# Patient Record
Sex: Male | Born: 1940 | Race: White | Hispanic: No | Marital: Married | Smoking: Former smoker
Health system: Southern US, Community
[De-identification: ages and names within clinical notes are randomized; demographics above are authoritative.]

## PROBLEM LIST (undated history)

## (undated) DIAGNOSIS — I10 Essential (primary) hypertension: Secondary | ICD-10-CM

## (undated) DIAGNOSIS — F419 Anxiety disorder, unspecified: Secondary | ICD-10-CM

## (undated) DIAGNOSIS — K219 Gastro-esophageal reflux disease without esophagitis: Secondary | ICD-10-CM

## (undated) DIAGNOSIS — M1990 Unspecified osteoarthritis, unspecified site: Secondary | ICD-10-CM

## (undated) DIAGNOSIS — T884XXA Failed or difficult intubation, initial encounter: Secondary | ICD-10-CM

## (undated) DIAGNOSIS — K635 Polyp of colon: Secondary | ICD-10-CM

## (undated) DIAGNOSIS — E119 Type 2 diabetes mellitus without complications: Secondary | ICD-10-CM

## (undated) DIAGNOSIS — G4730 Sleep apnea, unspecified: Secondary | ICD-10-CM

## (undated) DIAGNOSIS — E785 Hyperlipidemia, unspecified: Secondary | ICD-10-CM

## (undated) DIAGNOSIS — F329 Major depressive disorder, single episode, unspecified: Secondary | ICD-10-CM

## (undated) DIAGNOSIS — E7800 Pure hypercholesterolemia, unspecified: Secondary | ICD-10-CM

## (undated) DIAGNOSIS — J449 Chronic obstructive pulmonary disease, unspecified: Secondary | ICD-10-CM

## (undated) DIAGNOSIS — J45909 Unspecified asthma, uncomplicated: Secondary | ICD-10-CM

## (undated) DIAGNOSIS — F32A Depression, unspecified: Secondary | ICD-10-CM

## (undated) HISTORY — PX: TONSILLECTOMY: SUR1361

## (undated) HISTORY — PX: BRONCHOSCOPY: SUR163

## (undated) HISTORY — PX: ROTATOR CUFF REPAIR: SHX139

## (undated) HISTORY — DX: Major depressive disorder, single episode, unspecified: F32.9

## (undated) HISTORY — DX: Anxiety disorder, unspecified: F41.9

## (undated) HISTORY — PX: COLOSTOMY: SHX63

## (undated) HISTORY — DX: Unspecified osteoarthritis, unspecified site: M19.90

## (undated) HISTORY — DX: Depression, unspecified: F32.A

## (undated) HISTORY — PX: CARDIAC CATHETERIZATION: SHX172

---
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
## 2009-11-07 ENCOUNTER — Ambulatory Visit: Payer: Self-pay | Admitting: Family Medicine

## 2011-12-14 DIAGNOSIS — J42 Unspecified chronic bronchitis: Secondary | ICD-10-CM | POA: Insufficient documentation

## 2013-06-14 DIAGNOSIS — J441 Chronic obstructive pulmonary disease with (acute) exacerbation: Secondary | ICD-10-CM | POA: Insufficient documentation

## 2013-12-26 HISTORY — PX: THUMB ARTHROSCOPY: SHX2509

## 2014-09-17 ENCOUNTER — Ambulatory Visit: Payer: Self-pay | Admitting: Ophthalmology

## 2014-10-08 ENCOUNTER — Ambulatory Visit: Payer: Self-pay | Admitting: Ophthalmology

## 2015-02-13 DIAGNOSIS — G4730 Sleep apnea, unspecified: Secondary | ICD-10-CM

## 2015-02-13 DIAGNOSIS — G4733 Obstructive sleep apnea (adult) (pediatric): Secondary | ICD-10-CM | POA: Insufficient documentation

## 2015-02-17 ENCOUNTER — Ambulatory Visit: Payer: Self-pay | Admitting: Internal Medicine

## 2015-06-24 ENCOUNTER — Encounter: Payer: Self-pay | Admitting: *Deleted

## 2015-06-24 ENCOUNTER — Emergency Department: Payer: Medicare Other

## 2015-06-24 ENCOUNTER — Inpatient Hospital Stay: Payer: Medicare Other

## 2015-06-24 ENCOUNTER — Inpatient Hospital Stay
Admission: EM | Admit: 2015-06-24 | Discharge: 2015-06-28 | DRG: 190 | Disposition: A | Discharge status: Home / Self Care | Payer: Medicare Other | Attending: Internal Medicine | Admitting: Internal Medicine

## 2015-06-24 DIAGNOSIS — R0600 Dyspnea, unspecified: Secondary | ICD-10-CM

## 2015-06-24 DIAGNOSIS — R079 Chest pain, unspecified: Secondary | ICD-10-CM | POA: Diagnosis present

## 2015-06-24 DIAGNOSIS — E780 Pure hypercholesterolemia: Secondary | ICD-10-CM | POA: Diagnosis present

## 2015-06-24 DIAGNOSIS — R000 Tachycardia, unspecified: Secondary | ICD-10-CM

## 2015-06-24 DIAGNOSIS — Z882 Allergy status to sulfonamides status: Secondary | ICD-10-CM | POA: Diagnosis not present

## 2015-06-24 DIAGNOSIS — E785 Hyperlipidemia, unspecified: Secondary | ICD-10-CM | POA: Diagnosis present

## 2015-06-24 DIAGNOSIS — Z87891 Personal history of nicotine dependence: Secondary | ICD-10-CM | POA: Diagnosis not present

## 2015-06-24 DIAGNOSIS — Z7982 Long term (current) use of aspirin: Secondary | ICD-10-CM

## 2015-06-24 DIAGNOSIS — J441 Chronic obstructive pulmonary disease with (acute) exacerbation: Principal | ICD-10-CM | POA: Diagnosis present

## 2015-06-24 DIAGNOSIS — J45909 Unspecified asthma, uncomplicated: Secondary | ICD-10-CM | POA: Diagnosis present

## 2015-06-24 DIAGNOSIS — K219 Gastro-esophageal reflux disease without esophagitis: Secondary | ICD-10-CM | POA: Diagnosis present

## 2015-06-24 DIAGNOSIS — J9600 Acute respiratory failure, unspecified whether with hypoxia or hypercapnia: Secondary | ICD-10-CM | POA: Diagnosis present

## 2015-06-24 DIAGNOSIS — R0602 Shortness of breath: Secondary | ICD-10-CM

## 2015-06-24 DIAGNOSIS — I10 Essential (primary) hypertension: Secondary | ICD-10-CM | POA: Diagnosis present

## 2015-06-24 DIAGNOSIS — E119 Type 2 diabetes mellitus without complications: Secondary | ICD-10-CM | POA: Diagnosis present

## 2015-06-24 HISTORY — DX: Unspecified asthma, uncomplicated: J45.909

## 2015-06-24 HISTORY — DX: Pure hypercholesterolemia, unspecified: E78.00

## 2015-06-24 HISTORY — DX: Type 2 diabetes mellitus without complications: E11.9

## 2015-06-24 HISTORY — DX: Chronic obstructive pulmonary disease, unspecified: J44.9

## 2015-06-24 HISTORY — DX: Hyperlipidemia, unspecified: E78.5

## 2015-06-24 HISTORY — DX: Gastro-esophageal reflux disease without esophagitis: K21.9

## 2015-06-24 HISTORY — DX: Essential (primary) hypertension: I10

## 2015-06-24 LAB — BASIC METABOLIC PANEL
Anion gap: 11 (ref 5–15)
BUN: 16 mg/dL (ref 6–20)
CO2: 24 mmol/L (ref 22–32)
Calcium: 8.4 mg/dL — ABNORMAL LOW (ref 8.9–10.3)
Chloride: 106 mmol/L (ref 101–111)
Creatinine, Ser: 1.29 mg/dL — ABNORMAL HIGH (ref 0.61–1.24)
GFR, EST NON AFRICAN AMERICAN: 54 mL/min — AB (ref >60)
Glucose, Bld: 101 mg/dL — ABNORMAL HIGH (ref 65–99)
POTASSIUM: 3.5 mmol/L (ref 3.5–5.1)
SODIUM: 141 mmol/L (ref 135–145)

## 2015-06-24 LAB — CBC
HEMATOCRIT: 41.1 % (ref 40.0–52.0)
HEMOGLOBIN: 13.8 g/dL (ref 13.0–18.0)
MCH: 30.5 pg (ref 26.0–34.0)
MCHC: 33.5 g/dL (ref 32.0–36.0)
MCV: 91.2 fL (ref 80.0–100.0)
Platelets: 218 10*3/uL (ref 150–440)
RBC: 4.51 MIL/uL (ref 4.40–5.90)
RDW: 13.7 % (ref 11.5–14.5)
WBC: 6.3 10*3/uL (ref 3.8–10.6)

## 2015-06-24 LAB — TROPONIN I
Troponin I: <0.03 ng/mL (ref <0.031)
Troponin I: <0.03 ng/mL (ref <0.031)

## 2015-06-24 LAB — GLUCOSE, CAPILLARY: GLUCOSE-CAPILLARY: 146 mg/dL — AB (ref 65–99)

## 2015-06-24 LAB — FIBRIN DERIVATIVES D-DIMER (ARMC ONLY): FIBRIN DERIVATIVES D-DIMER (ARMC): 255 (ref 0–499)

## 2015-06-24 MED ORDER — LEVOFLOXACIN IN D5W 750 MG/150ML IV SOLN
750.0000 mg | INTRAVENOUS | Status: DC
  Administered 2015-06-25 (×2): 750 mg via INTRAVENOUS
  Filled 2015-06-24 (×3): qty 150

## 2015-06-24 MED ORDER — SODIUM CHLORIDE 0.9 % IV BOLUS (SEPSIS)
1000.0000 mL | INTRAVENOUS | Status: AC
  Administered 2015-06-24: 1000 mL via INTRAVENOUS

## 2015-06-24 MED ORDER — FAMOTIDINE 20 MG PO TABS
20.0000 mg | ORAL_TABLET | Freq: Two times a day (BID) | ORAL | Status: DC
  Administered 2015-06-24 – 2015-06-28 (×8): 20 mg via ORAL
  Filled 2015-06-24 (×8): qty 1

## 2015-06-24 MED ORDER — METHYLPREDNISOLONE SODIUM SUCC 125 MG IJ SOLR
125.0000 mg | Freq: Once | INTRAMUSCULAR | Status: AC
  Administered 2015-06-24: 125 mg via INTRAVENOUS

## 2015-06-24 MED ORDER — INSULIN ASPART 100 UNIT/ML ~~LOC~~ SOLN
0.0000 [IU] | Freq: Every day | SUBCUTANEOUS | Status: DC
  Administered 2015-06-25: 4 [IU] via SUBCUTANEOUS
  Filled 2015-06-24: qty 4

## 2015-06-24 MED ORDER — INSULIN ASPART 100 UNIT/ML ~~LOC~~ SOLN
0.0000 [IU] | Freq: Three times a day (TID) | SUBCUTANEOUS | Status: DC
  Administered 2015-06-25: 8 [IU] via SUBCUTANEOUS
  Administered 2015-06-25: 3 [IU] via SUBCUTANEOUS
  Administered 2015-06-25: 5 [IU] via SUBCUTANEOUS
  Administered 2015-06-26: 11 [IU] via SUBCUTANEOUS
  Administered 2015-06-26: 3 [IU] via SUBCUTANEOUS
  Filled 2015-06-24: qty 11
  Filled 2015-06-24: qty 5
  Filled 2015-06-24: qty 3
  Filled 2015-06-24: qty 8
  Filled 2015-06-24: qty 3

## 2015-06-24 MED ORDER — IPRATROPIUM-ALBUTEROL 0.5-2.5 (3) MG/3ML IN SOLN
RESPIRATORY_TRACT | Status: AC
  Administered 2015-06-24: 3 mL via RESPIRATORY_TRACT
  Filled 2015-06-24: qty 3

## 2015-06-24 MED ORDER — GLIMEPIRIDE 2 MG PO TABS
2.0000 mg | ORAL_TABLET | Freq: Every day | ORAL | Status: DC
  Administered 2015-06-24 – 2015-06-28 (×5): 2 mg via ORAL
  Filled 2015-06-24 (×5): qty 1

## 2015-06-24 MED ORDER — ADULT MULTIVITAMIN W/MINERALS CH
1.0000 | ORAL_TABLET | Freq: Every day | ORAL | Status: DC
  Administered 2015-06-24 – 2015-06-28 (×5): 1 via ORAL
  Filled 2015-06-24 (×5): qty 1

## 2015-06-24 MED ORDER — SIMVASTATIN 20 MG PO TABS
20.0000 mg | ORAL_TABLET | Freq: Every day | ORAL | Status: DC
  Administered 2015-06-24 – 2015-06-28 (×5): 20 mg via ORAL
  Filled 2015-06-24 (×5): qty 1

## 2015-06-24 MED ORDER — POLYETHYLENE GLYCOL 3350 17 G PO PACK
17.0000 g | PACK | Freq: Every day | ORAL | Status: DC | PRN
  Administered 2015-06-26: 17 g via ORAL
  Filled 2015-06-24: qty 1

## 2015-06-24 MED ORDER — ALBUTEROL SULFATE (2.5 MG/3ML) 0.083% IN NEBU
2.5000 mg | INHALATION_SOLUTION | Freq: Four times a day (QID) | RESPIRATORY_TRACT | Status: DC | PRN
  Administered 2015-06-25: 2.5 mg via RESPIRATORY_TRACT
  Filled 2015-06-24: qty 3

## 2015-06-24 MED ORDER — HYDROCODONE-ACETAMINOPHEN 5-325 MG PO TABS: 1.0000 | ORAL_TABLET | ORAL | Status: DC | PRN

## 2015-06-24 MED ORDER — METHYLPREDNISOLONE SODIUM SUCC 125 MG IJ SOLR
INTRAMUSCULAR | Status: AC
  Filled 2015-06-24: qty 2

## 2015-06-24 MED ORDER — ASPIRIN 81 MG PO CHEW
CHEWABLE_TABLET | ORAL | Status: AC
  Filled 2015-06-24: qty 4

## 2015-06-24 MED ORDER — ACETAMINOPHEN 325 MG PO TABS
650.0000 mg | ORAL_TABLET | Freq: Four times a day (QID) | ORAL | Status: DC | PRN
Start: 2015-06-24 — End: 2015-06-28

## 2015-06-24 MED ORDER — ONDANSETRON HCL 4 MG/2ML IJ SOLN
4.0000 mg | Freq: Once | INTRAMUSCULAR | Status: AC
  Administered 2015-06-24: 4 mg via INTRAVENOUS

## 2015-06-24 MED ORDER — ASPIRIN 81 MG PO CHEW
324.0000 mg | CHEWABLE_TABLET | Freq: Once | ORAL | Status: AC
  Administered 2015-06-24: 324 mg via ORAL

## 2015-06-24 MED ORDER — GLUCOSAMINE-CHONDROITIN 500-400 MG PO TABS: 1.0000 | ORAL_TABLET | Freq: Every day | ORAL | Status: DC

## 2015-06-24 MED ORDER — LORATADINE 10 MG PO TABS
10.0000 mg | ORAL_TABLET | Freq: Every day | ORAL | Status: DC
  Administered 2015-06-24 – 2015-06-28 (×5): 10 mg via ORAL
  Filled 2015-06-24 (×5): qty 1

## 2015-06-24 MED ORDER — IPRATROPIUM-ALBUTEROL 0.5-2.5 (3) MG/3ML IN SOLN
3.0000 mL | Freq: Once | RESPIRATORY_TRACT | Status: AC
  Administered 2015-06-24: 3 mL via RESPIRATORY_TRACT

## 2015-06-24 MED ORDER — ONDANSETRON HCL 4 MG/2ML IJ SOLN: 4.0000 mg | Freq: Four times a day (QID) | INTRAMUSCULAR | Status: DC | PRN

## 2015-06-24 MED ORDER — METOPROLOL SUCCINATE ER 25 MG PO TB24
25.0000 mg | ORAL_TABLET | Freq: Every day | ORAL | Status: DC
  Administered 2015-06-24 – 2015-06-28 (×5): 25 mg via ORAL
  Filled 2015-06-24 (×5): qty 1

## 2015-06-24 MED ORDER — ACETAMINOPHEN 650 MG RE SUPP: 650.0000 mg | Freq: Four times a day (QID) | RECTAL | Status: DC | PRN

## 2015-06-24 MED ORDER — PANTOPRAZOLE SODIUM 40 MG PO TBEC
40.0000 mg | DELAYED_RELEASE_TABLET | Freq: Every day | ORAL | Status: DC
  Administered 2015-06-24 – 2015-06-28 (×5): 40 mg via ORAL
  Filled 2015-06-24 (×5): qty 1

## 2015-06-24 MED ORDER — ASPIRIN 81 MG PO CHEW
81.0000 mg | CHEWABLE_TABLET | Freq: Every day | ORAL | Status: DC
  Administered 2015-06-25 – 2015-06-28 (×4): 81 mg via ORAL
  Filled 2015-06-24 (×4): qty 1

## 2015-06-24 MED ORDER — HYDROCODONE-HOMATROPINE 5-1.5 MG/5ML PO SYRP
5.0000 mL | ORAL_SOLUTION | Freq: Four times a day (QID) | ORAL | Status: DC | PRN
  Administered 2015-06-26: 5 mL via ORAL
  Filled 2015-06-24: qty 5

## 2015-06-24 MED ORDER — TRIAMTERENE-HCTZ 37.5-25 MG PO CAPS
1.0000 | ORAL_CAPSULE | Freq: Every day | ORAL | Status: DC
  Filled 2015-06-24 (×2): qty 1

## 2015-06-24 MED ORDER — ONDANSETRON HCL 4 MG PO TABS: 4.0000 mg | ORAL_TABLET | Freq: Four times a day (QID) | ORAL | Status: DC | PRN

## 2015-06-24 MED ORDER — FLUTICASONE PROPIONATE 50 MCG/ACT NA SUSP
2.0000 | Freq: Two times a day (BID) | NASAL | Status: DC | PRN
  Filled 2015-06-24: qty 16

## 2015-06-24 MED ORDER — ONDANSETRON HCL 4 MG/2ML IJ SOLN
INTRAMUSCULAR | Status: AC
  Administered 2015-06-24: 4 mg via INTRAVENOUS
  Filled 2015-06-24: qty 2

## 2015-06-24 MED ORDER — TRIAMTERENE-HCTZ 37.5-25 MG PO TABS
1.0000 | ORAL_TABLET | Freq: Every day | ORAL | Status: DC
  Administered 2015-06-24 – 2015-06-28 (×5): 1 via ORAL
  Filled 2015-06-24 (×5): qty 1

## 2015-06-24 MED ORDER — LEVALBUTEROL HCL 1.25 MG/0.5ML IN NEBU: 1.2500 mg | INHALATION_SOLUTION | Freq: Four times a day (QID) | RESPIRATORY_TRACT | Status: DC

## 2015-06-24 MED ORDER — SODIUM CHLORIDE 0.9 % IJ SOLN: 3.0000 mL | Freq: Two times a day (BID) | INTRAMUSCULAR | Status: DC

## 2015-06-24 MED ORDER — ENOXAPARIN SODIUM 40 MG/0.4ML ~~LOC~~ SOLN
40.0000 mg | SUBCUTANEOUS | Status: DC
  Administered 2015-06-24 – 2015-06-27 (×4): 40 mg via SUBCUTANEOUS
  Filled 2015-06-24 (×4): qty 0.4

## 2015-06-24 MED ORDER — METHYLPREDNISOLONE SODIUM SUCC 40 MG IJ SOLR
40.0000 mg | Freq: Four times a day (QID) | INTRAMUSCULAR | Status: DC
  Administered 2015-06-24 – 2015-06-26 (×8): 40 mg via INTRAVENOUS
  Filled 2015-06-24 (×8): qty 1

## 2015-06-24 MED ORDER — METHYLPREDNISOLONE SODIUM SUCC 125 MG IJ SOLR
INTRAMUSCULAR | Status: AC
  Administered 2015-06-24: 125 mg via INTRAVENOUS
  Filled 2015-06-24: qty 2

## 2015-06-24 MED ORDER — SODIUM CHLORIDE 0.9 % IJ SOLN
3.0000 mL | INTRAMUSCULAR | Status: DC | PRN
  Administered 2015-06-25: 3 mL via INTRAVENOUS
  Filled 2015-06-24: qty 10

## 2015-06-24 MED ORDER — ALBUTEROL SULFATE (2.5 MG/3ML) 0.083% IN NEBU: 2.5000 mg | INHALATION_SOLUTION | Freq: Four times a day (QID) | RESPIRATORY_TRACT | Status: DC | PRN

## 2015-06-24 NOTE — Care Management (Signed)
Medicare observation letter reviewed and signed by patient in anticipation of observation admission.  Patient to be admitted.  Awaiting admitting MD assessment to determine inpatient versus observation. Signed copy sent to medical records

## 2015-06-24 NOTE — ED Notes (Signed)
Dr. Verdell Carmine, admitting MD is aware pts HR.  No additional orders at this time

## 2015-06-24 NOTE — ED Notes (Signed)
Report given to [facility], Rn accepting pt on 2A

## 2015-06-24 NOTE — ED Notes (Signed)
Pt was seen last Monday at [facility] for shortness of breath, pt given prednisone and antibiotic, pt reports increased shortness of breath today with wheezing

## 2015-06-24 NOTE — ED Provider Notes (Signed)
[facility] Emergency Department Provider Note   ____________________________________________  Time seen: 1250  I have reviewed the triage vital signs and the nursing notes.   HISTORY  Chief Complaint Shortness of Breath   History limited by: Not Limited   HPI Brett Blake is a 74 y.o. male who presents to the emergency department todayFor shortness of breath. The patient states that this is been going on for the past number of days. Was seen at [facility] note a clinic for this and was given steroids and antibiotics. Patient states that he has continued to have the symptoms. He has been using his inhaler with no significant relief. Patient not complaining of any significant chest pain. Patient does state he has a history of blood clots. Denies any fevers.    Past Medical History  Diagnosis Date  . COPD (chronic obstructive pulmonary disease)   . Hypertension   . Asthma   . High cholesterol   . GERD (gastroesophageal reflux disease)     There are no active problems to display for this patient.   History reviewed. No pertinent past surgical history.  No current outpatient prescriptions on file.  Allergies Ace inhibitors and Sulfa antibiotics  No family history on file.  Social History History  Substance Use Topics  . Smoking status: Former Research scientist (life sciences)  . Smokeless tobacco: Not on file  . Alcohol Use: 0.6 - 1.2 oz/week    1-2 Shots of liquor per week    Review of Systems  Constitutional: Negative for fever. Cardiovascular: Negative for chest pain. Respiratory: positive for shortness of breath. Gastrointestinal: Negative for abdominal pain, vomiting and diarrhea. Genitourinary: Negative for dysuria. Musculoskeletal: Negative for back pain. Skin: Negative for rash. Neurological: Negative for headaches, focal weakness or numbness.   10-point ROS otherwise negative.  ____________________________________________   PHYSICAL  EXAM:  VITAL SIGNS: ED Triage Vitals  Enc Vitals Group     BP 06/24/15 1117 131/91 mmHg     Pulse Rate 06/24/15 1117 112     Resp --      Temp 06/24/15 1117 97.6 F (36.4 C)     Temp Source 06/24/15 1117 Oral     SpO2 06/24/15 1117 97 %     Weight 06/24/15 1117 200 lb (90.719 kg)     Height 06/24/15 1117 5\' 6"  (1.676 m)     Head Cir --      Peak Flow --      Pain Score 06/24/15 1135 5   Constitutional: Alert and oriented. Mild respiratory distress Eyes: Conjunctivae are normal. PERRL. Normal extraocular movements. ENT   Head: Normocephalic and atraumatic.   Nose: No congestion/rhinnorhea.   Mouth/Throat: Mucous membranes are moist.   Neck: No stridor. Hematological/Lymphatic/Immunilogical: No cervical lymphadenopathy. Cardiovascular: tachycardic, regular rhythm.  No murmurs, rubs, or gallops. Respiratory: mild respiratory distress with bilateral diffuse wheezing Gastrointestinal: Soft and nontender. No distention.  Genitourinary: Deferred Musculoskeletal: Normal range of motion in all extremities. No joint effusions.  No lower extremity tenderness nor edema. Neurologic:  Normal speech and language. No gross focal neurologic deficits are appreciated. Speech is normal.  Skin:  Skin is warm, dry and intact. No rash noted. Psychiatric: Mood and affect are normal. Speech and behavior are normal. Patient exhibits appropriate insight and judgment.  ____________________________________________   EKG  I, Nance Pear, attending physician, personally viewed and interpreted this EKG  EKG Time: 1125 Rate: 119 Rhythm: Sinus tachycardia Axis: left axis deviation Intervals: qtc 441 QRS: narrow ST changes: no  st elevation    ____________________________________________    RADIOLOGY  CXR  IMPRESSION: No active cardiopulmonary disease.  ____________________________________________   PROCEDURES  Procedure(s) performed: None  Critical Care performed:  No  ____________________________________________   INITIAL IMPRESSION / ASSESSMENT AND PLAN / ED COURSE  Pertinent labs & imaging results that were available during my care of the patient were reviewed by me and considered in my medical decision making (see chart for details).  Patient presents with continued shortness of breath after being on outpatient steroids and antibiotics. On exam patient in mild respiratory distress. Will attempt, and reassess. Additionally will get a d-dimer given history of blood clots and tachycardia.  ____________________________________________   FINAL CLINICAL IMPRESSION(S) / ED DIAGNOSES  Final diagnoses:  Dyspnea  Tachycardia  Left sided chest pain  COPD exacerbation     Nance Pear, MD 06/25/15 [phone]

## 2015-06-24 NOTE — ED Provider Notes (Addendum)
-----------------------------------------   4:00 PM on 06/24/2015 -----------------------------------------  Assuming care from Dr. Archie Balboa.  In short, Brett Blake is a 74 y.o. male with a chief complaint of Shortness of Breath .  Refer to the original H&P for additional details.  The current plan of care is to provide additional DuoNeb treatment, Solu-Medrol 125 mg IV, and check a d-dimer.  If the d-dimer is elevated, the patient will need a CT angiography of the chest to rule out pulmonary embolism.  ----------------------------------------- 6:34 PM on 06/24/2015 -----------------------------------------  I reassessed the patient and he is ill-appearing.  He is tachycardic in the 140s although it has been about an hour since he last finished the albuterol.  He states that he feels no better and is having persistent left-sided chest pain.  He had a negative d-dimer which puts him at very low risk of PE, I am concerned about the persistent chest pain as well as the tachycardia and tachypnea and subjective dyspnea.  I will provide IV fluids and discuss the case with the hospitalist.  ----------------------------------------- 6:49 PM on 06/24/2015 -----------------------------------------  I discussed case with the hospitalist who will arrange the hospital for chest pain observation and further management of his dyspnea.  We have treated the patient for COPD with Solu-Medrol 125 mg and given nebs 3.  I suspect that is COPD exacerbation is causing his persistent dyspnea, but the hospitalist will also further evaluate his chest pain.   Hinda Kehr, MD 06/24/15 1849  Hinda Kehr, MD 06/25/15 2051

## 2015-06-24 NOTE — H&P (Signed)
[place] [facility] [place] NAME: Brett Blake    MR#:  [id number]  DATE OF BIRTH:  1941-09-04  DATE OF ADMISSION:  06/24/2015  PRIMARY CARE PHYSICIAN: [name]., MD   REQUESTING/REFERRING PHYSICIAN: Dr. Hinda Kehr  HyperlCHIEF COMPLAINT:   Chief Complaint  Patient presents with  . Shortness of Breath   shortness of breath, chest pain.  HISTORY OF PRESENT ILLNESS:  Brett Blake  is a 74 y.o. male with a known history of COPD, hypertension, hyperlipidemia, diabetes, GERD, who presented to the hospital due to worsening shortness of breath and chest tightness over the past week. Patient was seen in urgent care a few days back and started on oral prednisone taper and oral Cefdinir but the patient's symptoms have not improved. Patient woke up at 4 AM was having significant left-sided chest pain and shortness of breath which he attributed to his COPD and patient took one of his nebulizer treatments but it did not improve his symptoms. Patient then took another nebulizer treatment this morning without much improvement in his symptoms. He therefore came to the ER for further evaluation in the emergency room patient received 2 DuoNeb's and a bolus of IV Solu-Medrol but still continues to be tachycardic and has significant wheezing and bronchospasm. He also continues of a pleuritic type chest pain on her left side. Hospitalist services were contacted for further treatment and evaluation. Patient does admit to a cough which is nonproductive but no recent sick contacts and no other associated symptoms presently.  PAST MEDICAL HISTORY:   Past Medical History  Diagnosis Date  . COPD (chronic obstructive pulmonary disease)   . Hypertension   . Asthma   . High cholesterol   . GERD (gastroesophageal reflux disease)   . Diabetes   . Hyperlipidemia     PAST SURGICAL HISTORY:  History reviewed. No pertinent past surgical history.  SOCIAL HISTORY:    History  Substance Use Topics  . Smoking status: Former Smoker -- 1.00 packs/day for 15 years  . Smokeless tobacco: Not on file  . Alcohol Use: 0.6 - 1.2 oz/week    1-2 Shots of liquor per week    FAMILY HISTORY:   Family History  Problem Relation Age of Onset  . Heart attack Mother   . Prostate cancer Father   . Leukemia Brother     DRUG ALLERGIES:   Allergies  Allergen Reactions  . Ace Inhibitors Cough  . Sulfa Antibiotics Rash    REVIEW OF SYSTEMS:   Review of Systems  Constitutional: Negative for fever and weight loss.  HENT: Negative for congestion, nosebleeds and tinnitus.   Eyes: Negative for blurred vision, double vision and redness.  Respiratory: Positive for cough, sputum production, shortness of breath and wheezing. Negative for hemoptysis.   Cardiovascular: Negative for chest pain, orthopnea, leg swelling and PND.  Gastrointestinal: Negative for nausea, vomiting, abdominal pain, diarrhea and melena.  Genitourinary: Negative for dysuria, urgency and hematuria.  Musculoskeletal: Negative for joint pain and falls.  Skin: Negative for rash.  Neurological: Negative for dizziness, tingling, sensory change, focal weakness, seizures, weakness and headaches.  Endo/Heme/Allergies: Negative for polydipsia. Does not bruise/bleed easily.  Psychiatric/Behavioral: Negative for depression and memory loss. The patient is not nervous/anxious.     MEDICATIONS AT HOME:   Prior to Admission medications   Medication Sig Start Date End Date Taking? Authorizing Provider  albuterol (PROVENTIL HFA;VENTOLIN HFA) 108 (90 BASE) MCG/ACT inhaler Inhale 2 puffs into the  lungs every 6 (six) hours as needed for wheezing or shortness of breath.   Yes Historical Provider, MD  albuterol (PROVENTIL) (2.5 MG/3ML) 0.083% nebulizer solution Take 2.5 mg by nebulization every 6 (six) hours as needed for wheezing or shortness of breath.   Yes Historical Provider, MD  aspirin 81 MG chewable tablet  Chew 81 mg by mouth daily.   Yes Historical Provider, MD  cefdinir (OMNICEF) 300 MG capsule Take 300 mg by mouth 2 (two) times daily. 06/22/15 07/02/15 Yes Historical Provider, MD  esomeprazole (NEXIUM) 40 MG capsule Take 40 mg by mouth daily at 12 noon.   Yes Historical Provider, MD  fexofenadine (ALLEGRA) 180 MG tablet Take 180 mg by mouth daily as needed for allergies.   Yes Historical Provider, MD  fluticasone (FLONASE) 50 MCG/ACT nasal spray Place 2 sprays into both nostrils 2 (two) times daily as needed for rhinitis.   Yes Historical Provider, MD  glimepiride (AMARYL) 1 MG tablet Take 2 mg by mouth daily.   Yes Historical Provider, MD  glucosamine-chondroitin 500-400 MG tablet Take 1 tablet by mouth daily.   Yes Historical Provider, MD  HYDROcodone-homatropine (HYCODAN) 5-1.5 MG/5ML syrup Take 5 mLs by mouth every 6 (six) hours as needed for cough.   Yes Historical Provider, MD  metoprolol succinate (TOPROL-XL) 25 MG 24 hr tablet Take 25 mg by mouth daily.   Yes Historical Provider, MD  Multiple Vitamins-Minerals (MULTIVITAMIN PO) Take 1 tablet by mouth daily.   Yes Historical Provider, MD  polyethylene glycol (MIRALAX / GLYCOLAX) packet Take 17 g by mouth daily as needed for mild constipation.   Yes Historical Provider, MD  predniSONE (DELTASONE) 5 MG tablet Take 5-30 mg by mouth See admin instructions. Pt takes as a taper:    6 tablets on day 1 5 tablets on day 2  4 tablets on day 3  3 tablets on day 4  2 tablets on day 5 1 tablet on day 6  Then Stop..   Yes Historical Provider, MD  ranitidine (ZANTAC) 150 MG tablet Take 150 mg by mouth at bedtime as needed for heartburn.   Yes Historical Provider, MD  simvastatin (ZOCOR) 20 MG tablet Take 20 mg by mouth daily.   Yes Historical Provider, MD  triamterene-hydrochlorothiazide (DYAZIDE) 37.5-25 MG per capsule Take 1 capsule by mouth daily.   Yes Historical Provider, MD      VITAL SIGNS:  Blood pressure 139/72, pulse 145, temperature 98.2  F (36.8 C), temperature source Oral, resp. rate 24, height 5\' 6"  (1.676 m), weight 90.719 kg (200 lb), SpO2 98 %.  PHYSICAL EXAMINATION:  Physical Exam  GENERAL:  74 y.o.-year-old patient lying in the bed in mild respiratory distress.  EYES: Pupils equal, round, reactive to light and accommodation. No scleral icterus. Extraocular muscles intact.  HEENT: Head atraumatic, normocephalic. Oropharynx and nasopharynx clear. No oropharyngeal erythema, moist oral mucosa  NECK:  Supple, no jugular venous distention. No thyroid enlargement, no tenderness.  LUNGS: Diffuse wheezing/rhonchi bilaterally. No dullness to percussion. No use of accessory muscles of respiration.  CARDIOVASCULAR: S1, S2 RRR, tachycardic. No murmurs, rubs, gallops, clicks.  ABDOMEN: Soft, nontender, nondistended. Bowel sounds present. No organomegaly or mass.  EXTREMITIES: No pedal edema, cyanosis, or clubbing. + 2 pedal & radial pulses b/l.   NEUROLOGIC: Cranial nerves II through XII are intact. No focal Motor or sensory deficits appreciated b/l PSYCHIATRIC: The patient is alert and oriented x 3. Good affect.  SKIN: No obvious rash, lesion, or ulcer.  LABORATORY PANEL:   CBC  Recent Labs Lab 06/24/15 1142  WBC 6.3  HGB 13.8  HCT 41.1  PLT 218   ------------------------------------------------------------------------------------------------------------------  Chemistries   Recent Labs Lab 06/24/15 1142  NA 141  K 3.5  CL 106  CO2 24  GLUCOSE 101*  BUN 16  CREATININE 1.29*  CALCIUM 8.4*   ------------------------------------------------------------------------------------------------------------------  Cardiac Enzymes  Recent Labs Lab 06/24/15 1142  TROPONINI <0.03   ------------------------------------------------------------------------------------------------------------------  RADIOLOGY:  Dg Chest 2 View (if Patient Has Fever And/or Copd)  06/24/2015   CLINICAL DATA:  Cough, congestion,  fever.  Shortness of breath.  EXAM: CHEST  2 VIEW  COMPARISON:  CT 02/17/2015  FINDINGS: The heart size and mediastinal contours are within normal limits. Both lungs are clear. The visualized skeletal structures are unremarkable.  IMPRESSION: No active cardiopulmonary disease.   Electronically Signed   By: Rolm Baptise M.D.   On: 06/24/2015 12:11     IMPRESSION AND PLAN:   74 year old male with past medical history of hypertension, hyperlipidemia, COPD, GERD, diabetes, who presented to the hospital due to shortness of breath cough and pleuritic left-sided chest pain and noted to be in COPD exacerbation.  #1 COPD exacerbation-this is likely the cause of patient's shortness of breath and chest pain. -Patient has failed outpatient therapy with oral prednisone and antibiotics. -We'll admit the patient start patient on IV steroids, IV antibiotics with Levaquin. We'll continue around-the-clock Xopenex. -We'll start some Dulera, Spiriva. -Assess the patient for home oxygen prior to discharge.  #2 chest pain-patient's pain is very atypical and likely noncardiac in nature. Probably related to COPD. -Patient did have a d-dimer which was negative and therefore this is unlikely a pulmonary embolus. -We'll observe on telemetry, follow serial cardiac markers, we'll get a CT chest noncontrast.  #3 diabetes type 2 - we will continue glipizide, add sliding scale insulin coverage as patient is going to be on IV steroids. -Follow blood sugars.  #4 GERD-continue Protonix, Pepcid.  #5 hypertension-continue Toprol, triamterene-HCTZ.  #6 hyperlipidemia-continue simvastatin.    All the records are reviewed and case discussed with ED provider. Management plans discussed with the patient, family and they are in agreement.  CODE STATUS: Full  TOTAL TIME TAKING CARE OF THIS PATIENT: 45 minutes.    Henreitta Leber M.D on 06/24/2015 at 7:29 PM  Between 7am to 6pm - Pager - [phone]  After 6pm go to  [email or web address] - password [email or web address] [facility]  [place] [facility]  Office  [phone]  CC: Primary care physician; [name]., MD

## 2015-06-24 NOTE — ED Notes (Signed)
Patient resting in stretcher. Respirations even and unlabored. No obvious distress. Cardiac monitor in place. No needs/concerns verbalized at this time. Call bell within reach. Encouraged to call with needs. Will continue to monitor. 

## 2015-06-25 LAB — CBC
HCT: 42.8 % (ref 40.0–52.0)
Hemoglobin: 14.2 g/dL (ref 13.0–18.0)
MCH: 30.4 pg (ref 26.0–34.0)
MCHC: 33.2 g/dL (ref 32.0–36.0)
MCV: 91.4 fL (ref 80.0–100.0)
PLATELETS: 257 10*3/uL (ref 150–440)
RBC: 4.68 MIL/uL (ref 4.40–5.90)
RDW: 13.8 % (ref 11.5–14.5)
WBC: 7.1 10*3/uL (ref 3.8–10.6)

## 2015-06-25 LAB — BASIC METABOLIC PANEL
ANION GAP: 13 (ref 5–15)
BUN: 22 mg/dL — AB (ref 6–20)
CO2: 23 mmol/L (ref 22–32)
CREATININE: 1.3 mg/dL — AB (ref 0.61–1.24)
Calcium: 8.9 mg/dL (ref 8.9–10.3)
Chloride: 101 mmol/L (ref 101–111)
GFR calc Af Amer: >60 mL/min (ref >60)
GFR calc non Af Amer: 53 mL/min — ABNORMAL LOW (ref >60)
GLUCOSE: 203 mg/dL — AB (ref 65–99)
POTASSIUM: 4.3 mmol/L (ref 3.5–5.1)
Sodium: 137 mmol/L (ref 135–145)

## 2015-06-25 LAB — TROPONIN I: Troponin I: <0.03 ng/mL (ref <0.031)

## 2015-06-25 LAB — GLUCOSE, CAPILLARY
GLUCOSE-CAPILLARY: 168 mg/dL — AB (ref 65–99)
GLUCOSE-CAPILLARY: 224 mg/dL — AB (ref 65–99)
GLUCOSE-CAPILLARY: 343 mg/dL — AB (ref 65–99)
Glucose-Capillary: 287 mg/dL — ABNORMAL HIGH (ref 65–99)

## 2015-06-25 MED ORDER — LEVALBUTEROL HCL 1.25 MG/0.5ML IN NEBU
1.2500 mg | INHALATION_SOLUTION | Freq: Four times a day (QID) | RESPIRATORY_TRACT | Status: DC
  Administered 2015-06-25 – 2015-06-28 (×14): 1.25 mg via RESPIRATORY_TRACT
  Filled 2015-06-25 (×14): qty 0.5

## 2015-06-25 MED ORDER — AMLODIPINE BESYLATE 5 MG PO TABS
5.0000 mg | ORAL_TABLET | Freq: Every day | ORAL | Status: DC
Start: 2015-06-25 — End: 2015-06-28
  Administered 2015-06-25 – 2015-06-28 (×4): 5 mg via ORAL
  Filled 2015-06-25 (×4): qty 1

## 2015-06-25 MED ORDER — CEFTRIAXONE SODIUM IN DEXTROSE 20 MG/ML IV SOLN
1.0000 g | INTRAVENOUS | Status: DC
  Administered 2015-06-25 – 2015-06-26 (×2): 1 g via INTRAVENOUS
  Filled 2015-06-25 (×3): qty 50

## 2015-06-25 MED ORDER — ZOLPIDEM TARTRATE 5 MG PO TABS
5.0000 mg | ORAL_TABLET | Freq: Every evening | ORAL | Status: DC | PRN
  Administered 2015-06-25 – 2015-06-26 (×2): 5 mg via ORAL
  Filled 2015-06-25 (×2): qty 1

## 2015-06-25 NOTE — Progress Notes (Signed)
Inpatient Diabetes Program Recommendations  AACE/ADA: New Consensus Statement on Inpatient Glycemic Control (2013)  Target Ranges:  Prepandial:   less than 140 mg/dL      Peak postprandial:   less than 180 mg/dL (1-2 hours)      Critically ill patients:  140 - 180 mg/dL   Reason for assessment: elevated CBG  Diabetes history: Type 2 Outpatient Diabetes medications: Amaryl 2mg /day Current orders for Inpatient glycemic control: Amaryl 2mg /day, Novolog moderate correction scale 0-15 units tid and 0-5 units qhs  Please consider checking A1C to determine blood sugar control over the past 2-3 months (10/2014 A1C 5.8%)   Consider increasing Novolog correction scale to Novolog resistant correction 0-20 units tid since patient is on steroids and post prandial blood sugars are elevated with current dosing.     Gentry Fitz, RN, BA, MHA, CDE Diabetes Coordinator Inpatient Diabetes Program  [phone] (Team Pager) [phone] ([place]) 06/25/2015 11:57 AM

## 2015-06-25 NOTE — Progress Notes (Signed)
Patient requesting sleep aide. States he has not slept in a couple of days. Has taken Ambien 5mg  in the past. Dr. Caryl Comes on call and gave verbal order over phone. Will notify night RN to give Ambien tonight.

## 2015-06-25 NOTE — Progress Notes (Signed)
Brett Blake is a 74 y.o. male   SUBJECTIVE:  Patient admitted with acute respiratory failure due to COPD flare. Chest CT shows no pulmonary embolus nor infiltrate. Patient with significant extra wheezing and dyspnea on exertion this morning. No productive sputum.  ______________________________________________________________________  ROS: Review of systems is unremarkable for any active cardiac,respiratory, GI, GU, hematologic, neurologic or psychiatric systems, 10 systems reviewed.  Marland Kitchen amLODipine  5 mg Oral Daily  . aspirin  81 mg Oral Daily  . cefTRIAXone (ROCEPHIN)  IV  1 g Intravenous Q24H  . enoxaparin (LOVENOX) injection  40 mg Subcutaneous Q24H  . famotidine  20 mg Oral BID  . glimepiride  2 mg Oral Daily  . insulin aspart  0-15 Units Subcutaneous TID WC  . insulin aspart  0-5 Units Subcutaneous QHS  . levalbuterol  1.25 mg Nebulization QID  . levofloxacin (LEVAQUIN) IV  750 mg Intravenous Q24H  . loratadine  10 mg Oral Daily  . methylPREDNISolone (SOLU-MEDROL) injection  40 mg Intravenous Q6H  . metoprolol succinate  25 mg Oral Daily  . multivitamin with minerals  1 tablet Oral Daily  . pantoprazole  40 mg Oral Daily  . simvastatin  20 mg Oral Daily  . triamterene-hydrochlorothiazide  1 tablet Oral Daily   acetaminophen **OR** acetaminophen, albuterol, fluticasone, HYDROcodone-acetaminophen, HYDROcodone-homatropine, ondansetron **OR** ondansetron (ZOFRAN) IV, polyethylene glycol, sodium chloride   Past Medical History  Diagnosis Date  . COPD (chronic obstructive pulmonary disease)   . Hypertension   . Asthma   . High cholesterol   . GERD (gastroesophageal reflux disease)   . Diabetes   . Hyperlipidemia     History reviewed. No pertinent past surgical history.  PHYSICAL EXAM:  BP 151/96 mmHg  Pulse 94  Temp(Src) 97.7 F (36.5 C) (Oral)  Resp 18  Ht 5\' 6"  (1.676 m)  Wt 94.484 kg (208 lb 4.8 oz)  BMI 33.64 kg/m2  SpO2 98%  Wt Readings from Last 3  Encounters:  06/24/15 94.484 kg (208 lb 4.8 oz)           BP Readings from Last 3 Encounters:  06/25/15 151/96    Constitutional: NAD Neck: supple, no thyromegaly Respiratory: Diffuse expiratory wheezing, mostly anterior Cardiovascular: RRR, no murmur, no gallop Abdomen: soft, good BS, nontender Extremities: no edema Neuro: alert and oriented, no focal motor or sensory deficits  ASSESSMENT/PLAN:  Labs and imaging studies were reviewed  Acute respiratory failure-due to COPD flare, no pneumonia. Add IV Rocephin to IV Levaquin, continue steroid-induced and DuoNeb's, wean O2 as able Diabetes mellitus-continue oral hypoglycemic agent with sliding scale insulin in light of the IV Solu-Medrol Hypertension-blood pressures up, may need some diuresis with sterile, add amlodipine for now Likely home Saturday

## 2015-06-25 NOTE — Care Management (Signed)
Patient admitted from home with exac of COPD.  Patient  lives with his wife and is independent in all adls.  Denies issues accessing medical care, meds, housing, transportation, utilities or food.  Patient says he has not smoked in over 50 years and feels his chronic lung problems are from  an allergy to ACE inhibitors.  He does not have pulmonologist. Patient sought medical attention at onset of sx and failed outpatient treatment. Has a home nebulizer- no home 02.  Discussed the need to assess need for home 02 during progression.

## 2015-06-25 NOTE — Progress Notes (Signed)
Alert and oriented. No complaints other than mild chest discomfort on inspiration 3/10. IV solumedrol, antibiotics given. Blood sugars elevated, diabetes coordinator recommends stricter sliding scale due to steroids. Sinus rhythm to sinus tach in the 1-teens on tele. On room air oxygen saturation maintained at 97%, removed 2L of oxygen. Patient instructed he can wear it PRN if he feels he needs it. Will continue to monitor.

## 2015-06-26 LAB — GLUCOSE, CAPILLARY
GLUCOSE-CAPILLARY: 167 mg/dL — AB (ref 65–99)
GLUCOSE-CAPILLARY: 323 mg/dL — AB (ref 65–99)
GLUCOSE-CAPILLARY: 172 mg/dL — AB (ref 65–99)
Glucose-Capillary: 164 mg/dL — ABNORMAL HIGH (ref 65–99)

## 2015-06-26 MED ORDER — METHYLPREDNISOLONE SODIUM SUCC 40 MG IJ SOLR
40.0000 mg | Freq: Two times a day (BID) | INTRAMUSCULAR | Status: DC
  Administered 2015-06-27 – 2015-06-28 (×3): 40 mg via INTRAVENOUS
  Filled 2015-06-26 (×3): qty 1

## 2015-06-26 MED ORDER — LEVOFLOXACIN 750 MG PO TABS: 750.0000 mg | ORAL_TABLET | Freq: Every day | ORAL | Status: DC

## 2015-06-26 MED ORDER — ZOLPIDEM TARTRATE 5 MG PO TABS: 5.0000 mg | ORAL_TABLET | Freq: Every evening | ORAL | Status: DC | PRN

## 2015-06-26 MED ORDER — INSULIN ASPART 100 UNIT/ML ~~LOC~~ SOLN
0.0000 [IU] | Freq: Three times a day (TID) | SUBCUTANEOUS | Status: DC
  Administered 2015-06-26: 4 [IU] via SUBCUTANEOUS
  Administered 2015-06-27: 11 [IU] via SUBCUTANEOUS
  Administered 2015-06-27 (×2): 3 [IU] via SUBCUTANEOUS
  Administered 2015-06-28: 11 [IU] via SUBCUTANEOUS
  Administered 2015-06-28: 4 [IU] via SUBCUTANEOUS
  Filled 2015-06-26 (×2): qty 11
  Filled 2015-06-26 (×2): qty 4
  Filled 2015-06-26: qty 3

## 2015-06-26 MED ORDER — INSULIN ASPART 100 UNIT/ML ~~LOC~~ SOLN
0.0000 [IU] | Freq: Every day | SUBCUTANEOUS | Status: DC
  Administered 2015-06-27: 2 [IU] via SUBCUTANEOUS
  Filled 2015-06-26: qty 2

## 2015-06-26 MED ORDER — ZOLPIDEM TARTRATE 5 MG PO TABS
10.0000 mg | ORAL_TABLET | Freq: Every evening | ORAL | Status: DC | PRN
  Administered 2015-06-26 – 2015-06-27 (×2): 10 mg via ORAL
  Filled 2015-06-26 (×2): qty 2

## 2015-06-26 MED ORDER — ZOLPIDEM TARTRATE 5 MG PO TABS: 10.0000 mg | ORAL_TABLET | Freq: Every evening | ORAL | Status: DC | PRN

## 2015-06-26 MED ORDER — INSULIN ASPART 100 UNIT/ML ~~LOC~~ SOLN: 0.0000 [IU] | Freq: Three times a day (TID) | SUBCUTANEOUS | Status: DC

## 2015-06-26 NOTE — Progress Notes (Signed)
Pt. Requesting Ambien 10 mg to aid with sleep. States " I take up to two pills if one is not helping"  Order received from Dr. Ola Spurr for current order to be changed to 10 mg PO at bedtime.

## 2015-06-26 NOTE — Progress Notes (Signed)
Brett Blake is a 74 y.o. male   SUBJECTIVE: Patient admitted with acute respiratory failure due to COPD flare. Chest CT shows no pulmonary embolus nor infiltrate.  Improving- still very hoarse, no fevers. Up a moving ok but still sob ______________________________________________________________________  ROS: Review of systems is unremarkable for any active cardiac,respiratory, GI, GU, hematologic, neurologic or psychiatric systems, 10 systems reviewed.  Marland Kitchen amLODipine  5 mg Oral Daily  . aspirin  81 mg Oral Daily  . cefTRIAXone (ROCEPHIN)  IV  1 g Intravenous Q24H  . enoxaparin (LOVENOX) injection  40 mg Subcutaneous Q24H  . famotidine  20 mg Oral BID  . glimepiride  2 mg Oral Daily  . insulin aspart  0-15 Units Subcutaneous TID WC  . insulin aspart  0-5 Units Subcutaneous QHS  . levalbuterol  1.25 mg Nebulization QID  . levofloxacin  750 mg Oral Daily  . loratadine  10 mg Oral Daily  . methylPREDNISolone (SOLU-MEDROL) injection  40 mg Intravenous Q6H  . metoprolol succinate  25 mg Oral Daily  . multivitamin with minerals  1 tablet Oral Daily  . pantoprazole  40 mg Oral Daily  . simvastatin  20 mg Oral Daily  . triamterene-hydrochlorothiazide  1 tablet Oral Daily   acetaminophen **OR** acetaminophen, albuterol, fluticasone, HYDROcodone-acetaminophen, HYDROcodone-homatropine, ondansetron **OR** ondansetron (ZOFRAN) IV, polyethylene glycol, sodium chloride, zolpidem   Past Medical History  Diagnosis Date  . COPD (chronic obstructive pulmonary disease)   . Hypertension   . Asthma   . High cholesterol   . GERD (gastroesophageal reflux disease)   . Diabetes   . Hyperlipidemia     History reviewed. No pertinent past surgical history.  PHYSICAL EXAM:  BP 157/94 mmHg  Pulse 100  Temp(Src) 97.7 F (36.5 C) (Oral)  Resp 20  Ht 5\' 6"  (1.676 m)  Wt 94.484 kg (208 lb 4.8 oz)  BMI 33.64 kg/m2  SpO2 99%  Wt Readings from Last 3 Encounters:  06/24/15 94.484 kg (208 lb 4.8 oz)            BP Readings from Last 3 Encounters:  06/26/15 157/94    Constitutional: NAD Neck: supple, no thyromegaly Respiratory: Diffuse expiratory wheezing, mostly anterior Cardiovascular: RRR, no murmur, no gallop Abdomen: soft, good BS, nontender Extremities: no edema Neuro: alert and oriented, no focal motor or sensory deficits  CBC    Component Value Date/Time   WBC 7.1 06/25/2015 0339   RBC 4.68 06/25/2015 0339   HGB 14.2 06/25/2015 0339   HCT 42.8 06/25/2015 0339   PLT 257 06/25/2015 0339   MCV 91.4 06/25/2015 0339   MCH 30.4 06/25/2015 0339   MCHC 33.2 06/25/2015 0339   RDW 13.8 06/25/2015 0339   Dg Chest 2 View (if Patient Has Fever And/or Copd)  06/24/2015   CLINICAL DATA:  Cough, congestion, fever.  Shortness of breath.  EXAM: CHEST  2 VIEW  COMPARISON:  CT 02/17/2015  FINDINGS: The heart size and mediastinal contours are within normal limits. Both lungs are clear. The visualized skeletal structures are unremarkable.  IMPRESSION: No active cardiopulmonary disease.   Electronically Signed   By: Rolm Baptise M.D.   On: 06/24/2015 12:11   Ct Chest Wo Contrast  06/24/2015   CLINICAL DATA:  Shortness of breath, COPD, hypertension  EXAM: CT CHEST WITHOUT CONTRAST  TECHNIQUE: Multidetector CT imaging of the chest was performed following the standard protocol without IV contrast.  COMPARISON:  CT chest 02/17/2015  FINDINGS: Images of the thoracic inlet are unremarkable.  Central airways are patent. There is no mediastinal hematoma. Small hiatal hernia is noted. There is a left hilar partially calcified lymph node measures 1.2 cm stable in size from prior exam.  No adrenal gland mass is noted in visualized upper abdomen.  Sagittal images of the spine shows degenerative changes mid and lower thoracic spine. Sagittal view of the sternum is unremarkable.  Heart size within normal limits. No pericardial effusion is noted. Images of the lung parenchyma shows no acute infiltrate or pulmonary  edema. There is a calcified granuloma in left lower lobe laterally measures 9 mm stable in size in appearance from prior exam.  IMPRESSION: 1. No acute infiltrate or pulmonary edema. 2. Stable partially calcified left hilar lymph node. Stable calcified granuloma in left lower lobe. 3. No mediastinal hematoma or adenopathy. 4. Degenerative changes mid and lower thoracic spine 5. Small hiatal hernia.   Electronically Signed   By: Lahoma Crocker M.D.   On: 06/24/2015 21:10     ASSESSMENT/PLAN: Acute respiratory failure-due to COPD flare, no pneumonia.  Stop abx today  Wean  Steroid Cont  DuoNeb's,  wean O2 as able  Diabetes mellitus-continue oral hypoglycemic agent with sliding scale insulin in light of the IV Solu-Medrol - check A1c  Hypertension-blood pressures up, may need some diuresis , continue amlodipine maxzide and metoprolol  Likely home Tomorrow if stable

## 2015-06-26 NOTE — Progress Notes (Signed)
PHARMACIST - PHYSICIAN COMMUNICATION  CONCERNING: Antibiotic IV to Oral Route Change Policy  RECOMMENDATION: This patient is receiving levofloxacin by the intravenous route.  Based on criteria approved by the Pharmacy and Therapeutics Committee, the antibiotic(s) is/are being converted to the equivalent oral dose form(s).   DESCRIPTION: These criteria include:  Patient being treated for a respiratory tract infection, urinary tract infection, cellulitis or clostridium difficile associated diarrhea if on metronidazole  The patient is not neutropenic and does not exhibit a GI malabsorption state  The patient is eating (either orally or via tube) and/or has been taking other orally administered medications for a least 24 hours  The patient is improving clinically and has a Tmax < 100.5  If you have questions about this conversion, please contact the Pharmacy Department  []   [phone] )  [facility] [x]   [phone] )  [facility] []   [phone] )  [facility] []   [phone] )  [facility] []   [phone] )  [place], PharmD Clinical Pharmacist 06/26/2015

## 2015-06-26 NOTE — Progress Notes (Signed)
Inpatient Diabetes Program Recommendations  AACE/ADA: New Consensus Statement on Inpatient Glycemic Control (2013)  Target Ranges:  Prepandial:   less than 140 mg/dL      Peak postprandial:   less than 180 mg/dL (1-2 hours)      Critically ill patients:  140 - 180 mg/dL   Results for Brett Blake, Brett Blake (MRN [id number]) as of 06/26/2015 10:03  Ref. Range 06/24/2015 20:37 06/25/2015 07:26 06/25/2015 11:04 06/25/2015 16:27 06/25/2015 20:20 06/26/2015 08:20  Glucose-Capillary Latest Ref Range: 65-99 mg/dL 146 (H) 168 (H) 287 (H) 224 (H) 343 (H) 167 (H)    Diabetes history: Type 2 Outpatient Diabetes medications: Amaryl 2mg /day Current orders for Inpatient glycemic control: Amaryl 2mg /day, Novolog moderate correction scale 0-15 units tid and 0-5 units qhs  Please consider checking A1C to determine blood sugar control over the past 2-3 months (10/2014 A1C 5.8%)   Consider increasing Novolog correction scale to Novolog resistant correction 0-20 units tid since patient is on steroids and post prandial blood sugars are elevated with current dosing.   Gentry Fitz, RN, BA, MHA, CDE Diabetes Coordinator Inpatient Diabetes Program  [phone] (Team Pager) [phone] ([place]) 06/26/2015 10:04 AM

## 2015-06-26 NOTE — Progress Notes (Signed)
Pt unable to go to sleep. Pt requested Ambien twice tonight. Ambien 5 mg PRN has been administered 2 times. Prune juice an Miralax have been administered to aid with bowel movements as well.

## 2015-06-26 NOTE — Care Management (Signed)
Important Message  Patient Details  Name: Brett Blake MRN: [id number] Date of Birth: [birth year]   Medicare Important Message Given:  Yes-second notification given    Juliann Pulse A Allmond 06/26/2015, 11:01 AM

## 2015-06-27 LAB — BASIC METABOLIC PANEL
ANION GAP: 8 (ref 5–15)
BUN: 30 mg/dL — AB (ref 6–20)
CHLORIDE: 100 mmol/L — AB (ref 101–111)
CO2: 29 mmol/L (ref 22–32)
Calcium: 8.7 mg/dL — ABNORMAL LOW (ref 8.9–10.3)
Creatinine, Ser: 1.29 mg/dL — ABNORMAL HIGH (ref 0.61–1.24)
GFR, EST NON AFRICAN AMERICAN: 54 mL/min — AB (ref >60)
Glucose, Bld: 135 mg/dL — ABNORMAL HIGH (ref 65–99)
Potassium: 4.6 mmol/L (ref 3.5–5.1)
Sodium: 137 mmol/L (ref 135–145)

## 2015-06-27 LAB — HEMOGLOBIN A1C
HEMOGLOBIN A1C: 6.1 % — AB (ref 4.0–6.0)
Hgb A1c MFr Bld: 5.9 % (ref 4.0–6.0)

## 2015-06-27 LAB — GLUCOSE, CAPILLARY
Glucose-Capillary: 132 mg/dL — ABNORMAL HIGH (ref 65–99)
Glucose-Capillary: 298 mg/dL — ABNORMAL HIGH (ref 65–99)
Glucose-Capillary: 132 mg/dL — ABNORMAL HIGH (ref 65–99)
Glucose-Capillary: 212 mg/dL — ABNORMAL HIGH (ref 65–99)

## 2015-06-27 MED ORDER — AZITHROMYCIN 250 MG PO TABS
500.0000 mg | ORAL_TABLET | Freq: Every day | ORAL | Status: AC
  Administered 2015-06-27: 500 mg via ORAL
  Filled 2015-06-27: qty 2

## 2015-06-27 MED ORDER — PREDNISONE 5 MG PO TABS: 5.0000 mg | ORAL_TABLET | ORAL | Status: DC

## 2015-06-27 MED ORDER — LEVALBUTEROL HCL 1.25 MG/0.5ML IN NEBU: 1.2500 mg | INHALATION_SOLUTION | Freq: Four times a day (QID) | RESPIRATORY_TRACT | Status: DC

## 2015-06-27 MED ORDER — AMLODIPINE BESYLATE 5 MG PO TABS: 5.0000 mg | ORAL_TABLET | Freq: Every day | ORAL | Status: DC

## 2015-06-27 MED ORDER — CEFTRIAXONE SODIUM IN DEXTROSE 20 MG/ML IV SOLN
1.0000 g | INTRAVENOUS | Status: DC
  Administered 2015-06-27: 1 g via INTRAVENOUS
  Filled 2015-06-27 (×3): qty 50

## 2015-06-27 MED ORDER — AZITHROMYCIN 250 MG PO TABS
250.0000 mg | ORAL_TABLET | Freq: Every day | ORAL | Status: DC
  Administered 2015-06-28: 250 mg via ORAL
  Filled 2015-06-27: qty 1

## 2015-06-27 NOTE — Progress Notes (Signed)
Brett Blake is a 74 y.o. male   SUBJECTIVE: Patient admitted with acute respiratory failure due to COPD flare. Chest CT shows no pulmonary embolus nor infiltrate.  Improving- still very hoarse, no fevers. Up a moving ok but still sob ______________________________________________________________________  ROS: Review of systems is unremarkable for any active cardiac,respiratory, GI, GU, hematologic, neurologic or psychiatric systems, 10 systems reviewed.  Marland Kitchen amLODipine  5 mg Oral Daily  . aspirin  81 mg Oral Daily  . enoxaparin (LOVENOX) injection  40 mg Subcutaneous Q24H  . famotidine  20 mg Oral BID  . glimepiride  2 mg Oral Daily  . insulin aspart  0-20 Units Subcutaneous TID WC  . insulin aspart  0-5 Units Subcutaneous QHS  . levalbuterol  1.25 mg Nebulization QID  . loratadine  10 mg Oral Daily  . methylPREDNISolone (SOLU-MEDROL) injection  40 mg Intravenous Q12H  . metoprolol succinate  25 mg Oral Daily  . multivitamin with minerals  1 tablet Oral Daily  . pantoprazole  40 mg Oral Daily  . simvastatin  20 mg Oral Daily  . triamterene-hydrochlorothiazide  1 tablet Oral Daily   acetaminophen **OR** acetaminophen, albuterol, fluticasone, HYDROcodone-acetaminophen, HYDROcodone-homatropine, ondansetron **OR** ondansetron (ZOFRAN) IV, polyethylene glycol, sodium chloride, zolpidem   Past Medical History  Diagnosis Date  . COPD (chronic obstructive pulmonary disease)   . Hypertension   . Asthma   . High cholesterol   . GERD (gastroesophageal reflux disease)   . Diabetes   . Hyperlipidemia     History reviewed. No pertinent past surgical history.  PHYSICAL EXAM:  BP 131/77 mmHg  Pulse 83  Temp(Src) 98.1 F (36.7 C) (Oral)  Resp 20  Ht 5\' 6"  (1.676 m)  Wt 94.167 kg (207 lb 9.6 oz)  BMI 33.52 kg/m2  SpO2 97%  Wt Readings from Last 3 Encounters:  06/27/15 94.167 kg (207 lb 9.6 oz)           BP Readings from Last 3 Encounters:  06/27/15 131/77    Constitutional:  NAD Neck: supple, no thyromegaly Respiratory: Diffuse expiratory wheezing, mostly anterior Cardiovascular: RRR, no murmur, no gallop Abdomen: soft, good BS, nontender Extremities: no edema Neuro: alert and oriented, no focal motor or sensory deficits  CBC    Component Value Date/Time   WBC 7.1 06/25/2015 0339   RBC 4.68 06/25/2015 0339   HGB 14.2 06/25/2015 0339   HCT 42.8 06/25/2015 0339   PLT 257 06/25/2015 0339   MCV 91.4 06/25/2015 0339   MCH 30.4 06/25/2015 0339   MCHC 33.2 06/25/2015 0339   RDW 13.8 06/25/2015 0339   Dg Chest 2 View (if Patient Has Fever And/or Copd)  06/24/2015   CLINICAL DATA:  Cough, congestion, fever.  Shortness of breath.  EXAM: CHEST  2 VIEW  COMPARISON:  CT 02/17/2015  FINDINGS: The heart size and mediastinal contours are within normal limits. Both lungs are clear. The visualized skeletal structures are unremarkable.  IMPRESSION: No active cardiopulmonary disease.   Electronically Signed   By: Rolm Baptise M.D.   On: 06/24/2015 12:11   Ct Chest Wo Contrast  06/24/2015   CLINICAL DATA:  Shortness of breath, COPD, hypertension  EXAM: CT CHEST WITHOUT CONTRAST  TECHNIQUE: Multidetector CT imaging of the chest was performed following the standard protocol without IV contrast.  COMPARISON:  CT chest 02/17/2015  FINDINGS: Images of the thoracic inlet are unremarkable. Central airways are patent. There is no mediastinal hematoma. Small hiatal hernia is noted. There is a left hilar  partially calcified lymph node measures 1.2 cm stable in size from prior exam.  No adrenal gland mass is noted in visualized upper abdomen.  Sagittal images of the spine shows degenerative changes mid and lower thoracic spine. Sagittal view of the sternum is unremarkable.  Heart size within normal limits. No pericardial effusion is noted. Images of the lung parenchyma shows no acute infiltrate or pulmonary edema. There is a calcified granuloma in left lower lobe laterally measures 9 mm stable  in size in appearance from prior exam.  IMPRESSION: 1. No acute infiltrate or pulmonary edema. 2. Stable partially calcified left hilar lymph node. Stable calcified granuloma in left lower lobe. 3. No mediastinal hematoma or adenopathy. 4. Degenerative changes mid and lower thoracic spine 5. Small hiatal hernia.   Electronically Signed   By: Lahoma Crocker M.D.   On: 06/24/2015 21:10     ASSESSMENT/PLAN: Acute respiratory failure-due to COPD flare, no pneumonia.  Off abx Wean  Steroid Cont nebs- he requests change to xopenex as less tachycardia and jittery with this  Diabetes mellitus-resume  oral hypoglycemic agent  Fu otpt  Hypertension-blood pressures improving, , continue amlodipine maxzide and metoprolol  DC today

## 2015-06-27 NOTE — Progress Notes (Signed)
Resident refuses to wear tele monitor at this time.

## 2015-06-27 NOTE — Discharge Summary (Addendum)
Physician Discharge Summary  Patient ID: Brett Blake MRN: [id number] DOB/AGE: 1941/10/04 74 y.o.  Admit date: 06/24/2015 Discharge date: 06/28/2015  Admission Diagnoses:  Discharge Diagnoses:  Active Problems:   COPD exacerbation   Discharged Condition: fair  Hospital Course:  Acute respiratory failure-due to COPD flare,Off abx Wean  Steroids as otpt Levo x 5 more days as otpt Cont nebs- he requests change to xopenex as less tachycardia and jittery with this  Diabetes mellitus-resume  oral hypoglycemic agent. A1c 6.1 Fu otpt  Hypertension-blood pressures improving, , continue amlodipine maxzide and metoprolol  Consults: None  Treatments: Steroids, nebs, abx, O2  Discharge Exam: Blood pressure 134/83, pulse 81, temperature 97.5 F (36.4 C), temperature source Oral, resp. rate 25, height 5\' 6"  (1.676 m), weight 94.167 kg (207 lb 9.6 oz), SpO2 97 %. See PN   Dg Chest 2 View (if Patient Has Fever And/or Copd)  06/24/2015   CLINICAL DATA:  Cough, congestion, fever.  Shortness of breath.  EXAM: CHEST  2 VIEW  COMPARISON:  CT 02/17/2015  FINDINGS: The heart size and mediastinal contours are within normal limits. Both lungs are clear. The visualized skeletal structures are unremarkable.  IMPRESSION: No active cardiopulmonary disease.   Electronically Signed   By: Rolm Baptise M.D.   On: 06/24/2015 12:11   Ct Chest Wo Contrast  06/24/2015   CLINICAL DATA:  Shortness of breath, COPD, hypertension  EXAM: CT CHEST WITHOUT CONTRAST  TECHNIQUE: Multidetector CT imaging of the chest was performed following the standard protocol without IV contrast.  COMPARISON:  CT chest 02/17/2015  FINDINGS: Images of the thoracic inlet are unremarkable. Central airways are patent. There is no mediastinal hematoma. Small hiatal hernia is noted. There is a left hilar partially calcified lymph node measures 1.2 cm stable in size from prior exam.  No adrenal gland mass is noted in visualized upper abdomen.   Sagittal images of the spine shows degenerative changes mid and lower thoracic spine. Sagittal view of the sternum is unremarkable.  Heart size within normal limits. No pericardial effusion is noted. Images of the lung parenchyma shows no acute infiltrate or pulmonary edema. There is a calcified granuloma in left lower lobe laterally measures 9 mm stable in size in appearance from prior exam.  IMPRESSION: 1. No acute infiltrate or pulmonary edema. 2. Stable partially calcified left hilar lymph node. Stable calcified granuloma in left lower lobe. 3. No mediastinal hematoma or adenopathy. 4. Degenerative changes mid and lower thoracic spine 5. Small hiatal hernia.   Electronically Signed   By: Lahoma Crocker M.D.   On: 06/24/2015 21:10   Disposition: Final discharge disposition not confirmed      Discharge Instructions    Call MD for:  difficulty breathing, headache or visual disturbances    Complete by:  As directed      Call MD for:  temperature >100.4    Complete by:  As directed      Diet - low sodium heart healthy    Complete by:  As directed      Increase activity slowly    Complete by:  As directed             Medication List    STOP taking these medications        cefdinir 300 MG capsule  Commonly known as:  OMNICEF      TAKE these medications        albuterol (2.5 MG/3ML) 0.083% nebulizer solution  Commonly known as:  PROVENTIL  Take 2.5 mg by nebulization every 6 (six) hours as needed for wheezing or shortness of breath.     albuterol 108 (90 BASE) MCG/ACT inhaler  Commonly known as:  PROVENTIL HFA;VENTOLIN HFA  Inhale 2 puffs into the lungs every 6 (six) hours as needed for wheezing or shortness of breath.     amLODipine 5 MG tablet  Commonly known as:  NORVASC  Take 1 tablet (5 mg total) by mouth daily.     aspirin 81 MG chewable tablet  Chew 81 mg by mouth daily.     esomeprazole 40 MG capsule  Commonly known as:  NEXIUM  Take 40 mg by mouth daily at 12 noon.      fexofenadine 180 MG tablet  Commonly known as:  ALLEGRA  Take 180 mg by mouth daily as needed for allergies.     fluticasone 50 MCG/ACT nasal spray  Commonly known as:  FLONASE  Place 2 sprays into both nostrils 2 (two) times daily as needed for rhinitis.     glimepiride 1 MG tablet  Commonly known as:  AMARYL  Take 2 mg by mouth daily.     glucosamine-chondroitin 500-400 MG tablet  Take 1 tablet by mouth daily.     HYDROcodone-homatropine 5-1.5 MG/5ML syrup  Commonly known as:  HYCODAN  Take 5 mLs by mouth every 6 (six) hours as needed for cough.     levalbuterol 1.25 MG/0.5ML nebulizer solution  Commonly known as:  XOPENEX  Take 1.25 mg by nebulization 4 (four) times daily.     levofloxacin 750 MG tablet  Commonly known as:  LEVAQUIN  Take 1 tablet (750 mg total) by mouth daily.     metoprolol succinate 25 MG 24 hr tablet  Commonly known as:  TOPROL-XL  Take 25 mg by mouth daily.     MULTIVITAMIN PO  Take 1 tablet by mouth daily.     polyethylene glycol packet  Commonly known as:  MIRALAX / GLYCOLAX  Take 17 g by mouth daily as needed for mild constipation.     predniSONE 5 MG tablet  Commonly known as:  DELTASONE  Take 1-6 tablets (5-30 mg total) by mouth See admin instructions. Pt takes as a taper:   6 tablets on day 1-2 5 tablets on day 3-4 4  tablets on day 5-6  3  tablets on day 7-8 2  tablet on day 9-10 1 tablet on day 10-11 Then Stop..     ranitidine 150 MG tablet  Commonly known as:  ZANTAC  Take 150 mg by mouth at bedtime as needed for heartburn.     simvastatin 20 MG tablet  Commonly known as:  ZOCOR  Take 20 mg by mouth daily.     triamterene-hydrochlorothiazide 37.5-25 MG per capsule  Commonly known as:  DYAZIDE  Take 1 capsule by mouth daily.        This discharge took 35 minutes  Signed: [place], [place] 06/28/2015, 11:42 AM

## 2015-06-28 LAB — GLUCOSE, CAPILLARY
Glucose-Capillary: 153 mg/dL — ABNORMAL HIGH (ref 65–99)
Glucose-Capillary: 270 mg/dL — ABNORMAL HIGH (ref 65–99)

## 2015-06-28 MED ORDER — SODIUM CHLORIDE 0.9 % IJ SOLN
INTRAMUSCULAR | Status: AC
  Filled 2015-06-28: qty 10

## 2015-06-28 MED ORDER — CEFTRIAXONE SODIUM 1 G IJ SOLR
1.0000 g | Freq: Once | INTRAMUSCULAR | Status: AC
  Administered 2015-06-28: 1 g via INTRAMUSCULAR
  Filled 2015-06-28: qty 10

## 2015-06-28 MED ORDER — LEVOFLOXACIN 750 MG PO TABS: 750.0000 mg | ORAL_TABLET | Freq: Every day | ORAL | Status: DC

## 2015-06-28 NOTE — Progress Notes (Signed)
Brett Blake is a 74 y.o. male   SUBJECTIVE: Patient admitted with acute respiratory failure due to COPD flare. Chest CT shows no pulmonary embolus nor infiltrate.   Was for dc yest btu had severe onset cough, dyspnea. restarted ceftriaxone and azithro. Better today  ROS: Review of systems is unremarkable for any active cardiac,respiratory, GI, GU, hematologic, neurologic or psychiatric systems, 10 systems reviewed.  Marland Kitchen amLODipine  5 mg Oral Daily  . aspirin  81 mg Oral Daily  . azithromycin  250 mg Oral Daily  . cefTRIAXone (ROCEPHIN)  IV  1 g Intravenous Q24H  . enoxaparin (LOVENOX) injection  40 mg Subcutaneous Q24H  . famotidine  20 mg Oral BID  . glimepiride  2 mg Oral Daily  . insulin aspart  0-20 Units Subcutaneous TID WC  . insulin aspart  0-5 Units Subcutaneous QHS  . levalbuterol  1.25 mg Nebulization QID  . loratadine  10 mg Oral Daily  . methylPREDNISolone (SOLU-MEDROL) injection  40 mg Intravenous Q12H  . metoprolol succinate  25 mg Oral Daily  . multivitamin with minerals  1 tablet Oral Daily  . pantoprazole  40 mg Oral Daily  . simvastatin  20 mg Oral Daily  . triamterene-hydrochlorothiazide  1 tablet Oral Daily   acetaminophen **OR** acetaminophen, albuterol, fluticasone, HYDROcodone-acetaminophen, HYDROcodone-homatropine, ondansetron **OR** ondansetron (ZOFRAN) IV, polyethylene glycol, sodium chloride, zolpidem   Past Medical History  Diagnosis Date  . COPD (chronic obstructive pulmonary disease)   . Hypertension   . Asthma   . High cholesterol   . GERD (gastroesophageal reflux disease)   . Diabetes   . Hyperlipidemia     History reviewed. No pertinent past surgical history.  PHYSICAL EXAM:  BP 134/83 mmHg  Pulse 81  Temp(Src) 97.5 F (36.4 C) (Oral)  Resp 25  Ht 5\' 6"  (1.676 m)  Wt 94.167 kg (207 lb 9.6 oz)  BMI 33.52 kg/m2  SpO2 97%  Wt Readings from Last 3 Encounters:  06/27/15 94.167 kg (207 lb 9.6 oz)           BP Readings from Last 3  Encounters:  06/28/15 134/83    Constitutional: NAD Neck: supple, no thyromegaly Respiratory: mild expiratory wheezing, mostly anterior Cardiovascular: RRR, no murmur, no gallop Abdomen: soft, good BS, nontender Extremities: no edema Neuro: alert and oriented, no focal motor or sensory deficits  CBC    Component Value Date/Time   WBC 7.1 06/25/2015 0339   RBC 4.68 06/25/2015 0339   HGB 14.2 06/25/2015 0339   HCT 42.8 06/25/2015 0339   PLT 257 06/25/2015 0339   MCV 91.4 06/25/2015 0339   MCH 30.4 06/25/2015 0339   MCHC 33.2 06/25/2015 0339   RDW 13.8 06/25/2015 0339   Dg Chest 2 View (if Patient Has Fever And/or Copd)  06/24/2015   CLINICAL DATA:  Cough, congestion, fever.  Shortness of breath.  EXAM: CHEST  2 VIEW  COMPARISON:  CT 02/17/2015  FINDINGS: The heart size and mediastinal contours are within normal limits. Both lungs are clear. The visualized skeletal structures are unremarkable.  IMPRESSION: No active cardiopulmonary disease.   Electronically Signed   By: Rolm Baptise M.D.   On: 06/24/2015 12:11   Ct Chest Wo Contrast  06/24/2015   CLINICAL DATA:  Shortness of breath, COPD, hypertension  EXAM: CT CHEST WITHOUT CONTRAST  TECHNIQUE: Multidetector CT imaging of the chest was performed following the standard protocol without IV contrast.  COMPARISON:  CT chest 02/17/2015  FINDINGS: Images of the thoracic  inlet are unremarkable. Central airways are patent. There is no mediastinal hematoma. Small hiatal hernia is noted. There is a left hilar partially calcified lymph node measures 1.2 cm stable in size from prior exam.  No adrenal gland mass is noted in visualized upper abdomen.  Sagittal images of the spine shows degenerative changes mid and lower thoracic spine. Sagittal view of the sternum is unremarkable.  Heart size within normal limits. No pericardial effusion is noted. Images of the lung parenchyma shows no acute infiltrate or pulmonary edema. There is a calcified granuloma  in left lower lobe laterally measures 9 mm stable in size in appearance from prior exam.  IMPRESSION: 1. No acute infiltrate or pulmonary edema. 2. Stable partially calcified left hilar lymph node. Stable calcified granuloma in left lower lobe. 3. No mediastinal hematoma or adenopathy. 4. Degenerative changes mid and lower thoracic spine 5. Small hiatal hernia.   Electronically Signed   By: Lahoma Crocker M.D.   On: 06/24/2015 21:10     ASSESSMENT/PLAN: Acute respiratory failure-due to COPD flare, no pneumonia.  Wean  Steroid Cont nebs- he requests change to xopenex as less tachycardia and jittery with this  Diabetes mellitus-resume  oral hypoglycemic agent  A1c 6.1  Fu otpt  Hypertension-blood pressures improving, , continue amlodipine maxzide and metoprolol  DC today

## 2015-06-28 NOTE — Care Management Note (Signed)
Case Management Note  Patient Details  Name: Panagiotis Oelkers MRN: [id number] Date of Birth: [birth year]  Subjective/Objective:        Discharging home today. Has a nebulizer at home and his oxygenation SATs yesterday and this morning on room air are in the upper 90's.  No indication for home oxygen at this time.            Action/Plan:   Expected Discharge Date:                  Expected Discharge Plan:     In-House Referral:     Discharge planning Services     Post Acute Care Choice:    Choice offered to:     DME Arranged:    DME Agency:     HH Arranged:    [place] Agency:     Status of Service:     Medicare Important Message Given:  Yes-second notification given Date Medicare IM Given:    Medicare IM give by:    Date Additional Medicare IM Given:    Additional Medicare Important Message give by:     If discussed at [place] of Stay Meetings, dates discussed:    Additional Comments:  Salimata Christenson A, RN 06/28/2015, 8:49 AM

## 2015-08-19 ENCOUNTER — Other Ambulatory Visit: Payer: Self-pay | Admitting: Internal Medicine

## 2015-08-19 DIAGNOSIS — R1084 Generalized abdominal pain: Secondary | ICD-10-CM

## 2015-08-20 ENCOUNTER — Ambulatory Visit
Admission: RE | Admit: 2015-08-20 | Discharge: 2015-08-20 | Disposition: A | Discharge status: Home / Self Care | Payer: Medicare Other | Source: Ambulatory Visit | Attending: Internal Medicine | Admitting: Internal Medicine

## 2015-08-20 DIAGNOSIS — R1084 Generalized abdominal pain: Secondary | ICD-10-CM | POA: Diagnosis present

## 2015-08-20 DIAGNOSIS — N400 Enlarged prostate without lower urinary tract symptoms: Secondary | ICD-10-CM | POA: Diagnosis not present

## 2015-08-20 DIAGNOSIS — K760 Fatty (change of) liver, not elsewhere classified: Secondary | ICD-10-CM | POA: Insufficient documentation

## 2015-08-20 DIAGNOSIS — M5136 Other intervertebral disc degeneration, lumbar region: Secondary | ICD-10-CM | POA: Diagnosis not present

## 2015-08-20 MED ORDER — IOHEXOL 300 MG/ML  SOLN
100.0000 mL | Freq: Once | INTRAMUSCULAR | Status: AC | PRN
  Administered 2015-08-20: 100 mL via INTRAVENOUS

## 2015-09-03 DIAGNOSIS — G4700 Insomnia, unspecified: Secondary | ICD-10-CM | POA: Insufficient documentation

## 2015-09-03 DIAGNOSIS — F5104 Psychophysiologic insomnia: Secondary | ICD-10-CM | POA: Insufficient documentation

## 2015-10-19 DIAGNOSIS — E119 Type 2 diabetes mellitus without complications: Secondary | ICD-10-CM | POA: Insufficient documentation

## 2015-10-19 DIAGNOSIS — E1169 Type 2 diabetes mellitus with other specified complication: Secondary | ICD-10-CM | POA: Insufficient documentation

## 2015-10-19 DIAGNOSIS — E1151 Type 2 diabetes mellitus with diabetic peripheral angiopathy without gangrene: Secondary | ICD-10-CM | POA: Insufficient documentation

## 2016-04-19 DIAGNOSIS — E782 Mixed hyperlipidemia: Secondary | ICD-10-CM | POA: Insufficient documentation

## 2016-05-21 ENCOUNTER — Emergency Department
Admission: EM | Admit: 2016-05-21 | Discharge: 2016-05-21 | Disposition: A | Discharge status: Home / Self Care | Payer: Medicare Other | Attending: Emergency Medicine | Admitting: Emergency Medicine

## 2016-05-21 ENCOUNTER — Emergency Department: Payer: Medicare Other

## 2016-05-21 ENCOUNTER — Encounter: Payer: Self-pay | Admitting: Emergency Medicine

## 2016-05-21 DIAGNOSIS — Z7982 Long term (current) use of aspirin: Secondary | ICD-10-CM | POA: Insufficient documentation

## 2016-05-21 DIAGNOSIS — Z79899 Other long term (current) drug therapy: Secondary | ICD-10-CM | POA: Insufficient documentation

## 2016-05-21 DIAGNOSIS — R0789 Other chest pain: Secondary | ICD-10-CM | POA: Diagnosis present

## 2016-05-21 DIAGNOSIS — J449 Chronic obstructive pulmonary disease, unspecified: Secondary | ICD-10-CM | POA: Diagnosis not present

## 2016-05-21 DIAGNOSIS — Z87891 Personal history of nicotine dependence: Secondary | ICD-10-CM | POA: Insufficient documentation

## 2016-05-21 DIAGNOSIS — J40 Bronchitis, not specified as acute or chronic: Secondary | ICD-10-CM | POA: Insufficient documentation

## 2016-05-21 DIAGNOSIS — E119 Type 2 diabetes mellitus without complications: Secondary | ICD-10-CM | POA: Insufficient documentation

## 2016-05-21 DIAGNOSIS — E785 Hyperlipidemia, unspecified: Secondary | ICD-10-CM | POA: Diagnosis not present

## 2016-05-21 DIAGNOSIS — R079 Chest pain, unspecified: Secondary | ICD-10-CM

## 2016-05-21 DIAGNOSIS — I10 Essential (primary) hypertension: Secondary | ICD-10-CM | POA: Diagnosis not present

## 2016-05-21 LAB — CBC
HCT: 42.4 % (ref 40.0–52.0)
Hemoglobin: 14.6 g/dL (ref 13.0–18.0)
MCH: 30.9 pg (ref 26.0–34.0)
MCHC: 34.3 g/dL (ref 32.0–36.0)
MCV: 90.1 fL (ref 80.0–100.0)
Platelets: 256 10*3/uL (ref 150–440)
RBC: 4.7 MIL/uL (ref 4.40–5.90)
RDW: 13.8 % (ref 11.5–14.5)
WBC: 6.7 10*3/uL (ref 3.8–10.6)

## 2016-05-21 LAB — COMPREHENSIVE METABOLIC PANEL
ALT: 22 U/L (ref 17–63)
AST: 27 U/L (ref 15–41)
Albumin: 4.3 g/dL (ref 3.5–5.0)
Alkaline Phosphatase: 60 U/L (ref 38–126)
Anion gap: 9 (ref 5–15)
BUN: 17 mg/dL (ref 6–20)
CO2: 26 mmol/L (ref 22–32)
Calcium: 9.2 mg/dL (ref 8.9–10.3)
Chloride: 102 mmol/L (ref 101–111)
Creatinine, Ser: 1.1 mg/dL (ref 0.61–1.24)
Glucose, Bld: 127 mg/dL — ABNORMAL HIGH (ref 65–99)
POTASSIUM: 3.7 mmol/L (ref 3.5–5.1)
SODIUM: 137 mmol/L (ref 135–145)
Total Bilirubin: 0.5 mg/dL (ref 0.3–1.2)
Total Protein: 7.4 g/dL (ref 6.5–8.1)

## 2016-05-21 LAB — TROPONIN I

## 2016-05-21 MED ORDER — LEVOFLOXACIN 750 MG PO TABS: 750.0000 mg | ORAL_TABLET | Freq: Every day | ORAL | Status: AC

## 2016-05-21 MED ORDER — AZITHROMYCIN 250 MG PO TABS: ORAL_TABLET | ORAL | Status: DC

## 2016-05-21 MED ORDER — PREDNISONE 20 MG PO TABS: 40.0000 mg | ORAL_TABLET | Freq: Every day | ORAL | Status: DC

## 2016-05-21 NOTE — ED Provider Notes (Signed)
[facility] Emergency Department Provider Note  Time seen: 2:40 PM  I have reviewed the triage vital signs and the nursing notes.   HISTORY  Chief Complaint Chest Pain    HPI Brett Blake is a 75 y.o. male with a past medical history of COPD, hypertension, hyperlipidemia, gastric reflux, diabetes, presents to the emergency department with left-sided chest discomfort and cough. According to the patient for the past 2-3 days he has been having a fairly constant cough which she states is dry, denies sputum production. He states since Thursday evening he has been experiencing left-sided chest pain/tightness, worse when coughing. Denies any pain with deep inspiration. Denies any leg pain or swelling. Patient denies any cardiac history, states he's had a cardiac catheterization in the past but has never required stents or any procedures for his heart. Denies any fever, nausea, vomiting, diaphoresis.Currently describes a very mild left-sided chest tightness.     Past Medical History  Diagnosis Date  . COPD (chronic obstructive pulmonary disease) ([place])   . Hypertension   . Asthma   . High cholesterol   . GERD (gastroesophageal reflux disease)   . Diabetes ([place])   . Hyperlipidemia     Patient Active Problem List   Diagnosis Date Noted  . COPD exacerbation ([place]) 06/24/2015    History reviewed. No pertinent past surgical history.  Current Outpatient Rx  Name  Route  Sig  Dispense  Refill  . albuterol (PROVENTIL HFA;VENTOLIN HFA) 108 (90 BASE) MCG/ACT inhaler   Inhalation   Inhale 2 puffs into the lungs every 6 (six) hours as needed for wheezing or shortness of breath.         Marland Kitchen albuterol (PROVENTIL) (2.5 MG/3ML) 0.083% nebulizer solution   Nebulization   Take 2.5 mg by nebulization every 6 (six) hours as needed for wheezing or shortness of breath.         Marland Kitchen amLODipine (NORVASC) 5 MG tablet   Oral   Take 1 tablet (5 mg total) by mouth daily.   30  tablet   11   . aspirin 81 MG chewable tablet   Oral   Chew 81 mg by mouth daily.         Marland Kitchen esomeprazole (NEXIUM) 40 MG capsule   Oral   Take 40 mg by mouth daily at 12 noon.         . fexofenadine (ALLEGRA) 180 MG tablet   Oral   Take 180 mg by mouth daily as needed for allergies.         . fluticasone (FLONASE) 50 MCG/ACT nasal spray   Each Nare   Place 2 sprays into both nostrils 2 (two) times daily as needed for rhinitis.         Marland Kitchen glimepiride (AMARYL) 1 MG tablet   Oral   Take 2 mg by mouth daily.         Marland Kitchen glucosamine-chondroitin 500-400 MG tablet   Oral   Take 1 tablet by mouth daily.         Marland Kitchen HYDROcodone-homatropine (HYCODAN) 5-1.5 MG/5ML syrup   Oral   Take 5 mLs by mouth every 6 (six) hours as needed for cough.         . levalbuterol (XOPENEX) 1.25 MG/0.5ML nebulizer solution   Nebulization   Take 1.25 mg by nebulization 4 (four) times daily.   1 each   12   . levofloxacin (LEVAQUIN) 750 MG tablet   Oral   Take 1 tablet (  750 mg total) by mouth daily.   5 tablet   0   . metoprolol succinate (TOPROL-XL) 25 MG 24 hr tablet   Oral   Take 25 mg by mouth daily.         . Multiple Vitamins-Minerals (MULTIVITAMIN PO)   Oral   Take 1 tablet by mouth daily.         . polyethylene glycol (MIRALAX / GLYCOLAX) packet   Oral   Take 17 g by mouth daily as needed for mild constipation.         . predniSONE (DELTASONE) 5 MG tablet   Oral   Take 1-6 tablets (5-30 mg total) by mouth See admin instructions. Pt takes as a taper:   6 tablets on day 1-2 5 tablets on day 3-4 4  tablets on day 5-6  3  tablets on day 7-8 2  tablet on day 9-10 1 tablet on day 10-11 Then Stop..   42 tablet   0   . ranitidine (ZANTAC) 150 MG tablet   Oral   Take 150 mg by mouth at bedtime as needed for heartburn.         . simvastatin (ZOCOR) 20 MG tablet   Oral   Take 20 mg by mouth daily.         Marland Kitchen triamterene-hydrochlorothiazide (DYAZIDE) 37.5-25 MG  per capsule   Oral   Take 1 capsule by mouth daily.           Allergies Ace inhibitors and Sulfa antibiotics  Family History  Problem Relation Age of Onset  . Heart attack Mother   . Prostate cancer Father   . Leukemia Brother     Social History Social History  Substance Use Topics  . Smoking status: Former Smoker -- 1.00 packs/day for 15 years    Types: Cigarettes  . Smokeless tobacco: None  . Alcohol Use: 0.6 - 1.2 oz/week    1-2 Shots of liquor per week    Review of Systems Constitutional: Negative for fever. Cardiovascular: Negative for chest pain. Respiratory: Negative for shortness of breath. Dry cough. Gastrointestinal: Negative for abdominal pain, vomiting and diarrhea. Musculoskeletal: Negative for back pain. Negative for leg pain or swelling. Neurological: Negative for headache 10-point ROS otherwise negative.  ____________________________________________   PHYSICAL EXAM:  VITAL SIGNS: ED Triage Vitals  Enc Vitals Group     BP 05/21/16 1333 152/86 mmHg     Pulse Rate 05/21/16 1333 112     Resp 05/21/16 1333 20     Temp 05/21/16 1333 98.2 F (36.8 C)     Temp Source 05/21/16 1333 Oral     SpO2 05/21/16 1333 98 %     Weight 05/21/16 1333 195 lb (88.451 kg)     Height 05/21/16 1333 5\' 6"  (1.676 m)     Head Cir --      Peak Flow --      Pain Score 05/21/16 1335 5     Pain Loc --      Pain Edu? --      Excl. in [place]? --     Constitutional: Alert and oriented. Well appearing and in no distress. Eyes: Normal exam ENT   Head: Normocephalic and atraumatic.   Mouth/Throat: Mucous membranes are moist. Cardiovascular: Normal rate, regular rhythm. No murmur Respiratory: Normal respiratory effort without tachypnea nor retractions. Breath sounds are clear and equal bilaterally. No wheezes/rales/rhonchi. Chest is nontender to palpation. Gastrointestinal: Soft and nontender. No distention.  Musculoskeletal: Nontender with  normal range of motion in  all extremities. No lower extremity tenderness or edema. Neurologic:  Normal speech and language. No gross focal neurologic deficits Skin:  Skin is warm, dry and intact.  Psychiatric: Mood and affect are normal.   ____________________________________________    EKG  EKG reviewed and interpreted by myself shows sinus tachycardia 111 bpm, narrow QRS, left axis deviation, no concerning ST changes noted. Intervals are within normal limits.  ____________________________________________    RADIOLOGY  Chest x-ray negative.  ____________________________________________    INITIAL IMPRESSION / ASSESSMENT AND PLAN / ED COURSE  Pertinent labs & imaging results that were available during my care of the patient were reviewed by me and considered in my medical decision making (see chart for details).  The patient presents the emergency department with cough and left-sided chest discomfort/tightness. Patient does have a frequent cough during examination. Patient states a history of bronchitis in the past which this feels very similar. Patient's labs including his troponin are within normal limits. EKG shows mild tachycardia, but no concerning ST changes. We'll obtain a 2 view chest x-ray to help further evaluate. Suspect likely bronchitis given his dry hacking cough in the emergency department. Patient has reassuring labs and an overall normal x-ray besides mild tachycardia. Patient's heart rate currently 95 bpm.  Chest x-ray negative. Patient states he continues to feel well. Patient is mildly tachycardic around 90-95 bpm. Patient states that is very normal for him. States his heart rate is usually around 100 bpm. Patient denies any chest pain at this time. Denies any pleuritic pain at any point. We will discharge the patient with a short course of antibiotics to cover for bronchitis as well as a 5 day course of steroids. I discussed very strict return precautions with the patient, he is agreeable to  this plan.  ____________________________________________   FINAL CLINICAL IMPRESSION(S) / ED DIAGNOSES  Chest pain  bronchitis  Harvest Dark, MD 05/21/16 1513

## 2016-05-21 NOTE — ED Notes (Signed)
Chest pain x 2 days, points to L chest.

## 2016-05-21 NOTE — ED Notes (Signed)
Patient c/o chest pain on the left side since Thursday. Patient states that he has had a dry cough for the past few days. Patient denies any pain when taking a deep breath. Patient denies fever.

## 2016-05-21 NOTE — Discharge Instructions (Signed)
You have been seen in the emergency department today for chest pain and cough. Your workup has shown normal results and is most consistent with a possible bronchitis. As we discussed please follow-up with your primary care physician in the next 2-3 days for recheck. Return to the emergency department for any further chest pain, trouble breathing, or any other symptom personally concerning to yourself. Please take your medications as prescribed for their entire course.  Nonspecific Chest Pain It is often hard to find the cause of chest pain. There is always a chance that your pain could be related to something serious, such as a heart attack or a blood clot in your lungs. Chest pain can also be caused by conditions that are not life-threatening. If you have chest pain, it is very important to follow up with your doctor.  HOME CARE  If you were prescribed an antibiotic medicine, finish it all even if you start to feel better.  Avoid any activities that cause chest pain.  Do not use any tobacco products, including cigarettes, chewing tobacco, or electronic cigarettes. If you need help quitting, ask your doctor.  Do not drink alcohol.  Take medicines only as told by your doctor.  Keep all follow-up visits as told by your doctor. This is important. This includes any further testing if your chest pain does not go away.  Your doctor may tell you to keep your head raised (elevated) while you sleep.  Make lifestyle changes as told by your doctor. These may include:  Getting regular exercise. Ask your doctor to suggest some activities that are safe for you.  Eating a heart-healthy diet. Your doctor or a diet specialist (dietitian) can help you to learn healthy eating options.  Maintaining a healthy weight.  Managing diabetes, if necessary.  Reducing stress. GET HELP IF:  Your chest pain does not go away, even after treatment.  You have a rash with blisters on your chest.  You have a  fever. GET HELP RIGHT AWAY IF:  Your chest pain is worse.  You have an increasing cough, or you cough up blood.  You have severe belly (abdominal) pain.  You feel extremely weak.  You pass out (faint).  You have chills.  You have sudden, unexplained chest discomfort.  You have sudden, unexplained discomfort in your arms, back, neck, or jaw.  You have shortness of breath at any time.  You suddenly start to sweat, or your skin gets clammy.  You feel nauseous.  You vomit.  You suddenly feel light-headed or dizzy.  Your heart begins to beat quickly, or it feels like it is skipping beats. These symptoms may be an emergency. Do not wait to see if the symptoms will go away. Get medical help right away. Call your local emergency services (911 in the [place] Do not drive yourself to the hospital.   This information is not intended to replace advice given to you by your health care provider. Make sure you discuss any questions you have with your health care provider.   Document Released: 05/30/2008 Document Revised: 01/02/2015 Document Reviewed: 07/18/2014 Elsevier Interactive Patient Education 2016 Elsevier Inc.  Upper Respiratory Infection, Adult Most upper respiratory infections (URIs) are a viral infection of the air passages leading to the lungs. A URI affects the nose, throat, and upper air passages. The most common type of URI is nasopharyngitis and is typically referred to as "the common cold." URIs run their course and usually go away on their own. Most  of the time, a URI does not require medical attention, but sometimes a bacterial infection in the upper airways can follow a viral infection. This is called a secondary infection. Sinus and middle ear infections are common types of secondary upper respiratory infections. Bacterial pneumonia can also complicate a URI. A URI can worsen asthma and chronic obstructive pulmonary disease (COPD). Sometimes, these complications can  require emergency medical care and may be life threatening.  CAUSES Almost all URIs are caused by viruses. A virus is a type of germ and can spread from one person to another.  RISKS FACTORS You may be at risk for a URI if:   You smoke.   You have chronic heart or lung disease.  You have a weakened defense (immune) system.   You are very young or very old.   You have nasal allergies or asthma.  You work in crowded or poorly ventilated areas.  You work in health care facilities or schools. SIGNS AND SYMPTOMS  Symptoms typically develop 2-3 days after you come in contact with a cold virus. Most viral URIs last 7-10 days. However, viral URIs from the influenza virus (flu virus) can last 14-18 days and are typically more severe. Symptoms may include:   Runny or stuffy (congested) nose.   Sneezing.   Cough.   Sore throat.   Headache.   Fatigue.   Fever.   Loss of appetite.   Pain in your forehead, behind your eyes, and over your cheekbones (sinus pain).  Muscle aches.  DIAGNOSIS  Your health care provider may diagnose a URI by:  Physical exam.  Tests to check that your symptoms are not due to another condition such as:  Strep throat.  Sinusitis.  Pneumonia.  Asthma. TREATMENT  A URI goes away on its own with time. It cannot be cured with medicines, but medicines may be prescribed or recommended to relieve symptoms. Medicines may help:  Reduce your fever.  Reduce your cough.  Relieve nasal congestion. HOME CARE INSTRUCTIONS   Take medicines only as directed by your health care provider.   Gargle warm saltwater or take cough drops to comfort your throat as directed by your health care provider.  Use a warm mist humidifier or inhale steam from a shower to increase air moisture. This may make it easier to breathe.  Drink enough fluid to keep your urine clear or pale yellow.   Eat soups and other clear broths and maintain good nutrition.    Rest as needed.   Return to work when your temperature has returned to normal or as your health care provider advises. You may need to stay home longer to avoid infecting others. You can also use a face mask and careful hand washing to prevent spread of the virus.  Increase the usage of your inhaler if you have asthma.   Do not use any tobacco products, including cigarettes, chewing tobacco, or electronic cigarettes. If you need help quitting, ask your health care provider. PREVENTION  The best way to protect yourself from getting a cold is to practice good hygiene.   Avoid oral or hand contact with people with cold symptoms.   Wash your hands often if contact occurs.  There is no clear evidence that vitamin C, vitamin E, echinacea, or exercise reduces the chance of developing a cold. However, it is always recommended to get plenty of rest, exercise, and practice good nutrition.  SEEK MEDICAL CARE IF:   You are getting worse rather than  better.   Your symptoms are not controlled by medicine.   You have chills.  You have worsening shortness of breath.  You have brown or red mucus.  You have yellow or brown nasal discharge.  You have pain in your face, especially when you bend forward.  You have a fever.  You have swollen neck glands.  You have pain while swallowing.  You have white areas in the back of your throat. SEEK IMMEDIATE MEDICAL CARE IF:   You have severe or persistent:  Headache.  Ear pain.  Sinus pain.  Chest pain.  You have chronic lung disease and any of the following:  Wheezing.  Prolonged cough.  Coughing up blood.  A change in your usual mucus.  You have a stiff neck.  You have changes in your:  Vision.  Hearing.  Thinking.  Mood. MAKE SURE YOU:   Understand these instructions.  Will watch your condition.  Will get help right away if you are not doing well or get worse.   This information is not intended to  replace advice given to you by your health care provider. Make sure you discuss any questions you have with your health care provider.   Document Released: 06/07/2001 Document Revised: 04/28/2015 Document Reviewed: 03/19/2014 Elsevier Interactive Patient Education [employer].

## 2016-06-29 ENCOUNTER — Ambulatory Visit (INDEPENDENT_AMBULATORY_CARE_PROVIDER_SITE_OTHER): Payer: Medicare Other | Admitting: Urology

## 2016-06-29 ENCOUNTER — Encounter: Payer: Self-pay | Admitting: Urology

## 2016-06-29 VITALS — BP 154/96 | HR 116 | Ht 66.0 in | Wt 200.0 lb

## 2016-06-29 DIAGNOSIS — R972 Elevated prostate specific antigen [PSA]: Secondary | ICD-10-CM | POA: Diagnosis not present

## 2016-06-29 DIAGNOSIS — I10 Essential (primary) hypertension: Secondary | ICD-10-CM | POA: Insufficient documentation

## 2016-06-29 DIAGNOSIS — K635 Polyp of colon: Secondary | ICD-10-CM | POA: Insufficient documentation

## 2016-06-29 NOTE — Progress Notes (Addendum)
Consult - elevated PSA Referred by Dr. [name]   Chief Complaint: elevated PSA  History of Present Illness: Patient with a PSA of 3.30 March 2014 which rose to 4.78 April 2017. It was recently rechecked June 2017 and noted to be 5.39. Patient reports a prior biopsy at [facility] 4-5 years ago that showed some "suspicious" areas and it was recommended he undergo a "30" core biopsy. He declined. He has been under surveillance since. His brother and his father had prostate cancer.  The patient has occasional weak stream, urgency, intermittent flow and nocturia. He also has trouble getting and maintaining an erection.  Past Medical History  Diagnosis Date  . COPD (chronic obstructive pulmonary disease) ([place])   . Hypertension   . Asthma   . High cholesterol   . GERD (gastroesophageal reflux disease)   . Diabetes ([place])   . Hyperlipidemia   . Anxiety   . Arthritis   . Depression    Past Surgical History  Procedure Laterality Date  . Thumb arthroscopy  2015  . Rotator cuff repair  2003  . Tonsillectomy  1952    Home Medications:   (Not in a hospital admission) Allergies:  Allergies  Allergen Reactions  . Ace Inhibitors Cough  . Sulfa Antibiotics Rash    Family History  Problem Relation Age of Onset  . Heart attack Mother   . Prostate cancer Father   . Leukemia Brother   . Prostate cancer Brother    Social History:  reports that he has quit smoking. His smoking use included Cigarettes. He has a 15 pack-year smoking history. He does not have any smokeless tobacco history on file. He reports that he drinks about 0.6 - 1.2 oz of alcohol per week. He reports that he does not use illicit drugs.  ROS: A complete review of systems was performed.  All systems are negative except for pertinent findings as noted. Review of Systems  Gastrointestinal: Positive for heartburn, nausea, vomiting and constipation.  Musculoskeletal: Positive for joint pain.     Physical Exam:  Vital signs in  last 24 hours: @VSRANGES @ General:  Alert and oriented, No acute distress HEENT: Normocephalic, atraumatic Cardiovascular: Regular rate and rhythm Lungs: Regular rate and effort Abdomen: Soft, nontender, nondistended, no abdominal masses Extremities: No edema Neurologic: Grossly intact GU: The penis and foreskin were without mass or lesion. The testicles were descended bilaterally and palpably normal. DRE: Prostate approximate 50 g, smooth without hard area or nodule. All landmarks preserved.  Laboratory Data:  No results found for this or any previous visit (from the past 24 hour(s)). No results found for this or any previous visit (from the past 240 hour(s)). Creatinine: No results for input(s): CREATININE in the last 168 hours.   Prior labs -- U/A was clear, Cr 1.1  Impression/Assessment/plan: PSA elevation-we had a long discussion with the patient on the nature of elevated PSA-benign vs. malignant. Also we discussed the nature risk and benefits of PSA screening. We discussed the nature risks and benefits of continued surveillance, prostate MRI, other lab test and prostate biopsy. We discussed given the prior suspicious findings and the rising PSA I recommended a prostate biopsy. He declined. We did discuss continued surveillance risks prostate cancer progressing from curable to incurable which could be life-threatening. He did agree to prostate MRI. He had BPH on exam today and otherwise a normal exam. Will plan for prostate MRI follow-up to review results.   Ariyel Jeangilles 06/29/2016, 9:03 AM

## 2016-06-30 ENCOUNTER — Other Ambulatory Visit: Payer: Self-pay

## 2016-06-30 DIAGNOSIS — R972 Elevated prostate specific antigen [PSA]: Secondary | ICD-10-CM

## 2016-11-02 ENCOUNTER — Other Ambulatory Visit: Payer: Self-pay | Admitting: Cardiology

## 2016-11-04 ENCOUNTER — Ambulatory Visit
Admission: RE | Admit: 2016-11-04 | Discharge: 2016-11-04 | Disposition: A | Discharge status: Home / Self Care | Payer: Medicare Other | Source: Ambulatory Visit | Attending: Cardiology | Admitting: Cardiology

## 2016-11-04 ENCOUNTER — Encounter: Payer: Self-pay | Admitting: *Deleted

## 2016-11-04 ENCOUNTER — Encounter
Admission: RE | Disposition: A | Discharge status: Home / Self Care | Payer: Self-pay | Source: Ambulatory Visit | Attending: Cardiology

## 2016-11-04 DIAGNOSIS — I2510 Atherosclerotic heart disease of native coronary artery without angina pectoris: Secondary | ICD-10-CM | POA: Insufficient documentation

## 2016-11-04 DIAGNOSIS — Z79899 Other long term (current) drug therapy: Secondary | ICD-10-CM | POA: Diagnosis not present

## 2016-11-04 DIAGNOSIS — R079 Chest pain, unspecified: Secondary | ICD-10-CM | POA: Diagnosis present

## 2016-11-04 DIAGNOSIS — Z7951 Long term (current) use of inhaled steroids: Secondary | ICD-10-CM | POA: Diagnosis not present

## 2016-11-04 DIAGNOSIS — K219 Gastro-esophageal reflux disease without esophagitis: Secondary | ICD-10-CM | POA: Diagnosis not present

## 2016-11-04 DIAGNOSIS — R0602 Shortness of breath: Secondary | ICD-10-CM | POA: Diagnosis not present

## 2016-11-04 DIAGNOSIS — R0789 Other chest pain: Secondary | ICD-10-CM | POA: Diagnosis not present

## 2016-11-04 DIAGNOSIS — E782 Mixed hyperlipidemia: Secondary | ICD-10-CM | POA: Insufficient documentation

## 2016-11-04 DIAGNOSIS — Z87891 Personal history of nicotine dependence: Secondary | ICD-10-CM | POA: Insufficient documentation

## 2016-11-04 DIAGNOSIS — Z7984 Long term (current) use of oral hypoglycemic drugs: Secondary | ICD-10-CM | POA: Insufficient documentation

## 2016-11-04 DIAGNOSIS — Z7982 Long term (current) use of aspirin: Secondary | ICD-10-CM | POA: Diagnosis not present

## 2016-11-04 DIAGNOSIS — R9439 Abnormal result of other cardiovascular function study: Secondary | ICD-10-CM | POA: Diagnosis not present

## 2016-11-04 DIAGNOSIS — I10 Essential (primary) hypertension: Secondary | ICD-10-CM | POA: Insufficient documentation

## 2016-11-04 DIAGNOSIS — G4733 Obstructive sleep apnea (adult) (pediatric): Secondary | ICD-10-CM | POA: Insufficient documentation

## 2016-11-04 DIAGNOSIS — E119 Type 2 diabetes mellitus without complications: Secondary | ICD-10-CM | POA: Diagnosis not present

## 2016-11-04 HISTORY — PX: CARDIAC CATHETERIZATION: SHX172

## 2016-11-04 HISTORY — DX: Sleep apnea, unspecified: G47.30

## 2016-11-04 HISTORY — DX: Polyp of colon: K63.5

## 2016-11-04 SURGERY — LEFT HEART CATH AND CORONARY ANGIOGRAPHY
Anesthesia: Moderate Sedation | Laterality: Left

## 2016-11-04 MED ORDER — FENTANYL CITRATE (PF) 100 MCG/2ML IJ SOLN
INTRAMUSCULAR | Status: DC | PRN
  Administered 2016-11-04: 25 ug via INTRAVENOUS

## 2016-11-04 MED ORDER — HEPARIN (PORCINE) IN NACL 2-0.9 UNIT/ML-% IJ SOLN
INTRAMUSCULAR | Status: AC
  Filled 2016-11-04: qty 500

## 2016-11-04 MED ORDER — IOPAMIDOL (ISOVUE-300) INJECTION 61%
INTRAVENOUS | Status: DC | PRN
  Administered 2016-11-04: 90 mL via INTRA_ARTERIAL

## 2016-11-04 MED ORDER — MIDAZOLAM HCL 2 MG/2ML IJ SOLN
INTRAMUSCULAR | Status: DC | PRN
  Administered 2016-11-04: 1 mg via INTRAVENOUS

## 2016-11-04 MED ORDER — NITROGLYCERIN 0.4 MG SL SUBL
SUBLINGUAL_TABLET | SUBLINGUAL | Status: AC
  Filled 2016-11-04: qty 1

## 2016-11-04 MED ORDER — FENTANYL CITRATE (PF) 100 MCG/2ML IJ SOLN
INTRAMUSCULAR | Status: AC
  Filled 2016-11-04: qty 2

## 2016-11-04 MED ORDER — NITROGLYCERIN 0.4 MG SL SUBL
SUBLINGUAL_TABLET | SUBLINGUAL | Status: DC | PRN
  Administered 2016-11-04: .4 mg via SUBLINGUAL

## 2016-11-04 MED ORDER — MIDAZOLAM HCL 2 MG/2ML IJ SOLN
INTRAMUSCULAR | Status: AC
  Filled 2016-11-04: qty 2

## 2016-11-04 SURGICAL SUPPLY — 9 items
CATH 5FR JR4 DIAGNOSTIC (CATHETERS) ×2 IMPLANT
CATH INFINITI 5FR ANG PIGTAIL (CATHETERS) ×2 IMPLANT
CATH INFINITI 5FR JL4 (CATHETERS) ×2 IMPLANT
DEVICE CLOSURE MYNXGRIP 5F (Vascular Products) ×2 IMPLANT
GUIDEWIRE 3MM J TIP .035 145 (WIRE) ×2 IMPLANT
KIT MANI 3VAL PERCEP (MISCELLANEOUS) ×2 IMPLANT
NEEDLE PERC 18GX7CM (NEEDLE) ×2 IMPLANT
PACK CARDIAC CATH (CUSTOM PROCEDURE TRAY) ×2 IMPLANT
SHEATH PINNACLE 5F 10CM (SHEATH) ×2 IMPLANT

## 2016-11-04 NOTE — Discharge Instructions (Signed)

## 2016-11-04 NOTE — H&P (Signed)
Chief Complaint: Chief Complaint  Patient presents with  . Follow-up  new pt per [facility] Chance abn stress echo  . Shortness of Breath  worse through the summer uses a neb for my breathing  . Chest Pain  center of chest bothers me all the time hurts when I breath  . other  I get clammy feeling and sometimes has the sweats -my hair gets wet  . Fatigue  Im more fatiqued  Date of Service: 11/01/2016 Date of Birth: 05/06/1941 PCP: [name], MD  History of Present Illness: Brett Blake is a 75 y.o.male patient who presents in referral for evaluation after developing exertional chest pain and a high risk abnormal stress echo read as showing anteroseptal ischemia. Patient has had several month history of exertional chest discomfort and shortness of breath. He states that it is almost constant chest pain at present. His electrocardiogram showed sinus rhythm with no ischemic changes. He feels diaphoretic when he ambulates. He is more fatigue. He has had cardiac catheterizations 2 in the distant past showing no significant coronary disease. Risk factors include diabetes, hyperlipidemia, hypertension.  Past Medical and Surgical History  Past Medical History Past Medical History:  Diagnosis Date  . Bronchitis, chronic (CMS-HCC) 12/14/2011  . Colon polyps  Tuular Adenoma  . Diabetes (CMS-HCC) 07/01/2013  . GERD (gastroesophageal reflux disease)  . Hyperlipidemia, unspecified  . Hypertension  . OSA (obstructive sleep apnea) 02/13/2015  Intolerant of CPAP   Past Surgical History He has a past surgical history that includes Tonsillectomy; Circumcision; Colonoscopy (08/2011); Arthroscopy Shoulder (Left, 2003); esophagogastrodoudenoscopy w/biopsy (02/27/2013); bronchoscopy flexible (Bilateral, 02/27/2013); excision lesion tendon sheath hand/finger (Left, 04/29/2013); excision lesion tendon sheath hand/finger (Left, 05/26/2014); and colonoscopy (01/23/2015).   Medications and Allergies  Current  Medications  Current Outpatient Prescriptions  Medication Sig Dispense Refill  . albuterol (VENTOLIN HFA) 90 mcg/actuation inhaler Inhale 2 inhalations into the lungs every 4 (four) hours as needed. 3 Inhaler 1  . aspirin 81 MG chewable tablet Take 81 mg by mouth daily.  Marland Kitchen esomeprazole (NEXIUM) 20 MG DR capsule Take 2 capsules (40 mg total) by mouth once daily. (Patient taking differently: Take 20 mg by mouth once daily. ) 30 capsule 3  . fexofenadine (ALLEGRA) 180 MG tablet Take 1 tablet (180 mg total) by mouth once daily as needed. 30 tablet 3  . fluticasone-salmeterol (ADVAIR DISKUS) 100-50 mcg/dose diskus inhaler Inhale 1 inhalation into the lungs every 12 (twelve) hours. 1 Inhaler 12  . glimepiride (AMARYL) 1 MG tablet TAKE 2 TABLETS (2 MG TOTAL) BY MOUTH DAILY WITH BREAKFAST. 180 tablet 3  . GLUC/CHND/OM3/DHA/EPA/FISH/STR (GLUCOSAMINE CHONDROITIN PLUS ORAL) Take by mouth once daily.  Marland Kitchen levalbuterol (XOPENEX) 1.25 mg/3 mL nebulizer solution Take 3 mLs (1.25 mg total) by nebulization 4 (four) times daily. Diagnosis code: J41.0 360 mL 3  . mv,Ca,min-iron-FA-lycopene (CENTRUM ULTRA MEN'S) 8 mg iron- 200 mcg-600 mcg Tab 1 tab by mouth daily  . ranitidine (ZANTAC) 150 MG tablet TAKE 1 TABLET (150 MG TOTAL) BY MOUTH NIGHTLY. 30 tablet 5  . simvastatin (ZOCOR) 20 MG tablet TAKE 1 TABLET BY MOUTH EVERY DAY 90 tablet 2  . albuterol (PROVENTIL) 2.5 mg /3 mL (0.083 %) nebulizer solution Take 3 mLs (2.5 mg total) by nebulization every 6 (six) hours as needed for Wheezing. 75 mL 5  . blood glucose diagnostic (FREESTYLE LITE STRIPS) test strip Use once daily. Use as instructed. ICD10 E11.9 Test once daily. 100 each 11  . fluticasone (FLONASE) 50 mcg/actuation  nasal spray Place 2 sprays into both nostrils once daily. (Patient taking differently: Place 2 sprays into both nostrils once daily as needed.  ) 48 g 11  . ibuprofen 200 mg Cap Take 400 mg by mouth as needed.  . nitroGLYcerin (NITROSTAT) 0.4 MG SL  tablet Place 1 tablet (0.4 mg total) under the tongue every 5 (five) minutes as needed for Chest pain. May take up to 3 doses. 25 tablet 11  . triamterene-hydrochlorothiazide (DYAZIDE) 37.5-25 mg capsule TAKE 1 CAPSULE BY MOUTH EVERY MORNING. 90 capsule 3   No current facility-administered medications for this visit.   Allergies: Ace inhibitors; Arb-angiotensin receptor antagonist; and Sulfa (sulfonamide antibiotics)  Social and Family History  Social History reports that he quit smoking about 41 years ago. His smoking use included Cigarettes. He started smoking about 58 years ago. He has a 20.00 pack-year smoking history. He has never used smokeless tobacco. He reports that he drinks alcohol. He reports that he does not use drugs.  Family History Family History  Problem Relation Age of Onset  . Prostate cancer Father  . Hypertension Father  . Heart attack Mother  age 83  . Stroke Mother  . Lung cancer Sister  one sister deceased from lung cancer  . Leukemia Brother  one deceased from leukemia  . Coronary artery disease Neg Hx  . Diabetes type II Neg Hx  . Anesthesia problems Neg Hx  . Malignant hyperthermia Neg Hx  . Pseudochol deficiency Neg Hx   Review of Systems  Review of Systems  Constitutional: Positive for diaphoresis.  HENT: Negative.  Eyes: Negative.  Respiratory: Positive for shortness of breath.  Cardiovascular: Positive for chest pain.  Gastrointestinal: Negative.  Genitourinary: Negative.  Musculoskeletal: Negative.  Skin: Negative.  Neurological: Positive for weakness.  Endo/Heme/Allergies: Negative.  Psychiatric/Behavioral: Negative.   Physical Examination   Vitals:BP 128/80  Pulse (!) 120  Resp 12  Ht 165.1 cm (5\' 5" )  Wt 88.5 kg (195 lb)  BMI 32.45 kg/m  Ht:165.1 cm (5\' 5" ) Wt:88.5 kg (195 lb) [id number] surface area is 2.01 meters squared. Body mass index is 32.45 kg/m.  Wt Readings from Last 3 Encounters:  11/01/16 88.5 kg (195 lb)   10/18/16 88 kg (194 lb)  09/16/16 91.2 kg (201 lb)   BP Readings from Last 3 Encounters:  11/01/16 128/80  10/18/16 130/80  09/16/16 140/84   General appearance appears in no acute distress  Head Mouth and Eye exam Normocephalic, without obvious abnormality, atraumatic Dentition is good Eyes appear anicteric   Neck exam Thyroid: normal  Nodes: no obvious adenopathy  LUNGS Breath Sounds: Normal Percussion: Normal  CARDIOVASCULAR JVP CV wave: no HJR: no Elevation at 90 degrees: None Carotid Pulse: normal pulsation bilaterally Bruit: None Apex: apical impulse normal  Auscultation Rhythm: normal sinus rhythm S1: normal S2: normal Clicks: no Rub: no Murmurs: no murmurs  Gallop: None ABDOMEN Liver enlargement: no Pulsatile aorta: no Ascites: no Bruits: no  EXTREMITIES Clubbing: no Edema: trace to 1+ bilateral pedal edema Pulses: peripheral pulses symmetrical Femoral Bruits: no Amputation: no SKIN Rash: no Cyanosis: no Embolic phemonenon: no Bruising: no NEURO Alert and Oriented to person, place and time: yes Non focal: yes  PSYCH: Pt appears to have normal affect  LABS REVIEWED Last 3 CBC results: Lab Results  Component Value Date  WBC 5.4 10/11/2016  WBC 4.0 (L) 04/12/2016  WBC 3.4 (L) 08/19/2015   Lab Results  Component Value Date  HGB 14.6 10/11/2016  HGB 13.9 (  L) 04/12/2016  HGB 14.1 10/12/2015   Lab Results  Component Value Date  HCT 43.3 10/11/2016  HCT 40.6 04/12/2016  HCT 39.9 (L) 08/19/2015   Lab Results  Component Value Date  PLT 254 10/11/2016  PLT 267 04/12/2016  PLT 266 08/19/2015   Lab Results  Component Value Date  CREATININE 1.3 10/11/2016  BUN 23 10/11/2016  NA 140 10/11/2016  K 4.1 10/11/2016  CL 100 10/11/2016  CO2 30.3 10/11/2016   Lab Results  Component Value Date  HGBA1C 6.0 (H) 10/11/2016   Lab Results  Component Value Date  HDL 61.3 10/11/2016  HDL 53.5 04/12/2016  HDL 39.3 10/12/2015    Lab Results  Component Value Date  LDLCALC 83 10/11/2016  LDLCALC 63 04/12/2016  LDLCALC 75 10/12/2015   Lab Results  Component Value Date  TRIG 248 (H) 10/11/2016  TRIG 244 (H) 04/12/2016  TRIG 142 10/12/2015   Lab Results  Component Value Date  ALT 21 10/11/2016  AST 21 10/11/2016  ALKPHOS 66 10/11/2016   Lab Results  Component Value Date  TSH 1.350 04/12/2016   Diagnostic Studies Reviewed:  EKG EKG demonstrated normal sinus rhythm, nonspecific ST and T waves changes.  Assessment and Plan   75 y.o. male with  ICD-10-CM ICD-9-CM  1. Acute chest pain, unspecified-patient has exertional chest pain which is becoming class III-IV. Had a high risk stress echo read as showing anteroseptal ischemia. He has multiple risk factors for coronary disease. Based on this will proceed with left heart cath to evaluate coronary anatomy. We will give him sublingual nitroglycerin to be taken as needed chest pain. Will continue with aspirin. Patient will remain on simvastatin. Further recommendations after study is completed. Patient advised to avoid activity causing symptoms. R07.9 [id number] Basic Metabolic Panel (BMP)  CBC w/auto Differential (5 Part)  nitroGLYcerin (NITROSTAT) 0.4 MG SL tablet  2. Essential hypertension-continue with current regimen. DASH diet is recommended I10 401.9  3. Hyperlipidemia, mixed-continue with simvastatin. LDL goal less than 100. Current value is 83. Low-fat diet is also recommended. E78.2 272.2  4. OSA (obstructive sleep apnea)-weight loss and CPAP use is recommended. G47.33 327.23   Return in about 2 weeks (around 11/15/2016).  These notes generated with voice recognition software. I apologize for typographical errors.  Sydnee Levans, MD   H and P reviewed. No change from above

## 2016-11-30 ENCOUNTER — Emergency Department: Payer: Medicare Other

## 2016-11-30 ENCOUNTER — Encounter: Payer: Self-pay | Admitting: Emergency Medicine

## 2016-11-30 ENCOUNTER — Emergency Department
Admission: EM | Admit: 2016-11-30 | Discharge: 2016-11-30 | Disposition: A | Discharge status: Home / Self Care | Payer: Medicare Other | Attending: Emergency Medicine | Admitting: Emergency Medicine

## 2016-11-30 DIAGNOSIS — E119 Type 2 diabetes mellitus without complications: Secondary | ICD-10-CM | POA: Insufficient documentation

## 2016-11-30 DIAGNOSIS — R42 Dizziness and giddiness: Secondary | ICD-10-CM | POA: Insufficient documentation

## 2016-11-30 DIAGNOSIS — Z7984 Long term (current) use of oral hypoglycemic drugs: Secondary | ICD-10-CM | POA: Diagnosis not present

## 2016-11-30 DIAGNOSIS — J45909 Unspecified asthma, uncomplicated: Secondary | ICD-10-CM | POA: Insufficient documentation

## 2016-11-30 DIAGNOSIS — R079 Chest pain, unspecified: Secondary | ICD-10-CM | POA: Insufficient documentation

## 2016-11-30 DIAGNOSIS — R531 Weakness: Secondary | ICD-10-CM | POA: Insufficient documentation

## 2016-11-30 DIAGNOSIS — Z7982 Long term (current) use of aspirin: Secondary | ICD-10-CM | POA: Insufficient documentation

## 2016-11-30 DIAGNOSIS — J449 Chronic obstructive pulmonary disease, unspecified: Secondary | ICD-10-CM | POA: Diagnosis not present

## 2016-11-30 DIAGNOSIS — Z87891 Personal history of nicotine dependence: Secondary | ICD-10-CM | POA: Diagnosis not present

## 2016-11-30 DIAGNOSIS — Z791 Long term (current) use of non-steroidal anti-inflammatories (NSAID): Secondary | ICD-10-CM | POA: Insufficient documentation

## 2016-11-30 DIAGNOSIS — Z79899 Other long term (current) drug therapy: Secondary | ICD-10-CM | POA: Diagnosis not present

## 2016-11-30 DIAGNOSIS — I10 Essential (primary) hypertension: Secondary | ICD-10-CM | POA: Diagnosis not present

## 2016-11-30 LAB — CBC
HCT: 37.9 % — ABNORMAL LOW (ref 40.0–52.0)
Hemoglobin: 13.2 g/dL (ref 13.0–18.0)
MCH: 31.3 pg (ref 26.0–34.0)
MCHC: 34.9 g/dL (ref 32.0–36.0)
MCV: 89.8 fL (ref 80.0–100.0)
PLATELETS: 230 10*3/uL (ref 150–440)
RBC: 4.22 MIL/uL — ABNORMAL LOW (ref 4.40–5.90)
RDW: 14.4 % (ref 11.5–14.5)
WBC: 5.6 10*3/uL (ref 3.8–10.6)

## 2016-11-30 LAB — BASIC METABOLIC PANEL
Anion gap: 10 (ref 5–15)
BUN: 25 mg/dL — AB (ref 6–20)
CALCIUM: 9.4 mg/dL (ref 8.9–10.3)
CHLORIDE: 103 mmol/L (ref 101–111)
CO2: 27 mmol/L (ref 22–32)
CREATININE: 1.23 mg/dL (ref 0.61–1.24)
GFR calc Af Amer: >60 mL/min (ref >60)
GFR calc non Af Amer: 56 mL/min — ABNORMAL LOW (ref >60)
Glucose, Bld: 94 mg/dL (ref 65–99)
Potassium: 3.4 mmol/L — ABNORMAL LOW (ref 3.5–5.1)
SODIUM: 140 mmol/L (ref 135–145)

## 2016-11-30 LAB — TROPONIN I: Troponin I: <0.03 ng/mL (ref <0.03)

## 2016-11-30 NOTE — ED Provider Notes (Signed)
[facility] Emergency Department Provider Note   ____________________________________________   First MD Initiated Contact with Patient 11/30/16 0405     (approximate)  I have reviewed the triage vital signs and the nursing notes.   HISTORY  Chief Complaint Weakness    HPI Brett Blake is a 75 y.o. male who comes into the hospital today with some dizziness and weakness. The patient got up to go to the bathroom and reports that when he was walking back he felt dizzy. He reports that he couldn't walk and felt some numbness in his bilateral little fingers. He reports that he feels better at this time. It only lasted a few minutes around 3 AM. The patient reports that yesterday he had cut down a tree and developed some chest pain and shortness of breath. He laid on the porch for about 10 minutes and then went back and move the trees around. The patient reports that there was a lot of dust as he was mowing some of the leaves and he developed some congestion as well. The patient denies any current chest pain reports it was just yesterday while he was mowing. He reports that he did have to lean against a wall when he was walking back to his room. The patient denies any room spinning. He reports that he was afraid of not coming. He states that he is short of breath all the time and he has nausea often. He denies any headache or blurred vision. The patient did have a cardiac catheterization 3 weeks ago and had 50% lesion. He is here today for evaluation   Past Medical History:  Diagnosis Date  . Anxiety   . Arthritis   . Asthma   . Colon polyps   . COPD (chronic obstructive pulmonary disease) ([place])   . Depression   . Diabetes ([place])   . GERD (gastroesophageal reflux disease)   . High cholesterol   . Hyperlipidemia   . Hypertension   . Sleep apnea     Patient Active Problem List   Diagnosis Date Noted  . Colon polyp 06/29/2016  . BP (high blood pressure)  06/29/2016  . Elevated PSA 06/29/2016  . 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 Controlled type 2 diabetes mellitus without complication ([place]) [id number]  . 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 COPD exacerbation ([place]) 06/24/2015  . Obstructive apnea 02/13/2015  . Chronic obstructive pulmonary disease with acute exacerbation ([place]) 06/14/2013  . Chronic bronchitis ([place]) 12/14/2011    Past Surgical History:  Procedure Laterality Date  . BRONCHOSCOPY    . CARDIAC CATHETERIZATION Left 11/04/2016   Procedure: Left Heart Cath and Coronary Angiography;  Surgeon: Teodoro Spray, MD;  Location: [facility];  Service: Cardiovascular;  Laterality: Left;  . CARDIAC CATHETERIZATION    . COLOSTOMY    . ROTATOR CUFF REPAIR Left   . THUMB ARTHROSCOPY  2015  . TONSILLECTOMY  1952  . TONSILLECTOMY      Prior to Admission medications   Medication Sig Start Date End Date Taking? Authorizing Provider  albuterol (PROVENTIL HFA;VENTOLIN HFA) 108 (90 BASE) MCG/ACT inhaler Inhale 2 puffs into the lungs every 6 (six) hours as needed for wheezing or shortness of breath.   Yes Historical Provider, MD  albuterol (PROVENTIL) (2.5 MG/3ML) 0.083% nebulizer solution Take 2.5 mg by nebulization every 6 (six) hours as needed for wheezing or shortness of breath.   Yes Historical Provider, MD  aspirin 81 MG chewable tablet  Chew 81 mg by mouth daily.   Yes Historical Provider, MD  esomeprazole (NEXIUM) 20 MG capsule Take 20 mg by mouth daily.   Yes Historical Provider, MD  fexofenadine (ALLEGRA) 180 MG tablet Take 180 mg by mouth daily as needed for allergies or rhinitis.   Yes Historical Provider, MD  fluticasone (FLONASE) 50 MCG/ACT nasal spray Place 1 spray into both nostrils daily as needed for allergies or rhinitis.   Yes Historical Provider, MD  Fluticasone-Salmeterol (ADVAIR DISKUS) 100-50 MCG/DOSE AEPB Inhale 1 puff into the lungs 2 (two) times daily.   Yes Historical Provider, MD    glimepiride (AMARYL) 1 MG tablet Take 2 mg by mouth daily.   Yes Historical Provider, MD  ibuprofen (ADVIL,MOTRIN) 200 MG tablet Take 200 mg by mouth every 6 (six) hours as needed.   Yes Historical Provider, MD  levalbuterol (XOPENEX) 1.25 MG/3ML nebulizer solution Take 1.25 mg by nebulization every 4 (four) hours as needed for wheezing.   Yes Historical Provider, MD  meloxicam (MOBIC) 7.5 MG tablet Take 7.5 mg by mouth daily.   Yes Historical Provider, MD  metoprolol succinate (TOPROL-XL) 25 MG 24 hr tablet Take 25 mg by mouth daily.   Yes Historical Provider, MD  Multiple Vitamins-Minerals (MULTIVITAMIN PO) Take 1 tablet by mouth daily.   Yes Historical Provider, MD  nitroGLYCERIN (NITROSTAT) 0.4 MG SL tablet Place 0.4 mg under the tongue every 5 (five) minutes as needed for chest pain.   Yes Historical Provider, MD  ranitidine (ZANTAC) 150 MG tablet Take 150 mg by mouth at bedtime as needed for heartburn.   Yes Historical Provider, MD  simvastatin (ZOCOR) 20 MG tablet Take 20 mg by mouth daily.   Yes Historical Provider, MD  triamterene-hydrochlorothiazide (DYAZIDE) 37.5-25 MG per capsule Take 1 capsule by mouth daily.   Yes Historical Provider, MD  glucosamine-chondroitin 500-400 MG tablet Take 1 tablet by mouth daily.    Historical Provider, MD    Allergies Ace inhibitors and Sulfa antibiotics  Family History  Problem Relation Age of Onset  . Heart attack Mother   . Prostate cancer Father   . Leukemia Brother   . Prostate cancer Brother     Social History Social History  Substance Use Topics  . Smoking status: Former Smoker    Packs/day: 1.00    Years: 15.00    Types: Cigarettes  . Smokeless tobacco: Never Used  . Alcohol use 0.6 - 1.2 oz/week    1 - 2 Shots of liquor per week    Review of Systems Constitutional: No fever/chills Eyes: No visual changes. ENT: No sore throat. Cardiovascular:  chest pain. Respiratory: Denies shortness of breath. Gastrointestinal: No  abdominal pain.  No nausea, no vomiting.  No diarrhea.  No constipation. Genitourinary: Negative for dysuria. Musculoskeletal: Negative for back pain. Skin: Negative for rash. Neurological: Weakness, dizziness.  10-point ROS otherwise negative.  ____________________________________________   PHYSICAL EXAM:  VITAL SIGNS: ED Triage Vitals  Enc Vitals Group     BP 11/30/16 0326 (!) 135/56     Pulse Rate 11/30/16 0326 83     Resp 11/30/16 0326 20     Temp 11/30/16 0326 97.6 F (36.4 C)     Temp Source 11/30/16 0326 Oral     SpO2 11/30/16 0326 95 %     Weight 11/30/16 0323 200 lb (90.7 kg)     Height 11/30/16 0323 5\' 6"  (1.676 m)     Head Circumference --      Peak  Flow --      Pain Score 11/30/16 0628 2     Pain Loc --      Pain Edu? --      Excl. in [place]? --     Constitutional: Alert and oriented. Well appearing and in no acute distress. Eyes: Conjunctivae are normal. PERRL. EOMI. Head: Atraumatic. Nose: No congestion/rhinnorhea. Mouth/Throat: Mucous membranes are moist.  Oropharynx non-erythematous. Cardiovascular: Normal rate, regular rhythm. Grossly normal heart sounds.  Good peripheral circulation. Respiratory: Normal respiratory effort.  No retractions. Lungs CTAB. Gastrointestinal: Soft and nontender. No distention. Positive bowel sounds Musculoskeletal: No lower extremity tenderness nor edema.   Neurologic:  Normal speech and language. Cranial nerves II through XII are grossly intact with no focal motor or neuro deficits Skin:  Skin is warm, dry and intact.  Psychiatric: Mood and affect are normal. Speech and behavior are normal.  ____________________________________________   LABS (all labs ordered are listed, but only abnormal results are displayed)  Labs Reviewed  BASIC METABOLIC PANEL - Abnormal; Notable for the following:       Result Value   Potassium 3.4 (*)    BUN 25 (*)    GFR calc non Af Amer 56 (*)    All other components within normal limits    CBC - Abnormal; Notable for the following:    RBC 4.22 (*)    HCT 37.9 (*)    All other components within normal limits  TROPONIN I  TROPONIN I   ____________________________________________  EKG  ED ECG REPORT I, Loney Hering, the attending physician, personally viewed and interpreted this ECG.   Date: 11/30/2016  EKG Time: 324  Rate: 88  Rhythm: normal sinus rhythm  Axis: left axis deviation  Intervals:none  ST&T Change: flipped t waves in lead III  ____________________________________________  RADIOLOGY  Chest x-ray  ____________________________________________   PROCEDURES  Procedure(s) performed: None  Procedures  Critical Care performed: No  ____________________________________________   INITIAL IMPRESSION / ASSESSMENT AND PLAN / ED COURSE  Pertinent labs & imaging results that were available during my care of the patient were reviewed by me and considered in my medical decision making (see chart for details).  This is a 75 year old male who comes into the hospital today with some chest pain and some weakness. The patient had the symptoms after he woke up this evening. He reports that he wanted to get checked out to make sure that everything was okay. The patient's blood work and imaging is unremarkable. I did repeat the patient's troponin that was also unremarkable. I feel that the patient may have had some orthostasis or some vasovagal symptoms given that he had just gone to the bathroom and was walking when he had these symptoms. As he is feeling improved currently I feel it is appropriate for the patient to be discharged home and have him follow-up with his primary care physician. The patient has no further complaints or concerns at this time. He'll be discharged home.  Clinical Course as of Dec 01 743  Wed Nov 30, 2016  0612 No active cardiopulmonary disease. DG Chest 2 View [AW]    Clinical Course User Index [AW] Loney Hering, MD      ____________________________________________   FINAL CLINICAL IMPRESSION(S) / ED DIAGNOSES  Final diagnoses:  Weakness  Dizziness  Chest pain, unspecified type      NEW MEDICATIONS STARTED DURING THIS VISIT:  New Prescriptions   No medications on file     Note:  This document was prepared using Dragon voice recognition software and may include unintentional dictation errors.    Loney Hering, MD 11/30/16 [id number]

## 2016-11-30 NOTE — ED Triage Notes (Signed)
Pt presents to ED with c/o weakness after getting up to use the bathroom this morning. Pt reports numbness to bilateral fingers, dizziness, and shortness of breath. Pt states had cardiac cath performed 3 weeks ago, pt states had mid chest pain yesterday after cutting down tree. Pt denies chest pain this morning.

## 2017-01-30 ENCOUNTER — Ambulatory Visit
Admission: RE | Admit: 2017-01-30 | Discharge: 2017-01-30 | Disposition: A | Discharge status: Home / Self Care | Payer: Medicare Other | Source: Ambulatory Visit | Attending: Internal Medicine | Admitting: Internal Medicine

## 2017-01-30 ENCOUNTER — Other Ambulatory Visit: Payer: Self-pay | Admitting: Internal Medicine

## 2017-01-30 DIAGNOSIS — G451 Carotid artery syndrome (hemispheric): Secondary | ICD-10-CM | POA: Diagnosis not present

## 2017-02-13 ENCOUNTER — Ambulatory Visit
Admission: RE | Admit: 2017-02-13 | Discharge: 2017-02-13 | Disposition: A | Discharge status: Home / Self Care | Payer: Medicare Other | Source: Ambulatory Visit | Attending: Internal Medicine | Admitting: Internal Medicine

## 2017-02-13 ENCOUNTER — Other Ambulatory Visit: Payer: Self-pay | Admitting: Internal Medicine

## 2017-02-13 DIAGNOSIS — R0602 Shortness of breath: Secondary | ICD-10-CM | POA: Insufficient documentation

## 2017-02-13 DIAGNOSIS — R918 Other nonspecific abnormal finding of lung field: Secondary | ICD-10-CM | POA: Insufficient documentation

## 2017-02-13 DIAGNOSIS — R000 Tachycardia, unspecified: Secondary | ICD-10-CM | POA: Insufficient documentation

## 2017-02-13 DIAGNOSIS — M47814 Spondylosis without myelopathy or radiculopathy, thoracic region: Secondary | ICD-10-CM | POA: Diagnosis not present

## 2017-02-13 LAB — POCT I-STAT CREATININE: Creatinine, Ser: 1.6 mg/dL — ABNORMAL HIGH (ref 0.61–1.24)

## 2017-02-13 MED ORDER — IOPAMIDOL (ISOVUE-370) INJECTION 76%
60.0000 mL | Freq: Once | INTRAVENOUS | Status: AC | PRN
  Administered 2017-02-13: 60 mL via INTRAVENOUS

## 2017-02-14 ENCOUNTER — Emergency Department: Payer: Medicare Other

## 2017-02-14 ENCOUNTER — Emergency Department
Admission: EM | Admit: 2017-02-14 | Discharge: 2017-02-14 | Disposition: A | Discharge status: Home / Self Care | Payer: Medicare Other | Attending: Emergency Medicine | Admitting: Emergency Medicine

## 2017-02-14 DIAGNOSIS — Z87891 Personal history of nicotine dependence: Secondary | ICD-10-CM | POA: Diagnosis not present

## 2017-02-14 DIAGNOSIS — I10 Essential (primary) hypertension: Secondary | ICD-10-CM | POA: Insufficient documentation

## 2017-02-14 DIAGNOSIS — R079 Chest pain, unspecified: Secondary | ICD-10-CM | POA: Diagnosis present

## 2017-02-14 DIAGNOSIS — Z7984 Long term (current) use of oral hypoglycemic drugs: Secondary | ICD-10-CM | POA: Diagnosis not present

## 2017-02-14 DIAGNOSIS — E119 Type 2 diabetes mellitus without complications: Secondary | ICD-10-CM | POA: Insufficient documentation

## 2017-02-14 DIAGNOSIS — Z7982 Long term (current) use of aspirin: Secondary | ICD-10-CM | POA: Diagnosis not present

## 2017-02-14 DIAGNOSIS — R000 Tachycardia, unspecified: Secondary | ICD-10-CM | POA: Insufficient documentation

## 2017-02-14 DIAGNOSIS — J449 Chronic obstructive pulmonary disease, unspecified: Secondary | ICD-10-CM | POA: Diagnosis not present

## 2017-02-14 DIAGNOSIS — J45909 Unspecified asthma, uncomplicated: Secondary | ICD-10-CM | POA: Insufficient documentation

## 2017-02-14 DIAGNOSIS — Z79899 Other long term (current) drug therapy: Secondary | ICD-10-CM | POA: Diagnosis not present

## 2017-02-14 LAB — HEPATIC FUNCTION PANEL
ALK PHOS: 63 U/L (ref 38–126)
ALT: 41 U/L (ref 17–63)
AST: 38 U/L (ref 15–41)
Albumin: 4 g/dL (ref 3.5–5.0)
Bilirubin, Direct: 0.1 mg/dL (ref 0.1–0.5)
Indirect Bilirubin: 0.4 mg/dL (ref 0.3–0.9)
TOTAL PROTEIN: 7.6 g/dL (ref 6.5–8.1)
Total Bilirubin: 0.5 mg/dL (ref 0.3–1.2)

## 2017-02-14 LAB — CBC
HEMATOCRIT: 40.7 % (ref 40.0–52.0)
HEMOGLOBIN: 14.4 g/dL (ref 13.0–18.0)
MCH: 31.2 pg (ref 26.0–34.0)
MCHC: 35.4 g/dL (ref 32.0–36.0)
MCV: 88.4 fL (ref 80.0–100.0)
Platelets: 236 10*3/uL (ref 150–440)
RBC: 4.6 MIL/uL (ref 4.40–5.90)
RDW: 14 % (ref 11.5–14.5)
WBC: 7.8 10*3/uL (ref 3.8–10.6)

## 2017-02-14 LAB — BASIC METABOLIC PANEL
ANION GAP: 12 (ref 5–15)
BUN: 33 mg/dL — AB (ref 6–20)
CO2: 22 mmol/L (ref 22–32)
Calcium: 8.7 mg/dL — ABNORMAL LOW (ref 8.9–10.3)
Chloride: 99 mmol/L — ABNORMAL LOW (ref 101–111)
Creatinine, Ser: 1.7 mg/dL — ABNORMAL HIGH (ref 0.61–1.24)
GFR, EST AFRICAN AMERICAN: 44 mL/min — AB (ref >60)
GFR, EST NON AFRICAN AMERICAN: 38 mL/min — AB (ref >60)
Glucose, Bld: 238 mg/dL — ABNORMAL HIGH (ref 65–99)
POTASSIUM: 4.2 mmol/L (ref 3.5–5.1)
SODIUM: 133 mmol/L — AB (ref 135–145)

## 2017-02-14 LAB — TROPONIN I: Troponin I: <0.03 ng/mL (ref <0.03)

## 2017-02-14 LAB — LIPASE, BLOOD: LIPASE: 59 U/L — AB (ref 11–51)

## 2017-02-14 MED ORDER — GI COCKTAIL ~~LOC~~
30.0000 mL | Freq: Once | ORAL | Status: AC
  Administered 2017-02-14: 30 mL via ORAL
  Filled 2017-02-14: qty 30

## 2017-02-14 MED ORDER — ALPRAZOLAM 0.5 MG PO TABS: 0.5000 mg | ORAL_TABLET | Freq: Three times a day (TID) | ORAL | 0 refills | Status: AC | PRN

## 2017-02-14 MED ORDER — LORAZEPAM 2 MG/ML IJ SOLN
0.5000 mg | Freq: Once | INTRAMUSCULAR | Status: AC
  Administered 2017-02-14: 0.5 mg via INTRAVENOUS
  Filled 2017-02-14: qty 1

## 2017-02-14 NOTE — ED Provider Notes (Signed)
[facility] Emergency Department Provider Note   ____________________________________________   I have reviewed the triage vital signs and the nursing notes.   HISTORY  Chief Complaint Chest Pain   History limited by: Not Limited   HPI Brett Blake is a 76 y.o. male who presents to the emergency department today because of concern for chest pain. It is located centrally and in the left chest. It did radiate to his back. The patient has been having intermittent chest pain for about three weeks. [facility] he was admitted [facility]. Enzymes remained negative. He has since followed up with his PCP who ordered a CTA chest for PE which was negative. Patient states that it all started shortly after he ate chicken, and today's episode happened when he was out to lunch.    Past Medical History:  Diagnosis Date  . Anxiety   . Arthritis   . Asthma   . Colon polyps   . COPD (chronic obstructive pulmonary disease) ([place])   . Depression   . Diabetes ([place])   . GERD (gastroesophageal reflux disease)   . High cholesterol   . Hyperlipidemia   . Hypertension   . Sleep apnea     Patient Active Problem List   Diagnosis Date Noted  . Colon polyp 06/29/2016  . BP (high blood pressure) 06/29/2016  . Elevated PSA 06/29/2016  . 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 Controlled type 2 diabetes mellitus without complication ([place]) [id number]  . 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 COPD exacerbation ([place]) 06/24/2015  . Obstructive apnea 02/13/2015  . Chronic obstructive pulmonary disease with acute exacerbation ([place]) 06/14/2013  . Chronic bronchitis ([place]) 12/14/2011    Past Surgical History:  Procedure Laterality Date  . BRONCHOSCOPY    . CARDIAC CATHETERIZATION Left 11/04/2016   Procedure: Left Heart Cath and Coronary Angiography;  Surgeon: Teodoro Spray, MD;  Location: [facility];   Service: Cardiovascular;  Laterality: Left;  . CARDIAC CATHETERIZATION    . COLOSTOMY    . ROTATOR CUFF REPAIR Left   . THUMB ARTHROSCOPY  2015  . TONSILLECTOMY  1952  . TONSILLECTOMY      Prior to Admission medications   Medication Sig Start Date End Date Taking? Authorizing Provider  albuterol (PROVENTIL HFA;VENTOLIN HFA) 108 (90 BASE) MCG/ACT inhaler Inhale 2 puffs into the lungs every 6 (six) hours as needed for wheezing or shortness of breath.    Historical Provider, MD  albuterol (PROVENTIL) (2.5 MG/3ML) 0.083% nebulizer solution Take 2.5 mg by nebulization every 6 (six) hours as needed for wheezing or shortness of breath.    Historical Provider, MD  aspirin 81 MG chewable tablet Chew 81 mg by mouth daily.    Historical Provider, MD  esomeprazole (NEXIUM) 20 MG capsule Take 20 mg by mouth daily.    Historical Provider, MD  fexofenadine (ALLEGRA) 180 MG tablet Take 180 mg by mouth daily as needed for allergies or rhinitis.    Historical Provider, MD  fluticasone (FLONASE) 50 MCG/ACT nasal spray Place 1 spray into both nostrils daily as needed for allergies or rhinitis.    Historical Provider, MD  Fluticasone-Salmeterol (ADVAIR DISKUS) 100-50 MCG/DOSE AEPB Inhale 1 puff into the lungs 2 (two) times daily.    Historical Provider, MD  glimepiride (AMARYL) 1 MG tablet Take 2 mg by mouth daily.    Historical Provider, MD  glucosamine-chondroitin 500-400 MG tablet Take 1 tablet by mouth daily.  Historical Provider, MD  ibuprofen (ADVIL,MOTRIN) 200 MG tablet Take 200 mg by mouth every 6 (six) hours as needed.    Historical Provider, MD  levalbuterol Penne Lash) 1.25 MG/3ML nebulizer solution Take 1.25 mg by nebulization every 4 (four) hours as needed for wheezing.    Historical Provider, MD  meloxicam (MOBIC) 7.5 MG tablet Take 7.5 mg by mouth daily.    Historical Provider, MD  metoprolol succinate (TOPROL-XL) 25 MG 24 hr tablet Take 25 mg by mouth daily.    Historical Provider, MD  Multiple  Vitamins-Minerals (MULTIVITAMIN PO) Take 1 tablet by mouth daily.    Historical Provider, MD  nitroGLYCERIN (NITROSTAT) 0.4 MG SL tablet Place 0.4 mg under the tongue every 5 (five) minutes as needed for chest pain.    Historical Provider, MD  ranitidine (ZANTAC) 150 MG tablet Take 150 mg by mouth at bedtime as needed for heartburn.    Historical Provider, MD  simvastatin (ZOCOR) 20 MG tablet Take 20 mg by mouth daily.    Historical Provider, MD  triamterene-hydrochlorothiazide (DYAZIDE) 37.5-25 MG per capsule Take 1 capsule by mouth daily.    Historical Provider, MD    Allergies Ace inhibitors and Sulfa antibiotics  Family History  Problem Relation Age of Onset  . Heart attack Mother   . Prostate cancer Father   . Leukemia Brother   . Prostate cancer Brother     Social History Social History  Substance Use Topics  . Smoking status: Former Smoker    Packs/day: 1.00    Years: 15.00    Types: Cigarettes  . Smokeless tobacco: Never Used  . Alcohol use 0.6 - 1.2 oz/week    1 - 2 Shots of liquor per week    Review of Systems  Constitutional: Negative for fever. Cardiovascular: Positive for chest pain. Positive for tachycardia.  Respiratory: Negative for shortness of breath. Gastrointestinal: Negative for abdominal pain, vomiting and diarrhea. Genitourinary: Negative for dysuria. Musculoskeletal: Negative for back pain. Skin: Negative for rash. Neurological: Negative for headaches, focal weakness or numbness.  10-point ROS otherwise negative.  ____________________________________________   PHYSICAL EXAM:  VITAL SIGNS: ED Triage Vitals  Enc Vitals Group     BP 02/14/17 1509 (!) 154/94     Pulse Rate 02/14/17 1509 (!) 117     Resp 02/14/17 1509 (!) 28     Temp 02/14/17 1509 98 F (36.7 C)     Temp Source 02/14/17 1509 Oral     SpO2 02/14/17 1509 98 %     Weight 02/14/17 1505 200 lb (90.7 kg)     Height 02/14/17 1505 5\' 6"  (1.676 m)     Head Circumference --       Peak Flow --      Pain Score 02/14/17 1505 8   Constitutional: Alert and oriented. Well appearing and in no distress. Eyes: Conjunctivae are normal. Normal extraocular movements. ENT   Head: Normocephalic and atraumatic.   Nose: No congestion/rhinnorhea.   Mouth/Throat: Mucous membranes are moist.   Neck: No stridor. Hematological/Lymphatic/Immunilogical: No cervical lymphadenopathy. Cardiovascular: Tachycardic, regular rhythm.  No murmurs, rubs, or gallops.  Respiratory: Normal respiratory effort without tachypnea nor retractions. Breath sounds are clear and equal bilaterally. No wheezes/rales/rhonchi. Gastrointestinal: Soft and non tender. No rebound. No guarding.  Genitourinary: Deferred Musculoskeletal: Normal range of motion in all extremities. No lower extremity edema. Neurologic:  Normal speech and language. No gross focal neurologic deficits are appreciated.  Skin:  Skin is warm, dry and intact. No rash noted.  Psychiatric: Mood and affect are normal. Speech and behavior are normal. Patient exhibits appropriate insight and judgment.  ____________________________________________    LABS (pertinent positives/negatives)  Labs Reviewed  BASIC METABOLIC PANEL - Abnormal; Notable for the following:       Result Value   Sodium 133 (*)    Chloride 99 (*)    Glucose, Bld 238 (*)    BUN 33 (*)    Creatinine, Ser 1.70 (*)    Calcium 8.7 (*)    GFR calc non Af Amer 38 (*)    GFR calc Af Amer 44 (*)    All other components within normal limits  CBC  TROPONIN I     ____________________________________________   EKG  I, Nance Pear, attending physician, personally viewed and interpreted this EKG  EKG Time: 1507 Rate: 119 Rhythm: sinus tachycardia Axis: left axis deviation Intervals: qtc 436 QRS: LAFB ST changes: no st elevation Impression: abnormal ekg   ____________________________________________    RADIOLOGY  CXR IMPRESSION:  There is no  active cardiopulmonary disease.    ____________________________________________   PROCEDURES  Procedures  ____________________________________________   INITIAL IMPRESSION / ASSESSMENT AND PLAN / ED COURSE  Pertinent labs & imaging results that were available during my care of the patient were reviewed by me and considered in my medical decision making (see chart for details).  Patient presented to the emergency department today because of concerns for chest pain. Patient has been having episodes of chest pain and tachycardia for few weeks. Had been seen at outside hospitals and had negative workup. Does follow-up with Dr. Ubaldo Glassing. 2 sets of troponins negative here. EKG without concerning findings. Patient was given some Ativan which did help relieve the pain and tachycardia. This point I do think certainly some stress anxiety is a component of the patient's symptoms. Did offer admission but feel that it is reasonable have patient continue outpatient follow-up with Dr. Ubaldo Glassing. Patient stated he would like to follow up as an outpatient. Will give patient prescription for same medications as needed.  ____________________________________________   FINAL CLINICAL IMPRESSION(S) / ED DIAGNOSES  Final diagnoses:  Nonspecific chest pain     Note: This dictation was prepared with Dragon dictation. Any transcriptional errors that result from this process are unintentional     Nance Pear, MD 02/14/17 1958

## 2017-02-14 NOTE — Discharge Instructions (Signed)
Please seek medical attention for any high fevers, chest pain, shortness of breath, change in behavior, persistent vomiting, bloody stool or any other new or concerning symptoms.  

## 2017-02-14 NOTE — ED Triage Notes (Signed)
Pt c/o substernal chest pain with sudden onset with nausea and diaphoresis, pt is on a CM from Dr. Leamon Arnt office from the visit from this morning.. Pt is tachypneic on arrival.. Pt states he took 2 ASA 325mg  pTA

## 2017-02-14 NOTE — ED Notes (Signed)
Pt given cup of water, ok per Dr. Goodman.  

## 2017-02-14 NOTE — ED Notes (Signed)
Pt reports he was out eating and felt like he needed to have a BM - he went to the restroom and became nauseated and went to the car to rest - while in the car he started having chest pain that feels like a tight band squeezing around his chest and radiating into his back - pt was at Dr Ubaldo Glassing this am and placed on cardiac monitor - denies shortness of breath

## 2017-02-23 DIAGNOSIS — I479 Paroxysmal tachycardia, unspecified: Secondary | ICD-10-CM | POA: Insufficient documentation

## 2017-04-21 ENCOUNTER — Emergency Department
Admission: EM | Admit: 2017-04-21 | Discharge: 2017-04-21 | Disposition: A | Discharge status: Home / Self Care | Payer: Medicare Other | Attending: Emergency Medicine | Admitting: Emergency Medicine

## 2017-04-21 ENCOUNTER — Emergency Department: Payer: Medicare Other

## 2017-04-21 DIAGNOSIS — R0789 Other chest pain: Secondary | ICD-10-CM | POA: Insufficient documentation

## 2017-04-21 DIAGNOSIS — R079 Chest pain, unspecified: Secondary | ICD-10-CM

## 2017-04-21 DIAGNOSIS — J45909 Unspecified asthma, uncomplicated: Secondary | ICD-10-CM | POA: Diagnosis not present

## 2017-04-21 DIAGNOSIS — Z87891 Personal history of nicotine dependence: Secondary | ICD-10-CM | POA: Diagnosis not present

## 2017-04-21 DIAGNOSIS — J449 Chronic obstructive pulmonary disease, unspecified: Secondary | ICD-10-CM | POA: Diagnosis not present

## 2017-04-21 DIAGNOSIS — Z79899 Other long term (current) drug therapy: Secondary | ICD-10-CM | POA: Diagnosis not present

## 2017-04-21 DIAGNOSIS — I10 Essential (primary) hypertension: Secondary | ICD-10-CM | POA: Diagnosis not present

## 2017-04-21 DIAGNOSIS — Z7982 Long term (current) use of aspirin: Secondary | ICD-10-CM | POA: Insufficient documentation

## 2017-04-21 DIAGNOSIS — Z7984 Long term (current) use of oral hypoglycemic drugs: Secondary | ICD-10-CM | POA: Diagnosis not present

## 2017-04-21 DIAGNOSIS — E119 Type 2 diabetes mellitus without complications: Secondary | ICD-10-CM | POA: Diagnosis not present

## 2017-04-21 LAB — CBC
HCT: 39.2 % — ABNORMAL LOW (ref 40.0–52.0)
HEMOGLOBIN: 13.5 g/dL (ref 13.0–18.0)
MCH: 30.2 pg (ref 26.0–34.0)
MCHC: 34.4 g/dL (ref 32.0–36.0)
MCV: 88 fL (ref 80.0–100.0)
PLATELETS: 269 10*3/uL (ref 150–440)
RBC: 4.45 MIL/uL (ref 4.40–5.90)
RDW: 15.2 % — AB (ref 11.5–14.5)
WBC: 4.2 10*3/uL (ref 3.8–10.6)

## 2017-04-21 LAB — BASIC METABOLIC PANEL
Anion gap: 9 (ref 5–15)
BUN: 15 mg/dL (ref 6–20)
CALCIUM: 9.1 mg/dL (ref 8.9–10.3)
CO2: 27 mmol/L (ref 22–32)
CREATININE: 1.28 mg/dL — AB (ref 0.61–1.24)
Chloride: 103 mmol/L (ref 101–111)
GFR calc Af Amer: >60 mL/min (ref >60)
GFR calc non Af Amer: 53 mL/min — ABNORMAL LOW (ref >60)
GLUCOSE: 108 mg/dL — AB (ref 65–99)
Potassium: 3.9 mmol/L (ref 3.5–5.1)
Sodium: 139 mmol/L (ref 135–145)

## 2017-04-21 LAB — TROPONIN I
Troponin I: <0.03 ng/mL
Troponin I: <0.03 ng/mL (ref <0.03)

## 2017-04-21 MED ORDER — AZITHROMYCIN 250 MG PO TABS: 250.0000 mg | ORAL_TABLET | Freq: Every day | ORAL | 0 refills | Status: DC

## 2017-04-21 MED ORDER — AZITHROMYCIN 500 MG PO TABS
500.0000 mg | ORAL_TABLET | Freq: Once | ORAL | Status: AC
  Administered 2017-04-21: 500 mg via ORAL
  Filled 2017-04-21: qty 1

## 2017-04-21 MED ORDER — ASPIRIN 81 MG PO CHEW
324.0000 mg | CHEWABLE_TABLET | Freq: Once | ORAL | Status: AC
  Administered 2017-04-21: 324 mg via ORAL
  Filled 2017-04-21: qty 4

## 2017-04-21 NOTE — Discharge Instructions (Signed)
You have been seen in the emergency department today for chest pain. Your workup has shown normal results. As we discussed please follow-up with your primary care physician in the next 1-2 days for recheck. Return to the emergency department for any further chest pain, trouble breathing, or any other symptom personally concerning to yourself. °

## 2017-04-21 NOTE — ED Provider Notes (Signed)
[facility] Emergency Department Provider Note  Time seen: 5:41 PM  I have reviewed the triage vital signs and the nursing notes.   HISTORY  Chief Complaint Chest Pain    HPI Brett Blake is a 76 y.o. male with a past medical history of anxiety, COPD, gastric reflux, diabetes, hypertension, hyperlipidemia who presents to the emergency department for chest pain and a low blood pressure. According to the patient since ran 11:00 this morning he states he has felt a tightness feeling to the center of his chest. Patient states around 2:00 or so today he was also feeling short of breath so he checked his blood pressure. Patient states the blood pressure was reading in the 70s. He believes he then took a nitroglycerin and the blood pressure started coming back up. They called his primary care doctor who recommended he come to the emergency department for evaluation. Currently patient states a mild chest tightness in the center of his chest. Denies any shortness of breath nausea or diaphoresis.Denies any leg pain or swelling. Patient states he has had a recent dry cough which he relates to the pollen.  Past Medical History:  Diagnosis Date  . Anxiety   . Arthritis   . Asthma   . Colon polyps   . COPD (chronic obstructive pulmonary disease) ([place])   . Depression   . Diabetes ([place])   . GERD (gastroesophageal reflux disease)   . High cholesterol   . Hyperlipidemia   . Hypertension   . Sleep apnea     Patient Active Problem List   Diagnosis Date Noted  . Colon polyp 06/29/2016  . BP (high blood pressure) 06/29/2016  . Elevated PSA 06/29/2016  . 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 Controlled type 2 diabetes mellitus without complication ([place]) 80/[id number]/8338  . 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 COPD exacerbation ([place]) 06/24/2015  . Obstructive apnea 02/13/2015  . Chronic obstructive pulmonary disease with acute exacerbation ([place])  06/14/2013  . Chronic bronchitis ([place]) 12/14/2011    Past Surgical History:  Procedure Laterality Date  . BRONCHOSCOPY    . CARDIAC CATHETERIZATION Left 11/04/2016   Procedure: Left Heart Cath and Coronary Angiography;  Surgeon: Teodoro Spray, MD;  Location: [facility];  Service: Cardiovascular;  Laterality: Left;  . CARDIAC CATHETERIZATION    . COLOSTOMY    . ROTATOR CUFF REPAIR Left   . THUMB ARTHROSCOPY  2015  . TONSILLECTOMY  1952  . TONSILLECTOMY      Prior to Admission medications   Medication Sig Start Date End Date Taking? Authorizing Provider  albuterol (PROVENTIL HFA;VENTOLIN HFA) 108 (90 BASE) MCG/ACT inhaler Inhale 2 puffs into the lungs every 6 (six) hours as needed for wheezing or shortness of breath.    Historical Provider, MD  ALPRAZolam Duanne Moron) 0.5 MG tablet Take 1 tablet (0.5 mg total) by mouth 3 (three) times daily as needed for sleep or anxiety. 02/14/17 02/14/18  Nance Pear, MD  aspirin 325 MG tablet Take 650 mg by mouth daily.    Historical Provider, MD  aspirin 81 MG chewable tablet Chew 81 mg by mouth daily.    Historical Provider, MD  diltiazem (TIAZAC) 180 MG 24 hr capsule Take 180 mg by mouth 2 (two) times daily. 02/13/17 05/14/17  Historical Provider, MD  esomeprazole (NEXIUM) 20 MG capsule Take 20 mg by mouth daily.    Historical Provider, MD  fexofenadine (ALLEGRA) 180 MG tablet Take 180 mg by mouth daily as  needed for allergies or rhinitis.    Historical Provider, MD  fluticasone (FLONASE) 50 MCG/ACT nasal spray Place 1 spray into both nostrils daily as needed for allergies or rhinitis.    Historical Provider, MD  glimepiride (AMARYL) 1 MG tablet Take 1 mg by mouth daily.     Historical Provider, MD  glucosamine-chondroitin 500-400 MG tablet Take 1 tablet by mouth daily.    Historical Provider, MD  ibuprofen (ADVIL,MOTRIN) 200 MG tablet Take 200 mg by mouth every 6 (six) hours as needed.    Historical Provider, MD  metoprolol succinate  (TOPROL-XL) 25 MG 24 hr tablet Take 25 mg by mouth daily.    Historical Provider, MD  montelukast (SINGULAIR) 10 MG tablet Take 10 mg by mouth at bedtime.    Historical Provider, MD  Multiple Vitamins-Minerals (MULTIVITAMIN PO) Take 1 tablet by mouth daily.    Historical Provider, MD  nitroGLYCERIN (NITROSTAT) 0.4 MG SL tablet Place 0.4 mg under the tongue every 5 (five) minutes as needed for chest pain.    Historical Provider, MD  ranitidine (ZANTAC) 150 MG tablet Take 150 mg by mouth at bedtime as needed for heartburn.    Historical Provider, MD  simvastatin (ZOCOR) 20 MG tablet Take 20 mg by mouth daily.    Historical Provider, MD  triamterene-hydrochlorothiazide (DYAZIDE) 37.5-25 MG per capsule Take 1 capsule by mouth daily.    Historical Provider, MD    Allergies  Allergen Reactions  . Ace Inhibitors Cough  . Sulfa Antibiotics Rash    Family History  Problem Relation Age of Onset  . Heart attack Mother   . Prostate cancer Father   . Leukemia Brother   . Prostate cancer Brother     Social History Social History  Substance Use Topics  . Smoking status: Former Smoker    Packs/day: 1.00    Years: 15.00    Types: Cigarettes  . Smokeless tobacco: Never Used  . Alcohol use 0.6 - 1.2 oz/week    1 - 2 Shots of liquor per week    Review of Systems Constitutional: Negative for fever. Eyes: Negative for visual changes. ENT: Negative for congestion Cardiovascular: Positive for chest tightness, improved at this point. Respiratory: Patient states shortness of breath earlier which has now resolved Gastrointestinal: Negative for abdominal pain, vomiting Genitourinary: Negative for dysuria. Musculoskeletal: Negative for back pain. Negative for leg pain Skin: Negative for rash. Neurological: Negative for headache All other ROS negative  ____________________________________________   PHYSICAL EXAM:  VITAL SIGNS: ED Triage Vitals  Enc Vitals Group     BP 04/21/17 1511 122/79      Pulse Rate 04/21/17 1511 85     Resp 04/21/17 1511 18     Temp 04/21/17 1511 97.9 F (36.6 C)     Temp Source 04/21/17 1511 Oral     SpO2 04/21/17 1511 98 %     Weight --      Height --      Head Circumference --      Peak Flow --      Pain Score 04/21/17 1506 5     Pain Loc --      Pain Edu? --      Excl. in [place]? --     Constitutional: Alert and oriented. Well appearing and in no distress. Eyes: Normal exam ENT   Head: Normocephalic and atraumatic.   Nose: No congestion/rhinnorhea.   Mouth/Throat: Mucous membranes are moist. Cardiovascular: Normal rate, regular rhythm. No murmur Respiratory: Normal respiratory  effort without tachypnea nor retractions. Breath sounds are clear and equal bilaterally. No wheezes/rales/rhonchi. Frequent dry cough during exam. Gastrointestinal: Soft and nontender. No distention.   Musculoskeletal: Nontender with normal range of motion in all extremities. No lower extremity tenderness or edema. Neurologic:  Normal speech and language. No gross focal neurologic deficits  Skin:  Skin is warm, dry and intact.  Psychiatric: Mood and affect are normal.  ____________________________________________    EKG  EKG reviewed and interpreted by myself shows normal sinus rhythm at 85 bpm, narrow QRS, left axis deviation, normal intervals. Nonspecific ST changes but no definitive ST elevation or depression.  ____________________________________________    RADIOLOGY  Chest x-ray negative  ____________________________________________   INITIAL IMPRESSION / ASSESSMENT AND PLAN / ED COURSE  Pertinent labs & imaging results that were available during my care of the patient were reviewed by me and considered in my medical decision making (see chart for details).  Patient presents to the emergency department for chest discomfort and shortness of breath initially found to be hypotensive at home. Upon arrival the patient appears well with a normal  blood pressure. Repeat blood pressures have remained normal to hypertensive. Patient states mild chest tightness in the center of his chest but he believes this is due to allergies or an infection given his frequent cough over the past few days. Patient's labs are largely at his baseline. Troponin is negative. Patient states he had a cardiac catheterization performed approximate 5 months ago with no stents placed. Given the patient's comorbidities and symptoms I discussed admission, versus 2 sets of troponins with close outpatient follow-up. Admission for further workup was offered to the patient, however the Patient states he would strongly wish to go home. He is agreeable to stay for a second set of cardiac enzymes. If his second set is normal and the patient remains fairly asymptomatic in the emergency department I believe it would be safe for the patient to be discharged home with close follow-up with his cardiologist at [facility].  Patient's repeat troponin is negative. Patient states he feels well denies any pain currently. Wishes to go home and follow-up with his cardiologist.  ____________________________________________   FINAL CLINICAL IMPRESSION(S) / ED DIAGNOSES  Chest pain    Harvest Dark, MD 04/21/17 1950

## 2017-04-21 NOTE — ED Triage Notes (Signed)
Pt alert and oriented in wheelchair. Pt states today central CP began. States "I couldn't breath at home, I think the pollen got me." Pt states his BP at home was 01'B systolic. Pt states he took 1 nitro SL. Pt states baby ASA in AM.

## 2017-05-08 ENCOUNTER — Encounter: Payer: Medicare Other | Attending: Internal Medicine | Admitting: *Deleted

## 2017-05-08 ENCOUNTER — Encounter: Payer: Self-pay | Admitting: *Deleted

## 2017-05-08 VITALS — Ht 65.2 in | Wt 196.5 lb

## 2017-05-08 DIAGNOSIS — I208 Other forms of angina pectoris: Secondary | ICD-10-CM | POA: Insufficient documentation

## 2017-05-08 NOTE — Progress Notes (Signed)
Daily Session Note  Patient Details  Name: Brett Blake MRN: [id number] Date of Birth: [birth year] Referring Provider:    Encounter Date: 05/08/2017  Check In:     Session Check In - 05/08/17 1233      Check-In   Location [facility]   Staff Present Heath Lark, RN, BSN, CCRP;Jessica [place], MA, ACSM RCEP, Exercise Physiologist;Meridith Sherryll Burger, RN BSN   Supervising physician immediately available to respond to emergencies See telemetry face sheet for immediately available ER MD   Warm-up and Cool-down Performed as group-led instruction   Resistance Training Performed Yes   VAD Patient? No     Pain Assessment   Currently in Pain? No/denies         History  Smoking Status  . Former Smoker  . Packs/day: 1.00  . Years: 15.00  . Types: Cigarettes  Smokeless Tobacco  . Never Used    Comment: quit 1977    Goals Met:  Personal goals reviewed No report of cardiac concerns or symptoms Strength training completed today  Goals Unmet:  Not Applicable  Comments: Medical Review completed today.    Dr. Emily Filbert is Medical Director for [place] and [facility].

## 2017-05-08 NOTE — Patient Instructions (Signed)
Patient Instructions  Patient Details  Name: Brett Blake MRN: [id number] Date of Birth: 15-Nov-1941 Referring Provider:  [name], MD  Below are the personal goals you chose as well as exercise and nutrition goals. Our goal is to help you keep on track towards obtaining and maintaining your goals. We will be discussing your progress on these goals with you throughout the program.  Initial Exercise Prescription:     Initial Exercise Prescription - 05/08/17 1300      Date of Initial Exercise RX and Referring Provider   Date 05/08/17   Referring Provider Emily Filbert MD     Treadmill   MPH 2.5   Grade 0.5   Minutes 15   METs 3.09     Recumbant Bike   Level 2   RPM 25   Watts 28   Minutes 15   METs 2.87     NuStep   Level 2   SPM 80   Minutes 15   METs 2     Prescription Details   Frequency (times per week) 3   Duration Progress to 45 minutes of aerobic exercise without signs/symptoms of physical distress     Intensity   THRR 40-80% of Max Heartrate 109-134   Ratings of Perceived Exertion 11-13   Perceived Dyspnea 0-4     Progression   Progression Continue to progress workloads to maintain intensity without signs/symptoms of physical distress.     Resistance Training   Training Prescription Yes   Weight 3 lbs   Reps 10-15      Exercise Goals: Frequency: Be able to perform aerobic exercise three times per week working toward 3-5 days per week.  Intensity: Work with a perceived exertion of 11 (fairly light) - 15 (hard) as tolerated. Follow your new exercise prescription and watch for changes in prescription as you progress with the program. Changes will be reviewed with you when they are made.  Duration: You should be able to do 30 minutes of continuous aerobic exercise in addition to a 5 minute warm-up and a 5 minute cool-down routine.  Nutrition Goals: Your personal nutrition goals will be established when you do your nutrition analysis with the  dietician.  The following are nutrition guidelines to follow: Cholesterol < 200mg /day Sodium < 1500mg /day Fiber: Men over 50 yrs - 30 grams per day  Personal Goals:     Personal Goals and Risk Factors at Admission - 05/08/17 1237      Core Components/Risk Factors/Patient Goals on Admission    Weight Management Yes;Obesity;Weight Loss   Intervention Weight Management/Obesity: Establish reasonable short term and long term weight goals.;Obesity: Provide education and appropriate resources to help participant work on and attain dietary goals.   Admit Weight 196 lb 8 oz (89.1 kg)   Goal Weight: Short Term 193 lb (87.5 kg)   Goal Weight: Long Term 170 lb (77.1 kg)   Expected Outcomes Short Term: Continue to assess and modify interventions until short term weight is achieved;Long Term: Adherence to nutrition and physical activity/exercise program aimed toward attainment of established weight goal;Weight Maintenance: Understanding of the daily nutrition guidelines, which includes 25-35% calories from fat, 7% or less cal from saturated fats, less than 200mg  cholesterol, less than 1.5gm of sodium, & 5 or more servings of fruits and vegetables daily;Weight Loss: Understanding of general recommendations for a balanced deficit meal plan, which promotes 1-2 lb weight loss per week and includes a negative energy balance of [phone] kcal/d  Weighed 213  lb in December 2017    Diabetes Yes   Intervention Provide education about signs/symptoms and action to take for hypo/hyperglycemia.;Provide education about proper nutrition, including hydration, and aerobic/resistive exercise prescription along with prescribed medications to achieve blood glucose in normal ranges: Fasting glucose 65-99 mg/dL   Expected Outcomes Short Term: Participant verbalizes understanding of the signs/symptoms and immediate care of hyper/hypoglycemia, proper foot care and importance of medication, aerobic/resistive exercise and nutrition  plan for blood glucose control.;Long Term: Attainment of HbA1C < 7%.   Hypertension Yes   Intervention Provide education on lifestyle modifcations including regular physical activity/exercise, weight management, moderate sodium restriction and increased consumption of fresh fruit, vegetables, and low fat dairy, alcohol moderation, and smoking cessation.;Monitor prescription use compliance.   Expected Outcomes Short Term: Continued assessment and intervention until BP is < 140/52mm HG in hypertensive participants. < 130/82mm HG in hypertensive participants with diabetes, heart failure or chronic kidney disease.;Long Term: Maintenance of blood pressure at goal levels.   Lipids Yes   Intervention Provide education and support for participant on nutrition & aerobic/resistive exercise along with prescribed medications to achieve LDL 70mg , HDL >40mg .   Expected Outcomes Short Term: Participant states understanding of desired cholesterol values and is compliant with medications prescribed. Participant is following exercise prescription and nutrition guidelines.;Long Term: Cholesterol controlled with medications as prescribed, with individualized exercise RX and with personalized nutrition plan. Value goals: LDL < 70mg , HDL > 40 mg.      Tobacco Use Initial Evaluation: History  Smoking Status  . Former Smoker  . Packs/day: 1.00  . Years: 15.00  . Types: Cigarettes  Smokeless Tobacco  . Never Used    Comment: quit 1977    Copy of goals given to participant.

## 2017-05-08 NOTE — Progress Notes (Signed)
Cardiac Individual Treatment Plan  Patient Details  Name: Brett Blake MRN: [id number] Date of Birth: March 06, 1941 Referring Provider:     Cardiac Rehab from 05/08/2017 in [facility] Cardiac and Pulmonary Rehab  Referring Provider  Emily Filbert MD      Initial Encounter Date:    Cardiac Rehab from 05/08/2017 in [facility] Cardiac and Pulmonary Rehab  Date  05/08/17  Referring Provider  Emily Filbert MD      Visit Diagnosis: Stable angina [facility])  Patient's Home Medications on Admission:  Current Outpatient Prescriptions:  .  ALPRAZolam (XANAX) 0.5 MG tablet, Take 1 tablet (0.5 mg total) by mouth 3 (three) times daily as needed for sleep or anxiety., Disp: 15 tablet, Rfl: 0 .  amLODipine (NORVASC) 5 MG tablet, Take 5 mg by mouth daily., Disp: , Rfl: 3 .  aspirin 81 MG chewable tablet, Chew 81 mg by mouth daily., Disp: , Rfl:  .  bisoprolol (ZEBETA) 5 MG tablet, Take 5 mg by mouth 2 (two) times daily., Disp: , Rfl:  .  esomeprazole (NEXIUM) 20 MG capsule, Take 20 mg by mouth daily., Disp: , Rfl:  .  fexofenadine (ALLEGRA) 180 MG tablet, Take 180 mg by mouth daily as needed for allergies or rhinitis., Disp: , Rfl:  .  fluticasone (FLONASE) 50 MCG/ACT nasal spray, Place 1 spray into both nostrils daily as needed for allergies or rhinitis., Disp: , Rfl:  .  glimepiride (AMARYL) 1 MG tablet, Take 1 mg by mouth daily. , Disp: , Rfl:  .  glucosamine-chondroitin 500-400 MG tablet, Take 1 tablet by mouth daily., Disp: , Rfl:  .  ibuprofen (ADVIL,MOTRIN) 200 MG tablet, Take 200 mg by mouth every 6 (six) hours as needed., Disp: , Rfl:  .  metoprolol succinate (TOPROL-XL) 25 MG 24 hr tablet, Take 25 mg by mouth daily., Disp: , Rfl:  .  montelukast (SINGULAIR) 10 MG tablet, Take 10 mg by mouth at bedtime., Disp: , Rfl:  .  Multiple Vitamins-Minerals (MULTIVITAMIN PO), Take 1 tablet by mouth daily., Disp: , Rfl:  .  nitroGLYCERIN (NITROSTAT) 0.4 MG SL tablet, Place 0.4 mg under the tongue every 5 (five) minutes  as needed for chest pain., Disp: , Rfl:  .  ranitidine (ZANTAC) 150 MG tablet, Take 150 mg by mouth at bedtime as needed for heartburn., Disp: , Rfl:  .  simvastatin (ZOCOR) 20 MG tablet, Take 20 mg by mouth daily., Disp: , Rfl:  .  triamterene-hydrochlorothiazide (DYAZIDE) 37.5-25 MG per capsule, Take 1 capsule by mouth daily., Disp: , Rfl:  .  azithromycin (ZITHROMAX) 250 MG tablet, Take 1 tablet (250 mg total) by mouth daily. (Patient not taking: Reported on 05/08/2017), Disp: 4 each, Rfl: 0  Past Medical History: Past Medical History:  Diagnosis Date  . Anxiety   . Arthritis   . Asthma   . Colon polyps   . COPD (chronic obstructive pulmonary disease) ([place])   . Depression   . Diabetes ([place])   . GERD (gastroesophageal reflux disease)   . High cholesterol   . Hyperlipidemia   . Hypertension   . Sleep apnea     Tobacco Use: History  Smoking Status  . Former Smoker  . Packs/day: 1.00  . Years: 15.00  . Types: Cigarettes  Smokeless Tobacco  . Never Used    Comment: quit 1977    Labs: Recent Review Flowsheet Data    Labs for ITP Cardiac and Pulmonary Rehab Latest Ref Rng & Units 06/26/2015 06/27/2015  Hemoglobin A1c 4.0 - 6.0 % 5.9 6.1(H)       Exercise Target Goals: Date: 05/08/17  Exercise Program Goal: Individual exercise prescription set with THRR, safety & activity barriers. Participant demonstrates ability to understand and report RPE using BORG scale, to self-measure pulse accurately, and to acknowledge the importance of the exercise prescription.  Exercise Prescription Goal: Starting with aerobic activity 30 plus minutes a day, 3 days per week for initial exercise prescription. Provide home exercise prescription and guidelines that participant acknowledges understanding prior to discharge.  Activity Barriers & Risk Stratification:     Activity Barriers & Cardiac Risk Stratification - 05/08/17 1245      Activity Barriers & Cardiac Risk Stratification    Activity Barriers Shortness of Breath;Chest Pain/Angina;Joint Problems;Deconditioning;Muscular Weakness;Balance Concerns  Possibly combination of COPD and angina, R shoulder pain    Cardiac Risk Stratification High      6 Minute Walk:     6 Minute Walk    Row Name 05/08/17 1334         6 Minute Walk   Phase Initial     Distance 1600 feet     Walk Time 6 minutes     # of Rest Breaks 0     MPH 3.03     METS 2.97     RPE 9     Perceived Dyspnea  1     VO2 Peak 10.4     Symptoms Yes (comment)     Comments SOB     Resting HR 74 bpm     Resting BP 132/66     Max Ex. HR 107 bpm     Max Ex. BP 132/64     2 Minute Post BP 126/60        Oxygen Initial Assessment:   Oxygen Re-Evaluation:   Oxygen Discharge (Final Oxygen Re-Evaluation):   Initial Exercise Prescription:     Initial Exercise Prescription - 05/08/17 1300      Date of Initial Exercise RX and Referring Provider   Date 05/08/17   Referring Provider Emily Filbert MD     Treadmill   MPH 2.5   Grade 0.5   Minutes 15   METs 3.09     Recumbant Bike   Level 2   RPM 25   Watts 28   Minutes 15   METs 2.87     NuStep   Level 2   SPM 80   Minutes 15   METs 2     Prescription Details   Frequency (times per week) 3   Duration Progress to 45 minutes of aerobic exercise without signs/symptoms of physical distress     Intensity   THRR 40-80% of Max Heartrate 109-134   Ratings of Perceived Exertion 11-13   Perceived Dyspnea 0-4     Progression   Progression Continue to progress workloads to maintain intensity without signs/symptoms of physical distress.     Resistance Training   Training Prescription Yes   Weight 3 lbs   Reps 10-15      Perform Capillary Blood Glucose checks as needed.  Exercise Prescription Changes:     Exercise Prescription Changes    Row Name 05/08/17 1200             Response to Exercise   Blood Pressure (Admit) 132/66       Blood Pressure (Exercise) 132/64        Blood Pressure (Exit) 126/60       Heart Rate (Admit)  85 bpm       Heart Rate (Exercise) 107 bpm       Heart Rate (Exit) 92 bpm       Oxygen Saturation (Admit) 97 %       Oxygen Saturation (Exercise) 98 %       Rating of Perceived Exertion (Exercise) 9       Symptoms SOB       Comments walk test results          Exercise Comments:   Exercise Goals and Review:     Exercise Goals    Row Name 05/08/17 1338             Exercise Goals   Increase Physical Activity Yes       Intervention Provide advice, education, support and counseling about physical activity/exercise needs.;Develop an individualized exercise prescription for aerobic and resistive training based on initial evaluation findings, risk stratification, comorbidities and participant's personal goals.       Expected Outcomes Achievement of increased cardiorespiratory fitness and enhanced flexibility, muscular endurance and strength shown through measurements of functional capacity and personal statement of participant.       Increase Strength and Stamina Yes       Intervention Provide advice, education, support and counseling about physical activity/exercise needs.;Develop an individualized exercise prescription for aerobic and resistive training based on initial evaluation findings, risk stratification, comorbidities and participant's personal goals.       Expected Outcomes Achievement of increased cardiorespiratory fitness and enhanced flexibility, muscular endurance and strength shown through measurements of functional capacity and personal statement of participant.          Exercise Goals Re-Evaluation :   Discharge Exercise Prescription (Final Exercise Prescription Changes):     Exercise Prescription Changes - 05/08/17 1200      Response to Exercise   Blood Pressure (Admit) 132/66   Blood Pressure (Exercise) 132/64   Blood Pressure (Exit) 126/60   Heart Rate (Admit) 85 bpm   Heart Rate (Exercise) 107 bpm    Heart Rate (Exit) 92 bpm   Oxygen Saturation (Admit) 97 %   Oxygen Saturation (Exercise) 98 %   Rating of Perceived Exertion (Exercise) 9   Symptoms SOB   Comments walk test results      Nutrition:  Target Goals: Understanding of nutrition guidelines, daily intake of sodium 1500mg , cholesterol 200mg , calories 30% from fat and 7% or less from saturated fats, daily to have 5 or more servings of fruits and vegetables.  Biometrics:     Pre Biometrics - 05/08/17 1338      Pre Biometrics   Height 5' 5.2" (1.656 m)   Weight 196 lb 8 oz (89.1 kg)   Waist Circumference 43.5 inches   Hip Circumference 40.5 inches   Waist to Hip Ratio 1.07 %   BMI (Calculated) 32.6   Single Leg Stand 12.17 seconds       Nutrition Therapy Plan and Nutrition Goals:     Nutrition Therapy & Goals - 05/08/17 1236      Intervention Plan   Intervention Prescribe, educate and counsel regarding individualized specific dietary modifications aiming towards targeted core components such as weight, hypertension, lipid management, diabetes, heart failure and other comorbidities.   Expected Outcomes Short Term Goal: Understand basic principles of dietary content, such as calories, fat, sodium, cholesterol and nutrients.;Short Term Goal: A plan has been developed with personal nutrition goals set during dietitian appointment.;Long Term Goal: Adherence to prescribed nutrition plan.  Nutrition Discharge: Rate Your Plate Scores:     Nutrition Assessments - 05/08/17 1237      MEDFICTS Scores   Pre Score 36      Nutrition Goals Re-Evaluation:   Nutrition Goals Discharge (Final Nutrition Goals Re-Evaluation):   Psychosocial: Target Goals: Acknowledge presence or absence of significant depression and/or stress, maximize coping skills, provide positive support system. Participant is able to verbalize types and ability to use techniques and skills needed for reducing stress and depression.   Initial  Review & Psychosocial Screening:     Initial Psych Review & Screening - 05/08/17 1240      Initial Review   Current issues with Current Anxiety/Panic;Current Sleep Concerns  Anxiety when he is short of breath     Family Dynamics   Good Support System? Yes  Doristine Bosworth, wife     Barriers   Psychosocial barriers to participate in program There are no identifiable barriers or psychosocial needs.     Screening Interventions   Interventions Encouraged to exercise;Provide feedback about the scores to participant;Program counselor consult      Quality of Life Scores:      Quality of Life - 05/08/17 1242      Quality of Life Scores   Health/Function Pre 14.7 %   Socioeconomic Pre 23.25 %   Psych/Spiritual Pre 27.93 %   Family Pre 18 %   GLOBAL Pre 19.58 %      PHQ-9: Recent Review Flowsheet Data    Depression screen [facility] 2/9 05/08/2017   Decreased Interest 1   Down, Depressed, Hopeless 0   PHQ - 2 Score 1   Altered sleeping 2    Tired, decreased energy 2   Change in appetite 3    Feeling bad or failure about yourself  0   Trouble concentrating 1   Moving slowly or fidgety/restless 0   Suicidal thoughts 0   PHQ-9 Score 9   Difficult doing work/chores Somewhat difficult      Interpretation of Total Score  Total Score Depression Severity:  1-4 = Minimal depression, 5-9 = Mild depression, 10-14 = Moderate depression, 15-19 = Moderately severe depression, 20-27 = Severe depression   Psychosocial Evaluation and Intervention:   Psychosocial Re-Evaluation:   Psychosocial Discharge (Final Psychosocial Re-Evaluation):   Vocational Rehabilitation: Provide vocational rehab assistance to qualifying candidates.   Vocational Rehab Evaluation & Intervention:     Vocational Rehab - 05/08/17 1232      Initial Vocational Rehab Evaluation & Intervention   Assessment shows need for Vocational Rehabilitation No      Education: Education Goals: Education classes will be  provided on a weekly basis, covering required topics. Participant will state understanding/return demonstration of topics presented.  Learning Barriers/Preferences:     Learning Barriers/Preferences - 05/08/17 1231      Learning Barriers/Preferences   Learning Barriers Hearing   Learning Preferences Individual Instruction      Education Topics: General Nutrition Guidelines/Fats and Fiber: -Group instruction provided by verbal, written material, models and posters to present the general guidelines for heart healthy nutrition. Gives an explanation and review of dietary fats and fiber.   Controlling Sodium/Reading Food Labels: -Group verbal and written material supporting the discussion of sodium use in heart healthy nutrition. Review and explanation with models, verbal and written materials for utilization of the food label.   Exercise Physiology & Risk Factors: - Group verbal and written instruction with models to review the exercise physiology of the cardiovascular system and associated critical  values. Details cardiovascular disease risk factors and the goals associated with each risk factor.   Aerobic Exercise & Resistance Training: - Gives group verbal and written discussion on the health impact of inactivity. On the components of aerobic and resistive training programs and the benefits of this training and how to safely progress through these programs.   Flexibility, Balance, General Exercise Guidelines: - Provides group verbal and written instruction on the benefits of flexibility and balance training programs. Provides general exercise guidelines with specific guidelines to those with heart or lung disease. Demonstration and skill practice provided.   Stress Management: - Provides group verbal and written instruction about the health risks of elevated stress, cause of high stress, and healthy ways to reduce stress.   Depression: - Provides group verbal and written  instruction on the correlation between heart/lung disease and depressed mood, treatment options, and the stigmas associated with seeking treatment.   Anatomy & Physiology of the Heart: - Group verbal and written instruction and models provide basic cardiac anatomy and physiology, with the coronary electrical and arterial systems. Review of: AMI, Angina, Valve disease, Heart Failure, Cardiac Arrhythmia, Pacemakers, and the ICD.   Cardiac Procedures: - Group verbal and written instruction and models to describe the testing methods done to diagnose heart disease. Reviews the outcomes of the test results. Describes the treatment choices: Medical Management, Angioplasty, or Coronary Bypass Surgery.   Cardiac Medications: - Group verbal and written instruction to review commonly prescribed medications for heart disease. Reviews the medication, class of the drug, and side effects. Includes the steps to properly store meds and maintain the prescription regimen.   Go Sex-Intimacy & Heart Disease, Get SMART - Goal Setting: - Group verbal and written instruction through game format to discuss heart disease and the return to sexual intimacy. Provides group verbal and written material to discuss and apply goal setting through the application of the S.M.A.R.T. Method.   Other Matters of the Heart: - Provides group verbal, written materials and models to describe Heart Failure, Angina, Valve Disease, and Diabetes in the realm of heart disease. Includes description of the disease process and treatment options available to the cardiac patient.   Exercise & Equipment Safety: - Individual verbal instruction and demonstration of equipment use and safety with use of the equipment.   Cardiac Rehab from 05/08/2017 in [facility] Cardiac and Pulmonary Rehab  Date  05/08/17  Educator  SB  Instruction Review Code  2- meets goals/outcomes      Infection Prevention: - Provides verbal and written material to individual  with discussion of infection control including proper hand washing and proper equipment cleaning during exercise session.   Cardiac Rehab from 05/08/2017 in [facility] Cardiac and Pulmonary Rehab  Date  05/08/17  Educator  SB  Instruction Review Code  2- meets goals/outcomes      Falls Prevention: - Provides verbal and written material to individual with discussion of falls prevention and safety.   Cardiac Rehab from 05/08/2017 in [facility] Cardiac and Pulmonary Rehab  Date  05/08/17  Educator  SB  Instruction Review Code  2- meets goals/outcomes      Diabetes: - Individual verbal and written instruction to review signs/symptoms of diabetes, desired ranges of glucose level fasting, after meals and with exercise. Advice that pre and post exercise glucose checks will be done for 3 sessions at entry of program.   Cardiac Rehab from 05/08/2017 in [facility] Cardiac and Pulmonary Rehab  Date  05/08/17  Educator  [facility]  Instruction Review Code  2- meets goals/outcomes       Knowledge Questionnaire Score:     Knowledge Questionnaire Score - 05/08/17 1231      Knowledge Questionnaire Score   Pre Score 24/28  Reviewed correct answers with Shanon Brow, he acknowledged understanding      Core Components/Risk Factors/Patient Goals at Admission:     Personal Goals and Risk Factors at Admission - 05/08/17 1237      Core Components/Risk Factors/Patient Goals on Admission    Weight Management Yes;Obesity;Weight Loss   Intervention Weight Management/Obesity: Establish reasonable short term and long term weight goals.;Obesity: Provide education and appropriate resources to help participant work on and attain dietary goals.   Admit Weight 196 lb 8 oz (89.1 kg)   Goal Weight: Short Term 193 lb (87.5 kg)   Goal Weight: Long Term 170 lb (77.1 kg)   Expected Outcomes Short Term: Continue to assess and modify interventions until short term weight is achieved;Long Term: Adherence to nutrition and physical  activity/exercise program aimed toward attainment of established weight goal;Weight Maintenance: Understanding of the daily nutrition guidelines, which includes 25-35% calories from fat, 7% or less cal from saturated fats, less than 200mg  cholesterol, less than 1.5gm of sodium, & 5 or more servings of fruits and vegetables daily;Weight Loss: Understanding of general recommendations for a balanced deficit meal plan, which promotes 1-2 lb weight loss per week and includes a negative energy balance of [phone] kcal/d  Weighed 213 lb in December 2017    Diabetes Yes   Intervention Provide education about signs/symptoms and action to take for hypo/hyperglycemia.;Provide education about proper nutrition, including hydration, and aerobic/resistive exercise prescription along with prescribed medications to achieve blood glucose in normal ranges: Fasting glucose 65-99 mg/dL   Expected Outcomes Short Term: Participant verbalizes understanding of the signs/symptoms and immediate care of hyper/hypoglycemia, proper foot care and importance of medication, aerobic/resistive exercise and nutrition plan for blood glucose control.;Long Term: Attainment of HbA1C < 7%.   Hypertension Yes   Intervention Provide education on lifestyle modifcations including regular physical activity/exercise, weight management, moderate sodium restriction and increased consumption of fresh fruit, vegetables, and low fat dairy, alcohol moderation, and smoking cessation.;Monitor prescription use compliance.   Expected Outcomes Short Term: Continued assessment and intervention until BP is < 140/60mm HG in hypertensive participants. < 130/60mm HG in hypertensive participants with diabetes, heart failure or chronic kidney disease.;Long Term: Maintenance of blood pressure at goal levels.   Lipids Yes   Intervention Provide education and support for participant on nutrition & aerobic/resistive exercise along with prescribed medications to achieve LDL  70mg , HDL >40mg .   Expected Outcomes Short Term: Participant states understanding of desired cholesterol values and is compliant with medications prescribed. Participant is following exercise prescription and nutrition guidelines.;Long Term: Cholesterol controlled with medications as prescribed, with individualized exercise RX and with personalized nutrition plan. Value goals: LDL < 70mg , HDL > 40 mg.      Core Components/Risk Factors/Patient Goals Review:    Core Components/Risk Factors/Patient Goals at Discharge (Final Review):    ITP Comments:     ITP Comments    Row Name 05/08/17 1235           ITP Comments Medical review completed. Initial ITP created. Documentation of diagnosis can be found in Care Everywhere 04/26/17 Office note.           Comments: Initial ITP

## 2017-05-15 ENCOUNTER — Encounter: Payer: Medicare Other | Admitting: *Deleted

## 2017-05-15 DIAGNOSIS — I208 Other forms of angina pectoris: Secondary | ICD-10-CM

## 2017-05-15 LAB — GLUCOSE, CAPILLARY
Glucose-Capillary: 213 mg/dL — ABNORMAL HIGH (ref 65–99)
Glucose-Capillary: 196 mg/dL — ABNORMAL HIGH (ref 65–99)

## 2017-05-15 NOTE — Progress Notes (Signed)
Daily Session Note  Patient Details  Name: Brett Blake MRN: [id number] Date of Birth: 11/02/1941 Referring Provider:     Cardiac Rehab from 05/08/2017 in [facility] Cardiac and Pulmonary Rehab  Referring Provider  Emily Filbert MD      Encounter Date: 05/15/2017  Check In:     Session Check In - 05/15/17 1633      Check-In   Location [facility]-Cardiac & Pulmonary Rehab   Staff Present Nyoka Cowden, RN, BSN, MA;Susanne Bice, RN, BSN, Laveda Norman, BS, ACSM CEP, Exercise Physiologist;Meredith Sherryll Burger, RN BSN   Supervising physician immediately available to respond to emergencies See telemetry face sheet for immediately available ER MD   Medication changes reported     No   Fall or balance concerns reported    Yes   Comments stumbled and fell over a flower pot in the middle of the night. Reports he is very groggy when he first wakes and did not get hurt.    Warm-up and Cool-down Performed on first and last piece of equipment   Resistance Training Performed Yes   VAD Patient? No     Pain Assessment   Currently in Pain? No/denies         History  Smoking Status  . Former Smoker  . Packs/day: 1.00  . Years: 15.00  . Types: Cigarettes  Smokeless Tobacco  . Never Used    Comment: quit 1977    Goals Met:  Independence with exercise equipment Exercise tolerated well No report of cardiac concerns or symptoms Strength training completed today  Goals Unmet:  Not Applicable  Comments: Pt able to follow exercise prescription today without complaint.  Will continue to monitor for progression.   Dr. Emily Filbert is Medical Director for [facility].

## 2017-05-17 ENCOUNTER — Encounter: Payer: Self-pay | Admitting: *Deleted

## 2017-05-17 ENCOUNTER — Encounter: Payer: Medicare Other | Admitting: *Deleted

## 2017-05-17 DIAGNOSIS — I208 Other forms of angina pectoris: Secondary | ICD-10-CM

## 2017-05-17 NOTE — Progress Notes (Signed)
Cardiac Individual Treatment Plan  Patient Details  Name: Brett Blake MRN: [id number] Date of Birth: 01/14/1941 Referring Provider:     Cardiac Rehab from 05/08/2017 in [facility] Cardiac and Pulmonary Rehab  Referring Provider  Emily Filbert MD      Initial Encounter Date:    Cardiac Rehab from 05/08/2017 in [facility] Cardiac and Pulmonary Rehab  Date  05/08/17  Referring Provider  Emily Filbert MD      Visit Diagnosis: Stable angina [facility])  Patient's Home Medications on Admission:  Current Outpatient Prescriptions:  .  ALPRAZolam (XANAX) 0.5 MG tablet, Take 1 tablet (0.5 mg total) by mouth 3 (three) times daily as needed for sleep or anxiety., Disp: 15 tablet, Rfl: 0 .  amLODipine (NORVASC) 5 MG tablet, Take 5 mg by mouth daily., Disp: , Rfl: 3 .  aspirin 81 MG chewable tablet, Chew 81 mg by mouth daily., Disp: , Rfl:  .  azithromycin (ZITHROMAX) 250 MG tablet, Take 1 tablet (250 mg total) by mouth daily. (Patient not taking: Reported on 05/08/2017), Disp: 4 each, Rfl: 0 .  bisoprolol (ZEBETA) 5 MG tablet, Take 5 mg by mouth 2 (two) times daily., Disp: , Rfl:  .  esomeprazole (NEXIUM) 20 MG capsule, Take 20 mg by mouth daily., Disp: , Rfl:  .  fexofenadine (ALLEGRA) 180 MG tablet, Take 180 mg by mouth daily as needed for allergies or rhinitis., Disp: , Rfl:  .  fluticasone (FLONASE) 50 MCG/ACT nasal spray, Place 1 spray into both nostrils daily as needed for allergies or rhinitis., Disp: , Rfl:  .  glimepiride (AMARYL) 1 MG tablet, Take 1 mg by mouth daily. , Disp: , Rfl:  .  glucosamine-chondroitin 500-400 MG tablet, Take 1 tablet by mouth daily., Disp: , Rfl:  .  ibuprofen (ADVIL,MOTRIN) 200 MG tablet, Take 200 mg by mouth every 6 (six) hours as needed., Disp: , Rfl:  .  metoprolol succinate (TOPROL-XL) 25 MG 24 hr tablet, Take 25 mg by mouth daily., Disp: , Rfl:  .  montelukast (SINGULAIR) 10 MG tablet, Take 10 mg by mouth at bedtime., Disp: , Rfl:  .  Multiple Vitamins-Minerals  (MULTIVITAMIN PO), Take 1 tablet by mouth daily., Disp: , Rfl:  .  nitroGLYCERIN (NITROSTAT) 0.4 MG SL tablet, Place 0.4 mg under the tongue every 5 (five) minutes as needed for chest pain., Disp: , Rfl:  .  ranitidine (ZANTAC) 150 MG tablet, Take 150 mg by mouth at bedtime as needed for heartburn., Disp: , Rfl:  .  simvastatin (ZOCOR) 20 MG tablet, Take 20 mg by mouth daily., Disp: , Rfl:  .  triamterene-hydrochlorothiazide (DYAZIDE) 37.5-25 MG per capsule, Take 1 capsule by mouth daily., Disp: , Rfl:   Past Medical History: Past Medical History:  Diagnosis Date  . Anxiety   . Arthritis   . Asthma   . Colon polyps   . COPD (chronic obstructive pulmonary disease) ([place])   . Depression   . Diabetes ([place])   . GERD (gastroesophageal reflux disease)   . High cholesterol   . Hyperlipidemia   . Hypertension   . Sleep apnea     Tobacco Use: History  Smoking Status  . Former Smoker  . Packs/day: 1.00  . Years: 15.00  . Types: Cigarettes  Smokeless Tobacco  . Never Used    Comment: quit 1977    Labs: Recent Review Flowsheet Data    Labs for ITP Cardiac and Pulmonary Rehab Latest Ref Rng & Units 06/26/2015 06/27/2015  Hemoglobin A1c 4.0 - 6.0 % 5.9 6.1(H)       Exercise Target Goals:    Exercise Program Goal: Individual exercise prescription set with THRR, safety & activity barriers. Participant demonstrates ability to understand and report RPE using BORG scale, to self-measure pulse accurately, and to acknowledge the importance of the exercise prescription.  Exercise Prescription Goal: Starting with aerobic activity 30 plus minutes a day, 3 days per week for initial exercise prescription. Provide home exercise prescription and guidelines that participant acknowledges understanding prior to discharge.  Activity Barriers & Risk Stratification:     Activity Barriers & Cardiac Risk Stratification - 05/08/17 1245      Activity Barriers & Cardiac Risk Stratification   Activity  Barriers Shortness of Breath;Chest Pain/Angina;Joint Problems;Deconditioning;Muscular Weakness;Balance Concerns  Possibly combination of COPD and angina, R shoulder pain    Cardiac Risk Stratification High      6 Minute Walk:     6 Minute Walk    Row Name 05/08/17 1334         6 Minute Walk   Phase Initial     Distance 1600 feet     Walk Time 6 minutes     # of Rest Breaks 0     MPH 3.03     METS 2.97     RPE 9     Perceived Dyspnea  1     VO2 Peak 10.4     Symptoms Yes (comment)     Comments SOB     Resting HR 74 bpm     Resting BP 132/66     Max Ex. HR 107 bpm     Max Ex. BP 132/64     2 Minute Post BP 126/60        Oxygen Initial Assessment:   Oxygen Re-Evaluation:   Oxygen Discharge (Final Oxygen Re-Evaluation):   Initial Exercise Prescription:     Initial Exercise Prescription - 05/08/17 1300      Date of Initial Exercise RX and Referring Provider   Date 05/08/17   Referring Provider Bethann Punches MD     Treadmill   MPH 2.5   Grade 0.5   Minutes 15   METs 3.09     Recumbant Bike   Level 2   RPM 25   Watts 28   Minutes 15   METs 2.87     NuStep   Level 2   SPM 80   Minutes 15   METs 2     Prescription Details   Frequency (times per week) 3   Duration Progress to 45 minutes of aerobic exercise without signs/symptoms of physical distress     Intensity   THRR 40-80% of Max Heartrate 109-134   Ratings of Perceived Exertion 11-13   Perceived Dyspnea 0-4     Progression   Progression Continue to progress workloads to maintain intensity without signs/symptoms of physical distress.     Resistance Training   Training Prescription Yes   Weight 3 lbs   Reps 10-15      Perform Capillary Blood Glucose checks as needed.  Exercise Prescription Changes:     Exercise Prescription Changes    Row Name 05/08/17 1200             Response to Exercise   Blood Pressure (Admit) 132/66       Blood Pressure (Exercise) 132/64       Blood  Pressure (Exit) 126/60       Heart Rate (Admit)  85 bpm       Heart Rate (Exercise) 107 bpm       Heart Rate (Exit) 92 bpm       Oxygen Saturation (Admit) 97 %       Oxygen Saturation (Exercise) 98 %       Rating of Perceived Exertion (Exercise) 9       Symptoms SOB       Comments walk test results          Exercise Comments:   Exercise Goals and Review:     Exercise Goals    Row Name 05/08/17 1338             Exercise Goals   Increase Physical Activity Yes       Intervention Provide advice, education, support and counseling about physical activity/exercise needs.;Develop an individualized exercise prescription for aerobic and resistive training based on initial evaluation findings, risk stratification, comorbidities and participant's personal goals.       Expected Outcomes Achievement of increased cardiorespiratory fitness and enhanced flexibility, muscular endurance and strength shown through measurements of functional capacity and personal statement of participant.       Increase Strength and Stamina Yes       Intervention Provide advice, education, support and counseling about physical activity/exercise needs.;Develop an individualized exercise prescription for aerobic and resistive training based on initial evaluation findings, risk stratification, comorbidities and participant's personal goals.       Expected Outcomes Achievement of increased cardiorespiratory fitness and enhanced flexibility, muscular endurance and strength shown through measurements of functional capacity and personal statement of participant.          Exercise Goals Re-Evaluation :   Discharge Exercise Prescription (Final Exercise Prescription Changes):     Exercise Prescription Changes - 05/08/17 1200      Response to Exercise   Blood Pressure (Admit) 132/66   Blood Pressure (Exercise) 132/64   Blood Pressure (Exit) 126/60   Heart Rate (Admit) 85 bpm   Heart Rate (Exercise) 107 bpm   Heart  Rate (Exit) 92 bpm   Oxygen Saturation (Admit) 97 %   Oxygen Saturation (Exercise) 98 %   Rating of Perceived Exertion (Exercise) 9   Symptoms SOB   Comments walk test results      Nutrition:  Target Goals: Understanding of nutrition guidelines, daily intake of sodium '1500mg'$ , cholesterol '200mg'$ , calories 30% from fat and 7% or less from saturated fats, daily to have 5 or more servings of fruits and vegetables.  Biometrics:     Pre Biometrics - 05/08/17 1338      Pre Biometrics   Height 5' 5.2" (1.656 m)   Weight 196 lb 8 oz (89.1 kg)   Waist Circumference 43.5 inches   Hip Circumference 40.5 inches   Waist to Hip Ratio 1.07 %   BMI (Calculated) 32.6   Single Leg Stand 12.17 seconds       Nutrition Therapy Plan and Nutrition Goals:     Nutrition Therapy & Goals - 05/08/17 1236      Intervention Plan   Intervention Prescribe, educate and counsel regarding individualized specific dietary modifications aiming towards targeted core components such as weight, hypertension, lipid management, diabetes, heart failure and other comorbidities.   Expected Outcomes Short Term Goal: Understand basic principles of dietary content, such as calories, fat, sodium, cholesterol and nutrients.;Short Term Goal: A plan has been developed with personal nutrition goals set during dietitian appointment.;Long Term Goal: Adherence to prescribed nutrition plan.  Nutrition Discharge: Rate Your Plate Scores:     Nutrition Assessments - 05/08/17 1237      MEDFICTS Scores   Pre Score 36      Nutrition Goals Re-Evaluation:   Nutrition Goals Discharge (Final Nutrition Goals Re-Evaluation):   Psychosocial: Target Goals: Acknowledge presence or absence of significant depression and/or stress, maximize coping skills, provide positive support system. Participant is able to verbalize types and ability to use techniques and skills needed for reducing stress and depression.   Initial Review &  Psychosocial Screening:     Initial Psych Review & Screening - 05/08/17 1240      Initial Review   Current issues with Current Anxiety/Panic;Current Sleep Concerns  Anxiety when he is short of breath     Family Dynamics   Good Support System? Yes  Renato Gails, wife     Barriers   Psychosocial barriers to participate in program There are no identifiable barriers or psychosocial needs.     Screening Interventions   Interventions Encouraged to exercise;Provide feedback about the scores to participant;Program counselor consult      Quality of Life Scores:      Quality of Life - 05/08/17 1242      Quality of Life Scores   Health/Function Pre 14.7 %   Socioeconomic Pre 23.25 %   Psych/Spiritual Pre 27.93 %   Family Pre 18 %   GLOBAL Pre 19.58 %      PHQ-9: Recent Review Flowsheet Data    Depression screen [facility] 2/9 05/08/2017   Decreased Interest 1   Down, Depressed, Hopeless 0   PHQ - 2 Score 1   Altered sleeping 2    Tired, decreased energy 2   Change in appetite 3    Feeling bad or failure about yourself  0   Trouble concentrating 1   Moving slowly or fidgety/restless 0   Suicidal thoughts 0   PHQ-9 Score 9   Difficult doing work/chores Somewhat difficult      Interpretation of Total Score  Total Score Depression Severity:  1-4 = Minimal depression, 5-9 = Mild depression, 10-14 = Moderate depression, 15-19 = Moderately severe depression, 20-27 = Severe depression   Psychosocial Evaluation and Intervention:     Psychosocial Evaluation - 05/15/17 1735      Psychosocial Evaluation & Interventions   Interventions Encouraged to exercise with the program and follow exercise prescription;Stress management education;Relaxation education   Comments Counselor met with Mr. Kreuzer Onalee Hua) today for initial psychosocial evaluation.  He is a 76 year old who struggles with angina and was recommended to this program by his Cardiologist.  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  Onalee Hua has multiple health issues with diabetes, asthma, COPD, Sleep Apnea in addition to his heart diagnosis.  He reports sleeping well lately but not well mostly which has been a chronic problem - even with the use occasionally of a CPAP.  His appetite has decreased significantly lately and he attributes that to possibly one of his medications.  He has a history of depression and some current symptoms of anxiety for which he has medication that helps.  He reports his mood is typically positive.  Onalee Hua has multiple stressors in his life with his health, his wife's health with back problems, and he is continuing to remodel his home - which he reports is a "2  year project!".  Shanon Brow has goals to lose more weight (he has lost 14 pounds this year so far) and increase his stamina and strength.  Staff will follow with Shanon Brow throughout the course of this program.     Expected Outcomes Shanon Brow will benefit from consistent exercise to achieve his stated goals.  He will also benefit from the psychoeducational components of this program to help with his anxiety symptoms and learn positive ways to cope with stress.  Staff will follow with Shanon Brow.      Psychosocial Re-Evaluation:   Psychosocial Discharge (Final Psychosocial Re-Evaluation):   Vocational Rehabilitation: Provide vocational rehab assistance to qualifying candidates.   Vocational Rehab Evaluation & Intervention:     Vocational Rehab - 05/08/17 1232      Initial Vocational Rehab Evaluation & Intervention   Assessment shows need for Vocational Rehabilitation No      Education: Education Goals: Education classes will be provided on a weekly basis, covering required topics. Participant will state understanding/return demonstration of topics presented.  Learning Barriers/Preferences:     Learning  Barriers/Preferences - 05/08/17 1231      Learning Barriers/Preferences   Learning Barriers Hearing   Learning Preferences Individual Instruction      Education Topics: General Nutrition Guidelines/Fats and Fiber: -Group instruction provided by verbal, written material, models and posters to present the general guidelines for heart healthy nutrition. Gives an explanation and review of dietary fats and fiber.   Controlling Sodium/Reading Food Labels: -Group verbal and written material supporting the discussion of sodium use in heart healthy nutrition. Review and explanation with models, verbal and written materials for utilization of the food label.   Exercise Physiology & Risk Factors: - Group verbal and written instruction with models to review the exercise physiology of the cardiovascular system and associated critical values. Details cardiovascular disease risk factors and the goals associated with each risk factor.   Aerobic Exercise & Resistance Training: - Gives group verbal and written discussion on the health impact of inactivity. On the components of aerobic and resistive training programs and the benefits of this training and how to safely progress through these programs.   Cardiac Rehab from 05/15/2017 in [facility] Cardiac and Pulmonary Rehab  Date  05/15/17  Educator  [facility]  Instruction Review Code  2- meets goals/outcomes      Flexibility, Balance, General Exercise Guidelines: - Provides group verbal and written instruction on the benefits of flexibility and balance training programs. Provides general exercise guidelines with specific guidelines to those with heart or lung disease. Demonstration and skill practice provided.   Stress Management: - Provides group verbal and written instruction about the health risks of elevated stress, cause of high stress, and healthy ways to reduce stress.   Depression: - Provides group verbal and written instruction on the correlation  between heart/lung disease and depressed mood, treatment options, and the stigmas associated with seeking treatment.   Anatomy & Physiology of the Heart: - Group verbal and written instruction and models provide basic cardiac anatomy and physiology, with the coronary electrical and arterial systems. Review of: AMI, Angina, Valve disease, Heart Failure, Cardiac Arrhythmia, Pacemakers, and the ICD.   Cardiac Procedures: - Group verbal and written instruction and models to describe the testing methods done to diagnose heart disease. Reviews the outcomes of the test results. Describes the treatment choices: Medical Management, Angioplasty, or Coronary Bypass Surgery.   Cardiac Medications: - Group verbal and written instruction to review commonly prescribed medications for heart disease. Reviews the  medication, class of the drug, and side effects. Includes the steps to properly store meds and maintain the prescription regimen.   Go Sex-Intimacy & Heart Disease, Get SMART - Goal Setting: - Group verbal and written instruction through game format to discuss heart disease and the return to sexual intimacy. Provides group verbal and written material to discuss and apply goal setting through the application of the S.M.A.R.T. Method.   Other Matters of the Heart: - Provides group verbal, written materials and models to describe Heart Failure, Angina, Valve Disease, and Diabetes in the realm of heart disease. Includes description of the disease process and treatment options available to the cardiac patient.   Exercise & Equipment Safety: - Individual verbal instruction and demonstration of equipment use and safety with use of the equipment.   Cardiac Rehab from 05/15/2017 in [facility] Cardiac and Pulmonary Rehab  Date  05/08/17  Educator  SB  Instruction Review Code  2- meets goals/outcomes      Infection Prevention: - Provides verbal and written material to individual with discussion of infection  control including proper hand washing and proper equipment cleaning during exercise session.   Cardiac Rehab from 05/15/2017 in [facility] Cardiac and Pulmonary Rehab  Date  05/08/17  Educator  SB  Instruction Review Code  2- meets goals/outcomes      Falls Prevention: - Provides verbal and written material to individual with discussion of falls prevention and safety.   Cardiac Rehab from 05/15/2017 in [facility] Cardiac and Pulmonary Rehab  Date  05/08/17  Educator  SB  Instruction Review Code  2- meets goals/outcomes      Diabetes: - Individual verbal and written instruction to review signs/symptoms of diabetes, desired ranges of glucose level fasting, after meals and with exercise. Advice that pre and post exercise glucose checks will be done for 3 sessions at entry of program.   Cardiac Rehab from 05/15/2017 in [facility] Cardiac and Pulmonary Rehab  Date  05/08/17  Educator  [facility]  Instruction Review Code  2- meets goals/outcomes       Knowledge Questionnaire Score:     Knowledge Questionnaire Score - 05/08/17 1231      Knowledge Questionnaire Score   Pre Score 24/28  Reviewed correct answers with Shanon Brow, he acknowledged understanding      Core Components/Risk Factors/Patient Goals at Admission:     Personal Goals and Risk Factors at Admission - 05/08/17 1237      Core Components/Risk Factors/Patient Goals on Admission    Weight Management Yes;Obesity;Weight Loss   Intervention Weight Management/Obesity: Establish reasonable short term and long term weight goals.;Obesity: Provide education and appropriate resources to help participant work on and attain dietary goals.   Admit Weight 196 lb 8 oz (89.1 kg)   Goal Weight: Short Term 193 lb (87.5 kg)   Goal Weight: Long Term 170 lb (77.1 kg)   Expected Outcomes Short Term: Continue to assess and modify interventions until short term weight is achieved;Long Term: Adherence to nutrition and physical activity/exercise program aimed toward  attainment of established weight goal;Weight Maintenance: Understanding of the daily nutrition guidelines, which includes 25-35% calories from fat, 7% or less cal from saturated fats, less than '200mg'$  cholesterol, less than 1.5gm of sodium, & 5 or more servings of fruits and vegetables daily;Weight Loss: Understanding of general recommendations for a balanced deficit meal plan, which promotes 1-2 lb weight loss per week and includes a negative energy balance of [id number] kcal/d  Weighed 213 lb in December 2017  Diabetes Yes   Intervention Provide education about signs/symptoms and action to take for hypo/hyperglycemia.;Provide education about proper nutrition, including hydration, and aerobic/resistive exercise prescription along with prescribed medications to achieve blood glucose in normal ranges: Fasting glucose 65-99 mg/dL   Expected Outcomes Short Term: Participant verbalizes understanding of the signs/symptoms and immediate care of hyper/hypoglycemia, proper foot care and importance of medication, aerobic/resistive exercise and nutrition plan for blood glucose control.;Long Term: Attainment of HbA1C < 7%.   Hypertension Yes   Intervention Provide education on lifestyle modifcations including regular physical activity/exercise, weight management, moderate sodium restriction and increased consumption of fresh fruit, vegetables, and low fat dairy, alcohol moderation, and smoking cessation.;Monitor prescription use compliance.   Expected Outcomes Short Term: Continued assessment and intervention until BP is < 140/51m HG in hypertensive participants. < 130/858mHG in hypertensive participants with diabetes, heart failure or chronic kidney disease.;Long Term: Maintenance of blood pressure at goal levels.   Lipids Yes   Intervention Provide education and support for participant on nutrition & aerobic/resistive exercise along with prescribed medications to achieve LDL '70mg'$ , HDL >'40mg'$ .   Expected Outcomes  Short Term: Participant states understanding of desired cholesterol values and is compliant with medications prescribed. Participant is following exercise prescription and nutrition guidelines.;Long Term: Cholesterol controlled with medications as prescribed, with individualized exercise RX and with personalized nutrition plan. Value goals: LDL < '70mg'$ , HDL > 40 mg.      Core Components/Risk Factors/Patient Goals Review:    Core Components/Risk Factors/Patient Goals at Discharge (Final Review):    ITP Comments:     ITP Comments    Row Name 05/08/17 1235 05/17/17 0927         ITP Comments Medical review completed. Initial ITP created. Documentation of diagnosis can be found in Care Everywhere 04/26/17 Office note.  30 day review. Continue with ITP unless directed changes per Medical Director review  New to program         Comments:

## 2017-05-17 NOTE — Progress Notes (Signed)
Daily Session Note  Patient Details  Name: Brett Blake MRN: [id number] Date of Birth: Sep 11, 1941 Referring Provider:     Cardiac Rehab from 05/08/2017 in [facility] Cardiac and Pulmonary Rehab  Referring Provider  Emily Filbert MD      Encounter Date: 05/17/2017  Check In:     Session Check In - 05/17/17 1723      Check-In   Location [facility]-Cardiac & Pulmonary Rehab   Staff Present Renita Papa, RN BSN;Carroll Enterkin, RN, Vickki Hearing, BA, ACSM CEP, Exercise Physiologist   Supervising physician immediately available to respond to emergencies See telemetry face sheet for immediately available ER MD   Medication changes reported     No   Fall or balance concerns reported    No   Warm-up and Cool-down Performed on first and last piece of equipment   Resistance Training Performed Yes   VAD Patient? No     Pain Assessment   Currently in Pain? No/denies         History  Smoking Status  . Former Smoker  . Packs/day: 1.00  . Years: 15.00  . Types: Cigarettes  Smokeless Tobacco  . Never Used    Comment: quit 1977    Goals Met:  Independence with exercise equipment Exercise tolerated well No report of cardiac concerns or symptoms Strength training completed today  Goals Unmet:  Not Applicable  Comments: Pt able to follow exercise prescription today without complaint.  Will continue to monitor for progression.    Dr. Emily Filbert is Medical Director for [facility].

## 2017-05-18 DIAGNOSIS — I208 Other forms of angina pectoris: Secondary | ICD-10-CM | POA: Diagnosis not present

## 2017-05-18 LAB — GLUCOSE, CAPILLARY
GLUCOSE-CAPILLARY: 139 mg/dL — AB (ref 65–99)
GLUCOSE-CAPILLARY: 111 mg/dL — AB (ref 65–99)
GLUCOSE-CAPILLARY: 135 mg/dL — AB (ref 65–99)
Glucose-Capillary: 92 mg/dL (ref 65–99)

## 2017-05-24 ENCOUNTER — Telehealth: Payer: Self-pay

## 2017-05-24 NOTE — Telephone Encounter (Signed)
Brett Blake was advised by [facility] staff to call his Dr due to shortness of breath last Thursday.  He called Friday but didn't receive a return call.  He felt worse over the weekend so went to a walk in clinic Monday morning and was prescribed amoxicillin.  He is still not feeling well and has left a message with Dr. [name].  He plans to return to class Monday.

## 2017-05-25 NOTE — Progress Notes (Signed)
Daily Session Note  Patient Details  Name: Brett Blake MRN: [id number] Date of Birth: 12-14-1941 Referring Provider:     Cardiac Rehab from 05/08/2017 in [facility] Cardiac and Pulmonary Rehab  Referring Provider  Emily Filbert MD      Encounter Date: 05/18/2017  Check In:     Session Check In - 05/25/17 1117      Check-In   Location [facility]-Cardiac & Pulmonary Rehab   Staff Present Nada Maclachlan, BA, ACSM CEP, Exercise Physiologist;Kelly Amedeo Plenty, BS, ACSM CEP, Exercise Physiologist;Carroll Enterkin, RN, BSN   Supervising physician immediately available to respond to emergencies See telemetry face sheet for immediately available ER MD   Medication changes reported     No   Fall or balance concerns reported    No   Resistance Training Performed Yes   VAD Patient? No     Pain Assessment   Currently in Pain? No/denies           Exercise Prescription Changes - 05/25/17 1100      Response to Exercise   Blood Pressure (Exercise) 146/68   Blood Pressure (Exit) 130/80   Heart Rate (Admit) 109 bpm   Heart Rate (Exercise) 126 bpm   Heart Rate (Exit) 97 bpm   Rating of Perceived Exertion (Exercise) 15   Duration Continue with 45 min of aerobic exercise without signs/symptoms of physical distress.   Intensity THRR unchanged     Resistance Training   Training Prescription Yes   Weight 3   Reps 10-15     Treadmill   MPH 2.5   Grade 0.5   Minutes 15   METs 3.26     Recumbant Bike   Level 2   Watts 28   Minutes 15   METs 2.97     NuStep   Level 2   SPM 80   Minutes 15   METs 3.3      History  Smoking Status  . Former Smoker  . Packs/day: 1.00  . Years: 15.00  . Types: Cigarettes  Smokeless Tobacco  . Never Used    Comment: quit 1977    Goals Met:  Independence with exercise equipment Exercise tolerated well Strength training completed today  Goals Unmet:  Not Applicable  Comments: Late chart entry - did not get entered on date of attendance.   Dr.  Emily Filbert is Medical Director for [place] and [facility].

## 2017-05-29 ENCOUNTER — Encounter: Payer: Medicare Other | Attending: Internal Medicine | Admitting: *Deleted

## 2017-05-29 DIAGNOSIS — I208 Other forms of angina pectoris: Secondary | ICD-10-CM | POA: Insufficient documentation

## 2017-05-29 DIAGNOSIS — I2089 Other forms of angina pectoris: Secondary | ICD-10-CM

## 2017-05-29 NOTE — Progress Notes (Signed)
Daily Session Note  Patient Details  Name: ALHAJI MCNEAL MRN: [id number] Date of Birth: Dec 24, 1941 Referring Provider:     Cardiac Rehab from 05/08/2017 in [facility] Cardiac and Pulmonary Rehab  Referring Provider  Emily Filbert MD      Encounter Date: 05/29/2017  Check In:     Session Check In - 05/29/17 1633      Check-In   Location [facility]-Cardiac & Pulmonary Rehab   Staff Present Darel Hong, RN Moises Blood, BS, ACSM CEP, Exercise Physiologist;Skya Mccullum, RN, BSN, CCRP   Supervising physician immediately available to respond to emergencies See telemetry face sheet for immediately available ER MD   Medication changes reported     Yes   Comments Symbicort inhaler as needed / antibiotics temporarty   Fall or balance concerns reported    No   Warm-up and Cool-down Performed on first and last piece of equipment   Resistance Training Performed Yes   VAD Patient? No     Pain Assessment   Currently in Pain? No/denies   Multiple Pain Sites No         History  Smoking Status  . Former Smoker  . Packs/day: 1.00  . Years: 15.00  . Types: Cigarettes  Smokeless Tobacco  . Never Used    Comment: quit 1977    Goals Met:  Exercise tolerated well No report of cardiac concerns or symptoms Strength training completed today  Goals Unmet:  Not Applicable  Comments: Doing well with exercise prescription progression.    Dr. Emily Filbert is Medical Director for [place] and [facility].

## 2017-05-31 ENCOUNTER — Encounter: Payer: Medicare Other | Admitting: *Deleted

## 2017-05-31 DIAGNOSIS — I208 Other forms of angina pectoris: Secondary | ICD-10-CM | POA: Diagnosis not present

## 2017-05-31 DIAGNOSIS — I2089 Other forms of angina pectoris: Secondary | ICD-10-CM

## 2017-05-31 NOTE — Progress Notes (Signed)
Daily Session Note  Patient Details  Name: Brett Blake MRN: [id number] Date of Birth: Sep 22, 1941 Referring Provider:     Cardiac Rehab from 05/08/2017 in [facility] Cardiac and Pulmonary Rehab  Referring Provider  Emily Filbert MD      Encounter Date: 05/31/2017  Check In:     Session Check In - 05/31/17 1611      Check-In   Location [facility]iac & Pul[facility] Re[facility]   Staff Present Gerlene Burdock, RN, Vickki Hearing, BA, ACSM CEP, Exercise Physiologist;Krista Frederico Hamman, RN BSN   Supervising physician immediately available to respond to emergencies See telemetry face sheet for immediately available ER MD   Medication changes reported     No   Tobacco Cessation No Change   Warm-up and Cool-down Performed on first and last piece of equipment   Resistance Training Performed Yes   VAD Patient? No     Pain Assessment   Currently in Pain? No/denies         History  Smoking Status  . Former Smoker  . Packs/day: 1.00  . Years: 15.00  . Types: Cigarettes  Smokeless Tobacco  . Never Used    Comment: quit 1977    Goals Met:  Proper associated with RPD/PD & O2 Sat Exercise tolerated well No report of cardiac concerns or symptoms  Goals Unmet:  Not Applicable  Comments:     Dr. Emily Filbert is Medical Director for [place] and [facility].

## 2017-06-01 DIAGNOSIS — I208 Other forms of angina pectoris: Secondary | ICD-10-CM

## 2017-06-01 NOTE — Progress Notes (Signed)
Daily Session Note  Patient Details  Name: Brett Blake MRN: [id number] Date of Birth: 10/16/1941 Referring Provider:     Cardiac Rehab from 05/08/2017 in [facility] Cardiac and Pulmonary Rehab  Referring Provider  Emily Filbert MD      Encounter Date: 06/01/2017  Check In:     Session Check In - 06/01/17 1707      Check-In   Location [facility]-Cardiac & Pulmonary Rehab   Staff Present Heath Lark, RN, BSN, Laveda Norman, BS, ACSM CEP, Exercise Physiologist;Amanda Oletta Darter, [place], ACSM CEP, Exercise Physiologist   Supervising physician immediately available to respond to emergencies See telemetry face sheet for immediately available ER MD   Medication changes reported     No   Fall or balance concerns reported    No   Warm-up and Cool-down Performed on first and last piece of equipment   Resistance Training Performed Yes   VAD Patient? No     Pain Assessment   Currently in Pain? No/denies         History  Smoking Status  . Former Smoker  . Packs/day: 1.00  . Years: 15.00  . Types: Cigarettes  Smokeless Tobacco  . Never Used    Comment: quit 1977    Goals Met:  Independence with exercise equipment Exercise tolerated well No report of cardiac concerns or symptoms Strength training completed today  Goals Unmet:  Not Applicable  Comments: Pt able to follow exercise prescription today without complaint.  Will continue to monitor for progression.    Dr. Emily Filbert is Medical Director for [place] and [facility].

## 2017-06-05 ENCOUNTER — Encounter: Payer: Medicare Other | Admitting: *Deleted

## 2017-06-05 DIAGNOSIS — I208 Other forms of angina pectoris: Secondary | ICD-10-CM

## 2017-06-05 NOTE — Progress Notes (Signed)
Daily Session Note  Patient Details  Name: Brett Blake MRN: [id number] Date of Birth: 1941-12-07 Referring Provider:     Cardiac Rehab from 05/08/2017 in [facility] Cardiac and Pulmonary Rehab  Referring Provider  Emily Filbert MD      Encounter Date: 06/05/2017  Check In:     Session Check In - 06/05/17 1735      Check-In   Location [facility]-Cardiac & Pulmonary Rehab   Staff Present Earlean Shawl, BS, ACSM CEP, Exercise Physiologist;Meredith Sherryll Burger, RN BSN;Susanne Bice, RN, BSN, CCRP   Supervising physician immediately available to respond to emergencies See telemetry face sheet for immediately available ER MD   Medication changes reported     No   Fall or balance concerns reported    No   Warm-up and Cool-down Performed on first and last piece of equipment   Resistance Training Performed Yes   VAD Patient? No     Pain Assessment   Currently in Pain? No/denies   Multiple Pain Sites No         History  Smoking Status  . Former Smoker  . Packs/day: 1.00  . Years: 15.00  . Types: Cigarettes  Smokeless Tobacco  . Never Used    Comment: quit 1977    Goals Met:  Independence with exercise equipment Exercise tolerated well No report of cardiac concerns or symptoms Strength training completed today  Goals Unmet:  Not Applicable  Comments: Pt able to follow exercise prescription today without complaint.  Will continue to monitor for progression.    Dr. Emily Filbert is Medical Director for [place] and [facility].

## 2017-06-07 ENCOUNTER — Encounter: Payer: Medicare Other | Admitting: *Deleted

## 2017-06-07 DIAGNOSIS — I208 Other forms of angina pectoris: Secondary | ICD-10-CM

## 2017-06-07 NOTE — Progress Notes (Signed)
Cardiac Individual Treatment Plan  Patient Details  Name: Brett Blake MRN: [id number] Date of Birth: 01/14/1941 Referring Provider:     Cardiac Rehab from 05/08/2017 in [facility] Cardiac and Pulmonary Rehab  Referring Provider  Emily Filbert MD      Initial Encounter Date:    Cardiac Rehab from 05/08/2017 in [facility] Cardiac and Pulmonary Rehab  Date  05/08/17  Referring Provider  Emily Filbert MD      Visit Diagnosis: Stable angina [facility])  Patient's Home Medications on Admission:  Current Outpatient Prescriptions:  .  ALPRAZolam (XANAX) 0.5 MG tablet, Take 1 tablet (0.5 mg total) by mouth 3 (three) times daily as needed for sleep or anxiety., Disp: 15 tablet, Rfl: 0 .  amLODipine (NORVASC) 5 MG tablet, Take 5 mg by mouth daily., Disp: , Rfl: 3 .  aspirin 81 MG chewable tablet, Chew 81 mg by mouth daily., Disp: , Rfl:  .  azithromycin (ZITHROMAX) 250 MG tablet, Take 1 tablet (250 mg total) by mouth daily. (Patient not taking: Reported on 05/08/2017), Disp: 4 each, Rfl: 0 .  bisoprolol (ZEBETA) 5 MG tablet, Take 5 mg by mouth 2 (two) times daily., Disp: , Rfl:  .  esomeprazole (NEXIUM) 20 MG capsule, Take 20 mg by mouth daily., Disp: , Rfl:  .  fexofenadine (ALLEGRA) 180 MG tablet, Take 180 mg by mouth daily as needed for allergies or rhinitis., Disp: , Rfl:  .  fluticasone (FLONASE) 50 MCG/ACT nasal spray, Place 1 spray into both nostrils daily as needed for allergies or rhinitis., Disp: , Rfl:  .  glimepiride (AMARYL) 1 MG tablet, Take 1 mg by mouth daily. , Disp: , Rfl:  .  glucosamine-chondroitin 500-400 MG tablet, Take 1 tablet by mouth daily., Disp: , Rfl:  .  ibuprofen (ADVIL,MOTRIN) 200 MG tablet, Take 200 mg by mouth every 6 (six) hours as needed., Disp: , Rfl:  .  metoprolol succinate (TOPROL-XL) 25 MG 24 hr tablet, Take 25 mg by mouth daily., Disp: , Rfl:  .  montelukast (SINGULAIR) 10 MG tablet, Take 10 mg by mouth at bedtime., Disp: , Rfl:  .  Multiple Vitamins-Minerals  (MULTIVITAMIN PO), Take 1 tablet by mouth daily., Disp: , Rfl:  .  nitroGLYCERIN (NITROSTAT) 0.4 MG SL tablet, Place 0.4 mg under the tongue every 5 (five) minutes as needed for chest pain., Disp: , Rfl:  .  ranitidine (ZANTAC) 150 MG tablet, Take 150 mg by mouth at bedtime as needed for heartburn., Disp: , Rfl:  .  simvastatin (ZOCOR) 20 MG tablet, Take 20 mg by mouth daily., Disp: , Rfl:  .  triamterene-hydrochlorothiazide (DYAZIDE) 37.5-25 MG per capsule, Take 1 capsule by mouth daily., Disp: , Rfl:   Past Medical History: Past Medical History:  Diagnosis Date  . Anxiety   . Arthritis   . Asthma   . Colon polyps   . COPD (chronic obstructive pulmonary disease) ([place])   . Depression   . Diabetes ([place])   . GERD (gastroesophageal reflux disease)   . High cholesterol   . Hyperlipidemia   . Hypertension   . Sleep apnea     Tobacco Use: History  Smoking Status  . Former Smoker  . Packs/day: 1.00  . Years: 15.00  . Types: Cigarettes  Smokeless Tobacco  . Never Used    Comment: quit 1977    Labs: Recent Review Flowsheet Data    Labs for ITP Cardiac and Pulmonary Rehab Latest Ref Rng & Units 06/26/2015 06/27/2015  Hemoglobin A1c 4.0 - 6.0 % 5.9 6.1(H)       Exercise Target Goals:    Exercise Program Goal: Individual exercise prescription set with THRR, safety & activity barriers. Participant demonstrates ability to understand and report RPE using BORG scale, to self-measure pulse accurately, and to acknowledge the importance of the exercise prescription.  Exercise Prescription Goal: Starting with aerobic activity 30 plus minutes a day, 3 days per week for initial exercise prescription. Provide home exercise prescription and guidelines that participant acknowledges understanding prior to discharge.  Activity Barriers & Risk Stratification:     Activity Barriers & Cardiac Risk Stratification - 05/08/17 1245      Activity Barriers & Cardiac Risk Stratification   Activity  Barriers Shortness of Breath;Chest Pain/Angina;Joint Problems;Deconditioning;Muscular Weakness;Balance Concerns  Possibly combination of COPD and angina, R shoulder pain    Cardiac Risk Stratification High      6 Minute Walk:     6 Minute Walk    Row Name 05/08/17 1334         6 Minute Walk   Phase Initial     Distance 1600 feet     Walk Time 6 minutes     # of Rest Breaks 0     MPH 3.03     METS 2.97     RPE 9     Perceived Dyspnea  1     VO2 Peak 10.4     Symptoms Yes (comment)     Comments SOB     Resting HR 74 bpm     Resting BP 132/66     Max Ex. HR 107 bpm     Max Ex. BP 132/64     2 Minute Post BP 126/60        Oxygen Initial Assessment:   Oxygen Re-Evaluation:   Oxygen Discharge (Final Oxygen Re-Evaluation):   Initial Exercise Prescription:     Initial Exercise Prescription - 05/08/17 1300      Date of Initial Exercise RX and Referring Provider   Date 05/08/17   Referring Provider Emily Filbert MD     Treadmill   MPH 2.5   Grade 0.5   Minutes 15   METs 3.09     Recumbant Bike   Level 2   RPM 25   Watts 28   Minutes 15   METs 2.87     NuStep   Level 2   SPM 80   Minutes 15   METs 2     Prescription Details   Frequency (times per week) 3   Duration Progress to 45 minutes of aerobic exercise without signs/symptoms of physical distress     Intensity   THRR 40-80% of Max Heartrate 109-134   Ratings of Perceived Exertion 11-13   Perceived Dyspnea 0-4     Progression   Progression Continue to progress workloads to maintain intensity without signs/symptoms of physical distress.     Resistance Training   Training Prescription Yes   Weight 3 lbs   Reps 10-15      Perform Capillary Blood Glucose checks as needed.  Exercise Prescription Changes:     Exercise Prescription Changes    Row Name 05/08/17 1200 05/25/17 1100           Response to Exercise   Blood Pressure (Admit) 132/66  -      Blood Pressure (Exercise) 132/64  146/68      Blood Pressure (Exit) 126/60 130/80      Heart Rate (  Admit) 85 bpm 109 bpm      Heart Rate (Exercise) 107 bpm 126 bpm      Heart Rate (Exit) 92 bpm 97 bpm      Oxygen Saturation (Admit) 97 %  -      Oxygen Saturation (Exercise) 98 %  -      Rating of Perceived Exertion (Exercise) 9 15      Symptoms SOB  -      Comments walk test results  -      Duration  - Continue with 45 min of aerobic exercise without signs/symptoms of physical distress.      Intensity  - THRR unchanged        Resistance Training   Training Prescription  - Yes      Weight  - 3      Reps  - 10-15        Treadmill   MPH  - 2.5      Grade  - 0.5      Minutes  - 15      METs  - 3.26        Recumbant Bike   Level  - 2      Watts  - 28      Minutes  - 15      METs  - 2.97        NuStep   Level  - 2      SPM  - 80      Minutes  - 15      METs  - 3.3         Exercise Comments:     Exercise Comments    Row Name 05/25/17 1115           Exercise Comments Brett Blake has tolerated exercise well.  He has been out sick this week and is on antibiotics and pplans to return Monday.          Exercise Goals and Review:     Exercise Goals    Row Name 05/08/17 1338             Exercise Goals   Increase Physical Activity Yes       Intervention Provide advice, education, support and counseling about physical activity/exercise needs.;Develop an individualized exercise prescription for aerobic and resistive training based on initial evaluation findings, risk stratification, comorbidities and participant's personal goals.       Expected Outcomes Achievement of increased cardiorespiratory fitness and enhanced flexibility, muscular endurance and strength shown through measurements of functional capacity and personal statement of participant.       Increase Strength and Stamina Yes       Intervention Provide advice, education, support and counseling about physical activity/exercise needs.;Develop an  individualized exercise prescription for aerobic and resistive training based on initial evaluation findings, risk stratification, comorbidities and participant's personal goals.       Expected Outcomes Achievement of increased cardiorespiratory fitness and enhanced flexibility, muscular endurance and strength shown through measurements of functional capacity and personal statement of participant.          Exercise Goals Re-Evaluation :   Discharge Exercise Prescription (Final Exercise Prescription Changes):     Exercise Prescription Changes - 05/25/17 1100      Response to Exercise   Blood Pressure (Exercise) 146/68   Blood Pressure (Exit) 130/80   Heart Rate (Admit) 109 bpm   Heart Rate (Exercise) 126 bpm   Heart Rate (Exit) 97 bpm  Rating of Perceived Exertion (Exercise) 15   Duration Continue with 45 min of aerobic exercise without signs/symptoms of physical distress.   Intensity THRR unchanged     Resistance Training   Training Prescription Yes   Weight 3   Reps 10-15     Treadmill   MPH 2.5   Grade 0.5   Minutes 15   METs 3.26     Recumbant Bike   Level 2   Watts 28   Minutes 15   METs 2.97     NuStep   Level 2   SPM 80   Minutes 15   METs 3.3      Nutrition:  Target Goals: Understanding of nutrition guidelines, daily intake of sodium <159m, cholesterol <2075m calories 30% from fat and 7% or less from saturated fats, daily to have 5 or more servings of fruits and vegetables.  Biometrics:     Pre Biometrics - 05/08/17 1338      Pre Biometrics   Height 5' 5.2" (1.656 m)   Weight 196 lb 8 oz (89.1 kg)   Waist Circumference 43.5 inches   Hip Circumference 40.5 inches   Waist to Hip Ratio 1.07 %   BMI (Calculated) 32.6   Single Leg Stand 12.17 seconds       Nutrition Therapy Plan and Nutrition Goals:     Nutrition Therapy & Goals - 06/07/17 1705      Nutrition Therapy   RD appointment defered Yes      Nutrition Discharge: Rate Your  Plate Scores:     Nutrition Assessments - 05/08/17 1237      MEDFICTS Scores   Pre Score 36      Nutrition Goals Re-Evaluation:     Nutrition Goals Re-Evaluation    [place] 06/07/17 1705             Goals   Current Weight 202 lb (91.6 kg)       Nutrition Goal "I know what to eat. I have had those nutrition classes before"       Comment LuAricDaShanon Browdeclined my offer again today to see our Cardiac Rehab REgistered dieticain.        Expected Outcome Heart healthy eating          Nutrition Goals Discharge (Final Nutrition Goals Re-Evaluation):     Nutrition Goals Re-Evaluation - 06/07/17 1705      Goals   Current Weight 202 lb (91.6 kg)   Nutrition Goal "I know what to eat. I have had those nutrition classes before"   Comment LuNamishDaShanon Browdeclined my offer again today to see our Cardiac Rehab REgistered dieticain.    Expected Outcome Heart healthy eating      Psychosocial: Target Goals: Acknowledge presence or absence of significant depression and/or stress, maximize coping skills, provide positive support system. Participant is able to verbalize types and ability to use techniques and skills needed for reducing stress and depression.   Initial Review & Psychosocial Screening:     Initial Psych Review & Screening - 05/08/17 1240      Initial Review   Current issues with Current Anxiety/Panic;Current Sleep Concerns  Anxiety when he is short of breath     Family Dynamics   Good Support System? Yes  PaDoristine Bosworthwife     Barriers   Psychosocial barriers to participate in program There are no identifiable barriers or psychosocial needs.     Screening Interventions   Interventions Encouraged to exercise;Provide feedback about the  scores to participant;Program counselor consult      Quality of Life Scores:      Quality of Life - 05/08/17 1242      Quality of Life Scores   Health/Function Pre 14.7 %   Socioeconomic Pre 23.25 %   Psych/Spiritual Pre 27.93  %   Family Pre 18 %   GLOBAL Pre 19.58 %      PHQ-9: Recent Review Flowsheet Data    Depression screen [facility] 2/9 05/08/2017   Decreased Interest 1   Down, Depressed, Hopeless 0   PHQ - 2 Score 1   Altered sleeping 2    Tired, decreased energy 2   Change in appetite 3    Feeling bad or failure about yourself  0   Trouble concentrating 1   Moving slowly or fidgety/restless 0   Suicidal thoughts 0   PHQ-9 Score 9   Difficult doing work/chores Somewhat difficult      Interpretation of Total Score  Total Score Depression Severity:  1-4 = Minimal depression, 5-9 = Mild depression, 10-14 = Moderate depression, 15-19 = Moderately severe depression, 20-27 = Severe depression   Psychosocial Evaluation and Intervention:     Psychosocial Evaluation - 05/15/17 1735      Psychosocial Evaluation & Interventions   Interventions Encouraged to exercise with the program and follow exercise prescription;Stress management education;Relaxation education   Comments Counselor met with Mr. Kinzler Brett Blake) today for initial psychosocial evaluation.  He is a 76 year old who struggles with angina and was recommended to this program by his Cardiologist.  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  Brett Blake has multiple health issues with diabetes, asthma, COPD, Sleep Apnea in addition to his heart diagnosis.  He reports sleeping well lately but not well mostly which has been a chronic problem - even with the use occasionally of a CPAP.  His appetite has decreased significantly lately and he attributes that to possibly one of his medications.  He has a history of depression and some current symptoms of anxiety for which he has medication that helps.  He reports his mood is typically positive.  Brett Blake has multiple stressors in his life with his health, his wife's health with back problems, and  he is continuing to remodel his home - which he reports is a "2 year project!".  Brett Blake has goals to lose more weight (he has lost 14 pounds this year so far) and increase his stamina and strength.  Staff will follow with Brett Blake throughout the course of this program.     Expected Outcomes Brett Blake will benefit from consistent exercise to achieve his stated goals.  He will also benefit from the psychoeducational components of this program to help with his anxiety symptoms and learn positive ways to cope with stress.  Staff will follow with Brett Blake.      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 " I encounraged him when he went to exercise to get his exercise in and he said he could talk to someone while working out on the treadmilll like he did today.  Brett Blake told us how he took his wife out where they went on their first date years ago. 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 " I encounraged him when he went to exercise to get his exercise in and he said he could  talk to someone while working out on the treadmilll like he did today.  Brett Blake told us how he took his wife out where they went on their first date years ago. His wife is a good support system for him    Interventions Encouraged to attend Cardiac Rehabilitation for the exercise      Vocational Rehabilitation: Provide vocational rehab assistance to qualifying candidates.   Vocational Rehab Evaluation & Intervention:     Vocational Rehab - 05/08/17 1232      Initial Vocational Rehab Evaluation & Intervention   Assessment shows need for Vocational Rehabilitation No      Education: Education Goals: Education classes will be provided on a weekly basis, covering required topics. Participant will state understanding/return demonstration of topics presented.  Learning Barriers/Preferences:     Learning Barriers/Preferences - 05/08/17 1231      Learning Barriers/Preferences   Learning Barriers Hearing   Learning Preferences Individual Instruction      Education Topics: General Nutrition Guidelines/Fats and Fiber: -Group instruction provided by verbal, written material, models and posters to present the general guidelines for heart healthy nutrition. Gives an explanation and review of dietary fats and fiber.   Controlling Sodium/Reading Food Labels: -Group verbal and written material supporting the discussion of sodium use in heart healthy nutrition. Review and explanation with models, verbal and written materials for utilization of the food label.   Exercise Physiology & Risk Factors: - Group verbal and written instruction with models to review the exercise physiology of the cardiovascular system and associated critical values. Details cardiovascular disease risk factors and the goals associated with each risk factor.   Aerobic Exercise & Resistance Training: - Gives group verbal and written discussion on the health impact of inactivity. On the components of aerobic and resistive  training programs and the benefits of this training and how to safely progress through these programs.   Cardiac Rehab from 06/05/2017 in [facility] Cardiac and Pulmonary Rehab  Date  05/15/17  Educator  [facility]  Instruction Review Code  2- meets goals/outcomes      Flexibility, Balance, General Exercise Guidelines: - Provides group verbal and written instruction on the benefits of flexibility and balance training programs. Provides general exercise guidelines with specific guidelines to those with heart or lung disease. Demonstration and skill practice provided.   Stress Management: - Provides group verbal and written  instruction about the health risks of elevated stress, cause of high stress, and healthy ways to reduce stress.   Depression: - Provides group verbal and written instruction on the correlation between heart/lung disease and depressed mood, treatment options, and the stigmas associated with seeking treatment.   Anatomy & Physiology of the Heart: - Group verbal and written instruction and models provide basic cardiac anatomy and physiology, with the coronary electrical and arterial systems. Review of: AMI, Angina, Valve disease, Heart Failure, Cardiac Arrhythmia, Pacemakers, and the ICD.   Cardiac Procedures: - Group verbal and written instruction and models to describe the testing methods done to diagnose heart disease. Reviews the outcomes of the test results. Describes the treatment choices: Medical Management, Angioplasty, or Coronary Bypass Surgery.   Cardiac Rehab from 06/05/2017 in [facility] Cardiac and Pulmonary Rehab  Date  05/29/17  Educator  SB  Instruction Review Code  2- meets goals/outcomes      Cardiac Medications: - Group verbal and written instruction to review commonly prescribed medications for heart disease. Reviews the medication, class of the drug, and side effects. Includes the steps to properly store meds and maintain the prescription regimen.   Cardiac Rehab  from 06/05/2017 in [facility] Cardiac and Pulmonary Rehab  Date  06/05/17 Marisue Humble 2]  Educator  SB  Instruction Review Code  2- meets goals/outcomes      Go Sex-Intimacy & Heart Disease, Get SMART - Goal Setting: - Group verbal and written instruction through game format to discuss heart disease and the return to sexual intimacy. Provides group verbal and written material to discuss and apply goal setting through the application of the S.M.A.R.T. Method.   Cardiac Rehab from 06/05/2017 in [facility] Cardiac and Pulmonary Rehab  Date  05/29/17  Educator  SB  Instruction Review Code  2- meets goals/outcomes      Other Matters of the Heart: - Provides group verbal, written materials and models to describe Heart Failure, Angina, Valve Disease, and Diabetes in the realm of heart disease. Includes description of the disease process and treatment options available to the cardiac patient.   Exercise & Equipment Safety: - Individual verbal instruction and demonstration of equipment use and safety with use of the equipment.   Cardiac Rehab from 06/05/2017 in [facility] Cardiac and Pulmonary Rehab  Date  05/08/17  Educator  SB  Instruction Review Code  2- meets goals/outcomes      Infection Prevention: - Provides verbal and written material to individual with discussion of infection control including proper hand washing and proper equipment cleaning during exercise session.   Cardiac Rehab from 06/05/2017 in [facility] Cardiac and Pulmonary Rehab  Date  05/08/17  Educator  SB  Instruction Review Code  2- meets goals/outcomes      Falls Prevention: - Provides verbal and written material to individual with discussion of falls prevention and safety.   Cardiac Rehab from 06/05/2017 in [facility] Cardiac and Pulmonary Rehab  Date  05/08/17  Educator  SB  Instruction Review Code  2- meets goals/outcomes      Diabetes: - Individual verbal and written instruction to review signs/symptoms of diabetes, desired ranges of  glucose level fasting, after meals and with exercise. Advice that pre and post exercise glucose checks will be done for 3 sessions at entry of program.   Cardiac Rehab from 06/05/2017 in [facility] Cardiac and Pulmonary Rehab  Date  05/08/17  Educator  [facility]  Instruction Review Code  2- meets goals/outcomes       Knowledge  Questionnaire Score:     Knowledge Questionnaire Score - 05/08/17 1231      Knowledge Questionnaire Score   Pre Score 24/28  Reviewed correct answers with Brett Blake, he acknowledged understanding      Core Components/Risk Factors/Patient Goals at Admission:     Personal Goals and Risk Factors at Admission - 05/08/17 1237      Core Components/Risk Factors/Patient Goals on Admission    Weight Management Yes;Obesity;Weight Loss   Intervention Weight Management/Obesity: Establish reasonable short term and long term weight goals.;Obesity: Provide education and appropriate resources to help participant work on and attain dietary goals.   Admit Weight 196 lb 8 oz (89.1 kg)   Goal Weight: Short Term 193 lb (87.5 kg)   Goal Weight: Long Term 170 lb (77.1 kg)   Expected Outcomes Short Term: Continue to assess and modify interventions until short term weight is achieved;Long Term: Adherence to nutrition and physical activity/exercise program aimed toward attainment of established weight goal;Weight Maintenance: Understanding of the daily nutrition guidelines, which includes 25-35% calories from fat, 7% or less cal from saturated fats, less than 273m cholesterol, less than 1.5gm of sodium, & 5 or more servings of fruits and vegetables daily;Weight Loss: Understanding of general recommendations for a balanced deficit meal plan, which promotes 1-2 lb weight loss per week and includes a negative energy balance of [phone] kcal/d  Weighed 213 lb in December 2017    Diabetes Yes   Intervention Provide education about signs/symptoms and action to take for hypo/hyperglycemia.;Provide education  about proper nutrition, including hydration, and aerobic/resistive exercise prescription along with prescribed medications to achieve blood glucose in normal ranges: Fasting glucose 65-99 mg/dL   Expected Outcomes Short Term: Participant verbalizes understanding of the signs/symptoms and immediate care of hyper/hypoglycemia, proper foot care and importance of medication, aerobic/resistive exercise and nutrition plan for blood glucose control.;Long Term: Attainment of HbA1C < 7%.   Hypertension Yes   Intervention Provide education on lifestyle modifcations including regular physical activity/exercise, weight management, moderate sodium restriction and increased consumption of fresh fruit, vegetables, and low fat dairy, alcohol moderation, and smoking cessation.;Monitor prescription use compliance.   Expected Outcomes Short Term: Continued assessment and intervention until BP is < 140/937mHG in hypertensive participants. < 130/8049mG in hypertensive participants with diabetes, heart failure or chronic kidney disease.;Long Term: Maintenance of blood pressure at goal levels.   Lipids Yes   Intervention Provide education and support for participant on nutrition & aerobic/resistive exercise along with prescribed medications to achieve LDL <37m15mDL >40mg92mExpected Outcomes Short Term: Participant states understanding of desired cholesterol values and is compliant with medications prescribed. Participant is following exercise prescription and nutrition guidelines.;Long Term: Cholesterol controlled with medications as prescribed, with individualized exercise RX and with personalized nutrition plan. Value goals: LDL < 37mg,79m > 40 mg.      Core Components/Risk Factors/Patient Goals Review:      Goals and Risk Factor Review    Row Name 06/07/17 1707 06/07/17 1711           Core Components/Risk Factors/Patient Goals Review   Personal Goals Review Weight Management/Obesity  -      Review 202lbs  today "but i have been able to tighten my belt. My clothes feel better. I can come in here not feeling well but once I exercise I feel much better. I really believe this is helping me. I have Silver Sneakers so [place]  know I need to cont this exercise program.  Brett Blake reports that he used to have to check his blood sugars but his is on Glipiride and that keeps his blood sugars under control.       Expected Outcomes Heart healthy lifestyle. Try to lose more weight.          Core Components/Risk Factors/Patient Goals at Discharge (Final Review):      Goals and Risk Factor Review - 06/07/17 1711      Core Components/Risk Factors/Patient Goals Review   Review Brett Blake reports that he used to have to check his blood sugars but his is on Glipiride and that keeps his blood sugars under control.    Expected Outcomes Try to lose more weight.       ITP Comments:     ITP Comments    Row Name 05/08/17 1235 05/17/17 0927         ITP Comments Medical review completed. Initial ITP created. Documentation of diagnosis can be found in Care Everywhere 04/26/17 Office note.  30 day review. Continue with ITP unless directed changes per Medical Director review  New to program         Comments:

## 2017-06-07 NOTE — Progress Notes (Signed)
Daily Session Note  Patient Details  Name: NATALIE LECLAIRE MRN: [id number] Date of Birth: 10/04/1941 Referring Provider:     Cardiac Rehab from 05/08/2017 in [facility]  Referring Provider  Emily Filbert MD      Encounter Date: 06/07/2017  Check In:     Session Check In - 06/07/17 1704      Check-In   Location [facility]-Cardiac & Pulmonary Rehab   Staff Present Gerlene Burdock, RN, Vickki Hearing, BA, ACSM CEP, Exercise Physiologist;Meredith Sherryll Burger, RN BSN   Supervising physician immediately available to respond to emergencies See telemetry face sheet for immediately available ER MD   Medication changes reported     No   Warm-up and Cool-down Performed on first and last piece of equipment   Resistance Training Performed Yes     Pain Assessment   Currently in Pain? No/denies         History  Smoking Status  . Former Smoker  . Packs/day: 1.00  . Years: 15.00  . Types: Cigarettes  Smokeless Tobacco  . Never Used    Comment: quit 1977    Goals Met:  Proper associated with RPD/PD & O2 Sat Exercise tolerated well No report of cardiac concerns or symptoms Strength training completed today  Goals Unmet:  Not Applicable  Comments:     Dr. Emily Filbert is Medical Director for [place] and [facility].

## 2017-06-08 DIAGNOSIS — I208 Other forms of angina pectoris: Secondary | ICD-10-CM | POA: Diagnosis not present

## 2017-06-08 NOTE — Progress Notes (Signed)
Daily Session Note  Patient Details  Name: Brett Blake MRN: [id number] Date of Birth: 11-24-1941 Referring Provider:     Cardiac Rehab from 05/08/2017 in [facility] Card[facility] Re[facility]  Referring Provider  Emily Filbert MD      Encounter Date: 06/08/2017  Check In:     Session Check In - 06/08/17 1700      Check-In   Location [facility]iac & Pul[facility] Re[facility]   Staff Present Justin Mend Jaci Carrel, BS, ACSM CEP, Exercise Physiologist;Bartolo Montanye Oletta Darter, BA, ACSM CEP, Exercise Physiologist;Carroll Enterkin, RN, BSN   Supervising physician immediately available to respond to emergencies See telemetry face sheet for immediately available ER MD   Warm-up and Cool-down Performed on first and last piece of equipment   Resistance Training Performed Yes   VAD Patient? No     Pain Assessment   Currently in Pain? No/denies           Exercise Prescription Changes - 06/08/17 1300      Response to Exercise   Blood Pressure (Admit) 128/78   Blood Pressure (Exercise) 142/76   Blood Pressure (Exit) 114/63   Heart Rate (Admit) 109 bpm   Heart Rate (Exercise) 115 bpm   Heart Rate (Exit) 103 bpm   Rating of Perceived Exertion (Exercise) 13   Duration Continue with 45 min of aerobic exercise without signs/symptoms of physical distress.   Intensity THRR unchanged     Resistance Training   Training Prescription Yes   Weight 5   Reps 10-15     Treadmill   MPH 2.5   Grade 0.5   Minutes 15   METs 3.26     Recumbant Bike   Level 3   Watts 19   Minutes 15   METs 2.7      History  Smoking Status  . Former Smoker  . Packs/day: 1.00  . Years: 15.00  . Types: Cigarettes  Smokeless Tobacco  . Never Used    Comment: quit 1977    Goals Met:  Independence with exercise equipment Exercise tolerated well No report of cardiac concerns or symptoms Strength training completed today  Goals Unmet:  Not Applicable  Comments: Pt able to follow exercise prescription  today without complaint.  Will continue to monitor for progression.    Dr. Emily Filbert is Medical Director for [place] and [facility].

## 2017-06-12 ENCOUNTER — Encounter: Payer: Medicare Other | Admitting: *Deleted

## 2017-06-12 DIAGNOSIS — I208 Other forms of angina pectoris: Secondary | ICD-10-CM

## 2017-06-12 NOTE — Progress Notes (Signed)
Daily Session Note  Patient Details  Name: Brett Blake MRN: [id number] Date of Birth: 04/05/1941 Referring Provider:     Cardiac Rehab from 05/08/2017 in [facility] Card[facility] and Pul[facility] Rehab  Referring Provider  Emily Filbert MD      Encounter Date: 06/12/2017  Check In:     Session Check In - 06/12/17 1706      Check-In   Staff Present Heath Lark, RN, BSN, Laveda Norman, BS, ACSM CEP, Exercise Physiologist;Meredith Sherryll Burger, RN BSN   Supervising physician immediately available to respond to emergencies See telemetry face sheet for immediately available ER MD   Medication changes reported     Yes   Comments stopped monulast         History  Smoking Status  . Former Smoker  . Packs/day: 1.00  . Years: 15.00  . Types: Cigarettes  Smokeless Tobacco  . Never Used    Comment: quit 1977    Goals Met:  Exercise tolerated well Personal goals reviewed No report of cardiac concerns or symptoms Strength training completed today  Goals Unmet:  Not Applicable  Comments: HOme exercsie reviewed to day.   Doing well with exercise prescription progression.  Reviewed home exercise with pt today.  Pt plans to look into a community facility for exercise.  Reviewed THR, pulse, RPE, sign and symptoms, NTG use, and when to call 911 or MD.  Also discussed weather considerations and indoor options.  Pt voiced understanding.     Dr. Emily Filbert is Medical Director for [place] and [facility].

## 2017-06-14 ENCOUNTER — Encounter: Payer: Medicare Other | Admitting: *Deleted

## 2017-06-14 ENCOUNTER — Encounter: Payer: Self-pay | Admitting: *Deleted

## 2017-06-14 DIAGNOSIS — I208 Other forms of angina pectoris: Secondary | ICD-10-CM

## 2017-06-14 NOTE — Progress Notes (Signed)
Cardiac Individual Treatment Plan  Patient Details  Name: Brett Blake MRN: [id number] Date of Birth: 01/14/1941 Referring Provider:     Cardiac Rehab from 05/08/2017 in [facility] Cardiac and Pulmonary Rehab  Referring Provider  Emily Filbert MD      Initial Encounter Date:    Cardiac Rehab from 05/08/2017 in [facility] Cardiac and Pulmonary Rehab  Date  05/08/17  Referring Provider  Emily Filbert MD      Visit Diagnosis: Stable angina [facility])  Patient's Home Medications on Admission:  Current Outpatient Prescriptions:  .  ALPRAZolam (XANAX) 0.5 MG tablet, Take 1 tablet (0.5 mg total) by mouth 3 (three) times daily as needed for sleep or anxiety., Disp: 15 tablet, Rfl: 0 .  amLODipine (NORVASC) 5 MG tablet, Take 5 mg by mouth daily., Disp: , Rfl: 3 .  aspirin 81 MG chewable tablet, Chew 81 mg by mouth daily., Disp: , Rfl:  .  azithromycin (ZITHROMAX) 250 MG tablet, Take 1 tablet (250 mg total) by mouth daily. (Patient not taking: Reported on 05/08/2017), Disp: 4 each, Rfl: 0 .  bisoprolol (ZEBETA) 5 MG tablet, Take 5 mg by mouth 2 (two) times daily., Disp: , Rfl:  .  esomeprazole (NEXIUM) 20 MG capsule, Take 20 mg by mouth daily., Disp: , Rfl:  .  fexofenadine (ALLEGRA) 180 MG tablet, Take 180 mg by mouth daily as needed for allergies or rhinitis., Disp: , Rfl:  .  fluticasone (FLONASE) 50 MCG/ACT nasal spray, Place 1 spray into both nostrils daily as needed for allergies or rhinitis., Disp: , Rfl:  .  glimepiride (AMARYL) 1 MG tablet, Take 1 mg by mouth daily. , Disp: , Rfl:  .  glucosamine-chondroitin 500-400 MG tablet, Take 1 tablet by mouth daily., Disp: , Rfl:  .  ibuprofen (ADVIL,MOTRIN) 200 MG tablet, Take 200 mg by mouth every 6 (six) hours as needed., Disp: , Rfl:  .  metoprolol succinate (TOPROL-XL) 25 MG 24 hr tablet, Take 25 mg by mouth daily., Disp: , Rfl:  .  montelukast (SINGULAIR) 10 MG tablet, Take 10 mg by mouth at bedtime., Disp: , Rfl:  .  Multiple Vitamins-Minerals  (MULTIVITAMIN PO), Take 1 tablet by mouth daily., Disp: , Rfl:  .  nitroGLYCERIN (NITROSTAT) 0.4 MG SL tablet, Place 0.4 mg under the tongue every 5 (five) minutes as needed for chest pain., Disp: , Rfl:  .  ranitidine (ZANTAC) 150 MG tablet, Take 150 mg by mouth at bedtime as needed for heartburn., Disp: , Rfl:  .  simvastatin (ZOCOR) 20 MG tablet, Take 20 mg by mouth daily., Disp: , Rfl:  .  triamterene-hydrochlorothiazide (DYAZIDE) 37.5-25 MG per capsule, Take 1 capsule by mouth daily., Disp: , Rfl:   Past Medical History: Past Medical History:  Diagnosis Date  . Anxiety   . Arthritis   . Asthma   . Colon polyps   . COPD (chronic obstructive pulmonary disease) ([place])   . Depression   . Diabetes ([place])   . GERD (gastroesophageal reflux disease)   . High cholesterol   . Hyperlipidemia   . Hypertension   . Sleep apnea     Tobacco Use: History  Smoking Status  . Former Smoker  . Packs/day: 1.00  . Years: 15.00  . Types: Cigarettes  Smokeless Tobacco  . Never Used    Comment: quit 1977    Labs: Recent Review Flowsheet Data    Labs for ITP Cardiac and Pulmonary Rehab Latest Ref Rng & Units 06/26/2015 06/27/2015  Hemoglobin A1c 4.0 - 6.0 % 5.9 6.1(H)       Exercise Target Goals:    Exercise Program Goal: Individual exercise prescription set with THRR, safety & activity barriers. Participant demonstrates ability to understand and report RPE using BORG scale, to self-measure pulse accurately, and to acknowledge the importance of the exercise prescription.  Exercise Prescription Goal: Starting with aerobic activity 30 plus minutes a day, 3 days per week for initial exercise prescription. Provide home exercise prescription and guidelines that participant acknowledges understanding prior to discharge.  Activity Barriers & Risk Stratification:     Activity Barriers & Cardiac Risk Stratification - 05/08/17 1245      Activity Barriers & Cardiac Risk Stratification   Activity  Barriers Shortness of Breath;Chest Pain/Angina;Joint Problems;Deconditioning;Muscular Weakness;Balance Concerns  Possibly combination of COPD and angina, R shoulder pain    Cardiac Risk Stratification High      6 Minute Walk:     6 Minute Walk    Row Name 05/08/17 1334         6 Minute Walk   Phase Initial     Distance 1600 feet     Walk Time 6 minutes     # of Rest Breaks 0     MPH 3.03     METS 2.97     RPE 9     Perceived Dyspnea  1     VO2 Peak 10.4     Symptoms Yes (comment)     Comments SOB     Resting HR 74 bpm     Resting BP 132/66     Max Ex. HR 107 bpm     Max Ex. BP 132/64     2 Minute Post BP 126/60        Oxygen Initial Assessment:   Oxygen Re-Evaluation:   Oxygen Discharge (Final Oxygen Re-Evaluation):   Initial Exercise Prescription:     Initial Exercise Prescription - 05/08/17 1300      Date of Initial Exercise RX and Referring Provider   Date 05/08/17   Referring Provider Emily Filbert MD     Treadmill   MPH 2.5   Grade 0.5   Minutes 15   METs 3.09     Recumbant Bike   Level 2   RPM 25   Watts 28   Minutes 15   METs 2.87     NuStep   Level 2   SPM 80   Minutes 15   METs 2     Prescription Details   Frequency (times per week) 3   Duration Progress to 45 minutes of aerobic exercise without signs/symptoms of physical distress     Intensity   THRR 40-80% of Max Heartrate 109-134   Ratings of Perceived Exertion 11-13   Perceived Dyspnea 0-4     Progression   Progression Continue to progress workloads to maintain intensity without signs/symptoms of physical distress.     Resistance Training   Training Prescription Yes   Weight 3 lbs   Reps 10-15      Perform Capillary Blood Glucose checks as needed.  Exercise Prescription Changes:     Exercise Prescription Changes    Row Name 05/08/17 1200 05/25/17 1100 06/08/17 1300 06/12/17 1700       Response to Exercise   Blood Pressure (Admit) 132/66  - 128/78  -     Blood Pressure (Exercise) 132/64 146/68 142/76  -    Blood Pressure (Exit) 126/60 130/80 114/63  -  Heart Rate (Admit) 85 bpm 109 bpm 109 bpm  -    Heart Rate (Exercise) 107 bpm 126 bpm 115 bpm  -    Heart Rate (Exit) 92 bpm 97 bpm 103 bpm  -    Oxygen Saturation (Admit) 97 %  -  -  -    Oxygen Saturation (Exercise) 98 %  -  -  -    Rating of Perceived Exertion (Exercise) '9 15 13  '$ -    Symptoms SOB  -  -  -    Comments walk test results  -  -  -    Duration  - Continue with 45 min of aerobic exercise without signs/symptoms of physical distress. Continue with 45 min of aerobic exercise without signs/symptoms of physical distress. Continue with 45 min of aerobic exercise without signs/symptoms of physical distress.    Intensity  - THRR unchanged THRR unchanged THRR unchanged      Resistance Training   Training Prescription  - Yes Yes Yes    Weight  - '3 5 5    '$ Reps  - 10-15 10-15 10-15      Treadmill   MPH  - 2.5 2.5 2.5    Grade  - 0.5 0.5 0.5    Minutes  - '15 15 15    '$ METs  - 3.26 3.26 3.26      Recumbant Bike   Level  - '2 3 3    '$ Watts  - '28 19 19    '$ Minutes  - '15 15 15    '$ METs  - 2.97 2.7 2.7      NuStep   Level  - 2  -  -    SPM  - 80  -  -    Minutes  - 15  -  -    METs  - 3.3  -  -      Home Exercise Plan   Plans to continue exercise at  -  -  [employer] (comment)    Frequency  -  -  - Add 2 additional days to program exercise sessions.    Initial Home Exercises Provided  -  -  - 06/12/17       Exercise Comments:     Exercise Comments    Row Name 05/25/17 1115 06/08/17 1313 06/12/17 1731       Exercise Comments Shanon Brow has tolerated exercise well.  He has been out sick this week and is on antibiotics and pplans to return Monday. Rosana Hoes has tolerated exercise well in his first weeks.  Staff will monitor progression. Reviewed home exercise with pt today.  Pt plans to look into a community facility for exercise.  Reviewed THR, pulse, RPE, sign and symptoms,  NTG use, and when to call 911 or MD.  Also discussed weather considerations and indoor options.  Pt voiced understanding.        Exercise Goals and Review:     Exercise Goals    Row Name 05/08/17 1338             Exercise Goals   Increase Physical Activity Yes       Intervention Provide advice, education, support and counseling about physical activity/exercise needs.;Develop an individualized exercise prescription for aerobic and resistive training based on initial evaluation findings, risk stratification, comorbidities and participant's personal goals.       Expected Outcomes Achievement of increased cardiorespiratory fitness and enhanced flexibility, muscular endurance and strength shown through  measurements of functional capacity and personal statement of participant.       Increase Strength and Stamina Yes       Intervention Provide advice, education, support and counseling about physical activity/exercise needs.;Develop an individualized exercise prescription for aerobic and resistive training based on initial evaluation findings, risk stratification, comorbidities and participant's personal goals.       Expected Outcomes Achievement of increased cardiorespiratory fitness and enhanced flexibility, muscular endurance and strength shown through measurements of functional capacity and personal statement of participant.          Exercise Goals Re-Evaluation :   Discharge Exercise Prescription (Final Exercise Prescription Changes):     Exercise Prescription Changes - 06/12/17 1700      Response to Exercise   Duration Continue with 45 min of aerobic exercise without signs/symptoms of physical distress.   Intensity THRR unchanged     Resistance Training   Training Prescription Yes   Weight 5   Reps 10-15     Treadmill   MPH 2.5   Grade 0.5   Minutes 15   METs 3.26     Recumbant Bike   Level 3   Watts 19   Minutes 15   METs 2.7     Home Exercise Plan   Plans to  continue exercise at [employer] (comment)   Frequency Add 2 additional days to program exercise sessions.   Initial Home Exercises Provided 06/12/17      Nutrition:  Target Goals: Understanding of nutrition guidelines, daily intake of sodium '1500mg'$ , cholesterol '200mg'$ , calories 30% from fat and 7% or less from saturated fats, daily to have 5 or more servings of fruits and vegetables.  Biometrics:     Pre Biometrics - 05/08/17 1338      Pre Biometrics   Height 5' 5.2" (1.656 m)   Weight 196 lb 8 oz (89.1 kg)   Waist Circumference 43.5 inches   Hip Circumference 40.5 inches   Waist to Hip Ratio 1.07 %   BMI (Calculated) 32.6   Single Leg Stand 12.17 seconds       Nutrition Therapy Plan and Nutrition Goals:     Nutrition Therapy & Goals - 06/07/17 1705      Nutrition Therapy   RD appointment defered Yes      Nutrition Discharge: Rate Your Plate Scores:     Nutrition Assessments - 05/08/17 1237      MEDFICTS Scores   Pre Score 36      Nutrition Goals Re-Evaluation:     Nutrition Goals Re-Evaluation    [place] Name 06/07/17 1705             Goals   Current Weight 202 lb (91.6 kg)       Nutrition Goal "I know what to eat. I have had those nutrition classes before"       Comment [place] "Shanon Brow" declined my offer again today to see our Cardiac Rehab REgistered dieticain.        Expected Outcome Heart healthy eating          Nutrition Goals Discharge (Final Nutrition Goals Re-Evaluation):     Nutrition Goals Re-Evaluation - 06/07/17 1705      Goals   Current Weight 202 lb (91.6 kg)   Nutrition Goal "I know what to eat. I have had those nutrition classes before"   Comment [place] "Shanon Brow" declined my offer again today to see our Cardiac Rehab REgistered dieticain.    Expected Outcome Heart healthy eating  Psychosocial: Target Goals: Acknowledge presence or absence of significant depression and/or stress, maximize coping skills, provide  positive support system. Participant is able to verbalize types and ability to use techniques and skills needed for reducing stress and depression.   Initial Review & Psychosocial Screening:     Initial Psych Review & Screening - 05/08/17 1240      Initial Review   Current issues with Current Anxiety/Panic;Current Sleep Concerns  Anxiety when he is short of breath     Family Dynamics   Good Support System? Yes  Doristine Bosworth, wife     Barriers   Psychosocial barriers to participate in program There are no identifiable barriers or psychosocial needs.     Screening Interventions   Interventions Encouraged to exercise;Provide feedback about the scores to participant;Program counselor consult      Quality of Life Scores:      Quality of Life - 05/08/17 1242      Quality of Life Scores   Health/Function Pre 14.7 %   Socioeconomic Pre 23.25 %   Psych/Spiritual Pre 27.93 %   Family Pre 18 %   GLOBAL Pre 19.58 %      PHQ-9: Recent Review Flowsheet Data    Depression screen [facility] 2/9 05/08/2017   Decreased Interest 1   Down, Depressed, Hopeless 0   PHQ - 2 Score 1   Altered sleeping 2    Tired, decreased energy 2   Change in appetite 3    Feeling bad or failure about yourself  0   Trouble concentrating 1   Moving slowly or fidgety/restless 0   Suicidal thoughts 0   PHQ-9 Score 9   Difficult doing work/chores Somewhat difficult      Interpretation of Total Score  Total Score Depression Severity:  1-4 = Minimal depression, 5-9 = Mild depression, 10-14 = Moderate depression, 15-19 = Moderately severe depression, 20-27 = Severe depression   Psychosocial Evaluation and Intervention:     Psychosocial Evaluation - 05/15/17 1735      Psychosocial Evaluation & Interventions   Interventions Encouraged to exercise with the program and follow exercise prescription;Stress management education;Relaxation education   Comments Counselor met with Mr. Bastidas Shanon Brow) today for initial  psychosocial evaluation.  He is a 76 year old who struggles with angina and was recommended to this program by his Cardiologist.  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  Shanon Brow has multiple health issues with diabetes, asthma, COPD, Sleep Apnea in addition to his heart diagnosis.  He reports sleeping well lately but not well mostly which has been a chronic problem - even with the use occasionally of a CPAP.  His appetite has decreased significantly lately and he attributes that to possibly one of his medications.  He has a history of depression and some current symptoms of anxiety for which he has medication that helps.  He reports his mood is typically positive.  Shanon Brow has multiple stressors in his life with his health, his wife's health with back problems, and he is continuing to remodel his home - which he reports is a "2 year project!".  Shanon Brow has goals to lose more weight (he has lost 14 pounds this year so far) and increase his stamina and strength.  Staff will follow with Shanon Brow throughout the course of this program.     Expected Outcomes Shanon Brow will benefit  from consistent exercise to achieve his stated goals.  He will also benefit from the psychoeducational components of this program to help with his anxiety symptoms and learn positive ways to cope with stress.  Staff will follow with Shanon Brow.      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 " I encounraged him when he  went to exercise to get his exercise in and he said he could talk to someone while working out on the treadmilll like he did today.  Shanon Brow told us how he took his wife out where they went on their first date years ago. 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 " I encounraged him when he went to exercise to get his exercise in and he said he could talk to someone while working out on the treadmilll like he did today.  Shanon Brow told us how he took his wife out where they went on their first date years ago. His wife is a good support system for him    Interventions Encouraged to attend Cardiac Rehabilitation for the exercise      Vocational Rehabilitation: Provide vocational rehab assistance to qualifying candidates.   Vocational Rehab Evaluation & Intervention:     Vocational Rehab - 05/08/17 1232      Initial Vocational Rehab Evaluation & Intervention   Assessment shows need for Vocational Rehabilitation No      Education: Education Goals: Education classes will be provided on a weekly basis, covering required topics. Participant will state understanding/return demonstration of topics presented.  Learning Barriers/Preferences:     Learning Barriers/Preferences - 05/08/17 1231      Learning Barriers/Preferences   Learning Barriers Hearing   Learning Preferences Individual Instruction      Education Topics: General Nutrition Guidelines/Fats  and Fiber: -Group instruction provided by verbal, written material, models and posters to present the general guidelines for heart healthy nutrition. Gives an explanation and review of dietary fats and fiber.   Controlling Sodium/Reading Food Labels: -Group verbal and written material supporting the discussion of sodium use in heart healthy nutrition. Review and explanation with models, verbal and written materials for utilization of the food label.   Exercise Physiology & Risk Factors: - Group verbal and written instruction with models to review the exercise physiology of the cardiovascular system and associated critical values. Details cardiovascular disease risk factors and the  goals associated with each risk factor.   Aerobic Exercise & Resistance Training: - Gives group verbal and written discussion on the health impact of inactivity. On the components of aerobic and resistive training programs and the benefits of this training and how to safely progress through these programs.   Cardiac Rehab from 06/12/2017 in [facility] Cardiac and Pulmonary Rehab  Date  05/15/17  Educator  [facility]  Instruction Review Code  2- meets goals/outcomes      Flexibility, Balance, General Exercise Guidelines: - Provides group verbal and written instruction on the benefits of flexibility and balance training programs. Provides general exercise guidelines with specific guidelines to those with heart or lung disease. Demonstration and skill practice provided.   Stress Management: - Provides group verbal and written instruction about the health risks of elevated stress, cause of high stress, and healthy ways to reduce stress.   Depression: - Provides group verbal and written instruction on the correlation between heart/lung disease and depressed mood, treatment options, and the stigmas associated with seeking treatment.   Anatomy & Physiology of the Heart: - Group verbal and written instruction and models provide  basic cardiac anatomy and physiology, with the coronary electrical and arterial systems. Review of: AMI, Angina, Valve disease, Heart Failure, Cardiac Arrhythmia, Pacemakers, and the ICD.   Cardiac Procedures: - Group verbal and written instruction and models to describe the testing methods done to diagnose heart disease. Reviews the outcomes of the test results. Describes the treatment choices: Medical Management, Angioplasty, or Coronary Bypass Surgery.   Cardiac Rehab from 06/12/2017 in [facility] Cardiac and Pulmonary Rehab  Date  05/29/17  Educator  SB  Instruction Review Code  2- meets goals/outcomes      Cardiac Medications: - Group verbal and written instruction to review commonly prescribed medications for heart disease. Reviews the medication, class of the drug, and side effects. Includes the steps to properly store meds and maintain the prescription regimen.   Cardiac Rehab from 06/12/2017 in [facility] Cardiac and Pulmonary Rehab  Date  06/05/17 Marisue Humble 2]  Educator  SB  Instruction Review Code  2- meets goals/outcomes      Go Sex-Intimacy & Heart Disease, Get SMART - Goal Setting: - Group verbal and written instruction through game format to discuss heart disease and the return to sexual intimacy. Provides group verbal and written material to discuss and apply goal setting through the application of the S.M.A.R.T. Method.   Cardiac Rehab from 06/12/2017 in [facility] Cardiac and Pulmonary Rehab  Date  05/29/17  Educator  SB  Instruction Review Code  2- meets goals/outcomes      Other Matters of the Heart: - Provides group verbal, written materials and models to describe Heart Failure, Angina, Valve Disease, and Diabetes in the realm of heart disease. Includes description of the disease process and treatment options available to the cardiac patient.   Exercise & Equipment Safety: - Individual verbal instruction and demonstration of equipment use and safety with use of the equipment.    Cardiac Rehab from 06/12/2017 in [facility] Cardiac and Pulmonary Rehab  Date  05/08/17  Educator  SB  Instruction Review Code  2- meets goals/outcomes      Infection Prevention: - Provides verbal and written material to individual with discussion of infection control including proper hand washing and proper equipment cleaning during exercise session.   Cardiac Rehab from 06/12/2017 in [facility] Cardiac and Pulmonary Rehab  Date  05/08/17  Educator  SB  Instruction Review Code  2- meets  goals/outcomes      Falls Prevention: - Provides verbal and written material to individual with discussion of falls prevention and safety.   Cardiac Rehab from 06/12/2017 in [facility] Cardiac and Pulmonary Rehab  Date  05/08/17  Educator  SB  Instruction Review Code  2- meets goals/outcomes      Diabetes: - Individual verbal and written instruction to review signs/symptoms of diabetes, desired ranges of glucose level fasting, after meals and with exercise. Advice that pre and post exercise glucose checks will be done for 3 sessions at entry of program.   Cardiac Rehab from 06/12/2017 in [facility] Cardiac and Pulmonary Rehab  Date  05/08/17  Educator  [facility]  Instruction Review Code  2- meets goals/outcomes       Knowledge Questionnaire Score:     Knowledge Questionnaire Score - 05/08/17 1231      Knowledge Questionnaire Score   Pre Score 24/28  Reviewed correct answers with Shanon Brow, he acknowledged understanding      Core Components/Risk Factors/Patient Goals at Admission:     Personal Goals and Risk Factors at Admission - 05/08/17 1237      Core Components/Risk Factors/Patient Goals on Admission    Weight Management Yes;Obesity;Weight Loss   Intervention Weight Management/Obesity: Establish reasonable short term and long term weight goals.;Obesity: Provide education and appropriate resources to help participant work on and attain dietary goals.   Admit Weight 196 lb 8 oz (89.1 kg)   Goal Weight: Short Term  193 lb (87.5 kg)   Goal Weight: Long Term 170 lb (77.1 kg)   Expected Outcomes Short Term: Continue to assess and modify interventions until short term weight is achieved;Long Term: Adherence to nutrition and physical activity/exercise program aimed toward attainment of established weight goal;Weight Maintenance: Understanding of the daily nutrition guidelines, which includes 25-35% calories from fat, 7% or less cal from saturated fats, less than '200mg'$  cholesterol, less than 1.5gm of sodium, & 5 or more servings of fruits and vegetables daily;Weight Loss: Understanding of general recommendations for a balanced deficit meal plan, which promotes 1-2 lb weight loss per week and includes a negative energy balance of [phone] kcal/d  Weighed 213 lb in December 2017    Diabetes Yes   Intervention Provide education about signs/symptoms and action to take for hypo/hyperglycemia.;Provide education about proper nutrition, including hydration, and aerobic/resistive exercise prescription along with prescribed medications to achieve blood glucose in normal ranges: Fasting glucose 65-99 mg/dL   Expected Outcomes Short Term: Participant verbalizes understanding of the signs/symptoms and immediate care of hyper/hypoglycemia, proper foot care and importance of medication, aerobic/resistive exercise and nutrition plan for blood glucose control.;Long Term: Attainment of HbA1C < 7%.   Hypertension Yes   Intervention Provide education on lifestyle modifcations including regular physical activity/exercise, weight management, moderate sodium restriction and increased consumption of fresh fruit, vegetables, and low fat dairy, alcohol moderation, and smoking cessation.;Monitor prescription use compliance.   Expected Outcomes Short Term: Continued assessment and intervention until BP is < 140/46m HG in hypertensive participants. < 130/836mHG in hypertensive participants with diabetes, heart failure or chronic kidney disease.;Long  Term: Maintenance of blood pressure at goal levels.   Lipids Yes   Intervention Provide education and support for participant on nutrition & aerobic/resistive exercise along with prescribed medications to achieve LDL '70mg'$ , HDL >'40mg'$ .   Expected Outcomes Short Term: Participant states understanding of desired cholesterol values and is compliant with medications prescribed. Participant is following exercise prescription and nutrition guidelines.;Long Term: Cholesterol controlled with medications as  prescribed, with individualized exercise RX and with personalized nutrition plan. Value goals: LDL < '70mg'$ , HDL > 40 mg.      Core Components/Risk Factors/Patient Goals Review:      Goals and Risk Factor Review    Row Name 06/07/17 1707 06/07/17 1711           Core Components/Risk Factors/Patient Goals Review   Personal Goals Review Weight Management/Obesity  -      Review 202lbs today "but i have been able to tighten my belt. My clothes feel better. I can come in here not feeling well but once I exercise I feel much better. I really believe this is helping me. I have Silver Sneakers so I am going to check about a gym in [place]." "I know I need to cont this exercise program.  Shanon Brow reports that he used to have to check his blood sugars but his is on Glipiride and that keeps his blood sugars under control.       Expected Outcomes Heart healthy lifestyle. Try to lose more weight.          Core Components/Risk Factors/Patient Goals at Discharge (Final Review):      Goals and Risk Factor Review - 06/07/17 1711      Core Components/Risk Factors/Patient Goals Review   Review Shanon Brow reports that he used to have to check his blood sugars but his is on Glipiride and that keeps his blood sugars under control.    Expected Outcomes Try to lose more weight.       ITP Comments:     ITP Comments    Row Name 05/08/17 1235 05/17/17 0927 06/14/17 0610       ITP Comments Medical review completed.  Initial ITP created. Documentation of diagnosis can be found in Care Everywhere 04/26/17 Office note.  30 day review. Continue with ITP unless directed changes per Medical Director review  New to program 30 day review. Continue with ITP unless directed changes per Medical Director review        Comments:

## 2017-06-14 NOTE — Progress Notes (Signed)
Daily Session Note  Patient Details  Name: Brett Blake MRN: [id number] Date of Birth: 10/29/1941 Referring Provider:     Cardiac Rehab from 05/08/2017 in [facility] Cardiac and Pulmonary Rehab  Referring Provider  Emily Filbert MD      Encounter Date: 06/14/2017  Check In:     Session Check In - 06/14/17 1703      Check-In   Location [facility]-Cardiac & Pulmonary Rehab   Staff Present Renita Papa, RN BSN;Carroll Enterkin, RN, Vickki Hearing, BA, ACSM CEP, Exercise Physiologist   Supervising physician immediately available to respond to emergencies See telemetry face sheet for immediately available ER MD   Medication changes reported     No   Fall or balance concerns reported    No   Warm-up and Cool-down Performed on first and last piece of equipment   Resistance Training Performed Yes   VAD Patient? No     Pain Assessment   Currently in Pain? No/denies         History  Smoking Status  . Former Smoker  . Packs/day: 1.00  . Years: 15.00  . Types: Cigarettes  Smokeless Tobacco  . Never Used    Comment: quit 1977    Goals Met:  Proper associated with RPD/PD & O2 Sat Independence with exercise equipment Exercise tolerated well No report of cardiac concerns or symptoms Strength training completed today  Goals Unmet:  Not Applicable  Comments: Pt able to follow exercise prescription today without complaint.  Will continue to monitor for progression.    Dr. Emily Filbert is Medical Director for [place] and [facility].

## 2017-06-15 DIAGNOSIS — I208 Other forms of angina pectoris: Secondary | ICD-10-CM

## 2017-06-15 NOTE — Progress Notes (Signed)
Daily Session Note  Patient Details  Name: Brett Blake MRN: [id number] Date of Birth: Mar 28, 1941 Referring Provider:     Cardiac Rehab from 05/08/2017 in [facility] Cardiac and Pulmonary Rehab  Referring Provider  Emily Filbert MD      Encounter Date: 06/15/2017  Check In:     Session Check In - 06/15/17 1713      Check-In   Location [facility]-Cardiac & Pulmonary Rehab   Staff Present Nyoka Cowden, RN, BSN, Kela Millin, BA, ACSM CEP, Exercise Physiologist;Kelly Amedeo Plenty, BS, ACSM CEP, Exercise Physiologist   Supervising physician immediately available to respond to emergencies See telemetry face sheet for immediately available ER MD   Medication changes reported     No   Fall or balance concerns reported    No   Warm-up and Cool-down Performed on first and last piece of equipment   Resistance Training Performed Yes   VAD Patient? No     Pain Assessment   Currently in Pain? No/denies         History  Smoking Status  . Former Smoker  . Packs/day: 1.00  . Years: 15.00  . Types: Cigarettes  Smokeless Tobacco  . Never Used    Comment: quit 1977    Goals Met:  Independence with exercise equipment Exercise tolerated well No report of cardiac concerns or symptoms Strength training completed today  Goals Unmet:  Not Applicable  Comments: Pt able to follow exercise prescription today without complaint.  Will continue to monitor for progression.    Dr. Emily Filbert is Medical Director for [place] and [facility].

## 2017-06-19 ENCOUNTER — Encounter: Payer: Medicare Other | Admitting: *Deleted

## 2017-06-19 DIAGNOSIS — I208 Other forms of angina pectoris: Secondary | ICD-10-CM

## 2017-06-19 DIAGNOSIS — I2089 Other forms of angina pectoris: Secondary | ICD-10-CM

## 2017-06-19 NOTE — Progress Notes (Signed)
Daily Session Note  Patient Details  Name: Brett Blake MRN: [id number] Date of Birth: 10/11/1941 Referring Provider:     Cardiac Rehab from 05/08/2017 in  [facility] Cardiac and Pulmonary Rehab  Referring Provider  Emily Filbert MD      Encounter Date: 06/19/2017  Check In:     Session Check In - 06/19/17 1624      Check-In   Location [facility]-Cardiac & Pulmonary Rehab   Staff Present Renita Papa, RN BSN;Susanne Bice, RN, BSN, Laveda Norman, BS, ACSM CEP, Exercise Physiologist   Supervising physician immediately available to respond to emergencies See telemetry face sheet for immediately available ER MD   Medication changes reported     No   Fall or balance concerns reported    No   Warm-up and Cool-down Performed on first and last piece of equipment   Resistance Training Performed Yes   VAD Patient? No     Pain Assessment   Currently in Pain? No/denies   Multiple Pain Sites No         History  Smoking Status  . Former Smoker  . Packs/day: 1.00  . Years: 15.00  . Types: Cigarettes  Smokeless Tobacco  . Never Used    Comment: quit 1977    Goals Met:  Independence with exercise equipment Exercise tolerated well No report of cardiac concerns or symptoms Strength training completed today  Goals Unmet:  Not Applicable  Comments: Pt able to follow exercise prescription today without complaint.  Will continue to monitor for progression.    Dr. Emily Filbert is Medical Director for [place] and [facility].

## 2017-06-21 ENCOUNTER — Encounter: Payer: Medicare Other | Admitting: *Deleted

## 2017-06-21 DIAGNOSIS — I208 Other forms of angina pectoris: Secondary | ICD-10-CM

## 2017-06-21 NOTE — Progress Notes (Signed)
Daily Session Note  Patient Details  Name: Brett Blake MRN: [id number] Date of Birth: Aug 02, 1941 Referring Provider:     Cardiac Rehab from 05/08/2017 in [facility] Cardiac and Pulmonary Rehab  Referring Provider  Emily Filbert MD      Encounter Date: 06/21/2017  Check In:     Session Check In - 06/21/17 1629      Check-In   Staff Present Nyoka Cowden, RN, BSN, MA;Meredith Sherryll Burger, RN BSN   Medication changes reported     No   Fall or balance concerns reported    No   Tobacco Cessation No Change   Warm-up and Cool-down Performed on first and last piece of equipment   Resistance Training Performed Yes   VAD Patient? No     Pain Assessment   Currently in Pain? No/denies           Exercise Prescription Changes - 06/21/17 1400      Response to Exercise   Blood Pressure (Admit) 152/80   Blood Pressure (Exercise) 160/80   Blood Pressure (Exit) 120/70   Heart Rate (Admit) 93 bpm   Heart Rate (Exercise) 151 bpm   Heart Rate (Exit) 87 bpm   Rating of Perceived Exertion (Exercise) 13   Symptoms none   Duration Continue with 45 min of aerobic exercise without signs/symptoms of physical distress.   Intensity THRR unchanged     Progression   Progression Continue to progress workloads to maintain intensity without signs/symptoms of physical distress.   Average METs 3.43     Resistance Training   Training Prescription Yes   Weight 5lbs   Reps 10-15     Interval Training   Interval Training No     Treadmill   MPH 2.5   Grade 1.5   Minutes 15   METs 3.43     Recumbant Bike   Level 5   Watts 28   Minutes 15   METs 3.05     NuStep   Level 4   Minutes 15   METs 3.8     Home Exercise Plan   Plans to continue exercise at [employer] (comment)   Frequency Add 2 additional days to program exercise sessions.   Initial Home Exercises Provided 06/12/17      History  Smoking Status  . Former Smoker  . Packs/day: 1.00  . Years: 15.00  . Types:  Cigarettes  Smokeless Tobacco  . Never Used    Comment: quit 1977    Goals Met:  Proper associated with RPD/PD & O2 Sat Exercise tolerated well No report of cardiac concerns or symptoms Strength training completed today  Goals Unmet:  Not Applicable  Comments:     Dr. Emily Filbert is Medical Director for [place] and [facility].

## 2017-06-22 DIAGNOSIS — I208 Other forms of angina pectoris: Secondary | ICD-10-CM | POA: Diagnosis not present

## 2017-06-26 ENCOUNTER — Encounter: Payer: Medicare Other | Attending: Internal Medicine | Admitting: *Deleted

## 2017-06-26 DIAGNOSIS — I208 Other forms of angina pectoris: Secondary | ICD-10-CM | POA: Diagnosis present

## 2017-06-26 NOTE — Progress Notes (Signed)
Daily Session Note  Patient Details  Name: Brett Blake MRN: [id number] Date of Birth: 05-Dec-1941 Referring Provider:     Cardiac Rehab from 05/08/2017 in [facility] Cardiac and Pulmonary Rehab  Referring Provider  Emily Filbert MD      Encounter Date: 06/26/2017  Check In:     Session Check In - 06/26/17 1625      Check-In   Location [facility]-Cardiac & Pulmonary Rehab   Staff Present Renita Papa, RN Moises Blood, BS, ACSM CEP, Exercise Physiologist;Kaysi Ourada Oletta Darter, [place], ACSM CEP, Exercise Physiologist   Supervising physician immediately available to respond to emergencies See telemetry face sheet for immediately available ER MD   Medication changes reported     No   Fall or balance concerns reported    No   Warm-up and Cool-down Performed on first and last piece of equipment   Resistance Training Performed Yes   VAD Patient? No     Pain Assessment   Currently in Pain? No/denies   Multiple Pain Sites No         History  Smoking Status  . Former Smoker  . Packs/day: 1.00  . Years: 15.00  . Types: Cigarettes  Smokeless Tobacco  . Never Used    Comment: quit 1977    Goals Met:  Independence with exercise equipment Exercise tolerated well No report of cardiac concerns or symptoms Strength training completed today  Goals Unmet:  Not Applicable  Comments: Pt able to follow exercise prescription today without complaint.  Will continue to monitor for progression.    Dr. Emily Filbert is Medical Director for [place] and [facility].

## 2017-06-29 DIAGNOSIS — I208 Other forms of angina pectoris: Secondary | ICD-10-CM

## 2017-06-29 NOTE — Progress Notes (Signed)
Daily Session Note  Patient Details  Name: Brett Blake MRN: [id number] Date of Birth: 06/12/1941 Referring Provider:     Cardiac Rehab from 05/08/2017 in [facility] Cardiac and Pulmonary Rehab  Referring Provider  Emily Filbert MD      Encounter Date: 06/29/2017  Check In:     Session Check In - 06/29/17 1717      Check-In   Location [facility]-Cardiac & Pulmonary Rehab   Staff Present Gerlene Burdock, RN, Moises Blood, BS, ACSM CEP, Exercise Physiologist;Consetta Cosner Flavia Shipper   Supervising physician immediately available to respond to emergencies See telemetry face sheet for immediately available ER MD   Medication changes reported     No   Fall or balance concerns reported    No   Tobacco Cessation No Change   Warm-up and Cool-down Performed on first and last piece of equipment   Resistance Training Performed Yes   VAD Patient? No     Pain Assessment   Currently in Pain? No/denies   Multiple Pain Sites No         History  Smoking Status  . Former Smoker  . Packs/day: 1.00  . Years: 15.00  . Types: Cigarettes  Smokeless Tobacco  . Never Used    Comment: quit 1977    Goals Met:  Independence with exercise equipment Exercise tolerated well No report of cardiac concerns or symptoms Strength training completed today  Goals Unmet:  Not Applicable  Comments: Pt able to follow exercise prescription today without complaint.  Will continue to monitor for progression.   Dr. Emily Filbert is Medical Director for [place] and [facility].

## 2017-07-03 ENCOUNTER — Encounter: Payer: Medicare Other | Admitting: *Deleted

## 2017-07-03 DIAGNOSIS — I208 Other forms of angina pectoris: Secondary | ICD-10-CM

## 2017-07-03 NOTE — Progress Notes (Signed)
Daily Session Note  Patient Details  Name: Brett Blake MRN: [id number] Date of Birth: Aug 03, 1941 Referring Provider:     Cardiac Rehab from 05/08/2017 in [facility] Cardiac and Pulmonary Rehab  Referring Provider  Emily Filbert MD      Encounter Date: 07/03/2017  Check In:     Session Check In - 07/03/17 1702      Check-In   Location [facility]-Cardiac & Pulmonary Rehab   Staff Present Nyoka Cowden, RN, BSN, MA;Hani Campusano Sherryll Burger, RN Moises Blood, BS, ACSM CEP, Exercise Physiologist;Joseph Flavia Shipper   Supervising physician immediately available to respond to emergencies See telemetry face sheet for immediately available ER MD   Medication changes reported     No   Fall or balance concerns reported    No   Warm-up and Cool-down Performed on first and last piece of equipment   Resistance Training Performed Yes   VAD Patient? No     Pain Assessment   Currently in Pain? No/denies         History  Smoking Status  . Former Smoker  . Packs/day: 1.00  . Years: 15.00  . Types: Cigarettes  Smokeless Tobacco  . Never Used    Comment: quit 1977    Goals Met:  Proper associated with RPD/PD & O2 Sat Independence with exercise equipment Exercise tolerated well No report of cardiac concerns or symptoms Strength training completed today  Goals Unmet:  Not Applicable  Comments: Pt able to follow exercise prescription today without complaint.  Will continue to monitor for progression.    Dr. Emily Filbert is Medical Director for [place] and [facility].

## 2017-07-05 ENCOUNTER — Encounter: Payer: Medicare Other | Admitting: *Deleted

## 2017-07-05 DIAGNOSIS — I208 Other forms of angina pectoris: Secondary | ICD-10-CM

## 2017-07-05 NOTE — Progress Notes (Signed)
Daily Session Note  Patient Details  Name: Brett Blake MRN: [id number] Date of Birth: April 02, 1941 Referring Provider:     Cardiac Rehab from 05/08/2017 in [facility] Cardiac and Pulmonary Rehab  Referring Provider  Emily Filbert MD      Encounter Date: 07/05/2017  Check In:     Session Check In - 07/05/17 1701      Check-In   Location [facility]-Cardiac & Pulmonary Rehab   Staff Present Nyoka Cowden, RN, BSN, MA;Susanne Bice, RN, BSN, CCRP;Wayland Baik Sherryll Burger, RN Vickki Hearing, BA, ACSM CEP, Exercise Physiologist   Supervising physician immediately available to respond to emergencies See telemetry face sheet for immediately available ER MD   Medication changes reported     No   Fall or balance concerns reported    No   Warm-up and Cool-down Performed on first and last piece of equipment   Resistance Training Performed Yes   VAD Patient? No     Pain Assessment   Currently in Pain? No/denies         History  Smoking Status  . Former Smoker  . Packs/day: 1.00  . Years: 15.00  . Types: Cigarettes  Smokeless Tobacco  . Never Used    Comment: quit 1977    Goals Met:  Proper associated with RPD/PD & O2 Sat Independence with exercise equipment Exercise tolerated well No report of cardiac concerns or symptoms Strength training completed today  Goals Unmet:  Not Applicable  Comments: Pt able to follow exercise prescription today without complaint.  Will continue to monitor for progression.    Dr. Emily Filbert is Medical Director for [place] and [facility].

## 2017-07-06 DIAGNOSIS — I208 Other forms of angina pectoris: Secondary | ICD-10-CM | POA: Diagnosis not present

## 2017-07-06 NOTE — Progress Notes (Signed)
Daily Session Note  Patient Details  Name: Brett Blake MRN: [id number] Date of Birth: 1941/01/10 Referring Provider:     Cardiac Rehab from 05/08/2017 in [facility] Cardiac and Pulmonary Rehab  Referring Provider  Emily Filbert MD      Encounter Date: 07/06/2017  Check In:     Session Check In - 07/06/17 1624      Check-In   Location [facility]-Cardiac & Pulmonary Rehab   Staff Present Nyoka Cowden, RN, BSN, [place], BS, ACSM CEP, Exercise Physiologist;Dhalia Zingaro Flavia Shipper   Supervising physician immediately available to respond to emergencies See telemetry face sheet for immediately available ER MD   Medication changes reported     No   Fall or balance concerns reported    No   Tobacco Cessation No Change   Warm-up and Cool-down Performed on first and last piece of equipment   Resistance Training Performed Yes   VAD Patient? No     Pain Assessment   Currently in Pain? No/denies   Multiple Pain Sites No         History  Smoking Status  . Former Smoker  . Packs/day: 1.00  . Years: 15.00  . Types: Cigarettes  Smokeless Tobacco  . Never Used    Comment: quit 1977    Goals Met:  Independence with exercise equipment Exercise tolerated well No report of cardiac concerns or symptoms Strength training completed today  Goals Unmet:  Not Applicable  Comments: Pt able to follow exercise prescription today without complaint.  Will continue to monitor for progression.   Dr. Emily Filbert is Medical Director for [place] and [facility].

## 2017-07-10 ENCOUNTER — Telehealth: Payer: Self-pay

## 2017-07-10 ENCOUNTER — Encounter: Payer: Medicare Other | Admitting: *Deleted

## 2017-07-10 DIAGNOSIS — I208 Other forms of angina pectoris: Secondary | ICD-10-CM | POA: Diagnosis not present

## 2017-07-10 NOTE — Progress Notes (Signed)
Daily Session Note  Patient Details  Name: Brett Blake MRN: [id number] Date of Birth: 1941-11-25 Referring Provider:     Cardiac Rehab from 05/08/2017 in [facility] Cardiac and Pulmonary Rehab  Referring Provider  Emily Filbert MD      Encounter Date: 07/10/2017  Check In:     Session Check In - 07/10/17 1725      Check-In   Location [facility]-Cardiac & Pulmonary Rehab   Staff Present Earlean Shawl, BS, ACSM CEP, Exercise Physiologist;Amanda Oletta Darter, BA, ACSM CEP, Exercise Physiologist;Krista Frederico Hamman, RN BSN   Supervising physician immediately available to respond to emergencies See telemetry face sheet for immediately available ER MD   Medication changes reported     No   Fall or balance concerns reported    No   Warm-up and Cool-down Performed on first and last piece of equipment   Resistance Training Performed Yes   VAD Patient? No     Pain Assessment   Currently in Pain? No/denies   Multiple Pain Sites No         History  Smoking Status  . Former Smoker  . Packs/day: 1.00  . Years: 15.00  . Types: Cigarettes  Smokeless Tobacco  . Never Used    Comment: quit 1977    Goals Met:  Independence with exercise equipment Exercise tolerated well No report of cardiac concerns or symptoms Strength training completed today  Goals Unmet:  Not Applicable  Comments: Pt able to follow exercise prescription today without complaint.  Will continue to monitor for progression.    Dr. Emily Filbert is Medical Director for [place] and [facility].

## 2017-07-10 NOTE — Telephone Encounter (Signed)
Erroneous encounter opened in Error

## 2017-07-12 ENCOUNTER — Encounter: Payer: Medicare Other | Admitting: *Deleted

## 2017-07-12 ENCOUNTER — Encounter: Payer: Self-pay | Admitting: *Deleted

## 2017-07-12 DIAGNOSIS — I208 Other forms of angina pectoris: Secondary | ICD-10-CM

## 2017-07-12 DIAGNOSIS — I2089 Other forms of angina pectoris: Secondary | ICD-10-CM

## 2017-07-12 NOTE — Progress Notes (Signed)
Daily Session Note  Patient Details  Name: Brett Blake MRN: [id number] Date of Birth: 1941-08-06 Referring Provider:     Cardiac Rehab from 05/08/2017 in [facility] Cardiac and Pulmonary Rehab  Referring Provider  Emily Filbert MD      Encounter Date: 07/12/2017  Check In:     Session Check In - 07/12/17 1616      Check-In   Location [facility]-Cardiac & Pulmonary Rehab   Staff Present Nyoka Cowden, RN, BSN, MA;Jacquline Terrill Sherryll Burger, RN Vickki Hearing, BA, ACSM CEP, Exercise Physiologist   Supervising physician immediately available to respond to emergencies See telemetry face sheet for immediately available ER MD   Medication changes reported     No   Fall or balance concerns reported    No   Warm-up and Cool-down Performed on first and last piece of equipment   Resistance Training Performed Yes   VAD Patient? No     Pain Assessment   Currently in Pain? No/denies         History  Smoking Status  . Former Smoker  . Packs/day: 1.00  . Years: 15.00  . Types: Cigarettes  Smokeless Tobacco  . Never Used    Comment: quit 1977    Goals Met:  Proper associated with RPD/PD & O2 Sat Independence with exercise equipment Exercise tolerated well No report of cardiac concerns or symptoms Strength training completed today  Goals Unmet:  Not Applicable  Comments: Pt able to follow exercise prescription today without complaint.  Will continue to monitor for progression.    Dr. Emily Filbert is Medical Director for [place] and [facility].

## 2017-07-12 NOTE — Progress Notes (Signed)
Cardiac Individual Treatment Plan  Patient Details  Name: Brett Blake MRN: [id number] Date of Birth: 01/14/1941 Referring Provider:     Cardiac Rehab from 05/08/2017 in [facility] Cardiac and Pulmonary Rehab  Referring Provider  Emily Filbert MD      Initial Encounter Date:    Cardiac Rehab from 05/08/2017 in [facility] Cardiac and Pulmonary Rehab  Date  05/08/17  Referring Provider  Emily Filbert MD      Visit Diagnosis: Stable angina [facility])  Patient's Home Medications on Admission:  Current Outpatient Prescriptions:  .  ALPRAZolam (XANAX) 0.5 MG tablet, Take 1 tablet (0.5 mg total) by mouth 3 (three) times daily as needed for sleep or anxiety., Disp: 15 tablet, Rfl: 0 .  amLODipine (NORVASC) 5 MG tablet, Take 5 mg by mouth daily., Disp: , Rfl: 3 .  aspirin 81 MG chewable tablet, Chew 81 mg by mouth daily., Disp: , Rfl:  .  azithromycin (ZITHROMAX) 250 MG tablet, Take 1 tablet (250 mg total) by mouth daily. (Patient not taking: Reported on 05/08/2017), Disp: 4 each, Rfl: 0 .  bisoprolol (ZEBETA) 5 MG tablet, Take 5 mg by mouth 2 (two) times daily., Disp: , Rfl:  .  esomeprazole (NEXIUM) 20 MG capsule, Take 20 mg by mouth daily., Disp: , Rfl:  .  fexofenadine (ALLEGRA) 180 MG tablet, Take 180 mg by mouth daily as needed for allergies or rhinitis., Disp: , Rfl:  .  fluticasone (FLONASE) 50 MCG/ACT nasal spray, Place 1 spray into both nostrils daily as needed for allergies or rhinitis., Disp: , Rfl:  .  glimepiride (AMARYL) 1 MG tablet, Take 1 mg by mouth daily. , Disp: , Rfl:  .  glucosamine-chondroitin 500-400 MG tablet, Take 1 tablet by mouth daily., Disp: , Rfl:  .  ibuprofen (ADVIL,MOTRIN) 200 MG tablet, Take 200 mg by mouth every 6 (six) hours as needed., Disp: , Rfl:  .  metoprolol succinate (TOPROL-XL) 25 MG 24 hr tablet, Take 25 mg by mouth daily., Disp: , Rfl:  .  montelukast (SINGULAIR) 10 MG tablet, Take 10 mg by mouth at bedtime., Disp: , Rfl:  .  Multiple Vitamins-Minerals  (MULTIVITAMIN PO), Take 1 tablet by mouth daily., Disp: , Rfl:  .  nitroGLYCERIN (NITROSTAT) 0.4 MG SL tablet, Place 0.4 mg under the tongue every 5 (five) minutes as needed for chest pain., Disp: , Rfl:  .  ranitidine (ZANTAC) 150 MG tablet, Take 150 mg by mouth at bedtime as needed for heartburn., Disp: , Rfl:  .  simvastatin (ZOCOR) 20 MG tablet, Take 20 mg by mouth daily., Disp: , Rfl:  .  triamterene-hydrochlorothiazide (DYAZIDE) 37.5-25 MG per capsule, Take 1 capsule by mouth daily., Disp: , Rfl:   Past Medical History: Past Medical History:  Diagnosis Date  . Anxiety   . Arthritis   . Asthma   . Colon polyps   . COPD (chronic obstructive pulmonary disease) ([place])   . Depression   . Diabetes ([place])   . GERD (gastroesophageal reflux disease)   . High cholesterol   . Hyperlipidemia   . Hypertension   . Sleep apnea     Tobacco Use: History  Smoking Status  . Former Smoker  . Packs/day: 1.00  . Years: 15.00  . Types: Cigarettes  Smokeless Tobacco  . Never Used    Comment: quit 1977    Labs: Recent Review Flowsheet Data    Labs for ITP Cardiac and Pulmonary Rehab Latest Ref Rng & Units 06/26/2015 06/27/2015  Hemoglobin A1c 4.0 - 6.0 % 5.9 6.1(H)       Exercise Target Goals:    Exercise Program Goal: Individual exercise prescription set with THRR, safety & activity barriers. Participant demonstrates ability to understand and report RPE using BORG scale, to self-measure pulse accurately, and to acknowledge the importance of the exercise prescription.  Exercise Prescription Goal: Starting with aerobic activity 30 plus minutes a day, 3 days per week for initial exercise prescription. Provide home exercise prescription and guidelines that participant acknowledges understanding prior to discharge.  Activity Barriers & Risk Stratification:     Activity Barriers & Cardiac Risk Stratification - 05/08/17 1245      Activity Barriers & Cardiac Risk Stratification   Activity  Barriers Shortness of Breath;Chest Pain/Angina;Joint Problems;Deconditioning;Muscular Weakness;Balance Concerns  Possibly combination of COPD and angina, R shoulder pain    Cardiac Risk Stratification High      6 Minute Walk:     6 Minute Walk    Row Name 05/08/17 1334         6 Minute Walk   Phase Initial     Distance 1600 feet     Walk Time 6 minutes     # of Rest Breaks 0     MPH 3.03     METS 2.97     RPE 9     Perceived Dyspnea  1     VO2 Peak 10.4     Symptoms Yes (comment)     Comments SOB     Resting HR 74 bpm     Resting BP 132/66     Max Ex. HR 107 bpm     Max Ex. BP 132/64     2 Minute Post BP 126/60        Oxygen Initial Assessment:   Oxygen Re-Evaluation:   Oxygen Discharge (Final Oxygen Re-Evaluation):   Initial Exercise Prescription:     Initial Exercise Prescription - 05/08/17 1300      Date of Initial Exercise RX and Referring Provider   Date 05/08/17   Referring Provider Emily Filbert MD     Treadmill   MPH 2.5   Grade 0.5   Minutes 15   METs 3.09     Recumbant Bike   Level 2   RPM 25   Watts 28   Minutes 15   METs 2.87     NuStep   Level 2   SPM 80   Minutes 15   METs 2     Prescription Details   Frequency (times per week) 3   Duration Progress to 45 minutes of aerobic exercise without signs/symptoms of physical distress     Intensity   THRR 40-80% of Max Heartrate 109-134   Ratings of Perceived Exertion 11-13   Perceived Dyspnea 0-4     Progression   Progression Continue to progress workloads to maintain intensity without signs/symptoms of physical distress.     Resistance Training   Training Prescription Yes   Weight 3 lbs   Reps 10-15      Perform Capillary Blood Glucose checks as needed.  Exercise Prescription Changes:     Exercise Prescription Changes    Row Name 05/08/17 1200 05/25/17 1100 06/08/17 1300 06/12/17 1700 06/21/17 1400     Response to Exercise   Blood Pressure (Admit) 132/66  -  128/78  - 152/80   Blood Pressure (Exercise) 132/64 146/68 142/76  - 160/80   Blood Pressure (Exit) 126/60 130/80 114/63  - 120/70  Heart Rate (Admit) 85 bpm 109 bpm 109 bpm  - 93 bpm   Heart Rate (Exercise) 107 bpm 126 bpm 115 bpm  - 151 bpm   Heart Rate (Exit) 92 bpm 97 bpm 103 bpm  - 87 bpm   Oxygen Saturation (Admit) 97 %  -  -  -  -   Oxygen Saturation (Exercise) 98 %  -  -  -  -   Rating of Perceived Exertion (Exercise) '9 15 13  '$ - 13   Symptoms SOB  -  -  - none   Comments walk test results  -  -  -  -   Duration  - Continue with 45 min of aerobic exercise without signs/symptoms of physical distress. Continue with 45 min of aerobic exercise without signs/symptoms of physical distress. Continue with 45 min of aerobic exercise without signs/symptoms of physical distress. Continue with 45 min of aerobic exercise without signs/symptoms of physical distress.   Intensity  - THRR unchanged THRR unchanged THRR unchanged THRR unchanged     Progression   Progression  -  -  -  - Continue to progress workloads to maintain intensity without signs/symptoms of physical distress.   Average METs  -  -  -  - 3.43     Resistance Training   Training Prescription  - Yes Yes Yes Yes   Weight  - '3 5 5 '$ 5lbs   Reps  - 10-15 10-15 10-15 10-15     Interval Training   Interval Training  -  -  -  - No     Treadmill   MPH  - 2.5 2.5 2.5 2.5   Grade  - 0.5 0.5 0.5 1.5   Minutes  - '15 15 15 15   '$ METs  - 3.26 3.26 3.26 3.43     Recumbant Bike   Level  - '2 3 3 5   '$ Watts  - '28 19 19 28   '$ Minutes  - '15 15 15 15   '$ METs  - 2.97 2.7 2.7 3.05     NuStep   Level  - 2  -  - 4   SPM  - 80  -  -  -   Minutes  - 15  -  - 15   METs  - 3.3  -  - 3.8     Home Exercise Plan   Plans to continue exercise at  -  -  - [employer] (comment) Forensic scientist (comment)   Frequency  -  -  - Add 2 additional days to program exercise sessions. Add 2 additional days to program exercise sessions.   Initial Home  Exercises Provided  -  -  - 06/12/17 06/12/17   Row Name 07/07/17 1200             Response to Exercise   Blood Pressure (Admit) 110/62       Blood Pressure (Exercise) 150/90       Blood Pressure (Exit) 110/64       Heart Rate (Admit) 67 bpm       Heart Rate (Exercise) 134 bpm       Heart Rate (Exit) 98 bpm       Rating of Perceived Exertion (Exercise) 13       Symptoms none       Duration Continue with 45 min of aerobic exercise without signs/symptoms of physical distress.       Intensity THRR unchanged  Progression   Progression Continue to progress workloads to maintain intensity without signs/symptoms of physical distress.       Average METs 3.2         Resistance Training   Training Prescription Yes       Weight 5 lb       Reps 10-15         Interval Training   Interval Training No         Treadmill   MPH 2.5       Grade 1.5       Minutes 15       METs 3.43         Recumbant Bike   Level 5       Watts 30       Minutes 15       METs 2.97          Exercise Comments:     Exercise Comments    Row Name 05/25/17 1115 06/08/17 1313 06/12/17 1731       Exercise Comments Brett Blake has tolerated exercise well.  He has been out sick this week and is on antibiotics and pplans to return Monday. Rosana Hoes has tolerated exercise well in his first weeks.  Staff will monitor progression. Reviewed home exercise with pt today.  Pt plans to look into a community facility for exercise.  Reviewed THR, pulse, RPE, sign and symptoms, NTG use, and when to call 911 or MD.  Also discussed weather considerations and indoor options.  Pt voiced understanding.        Exercise Goals and Review:     Exercise Goals    Row Name 05/08/17 1338             Exercise Goals   Increase Physical Activity Yes       Intervention Provide advice, education, support and counseling about physical activity/exercise needs.;Develop an individualized exercise prescription for aerobic and resistive  training based on initial evaluation findings, risk stratification, comorbidities and participant's personal goals.       Expected Outcomes Achievement of increased cardiorespiratory fitness and enhanced flexibility, muscular endurance and strength shown through measurements of functional capacity and personal statement of participant.       Increase Strength and Stamina Yes       Intervention Provide advice, education, support and counseling about physical activity/exercise needs.;Develop an individualized exercise prescription for aerobic and resistive training based on initial evaluation findings, risk stratification, comorbidities and participant's personal goals.       Expected Outcomes Achievement of increased cardiorespiratory fitness and enhanced flexibility, muscular endurance and strength shown through measurements of functional capacity and personal statement of participant.          Exercise Goals Re-Evaluation :     Exercise Goals Re-Evaluation    Row Name 06/21/17 1419 07/07/17 1247           Exercise Goal Re-Evaluation   Exercise Goals Review Increase Physical Activity;Increase Strenth and Stamina Increase Physical Activity;Increase Strenth and Stamina      Comments Brett Blake has been doing well in rehab.  He is up to level 4 on NuStep and level 5 on the recumbent bike.  We will continue to monitor his progression. Brett Blake is progressing well and meeting HR and RPE goals      Expected Outcomes Short: Try to increase treadmill.  Long: Continue to exercise at home. Short - Brett Blake will continue to attend regularly.  Long - Brett Blake will exercise on hos  own outside class.         Discharge Exercise Prescription (Final Exercise Prescription Changes):     Exercise Prescription Changes - 07/07/17 1200      Response to Exercise   Blood Pressure (Admit) 110/62   Blood Pressure (Exercise) 150/90   Blood Pressure (Exit) 110/64   Heart Rate (Admit) 67 bpm   Heart Rate (Exercise) 134 bpm    Heart Rate (Exit) 98 bpm   Rating of Perceived Exertion (Exercise) 13   Symptoms none   Duration Continue with 45 min of aerobic exercise without signs/symptoms of physical distress.   Intensity THRR unchanged     Progression   Progression Continue to progress workloads to maintain intensity without signs/symptoms of physical distress.   Average METs 3.2     Resistance Training   Training Prescription Yes   Weight 5 lb   Reps 10-15     Interval Training   Interval Training No     Treadmill   MPH 2.5   Grade 1.5   Minutes 15   METs 3.43     Recumbant Bike   Level 5   Watts 30   Minutes 15   METs 2.97      Nutrition:  Target Goals: Understanding of nutrition guidelines, daily intake of sodium '1500mg'$ , cholesterol '200mg'$ , calories 30% from fat and 7% or less from saturated fats, daily to have 5 or more servings of fruits and vegetables.  Biometrics:     Pre Biometrics - 05/08/17 1338      Pre Biometrics   Height 5' 5.2" (1.656 m)   Weight 196 lb 8 oz (89.1 kg)   Waist Circumference 43.5 inches   Hip Circumference 40.5 inches   Waist to Hip Ratio 1.07 %   BMI (Calculated) 32.6   Single Leg Stand 12.17 seconds       Nutrition Therapy Plan and Nutrition Goals:     Nutrition Therapy & Goals - 06/07/17 1705      Nutrition Therapy   RD appointment defered Yes      Nutrition Discharge: Rate Your Plate Scores:     Nutrition Assessments - 05/08/17 1237      MEDFICTS Scores   Pre Score 36      Nutrition Goals Re-Evaluation:     Nutrition Goals Re-Evaluation    [place] Name 06/07/17 1705             Goals   Current Weight 202 lb (91.6 kg)       Nutrition Goal "I know what to eat. I have had those nutrition classes before"       Comment Gurkaran "Brett Blake" declined my offer again today to see our Cardiac Rehab REgistered dieticain.        Expected Outcome Heart healthy eating          Nutrition Goals Discharge (Final Nutrition Goals Re-Evaluation):      Nutrition Goals Re-Evaluation - 06/07/17 1705      Goals   Current Weight 202 lb (91.6 kg)   Nutrition Goal "I know what to eat. I have had those nutrition classes before"   Comment Russell "Brett Blake" declined my offer again today to see our Cardiac Rehab REgistered dieticain.    Expected Outcome Heart healthy eating      Psychosocial: Target Goals: Acknowledge presence or absence of significant depression and/or stress, maximize coping skills, provide positive support system. Participant is able to verbalize types and ability to use techniques and skills needed for  reducing stress and depression.   Initial Review & Psychosocial Screening:     Initial Psych Review & Screening - 05/08/17 1240      Initial Review   Current issues with Current Anxiety/Panic;Current Sleep Concerns  Anxiety when he is short of breath     Family Dynamics   Good Support System? Yes  Brett Blake, wife     Barriers   Psychosocial barriers to participate in program There are no identifiable barriers or psychosocial needs.     Screening Interventions   Interventions Encouraged to exercise;Provide feedback about the scores to participant;Program counselor consult      Quality of Life Scores:      Quality of Life - 05/08/17 1242      Quality of Life Scores   Health/Function Pre 14.7 %   Socioeconomic Pre 23.25 %   Psych/Spiritual Pre 27.93 %   Family Pre 18 %   GLOBAL Pre 19.58 %      PHQ-9: Recent Review Flowsheet Data    Depression screen [facility] 2/9 05/08/2017   Decreased Interest 1   Down, Depressed, Hopeless 0   PHQ - 2 Score 1   Altered sleeping 2    Tired, decreased energy 2   Change in appetite 3    Feeling bad or failure about yourself  0   Trouble concentrating 1   Moving slowly or fidgety/restless 0   Suicidal thoughts 0   PHQ-9 Score 9   Difficult doing work/chores Somewhat difficult      Interpretation of Total Score  Total Score Depression Severity:  1-4 = Minimal  depression, 5-9 = Mild depression, 10-14 = Moderate depression, 15-19 = Moderately severe depression, 20-27 = Severe depression   Psychosocial Evaluation and Intervention:     Psychosocial Evaluation - 05/15/17 1735      Psychosocial Evaluation & Interventions   Interventions Encouraged to exercise with the program and follow exercise prescription;Stress management education;Relaxation education   Comments Counselor met with Brett Blake) today for initial psychosocial evaluation.  He is a 76 year old who struggles with angina and was recommended to this program by his Cardiologist.  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  Brett Blake has multiple health issues with diabetes, asthma, COPD, Sleep Apnea in addition to his heart diagnosis.  He reports sleeping well lately but not well mostly which has been a chronic problem - even with the use occasionally of a CPAP.  His appetite has decreased significantly lately and he attributes that to possibly one of his medications.  He has a history of depression and some current symptoms of anxiety for which he has medication that helps.  He reports his mood is typically positive.  Brett Blake has multiple stressors in his life with his health, his wife's health with back problems, and he is continuing to remodel his home - which he reports is a "2 year project!".  Brett Blake has goals to lose more weight (he has lost 14 pounds this year so far) and increase his stamina and strength.  Staff will follow with Brett Blake throughout the course of this program.     Expected Outcomes Brett Blake will benefit from consistent exercise to achieve his stated goals.  He will also benefit from the psychoeducational components of this program to help with his anxiety symptoms and learn positive ways to cope with stress.  Staff will follow with Brett Blake.      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 " I encounraged him when he went to exercise to get his exercise in and he said he could talk to someone while working out on the treadmilll like he did today.  Brett Blake told us how he took his wife out where they went on their first date years ago. 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 " I encounraged him when he went to exercise to get his exercise in and he said he could talk to someone while working out on the treadmilll like he did today.  Brett Blake told us how he took his wife out where they went on their first date years ago. His wife is a good support system for him    Interventions Encouraged to attend Cardiac Rehabilitation for the exercise      Vocational Rehabilitation: Provide vocational rehab assistance to qualifying candidates.   Vocational Rehab Evaluation & Intervention:     Vocational Rehab - 05/08/17 1232      Initial Vocational Rehab Evaluation & Intervention   Assessment shows need for Vocational  Rehabilitation No      Education: Education Goals: Education classes will be provided on a weekly basis, covering required topics. Participant will state understanding/return demonstration of topics presented.  Learning Barriers/Preferences:     Learning Barriers/Preferences - 05/08/17 1231      Learning Barriers/Preferences   Learning Barriers Hearing   Learning Preferences Individual Instruction      Education Topics: General Nutrition Guidelines/Fats and Fiber: -Group instruction provided by verbal, written material, models and posters to present the general guidelines for heart healthy nutrition. Gives an explanation and review of dietary fats and fiber.   Cardiac Rehab from 07/10/2017 in [facility] Cardiac and Pulmonary Rehab  Date  06/19/17  Educator  PI  Instruction Review Code  2- meets goals/outcomes      Controlling Sodium/Reading Food Labels: -Group verbal and written material supporting the discussion of sodium use in heart healthy nutrition. Review and explanation with models, verbal and written materials for utilization of the food label.   Cardiac Rehab from 07/10/2017 in [facility] Cardiac and Pulmonary Rehab  Date  06/26/17  Educator  PI  Instruction Review Code  2- meets goals/outcomes      Exercise Physiology & Risk Factors: -  Group verbal and written instruction with models to review the exercise physiology of the cardiovascular system and associated critical values. Details cardiovascular disease risk factors and the goals associated with each risk factor.   Cardiac Rehab from 07/10/2017 in [facility] Cardiac and Pulmonary Rehab  Date  07/03/17  Educator  [facility]  Instruction Review Code  2- meets goals/outcomes      Aerobic Exercise & Resistance Training: - Gives group verbal and written discussion on the health impact of inactivity. On the components of aerobic and resistive training programs and the benefits of this training and how to safely progress through these  programs.   Cardiac Rehab from 07/10/2017 in [facility] Cardiac and Pulmonary Rehab  Date  07/05/17  Educator  AS  Instruction Review Code  2- meets goals/outcomes      Flexibility, Balance, General Exercise Guidelines: - Provides group verbal and written instruction on the benefits of flexibility and balance training programs. Provides general exercise guidelines with specific guidelines to those with heart or lung disease. Demonstration and skill practice provided.   Cardiac Rehab from 07/10/2017 in [facility] Cardiac and Pulmonary Rehab  Date  07/10/17  Educator  AS  Instruction Review Code  2- meets goals/outcomes      Stress Management: - Provides group verbal and written instruction about the health risks of elevated stress, cause of high stress, and healthy ways to reduce stress.   Depression: - Provides group verbal and written instruction on the correlation between heart/lung disease and depressed mood, treatment options, and the stigmas associated with seeking treatment.   Cardiac Rehab from 07/10/2017 in [facility] Cardiac and Pulmonary Rehab  Date  06/21/17  Educator  Lucianne Lei, MSW  Instruction Review Code  2- meets goals/outcomes      Anatomy & Physiology of the Heart: - Group verbal and written instruction and models provide basic cardiac anatomy and physiology, with the coronary electrical and arterial systems. Review of: AMI, Angina, Valve disease, Heart Failure, Cardiac Arrhythmia, Pacemakers, and the ICD.   Cardiac Procedures: - Group verbal and written instruction and models to describe the testing methods done to diagnose heart disease. Reviews the outcomes of the test results. Describes the treatment choices: Medical Management, Angioplasty, or Coronary Bypass Surgery.   Cardiac Rehab from 07/10/2017 in [facility] Cardiac and Pulmonary Rehab  Date  05/29/17  Educator  SB  Instruction Review Code  2- meets goals/outcomes      Cardiac Medications: - Group verbal and written  instruction to review commonly prescribed medications for heart disease. Reviews the medication, class of the drug, and side effects. Includes the steps to properly store meds and maintain the prescription regimen.   Cardiac Rehab from 07/10/2017 in [facility] Cardiac and Pulmonary Rehab  Date  06/05/17 Marisue Humble 2]  Educator  SB  Instruction Review Code  2- meets goals/outcomes      Go Sex-Intimacy & Heart Disease, Get SMART - Goal Setting: - Group verbal and written instruction through game format to discuss heart disease and the return to sexual intimacy. Provides group verbal and written material to discuss and apply goal setting through the application of the S.M.A.R.T. Method.   Cardiac Rehab from 07/10/2017 in [facility] Cardiac and Pulmonary Rehab  Date  05/29/17  Educator  SB  Instruction Review Code  2- meets goals/outcomes      Other Matters of the Heart: - Provides group verbal, written materials and models to describe Heart Failure, Angina, Valve Disease, and Diabetes in the realm of heart disease.  Includes description of the disease process and treatment options available to the cardiac patient.   Exercise & Equipment Safety: - Individual verbal instruction and demonstration of equipment use and safety with use of the equipment.   Cardiac Rehab from 07/10/2017 in [facility] Cardiac and Pulmonary Rehab  Date  05/08/17  Educator  SB  Instruction Review Code  2- meets goals/outcomes      Infection Prevention: - Provides verbal and written material to individual with discussion of infection control including proper hand washing and proper equipment cleaning during exercise session.   Cardiac Rehab from 07/10/2017 in [facility] Cardiac and Pulmonary Rehab  Date  05/08/17  Educator  SB  Instruction Review Code  2- meets goals/outcomes      Falls Prevention: - Provides verbal and written material to individual with discussion of falls prevention and safety.   Cardiac Rehab from 07/10/2017 in [facility]  Cardiac and Pulmonary Rehab  Date  05/08/17  Educator  SB  Instruction Review Code  2- meets goals/outcomes      Diabetes: - Individual verbal and written instruction to review signs/symptoms of diabetes, desired ranges of glucose level fasting, after meals and with exercise. Advice that pre and post exercise glucose checks will be done for 3 sessions at entry of program.   Cardiac Rehab from 07/10/2017 in [facility] Cardiac and Pulmonary Rehab  Date  05/08/17  Educator  [facility]  Instruction Review Code  2- meets goals/outcomes       Knowledge Questionnaire Score:     Knowledge Questionnaire Score - 05/08/17 1231      Knowledge Questionnaire Score   Pre Score 24/28  Reviewed correct answers with Brett Blake, he acknowledged understanding      Core Components/Risk Factors/Patient Goals at Admission:     Personal Goals and Risk Factors at Admission - 05/08/17 1237      Core Components/Risk Factors/Patient Goals on Admission    Weight Management Yes;Obesity;Weight Loss   Intervention Weight Management/Obesity: Establish reasonable short term and long term weight goals.;Obesity: Provide education and appropriate resources to help participant work on and attain dietary goals.   Admit Weight 196 lb 8 oz (89.1 kg)   Goal Weight: Short Term 193 lb (87.5 kg)   Goal Weight: Long Term 170 lb (77.1 kg)   Expected Outcomes Short Term: Continue to assess and modify interventions until short term weight is achieved;Long Term: Adherence to nutrition and physical activity/exercise program aimed toward attainment of established weight goal;Weight Maintenance: Understanding of the daily nutrition guidelines, which includes 25-35% calories from fat, 7% or less cal from saturated fats, less than '200mg'$  cholesterol, less than 1.5gm of sodium, & 5 or more servings of fruits and vegetables daily;Weight Loss: Understanding of general recommendations for a balanced deficit meal plan, which promotes 1-2 lb weight loss per  week and includes a negative energy balance of [phone] kcal/d  Weighed 213 lb in December 2017    Diabetes Yes   Intervention Provide education about signs/symptoms and action to take for hypo/hyperglycemia.;Provide education about proper nutrition, including hydration, and aerobic/resistive exercise prescription along with prescribed medications to achieve blood glucose in normal ranges: Fasting glucose 65-99 mg/dL   Expected Outcomes Short Term: Participant verbalizes understanding of the signs/symptoms and immediate care of hyper/hypoglycemia, proper foot care and importance of medication, aerobic/resistive exercise and nutrition plan for blood glucose control.;Long Term: Attainment of HbA1C < 7%.   Hypertension Yes   Intervention Provide education on lifestyle modifcations including regular physical activity/exercise, weight management, moderate sodium  restriction and increased consumption of fresh fruit, vegetables, and low fat dairy, alcohol moderation, and smoking cessation.;Monitor prescription use compliance.   Expected Outcomes Short Term: Continued assessment and intervention until BP is < 140/82m HG in hypertensive participants. < 130/876mHG in hypertensive participants with diabetes, heart failure or chronic kidney disease.;Long Term: Maintenance of blood pressure at goal levels.   Lipids Yes   Intervention Provide education and support for participant on nutrition & aerobic/resistive exercise along with prescribed medications to achieve LDL '70mg'$ , HDL >'40mg'$ .   Expected Outcomes Short Term: Participant states understanding of desired cholesterol values and is compliant with medications prescribed. Participant is following exercise prescription and nutrition guidelines.;Long Term: Cholesterol controlled with medications as prescribed, with individualized exercise RX and with personalized nutrition plan. Value goals: LDL < '70mg'$ , HDL > 40 mg.      Core Components/Risk Factors/Patient Goals  Review:      Goals and Risk Factor Review    Row Name 06/07/17 1707 06/07/17 1711           Core Components/Risk Factors/Patient Goals Review   Personal Goals Review Weight Management/Obesity  -      Review 202lbs today "but i have been able to tighten my belt. My clothes feel better. I can come in here not feeling well but once I exercise I feel much better. I really believe this is helping me. I have Silver Sneakers so I am going to check about a gym in [place] "I know I need to cont this exercise program.  DaShanon [place] that he used to have to check his blood sugars but his is on Glipiride and that keeps his blood sugars under control.       Expected Outcomes Heart healthy lifestyle. Try to lose more weight.          Core Components/Risk Factors/Patient Goals at Discharge (Final Review):      Goals and Risk Factor Review - 06/07/17 1711      Core Components/Risk Factors/Patient Goals Review   Review Da[place]anon [place] that he used to have to check his blood sugars but his is on Glipiride and that keeps his blood sugars under control.    Expected Outcomes Try to lose more weight.       ITP Comments:     ITP Comments    Row Name 05/08/17 1235 05/17/17 0927 06/14/17 0610 07/12/17 0700     ITP Comments Medical review completed. Initial ITP created. Documentation of diagnosis can be found in Care Everywhere 04/26/17 Office note.  30 day review. Continue with ITP unless directed changes per Medical Director review  New to program 30 day review. Continue with ITP unless directed changes per Medical Director review 30 day review. Continue with ITP unless directed changes per Medical Director review          Comments:

## 2017-07-13 VITALS — Ht 65.2 in | Wt 200.0 lb

## 2017-07-13 DIAGNOSIS — I208 Other forms of angina pectoris: Secondary | ICD-10-CM

## 2017-07-13 NOTE — Progress Notes (Signed)
Daily Session Note  Patient Details  Name: [name] MRN: [id number] Date of Birth: Feb 02, 1941 Referring Provider:     Cardiac Rehab from 05/08/2017 in [facility] Cardiac and Pulmonary Rehab  Referring Provider  Emily Filbert MD      Encounter Date: 07/13/2017  Check In:     Session Check In - 07/13/17 1635      Check-In   Location [facility]-Cardiac & Pulmonary Rehab   Staff Present Earlean Shawl, BS, ACSM CEP, Exercise Physiologist;Joseph Alcus Dad, RN BSN   Supervising physician immediately available to respond to emergencies See telemetry face sheet for immediately available ER MD   Medication changes reported     No   Fall or balance concerns reported    No   Tobacco Cessation No Change   Warm-up and Cool-down Performed on first and last piece of equipment   Resistance Training Performed Yes   VAD Patient? No     Pain Assessment   Currently in Pain? No/denies   Multiple Pain Sites No         History  Smoking Status  . Former Smoker  . Packs/day: 1.00  . Years: 15.00  . Types: Cigarettes  Smokeless Tobacco  . Never Used    Comment: quit 1977    Goals Met:  Independence with exercise equipment Exercise tolerated well No report of cardiac concerns or symptoms Strength training completed today  Goals Unmet:  Not Applicable  Comments:      6 Minute Walk    Row Name 05/08/17 1334 07/13/17 1646       6 Minute Walk   Phase Initial Discharge    Distance 1600 feet 1712 feet    Distance % Change  - 7 %  112 ft increase    Walk Time 6 minutes 6 minutes    # of Rest Breaks 0 0    MPH 3.03 3.24    METS 2.97 3.3    RPE 9 10    Perceived Dyspnea  1  -    VO2 Peak 10.4 11.52    Symptoms Yes (comment) No    Comments SOB  -    Resting HR 74 bpm 92 bpm    Resting BP 132/66 120/70    Max Ex. HR 107 bpm 110 bpm    Max Ex. BP 132/64 148/70    2 Minute Post BP 126/60  -     Pt able to follow exercise prescription today without complaint.   Will continue to monitor for progression.   Dr. Emily Filbert is Medical Director for [place] and [facility].

## 2017-07-17 DIAGNOSIS — I208 Other forms of angina pectoris: Secondary | ICD-10-CM | POA: Diagnosis not present

## 2017-07-17 NOTE — Progress Notes (Signed)
Daily Session Note  Patient Details  Name: Brett Blake MRN: [id number] Date of Birth: [birth year] Referring Provider:     Cardiac Rehab from 05/08/2017 in [facility] Cardiac and Pulmonary Rehab  Referring Provider  Emily Filbert MD      Encounter Date: 07/17/2017  Check In:     Session Check In - 07/17/17 1718      Check-In   Location [facility]-Cardiac & Pulmonary Rehab   Staff Present Nada Maclachlan, BA, ACSM CEP, Exercise Physiologist;Kelly Amedeo Plenty, BS, ACSM CEP, Exercise Physiologist;Meredith Sherryll Burger, RN BSN   Supervising physician immediately available to respond to emergencies See telemetry face sheet for immediately available ER MD   Medication changes reported     No   Fall or balance concerns reported    No   Warm-up and Cool-down Performed on first and last piece of equipment   Resistance Training Performed Yes   VAD Patient? No     Pain Assessment   Currently in Pain? No/denies         History  Smoking Status  . Former Smoker  . Packs/day: 1.00  . Years: 15.00  . Types: Cigarettes  Smokeless Tobacco  . Never Used    Comment: quit 1977    Goals Met:  Independence with exercise equipment Exercise tolerated well No report of cardiac concerns or symptoms Strength training completed today  Goals Unmet:  Not Applicable  Comments:  Brett Blake graduated today from cardiac rehab with 36 sessions completed.  Details of the patient's exercise prescription and what he needs to do in order to continue the prescription and progress were discussed with patient.  Patient was given a copy of prescription and goals.  Patient verbalized understanding.  Brett Blake plans to continue to exercise by going to the [facility].    Dr. Emily Filbert is Medical Director for [facility] and [facility].

## 2017-07-17 NOTE — Patient Instructions (Signed)
Discharge Instructions  Patient Details  Name: Brett Blake MRN: [id number] Date of Birth: 1941-08-09 Referring Provider:  [name], MD   Number of Visits: 36  Reason for Discharge:  Patient reached a stable level of exercise. Patient independent in their exercise.  Smoking History:  History  Smoking Status  . Former Smoker  . Packs/day: 1.00  . Years: 15.00  . Types: Cigarettes  Smokeless Tobacco  . Never Used    Comment: quit 1977    Diagnosis:  Stable angina (Derma)  Initial Exercise Prescription:     Initial Exercise Prescription - 05/08/17 1300      Date of Initial Exercise RX and Referring Provider   Date 05/08/17   Referring Provider Emily Filbert MD     Treadmill   MPH 2.5   Grade 0.5   Minutes 15   METs 3.09     Recumbant Bike   Level 2   RPM 25   Watts 28   Minutes 15   METs 2.87     NuStep   Level 2   SPM 80   Minutes 15   METs 2     Prescription Details   Frequency (times per week) 3   Duration Progress to 45 minutes of aerobic exercise without signs/symptoms of physical distress     Intensity   THRR 40-80% of Max Heartrate 109-134   Ratings of Perceived Exertion 11-13   Perceived Dyspnea 0-4     Progression   Progression Continue to progress workloads to maintain intensity without signs/symptoms of physical distress.     Resistance Training   Training Prescription Yes   Weight 3 lbs   Reps 10-15      Discharge Exercise Prescription (Final Exercise Prescription Changes):     Exercise Prescription Changes - 07/07/17 1200      Response to Exercise   Blood Pressure (Admit) 110/62   Blood Pressure (Exercise) 150/90   Blood Pressure (Exit) 110/64   Heart Rate (Admit) 67 bpm   Heart Rate (Exercise) 134 bpm   Heart Rate (Exit) 98 bpm   Rating of Perceived Exertion (Exercise) 13   Symptoms none   Duration Continue with 45 min of aerobic exercise without signs/symptoms of physical distress.   Intensity THRR unchanged      Progression   Progression Continue to progress workloads to maintain intensity without signs/symptoms of physical distress.   Average METs 3.2     Resistance Training   Training Prescription Yes   Weight 5 lb   Reps 10-15     Interval Training   Interval Training No     Treadmill   MPH 2.5   Grade 1.5   Minutes 15   METs 3.43     Recumbant Bike   Level 5   Watts 30   Minutes 15   METs 2.97      Functional Capacity:     6 Minute Walk    Row Name 05/08/17 1334 07/13/17 1646       6 Minute Walk   Phase Initial Discharge    Distance 1600 feet 1712 feet    Distance % Change  - 7 %  112 ft increase    Walk Time 6 minutes 6 minutes    # of Rest Breaks 0 0    MPH 3.03 3.24    METS 2.97 3.3    RPE 9 10    Perceived Dyspnea  1  -    VO2 Peak  10.4 11.52    Symptoms Yes (comment) No    Comments SOB  -    Resting HR 74 bpm 92 bpm    Resting BP 132/66 120/70    Max Ex. HR 107 bpm 110 bpm    Max Ex. BP 132/64 148/70    2 Minute Post BP 126/60  -       Quality of Life:     Quality of Life - 05/08/17 1242      Quality of Life Scores   Health/Function Pre 14.7 %   Socioeconomic Pre 23.25 %   Psych/Spiritual Pre 27.93 %   Family Pre 18 %   GLOBAL Pre 19.58 %      Personal Goals: Goals established at orientation with interventions provided to work toward goal.     Personal Goals and Risk Factors at Admission - 05/08/17 1237      Core Components/Risk Factors/Patient Goals on Admission    Weight Management Yes;Obesity;Weight Loss   Intervention Weight Management/Obesity: Establish reasonable short term and long term weight goals.;Obesity: Provide education and appropriate resources to help participant work on and attain dietary goals.   Admit Weight 196 lb 8 oz (89.1 kg)   Goal Weight: Short Term 193 lb (87.5 kg)   Goal Weight: Long Term 170 lb (77.1 kg)   Expected Outcomes Short Term: Continue to assess and modify interventions until short term weight is  achieved;Long Term: Adherence to nutrition and physical activity/exercise program aimed toward attainment of established weight goal;Weight Maintenance: Understanding of the daily nutrition guidelines, which includes 25-35% calories from fat, 7% or less cal from saturated fats, less than 200mg  cholesterol, less than 1.5gm of sodium, & 5 or more servings of fruits and vegetables daily;Weight Loss: Understanding of general recommendations for a balanced deficit meal plan, which promotes 1-2 lb weight loss per week and includes a negative energy balance of [phone] kcal/d  Weighed 213 lb in December 2017    Diabetes Yes   Intervention Provide education about signs/symptoms and action to take for hypo/hyperglycemia.;Provide education about proper nutrition, including hydration, and aerobic/resistive exercise prescription along with prescribed medications to achieve blood glucose in normal ranges: Fasting glucose 65-99 mg/dL   Expected Outcomes Short Term: Participant verbalizes understanding of the signs/symptoms and immediate care of hyper/hypoglycemia, proper foot care and importance of medication, aerobic/resistive exercise and nutrition plan for blood glucose control.;Long Term: Attainment of HbA1C < 7%.   Hypertension Yes   Intervention Provide education on lifestyle modifcations including regular physical activity/exercise, weight management, moderate sodium restriction and increased consumption of fresh fruit, vegetables, and low fat dairy, alcohol moderation, and smoking cessation.;Monitor prescription use compliance.   Expected Outcomes Short Term: Continued assessment and intervention until BP is < 140/45mm HG in hypertensive participants. < 130/32mm HG in hypertensive participants with diabetes, heart failure or chronic kidney disease.;Long Term: Maintenance of blood pressure at goal levels.   Lipids Yes   Intervention Provide education and support for participant on nutrition & aerobic/resistive  exercise along with prescribed medications to achieve LDL 70mg , HDL >40mg .   Expected Outcomes Short Term: Participant states understanding of desired cholesterol values and is compliant with medications prescribed. Participant is following exercise prescription and nutrition guidelines.;Long Term: Cholesterol controlled with medications as prescribed, with individualized exercise RX and with personalized nutrition plan. Value goals: LDL < 70mg , HDL > 40 mg.       Personal Goals Discharge:     Goals and Risk Factor Review - 06/07/17 1711  Core Components/Risk Factors/Patient Goals Review   Review Brett Blake reports that he used to have to check his blood sugars but his is on Glipiride and that keeps his blood sugars under control.    Expected Outcomes Try to lose more weight.       Nutrition & Weight - Outcomes:     Pre Biometrics - 05/08/17 1338      Pre Biometrics   Height 5' 5.2" (1.656 m)   Weight 196 lb 8 oz (89.1 kg)   Waist Circumference 43.5 inches   Hip Circumference 40.5 inches   Waist to Hip Ratio 1.07 %   BMI (Calculated) 32.6   Single Leg Stand 12.17 seconds         Post Biometrics - 07/13/17 1650       Post  Biometrics   Height 5' 5.2" (1.656 m)   Weight 200 lb (90.7 kg)   Waist Circumference 43.5 inches   Hip Circumference 43 inches   Waist to Hip Ratio 1.01 %   BMI (Calculated) 33.1   Single Leg Stand 16.57 seconds      Nutrition:     Nutrition Therapy & Goals - 06/07/17 1705      Nutrition Therapy   RD appointment defered Yes      Nutrition Discharge:     Nutrition Assessments - 05/08/17 1237      MEDFICTS Scores   Pre Score 36      Education Questionnaire Score:     Knowledge Questionnaire Score - 05/08/17 1231      Knowledge Questionnaire Score   Pre Score 24/28  Reviewed correct answers with Brett Blake, he acknowledged understanding      Goals reviewed with patient; copy given to patient.

## 2017-07-19 DIAGNOSIS — I208 Other forms of angina pectoris: Secondary | ICD-10-CM

## 2017-07-19 NOTE — Progress Notes (Signed)
Cardiac Individual Treatment Plan  Patient Details  Name: Brett Blake MRN: [id number] Date of Birth: 01/14/1941 Referring Provider:     Cardiac Rehab from 05/08/2017 in [facility] Cardiac and Pulmonary Rehab  Referring Provider  Emily Filbert MD      Initial Encounter Date:    Cardiac Rehab from 05/08/2017 in [facility] Cardiac and Pulmonary Rehab  Date  05/08/17  Referring Provider  Emily Filbert MD      Visit Diagnosis: Stable angina [facility])  Patient's Home Medications on Admission:  Current Outpatient Prescriptions:  .  ALPRAZolam (XANAX) 0.5 MG tablet, Take 1 tablet (0.5 mg total) by mouth 3 (three) times daily as needed for sleep or anxiety., Disp: 15 tablet, Rfl: 0 .  amLODipine (NORVASC) 5 MG tablet, Take 5 mg by mouth daily., Disp: , Rfl: 3 .  aspirin 81 MG chewable tablet, Chew 81 mg by mouth daily., Disp: , Rfl:  .  azithromycin (ZITHROMAX) 250 MG tablet, Take 1 tablet (250 mg total) by mouth daily. (Patient not taking: Reported on 05/08/2017), Disp: 4 each, Rfl: 0 .  bisoprolol (ZEBETA) 5 MG tablet, Take 5 mg by mouth 2 (two) times daily., Disp: , Rfl:  .  esomeprazole (NEXIUM) 20 MG capsule, Take 20 mg by mouth daily., Disp: , Rfl:  .  fexofenadine (ALLEGRA) 180 MG tablet, Take 180 mg by mouth daily as needed for allergies or rhinitis., Disp: , Rfl:  .  fluticasone (FLONASE) 50 MCG/ACT nasal spray, Place 1 spray into both nostrils daily as needed for allergies or rhinitis., Disp: , Rfl:  .  glimepiride (AMARYL) 1 MG tablet, Take 1 mg by mouth daily. , Disp: , Rfl:  .  glucosamine-chondroitin 500-400 MG tablet, Take 1 tablet by mouth daily., Disp: , Rfl:  .  ibuprofen (ADVIL,MOTRIN) 200 MG tablet, Take 200 mg by mouth every 6 (six) hours as needed., Disp: , Rfl:  .  metoprolol succinate (TOPROL-XL) 25 MG 24 hr tablet, Take 25 mg by mouth daily., Disp: , Rfl:  .  montelukast (SINGULAIR) 10 MG tablet, Take 10 mg by mouth at bedtime., Disp: , Rfl:  .  Multiple Vitamins-Minerals  (MULTIVITAMIN PO), Take 1 tablet by mouth daily., Disp: , Rfl:  .  nitroGLYCERIN (NITROSTAT) 0.4 MG SL tablet, Place 0.4 mg under the tongue every 5 (five) minutes as needed for chest pain., Disp: , Rfl:  .  ranitidine (ZANTAC) 150 MG tablet, Take 150 mg by mouth at bedtime as needed for heartburn., Disp: , Rfl:  .  simvastatin (ZOCOR) 20 MG tablet, Take 20 mg by mouth daily., Disp: , Rfl:  .  triamterene-hydrochlorothiazide (DYAZIDE) 37.5-25 MG per capsule, Take 1 capsule by mouth daily., Disp: , Rfl:   Past Medical History: Past Medical History:  Diagnosis Date  . Anxiety   . Arthritis   . Asthma   . Colon polyps   . COPD (chronic obstructive pulmonary disease) ([place])   . Depression   . Diabetes ([place])   . GERD (gastroesophageal reflux disease)   . High cholesterol   . Hyperlipidemia   . Hypertension   . Sleep apnea     Tobacco Use: History  Smoking Status  . Former Smoker  . Packs/day: 1.00  . Years: 15.00  . Types: Cigarettes  Smokeless Tobacco  . Never Used    Comment: quit 1977    Labs: Recent Review Flowsheet Data    Labs for ITP Cardiac and Pulmonary Rehab Latest Ref Rng & Units 06/26/2015 06/27/2015  Hemoglobin A1c 4.0 - 6.0 % 5.9 6.1(H)       Exercise Target Goals:    Exercise Program Goal: Individual exercise prescription set with THRR, safety & activity barriers. Participant demonstrates ability to understand and report RPE using BORG scale, to self-measure pulse accurately, and to acknowledge the importance of the exercise prescription.  Exercise Prescription Goal: Starting with aerobic activity 30 plus minutes a day, 3 days per week for initial exercise prescription. Provide home exercise prescription and guidelines that participant acknowledges understanding prior to discharge.  Activity Barriers & Risk Stratification:     Activity Barriers & Cardiac Risk Stratification - 05/08/17 1245      Activity Barriers & Cardiac Risk Stratification   Activity  Barriers Shortness of Breath;Chest Pain/Angina;Joint Problems;Deconditioning;Muscular Weakness;Balance Concerns  Possibly combination of COPD and angina, R shoulder pain    Cardiac Risk Stratification High      6 Minute Walk:     6 Minute Walk    Row Name 05/08/17 1334 07/13/17 1646       6 Minute Walk   Phase Initial Discharge    Distance 1600 feet 1712 feet    Distance % Change  - 7 %  112 ft increase    Walk Time 6 minutes 6 minutes    # of Rest Breaks 0 0    MPH 3.03 3.24    METS 2.97 3.3    RPE 9 10    Perceived Dyspnea  1  -    VO2 Peak 10.4 11.52    Symptoms Yes (comment) No    Comments SOB  -    Resting HR 74 bpm 92 bpm    Resting BP 132/66 120/70    Max Ex. HR 107 bpm 110 bpm    Max Ex. BP 132/64 148/70    2 Minute Post BP 126/60  -       Oxygen Initial Assessment:   Oxygen Re-Evaluation:   Oxygen Discharge (Final Oxygen Re-Evaluation):   Initial Exercise Prescription:     Initial Exercise Prescription - 05/08/17 1300      Date of Initial Exercise RX and Referring Provider   Date 05/08/17   Referring Provider Emily Filbert MD     Treadmill   MPH 2.5   Grade 0.5   Minutes 15   METs 3.09     Recumbant Bike   Level 2   RPM 25   Watts 28   Minutes 15   METs 2.87     NuStep   Level 2   SPM 80   Minutes 15   METs 2     Prescription Details   Frequency (times per week) 3   Duration Progress to 45 minutes of aerobic exercise without signs/symptoms of physical distress     Intensity   THRR 40-80% of Max Heartrate 109-134   Ratings of Perceived Exertion 11-13   Perceived Dyspnea 0-4     Progression   Progression Continue to progress workloads to maintain intensity without signs/symptoms of physical distress.     Resistance Training   Training Prescription Yes   Weight 3 lbs   Reps 10-15      Perform Capillary Blood Glucose checks as needed.  Exercise Prescription Changes:     Exercise Prescription Changes    Row Name  05/08/17 1200 05/25/17 1100 06/08/17 1300 06/12/17 1700 06/21/17 1400     Response to Exercise   Blood Pressure (Admit) 132/66  - 128/78  - 152/80  Blood Pressure (Exercise) 132/64 146/68 142/76  - 160/80   Blood Pressure (Exit) 126/60 130/80 114/63  - 120/70   Heart Rate (Admit) 85 bpm 109 bpm 109 bpm  - 93 bpm   Heart Rate (Exercise) 107 bpm 126 bpm 115 bpm  - 151 bpm   Heart Rate (Exit) 92 bpm 97 bpm 103 bpm  - 87 bpm   Oxygen Saturation (Admit) 97 %  -  -  -  -   Oxygen Saturation (Exercise) 98 %  -  -  -  -   Rating of Perceived Exertion (Exercise) '9 15 13  '$ - 13   Symptoms SOB  -  -  - none   Comments walk test results  -  -  -  -   Duration  - Continue with 45 min of aerobic exercise without signs/symptoms of physical distress. Continue with 45 min of aerobic exercise without signs/symptoms of physical distress. Continue with 45 min of aerobic exercise without signs/symptoms of physical distress. Continue with 45 min of aerobic exercise without signs/symptoms of physical distress.   Intensity  - THRR unchanged THRR unchanged THRR unchanged THRR unchanged     Progression   Progression  -  -  -  - Continue to progress workloads to maintain intensity without signs/symptoms of physical distress.   Average METs  -  -  -  - 3.43     Resistance Training   Training Prescription  - Yes Yes Yes Yes   Weight  - '3 5 5 '$ 5lbs   Reps  - 10-15 10-15 10-15 10-15     Interval Training   Interval Training  -  -  -  - No     Treadmill   MPH  - 2.5 2.5 2.5 2.5   Grade  - 0.5 0.5 0.5 1.5   Minutes  - '15 15 15 15   '$ METs  - 3.26 3.26 3.26 3.43     Recumbant Bike   Level  - '2 3 3 5   '$ Watts  - '28 19 19 28   '$ Minutes  - '15 15 15 15   '$ METs  - 2.97 2.7 2.7 3.05     NuStep   Level  - 2  -  - 4   SPM  - 80  -  -  -   Minutes  - 15  -  - 15   METs  - 3.3  -  - 3.8     Home Exercise Plan   Plans to continue exercise at  -  -  - [employer] (comment) Forensic scientist (comment)    Frequency  -  -  - Add 2 additional days to program exercise sessions. Add 2 additional days to program exercise sessions.   Initial Home Exercises Provided  -  -  - 06/12/17 06/12/17   Row Name 07/07/17 1200             Response to Exercise   Blood Pressure (Admit) 110/62       Blood Pressure (Exercise) 150/90       Blood Pressure (Exit) 110/64       Heart Rate (Admit) 67 bpm       Heart Rate (Exercise) 134 bpm       Heart Rate (Exit) 98 bpm       Rating of Perceived Exertion (Exercise) 13       Symptoms none       Duration Continue  with 45 min of aerobic exercise without signs/symptoms of physical distress.       Intensity THRR unchanged         Progression   Progression Continue to progress workloads to maintain intensity without signs/symptoms of physical distress.       Average METs 3.2         Resistance Training   Training Prescription Yes       Weight 5 lb       Reps 10-15         Interval Training   Interval Training No         Treadmill   MPH 2.5       Grade 1.5       Minutes 15       METs 3.43         Recumbant Bike   Level 5       Watts 30       Minutes 15       METs 2.97          Exercise Comments:     Exercise Comments    Row Name 05/25/17 1115 06/08/17 1313 06/12/17 1731 07/13/17 1651     Exercise Comments Shanon Brow has tolerated exercise well.  He has been out sick this week and is on antibiotics and pplans to return Monday. Rosana Hoes has tolerated exercise well in his first weeks.  Staff will monitor progression. Reviewed home exercise with pt today.  Pt plans to look into a community facility for exercise.  Reviewed THR, pulse, RPE, sign and symptoms, NTG use, and when to call 911 or MD.  Also discussed weather considerations and indoor options.  Pt voiced understanding. 6 minute walk test done and reviewed with patient       Exercise Goals and Review:     Exercise Goals    Row Name 05/08/17 1338             Exercise Goals   Increase  Physical Activity Yes       Intervention Provide advice, education, support and counseling about physical activity/exercise needs.;Develop an individualized exercise prescription for aerobic and resistive training based on initial evaluation findings, risk stratification, comorbidities and participant's personal goals.       Expected Outcomes Achievement of increased cardiorespiratory fitness and enhanced flexibility, muscular endurance and strength shown through measurements of functional capacity and personal statement of participant.       Increase Strength and Stamina Yes       Intervention Provide advice, education, support and counseling about physical activity/exercise needs.;Develop an individualized exercise prescription for aerobic and resistive training based on initial evaluation findings, risk stratification, comorbidities and participant's personal goals.       Expected Outcomes Achievement of increased cardiorespiratory fitness and enhanced flexibility, muscular endurance and strength shown through measurements of functional capacity and personal statement of participant.          Exercise Goals Re-Evaluation :     Exercise Goals Re-Evaluation    Row Name 06/21/17 1419 07/07/17 1247           Exercise Goal Re-Evaluation   Exercise Goals Review Increase Physical Activity;Increase Strenth and Stamina Increase Physical Activity;Increase Strenth and Stamina      Comments Shanon Brow has been doing well in rehab.  He is up to level 4 on NuStep and level 5 on the recumbent bike.  We will continue to monitor his progression. Shanon Brow is progressing well and meeting HR and RPE  goals      Expected Outcomes Short: Try to increase treadmill.  Long: Continue to exercise at home. Short - Shanon Brow will continue to attend regularly.  Long - Shanon Brow will exercise on hos own outside class.         Discharge Exercise Prescription (Final Exercise Prescription Changes):     Exercise Prescription Changes -  07/07/17 1200      Response to Exercise   Blood Pressure (Admit) 110/62   Blood Pressure (Exercise) 150/90   Blood Pressure (Exit) 110/64   Heart Rate (Admit) 67 bpm   Heart Rate (Exercise) 134 bpm   Heart Rate (Exit) 98 bpm   Rating of Perceived Exertion (Exercise) 13   Symptoms none   Duration Continue with 45 min of aerobic exercise without signs/symptoms of physical distress.   Intensity THRR unchanged     Progression   Progression Continue to progress workloads to maintain intensity without signs/symptoms of physical distress.   Average METs 3.2     Resistance Training   Training Prescription Yes   Weight 5 lb   Reps 10-15     Interval Training   Interval Training No     Treadmill   MPH 2.5   Grade 1.5   Minutes 15   METs 3.43     Recumbant Bike   Level 5   Watts 30   Minutes 15   METs 2.97      Nutrition:  Target Goals: Understanding of nutrition guidelines, daily intake of sodium '1500mg'$ , cholesterol '200mg'$ , calories 30% from fat and 7% or less from saturated fats, daily to have 5 or more servings of fruits and vegetables.  Biometrics:     Pre Biometrics - 05/08/17 1338      Pre Biometrics   Height 5' 5.2" (1.656 m)   Weight 196 lb 8 oz (89.1 kg)   Waist Circumference 43.5 inches   Hip Circumference 40.5 inches   Waist to Hip Ratio 1.07 %   BMI (Calculated) 32.6   Single Leg Stand 12.17 seconds         Post Biometrics - 07/13/17 1650       Post  Biometrics   Height 5' 5.2" (1.656 m)   Weight 200 lb (90.7 kg)   Waist Circumference 43.5 inches   Hip Circumference 43 inches   Waist to Hip Ratio 1.01 %   BMI (Calculated) 33.1   Single Leg Stand 16.57 seconds      Nutrition Therapy Plan and Nutrition Goals:     Nutrition Therapy & Goals - 06/07/17 1705      Nutrition Therapy   RD appointment defered Yes      Nutrition Discharge: Rate Your Plate Scores:     Nutrition Assessments - 05/08/17 1237      MEDFICTS Scores   Pre Score  36      Nutrition Goals Re-Evaluation:     Nutrition Goals Re-Evaluation    [place] Name 06/07/17 1705             Goals   Current Weight 202 lb (91.6 kg)       Nutrition Goal "I know what to eat. I have had those nutrition classes before"       Comment [place] "Shanon Brow" declined my offer again today to see our Cardiac Rehab REgistered dieticain.        Expected Outcome Heart healthy eating          Nutrition Goals Discharge (Final Nutrition Goals Re-Evaluation):  Nutrition Goals Re-Evaluation - 06/07/17 1705      Goals   Current Weight 202 lb (91.6 kg)   Nutrition Goal "I know what to eat. I have had those nutrition classes before"   Comment [place] "Shanon Brow" declined my offer again today to see our Cardiac Rehab REgistered dieticain.    Expected Outcome Heart healthy eating      Psychosocial: Target Goals: Acknowledge presence or absence of significant depression and/or stress, maximize coping skills, provide positive support system. Participant is able to verbalize types and ability to use techniques and skills needed for reducing stress and depression.   Initial Review & Psychosocial Screening:     Initial Psych Review & Screening - 05/08/17 1240      Initial Review   Current issues with Current Anxiety/Panic;Current Sleep Concerns  Anxiety when he is short of breath     Family Dynamics   Good Support System? Yes  Doristine Bosworth, wife     Barriers   Psychosocial barriers to participate in program There are no identifiable barriers or psychosocial needs.     Screening Interventions   Interventions Encouraged to exercise;Provide feedback about the scores to participant;Program counselor consult      Quality of Life Scores:      Quality of Life - 05/08/17 1242      Quality of Life Scores   Health/Function Pre 14.7 %   Socioeconomic Pre 23.25 %   Psych/Spiritual Pre 27.93 %   Family Pre 18 %   GLOBAL Pre 19.58 %      PHQ-9: Recent Review Flowsheet Data     Depression screen [facility] 2/9 05/08/2017   Decreased Interest 1   Down, Depressed, Hopeless 0   PHQ - 2 Score 1   Altered sleeping 2    Tired, decreased energy 2   Change in appetite 3    Feeling bad or failure about yourself  0   Trouble concentrating 1   Moving slowly or fidgety/restless 0   Suicidal thoughts 0   PHQ-9 Score 9   Difficult doing work/chores Somewhat difficult      Interpretation of Total Score  Total Score Depression Severity:  1-4 = Minimal depression, 5-9 = Mild depression, 10-14 = Moderate depression, 15-19 = Moderately severe depression, 20-27 = Severe depression   Psychosocial Evaluation and Intervention:     Psychosocial Evaluation - 05/15/17 1735      Psychosocial Evaluation & Interventions   Interventions Encouraged to exercise with the program and follow exercise prescription;Stress management education;Relaxation education   Comments Counselor met with Mr. Rollo Shanon Brow) today for initial psychosocial evaluation.  He is a 76 year old who struggles with angina and was recommended to this program by his Cardiologist.  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  Shanon Brow has multiple health issues with diabetes, asthma, COPD, Sleep Apnea in addition to his heart diagnosis.  He reports sleeping well lately but not well mostly which has been a chronic problem - even with the use occasionally of a CPAP.  His appetite has decreased significantly lately and he attributes that to possibly one of his medications.  He has a history of depression and some current symptoms of anxiety for which he has medication that helps.  He reports his mood is typically positive.  Shanon Brow has multiple stressors in his life with his health, his wife's  health with back problems, and he is continuing to remodel his home - which he reports is a "2 year project!".  Shanon Brow has  goals to lose more weight (he has lost 14 pounds this year so far) and increase his stamina and strength.  Staff will follow with Shanon Brow throughout the course of this program.     Expected Outcomes Shanon Brow will benefit from consistent exercise to achieve his stated goals.  He will also benefit from the psychoeducational components of this program to help with his anxiety symptoms and learn positive ways to cope with stress.  Staff will follow with Shanon Brow.      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 " I encounraged him when he went to exercise to get his exercise in and he said he could talk to someone while working out on the treadmilll like he did today.  Shanon Brow told us how he took his wife out where they went on their first date years ago. 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 " I encounraged him when he went to exercise to get his exercise in and he said he could talk to someone while working out on the treadmilll like he did today.  Shanon Brow told us how he  took his wife out where they went on their first date years ago. His wife is a good support system for him    Interventions Encouraged to attend Cardiac Rehabilitation for the exercise      Vocational Rehabilitation: Provide vocational rehab assistance to qualifying candidates.   Vocational Rehab Evaluation & Intervention:     Vocational Rehab - 05/08/17 1232      Initial Vocational Rehab Evaluation & Intervention   Assessment shows need for Vocational Rehabilitation No      Education: Education Goals: Education classes will be provided on a weekly basis, covering required topics. Participant will state understanding/return demonstration of topics presented.  Learning Barriers/Preferences:     Learning Barriers/Preferences - 05/08/17 1231      Learning Barriers/Preferences   Learning Barriers Hearing   Learning Preferences Individual Instruction      Education Topics: General Nutrition Guidelines/Fats and Fiber: -Group instruction provided by verbal, written material, models and posters to present the general guidelines for heart healthy nutrition. Gives an explanation and review of dietary fats and fiber.   Cardiac Rehab from 07/17/2017  in [facility] Cardiac and Pulmonary Rehab  Date  06/19/17  Educator  PI  Instruction Review Code  2- meets goals/outcomes      Controlling Sodium/Reading Food Labels: -Group verbal and written material supporting the discussion of sodium use in heart healthy nutrition. Review and explanation with models, verbal and written materials for utilization of the food label.   Cardiac Rehab from 07/17/2017 in [facility] Cardiac and Pulmonary Rehab  Date  06/26/17  Educator  PI  Instruction Review Code  2- meets goals/outcomes      Exercise Physiology & Risk Factors: - Group verbal and written instruction with models to review the exercise physiology of the cardiovascular system and associated critical values. Details cardiovascular disease risk factors  and the goals associated with each risk factor.   Cardiac Rehab from 07/17/2017 in [facility] Cardiac and Pulmonary Rehab  Date  07/03/17  Educator  [facility]  Instruction Review Code  2- meets goals/outcomes      Aerobic Exercise & Resistance Training: - Gives group verbal and written discussion on the health impact of inactivity. On the components of aerobic and resistive training programs and the benefits of this training and how to safely progress through these programs.   Cardiac Rehab from 07/17/2017 in [facility] Cardiac and Pulmonary Rehab  Date  07/05/17  Educator  AS  Instruction Review Code  2- meets goals/outcomes      Flexibility, Balance, General Exercise Guidelines: - Provides group verbal and written instruction on the benefits of flexibility and balance training programs. Provides general exercise guidelines with specific guidelines to those with heart or lung disease. Demonstration and skill practice provided.   Cardiac Rehab from 07/17/2017 in [facility] Cardiac and Pulmonary Rehab  Date  07/10/17  Educator  AS  Instruction Review Code  2- meets goals/outcomes      Stress Management: - Provides group verbal and written instruction about the health risks of elevated stress, cause of high stress, and healthy ways to reduce stress.   Depression: - Provides group verbal and written instruction on the correlation between heart/lung disease and depressed mood, treatment options, and the stigmas associated with seeking treatment.   Cardiac Rehab from 07/17/2017 in [facility] Cardiac and Pulmonary Rehab  Date  06/21/17  Educator  Lucianne Lei, MSW  Instruction Review Code  2- meets goals/outcomes      Anatomy & Physiology of the Heart: - Group verbal and written instruction and models provide basic cardiac anatomy and physiology, with the coronary electrical and arterial systems. Review of: AMI, Angina, Valve disease, Heart Failure, Cardiac Arrhythmia, Pacemakers, and the ICD.   Cardiac Rehab from  07/17/2017 in [facility] Cardiac and Pulmonary Rehab  Date  07/17/17  Educator  [facility]  Instruction Review Code  2- meets goals/outcomes      Cardiac Procedures: - Group verbal and written instruction and models to describe the testing methods done to diagnose heart disease. Reviews the outcomes of the test results. Describes the treatment choices: Medical Management, Angioplasty, or Coronary Bypass Surgery.   Cardiac Rehab from 07/17/2017 in [facility] Cardiac and Pulmonary Rehab  Date  05/29/17  Educator  SB  Instruction Review Code  2- meets goals/outcomes      Cardiac Medications: - Group verbal and written instruction to review commonly prescribed medications for heart disease. Reviews the medication, class of the drug, and side effects. Includes the steps to properly store meds and maintain the prescription regimen.   Cardiac Rehab from 07/17/2017 in [facility] Cardiac and Pulmonary Rehab  Date  06/05/17 Marisue Humble 2]  Educator  SB  Instruction Review Code  2- meets goals/outcomes      Go Sex-Intimacy & Heart Disease, Get SMART - Goal Setting: - Group verbal and written instruction through game format to discuss heart disease and the return to sexual intimacy. Provides group verbal and written material to discuss and apply goal setting through the application of the S.M.A.R.T. Method.   Cardiac Rehab from 07/17/2017 in [facility] Cardiac and Pulmonary Rehab  Date  05/29/17  Educator  SB  Instruction Review Code  2- meets goals/outcomes      Other Matters of the Heart: - Provides group verbal, written materials and models to describe Heart Failure, Angina, Valve Disease, and Diabetes in the realm of heart disease. Includes description of the disease process and treatment options available to the cardiac patient.   Cardiac Rehab from 07/17/2017 in [facility] Cardiac and Pulmonary Rehab  Date  07/17/17  Educator  [facility]  Instruction Review Code  2- meets goals/outcomes      Exercise & Equipment Safety: - Individual  verbal instruction and demonstration of equipment use and safety with use of the equipment.   Cardiac Rehab from 07/17/2017 in [facility] Cardiac and Pulmonary Rehab  Date  05/08/17  Educator  SB  Instruction Review Code  2- meets goals/outcomes      Infection Prevention: - Provides verbal and written material to individual with discussion of infection control including proper hand washing and proper equipment cleaning during exercise session.   Cardiac Rehab from 07/17/2017 in [facility] Cardiac and Pulmonary Rehab  Date  05/08/17  Educator  SB  Instruction Review Code  2- meets goals/outcomes      Falls Prevention: - Provides verbal and written material to individual with discussion of falls prevention and safety.   Cardiac Rehab from 07/17/2017 in [facility] Cardiac and Pulmonary Rehab  Date  05/08/17  Educator  SB  Instruction Review Code  2- meets goals/outcomes      Diabetes: - Individual verbal and written instruction to review signs/symptoms of diabetes, desired ranges of glucose level fasting, after meals and with exercise. Advice that pre and post exercise glucose checks will be done for 3 sessions at entry of program.   Cardiac Rehab from 07/17/2017 in [facility] Cardiac and Pulmonary Rehab  Date  05/08/17  Educator  [facility]  Instruction Review Code  2- meets goals/outcomes       Knowledge Questionnaire Score:     Knowledge Questionnaire Score - 05/08/17 1231      Knowledge Questionnaire Score   Pre Score 24/28  Reviewed correct answers with Shanon Brow, he acknowledged understanding      Core Components/Risk Factors/Patient Goals at Admission:     Personal Goals and Risk Factors at Admission - 05/08/17 1237      Core Components/Risk Factors/Patient Goals on Admission    Weight Management Yes;Obesity;Weight Loss   Intervention Weight Management/Obesity: Establish reasonable short term and long term weight goals.;Obesity: Provide education and appropriate resources to help participant work  on and attain dietary goals.   Admit Weight 196 lb 8 oz (89.1 kg)   Goal Weight: Short Term 193 lb (87.5 kg)   Goal Weight: Long Term 170 lb (77.1 kg)   Expected Outcomes Short Term: Continue to assess and modify interventions until short term weight is achieved;Long Term: Adherence to nutrition and physical activity/exercise program aimed toward attainment of established weight goal;Weight Maintenance: Understanding of the daily nutrition guidelines, which includes 25-35% calories from fat, 7%  or less cal from saturated fats, less than '200mg'$  cholesterol, less than 1.5gm of sodium, & 5 or more servings of fruits and vegetables daily;Weight Loss: Understanding of general recommendations for a balanced deficit meal plan, which promotes 1-2 lb weight loss per week and includes a negative energy balance of [phone] kcal/d  Weighed 213 lb in December 2017    Diabetes Yes   Intervention Provide education about signs/symptoms and action to take for hypo/hyperglycemia.;Provide education about proper nutrition, including hydration, and aerobic/resistive exercise prescription along with prescribed medications to achieve blood glucose in normal ranges: Fasting glucose 65-99 mg/dL   Expected Outcomes Short Term: Participant verbalizes understanding of the signs/symptoms and immediate care of hyper/hypoglycemia, proper foot care and importance of medication, aerobic/resistive exercise and nutrition plan for blood glucose control.;Long Term: Attainment of HbA1C < 7%.   Hypertension Yes   Intervention Provide education on lifestyle modifcations including regular physical activity/exercise, weight management, moderate sodium restriction and increased consumption of fresh fruit, vegetables, and low fat dairy, alcohol moderation, and smoking cessation.;Monitor prescription use compliance.   Expected Outcomes Short Term: Continued assessment and intervention until BP is < 140/94m HG in hypertensive participants. < 130/853m HG in hypertensive participants with diabetes, heart failure or chronic kidney disease.;Long Term: Maintenance of blood pressure at goal levels.   Lipids Yes   Intervention Provide education and support for participant on nutrition & aerobic/resistive exercise along with prescribed medications to achieve LDL '70mg'$ , HDL >'40mg'$ .   Expected Outcomes Short Term: Participant states understanding of desired cholesterol values and is compliant with medications prescribed. Participant is following exercise prescription and nutrition guidelines.;Long Term: Cholesterol controlled with medications as prescribed, with individualized exercise RX and with personalized nutrition plan. Value goals: LDL < '70mg'$ , HDL > 40 mg.      Core Components/Risk Factors/Patient Goals Review:      Goals and Risk Factor Review    Row Name 06/07/17 1707 06/07/17 1711           Core Components/Risk Factors/Patient Goals Review   Personal Goals Review Weight Management/Obesity  -      Review 202lbs today "but i have been able to tighten my belt. My clothes feel better. I can come in here not feeling well but once I exercise I feel much better. I really believe this is helping me. I have Silver Sneakers so I am going [place] a gym in [place] "I know I need to cont this exercise program.  DaShanon Broweports that he used to have to check his blood sugars but his is on Glipiride and that keeps his blood sugars under control.       Expected Outcomes Heart healthy lifestyle. Try to lose more weight.          Core Components/Risk Factors/Patient Goals at Discharge (Final Review):      Goals and Risk Factor Review - 06/07/17 1711      Core Components/Risk Factors/Patient Goals Review   Review DaShanon Broweports that he used to have to check his blood sugars but his is on Glipiride and that keeps his blood sugars under control.    Expected Outcomes Try to lose more weight.       ITP Comments:     ITP Comments    Row Name  05/08/17 1235 05/17/17 0927 06/14/17 0610 07/12/17 0700     ITP Comments Medical review completed. Initial ITP created. Documentation of diagnosis can be found in Care Everywhere 04/26/17 Office note.  30 day  review. Continue with ITP unless directed changes per Medical Director review  New to program 30 day review. Continue with ITP unless directed changes per Medical Director review 30 day review. Continue with ITP unless directed changes per Medical Director review          Comments: Discharge ITP

## 2017-07-19 NOTE — Progress Notes (Signed)
Discharge Summary  Patient Details  Name: Brett Blake MRN: [id number] Date of Birth: 25-Mar-1941 Referring Provider:     Cardiac Rehab from 05/08/2017 in [facility] Cardiac and Pulmonary Rehab  Referring Provider  Emily Filbert MD       Number of Visits: 36  Reason for Discharge:  Patient reached a stable level of exercise. Patient independent in their exercise.  Smoking History:  History  Smoking Status  . Former Smoker  . Packs/day: 1.00  . Years: 15.00  . Types: Cigarettes  Smokeless Tobacco  . Never Used    Comment: quit 1977    Diagnosis:  Stable angina ([place])  ADL [facility]SD:   Initial Exercise Prescription:     Initial Exercise Prescription - 05/08/17 1300      Date of Initial Exercise RX and Referring Provider   Date 05/08/17   Referring Provider Emily Filbert MD     Treadmill   MPH 2.5   Grade 0.5   Minutes 15   METs 3.09     Recumbant Bike   Level 2   RPM 25   Watts 28   Minutes 15   METs 2.87     NuStep   Level 2   SPM 80   Minutes 15   METs 2     Prescription Details   Frequency (times per week) 3   Duration Progress to 45 minutes of aerobic exercise without signs/symptoms of physical distress     Intensity   THRR 40-80% of Max Heartrate 109-134   Ratings of Perceived Exertion 11-13   Perceived Dyspnea 0-4     Progression   Progression Continue to progress workloads to maintain intensity without signs/symptoms of physical distress.     Resistance Training   Training Prescription Yes   Weight 3 lbs   Reps 10-15      Discharge Exercise Prescription (Final Exercise Prescription Changes):     Exercise Prescription Changes - 07/07/17 1200      Response to Exercise   Blood Pressure (Admit) 110/62   Blood Pressure (Exercise) 150/90   Blood Pressure (Exit) 110/64   Heart Rate (Admit) 67 bpm   Heart Rate (Exercise) 134 bpm   Heart Rate (Exit) 98 bpm   Rating of Perceived Exertion (Exercise) 13   Symptoms none   Duration Continue  with 45 min of aerobic exercise without signs/symptoms of physical distress.   Intensity THRR unchanged     Progression   Progression Continue to progress workloads to maintain intensity without signs/symptoms of physical distress.   Average METs 3.2     Resistance Training   Training Prescription Yes   Weight 5 lb   Reps 10-15     Interval Training   Interval Training No     Treadmill   MPH 2.5   Grade 1.5   Minutes 15   METs 3.43     Recumbant Bike   Level 5   Watts 30   Minutes 15   METs 2.97      Functional Capacity:     6 Minute Walk    Row Name 05/08/17 1334 07/13/17 1646       6 Minute Walk   Phase Initial Discharge    Distance 1600 feet 1712 feet    Distance % Change  - 7 %  112 ft increase    Walk Time 6 minutes 6 minutes    # of Rest Breaks 0 0    MPH 3.03 3.24  METS 2.97 3.3    RPE 9 10    Perceived Dyspnea  1  -    VO2 Peak 10.4 11.52    Symptoms Yes (comment) No    Comments SOB  -    Resting HR 74 bpm 92 bpm    Resting BP 132/66 120/70    Max Ex. HR 107 bpm 110 bpm    Max Ex. BP 132/64 148/70    2 Minute Post BP 126/60  -       Psychological, QOL, Others - Outcomes: PHQ 2/9: Depression screen PHQ 2/9 05/08/2017  Decreased Interest 1  Down, Depressed, Hopeless 0  PHQ - 2 Score 1  Altered sleeping 2  Tired, decreased energy 2  Change in appetite 3  Feeling bad or failure about yourself  0  Trouble concentrating 1  Moving slowly or fidgety/restless 0  Suicidal thoughts 0  PHQ-9 Score 9  Difficult doing work/chores Somewhat difficult    Quality of Life:     Quality of Life - 05/08/17 1242      Quality of Life Scores   Health/Function Pre 14.7 %   Socioeconomic Pre 23.25 %   Psych/Spiritual Pre 27.93 %   Family Pre 18 %   GLOBAL Pre 19.58 %      Personal Goals: Goals established at orientation with interventions provided to work toward goal.     Personal Goals and Risk Factors at Admission - 05/08/17 1237       Core Components/Risk Factors/Patient Goals on Admission    Weight Management Yes;Obesity;Weight Loss   Intervention Weight Management/Obesity: Establish reasonable short term and long term weight goals.;Obesity: Provide education and appropriate resources to help participant work on and attain dietary goals.   Admit Weight 196 lb 8 oz (89.1 kg)   Goal Weight: Short Term 193 lb (87.5 kg)   Goal Weight: Long Term 170 lb (77.1 kg)   Expected Outcomes Short Term: Continue to assess and modify interventions until short term weight is achieved;Long Term: Adherence to nutrition and physical activity/exercise program aimed toward attainment of established weight goal;Weight Maintenance: Understanding of the daily nutrition guidelines, which includes 25-35% calories from fat, 7% or less cal from saturated fats, less than 200mg  cholesterol, less than 1.5gm of sodium, & 5 or more servings of fruits and vegetables daily;Weight Loss: Understanding of general recommendations for a balanced deficit meal plan, which promotes 1-2 lb weight loss per week and includes a negative energy balance of [phone] kcal/d  Weighed 213 lb in December 2017    Diabetes Yes   Intervention Provide education about signs/symptoms and action to take for hypo/hyperglycemia.;Provide education about proper nutrition, including hydration, and aerobic/resistive exercise prescription along with prescribed medications to achieve blood glucose in normal ranges: Fasting glucose 65-99 mg/dL   Expected Outcomes Short Term: Participant verbalizes understanding of the signs/symptoms and immediate care of hyper/hypoglycemia, proper foot care and importance of medication, aerobic/resistive exercise and nutrition plan for blood glucose control.;Long Term: Attainment of HbA1C < 7%.   Hypertension Yes   Intervention Provide education on lifestyle modifcations including regular physical activity/exercise, weight management, moderate sodium restriction and  increased consumption of fresh fruit, vegetables, and low fat dairy, alcohol moderation, and smoking cessation.;Monitor prescription use compliance.   Expected Outcomes Short Term: Continued assessment and intervention until BP is < 140/61mm HG in hypertensive participants. < 130/17mm HG in hypertensive participants with diabetes, heart failure or chronic kidney disease.;Long Term: Maintenance of blood pressure at goal levels.  Lipids Yes   Intervention Provide education and support for participant on nutrition & aerobic/resistive exercise along with prescribed medications to achieve LDL 70mg , HDL >40mg .   Expected Outcomes Short Term: Participant states understanding of desired cholesterol values and is compliant with medications prescribed. Participant is following exercise prescription and nutrition guidelines.;Long Term: Cholesterol controlled with medications as prescribed, with individualized exercise RX and with personalized nutrition plan. Value goals: LDL < 70mg , HDL > 40 mg.       Personal Goals Discharge:     Goals and Risk Factor Review    Row Name 06/07/17 1707 06/07/17 1711           Core Components/Risk Factors/Patient Goals Review   Personal Goals Review Weight Management/Obesity  -      Review 202lbs today "but i have been able to tighten my belt. My clothes feel better. I can come in here not feeling well but once I exercise I feel much better. I really believe this is helping me. I have Silver Sneakers so I am going to check about a gym in [place]." "I know I need to cont this exercise program.  Shanon Brow reports that he used to have to check his blood sugars but his is on Glipiride and that keeps his blood sugars under control.       Expected Outcomes Heart healthy lifestyle. Try to lose more weight.          Nutrition & Weight - Outcomes:     Pre Biometrics - 05/08/17 1338      Pre Biometrics   Height 5' 5.2" (1.656 m)   Weight 196 lb 8 oz (89.1 kg)   Waist  Circumference 43.5 inches   Hip Circumference 40.5 inches   Waist to Hip Ratio 1.07 %   BMI (Calculated) 32.6   Single Leg Stand 12.17 seconds         Post Biometrics - 07/13/17 1650       Post  Biometrics   Height 5' 5.2" (1.656 m)   Weight 200 lb (90.7 kg)   Waist Circumference 43.5 inches   Hip Circumference 43 inches   Waist to Hip Ratio 1.01 %   BMI (Calculated) 33.1   Single Leg Stand 16.57 seconds      Nutrition:     Nutrition Therapy & Goals - 06/07/17 1705      Nutrition Therapy   RD appointment defered Yes      Nutrition Discharge:     Nutrition Assessments - 05/08/17 1237      MEDFICTS Scores   Pre Score 36      Education Questionnaire Score:     Knowledge Questionnaire Score - 05/08/17 1231      Knowledge Questionnaire Score   Pre Score 24/28  Reviewed correct answers with Shanon Brow, he acknowledged understanding      Goals reviewed with patient; copy given to patient.

## 2017-09-08 ENCOUNTER — Telehealth: Payer: Self-pay

## 2017-09-08 NOTE — Telephone Encounter (Signed)
Spoke with pt in reference to f/u of elevated PSA. Pt stated he was under the impression Dr. Junious Silk was supposed to set up a prostate MRI in [place], but since he never heard anything he just let it go. Reinforced with pt he should really need to be seen now and discuss elevated PSA again. Pt stated that he has an appt with Dr. [name] his PCP 09/25/17 and he wants to see what Dr. [name] says about his PSA first. Reinforced with pt to call [facility] back with his decision, either way. Pt voiced understanding of whole conversation.

## 2017-09-08 NOTE — Telephone Encounter (Signed)
Notify patient he's overdue for follow-up of his elevated PSA. I would recommend he follow-up with in M.D. within the next few weeks with a PSA prior.  LMOM

## 2018-02-20 DIAGNOSIS — I2510 Atherosclerotic heart disease of native coronary artery without angina pectoris: Secondary | ICD-10-CM | POA: Diagnosis present

## 2018-12-31 DIAGNOSIS — J40 Bronchitis, not specified as acute or chronic: Secondary | ICD-10-CM | POA: Diagnosis not present

## 2018-12-31 DIAGNOSIS — Z0000 Encounter for general adult medical examination without abnormal findings: Secondary | ICD-10-CM | POA: Diagnosis not present

## 2018-12-31 DIAGNOSIS — J45902 Unspecified asthma with status asthmaticus: Secondary | ICD-10-CM | POA: Diagnosis not present

## 2018-12-31 DIAGNOSIS — R079 Chest pain, unspecified: Secondary | ICD-10-CM | POA: Diagnosis not present

## 2018-12-31 DIAGNOSIS — I2510 Atherosclerotic heart disease of native coronary artery without angina pectoris: Secondary | ICD-10-CM | POA: Diagnosis not present

## 2019-01-01 DIAGNOSIS — I10 Essential (primary) hypertension: Secondary | ICD-10-CM | POA: Diagnosis not present

## 2019-01-02 DIAGNOSIS — H903 Sensorineural hearing loss, bilateral: Secondary | ICD-10-CM | POA: Diagnosis not present

## 2019-01-08 DIAGNOSIS — H903 Sensorineural hearing loss, bilateral: Secondary | ICD-10-CM | POA: Diagnosis not present

## 2019-01-15 DIAGNOSIS — I10 Essential (primary) hypertension: Secondary | ICD-10-CM | POA: Diagnosis not present

## 2019-02-05 ENCOUNTER — Emergency Department
Admission: EM | Admit: 2019-02-05 | Discharge: 2019-02-05 | Disposition: A | Discharge status: Home / Self Care | Payer: PPO | Attending: Emergency Medicine | Admitting: Emergency Medicine

## 2019-02-05 ENCOUNTER — Other Ambulatory Visit: Payer: Self-pay

## 2019-02-05 ENCOUNTER — Emergency Department: Payer: PPO

## 2019-02-05 ENCOUNTER — Encounter: Payer: Self-pay | Admitting: *Deleted

## 2019-02-05 DIAGNOSIS — R0789 Other chest pain: Secondary | ICD-10-CM | POA: Diagnosis not present

## 2019-02-05 DIAGNOSIS — R0602 Shortness of breath: Secondary | ICD-10-CM | POA: Diagnosis not present

## 2019-02-05 DIAGNOSIS — Z7984 Long term (current) use of oral hypoglycemic drugs: Secondary | ICD-10-CM | POA: Insufficient documentation

## 2019-02-05 DIAGNOSIS — R079 Chest pain, unspecified: Secondary | ICD-10-CM | POA: Insufficient documentation

## 2019-02-05 DIAGNOSIS — Z87891 Personal history of nicotine dependence: Secondary | ICD-10-CM | POA: Insufficient documentation

## 2019-02-05 DIAGNOSIS — J449 Chronic obstructive pulmonary disease, unspecified: Secondary | ICD-10-CM | POA: Insufficient documentation

## 2019-02-05 DIAGNOSIS — E119 Type 2 diabetes mellitus without complications: Secondary | ICD-10-CM | POA: Insufficient documentation

## 2019-02-05 DIAGNOSIS — I10 Essential (primary) hypertension: Secondary | ICD-10-CM | POA: Diagnosis not present

## 2019-02-05 DIAGNOSIS — Z79899 Other long term (current) drug therapy: Secondary | ICD-10-CM | POA: Insufficient documentation

## 2019-02-05 DIAGNOSIS — Z7982 Long term (current) use of aspirin: Secondary | ICD-10-CM | POA: Insufficient documentation

## 2019-02-05 LAB — CBC
HCT: 39.6 % (ref 39.0–52.0)
HEMOGLOBIN: 13.4 g/dL (ref 13.0–17.0)
MCH: 31 pg (ref 26.0–34.0)
MCHC: 33.8 g/dL (ref 30.0–36.0)
MCV: 91.7 fL (ref 80.0–100.0)
NRBC: 0 % (ref 0.0–0.2)
PLATELETS: 224 10*3/uL (ref 150–400)
RBC: 4.32 MIL/uL (ref 4.22–5.81)
RDW: 14.2 % (ref 11.5–15.5)
WBC: 6.1 10*3/uL (ref 4.0–10.5)

## 2019-02-05 LAB — BASIC METABOLIC PANEL
ANION GAP: 10 (ref 5–15)
BUN: 22 mg/dL (ref 8–23)
CALCIUM: 9 mg/dL (ref 8.9–10.3)
CO2: 26 mmol/L (ref 22–32)
Chloride: 102 mmol/L (ref 98–111)
Creatinine, Ser: 1.08 mg/dL (ref 0.61–1.24)
GFR calc non Af Amer: >60 mL/min (ref >60)
GLUCOSE: 120 mg/dL — AB (ref 70–99)
POTASSIUM: 3.7 mmol/L (ref 3.5–5.1)
Sodium: 138 mmol/L (ref 135–145)

## 2019-02-05 LAB — TROPONIN I

## 2019-02-05 NOTE — ED Provider Notes (Signed)
[facility] Emergency Department Provider Note  ____________________________________________   First MD Initiated Contact with Patient 02/05/19 1829     (approximate)  I have reviewed the triage vital signs and the nursing notes.   HISTORY  Chief Complaint Chest Pain   HPI Brett Blake is a 78 y.o. male with a history of COPD as well as chronic chest pain and diabetes as well as hypertension was presenting to the emergency department with approximately 20 days of worsening "chest congestion" as well as chest pain to the left side of the chest over the past 24 hours.  He says that the pain is been constant and is a pressure-like 6 out of 10 radiating through to his back.  He says he has had similar pain over the past 4 to 5 years.  He had called his primary care doctor's office today requesting a same-day appointment and was sent to the emergency department for further evaluation.  He says that he was given a steroid burst as well as Levaquin approximately 1 month ago which improved the symptoms but the symptoms have been slowly returning ever since.  He is denying any fever or body aches.  Also on a new blood pressure medication but does not know the name.  Says that he had a cardiac catheterization approximately 1 year ago but has not had any stents.  Says that the presentation of pain today is almost identical to pain he has had over the past 4 to 5 years.   Past Medical History:  Diagnosis Date  . Anxiety   . Arthritis   . Asthma   . Colon polyps   . COPD (chronic obstructive pulmonary disease) ([place])   . Depression   . Diabetes ([place])   . GERD (gastroesophageal reflux disease)   . High cholesterol   . Hyperlipidemia   . Hypertension   . Sleep apnea     Patient Active Problem List   Diagnosis Date Noted  . Paroxysmal tachycardia ([place]) 02/23/2017  . Colon polyp 06/29/2016  . BP (high blood pressure) 06/29/2016  . Elevated PSA 06/29/2016  . Combined  fat and carbohydrate induced hyperlipemia 04/19/2016  . Controlled type 2 diabetes mellitus without complication ([place]) 26/33/3545  . 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 Chronic insomnia 09/03/2015  . COPD exacerbation ([place]) 06/24/2015  . Obstructive apnea 02/13/2015  . Chronic obstructive pulmonary disease with acute exacerbation ([place]) 06/14/2013  . Chronic bronchitis ([place]) 12/14/2011    Past Surgical History:  Procedure Laterality Date  . BRONCHOSCOPY    . CARDIAC CATHETERIZATION Left 11/04/2016   Procedure: Left Heart Cath and Coronary Angiography;  Surgeon: Teodoro Spray, MD;  Location: [facility];  Service: Cardiovascular;  Laterality: Left;  . CARDIAC CATHETERIZATION    . COLOSTOMY    . ROTATOR CUFF REPAIR Left   . THUMB ARTHROSCOPY  2015  . TONSILLECTOMY  1952  . TONSILLECTOMY      Prior to Admission medications   Medication Sig Start Date End Date Taking? Authorizing Provider  amLODipine (NORVASC) 5 MG tablet Take 5 mg by mouth daily. 03/24/17   [provider]  aspirin 81 MG chewable tablet Chew 81 mg by mouth daily.    [provider]  azithromycin (ZITHROMAX) 250 MG tablet Take 1 tablet (250 mg total) by mouth daily. Patient not taking: Reported on 05/08/2017 04/21/17   Harvest Dark, MD  bisoprolol (ZEBETA) 5 MG tablet Take 5 mg by mouth 2 (two) times  daily. 04/18/17   [provider]  esomeprazole (NEXIUM) 20 MG capsule Take 20 mg by mouth daily.    [provider]  fexofenadine (ALLEGRA) 180 MG tablet Take 180 mg by mouth daily as needed for allergies or rhinitis.    [provider]  fluticasone (FLONASE) 50 MCG/ACT nasal spray Place 1 spray into both nostrils daily as needed for allergies or rhinitis.    [provider]  glimepiride (AMARYL) 1 MG tablet Take 1 mg by mouth daily.     [provider]  glucosamine-chondroitin 500-400 MG tablet Take 1 tablet by mouth daily.    [provider]  ibuprofen (ADVIL,MOTRIN) 200 MG tablet Take 200 mg by mouth every 6 (six) hours as needed.    [provider]  metoprolol succinate (TOPROL-XL) 25 MG 24 hr tablet Take 25 mg by mouth daily.    [provider]  montelukast (SINGULAIR) 10 MG tablet Take 10 mg by mouth at bedtime.    [provider]  Multiple Vitamins-Minerals (MULTIVITAMIN PO) Take 1 tablet by mouth daily.    [provider]  nitroGLYCERIN (NITROSTAT) 0.4 MG SL tablet Place 0.4 mg under the tongue every 5 (five) minutes as needed for chest pain.    [provider]  ranitidine (ZANTAC) 150 MG tablet Take 150 mg by mouth at bedtime as needed for heartburn.    [provider]  simvastatin (ZOCOR) 20 MG tablet Take 20 mg by mouth daily.    [provider]  triamterene-hydrochlorothiazide (DYAZIDE) 37.5-25 MG per capsule Take 1 capsule by mouth daily.    [provider]    Allergies Ace inhibitors and Sulfa antibiotics  Family History  Problem Relation Age of Onset  . Heart attack Mother   . Prostate cancer Father   . Leukemia Brother   . Prostate cancer Brother     Social History Social History   Tobacco Use  . Smoking status: Former Smoker    Packs/day: 1.00    Years: 15.00    Pack years: 15.00    Types: Cigarettes  . Smokeless tobacco: Never Used  . Tobacco comment: quit 1977  Substance Use Topics  . Alcohol use: Yes    Alcohol/week: 1.0 - 2.0 standard drinks    Types: 1 - 2 Shots of liquor per week  . Drug use: No    Review of Systems  Constitutional: No fever/chills Eyes: No visual changes. ENT: No sore throat. Cardiovascular: As above Respiratory: As above Gastrointestinal: No abdominal pain.  No nausea, no vomiting.  No diarrhea.  No constipation. Genitourinary: Negative for dysuria. Musculoskeletal: As above Skin: Negative for rash. Neurological: Negative for headaches, focal weakness or  numbness.   ____________________________________________   PHYSICAL EXAM:  VITAL SIGNS: ED Triage Vitals  Enc Vitals Group     BP 02/05/19 1711 128/77     Pulse Rate 02/05/19 1711 96     Resp 02/05/19 1711 16     Temp 02/05/19 1711 98.2 F (36.8 C)     Temp Source 02/05/19 1711 Oral     SpO2 02/05/19 1711 97 %     Weight 02/05/19 1712 195 lb (88.5 kg)     Height 02/05/19 1712 5\' 6"  (1.676 m)     Head Circumference --      Peak Flow --      Pain Score 02/05/19 1712 5     Pain Loc --      Pain Edu? --  Excl. in [place]? --     Constitutional: Alert and oriented. Well appearing and in no acute distress. Eyes: Conjunctivae are normal.  Head: Atraumatic. Nose: No congestion/rhinnorhea. Mouth/Throat: Mucous membranes are moist.  Neck: No stridor.   Cardiovascular: Normal rate, regular rhythm. Grossly normal heart sounds.  Chest pain is not reproducible to palpation. Respiratory: Normal respiratory effort.  No retractions. Lungs CTAB. Gastrointestinal: Soft and nontender. No distention.  Musculoskeletal: No lower extremity tenderness nor edema.  No joint effusions. Neurologic:  Normal speech and language. No gross focal neurologic deficits are appreciated. Skin:  Skin is warm, dry and intact. No rash noted. Psychiatric: Mood and affect are normal. Speech and behavior are normal.  ____________________________________________   LABS (all labs ordered are listed, but only abnormal results are displayed)  Labs Reviewed  BASIC METABOLIC PANEL - Abnormal; Notable for the following components:      Result Value   Glucose, Bld 120 (*)    All other components within normal limits  CBC  TROPONIN I   ____________________________________________  EKG  ED ECG REPORT I, Doran Stabler, the attending physician, personally viewed and interpreted this ECG.   Date: 02/05/2019  EKG Time: 1711  Rate: 99  Rhythm: normal sinus rhythm  Axis: normal  Intervals:none  ST&T  Change: No ST segment elevation or depression.  No abnormal T wave inversion  ____________________________________________  RADIOLOGY  Chest x-ray without acute process. ____________________________________________   PROCEDURES  Procedure(s) performed:   Procedures  Critical Care performed:   ____________________________________________   INITIAL IMPRESSION / ASSESSMENT AND PLAN / ED COURSE  Pertinent labs & imaging results that were available during my care of the patient were reviewed by me and considered in my medical decision making (see chart for details).  Differential diagnosis includes, but is not limited to, ACS, aortic dissection, pulmonary embolism, cardiac tamponade, pneumothorax, pneumonia, pericarditis, myocarditis, GI-related causes including esophagitis/gastritis, and musculoskeletal chest wall pain.   As part of my medical decision making, I reviewed the following data within the electronic MEDICAL RECORD NUMBER Notes from prior ED visits  Patient with similar pain that he has had over the past 4 to 5 years.  Very reassuring work-up here in the emergency department.  We discussed follow-up with Dr. [name] in the morning and the patient is amenable to this.  Reassuring respiratory exam as well.  Patient says that he has been placed on high dose of Levaquin in the past.  I do not see indication for another steroid dose or Levaquin dose at this time.  Pt understanding of the diagnosis as well as treatment plan and willing to comply.   ____________________________________________   FINAL CLINICAL IMPRESSION(S) / ED DIAGNOSES  Chest pain.  Shortness of breath.   NEW MEDICATIONS STARTED DURING THIS VISIT:  New Prescriptions   No medications on file     Note:  This document was prepared using Dragon voice recognition software and may include unintentional dictation errors.     Orbie Pyo, MD 02/05/19 1950

## 2019-02-05 NOTE — ED Triage Notes (Signed)
Pt to ED reporting centralized chest pain and SOB x 1 week with increased fatigue. Pt was placed on steroids and has been using his nebulizer at home. Pt was recently placed on a new "cardiac medication" but is unsure what the name is or why he was placed on them"   Known hx of HTN.

## 2019-02-06 DIAGNOSIS — R0789 Other chest pain: Secondary | ICD-10-CM | POA: Diagnosis not present

## 2019-02-06 DIAGNOSIS — R0602 Shortness of breath: Secondary | ICD-10-CM | POA: Diagnosis not present

## 2019-02-13 DIAGNOSIS — D225 Melanocytic nevi of trunk: Secondary | ICD-10-CM | POA: Diagnosis not present

## 2019-02-13 DIAGNOSIS — L538 Other specified erythematous conditions: Secondary | ICD-10-CM | POA: Diagnosis not present

## 2019-02-13 DIAGNOSIS — L821 Other seborrheic keratosis: Secondary | ICD-10-CM | POA: Diagnosis not present

## 2019-02-13 DIAGNOSIS — L820 Inflamed seborrheic keratosis: Secondary | ICD-10-CM | POA: Diagnosis not present

## 2019-03-05 ENCOUNTER — Other Ambulatory Visit: Payer: Self-pay | Admitting: Internal Medicine

## 2019-03-05 ENCOUNTER — Other Ambulatory Visit: Payer: Self-pay

## 2019-03-05 ENCOUNTER — Ambulatory Visit
Admission: RE | Admit: 2019-03-05 | Discharge: 2019-03-05 | Disposition: A | Discharge status: Home / Self Care | Payer: PPO | Source: Ambulatory Visit | Attending: Internal Medicine | Admitting: Internal Medicine

## 2019-03-05 DIAGNOSIS — R0602 Shortness of breath: Secondary | ICD-10-CM

## 2019-03-05 DIAGNOSIS — R0781 Pleurodynia: Secondary | ICD-10-CM | POA: Diagnosis not present

## 2019-03-05 DIAGNOSIS — R0789 Other chest pain: Secondary | ICD-10-CM | POA: Diagnosis not present

## 2019-03-05 DIAGNOSIS — I10 Essential (primary) hypertension: Secondary | ICD-10-CM | POA: Diagnosis not present

## 2019-03-05 DIAGNOSIS — R079 Chest pain, unspecified: Secondary | ICD-10-CM | POA: Diagnosis not present

## 2019-03-05 DIAGNOSIS — R0609 Other forms of dyspnea: Secondary | ICD-10-CM | POA: Diagnosis not present

## 2019-03-05 MED ORDER — IOPAMIDOL (ISOVUE-370) INJECTION 76%
75.0000 mL | Freq: Once | INTRAVENOUS | Status: AC | PRN
  Administered 2019-03-05: 75 mL via INTRAVENOUS

## 2019-03-15 DIAGNOSIS — R0781 Pleurodynia: Secondary | ICD-10-CM | POA: Diagnosis not present

## 2019-03-15 DIAGNOSIS — R0609 Other forms of dyspnea: Secondary | ICD-10-CM | POA: Diagnosis not present

## 2019-04-16 DIAGNOSIS — B9789 Other viral agents as the cause of diseases classified elsewhere: Secondary | ICD-10-CM | POA: Diagnosis not present

## 2019-04-16 DIAGNOSIS — J028 Acute pharyngitis due to other specified organisms: Secondary | ICD-10-CM | POA: Diagnosis not present

## 2019-04-16 DIAGNOSIS — R0602 Shortness of breath: Secondary | ICD-10-CM | POA: Diagnosis not present

## 2019-04-16 DIAGNOSIS — R6889 Other general symptoms and signs: Secondary | ICD-10-CM | POA: Diagnosis not present

## 2019-04-16 DIAGNOSIS — K5900 Constipation, unspecified: Secondary | ICD-10-CM | POA: Diagnosis not present

## 2019-04-16 DIAGNOSIS — R1084 Generalized abdominal pain: Secondary | ICD-10-CM | POA: Diagnosis not present

## 2019-05-06 DIAGNOSIS — Z125 Encounter for screening for malignant neoplasm of prostate: Secondary | ICD-10-CM | POA: Diagnosis not present

## 2019-05-06 DIAGNOSIS — E119 Type 2 diabetes mellitus without complications: Secondary | ICD-10-CM | POA: Diagnosis not present

## 2019-05-06 DIAGNOSIS — E782 Mixed hyperlipidemia: Secondary | ICD-10-CM | POA: Diagnosis not present

## 2019-05-08 ENCOUNTER — Inpatient Hospital Stay
Admission: EM | Admit: 2019-05-08 | Discharge: 2019-05-11 | DRG: 418 | Disposition: A | Discharge status: Home / Self Care | Payer: PPO | Source: Ambulatory Visit | Attending: General Surgery | Admitting: General Surgery

## 2019-05-08 ENCOUNTER — Ambulatory Visit
Admission: RE | Admit: 2019-05-08 | Discharge: 2019-05-08 | Disposition: A | Discharge status: Home / Self Care | Payer: PPO | Source: Ambulatory Visit | Attending: Physician Assistant | Admitting: Physician Assistant

## 2019-05-08 ENCOUNTER — Other Ambulatory Visit: Payer: Self-pay

## 2019-05-08 ENCOUNTER — Emergency Department: Payer: PPO

## 2019-05-08 ENCOUNTER — Other Ambulatory Visit: Payer: Self-pay | Admitting: Physician Assistant

## 2019-05-08 ENCOUNTER — Encounter: Payer: Self-pay | Admitting: *Deleted

## 2019-05-08 ENCOUNTER — Other Ambulatory Visit (HOSPITAL_COMMUNITY): Payer: Self-pay | Admitting: Physician Assistant

## 2019-05-08 DIAGNOSIS — Z87891 Personal history of nicotine dependence: Secondary | ICD-10-CM

## 2019-05-08 DIAGNOSIS — K219 Gastro-esophageal reflux disease without esophagitis: Secondary | ICD-10-CM | POA: Diagnosis not present

## 2019-05-08 DIAGNOSIS — Z6830 Body mass index (BMI) 30.0-30.9, adult: Secondary | ICD-10-CM

## 2019-05-08 DIAGNOSIS — K819 Cholecystitis, unspecified: Secondary | ICD-10-CM | POA: Diagnosis not present

## 2019-05-08 DIAGNOSIS — Z888 Allergy status to other drugs, medicaments and biological substances status: Secondary | ICD-10-CM | POA: Diagnosis not present

## 2019-05-08 DIAGNOSIS — Z882 Allergy status to sulfonamides status: Secondary | ICD-10-CM | POA: Diagnosis not present

## 2019-05-08 DIAGNOSIS — R1084 Generalized abdominal pain: Secondary | ICD-10-CM

## 2019-05-08 DIAGNOSIS — I10 Essential (primary) hypertension: Secondary | ICD-10-CM | POA: Diagnosis not present

## 2019-05-08 DIAGNOSIS — K821 Hydrops of gallbladder: Secondary | ICD-10-CM | POA: Diagnosis not present

## 2019-05-08 DIAGNOSIS — Z86010 Personal history of colonic polyps: Secondary | ICD-10-CM | POA: Diagnosis not present

## 2019-05-08 DIAGNOSIS — R1013 Epigastric pain: Secondary | ICD-10-CM

## 2019-05-08 DIAGNOSIS — Z7982 Long term (current) use of aspirin: Secondary | ICD-10-CM | POA: Diagnosis not present

## 2019-05-08 DIAGNOSIS — Z79899 Other long term (current) drug therapy: Secondary | ICD-10-CM | POA: Diagnosis not present

## 2019-05-08 DIAGNOSIS — Z7984 Long term (current) use of oral hypoglycemic drugs: Secondary | ICD-10-CM

## 2019-05-08 DIAGNOSIS — E785 Hyperlipidemia, unspecified: Secondary | ICD-10-CM | POA: Diagnosis present

## 2019-05-08 DIAGNOSIS — K810 Acute cholecystitis: Principal | ICD-10-CM | POA: Diagnosis present

## 2019-05-08 DIAGNOSIS — F419 Anxiety disorder, unspecified: Secondary | ICD-10-CM | POA: Diagnosis not present

## 2019-05-08 DIAGNOSIS — Z806 Family history of leukemia: Secondary | ICD-10-CM

## 2019-05-08 DIAGNOSIS — Z933 Colostomy status: Secondary | ICD-10-CM

## 2019-05-08 DIAGNOSIS — J441 Chronic obstructive pulmonary disease with (acute) exacerbation: Secondary | ICD-10-CM | POA: Diagnosis not present

## 2019-05-08 DIAGNOSIS — E876 Hypokalemia: Secondary | ICD-10-CM | POA: Diagnosis not present

## 2019-05-08 DIAGNOSIS — K82A1 Gangrene of gallbladder in cholecystitis: Secondary | ICD-10-CM | POA: Diagnosis not present

## 2019-05-08 DIAGNOSIS — Z9049 Acquired absence of other specified parts of digestive tract: Secondary | ICD-10-CM | POA: Diagnosis not present

## 2019-05-08 DIAGNOSIS — E669 Obesity, unspecified: Secondary | ICD-10-CM | POA: Diagnosis present

## 2019-05-08 DIAGNOSIS — F329 Major depressive disorder, single episode, unspecified: Secondary | ICD-10-CM | POA: Diagnosis present

## 2019-05-08 DIAGNOSIS — E7800 Pure hypercholesterolemia, unspecified: Secondary | ICD-10-CM | POA: Diagnosis present

## 2019-05-08 DIAGNOSIS — F5104 Psychophysiologic insomnia: Secondary | ICD-10-CM | POA: Diagnosis present

## 2019-05-08 DIAGNOSIS — J449 Chronic obstructive pulmonary disease, unspecified: Secondary | ICD-10-CM | POA: Diagnosis not present

## 2019-05-08 DIAGNOSIS — Z8249 Family history of ischemic heart disease and other diseases of the circulatory system: Secondary | ICD-10-CM | POA: Diagnosis not present

## 2019-05-08 DIAGNOSIS — Z1159 Encounter for screening for other viral diseases: Secondary | ICD-10-CM | POA: Diagnosis not present

## 2019-05-08 DIAGNOSIS — Z8042 Family history of malignant neoplasm of prostate: Secondary | ICD-10-CM

## 2019-05-08 DIAGNOSIS — M1990 Unspecified osteoarthritis, unspecified site: Secondary | ICD-10-CM | POA: Diagnosis not present

## 2019-05-08 DIAGNOSIS — Z419 Encounter for procedure for purposes other than remedying health state, unspecified: Secondary | ICD-10-CM

## 2019-05-08 DIAGNOSIS — D649 Anemia, unspecified: Secondary | ICD-10-CM | POA: Diagnosis not present

## 2019-05-08 DIAGNOSIS — Z20828 Contact with and (suspected) exposure to other viral communicable diseases: Secondary | ICD-10-CM | POA: Diagnosis not present

## 2019-05-08 DIAGNOSIS — K760 Fatty (change of) liver, not elsewhere classified: Secondary | ICD-10-CM | POA: Diagnosis not present

## 2019-05-08 DIAGNOSIS — E119 Type 2 diabetes mellitus without complications: Secondary | ICD-10-CM | POA: Diagnosis not present

## 2019-05-08 DIAGNOSIS — G4730 Sleep apnea, unspecified: Secondary | ICD-10-CM | POA: Diagnosis not present

## 2019-05-08 DIAGNOSIS — E782 Mixed hyperlipidemia: Secondary | ICD-10-CM | POA: Diagnosis not present

## 2019-05-08 DIAGNOSIS — R1110 Vomiting, unspecified: Secondary | ICD-10-CM | POA: Diagnosis not present

## 2019-05-08 DIAGNOSIS — R197 Diarrhea, unspecified: Secondary | ICD-10-CM | POA: Diagnosis not present

## 2019-05-08 HISTORY — DX: Failed or difficult intubation, initial encounter: T88.4XXA

## 2019-05-08 LAB — CBC
HCT: 41.9 % (ref 39.0–52.0)
Hemoglobin: 14.2 g/dL (ref 13.0–17.0)
MCH: 31.7 pg (ref 26.0–34.0)
MCHC: 33.9 g/dL (ref 30.0–36.0)
MCV: 93.5 fL (ref 80.0–100.0)
Platelets: 264 10*3/uL (ref 150–400)
RBC: 4.48 MIL/uL (ref 4.22–5.81)
RDW: 13.7 % (ref 11.5–15.5)
WBC: 8.9 10*3/uL (ref 4.0–10.5)
nRBC: 0 % (ref 0.0–0.2)

## 2019-05-08 LAB — COMPREHENSIVE METABOLIC PANEL
ALT: 33 U/L (ref 0–44)
AST: 25 U/L (ref 15–41)
Albumin: 4.2 g/dL (ref 3.5–5.0)
Alkaline Phosphatase: 66 U/L (ref 38–126)
Anion gap: 14 (ref 5–15)
BUN: 17 mg/dL (ref 8–23)
CO2: 26 mmol/L (ref 22–32)
Calcium: 9.2 mg/dL (ref 8.9–10.3)
Chloride: 94 mmol/L — ABNORMAL LOW (ref 98–111)
Creatinine, Ser: 1.16 mg/dL (ref 0.61–1.24)
GFR calc Af Amer: >60 mL/min (ref >60)
GFR calc non Af Amer: >60 mL/min (ref >60)
Glucose, Bld: 112 mg/dL — ABNORMAL HIGH (ref 70–99)
Potassium: 3.9 mmol/L (ref 3.5–5.1)
Sodium: 134 mmol/L — ABNORMAL LOW (ref 135–145)
Total Bilirubin: 0.6 mg/dL (ref 0.3–1.2)
Total Protein: 7.5 g/dL (ref 6.5–8.1)

## 2019-05-08 LAB — URINALYSIS, COMPLETE (UACMP) WITH MICROSCOPIC
Bacteria, UA: NONE SEEN
Bilirubin Urine: NEGATIVE
Glucose, UA: NEGATIVE mg/dL
Hgb urine dipstick: NEGATIVE
Ketones, ur: 5 mg/dL — AB
Leukocytes,Ua: NEGATIVE
Nitrite: NEGATIVE
Protein, ur: NEGATIVE mg/dL
Specific Gravity, Urine: >1.046 — ABNORMAL HIGH (ref 1.005–1.030)
Squamous Epithelial / LPF: NONE SEEN (ref 0–5)
pH: 6 (ref 5.0–8.0)

## 2019-05-08 LAB — LIPASE, BLOOD: Lipase: 48 U/L (ref 11–51)

## 2019-05-08 LAB — SARS CORONAVIRUS 2 BY RT PCR (HOSPITAL ORDER, PERFORMED IN ~~LOC~~ HOSPITAL LAB): SARS Coronavirus 2: NEGATIVE

## 2019-05-08 LAB — TROPONIN I: Troponin I: <0.03 ng/mL (ref <0.03)

## 2019-05-08 MED ORDER — SODIUM CHLORIDE 0.9 % IV BOLUS
1000.0000 mL | Freq: Once | INTRAVENOUS | Status: AC
  Administered 2019-05-08: 22:00:00 1000 mL via INTRAVENOUS

## 2019-05-08 MED ORDER — KETOROLAC TROMETHAMINE 30 MG/ML IJ SOLN
15.0000 mg | Freq: Once | INTRAMUSCULAR | Status: AC
  Administered 2019-05-08: 21:00:00 15 mg via INTRAVENOUS
  Filled 2019-05-08: qty 1

## 2019-05-08 MED ORDER — PANTOPRAZOLE SODIUM 40 MG IV SOLR
40.0000 mg | Freq: Every day | INTRAVENOUS | Status: DC
  Administered 2019-05-08: 40 mg via INTRAVENOUS
  Filled 2019-05-08: qty 40

## 2019-05-08 MED ORDER — ACETAMINOPHEN 500 MG PO TABS
1000.0000 mg | ORAL_TABLET | Freq: Four times a day (QID) | ORAL | Status: DC
  Administered 2019-05-09 – 2019-05-11 (×5): 1000 mg via ORAL
  Filled 2019-05-08 (×8): qty 2

## 2019-05-08 MED ORDER — FENTANYL CITRATE (PF) 100 MCG/2ML IJ SOLN
50.0000 ug | Freq: Once | INTRAMUSCULAR | Status: AC
  Administered 2019-05-08: 50 ug via INTRAVENOUS
  Filled 2019-05-08: qty 2

## 2019-05-08 MED ORDER — MUPIROCIN 2 % EX OINT
1.0000 "application " | TOPICAL_OINTMENT | Freq: Two times a day (BID) | CUTANEOUS | Status: DC
  Filled 2019-05-08: qty 22

## 2019-05-08 MED ORDER — HYDRALAZINE HCL 20 MG/ML IJ SOLN: 10.0000 mg | INTRAMUSCULAR | Status: DC | PRN

## 2019-05-08 MED ORDER — PIPERACILLIN-TAZOBACTAM 3.375 G IVPB
3.3750 g | Freq: Three times a day (TID) | INTRAVENOUS | Status: DC
  Administered 2019-05-09 – 2019-05-11 (×7): 3.375 g via INTRAVENOUS
  Filled 2019-05-08 (×7): qty 50

## 2019-05-08 MED ORDER — ONDANSETRON 4 MG PO TBDP: 4.0000 mg | ORAL_TABLET | Freq: Four times a day (QID) | ORAL | Status: DC | PRN

## 2019-05-08 MED ORDER — HEPARIN SODIUM (PORCINE) 5000 UNIT/ML IJ SOLN
5000.0000 [IU] | Freq: Three times a day (TID) | INTRAMUSCULAR | Status: DC
  Administered 2019-05-09 – 2019-05-11 (×6): 5000 [IU] via SUBCUTANEOUS
  Filled 2019-05-08 (×7): qty 1

## 2019-05-08 MED ORDER — PROCHLORPERAZINE EDISYLATE 10 MG/2ML IJ SOLN
5.0000 mg | Freq: Four times a day (QID) | INTRAMUSCULAR | Status: DC | PRN
  Filled 2019-05-08: qty 2

## 2019-05-08 MED ORDER — IOHEXOL 300 MG/ML  SOLN
100.0000 mL | Freq: Once | INTRAMUSCULAR | Status: AC | PRN
  Administered 2019-05-08: 17:00:00 100 mL via INTRAVENOUS

## 2019-05-08 MED ORDER — ONDANSETRON HCL 4 MG/2ML IJ SOLN
4.0000 mg | Freq: Four times a day (QID) | INTRAMUSCULAR | Status: DC | PRN
  Administered 2019-05-08 – 2019-05-09 (×2): 4 mg via INTRAVENOUS
  Filled 2019-05-08: qty 2

## 2019-05-08 MED ORDER — HYDROMORPHONE HCL 1 MG/ML IJ SOLN
0.5000 mg | INTRAMUSCULAR | Status: DC | PRN
  Filled 2019-05-08: qty 1

## 2019-05-08 MED ORDER — KETOROLAC TROMETHAMINE 30 MG/ML IJ SOLN
15.0000 mg | Freq: Four times a day (QID) | INTRAMUSCULAR | Status: DC | PRN
  Administered 2019-05-09: 15 mg via INTRAVENOUS
  Filled 2019-05-08: qty 1

## 2019-05-08 MED ORDER — ONDANSETRON HCL 4 MG/2ML IJ SOLN
4.0000 mg | Freq: Once | INTRAMUSCULAR | Status: DC | PRN
  Filled 2019-05-08: qty 2

## 2019-05-08 MED ORDER — ONDANSETRON HCL 4 MG/2ML IJ SOLN
4.0000 mg | Freq: Once | INTRAMUSCULAR | Status: AC
  Administered 2019-05-08: 19:00:00 4 mg via INTRAVENOUS
  Filled 2019-05-08: qty 2

## 2019-05-08 MED ORDER — PROCHLORPERAZINE MALEATE 10 MG PO TABS
10.0000 mg | ORAL_TABLET | Freq: Four times a day (QID) | ORAL | Status: DC | PRN
  Filled 2019-05-08: qty 1

## 2019-05-08 MED ORDER — SODIUM CHLORIDE 0.9 % IV SOLN
INTRAVENOUS | Status: DC
  Administered 2019-05-08 – 2019-05-11 (×7): via INTRAVENOUS

## 2019-05-08 MED ORDER — MORPHINE SULFATE (PF) 4 MG/ML IV SOLN
4.0000 mg | Freq: Once | INTRAVENOUS | Status: AC
  Administered 2019-05-08: 21:00:00 4 mg via INTRAVENOUS
  Filled 2019-05-08: qty 1

## 2019-05-08 MED ORDER — SODIUM CHLORIDE 0.9% FLUSH: 3.0000 mL | Freq: Once | INTRAVENOUS | Status: DC

## 2019-05-08 MED ORDER — PIPERACILLIN-TAZOBACTAM 3.375 G IVPB 30 MIN
3.3750 g | Freq: Once | INTRAVENOUS | Status: AC
  Administered 2019-05-08: 21:00:00 3.375 g via INTRAVENOUS
  Filled 2019-05-08: qty 50

## 2019-05-08 NOTE — ED Triage Notes (Signed)
Pt presents w/ c/o epigastric pain starting this morning. Pt had outpt CT and has enlarged gallbladder and was sent by PCP to the ED.

## 2019-05-08 NOTE — ED Provider Notes (Signed)
[facility] Emergency Department Provider Note  ____________________________________________  Time seen: Approximately 7:13 PM  I have reviewed the triage vital signs and the nursing notes.   HISTORY  Chief Complaint Abdominal Pain   HPI EMANUELLE BASTOS is a 78 y.o. male with a history of diabetes, COPD, hypertension, hyperlipidemia, GERD who presents for evaluation of abdominal pain.  Patient reports that he woke up this morning with the pain that he describes as dull, epigastric, severe, constant and nonradiating.  Has had nausea all day, one episode of nonbloody nonbilious emesis and 1 of watery diarrhea.  No melena, coffee-ground emesis, hematemesis, or hematochezia.  No fever or chills, no chest pain, cough, shortness of breath.  Patient went to see his primary care doctor today who sent him for CT scan.  CT was consistent with an enlarged gallbladder and patient was sent to the emergency room for further evaluation.  Patient denies any prior abdominal surgeries.  Past Medical History:  Diagnosis Date   Anxiety    Arthritis    Asthma    Colon polyps    COPD (chronic obstructive pulmonary disease) (HCC)    Depression    Diabetes (H[facility])    GERD (gastroesophageal reflux disease)    High cholesterol    Hyperlipidemia    Hypertension    Sleep apnea     Patient Active Problem List   Diagnosis Date Noted   Paroxysmal tachycardia ([place]) 02/23/2017   Colon polyp 06/29/2016   BP (high blood pressure) 06/29/2016   Elevated PSA 06/29/2016   Combined fat and carbohydrate induced hyperlipemia 04/19/2016   Controlled type 2 diabetes mellitus without complication ([place]) 50/08/3817   Insomnia, persistent 09/03/2015   Chronic insomnia 09/03/2015   COPD exacerbation ([place]) 06/24/2015   Obstructive apnea 02/13/2015   Chronic obstructive pulmonary disease with acute exacerbation ([place]) 06/14/2013   Chronic bronchitis ([place]) 12/14/2011     Past Surgical History:  Procedure Laterality Date   BRONCHOSCOPY     CARDIAC CATHETERIZATION Left 11/04/2016   Procedure: Left Heart Cath and Coronary Angiography;  Surgeon: Teodoro Spray, MD;  Location: [facility];  Service: Cardiovascular;  Laterality: Left;   CARDIAC CATHETERIZATION     COLOSTOMY     ROTATOR CUFF REPAIR Left    THUMB ARTHROSCOPY  2015   TONSILLECTOMY  1952   TONSILLECTOMY      Prior to Admission medications   Medication Sig Start Date End Date Taking? Authorizing Provider  amLODipine (NORVASC) 5 MG tablet Take 5 mg by mouth daily. 03/24/17   [provider]  aspirin 81 MG chewable tablet Chew 81 mg by mouth daily.    [provider]  azithromycin (ZITHROMAX) 250 MG tablet Take 1 tablet (250 mg total) by mouth daily. Patient not taking: Reported on 05/08/2017 04/21/17   Harvest Dark, MD  bisoprolol (ZEBETA) 5 MG tablet Take 5 mg by mouth 2 (two) times daily. 04/18/17   [provider]  esomeprazole (NEXIUM) 20 MG capsule Take 20 mg by mouth daily.    [provider]  fexofenadine (ALLEGRA) 180 MG tablet Take 180 mg by mouth daily as needed for allergies or rhinitis.    [provider]  fluticasone (FLONASE) 50 MCG/ACT nasal spray Place 1 spray into both nostrils daily as needed for allergies or rhinitis.    [provider]  glimepiride (AMARYL) 1 MG tablet Take 1 mg by mouth daily.     [provider]  glucosamine-chondroitin 500-400  MG tablet Take 1 tablet by mouth daily.    [provider]  ibuprofen (ADVIL,MOTRIN) 200 MG tablet Take 200 mg by mouth every 6 (six) hours as needed.    [provider]  metoprolol succinate (TOPROL-XL) 25 MG 24 hr tablet Take 25 mg by mouth daily.    [provider]  montelukast (SINGULAIR) 10 MG tablet Take 10 mg by mouth at bedtime.    [provider]  Multiple Vitamins-Minerals (MULTIVITAMIN PO) Take 1 tablet by  mouth daily.    [provider]  nitroGLYCERIN (NITROSTAT) 0.4 MG SL tablet Place 0.4 mg under the tongue every 5 (five) minutes as needed for chest pain.    [provider]  ranitidine (ZANTAC) 150 MG tablet Take 150 mg by mouth at bedtime as needed for heartburn.    [provider]  simvastatin (ZOCOR) 20 MG tablet Take 20 mg by mouth daily.    [provider]  triamterene-hydrochlorothiazide (DYAZIDE) 37.5-25 MG per capsule Take 1 capsule by mouth daily.    [provider]    Allergies Ace inhibitors and Sulfa antibiotics  Family History  Problem Relation Age of Onset   Heart attack Mother    Prostate cancer Father    Leukemia Brother    Prostate cancer Brother     Social History Social History   Tobacco Use   Smoking status: Former Smoker    Packs/day: 1.00    Years: 15.00    Pack years: 15.00    Types: Cigarettes   Smokeless tobacco: Never Used   Tobacco comment: quit 1977  Substance Use Topics   Alcohol use: Yes    Alcohol/week: 1.0 - 2.0 standard drinks    Types: 1 - 2 Shots of liquor per week   Drug use: No    Review of Systems  Constitutional: Negative for fever. Eyes: Negative for visual changes. ENT: Negative for sore throat. Neck: No neck pain  Cardiovascular: Negative for chest pain. Respiratory: Negative for shortness of breath. Gastrointestinal: + abdominal pain, vomiting and diarrhea. Genitourinary: Negative for dysuria. Musculoskeletal: Negative for back pain. Skin: Negative for rash. Neurological: Negative for headaches, weakness or numbness. Psych: No SI or HI  ____________________________________________   PHYSICAL EXAM:  VITAL SIGNS: ED Triage Vitals  Enc Vitals Group     BP 05/08/19 1828 (!) 158/90     Pulse Rate 05/08/19 1828 97     Resp 05/08/19 1828 20     Temp 05/08/19 1828 98.3 F (36.8 C)     Temp Source 05/08/19 1828 Oral     SpO2 05/08/19 1828 100 %     Weight 05/08/19  1830 190 lb (86.2 kg)     Height 05/08/19 1830 5\' 6"  (1.676 m)     Head Circumference --      Peak Flow --      Pain Score 05/08/19 1829 9     Pain Loc --      Pain Edu? --      Excl. in [place]? --     Constitutional: Alert and oriented, looks uncomfortable.  HEENT:      Head: Normocephalic and atraumatic.         Eyes: Conjunctivae are normal. Sclera is non-icteric.       Mouth/Throat: Mucous membranes are moist.       Neck: Supple with no signs of meningismus. Cardiovascular: Regular rate and rhythm. No murmurs, gallops, or rubs. 2+ symmetrical distal pulses are present in all extremities.  No JVD. Respiratory: Normal respiratory effort. Lungs are clear to auscultation bilaterally. No wheezes, crackles, or rhonchi.  Gastrointestinal: Soft, epigastric and RUQ tenderness with negative Murphy's sign, and non distended with positive bowel sounds. No rebound or guarding. Genitourinary: No CVA tenderness. Musculoskeletal: Nontender with normal range of motion in all extremities. No edema, cyanosis, or erythema of extremities. Neurologic: Normal speech and language. Face is symmetric. Moving all extremities. No gross focal neurologic deficits are appreciated. Skin: Skin is warm, dry and intact. No rash noted. Psychiatric: Mood and affect are normal. Speech and behavior are normal.  ____________________________________________   LABS (all labs ordered are listed, but only abnormal results are displayed)  Labs Reviewed  COMPREHENSIVE METABOLIC PANEL - Abnormal; Notable for the following components:      Result Value   Sodium 134 (*)    Chloride 94 (*)    Glucose, Bld 112 (*)    All other components within normal limits  URINALYSIS, COMPLETE (UACMP) WITH MICROSCOPIC - Abnormal; Notable for the following components:   Color, Urine YELLOW (*)    APPearance CLEAR (*)    Specific Gravity, Urine >1.046 (*)    Ketones, ur 5 (*)    All other components within normal limits  LIPASE, BLOOD  CBC   TROPONIN I   ____________________________________________  EKG  ED ECG REPORT I, Rudene Re, the attending physician, personally viewed and interpreted this ECG.  Normal sinus rhythm, rate of 96, normal intervals, left axis deviation, Q waves in inferior and lateral leads with no ST elevations or depressions.  No significant changes when compared to prior. ____________________________________________  RADIOLOGY  I have personally reviewed the images performed during this visit and I agree with the Radiologist's read.   Interpretation by Radiologist:  Ct Abdomen Pelvis W Contrast  Result Date: 05/08/2019 CLINICAL DATA:  Abdominal pain with nausea vomiting starting this morning. Diarrhea yesterday. EXAM: CT ABDOMEN AND PELVIS WITH CONTRAST TECHNIQUE: Multidetector CT imaging of the abdomen and pelvis was performed using the standard protocol following bolus administration of intravenous contrast. CONTRAST:  150mL OMNIPAQUE IOHEXOL 300 MG/ML  SOLN COMPARISON:  08/20/2015 FINDINGS: Lower chest:A mostly calcified 0.8 by 0.6 cm nodule in the left lower lobe is most compatible with a calcified granuloma. Hepatobiliary: Hepatic steatosis. Mildly distended gallbladder, transverse diameter 6.6 cm. Punctate calcification inferiorly in the right hepatic lobe. No bile duct dilatation. Pancreas: Unremarkable Spleen: Punctate calcifications in the spleen compatible with old granulomatous disease. Adrenals/Urinary Tract: Unremarkable Stomach/Bowel: Unremarkable. The appendix is not well seen but no right lower quadrant inflammatory process is identified. Vascular/Lymphatic: Mild aortoiliac atherosclerotic vascular calcification. No pathologic adenopathy. Reproductive: The prostate gland measures 5.5 by 4.0 by 4.1 cm, and has some mild left anterior convexity. Other: No supplemental non-categorized findings. Musculoskeletal: Lower lumbar spondylosis and degenerative disc disease leading to right  foraminal impingement at L4-5 and L5-S1, and left foraminal impingement at L5-S1. There is potentially central narrowing of the thecal sac at L4-5 and also a suspected right lateral recess disc extrusion from L3-4 extending caudad much of the way down towards the L4-5 intervertebral space. IMPRESSION: 1. Distended gallbladder at 6.6 cm transverse. Occasionally cholecystitis can present with gallbladder distention. Consider dedicated hepatobiliary limited ultrasound for further characterization. 2. No bowel abnormality is identified. 3. Lumbar impingement at L4-5 and L5-S1. Another notable feature is what appears to be a large disc extrusion extending caudad in the right lateral recess from L3-4. 4. Diffuse hepatic steatosis. 5.  Aortic Atherosclerosis (ICD10-I70.0). 6. Mildly prominent  prostate gland with some left anterior convexity-possibly benign but prostate cancer not excluded. Correlate with PSA level and prostate history. 7. Old granulomatous disease. Electronically Signed   By: Van Clines M.D.   On: 05/08/2019 17:40   US Abdomen Limited Ruq  Result Date: 05/08/2019 CLINICAL DATA:  Epigastric pain EXAM: ULTRASOUND ABDOMEN LIMITED RIGHT UPPER QUADRANT COMPARISON:  None. FINDINGS: Gallbladder: Gallbladder is hydropic and filled with sludge. A negative sonographic Percell Miller sign was reported by the sonographer. No wall thickening or pericholecystic fluid. Common bile duct: Diameter: 5 mm Liver: Hyperechoic liver parenchyma. No focal lesion. Portal vein is patent on color Doppler imaging with normal direction of blood flow towards the liver. IMPRESSION: 1. Hydropic gallbladder with gallbladder sludge. No other evidence of acute cholecystitis. 2. Hepatic steatosis. Electronically Signed   By: Ulyses Jarred M.D.   On: 05/08/2019 20:02     ____________________________________________   PROCEDURES  Procedure(s) performed: None Procedures Critical Care performed:   None ____________________________________________   INITIAL IMPRESSION / ASSESSMENT AND PLAN / ED COURSE   78 y.o. male with a history of diabetes, COPD, hypertension, hyperlipidemia, GERD who presents for evaluation of abdominal pain. CT as outpatient showing distended gallbladder. Patient looks uncomfortable, normal vital signs, abdomen is obese with tenderness to palpation in the epigastric and right upper quadrant, negative Murphy sign.  Differential diagnosis including pancreatitis versus cholelithiasis versus cholecystitis versus GERD versus peptic ulcer disease versus gastritis.  Will treat symptoms with fentanyl and Zofran.  Labs and right upper quadrant ultrasound pending.  EKG with no ischemic changes.    _________________________ 9:17 PM on 05/08/2019 -----------------------------------------  Labs showing no leukocytosis, normal lipase and LFTs, normal T bili.  Ultrasound showing hydropic gallbladder with sludge but no evidence of acute cholecystitis.  After fentanyl and Toradol patient is still in substantial pain.  Discussed with Dr. Dahlia Byes from surgery who recommended admission for lap chole in the morning.  He also recommended giving antibiotics.  Will re-dose pain medication with morphine.  Discussed plan with patient who is in agreement.   As part of my medical decision making, I reviewed the following data within the [place] notes reviewed and incorporated, Labs reviewed , EKG interpreted , Old EKG reviewed, Old chart reviewed, Radiograph reviewed , A consult was requested and obtained from this/these consultant(s) Surgery, Notes from prior ED visits and [place] Controlled Substance Database    Pertinent labs & imaging results that were available during my care of the patient were reviewed by me and considered in my medical decision making (see chart for details).    ____________________________________________   FINAL CLINICAL IMPRESSION(S) / ED  DIAGNOSES  Final diagnoses:  Epigastric abdominal pain  Gallbladder hydrops      NEW MEDICATIONS STARTED DURING THIS VISIT:  ED Discharge Orders    None       Note:  This document was prepared using Dragon voice recognition software and may include unintentional dictation errors.    Rudene Re, MD 05/08/19 2118

## 2019-05-08 NOTE — ED Notes (Signed)
ED TO INPATIENT HANDOFF REPORT  ED Nurse Name and Phone #: Levie Owensby [phone]  S Name/Age/Gender Brett Blake 78 y.o. male Room/Bed: ED26A/ED26A  Code Status   Code Status: Full Code  Home/SNF/Other Home Patient oriented to: self, place, time and situation Is this baseline? Yes   Triage Complete: Triage complete  Chief Complaint Abd pain  Triage Note Pt presents w/ c/o epigastric pain starting this morning. Pt had outpt CT and has enlarged gallbladder and was sent by PCP to the ED.   Allergies Allergies  Allergen Reactions  . Ace Inhibitors Cough  . Sulfa Antibiotics Rash    Level of Care/Admitting Diagnosis ED Disposition    ED Disposition Condition Comment   Admit  Hospital Area: [facility] [[id number]]  Level of Care: Med-Surg [16]  Covid Evaluation: N/A  Diagnosis: Cholecystitis [[id number]]  Admitting Physician: Jules Husbands [[id number]]  Attending Physician: Jules Husbands [[id number]]  Estimated length of stay: 3 - 4 days  Certification:: I certify this patient will need inpatient services for at least 2 midnights  PT Class (Do Not Modify): Inpatient [101]  PT Acc Code (Do Not Modify): Private [1]       B Medical/Surgery History Past Medical History:  Diagnosis Date  . Anxiety   . Arthritis   . Asthma   . Colon polyps   . COPD (chronic obstructive pulmonary disease) ([place])   . Depression   . Diabetes ([place])   . GERD (gastroesophageal reflux disease)   . High cholesterol   . Hyperlipidemia   . Hypertension   . Sleep apnea    Past Surgical History:  Procedure Laterality Date  . BRONCHOSCOPY    . CARDIAC CATHETERIZATION Left 11/04/2016   Procedure: Left Heart Cath and Coronary Angiography;  Surgeon: Teodoro Spray, MD;  Location: [facility];  Service: Cardiovascular;  Laterality: Left;  . CARDIAC CATHETERIZATION    . COLOSTOMY    . ROTATOR CUFF REPAIR Left   . THUMB ARTHROSCOPY  2015  . TONSILLECTOMY  1952  . TONSILLECTOMY        A IV Location/Drains/Wounds Patient Lines/Drains/Airways Status   Active Line/Drains/Airways    Name:   Placement date:   Placement time:   Site:   Days:   Peripheral IV 05/08/19 Left Wrist   05/08/19    1844    Wrist   less than 1          Intake/Output Last 24 hours No intake or output data in the 24 hours ending 05/08/19 2142  Labs/Imaging Results for orders placed or performed during the hospital encounter of 05/08/19 (from the past 48 hour(s))  Lipase, blood     Status: None   Collection Time: 05/08/19  6:42 PM  Result Value Ref Range   Lipase 48 11 - 51 U/L    Comment: Performed at [facility], [address] [place], [place] [address]  Comprehensive metabolic panel     Status: Abnormal   Collection Time: 05/08/19  6:42 PM  Result Value Ref Range   Sodium 134 (L) 135 - 145 mmol/L   Potassium 3.9 3.5 - 5.1 mmol/L   Chloride 94 (L) 98 - 111 mmol/L   CO2 26 22 - 32 mmol/L   Glucose, Bld 112 (H) 70 - 99 mg/dL   BUN 17 8 - 23 mg/dL   Creatinine, Ser 1.16 0.61 - 1.24 mg/dL   Calcium 9.2 8.9 - 10.3 mg/dL   Total Protein 7.5  6.5 - 8.1 g/dL   Albumin 4.2 3.5 - 5.0 g/dL   AST 25 15 - 41 U/L   ALT 33 0 - 44 U/L   Alkaline Phosphatase 66 38 - 126 U/L   Total Bilirubin 0.6 0.3 - 1.2 mg/dL   GFR calc non Af Amer >60 >60 mL/min   GFR calc Af Amer >60 >60 mL/min   Anion gap 14 5 - 15    Comment: Performed at [facility], [address] [place], [place] [address]  CBC     Status: None   Collection Time: 05/08/19  6:42 PM  Result Value Ref Range   WBC 8.9 4.0 - 10.5 K/uL   RBC 4.48 4.22 - 5.81 MIL/uL   Hemoglobin 14.2 13.0 - 17.0 g/dL   HCT 41.9 39.0 - 52.0 %   MCV 93.5 80.0 - 100.0 fL   MCH 31.7 26.0 - 34.0 pg   MCHC 33.9 30.0 - 36.0 g/dL   RDW 13.7 11.5 - 15.5 %   Platelets 264 150 - 400 K/uL   nRBC 0.0 0.0 - 0.2 %    Comment: Performed at [facility], [address] [place], [place] [address]  Urinalysis, Complete w Microscopic      Status: Abnormal   Collection Time: 05/08/19  6:42 PM  Result Value Ref Range   Color, Urine YELLOW (A) YELLOW   APPearance CLEAR (A) CLEAR   Specific Gravity, Urine >1.046 (H) 1.005 - 1.030   pH 6.0 5.0 - 8.0   Glucose, UA NEGATIVE NEGATIVE mg/dL   Hgb urine dipstick NEGATIVE NEGATIVE   Bilirubin Urine NEGATIVE NEGATIVE   Ketones, ur 5 (A) NEGATIVE mg/dL   Protein, ur NEGATIVE NEGATIVE mg/dL   Nitrite NEGATIVE NEGATIVE   Leukocytes,Ua NEGATIVE NEGATIVE   RBC / HPF 0-5 0 - 5 RBC/hpf   WBC, UA 0-5 0 - 5 WBC/hpf   Bacteria, UA NONE SEEN NONE SEEN   Squamous Epithelial / LPF NONE SEEN 0 - 5    Comment: Performed at [facility], [address] [place], [place] [address]   Ct Abdomen Pelvis W Contrast  Result Date: 05/08/2019 CLINICAL DATA:  Abdominal pain with nausea vomiting starting this morning. Diarrhea yesterday. EXAM: CT ABDOMEN AND PELVIS WITH CONTRAST TECHNIQUE: Multidetector CT imaging of the abdomen and pelvis was performed using the standard protocol following bolus administration of intravenous contrast. CONTRAST:  132mL OMNIPAQUE IOHEXOL 300 MG/ML  SOLN COMPARISON:  08/20/2015 FINDINGS: Lower chest:A mostly calcified 0.8 by 0.6 cm nodule in the left lower lobe is most compatible with a calcified granuloma. Hepatobiliary: Hepatic steatosis. Mildly distended gallbladder, transverse diameter 6.6 cm. Punctate calcification inferiorly in the right hepatic lobe. No bile duct dilatation. Pancreas: Unremarkable Spleen: Punctate calcifications in the spleen compatible with old granulomatous disease. Adrenals/Urinary Tract: Unremarkable Stomach/Bowel: Unremarkable. The appendix is not well seen but no right lower quadrant inflammatory process is identified. Vascular/Lymphatic: Mild aortoiliac atherosclerotic vascular calcification. No pathologic adenopathy. Reproductive: The prostate gland measures 5.5 by 4.0 by 4.1 cm, and has some mild left anterior convexity. Other: No  supplemental non-categorized findings. Musculoskeletal: Lower lumbar spondylosis and degenerative disc disease leading to right foraminal impingement at L4-5 and L5-S1, and left foraminal impingement at L5-S1. There is potentially central narrowing of the thecal sac at L4-5 and also a suspected right lateral recess disc extrusion from L3-4 extending caudad much of the way down towards the L4-5 intervertebral space. IMPRESSION: 1. Distended gallbladder at 6.6 cm transverse. Occasionally  cholecystitis can present with gallbladder distention. Consider dedicated hepatobiliary limited ultrasound for further characterization. 2. No bowel abnormality is identified. 3. Lumbar impingement at L4-5 and L5-S1. Another notable feature is what appears to be a large disc extrusion extending caudad in the right lateral recess from L3-4. 4. Diffuse hepatic steatosis. 5.  Aortic Atherosclerosis (ICD10-I70.0). 6. Mildly prominent prostate gland with some left anterior convexity-possibly benign but prostate cancer not excluded. Correlate with PSA level and prostate history. 7. Old granulomatous disease. Electronically Signed   By: Van Clines M.D.   On: 05/08/2019 17:40   US Abdomen Limited Ruq  Result Date: 05/08/2019 CLINICAL DATA:  Epigastric pain EXAM: ULTRASOUND ABDOMEN LIMITED RIGHT UPPER QUADRANT COMPARISON:  None. FINDINGS: Gallbladder: Gallbladder is hydropic and filled with sludge. A negative sonographic Percell Miller sign was reported by the sonographer. No wall thickening or pericholecystic fluid. Common bile duct: Diameter: 5 mm Liver: Hyperechoic liver parenchyma. No focal lesion. Portal vein is patent on color Doppler imaging with normal direction of blood flow towards the liver. IMPRESSION: 1. Hydropic gallbladder with gallbladder sludge. No other evidence of acute cholecystitis. 2. Hepatic steatosis. Electronically Signed   By: Ulyses Jarred M.D.   On: 05/08/2019 20:02    Pending Labs Unresulted Labs (From  admission, onward)    Start     Ordered   05/09/19 0500  Comprehensive metabolic panel  Tomorrow morning,   STAT     05/08/19 2127   05/08/19 2132  SARS Coronavirus 2 (CEPHEID - Performed in [facility] hospital lab), Hosp Order  (Asymptomatic Patients Labs)  Once,   STAT    Question:  Rule Out  Answer:  Yes   05/08/19 2131   05/08/19 1844  Troponin I - ONCE - STAT  ONCE - STAT,   STAT     05/08/19 1844          Vitals/Pain Today's Vitals   05/08/19 1829 05/08/19 1830 05/08/19 2010 05/08/19 2047  BP:    (!) 151/95  Pulse:    94  Resp:    (!) 24  Temp:      TempSrc:      SpO2:    97%  Weight:  86.2 kg    Height:  5\' 6"  (1.676 m)    PainSc: 9   5      Isolation Precautions No active isolations  Medications Medications  sodium chloride flush (NS) 0.9 % injection 3 mL (has no administration in time range)  ondansetron (ZOFRAN) injection 4 mg (has no administration in time range)  piperacillin-tazobactam (ZOSYN) IVPB 3.375 g (3.375 g Intravenous New Bag/Given 05/08/19 2129)  heparin injection 5,000 Units (has no administration in time range)  0.9 %  sodium chloride infusion (has no administration in time range)  piperacillin-tazobactam (ZOSYN) IVPB 3.375 g (has no administration in time range)  acetaminophen (TYLENOL) tablet 1,000 mg (has no administration in time range)  ketorolac (TORADOL) 30 MG/ML injection 15 mg (has no administration in time range)  HYDROmorphone (DILAUDID) injection 0.5-1 mg (has no administration in time range)  ondansetron (ZOFRAN-ODT) disintegrating tablet 4 mg (has no administration in time range)    Or  ondansetron (ZOFRAN) injection 4 mg (has no administration in time range)  prochlorperazine (COMPAZINE) tablet 10 mg (has no administration in time range)    Or  prochlorperazine (COMPAZINE) injection 5-10 mg (has no administration in time range)  pantoprazole (PROTONIX) injection 40 mg (has no administration in time range)  hydrALAZINE  (APRESOLINE) injection 10 mg (  has no administration in time range)  fentaNYL (SUBLIMAZE) injection 50 mcg (50 mcg Intravenous Given 05/08/19 1921)  ondansetron (ZOFRAN) injection 4 mg (4 mg Intravenous Given 05/08/19 1920)  ketorolac (TORADOL) 30 MG/ML injection 15 mg (15 mg Intravenous Given 05/08/19 2051)  morphine 4 MG/ML injection 4 mg (4 mg Intravenous Given 05/08/19 2119)  sodium chloride 0.9 % bolus 1,000 mL (1,000 mLs Intravenous New Bag/Given 05/08/19 2130)    Mobility walks Low fall risk   Focused Assessments Cardiac Assessment Handoff:    Lab Results  Component Value Date   TROPONINI <0.03 02/05/2019   No results found for: DDIMER Does the Patient currently have chest pain? No  , NSR/ST   R Recommendations: See Admitting Provider Note  Report given to:   Additional Notes:

## 2019-05-08 NOTE — ED Notes (Signed)
PT spoke to wife and updated her

## 2019-05-08 NOTE — Progress Notes (Signed)
Pt d/w Dr. Alfred Levins, images pers. Reviewed c/w acute cholecystitis. He has intractable pain requiring admission. Plan for admission a/bs and lap chole tomorrow depending on OR schedule.

## 2019-05-09 ENCOUNTER — Inpatient Hospital Stay: Payer: PPO

## 2019-05-09 ENCOUNTER — Inpatient Hospital Stay: Payer: PPO | Admitting: Anesthesiology

## 2019-05-09 ENCOUNTER — Encounter
Admission: EM | Disposition: A | Discharge status: Home / Self Care | Payer: Self-pay | Source: Ambulatory Visit | Attending: Surgery

## 2019-05-09 DIAGNOSIS — K819 Cholecystitis, unspecified: Secondary | ICD-10-CM

## 2019-05-09 HISTORY — PX: CHOLECYSTECTOMY: SHX55

## 2019-05-09 LAB — SURGICAL PCR SCREEN
MRSA, PCR: NEGATIVE
Staphylococcus aureus: NEGATIVE

## 2019-05-09 LAB — COMPREHENSIVE METABOLIC PANEL
ALT: 232 U/L — ABNORMAL HIGH (ref 0–44)
AST: 395 U/L — ABNORMAL HIGH (ref 15–41)
Albumin: 3.4 g/dL — ABNORMAL LOW (ref 3.5–5.0)
Alkaline Phosphatase: 109 U/L (ref 38–126)
Anion gap: 11 (ref 5–15)
BUN: 17 mg/dL (ref 8–23)
CO2: 26 mmol/L (ref 22–32)
Calcium: 8.4 mg/dL — ABNORMAL LOW (ref 8.9–10.3)
Chloride: 100 mmol/L (ref 98–111)
Creatinine, Ser: 1.18 mg/dL (ref 0.61–1.24)
GFR calc Af Amer: >60 mL/min (ref >60)
GFR calc non Af Amer: 60 mL/min — ABNORMAL LOW (ref >60)
Glucose, Bld: 118 mg/dL — ABNORMAL HIGH (ref 70–99)
Potassium: 3.6 mmol/L (ref 3.5–5.1)
Sodium: 137 mmol/L (ref 135–145)
Total Bilirubin: 1 mg/dL (ref 0.3–1.2)
Total Protein: 6.2 g/dL — ABNORMAL LOW (ref 6.5–8.1)

## 2019-05-09 LAB — HEMOGLOBIN A1C
Hgb A1c MFr Bld: 5.9 % — ABNORMAL HIGH (ref 4.8–5.6)
Mean Plasma Glucose: 122.63 mg/dL

## 2019-05-09 LAB — GLUCOSE, CAPILLARY
Glucose-Capillary: 109 mg/dL — ABNORMAL HIGH (ref 70–99)
Glucose-Capillary: 151 mg/dL — ABNORMAL HIGH (ref 70–99)
Glucose-Capillary: 161 mg/dL — ABNORMAL HIGH (ref 70–99)
Glucose-Capillary: 328 mg/dL — ABNORMAL HIGH (ref 70–99)

## 2019-05-09 SURGERY — LAPAROSCOPIC CHOLECYSTECTOMY WITH INTRAOPERATIVE CHOLANGIOGRAM
Anesthesia: General

## 2019-05-09 MED ORDER — SUCCINYLCHOLINE CHLORIDE 20 MG/ML IJ SOLN
INTRAMUSCULAR | Status: AC
  Filled 2019-05-09: qty 1

## 2019-05-09 MED ORDER — OXYCODONE-ACETAMINOPHEN 5-325 MG PO TABS: 1.0000 | ORAL_TABLET | ORAL | Status: DC | PRN

## 2019-05-09 MED ORDER — PROPOFOL 10 MG/ML IV BOLUS
INTRAVENOUS | Status: DC | PRN
  Administered 2019-05-09: 120 mg via INTRAVENOUS
  Administered 2019-05-09: 30 mg via INTRAVENOUS

## 2019-05-09 MED ORDER — ROCURONIUM BROMIDE 50 MG/5ML IV SOLN
INTRAVENOUS | Status: AC
  Filled 2019-05-09: qty 1

## 2019-05-09 MED ORDER — ACETAMINOPHEN 10 MG/ML IV SOLN
INTRAVENOUS | Status: AC
  Filled 2019-05-09: qty 100

## 2019-05-09 MED ORDER — PROPOFOL 10 MG/ML IV BOLUS
INTRAVENOUS | Status: AC
  Filled 2019-05-09: qty 20

## 2019-05-09 MED ORDER — ACETAMINOPHEN 10 MG/ML IV SOLN
INTRAVENOUS | Status: DC | PRN
  Administered 2019-05-09: 1000 mg via INTRAVENOUS

## 2019-05-09 MED ORDER — IOPAMIDOL (ISOVUE-300) INJECTION 61%
INTRAVENOUS | Status: DC | PRN
  Administered 2019-05-09: 13:00:00 10 mL

## 2019-05-09 MED ORDER — DILTIAZEM HCL ER COATED BEADS 180 MG PO CP24
180.0000 mg | ORAL_CAPSULE | Freq: Two times a day (BID) | ORAL | Status: DC
  Administered 2019-05-09 – 2019-05-11 (×4): 180 mg via ORAL
  Filled 2019-05-09 (×5): qty 1

## 2019-05-09 MED ORDER — LIDOCAINE HCL (PF) 2 % IJ SOLN
INTRAMUSCULAR | Status: AC
  Filled 2019-05-09: qty 10

## 2019-05-09 MED ORDER — CARVEDILOL 12.5 MG PO TABS
12.5000 mg | ORAL_TABLET | Freq: Two times a day (BID) | ORAL | Status: DC
  Administered 2019-05-09 – 2019-05-11 (×4): 12.5 mg via ORAL
  Filled 2019-05-09 (×4): qty 1

## 2019-05-09 MED ORDER — GLYCOPYRROLATE 0.2 MG/ML IJ SOLN
INTRAMUSCULAR | Status: AC
  Filled 2019-05-09: qty 1

## 2019-05-09 MED ORDER — PROPOFOL 500 MG/50ML IV EMUL
INTRAVENOUS | Status: DC | PRN
  Administered 2019-05-09: 150 ug/kg/min via INTRAVENOUS

## 2019-05-09 MED ORDER — IPRATROPIUM-ALBUTEROL 0.5-2.5 (3) MG/3ML IN SOLN
RESPIRATORY_TRACT | Status: AC
  Administered 2019-05-09: 11:00:00 3 mL via RESPIRATORY_TRACT
  Filled 2019-05-09: qty 3

## 2019-05-09 MED ORDER — HYDROMORPHONE HCL 1 MG/ML IJ SOLN
0.2500 mg | INTRAMUSCULAR | Status: DC | PRN
  Administered 2019-05-09 (×2): 0.25 mg via INTRAVENOUS

## 2019-05-09 MED ORDER — PANTOPRAZOLE SODIUM 40 MG PO TBEC
40.0000 mg | DELAYED_RELEASE_TABLET | Freq: Every day | ORAL | Status: DC
  Administered 2019-05-09 – 2019-05-11 (×3): 40 mg via ORAL
  Filled 2019-05-09 (×3): qty 1

## 2019-05-09 MED ORDER — HYDROMORPHONE HCL 1 MG/ML IJ SOLN
INTRAMUSCULAR | Status: AC
  Administered 2019-05-09: 14:00:00 0.25 mg via INTRAVENOUS
  Filled 2019-05-09: qty 1

## 2019-05-09 MED ORDER — FENTANYL CITRATE (PF) 100 MCG/2ML IJ SOLN
INTRAMUSCULAR | Status: DC | PRN
  Administered 2019-05-09: 100 ug via INTRAVENOUS

## 2019-05-09 MED ORDER — PHENYLEPHRINE HCL (PRESSORS) 10 MG/ML IV SOLN
INTRAVENOUS | Status: DC | PRN
  Administered 2019-05-09 (×3): 100 ug via INTRAVENOUS

## 2019-05-09 MED ORDER — FENTANYL CITRATE (PF) 100 MCG/2ML IJ SOLN
INTRAMUSCULAR | Status: AC
  Filled 2019-05-09: qty 2

## 2019-05-09 MED ORDER — ALPRAZOLAM 0.25 MG PO TABS: 0.2500 mg | ORAL_TABLET | Freq: Three times a day (TID) | ORAL | Status: DC | PRN

## 2019-05-09 MED ORDER — INSULIN ASPART 100 UNIT/ML ~~LOC~~ SOLN
0.0000 [IU] | Freq: Every day | SUBCUTANEOUS | Status: DC
  Administered 2019-05-09: 4 [IU] via SUBCUTANEOUS
  Filled 2019-05-09: qty 1

## 2019-05-09 MED ORDER — ROCURONIUM BROMIDE 100 MG/10ML IV SOLN
INTRAVENOUS | Status: DC | PRN
  Administered 2019-05-09: 10 mg via INTRAVENOUS
  Administered 2019-05-09: 30 mg via INTRAVENOUS

## 2019-05-09 MED ORDER — PROPOFOL 500 MG/50ML IV EMUL
INTRAVENOUS | Status: AC
  Filled 2019-05-09: qty 50

## 2019-05-09 MED ORDER — MONTELUKAST SODIUM 10 MG PO TABS
10.0000 mg | ORAL_TABLET | Freq: Every day | ORAL | Status: DC
  Administered 2019-05-09 – 2019-05-10 (×2): 10 mg via ORAL
  Filled 2019-05-09 (×2): qty 1

## 2019-05-09 MED ORDER — OXYCODONE HCL 5 MG PO TABS
5.0000 mg | ORAL_TABLET | ORAL | Status: DC | PRN
  Administered 2019-05-10: 5 mg via ORAL
  Filled 2019-05-09: qty 1

## 2019-05-09 MED ORDER — ONDANSETRON HCL 4 MG/2ML IJ SOLN
INTRAMUSCULAR | Status: AC
  Filled 2019-05-09: qty 2

## 2019-05-09 MED ORDER — SUCCINYLCHOLINE CHLORIDE 20 MG/ML IJ SOLN
INTRAMUSCULAR | Status: DC | PRN
  Administered 2019-05-09: 140 mg via INTRAVENOUS

## 2019-05-09 MED ORDER — INSULIN ASPART 100 UNIT/ML ~~LOC~~ SOLN
4.0000 [IU] | Freq: Three times a day (TID) | SUBCUTANEOUS | Status: DC
  Administered 2019-05-10 – 2019-05-11 (×4): 4 [IU] via SUBCUTANEOUS
  Filled 2019-05-09 (×4): qty 1

## 2019-05-09 MED ORDER — PROMETHAZINE HCL 25 MG/ML IJ SOLN: 6.2500 mg | INTRAMUSCULAR | Status: DC | PRN

## 2019-05-09 MED ORDER — IPRATROPIUM-ALBUTEROL 0.5-2.5 (3) MG/3ML IN SOLN: 3.0000 mL | Freq: Four times a day (QID) | RESPIRATORY_TRACT | Status: DC | PRN

## 2019-05-09 MED ORDER — DEXAMETHASONE SODIUM PHOSPHATE 10 MG/ML IJ SOLN
INTRAMUSCULAR | Status: DC | PRN
  Administered 2019-05-09: 10 mg via INTRAVENOUS

## 2019-05-09 MED ORDER — METOPROLOL SUCCINATE ER 50 MG PO TB24
50.0000 mg | ORAL_TABLET | Freq: Two times a day (BID) | ORAL | Status: DC
  Administered 2019-05-09 – 2019-05-10 (×2): 50 mg via ORAL
  Filled 2019-05-09 (×2): qty 1

## 2019-05-09 MED ORDER — NITROGLYCERIN 0.4 MG SL SUBL: 0.4000 mg | SUBLINGUAL_TABLET | SUBLINGUAL | Status: DC | PRN

## 2019-05-09 MED ORDER — LIDOCAINE HCL (CARDIAC) PF 100 MG/5ML IV SOSY
PREFILLED_SYRINGE | INTRAVENOUS | Status: DC | PRN
  Administered 2019-05-09: 100 mg via INTRAVENOUS

## 2019-05-09 MED ORDER — INSULIN ASPART 100 UNIT/ML ~~LOC~~ SOLN
0.0000 [IU] | Freq: Three times a day (TID) | SUBCUTANEOUS | Status: DC
  Administered 2019-05-09: 4 [IU] via SUBCUTANEOUS
  Administered 2019-05-10 (×2): 7 [IU] via SUBCUTANEOUS
  Administered 2019-05-10: 3 [IU] via SUBCUTANEOUS
  Administered 2019-05-11: 09:00:00 4 [IU] via SUBCUTANEOUS
  Filled 2019-05-09 (×5): qty 1

## 2019-05-09 MED ORDER — IPRATROPIUM-ALBUTEROL 0.5-2.5 (3) MG/3ML IN SOLN
3.0000 mL | Freq: Four times a day (QID) | RESPIRATORY_TRACT | Status: DC
  Administered 2019-05-09 (×2): 3 mL via RESPIRATORY_TRACT
  Filled 2019-05-09: qty 3

## 2019-05-09 MED ORDER — FLUTICASONE PROPIONATE 50 MCG/ACT NA SUSP
1.0000 | Freq: Every day | NASAL | Status: DC | PRN
  Administered 2019-05-10: 23:00:00 1 via NASAL
  Filled 2019-05-09 (×2): qty 16

## 2019-05-09 MED ORDER — SUGAMMADEX SODIUM 200 MG/2ML IV SOLN
INTRAVENOUS | Status: DC | PRN
  Administered 2019-05-09: 200 mg via INTRAVENOUS

## 2019-05-09 MED ORDER — EVICEL 5 ML EX KIT
PACK | CUTANEOUS | Status: DC | PRN
  Administered 2019-05-09: 5 mL

## 2019-05-09 MED ORDER — ZOLPIDEM TARTRATE 5 MG PO TABS
5.0000 mg | ORAL_TABLET | Freq: Every day | ORAL | Status: DC
  Administered 2019-05-10 (×2): 5 mg via ORAL
  Filled 2019-05-09 (×2): qty 1

## 2019-05-09 MED ORDER — BUPIVACAINE-EPINEPHRINE 0.25% -1:200000 IJ SOLN
INTRAMUSCULAR | Status: DC | PRN
  Administered 2019-05-09: 30 mL

## 2019-05-09 MED ORDER — DEXAMETHASONE SODIUM PHOSPHATE 10 MG/ML IJ SOLN
INTRAMUSCULAR | Status: AC
  Filled 2019-05-09: qty 1

## 2019-05-09 SURGICAL SUPPLY — 55 items
APPLICATOR COTTON TIP 6 STRL (MISCELLANEOUS) ×1 IMPLANT
APPLICATOR COTTON TIP 6IN STRL (MISCELLANEOUS) ×3
APPLIER CLIP 5 13 M/L LIGAMAX5 (MISCELLANEOUS) ×3
BLADE CLIPPER SURG (BLADE) ×3 IMPLANT
BLADE SURG 15 STRL LF DISP TIS (BLADE) ×1 IMPLANT
BLADE SURG 15 STRL SS (BLADE) ×2
BULB RESERV EVAC DRAIN JP 100C (MISCELLANEOUS) ×3 IMPLANT
CANISTER SUCT 1200ML W/VALVE (MISCELLANEOUS) ×3 IMPLANT
CANISTER SUCT 3000ML PPV (MISCELLANEOUS) ×3 IMPLANT
CHLORAPREP W/TINT 26 (MISCELLANEOUS) ×3 IMPLANT
CHOLANGIOGRAM CATH TAUT (CATHETERS) ×3 IMPLANT
CLEANER CAUTERY TIP 5X5 PAD (MISCELLANEOUS) IMPLANT
CLIP APPLIE 5 13 M/L LIGAMAX5 (MISCELLANEOUS) ×1 IMPLANT
COVER WAND RF STERILE (DRAPES) ×3 IMPLANT
DECANTER SPIKE VIAL GLASS SM (MISCELLANEOUS) ×6 IMPLANT
DERMABOND ADVANCED (GAUZE/BANDAGES/DRESSINGS) ×2
DERMABOND ADVANCED .7 DNX12 (GAUZE/BANDAGES/DRESSINGS) ×1 IMPLANT
DRAIN CHANNEL JP 15F RND 16 (MISCELLANEOUS) ×3 IMPLANT
DRAPE C-ARM XRAY 36X54 (DRAPES) ×6 IMPLANT
ELECT CAUTERY BLADE 6.4 (BLADE) ×3 IMPLANT
ELECT REM PT RETURN 9FT ADLT (ELECTROSURGICAL) ×3
ELECTRODE REM PT RTRN 9FT ADLT (ELECTROSURGICAL) ×1 IMPLANT
GLOVE BIO SURGEON STRL SZ7 (GLOVE) ×3 IMPLANT
GOWN STRL REUS W/ TWL LRG LVL3 (GOWN DISPOSABLE) ×3 IMPLANT
GOWN STRL REUS W/TWL LRG LVL3 (GOWN DISPOSABLE) ×6
IRRIGATION STRYKERFLOW (MISCELLANEOUS) ×1 IMPLANT
IRRIGATOR STRYKERFLOW (MISCELLANEOUS) ×3
IV CATH ANGIO 12GX3 LT BLUE (NEEDLE) ×3 IMPLANT
IV NS 1000ML (IV SOLUTION) ×4
IV NS 1000ML BAXH (IV SOLUTION) ×2 IMPLANT
L-HOOK LAP DISP 36CM (ELECTROSURGICAL) ×3
LHOOK LAP DISP 36CM (ELECTROSURGICAL) ×1 IMPLANT
NEEDLE HYPO 22GX1.5 SAFETY (NEEDLE) ×3 IMPLANT
NS IRRIG 1000ML POUR BTL (IV SOLUTION) ×3 IMPLANT
PACK LAP CHOLECYSTECTOMY (MISCELLANEOUS) ×3 IMPLANT
PAD CLEANER CAUTERY TIP 5X5 (MISCELLANEOUS)
PENCIL ELECTRO HAND CTR (MISCELLANEOUS) ×3 IMPLANT
POUCH SPECIMEN RETRIEVAL 10MM (ENDOMECHANICALS) ×3 IMPLANT
SCISSORS METZENBAUM CVD 33 (INSTRUMENTS) ×3 IMPLANT
SET TUBE SMOKE EVAC HIGH FLOW (TUBING) ×3 IMPLANT
SLEEVE ENDOPATH XCEL 5M (ENDOMECHANICALS) ×9 IMPLANT
SOL ANTI-FOG 6CC FOG-OUT (MISCELLANEOUS) ×1 IMPLANT
SOL FOG-OUT ANTI-FOG 6CC (MISCELLANEOUS) ×2
SPONGE DRAIN TRACH 4X4 STRL 2S (GAUZE/BANDAGES/DRESSINGS) ×3 IMPLANT
SPONGE LAP 18X18 RF (DISPOSABLE) ×3 IMPLANT
STOPCOCK 4 WAY LG BORE MALE ST (IV SETS) ×3 IMPLANT
SUT ETHIBOND 0 MO6 C/R (SUTURE) IMPLANT
SUT ETHILON 3-0 FS-10 30 BLK (SUTURE) ×3
SUT MNCRL AB 4-0 PS2 18 (SUTURE) ×3 IMPLANT
SUT VICRYL 0 AB UR-6 (SUTURE) ×12 IMPLANT
SUTURE EHLN 3-0 FS-10 30 BLK (SUTURE) ×1 IMPLANT
SYR 20CC LL (SYRINGE) ×6 IMPLANT
TIP RIGID 35CM EVICEL (HEMOSTASIS) ×3 IMPLANT
TROCAR XCEL BLUNT TIP 100MML (ENDOMECHANICALS) ×3 IMPLANT
TROCAR XCEL NON-BLD 5MMX100MML (ENDOMECHANICALS) ×3 IMPLANT

## 2019-05-09 NOTE — Transfer of Care (Signed)
Immediate Anesthesia Transfer of Care Note  Patient: Brett Blake  Procedure(s) Performed: LAPAROSCOPIC CHOLECYSTECTOMY WITH INTRAOPERATIVE CHOLANGIOGRAM (N/A )  Patient Location: PACU  Anesthesia Type:General  Level of Consciousness: awake, alert  and oriented  Airway & Oxygen Therapy: Patient Spontanous Breathing and Patient connected to face mask oxygen  Post-op Assessment: Report given to RN and Post -op Vital signs reviewed and stable  Post vital signs: Reviewed and stable  Last Vitals:  Vitals Value Taken Time  BP 137/76 05/09/2019  1:25 PM  Temp 36.8 C 05/09/2019  1:25 PM  Pulse 90 05/09/2019  1:25 PM  Resp 15 05/09/2019  1:25 PM  SpO2 100 % 05/09/2019  1:25 PM  Vitals shown include unvalidated device data.  Last Pain:  Vitals:   05/09/19 1325  TempSrc: Temporal  PainSc:          Complications: No apparent anesthesia complications

## 2019-05-09 NOTE — Anesthesia Preprocedure Evaluation (Addendum)
Anesthesia Evaluation  Patient identified by MRN, date of birth, ID band Patient awake    Reviewed: Allergy & Precautions, H&P , NPO status , Patient's Chart, lab work & pertinent test results  History of Anesthesia Complications (+) PONV and history of anesthetic complications  Airway Mallampati: III       Dental  (+) Missing, Chipped   Pulmonary asthma , sleep apnea , COPD, former smoker,           Cardiovascular hypertension, + dysrhythmias Supra Ventricular Tachycardia   Seen in ED in February 2020 for CP/SOB, EKG consistent with previous and troponin negative, pt reports no issues with CP/SOB since then, able to walk 1.5 miles   Neuro/Psych PSYCHIATRIC DISORDERS Anxiety Depression negative neurological ROS     GI/Hepatic Neg liver ROS, GERD  Controlled,Pt reports baseline nausea, believes he has been nauseated after past anesthetics   Endo/Other  diabetes  Renal/GU      Musculoskeletal   Abdominal   Peds  Hematology negative hematology ROS (+)   Anesthesia Other Findings Obesity Large, rotund abdomen  Past Medical History: No date: Anxiety No date: Arthritis No date: Asthma No date: Colon polyps No date: COPD (chronic obstructive pulmonary disease) ([facility]) No date: Depression No date: Diabetes ([facility]) No date: GERD (gastroesophageal reflux disease) No date: High cholesterol No date: Hyperlipidemia No date: Hypertension No date: Sleep apnea  Past Surgical History: No date: BRONCHOSCOPY 11/04/2016: CARDIAC CATHETERIZATION; Left     Comment:  Procedure: Left Heart Cath and Coronary Angiography;                Surgeon: Teodoro Spray, MD;  Location: [facility];  Service: Cardiovascular;  Laterality: Left; No date: CARDIAC CATHETERIZATION No date: COLOSTOMY No date: ROTATOR CUFF REPAIR; Left 2015: THUMB ARTHROSCOPY 1952: TONSILLECTOMY No date: TONSILLECTOMY  BMI    Body Mass  Index:  30.67 kg/m      Reproductive/Obstetrics negative OB ROS                            Anesthesia Physical Anesthesia Plan  ASA: III  Anesthesia Plan: General ETT   Post-op Pain Management:    Induction:   PONV Risk Score and Plan: Dexamethasone, Midazolam and Treatment may vary due to age or medical condition  Airway Management Planned:   Additional Equipment:   Intra-op Plan:   Post-operative Plan:   Informed Consent: I have reviewed the patients History and Physical, chart, labs and discussed the procedure including the risks, benefits and alternatives for the proposed anesthesia with the patient or authorized representative who has indicated his/her understanding and acceptance.     Dental Advisory Given  Plan Discussed with: Anesthesiologist and CRNA  Anesthesia Plan Comments: (Received Zofran at 10am today)       Anesthesia Quick Evaluation

## 2019-05-09 NOTE — Anesthesia Procedure Notes (Signed)
Procedure Name: Intubation Date/Time: 05/09/2019 11:10 AM Performed by: Johnna Acosta, CRNA Pre-anesthesia Checklist: Patient identified, Emergency Drugs available, Suction available, Patient being monitored and Timeout performed Patient Re-evaluated:Patient Re-evaluated prior to induction Oxygen Delivery Method: Circle system utilized Preoxygenation: Pre-oxygenation with 100% oxygen Induction Type: IV induction, Rapid sequence and Cricoid Pressure applied Ventilation: Mask ventilation without difficulty and Oral airway inserted - appropriate to patient size Laryngoscope Size: McGraph, 4, Miller and 2 Grade View: Grade III Tube type: Oral Tube size: 7.5 mm Number of attempts: 2 Airway Equipment and Method: Stylet,  Video-laryngoscopy and Bougie stylet Placement Confirmation: ETT inserted through vocal cords under direct vision,  positive ETCO2 and breath sounds checked- equal and bilateral Secured at: 21 cm Tube secured with: Tape

## 2019-05-09 NOTE — H&P (Signed)
[facility] SURGICAL HISTORY & PHYSICAL (cpt [phone])  HISTORY OF PRESENT ILLNESS (HPI):  78 y.o. male presented to [facility] ED yesterday for abdominal pain. Patient reports the acute onset of dull epigastric abdominal pain the morning of presentation which became severe and constant. He endorses nausea and non-bloody and non-bilious emesis with the abdominal pain. No fever, chills, cough, congestion, SOB, CP, or bladder changes. He denied any history of similar pain in the past. No association with food.. No previous abdominal surgeries. He went to his PCP for this and CT was concerning for "enlarged gallbladder" and he was referred to the ED. Work up in the ED was concerning for acute cholecystitis.   General surgery is consulted by emergency medicine physician Dr Gonzella Lex, MD for evaluation and management of cholecystitis.    PAST MEDICAL HISTORY (PMH):  Past Medical History:  Diagnosis Date  . Anxiety   . Arthritis   . Asthma   . Colon polyps   . COPD (chronic obstructive pulmonary disease) ([place])   . Depression   . Diabetes ([place])   . GERD (gastroesophageal reflux disease)   . High cholesterol   . Hyperlipidemia   . Hypertension   . Sleep apnea     Reviewed. Otherwise negative.   PAST SURGICAL HISTORY ([place]):  Past Surgical History:  Procedure Laterality Date  . BRONCHOSCOPY    . CARDIAC CATHETERIZATION Left 11/04/2016   Procedure: Left Heart Cath and Coronary Angiography;  Surgeon: Teodoro Spray, MD;  Location: [facility];  Service: Cardiovascular;  Laterality: Left;  . CARDIAC CATHETERIZATION    . COLOSTOMY    . ROTATOR CUFF REPAIR Left   . THUMB ARTHROSCOPY  2015  . TONSILLECTOMY  1952  . TONSILLECTOMY      Reviewed. Otherwise negative.   MEDICATIONS:  Prior to Admission medications   Medication Sig Start Date End Date Taking? Authorizing Provider  ALPRAZolam Duanne Moron) 0.5 MG tablet Take 0.5 tablets by mouth daily as needed. 03/05/19  Yes  [provider]  ascorbic acid (VITAMIN C) 1000 MG tablet Take 1,000 mg by mouth daily.   Yes [provider]  aspirin 81 MG chewable tablet Chew 81 mg by mouth daily.   Yes [provider]  carvedilol (COREG) 12.5 MG tablet Take 12.5 mg by mouth 2 (two) times a day.  04/03/19  Yes [provider]  diltiazem (CARDIZEM CD) 180 MG 24 hr capsule Take 1 capsule by mouth 2 (two) times a day. 05/02/19  Yes [provider]  esomeprazole (NEXIUM) 40 MG capsule Take 40 mg by mouth daily at 12 noon.   Yes [provider]  fluticasone (FLONASE) 50 MCG/ACT nasal spray Place 1 spray into both nostrils daily as needed for allergies or rhinitis.   Yes [provider]  glimepiride (AMARYL) 1 MG tablet Take 1 mg by mouth daily.    Yes [provider]  glucosamine-chondroitin 500-400 MG tablet Take 1 tablet by mouth daily.   Yes [provider]  montelukast (SINGULAIR) 10 MG tablet Take 10 mg by mouth at bedtime.   Yes [provider]  Multiple Vitamins-Minerals (MULTIVITAMIN PO) Take 1 tablet by mouth daily.   Yes [provider]  simvastatin (ZOCOR) 20 MG tablet Take 20 mg by mouth daily.   Yes [provider]  triamterene-hydrochlorothiazide (DYAZIDE) 37.5-25 MG per capsule Take 1 capsule by mouth daily.   Yes [provider]  zolpidem (AMBIEN) 5 MG tablet Take 1 tablet  by mouth at bedtime. 04/20/19  Yes [provider]  albuterol (ACCUNEB) 1.25 MG/3ML nebulizer solution TAKE 3 MLS (1.25 MG TOTAL) BY NEBULIZATION EVERY 6 (SIX) HOURS AS NEEDED FOR WHEEZING 04/08/19   [provider]  amLODipine (NORVASC) 5 MG tablet Take 5 mg by mouth daily. 03/24/17   [provider]  ibuprofen (ADVIL,MOTRIN) 200 MG tablet Take 200 mg by mouth every 6 (six) hours as needed.    [provider]  metoprolol succinate (TOPROL-XL) 50 MG 24 hr tablet Take 1 tablet by mouth 2 (two) times a day.  04/03/19   [provider]  nitroGLYCERIN (NITROSTAT) 0.4 MG SL tablet Place 0.4 mg under the tongue every 5 (five) minutes as needed for chest pain.    [provider]     ALLERGIES:  Allergies  Allergen Reactions  . Ace Inhibitors Cough  . Sulfa Antibiotics Rash     SOCIAL HISTORY:  Social History   Socioeconomic History  . Marital status: Married    Spouse name: Not on file  . Number of children: Not on file  . Years of education: Not on file  . Highest education level: Not on file  Occupational History  . Not on file  Social Needs  . Financial resource strain: Not on file  . Food insecurity:    Worry: Not on file    Inability: Not on file  . Transportation needs:    Medical: Not on file    Non-medical: Not on file  Tobacco Use  . Smoking status: Former Smoker    Packs/day: 1.00    Years: 15.00    Pack years: 15.00    Types: Cigarettes  . Smokeless tobacco: Never Used  . Tobacco comment: quit 1977  Substance and Sexual Activity  . Alcohol use: Yes    Alcohol/week: 1.0 - 2.0 standard drinks    Types: 1 - 2 Shots of liquor per week  . Drug use: No  . Sexual activity: Never  Lifestyle  . Physical activity:    Days per week: Not on file    Minutes per session: Not on file  . Stress: Not on file  Relationships  . Social connections:    Talks on phone: Not on file    Gets together: Not on file    Attends religious service: Not on file    Active member of club or organization: Not on file    Attends meetings of clubs or organizations: Not on file    Relationship status: Not on file  . Intimate partner violence:    Fear of current or ex partner: Not on file    Emotionally abused: Not on file    Physically abused: Not on file    Forced sexual activity: Not on file  Other Topics Concern  . Not on file  Social History Narrative  . Not on file     FAMILY HISTORY:  Family History  Problem Relation Age of Onset  . Heart attack Mother   .  Prostate cancer Father   . Leukemia Brother   . Prostate cancer Brother     Otherwise negative.   REVIEW OF SYSTEMS:  Review of Systems  Constitutional: Negative for chills and fever.  Respiratory: Negative for cough and shortness of breath.   Cardiovascular: Negative for chest pain and palpitations.  Gastrointestinal: Positive for abdominal pain, nausea and vomiting. Negative for blood in stool, constipation and diarrhea.  Genitourinary: Negative for dysuria and urgency.  Neurological:  Negative for dizziness and headaches.  All other systems reviewed and are negative.   VITAL SIGNS:  Temp:  [97.8 F (36.6 C)-98.3 F (36.8 C)] 97.8 F (36.6 C) (05/14 0519) Pulse Rate:  [92-97] 92 (05/14 0519) Resp:  [20-24] 20 (05/14 0519) BP: (134-158)/(77-95) 134/77 (05/14 0519) SpO2:  [94 %-100 %] 94 % (05/14 0519) Weight:  [86.2 kg] 86.2 kg (05/13 1830)     Height: 5\' 6"  (167.6 cm) Weight: 86.2 kg BMI (Calculated): 30.68   PHYSICAL EXAM:  Physical Exam Vitals signs and nursing note reviewed.  Constitutional:      General: He is not in acute distress.    Appearance: He is well-developed. He is obese. He is not ill-appearing.  HENT:     Head: Normocephalic and atraumatic.  Eyes:     General: No scleral icterus.    Extraocular Movements: Extraocular movements intact.  Cardiovascular:     Rate and Rhythm: Normal rate and regular rhythm.     Heart sounds: Normal heart sounds. No murmur. No friction rub. No gallop.   Pulmonary:     Effort: Pulmonary effort is normal. No respiratory distress.     Breath sounds: Normal breath sounds. No wheezing or rhonchi.  Abdominal:     General: There is distension.     Palpations: Abdomen is soft.     Tenderness: There is abdominal tenderness in the right upper quadrant. There is no guarding or rebound.  Genitourinary:    Comments: Deferred Skin:    General: Skin is warm and dry.     Coloration: Skin is not jaundiced or pale.  Neurological:      General: No focal deficit present.     Mental Status: He is alert and oriented to person, place, and time.  Psychiatric:        Mood and Affect: Mood normal.        Behavior: Behavior normal.     INTAKE/OUTPUT:  This shift: No intake/output data recorded.  Last 2 shifts: @IOLAST2SHIFTS @  Labs:  CBC Latest Ref Rng & Units 05/08/2019 02/05/2019 04/21/2017  WBC 4.0 - 10.5 K/uL 8.9 6.1 4.2  Hemoglobin 13.0 - 17.0 g/dL 14.2 13.4 13.5  Hematocrit 39.0 - 52.0 % 41.9 39.6 39.2(L)  Platelets 150 - 400 K/uL 264 224 269   CMP Latest Ref Rng & Units 05/09/2019 05/08/2019 02/05/2019  Glucose 70 - 99 mg/dL 118(H) 112(H) 120(H)  BUN 8 - 23 mg/dL 17 17 22   Creatinine 0.61 - 1.24 mg/dL 1.18 1.16 1.08  Sodium 135 - 145 mmol/L 137 134(L) 138  Potassium 3.5 - 5.1 mmol/L 3.6 3.9 3.7  Chloride 98 - 111 mmol/L 100 94(L) 102  CO2 22 - 32 mmol/L 26 26 26   Calcium 8.9 - 10.3 mg/dL 8.4(L) 9.2 9.0  Total Protein 6.5 - 8.1 g/dL 6.2(L) 7.5 -  Total Bilirubin 0.3 - 1.2 mg/dL 1.0 0.6 -  Alkaline Phos 38 - 126 U/L 109 66 -  AST 15 - 41 U/L 395(H) 25 -  ALT 0 - 44 U/L 232(H) 33 -     Imaging studies:   RUQ [place] (05/08/2019) personally reviewed and radiologist report reviewed:  IMPRESSION: 1. Hydropic gallbladder with gallbladder sludge. No other evidence of acute cholecystitis. 2. Hepatic steatosis.  CT Abdomen/Pelvis (05/08/2019) personally reviewed and radiologist report reviewed:  IMPRESSION: 1. Distended gallbladder at 6.6 cm transverse. Occasionally cholecystitis can present with gallbladder distention. Consider dedicated hepatobiliary limited ultrasound for further characterization. 2. No bowel abnormality is identified. 3. Lumbar  impingement at L4-5 and L5-S1. Another notable feature is what appears to be a large disc extrusion extending caudad in the right lateral recess from L3-4. 4. Diffuse hepatic steatosis. 5.  Aortic Atherosclerosis (ICD10-I70.0). 6. Mildly prominent prostate gland with  some left anterior convexity-possibly benign but prostate cancer not excluded. Correlate with PSA level and prostate history. 7. Old granulomatous disease.   Assessment/Plan: (ICD-10's: K81.9) 78 y.o. male with RUQ abdominal pain likely attributable to acute cholecystitis.    - NPO + IVF + IV ABx (Zosyn)  - pain control prn; antiemetics prn  - monitor abdominal examination   - Monitor LFTs   - Plan for laparoscopic cholecystectomy with dr Dahlia Byes today pending OR/Anesthesia availability  - All risks, benefits, and alternatives to above procedure(s) were discussed with the patient, all of his questions were answered to his expressed satisfaction, patient expresses he wishes to proceed, and informed consent was obtained.   - Medical management of comorbidities  All of the above findings and recommendations were discussed with the patient and his wife via telephone, and all of his questions were answered to his expressed satisfaction.  -- Edison Simon, PA-C [facility] 05/09/2019, 8:37 AM [phone] M-F: 7am - 4pm

## 2019-05-09 NOTE — Op Note (Signed)
Laparoscopic Cholecystectomy  Pre-operative Diagnosis: Acute cholecystitis, obesity  Post-operative Diagnosis: Gangrenous cholecystitis  Procedure: laparoscopic cholecystectomy w IOC  Surgeon: Caroleen Hamman, MD FACS  Anesthesia: Gen. with endotracheal tube  Findings: Gangrenous Cholecystitis w severe inflammatory response and intrahepatic component  Estimated Blood Loss: 100cc         Drains: 15 FR RUQ         Specimens: Gallbladder           Complications: none   Procedure Details  The patient was seen again in the Holding Room. The benefits, complications, treatment options, and expected outcomes were discussed with the patient. The risks of bleeding, infection, recurrence of symptoms, failure to resolve symptoms, bile duct damage, bile duct leak, retained common bile duct stone, bowel injury, any of which could require further surgery and/or ERCP, stent, or papillotomy were reviewed with the patient. The likelihood of improving the patient's symptoms with return to their baseline status is good.  The patient and/or family concurred with the proposed plan, giving informed consent.  The patient was taken to Operating Room, identified as Brett Blake and the procedure verified as Laparoscopic Cholecystectomy.  A Time Out was held and the above information confirmed.  Prior to the induction of general anesthesia, antibiotic prophylaxis was administered. VTE prophylaxis was in place. General endotracheal anesthesia was then administered and tolerated well. After the induction, the abdomen was prepped with Chloraprep and draped in the sterile fashion. The patient was positioned in the supine position.  Cut down technique was used to enter the abdominal cavity and a Hasson trochar was placed after two vicryl stitches were anchored to the fascia. Pneumoperitoneum was then created with CO2 and tolerated well without any adverse changes in the patient's vital signs.  Three 5-mm ports were  placed in the right upper quadrant all under direct vision. All skin incisions  were infiltrated with a local anesthetic agent before making the incision and placing the trocars.   The patient was positioned  in reverse Trendelenburg, tilted slightly to the patient's left.  The gallbladder was identified, the fundus grasped and retracted cephalad. Adhesions were lysed bluntly. The infundibulum was grasped and retracted laterally, exposing the peritoneum overlying the triangle of Calot. This was then divided and exposed in a blunt fashion.  Significant inflammatory response around the infundibulum and cystic duct junction. Meticulous blunt dissection was performed. I actually placed a 5th port on the LUQ to be able to obtain good exposure and push down on the omental fat.   An extended critical view of the cystic duct and cystic artery was obtained.  The cystic duct was clearly identified and bluntly dissected.   Ductotomy was performed and catheter inserted, contrast injected obtaining multiple views. No evidence of Bile duct injuries, contrast reached the duodenum and no filling defects. Cathter removed. Artery and duct were double clipped and divided.  The gallbladder was taken from the gallbladder fossa in a retrograde fashion with the electrocautery. Gb was intrahepatic and upon removal the liver surface was very raw, Adequate hemostasis w cautery was obtained. The gallbladder was removed and placed in an Endocatch bag. The liver bed was irrigated and inspected. Hemostasis was achieved with the electrocautery. I place Evaseal given the raw surface of the liver. Copious irrigation was utilized and was repeatedly aspirated until clear.  The gallbladder and Endocatch sac were then removed through a port site.  15 Fr jp placed on the RUQ.  Inspection of the right upper quadrant was  performed. No bleeding, bile duct injury or leak, or bowel injury was noted. Pneumoperitoneum was released.  The  periumbilical port site was closed with interrumpted 0 Vicryl sutures. 4-0 subcuticular Monocryl was used to close the skin. Dermabond was  applied.  The patient was then extubated and brought to the recovery room in stable condition. Sponge, lap, and needle counts were correct at closure and at the conclusion of the case.               Caroleen Hamman, MD, FACS

## 2019-05-09 NOTE — Progress Notes (Signed)
Per Respiratory Protocol Assessment, patient scored a level 1, frequency changed from Q6 to Q6 PRN. Patient SAT on Room Air 95% with clear slightly diminished breath sounds. Patient states he does not take breathing treatments at home unless he becomes overly exerted. Patient advised to call Respiratory if breathing treatment needed. Patient resting comfortably in bed, no distress noted. Will continue to monitor.  Incentive Spirometry education completed as well.

## 2019-05-09 NOTE — Progress Notes (Addendum)
Per Dr. Dahlia Byes okay for RN to order percocet 5-325mg  1-2 tablets for moderate to severe pain Q4 PRN.  RN called MD back after seen that pt is getting 1000 mg of tylenol Q6 schedule. Per MD okay to DC the percocet order and instead order oxycodone 5-10 mg Q4 as needed.

## 2019-05-09 NOTE — Anesthesia Post-op Follow-up Note (Signed)
Anesthesia QCDR form completed.        

## 2019-05-10 ENCOUNTER — Encounter: Payer: Self-pay | Admitting: Surgery

## 2019-05-10 LAB — CBC
HCT: 32.9 % — ABNORMAL LOW (ref 39.0–52.0)
Hemoglobin: 11 g/dL — ABNORMAL LOW (ref 13.0–17.0)
MCH: 32.4 pg (ref 26.0–34.0)
MCHC: 33.4 g/dL (ref 30.0–36.0)
MCV: 96.8 fL (ref 80.0–100.0)
Platelets: 179 10*3/uL (ref 150–400)
RBC: 3.4 MIL/uL — ABNORMAL LOW (ref 4.22–5.81)
RDW: 13.6 % (ref 11.5–15.5)
WBC: 8.6 10*3/uL (ref 4.0–10.5)
nRBC: 0 % (ref 0.0–0.2)

## 2019-05-10 LAB — GLUCOSE, CAPILLARY
Glucose-Capillary: 234 mg/dL — ABNORMAL HIGH (ref 70–99)
Glucose-Capillary: 249 mg/dL — ABNORMAL HIGH (ref 70–99)
Glucose-Capillary: 128 mg/dL — ABNORMAL HIGH (ref 70–99)
Glucose-Capillary: 175 mg/dL — ABNORMAL HIGH (ref 70–99)

## 2019-05-10 LAB — COMPREHENSIVE METABOLIC PANEL
ALT: 164 U/L — ABNORMAL HIGH (ref 0–44)
AST: 123 U/L — ABNORMAL HIGH (ref 15–41)
Albumin: 2.8 g/dL — ABNORMAL LOW (ref 3.5–5.0)
Alkaline Phosphatase: 87 U/L (ref 38–126)
Anion gap: 9 (ref 5–15)
BUN: 20 mg/dL (ref 8–23)
CO2: 22 mmol/L (ref 22–32)
Calcium: 7.8 mg/dL — ABNORMAL LOW (ref 8.9–10.3)
Chloride: 101 mmol/L (ref 98–111)
Creatinine, Ser: 1.15 mg/dL (ref 0.61–1.24)
GFR calc Af Amer: >60 mL/min (ref >60)
GFR calc non Af Amer: >60 mL/min (ref >60)
Glucose, Bld: 269 mg/dL — ABNORMAL HIGH (ref 70–99)
Potassium: 3.3 mmol/L — ABNORMAL LOW (ref 3.5–5.1)
Sodium: 132 mmol/L — ABNORMAL LOW (ref 135–145)
Total Bilirubin: 0.6 mg/dL (ref 0.3–1.2)
Total Protein: 5.7 g/dL — ABNORMAL LOW (ref 6.5–8.1)

## 2019-05-10 LAB — SURGICAL PATHOLOGY

## 2019-05-10 MED ORDER — POTASSIUM CHLORIDE CRYS ER 20 MEQ PO TBCR
20.0000 meq | EXTENDED_RELEASE_TABLET | Freq: Two times a day (BID) | ORAL | Status: DC
  Administered 2019-05-10 – 2019-05-11 (×3): 20 meq via ORAL
  Filled 2019-05-10 (×3): qty 1

## 2019-05-10 NOTE — Progress Notes (Addendum)
[place] Hospital Day(s): 2.   Post op day(s): 1 Day Post-Op.   Interval History: Patient seen and examined, no acute events or new complaints overnight. Patient reports that he is having some abdominal soreness near his drain site but otherwise is feeling much better and "ready to go." No reports of fever, chills, nausea, or emesis. He does endorse having a large BM this morning. Tolerating clear liquid diet. JP with 140 ccs out post-op.    Review of Systems:  Constitutional: denies fever, chills  Gastrointestinal: + abdominal pain (Soreness), denies N/V, or diarrhea/and bowel function as per interval history Integumentary: + Surgical incisions   Vital signs in last 24 hours: [min-max] current  Temp:  [97.6 F (36.4 C)-98.7 F (37.1 C)] 98.2 F (36.8 C) (05/15 0444) Pulse Rate:  [85-105] 88 (05/15 0444) Resp:  [12-22] 20 (05/15 0444) BP: (120-162)/(76-95) 120/77 (05/15 0444) SpO2:  [91 %-100 %] 96 % (05/15 0444) Weight:  [86.2 kg] 86.2 kg (05/14 1041)     Height: 5\' 5"  (165.1 cm) Weight: 86.2 kg BMI (Calculated): 31.62   Intake/Output this shift:  No intake/output data recorded.    Physical Exam:  Constitutional: alert, cooperative and no distress  Respiratory: breathing non-labored at rest  Cardiovascular: regular rate and sinus rhythm  Gastrointestinal: soft, non-tender, and non-distended. JP in RUQ with serosanguinous fluid in bulb Integumentary: laparoscopic incisions are CDI, no erythema or drainage. Surrounding ecchymosis   Labs:  CBC Latest Ref Rng & Units 05/10/2019 05/08/2019 02/05/2019  WBC 4.0 - 10.5 K/uL 8.6 8.9 6.1  Hemoglobin 13.0 - 17.0 g/dL 11.0(L) 14.2 13.4  Hematocrit 39.0 - 52.0 % 32.9(L) 41.9 39.6  Platelets 150 - 400 K/uL 179 264 224   CMP Latest Ref Rng & Units 05/10/2019 05/09/2019 05/08/2019  Glucose 70 - 99 mg/dL 269(H) 118(H) 112(H)  BUN 8 - 23 mg/dL 20 17 17   Creatinine 0.61 - 1.24 mg/dL 1.15 1.18 1.16   Sodium 135 - 145 mmol/L 132(L) 137 134(L)  Potassium 3.5 - 5.1 mmol/L 3.3(L) 3.6 3.9  Chloride 98 - 111 mmol/L 101 100 94(L)  CO2 22 - 32 mmol/L 22 26 26   Calcium 8.9 - 10.3 mg/dL 7.8(L) 8.4(L) 9.2  Total Protein 6.5 - 8.1 g/dL 5.7(L) 6.2(L) 7.5  Total Bilirubin 0.3 - 1.2 mg/dL 0.6 1.0 0.6  Alkaline Phos 38 - 126 U/L 87 109 66  AST 15 - 41 U/L 123(H) 395(H) 25  ALT 0 - 44 U/L 164(H) 232(H) 33     Imaging studies:No new pertinent imaging studies   Assessment/Plan: (ICD-10's: K81.0) 78 y.o. male with mild anemia, most likely dilutional, and hypokalemia otherwise doing well 1 Day Post-Op s/p laparoscopic cholecystectomy for for acute cholecystitis with gangrenous gallbladder seen intraoperatively.   - Advance diet as tolerates + wean IVF   - Continue IV ABx (Zosyn)  - Pain control prn; antiemetics prn  - monitor abdominal examination; on-going bowel function  - monitor JP output   - Replete potassium; monitor; morning BMP  - medical management of comorbidities   - mobilization encouraged  - DVT prophylaxis   - Discharge planning: Needs 48 hours of IV ABx post-op, hopefully home tomorrow   All of the above findings and recommendations were discussed with the patient, and the medical team, and all of patient's questions were answered to his expressed satisfaction.  -- Edison Simon, PA-C [facility] 05/10/2019, 7:56 AM [phone] M-F: 7am - 4pm

## 2019-05-10 NOTE — Care Management Important Message (Signed)
Important Message  Patient Details  Name: Brett Blake MRN: [id number] Date of Birth: 06-11-1941   Medicare Important Message Given:  Yes    [name] 05/10/2019, 12:01 PM

## 2019-05-10 NOTE — Discharge Instructions (Signed)
In addition to included general post-operative instructions for laparoscopic cholecystectomy,  Diet: Resume home diet.   Activity: No heavy lifting >20 pounds (children, pets, laundry, garbage) or strenuous activity until follow-up in 2 weeks, but light activity and walking are encouraged. Do not drive or drink alcohol if taking narcotic pain medications.  Wound care: You may shower/get incision wet with soapy water and pat dry (do not rub incisions), but no baths or submerging incision underwater until follow-up.   Medications: Resume all home medications. For mild to moderate pain: acetaminophen (Tylenol) or ibuprofen/naproxen (if no kidney disease). Combining Tylenol with alcohol can substantially increase your risk of causing liver disease. Narcotic pain medications, if prescribed, can be used for severe pain, though may cause nausea, constipation, and drowsiness. Do not combine Tylenol and Percocet (or similar) within a 6 hour period as Percocet (and similar) contain(s) Tylenol. If you do not need the narcotic pain medication, you do not need to fill the prescription.  Call office [phone] / [phone]) at any time if any questions, worsening pain, fevers/chills, bleeding, drainage from incision site, or other concerns.

## 2019-05-10 NOTE — Anesthesia Postprocedure Evaluation (Signed)
Anesthesia Post Note  Patient: Brett Blake  Procedure(s) Performed: LAPAROSCOPIC CHOLECYSTECTOMY WITH INTRAOPERATIVE CHOLANGIOGRAM (N/A )  Patient location during evaluation: PACU Anesthesia Type: General Level of consciousness: awake and alert Pain management: pain level controlled Vital Signs Assessment: post-procedure vital signs reviewed and stable Respiratory status: spontaneous breathing, nonlabored ventilation and respiratory function stable Cardiovascular status: blood pressure returned to baseline and stable Postop Assessment: no apparent nausea or vomiting Anesthetic complications: no     Last Vitals:  Vitals:   05/10/19 0444 05/10/19 0835  BP: 120/77 130/75  Pulse: 88 77  Resp: 20   Temp: 36.8 C   SpO2: 96%     Last Pain:  Vitals:   05/10/19 0608  TempSrc:   PainSc: 0-No pain                 Durenda Hurt

## 2019-05-11 LAB — BASIC METABOLIC PANEL
Anion gap: 9 (ref 5–15)
BUN: 16 mg/dL (ref 8–23)
CO2: 23 mmol/L (ref 22–32)
Calcium: 8.4 mg/dL — ABNORMAL LOW (ref 8.9–10.3)
Chloride: 106 mmol/L (ref 98–111)
Creatinine, Ser: 1.01 mg/dL (ref 0.61–1.24)
GFR calc Af Amer: >60 mL/min (ref >60)
GFR calc non Af Amer: >60 mL/min (ref >60)
Glucose, Bld: 168 mg/dL — ABNORMAL HIGH (ref 70–99)
Potassium: 3.7 mmol/L (ref 3.5–5.1)
Sodium: 138 mmol/L (ref 135–145)

## 2019-05-11 LAB — GLUCOSE, CAPILLARY: Glucose-Capillary: 152 mg/dL — ABNORMAL HIGH (ref 70–99)

## 2019-05-11 MED ORDER — AMOXICILLIN-POT CLAVULANATE 875-125 MG PO TABS: 1.0000 | ORAL_TABLET | Freq: Two times a day (BID) | ORAL | 0 refills | Status: AC

## 2019-05-11 MED ORDER — HYDROCODONE-ACETAMINOPHEN 5-325 MG PO TABS: 1.0000 | ORAL_TABLET | ORAL | 0 refills | Status: AC | PRN

## 2019-05-11 MED ORDER — SODIUM CHLORIDE 0.9 % IV SOLN
INTRAVENOUS | Status: DC | PRN
  Administered 2019-05-11: 06:00:00 250 mL via INTRAVENOUS

## 2019-05-11 NOTE — Plan of Care (Signed)

## 2019-05-11 NOTE — Discharge Summary (Addendum)
Patient ID: RAFFI MILSTEIN MRN: [id number] DOB/AGE: Jan 04, 1941 78 y.o.  Admit date: 05/08/2019 Discharge date: 05/11/2019   Discharge Diagnoses:  Active Problems:   Cholecystitis Acute cholecystitis  Procedures: Laparoscopic cholecystectomy  Hospital Course: Patient with acute cholecystitis.  He underwent laparoscopic cholecystectomy and he was found with gangrenous gallbladder.  Drain was left in place due to the amount of infection.  Patient received 48 hours of IV antibiotics.  Pain under control.  Tolerating diet.  Ambulating.  Right upper quadrant drain in place, serosanguineous.  Due to the amount of drainage in the last 24-hour 110 mL, he will continue with drain and will reassessed at the clinic for removal.  Consults: None  Disposition: Discharge disposition: 01-Home or Self Care       Discharge Instructions    Diet - low sodium heart healthy   Complete by:  As directed      Allergies as of 05/11/2019      Reactions   Ace Inhibitors Cough   Sulfa Antibiotics Rash      Medication List    TAKE these medications   albuterol 1.25 MG/3ML nebulizer solution Commonly known as:  ACCUNEB TAKE 3 MLS (1.25 MG TOTAL) BY NEBULIZATION EVERY 6 (SIX) HOURS AS NEEDED FOR WHEEZING   ALPRAZolam 0.5 MG tablet Commonly known as:  XANAX Take 0.5 tablets by mouth daily as needed.   amLODipine 5 MG tablet Commonly known as:  NORVASC Take 5 mg by mouth daily.   amoxicillin-clavulanate 875-125 MG tablet Commonly known as:  Augmentin Take 1 tablet by mouth 2 (two) times daily for 7 days.   ascorbic acid 1000 MG tablet Commonly known as:  VITAMIN C Take 1,000 mg by mouth daily.   aspirin 81 MG chewable tablet Chew 81 mg by mouth daily.   carvedilol 12.5 MG tablet Commonly known as:  COREG Take 12.5 mg by mouth 2 (two) times a day.   diltiazem 180 MG 24 hr capsule Commonly known as:  CARDIZEM CD Take 1 capsule by mouth 2 (two) times a day.   esomeprazole 40 MG  capsule Commonly known as:  NEXIUM Take 40 mg by mouth daily at 12 noon.   fluticasone 50 MCG/ACT nasal spray Commonly known as:  FLONASE Place 1 spray into both nostrils daily as needed for allergies or rhinitis.   glimepiride 1 MG tablet Commonly known as:  AMARYL Take 1 mg by mouth daily.   glucosamine-chondroitin 500-400 MG tablet Take 1 tablet by mouth daily.   HYDROcodone-acetaminophen 5-325 MG tablet Commonly known as:  Norco Take 1 tablet by mouth every 4 (four) hours as needed for up to 3 days for moderate pain.   ibuprofen 200 MG tablet Commonly known as:  ADVIL Take 200 mg by mouth every 6 (six) hours as needed.   metoprolol succinate 50 MG 24 hr tablet Commonly known as:  TOPROL-XL Take 1 tablet by mouth 2 (two) times a day.   montelukast 10 MG tablet Commonly known as:  SINGULAIR Take 10 mg by mouth at bedtime.   MULTIVITAMIN PO Take 1 tablet by mouth daily.   nitroGLYCERIN 0.4 MG SL tablet Commonly known as:  NITROSTAT Place 0.4 mg under the tongue every 5 (five) minutes as needed for chest pain.   simvastatin 20 MG tablet Commonly known as:  ZOCOR Take 20 mg by mouth daily.   triamterene-hydrochlorothiazide 37.5-25 MG capsule Commonly known as:  DYAZIDE Take 1 capsule by mouth daily.   zolpidem 5 MG tablet Commonly  known as:  AMBIEN Take 1 tablet by mouth at bedtime.      Follow-up Information    Olean Ree, MD. Go on 05/17/2019.   Specialty:  General Surgery Why:  05/22 at 1200 Will have patient see Dr Hampton Abbot on 05/22 to re-evaluate drain, s/p lap chole, needs face to face Contact information: [address] [place] [address] [phone]          Discharge encounter was more than 30 minutes most of the time orienting the patient about postop care and coordinated plan of care.

## 2019-05-13 DIAGNOSIS — E1151 Type 2 diabetes mellitus with diabetic peripheral angiopathy without gangrene: Secondary | ICD-10-CM | POA: Diagnosis not present

## 2019-05-13 DIAGNOSIS — Z0000 Encounter for general adult medical examination without abnormal findings: Secondary | ICD-10-CM | POA: Diagnosis not present

## 2019-05-13 DIAGNOSIS — R972 Elevated prostate specific antigen [PSA]: Secondary | ICD-10-CM | POA: Diagnosis not present

## 2019-05-13 DIAGNOSIS — E782 Mixed hyperlipidemia: Secondary | ICD-10-CM | POA: Diagnosis not present

## 2019-05-17 ENCOUNTER — Encounter: Payer: Self-pay | Admitting: Surgery

## 2019-05-17 ENCOUNTER — Other Ambulatory Visit: Payer: Self-pay

## 2019-05-17 ENCOUNTER — Ambulatory Visit (INDEPENDENT_AMBULATORY_CARE_PROVIDER_SITE_OTHER): Payer: PPO | Admitting: Surgery

## 2019-05-17 VITALS — BP 130/70 | HR 72 | Temp 97.9°F | Ht 65.0 in | Wt 190.0 lb

## 2019-05-17 DIAGNOSIS — K819 Cholecystitis, unspecified: Secondary | ICD-10-CM

## 2019-05-17 DIAGNOSIS — Z09 Encounter for follow-up examination after completed treatment for conditions other than malignant neoplasm: Secondary | ICD-10-CM

## 2019-05-17 NOTE — Patient Instructions (Signed)
Patient to follow up with Dr. Celine Ahr for drain removal. The patient is aware to call back for any questions or concerns.

## 2019-05-18 ENCOUNTER — Encounter: Payer: Self-pay | Admitting: Surgery

## 2019-05-18 NOTE — Progress Notes (Addendum)
05/17/2019  HPI: Brett Blake is a 78 y.o. male s/p laparoscopic cholecystectomy on 05/09/19 for gangrenous cholecystitis.  JP drain was left in place and patient was discharged on 5/16 with drain in place.  He presents today for follow up.  Patient reports that he's doing well, with some weakness still but improving.  Denies any significant pain.  Eating well and having bowel function.  Drain having daily output of about 1-2 ounces.  Vital signs: BP 130/70   Pulse 72   Temp 97.9 F (36.6 C) (Skin)   Ht 5\' 5"  (1.651 m)   Wt 190 lb (86.2 kg)   SpO2 97%   BMI 31.62 kg/m    Physical Exam: Constitutional: No acute distress Abdomen:  Soft, non-distended, appropriately sore to palpation.  Incisions are clean, dry, intact, with some surrounding ecchymosis.  There is also lower abdominal ecchymosis from prophylactic DVT injections in hospital.  JP drain with serosanguinous output.  Assessment/Plan: This is a 78 y.o. male s/p laparoscopic cholecystectomy.  --Discussed with the patient that although the quality of the JP output is great and without any concern for infection, the volume is still too high to remove the drain today.  Discussed that drain output should be 1 ounce or less per day. --Patient will follow up next week for drain removal with Dr. Celine Ahr. --Reminded patient that he still has 3 more weeks of no heavy lifting or pushing restrictions.   Melvyn Neth, [facility]

## 2019-05-22 ENCOUNTER — Other Ambulatory Visit: Payer: Self-pay

## 2019-05-22 ENCOUNTER — Encounter: Payer: Self-pay | Admitting: General Surgery

## 2019-05-22 ENCOUNTER — Ambulatory Visit (INDEPENDENT_AMBULATORY_CARE_PROVIDER_SITE_OTHER): Payer: PPO | Admitting: General Surgery

## 2019-05-22 VITALS — BP 127/88 | HR 108 | Temp 97.7°F | Ht 66.0 in | Wt 186.0 lb

## 2019-05-22 DIAGNOSIS — Z9049 Acquired absence of other specified parts of digestive tract: Secondary | ICD-10-CM

## 2019-05-22 NOTE — Patient Instructions (Signed)
You may shower. The steri strip will fall off on its own. You may place a dressing over the area if needed. You do not need to put anything on the wound.

## 2019-05-22 NOTE — Progress Notes (Signed)
Brett Blake is here today to have his drain removed.  He is a 78 year old man who underwent a laparoscopic cholecystectomy for a gangrenous gallbladder.  This was performed by Dr. Dahlia Byes.  A drain was left.  He saw Dr. Hampton Abbot last week but the drain output was too high.  Today, he brings a sheet that shows approximately 1/4 ounce of drainage over the past 3 to 4 days.  The drain was removed bedside without complication.  Steri-Strips were applied over the open laparoscopic port site through which the drain was placed.  He can follow-up with Dr. Dahlia Byes, likely via a telephone or virtual visit in the next week or so.

## 2019-05-27 ENCOUNTER — Telehealth: Payer: PPO | Admitting: Surgery

## 2019-05-27 ENCOUNTER — Other Ambulatory Visit: Payer: Self-pay

## 2019-05-29 ENCOUNTER — Telehealth: Payer: PPO | Admitting: Surgery

## 2019-05-31 ENCOUNTER — Encounter: Payer: Self-pay | Admitting: Surgery

## 2019-06-03 ENCOUNTER — Telehealth (INDEPENDENT_AMBULATORY_CARE_PROVIDER_SITE_OTHER): Payer: PPO | Admitting: Surgery

## 2019-06-03 ENCOUNTER — Other Ambulatory Visit: Payer: Self-pay

## 2019-06-03 DIAGNOSIS — E1151 Type 2 diabetes mellitus with diabetic peripheral angiopathy without gangrene: Secondary | ICD-10-CM | POA: Diagnosis not present

## 2019-06-03 DIAGNOSIS — Z09 Encounter for follow-up examination after completed treatment for conditions other than malignant neoplasm: Secondary | ICD-10-CM

## 2019-06-03 DIAGNOSIS — I2510 Atherosclerotic heart disease of native coronary artery without angina pectoris: Secondary | ICD-10-CM | POA: Diagnosis not present

## 2019-06-03 DIAGNOSIS — R5383 Other fatigue: Secondary | ICD-10-CM | POA: Diagnosis not present

## 2019-06-03 NOTE — Progress Notes (Signed)
Telephone call. PT very appreciative. Feeling the best he has felt in years. Taking PO, no fevers or chills. No abd pain,. Incisions have healed. HE has no concerns  D/W the pt about heavy lifting RTC prn

## 2019-08-29 DIAGNOSIS — Z20828 Contact with and (suspected) exposure to other viral communicable diseases: Secondary | ICD-10-CM | POA: Diagnosis not present

## 2019-08-29 DIAGNOSIS — J40 Bronchitis, not specified as acute or chronic: Secondary | ICD-10-CM | POA: Diagnosis not present

## 2019-09-16 DIAGNOSIS — E782 Mixed hyperlipidemia: Secondary | ICD-10-CM | POA: Diagnosis not present

## 2019-09-16 DIAGNOSIS — E1151 Type 2 diabetes mellitus with diabetic peripheral angiopathy without gangrene: Secondary | ICD-10-CM | POA: Diagnosis not present

## 2019-10-08 DIAGNOSIS — D72819 Decreased white blood cell count, unspecified: Secondary | ICD-10-CM | POA: Diagnosis not present

## 2019-11-04 DIAGNOSIS — F5104 Psychophysiologic insomnia: Secondary | ICD-10-CM | POA: Diagnosis not present

## 2019-11-04 DIAGNOSIS — E1151 Type 2 diabetes mellitus with diabetic peripheral angiopathy without gangrene: Secondary | ICD-10-CM | POA: Diagnosis not present

## 2019-11-04 DIAGNOSIS — R21 Rash and other nonspecific skin eruption: Secondary | ICD-10-CM | POA: Diagnosis not present

## 2019-11-19 DIAGNOSIS — L230 Allergic contact dermatitis due to metals: Secondary | ICD-10-CM | POA: Diagnosis not present

## 2019-11-19 DIAGNOSIS — I479 Paroxysmal tachycardia, unspecified: Secondary | ICD-10-CM | POA: Diagnosis not present

## 2020-01-20 DIAGNOSIS — Z125 Encounter for screening for malignant neoplasm of prostate: Secondary | ICD-10-CM | POA: Diagnosis not present

## 2020-01-20 DIAGNOSIS — J410 Simple chronic bronchitis: Secondary | ICD-10-CM | POA: Diagnosis not present

## 2020-01-20 DIAGNOSIS — E1151 Type 2 diabetes mellitus with diabetic peripheral angiopathy without gangrene: Secondary | ICD-10-CM | POA: Diagnosis not present

## 2020-01-20 DIAGNOSIS — I479 Paroxysmal tachycardia, unspecified: Secondary | ICD-10-CM | POA: Diagnosis not present

## 2020-01-20 DIAGNOSIS — Z0000 Encounter for general adult medical examination without abnormal findings: Secondary | ICD-10-CM | POA: Diagnosis not present

## 2020-01-20 DIAGNOSIS — E782 Mixed hyperlipidemia: Secondary | ICD-10-CM | POA: Diagnosis not present

## 2020-05-28 ENCOUNTER — Encounter: Payer: Self-pay | Admitting: *Deleted

## 2020-05-28 ENCOUNTER — Other Ambulatory Visit: Payer: Self-pay

## 2020-05-28 ENCOUNTER — Encounter: Payer: Medicare Other | Attending: Internal Medicine | Admitting: *Deleted

## 2020-05-28 DIAGNOSIS — K219 Gastro-esophageal reflux disease without esophagitis: Secondary | ICD-10-CM | POA: Insufficient documentation

## 2020-05-28 DIAGNOSIS — Z7984 Long term (current) use of oral hypoglycemic drugs: Secondary | ICD-10-CM | POA: Insufficient documentation

## 2020-05-28 DIAGNOSIS — J449 Chronic obstructive pulmonary disease, unspecified: Secondary | ICD-10-CM | POA: Insufficient documentation

## 2020-05-28 DIAGNOSIS — E119 Type 2 diabetes mellitus without complications: Secondary | ICD-10-CM | POA: Insufficient documentation

## 2020-05-28 DIAGNOSIS — I10 Essential (primary) hypertension: Secondary | ICD-10-CM | POA: Insufficient documentation

## 2020-05-28 DIAGNOSIS — E785 Hyperlipidemia, unspecified: Secondary | ICD-10-CM | POA: Insufficient documentation

## 2020-05-28 DIAGNOSIS — Z87891 Personal history of nicotine dependence: Secondary | ICD-10-CM | POA: Insufficient documentation

## 2020-05-28 DIAGNOSIS — G4730 Sleep apnea, unspecified: Secondary | ICD-10-CM | POA: Insufficient documentation

## 2020-05-28 DIAGNOSIS — Z7982 Long term (current) use of aspirin: Secondary | ICD-10-CM | POA: Insufficient documentation

## 2020-05-28 DIAGNOSIS — F329 Major depressive disorder, single episode, unspecified: Secondary | ICD-10-CM | POA: Insufficient documentation

## 2020-05-28 DIAGNOSIS — I208 Other forms of angina pectoris: Secondary | ICD-10-CM | POA: Insufficient documentation

## 2020-05-28 DIAGNOSIS — M1990 Unspecified osteoarthritis, unspecified site: Secondary | ICD-10-CM | POA: Insufficient documentation

## 2020-05-28 DIAGNOSIS — Z79899 Other long term (current) drug therapy: Secondary | ICD-10-CM | POA: Insufficient documentation

## 2020-05-28 DIAGNOSIS — F419 Anxiety disorder, unspecified: Secondary | ICD-10-CM | POA: Insufficient documentation

## 2020-05-28 NOTE — Progress Notes (Signed)
Virtual Orientation completed today. Brett Blake has appt on 6/8 at 930 AM for EP eval and gym orientation. Documentation of diagnosis can be found in CHL5/20/2021 office visit.

## 2020-06-01 ENCOUNTER — Encounter: Payer: Self-pay | Admitting: Emergency Medicine

## 2020-06-01 ENCOUNTER — Emergency Department: Payer: Medicare Other

## 2020-06-01 ENCOUNTER — Emergency Department
Admission: EM | Admit: 2020-06-01 | Discharge: 2020-06-02 | Disposition: A | Discharge status: Home / Self Care | Payer: Medicare Other | Attending: Emergency Medicine | Admitting: Emergency Medicine

## 2020-06-01 ENCOUNTER — Other Ambulatory Visit: Payer: Self-pay

## 2020-06-01 DIAGNOSIS — I208 Other forms of angina pectoris: Secondary | ICD-10-CM | POA: Diagnosis present

## 2020-06-01 DIAGNOSIS — F419 Anxiety disorder, unspecified: Secondary | ICD-10-CM | POA: Diagnosis not present

## 2020-06-01 DIAGNOSIS — F1099 Alcohol use, unspecified with unspecified alcohol-induced disorder: Secondary | ICD-10-CM | POA: Diagnosis not present

## 2020-06-01 DIAGNOSIS — F329 Major depressive disorder, single episode, unspecified: Secondary | ICD-10-CM | POA: Diagnosis not present

## 2020-06-01 DIAGNOSIS — Z7984 Long term (current) use of oral hypoglycemic drugs: Secondary | ICD-10-CM | POA: Diagnosis not present

## 2020-06-01 DIAGNOSIS — R2243 Localized swelling, mass and lump, lower limb, bilateral: Secondary | ICD-10-CM | POA: Diagnosis not present

## 2020-06-01 DIAGNOSIS — E785 Hyperlipidemia, unspecified: Secondary | ICD-10-CM | POA: Diagnosis not present

## 2020-06-01 DIAGNOSIS — E119 Type 2 diabetes mellitus without complications: Secondary | ICD-10-CM | POA: Diagnosis not present

## 2020-06-01 DIAGNOSIS — K219 Gastro-esophageal reflux disease without esophagitis: Secondary | ICD-10-CM | POA: Insufficient documentation

## 2020-06-01 DIAGNOSIS — J45909 Unspecified asthma, uncomplicated: Secondary | ICD-10-CM | POA: Insufficient documentation

## 2020-06-01 DIAGNOSIS — R0789 Other chest pain: Secondary | ICD-10-CM | POA: Insufficient documentation

## 2020-06-01 DIAGNOSIS — Z87891 Personal history of nicotine dependence: Secondary | ICD-10-CM | POA: Diagnosis not present

## 2020-06-01 DIAGNOSIS — Y906 Blood alcohol level of 120-199 mg/100 ml: Secondary | ICD-10-CM | POA: Insufficient documentation

## 2020-06-01 DIAGNOSIS — M1990 Unspecified osteoarthritis, unspecified site: Secondary | ICD-10-CM | POA: Diagnosis not present

## 2020-06-01 DIAGNOSIS — I2510 Atherosclerotic heart disease of native coronary artery without angina pectoris: Secondary | ICD-10-CM | POA: Insufficient documentation

## 2020-06-01 DIAGNOSIS — I10 Essential (primary) hypertension: Secondary | ICD-10-CM | POA: Insufficient documentation

## 2020-06-01 DIAGNOSIS — Z79899 Other long term (current) drug therapy: Secondary | ICD-10-CM | POA: Diagnosis not present

## 2020-06-01 DIAGNOSIS — Z7982 Long term (current) use of aspirin: Secondary | ICD-10-CM | POA: Diagnosis not present

## 2020-06-01 DIAGNOSIS — J449 Chronic obstructive pulmonary disease, unspecified: Secondary | ICD-10-CM | POA: Diagnosis not present

## 2020-06-01 DIAGNOSIS — R079 Chest pain, unspecified: Secondary | ICD-10-CM

## 2020-06-01 DIAGNOSIS — R109 Unspecified abdominal pain: Secondary | ICD-10-CM | POA: Insufficient documentation

## 2020-06-01 DIAGNOSIS — G4730 Sleep apnea, unspecified: Secondary | ICD-10-CM | POA: Diagnosis not present

## 2020-06-01 MED ORDER — MORPHINE SULFATE (PF) 4 MG/ML IV SOLN
4.0000 mg | Freq: Once | INTRAVENOUS | Status: AC
  Administered 2020-06-02: 2 mg via INTRAVENOUS
  Filled 2020-06-01: qty 1

## 2020-06-01 MED ORDER — ONDANSETRON HCL 4 MG/2ML IJ SOLN
4.0000 mg | Freq: Once | INTRAMUSCULAR | Status: AC
  Administered 2020-06-02: 4 mg via INTRAVENOUS
  Filled 2020-06-01: qty 2

## 2020-06-01 MED ORDER — FAMOTIDINE IN NACL 20-0.9 MG/50ML-% IV SOLN
20.0000 mg | Freq: Once | INTRAVENOUS | Status: AC
  Administered 2020-06-02: 20 mg via INTRAVENOUS
  Filled 2020-06-01: qty 50

## 2020-06-01 NOTE — ED Notes (Signed)
Xray at the bedside.

## 2020-06-01 NOTE — ED Provider Notes (Signed)
[facility] Emergency Department Provider Note  ____________________________________________  Time seen: Approximately 11:45 PM  I have reviewed the triage vital signs and the nursing notes.   HISTORY  Chief Complaint Chest Pain   HPI Brett Blake is a 79 y.o. male with a history of COPD, asthma, diabetes, GERD, hypertension, hyperlipidemia, CAD who presents for evaluation of chest pain.  Patient reports that he exerted himself a lot today.  Was working in his yard planting for most of the day.  Had 4 shots of bourbon this evening.  He reports that the pain started before he had the bourbon and after he had meatloaf.  He describes the pain as dull located in the left side of his chest radiating to his back, 8 out of 10, constant, no worse with exertion.  No nausea, vomiting, abdominal pain, fever or chills, cough, shortness of breath.  The pain is nonpleuritic in nature.  Patient denies ever having similar pain.  He took a full aspirin, 3 sublingual nitro without any relief.  When his wife came home this evening he asked her to call 911.  Patient denies ever having a heart attack.  He had a left heart cath  in 2017 showing 50% proximal LAD stenosis, with no medical intervention.  Patient denies paresthesias of his extremities.  No personal family history of blood clots, no recent travel immobilization, no leg pain or swelling, no hemoptysis or exogenous hormones.  Past Medical History:  Diagnosis Date  . Anxiety   . Arthritis   . Asthma   . Colon polyps   . COPD (chronic obstructive pulmonary disease) ([place])   . Depression   . Diabetes ([place])   . Difficult intubation   . GERD (gastroesophageal reflux disease)   . High cholesterol   . Hyperlipidemia   . Hypertension   . Sleep apnea     Patient Active Problem List   Diagnosis Date Noted  . Cholecystitis 05/08/2019  . Paroxysmal tachycardia ([place]) 02/23/2017  . Colon polyp 06/29/2016  . BP (high blood  pressure) 06/29/2016  . Elevated PSA 06/29/2016  . 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 Controlled type 2 diabetes mellitus without complication ([place]) 00/93/8182  . 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 Chronic insomnia 09/03/2015  . COPD exacerbation ([place]) 06/24/2015  . Obstructive apnea 02/13/2015  . Chronic obstructive pulmonary disease with acute exacerbation ([place]) 06/14/2013  . Chronic bronchitis ([place]) 12/14/2011    Past Surgical History:  Procedure Laterality Date  . BRONCHOSCOPY    . CARDIAC CATHETERIZATION Left 11/04/2016   Procedure: Left Heart Cath and Coronary Angiography;  Surgeon: Teodoro Spray, MD;  Location: [place] [facility];  Service: Cardiovascular;  Laterality: Left;  . CARDIAC CATHETERIZATION    . CHOLECYSTECTOMY N/A 05/09/2019   Procedure: LAPAROSCOPIC CHOLECYSTECTOMY WITH INTRAOPERATIVE CHOLANGIOGRAM;  Surgeon: Jules Husbands, MD;  Location: [facility] ORS;  Service: General;  Laterality: N/A;  . COLOSTOMY    . ROTATOR CUFF REPAIR Left   . THUMB ARTHROSCOPY  2015  . TONSILLECTOMY  1952  . TONSILLECTOMY      Prior to Admission medications   Medication Sig Start Date End Date Taking? Authorizing Provider  albuterol (ACCUNEB) 1.25 MG/3ML nebulizer solution TAKE 3 MLS (1.25 MG TOTAL) BY NEBULIZATION EVERY 6 (SIX) HOURS AS NEEDED FOR WHEEZING 04/08/19   [provider]  ALPRAZolam Duanne Moron) 0.5 MG tablet Take 0.5 tablets by mouth daily as needed. 03/05/19   [provider]  amLODipine (  NORVASC) 5 MG tablet Take 5 mg by mouth daily. 03/24/17   [provider]  ascorbic acid (VITAMIN C) 1000 MG tablet Take 1,000 mg by mouth daily.    [provider]  aspirin 81 MG chewable tablet Chew 81 mg by mouth daily.    [provider]  carvedilol (COREG) 12.5 MG tablet Take 12.5 mg by mouth 2 (two) times a day.  04/03/19   [provider]  diltiazem (CARDIZEM CD) 180 MG 24 hr capsule Take 1 capsule by mouth 2  (two) times a day. 05/02/19   [provider]  donepezil (ARICEPT) 5 MG tablet Take by mouth. 05/14/20 05/14/21  [provider]  esomeprazole (NEXIUM) 40 MG capsule Take 40 mg by mouth daily at 12 noon.    [provider]  fluticasone (FLONASE) 50 MCG/ACT nasal spray Place 1 spray into both nostrils daily as needed for allergies or rhinitis.    [provider]  glimepiride (AMARYL) 1 MG tablet Take 1 mg by mouth daily.     [provider]  glucosamine-chondroitin 500-400 MG tablet Take 1 tablet by mouth daily.    [provider]  ibuprofen (ADVIL,MOTRIN) 200 MG tablet Take 200 mg by mouth every 6 (six) hours as needed.    [provider]  metoprolol succinate (TOPROL-XL) 50 MG 24 hr tablet Take 1 tablet by mouth 2 (two) times a day. 04/03/19   [provider]  montelukast (SINGULAIR) 10 MG tablet Take 10 mg by mouth at bedtime.    [provider]  Multiple Vitamins-Minerals (MULTIVITAMIN PO) Take 1 tablet by mouth daily.    [provider]  nitroGLYCERIN (NITROSTAT) 0.4 MG SL tablet Place 0.4 mg under the tongue every 5 (five) minutes as needed for chest pain.    [provider]  PARoxetine (PAXIL) 20 MG tablet Take 20 mg by mouth daily. 05/13/19   [provider]  simvastatin (ZOCOR) 20 MG tablet Take 20 mg by mouth daily.    [provider]  triamterene-hydrochlorothiazide (DYAZIDE) 37.5-25 MG per capsule Take 1 capsule by mouth daily.    [provider]  zolpidem (AMBIEN) 5 MG tablet Take 1 tablet by mouth at bedtime. 04/20/19   [provider]    Allergies Ace inhibitors and Sulfa antibiotics  Family History  Problem Relation Age of Onset  . Heart attack Mother   . Prostate cancer Father   . Leukemia Brother   . Prostate cancer Brother     Social History Social History   Tobacco Use  . Smoking status: Former Smoker    Packs/day: 1.00    Years: 15.00     Pack years: 15.00    Types: Cigarettes  . Smokeless tobacco: Never Used  . Tobacco comment: quit 1977  Substance Use Topics  . Alcohol use: Yes    Alcohol/week: 1.0 - 2.0 standard drinks    Types: 1 - 2 Shots of liquor per week  . Drug use: No    Review of Systems  Constitutional: Negative for fever. Eyes: Negative for visual changes. ENT: Negative for sore throat. Neck: No neck pain  Cardiovascular: + chest pain. Respiratory: Negative for shortness of breath. Gastrointestinal: Negative for abdominal pain, vomiting or diarrhea. Genitourinary: Negative for dysuria. Musculoskeletal: Negative for back pain. Skin: Negative for rash. Neurological: Negative for headaches, weakness or numbness. Psych: No SI or HI  ____________________________________________   PHYSICAL EXAM:  VITAL SIGNS: ED Triage Vitals  Enc Vitals Group  BP 06/01/20 2342 128/77     Pulse Rate 06/01/20 2342 66     Resp 06/01/20 2342 18     Temp 06/01/20 2342 97.6 F (36.4 C)     Temp Source 06/01/20 2342 Oral     SpO2 06/01/20 2342 97 %     Weight 06/01/20 2343 180 lb (81.6 kg)     Height 06/01/20 2343 5\' 5"  (1.651 m)     Head Circumference --      Peak Flow --      Pain Score 06/01/20 2343 8     Pain Loc --      Pain Edu? --      Excl. in [place]? --     Constitutional: Alert and oriented. Well appearing and in no apparent distress. HEENT:      Head: Normocephalic and atraumatic.         Eyes: Conjunctivae are normal. Sclera is non-icteric.       Mouth/Throat: Mucous membranes are moist.       Neck: Supple with no signs of meningismus. Cardiovascular: Regular rate and rhythm. No murmurs, gallops, or rubs. 2+ symmetrical distal pulses are present in all extremities. No JVD. Respiratory: Normal respiratory effort. Lungs are clear to auscultation bilaterally. No wheezes, crackles, or rhonchi.  Gastrointestinal: Soft, non tender, and non distended with positive bowel sounds. No rebound or  guarding. Musculoskeletal: Trace edema bilateral lower extremities. Neurologic: Normal speech and language. Face is symmetric. Moving all extremities. No gross focal neurologic deficits are appreciated. Skin: Skin is warm, dry and intact. No rash noted. Psychiatric: Mood and affect are normal. Speech and behavior are normal.  ____________________________________________   LABS (all labs ordered are listed, but only abnormal results are displayed)  Labs Reviewed  COMPREHENSIVE METABOLIC PANEL - Abnormal; Notable for the following components:      Result Value   Glucose, Bld 156 (*)    Creatinine, Ser 1.34 (*)    Calcium 8.8 (*)    GFR calc non Af Amer 51 (*)    GFR calc Af Amer 59 (*)    All other components within normal limits  LIPASE, BLOOD - Abnormal; Notable for the following components:   Lipase 63 (*)    All other components within normal limits  CBC WITH DIFFERENTIAL/PLATELET - Abnormal; Notable for the following components:   Hemoglobin 17.1 (*)    All other components within normal limits  ETHANOL - Abnormal; Notable for the following components:   Alcohol, Ethyl (B) 162 (*)    All other components within normal limits  TROPONIN I (HIGH SENSITIVITY)  TROPONIN I (HIGH SENSITIVITY)   ____________________________________________  EKG  ED ECG REPORT I, Rudene Re, the attending physician, personally viewed and interpreted this ECG.  Normal sinus rhythm, rate of 69, normal intervals, normal axis, no ST elevations or depressions, anterior Q waves.  Unchanged from prior. ____________________________________________  RADIOLOGY  I have personally reviewed the images performed during this visit and I agree with the Radiologist's read.   Interpretation by Radiologist:  DG Chest Portable 1 View  Result Date: 06/02/2020 CLINICAL DATA:  Chest pain EXAM: PORTABLE CHEST 1 VIEW COMPARISON:  02/05/2019 FINDINGS: The heart size and mediastinal contours are within normal  limits. Both lungs are clear. The visualized skeletal structures are unremarkable. IMPRESSION: No active disease. Electronically Signed   By: Constance Holster M.D.   On: 06/02/2020 00:21      ____________________________________________   PROCEDURES  Procedure(s) performed:yes .1-3 Lead EKG Interpretation  Performed by: Rudene Re, MD Authorized by: Rudene Re, MD     Interpretation: normal     ECG rate assessment: normal     Rhythm: sinus rhythm     Ectopy: none     Conduction: normal     Critical Care performed: yes  CRITICAL CARE Performed by: Rudene Re  ?  Total critical care time: 35 min  Critical care time was exclusive of separately billable procedures and treating other patients.  Critical care was necessary to treat or prevent imminent or life-threatening deterioration.  Critical care was time spent personally by me on the following activities: development of treatment plan with patient and/or surrogate as well as nursing, discussions with consultants, evaluation of patient's response to treatment, examination of patient, obtaining history from patient or surrogate, ordering and performing treatments and interventions, ordering and review of laboratory studies, ordering and review of radiographic studies, pulse oximetry and re-evaluation of patient's condition.  ____________________________________________   INITIAL IMPRESSION / ASSESSMENT AND PLAN / ED COURSE   79 y.o. male with a history of COPD, asthma, diabetes, GERD, hypertension, hyperlipidemia, CAD who presents for evaluation of constant dull L sided chest pain radiating to his back x 3 hours unchanged with nitro and ASA.   Ddx ACS vs MSK vs GERD vs gastritis versus pancreatitis vs PE vs dissection  Patient is well-appearing in no distress with normal vital signs, pulses are equal and strong in all 4 extremities, bilateral trace pitting edema, palpation of the chest wall does not  reproduce the pain, lungs are clear to auscultation, heart regular rate and rhythm.  EKG showing no dysrhythmias or acute ischemia.  Patient placed on telemetry for close monitoring.  Old medical records reviewed.  History gathered from patient and EMS.  Patient has already received a full aspirin this evening for his chest pain.  Will give IV morphine, IV Pepcid, IV zofran for symptom relief.  Will check labs including CBC, CMP, lipase, troponin x2, chest x-ray.  Will monitor patient closely on telemetry.  At this point unlikely PE with no tachypnea, tachycardia, hypoxia, non pleuritic chest pain, no risk factors for PE. Aortic dissection considered but thought less likely, with no typical symptoms of chest pain radiating through to intrascapular back, no severe hypertension, symmetric bilateral radial pulses, no associated neurologic deficits, no associated abdominal or lower extremity symptoms, no marfanoid features or evidence of underlying connective tissue disorder, and chest x-ray without evidence of mediastinal widening.  _________________________ 1:03 AM on 06/02/2020 -----------------------------------------  Patient continues to complain of pain. Troponin x 1 negative. Mildly elevated lipase with normal LFTs, patient sp cholecystectomy. Will get CT c/a/p.   _________________________ 3:51 AM on 06/02/2020 -----------------------------------------  CT with no acute findings, visualized by me and confirmed by radiology.  Pain resolved.  Second troponin negative.  Patient is wife also reports the pain started after patient had meatloaf.  Possibly reflux however with a history of a 50% stenosis of the LAD on a left heart cath 4 years ago recommended close follow up with Cardiology. Patient has a cardiologist at [facility] however he is requesting referral to a new one since he does not like the bedside manner of the his current cardiologist.  He has requested to see Dr. Saralyn Pilar.  I have given Dr.  Sharlet Salina phone number and recommended that he call us first thing in the morning for a follow-up.  Recommended return to the emergency room if the chest pain recurs.  Work-up, results, follow-up, and return  recommendations have been discussed with patient and his wife.       _____________________________________________ Please note:  Patient was evaluated in Emergency Department today for the symptoms described in the history of present illness. Patient was evaluated in the context of the global COVID-19 pandemic, which necessitated consideration that the patient might be at risk for infection with the SARS-CoV-2 virus that causes COVID-19. Institutional protocols and algorithms that pertain to the evaluation of patients at risk for COVID-19 are in a state of rapid change based on information released by regulatory bodies including the CDC and federal and state organizations. These policies and algorithms were followed during the patient's care in the ED.  Some ED evaluations and interventions may be delayed as a result of limited staffing during the pandemic.   [place] Controlled Substance Database was reviewed by me. ____________________________________________   FINAL CLINICAL IMPRESSION(S) / ED DIAGNOSES   Final diagnoses:  Chest pain, unspecified type  Gastroesophageal reflux disease, unspecified whether esophagitis present      NEW MEDICATIONS STARTED DURING THIS VISIT:  ED Discharge Orders    None       Note:  This document was prepared using Dragon voice recognition software and may include unintentional dictation errors.    Alfred Levins, [place], MD 06/02/20 [phone]

## 2020-06-01 NOTE — ED Triage Notes (Signed)
Pt presents to ED from home via EMS with c/o dull left sided chest pain that radiates into his back for the past 3 hours. Pt reports he had been working in his yard/garden all day prior to the onset of his symptoms. Pt has cardiac hx and is scheduled to start cardiac rehab tomorrow. Pt took 3 nitro and 4 baby aspirin at home with no relief. +ETOH this evening. EMS VS  BP111/52  HR 70 O2 97% FSBS 153 Pt talkative and denies sob, n/v. Answering all questions without difficulty. Pt is hard of hearing.

## 2020-06-02 ENCOUNTER — Encounter: Payer: Self-pay | Admitting: Radiology

## 2020-06-02 ENCOUNTER — Emergency Department: Payer: Medicare Other

## 2020-06-02 LAB — CBC WITH DIFFERENTIAL/PLATELET
Abs Immature Granulocytes: 0.01 10*3/uL (ref 0.00–0.07)
Basophils Absolute: 0.1 10*3/uL (ref 0.0–0.1)
Basophils Relative: 1 %
Eosinophils Absolute: 0.2 10*3/uL (ref 0.0–0.5)
Eosinophils Relative: 4 %
HCT: 49.8 % (ref 39.0–52.0)
Hemoglobin: 17.1 g/dL — ABNORMAL HIGH (ref 13.0–17.0)
Immature Granulocytes: 0 %
Lymphocytes Relative: 22 %
Lymphs Abs: 1.3 10*3/uL (ref 0.7–4.0)
MCH: 32.9 pg (ref 26.0–34.0)
MCHC: 34.3 g/dL (ref 30.0–36.0)
MCV: 96 fL (ref 80.0–100.0)
Monocytes Absolute: 1 10*3/uL (ref 0.1–1.0)
Monocytes Relative: 17 %
Neutro Abs: 3.3 10*3/uL (ref 1.7–7.7)
Neutrophils Relative %: 56 %
Platelets: 199 10*3/uL (ref 150–400)
RBC: 5.19 MIL/uL (ref 4.22–5.81)
RDW: 13.2 % (ref 11.5–15.5)
WBC: 5.9 10*3/uL (ref 4.0–10.5)
nRBC: 0 % (ref 0.0–0.2)

## 2020-06-02 LAB — ETHANOL: Alcohol, Ethyl (B): 162 mg/dL — ABNORMAL HIGH (ref <10)

## 2020-06-02 LAB — COMPREHENSIVE METABOLIC PANEL
ALT: 29 U/L (ref 0–44)
AST: 37 U/L (ref 15–41)
Albumin: 3.5 g/dL (ref 3.5–5.0)
Alkaline Phosphatase: 83 U/L (ref 38–126)
Anion gap: 14 (ref 5–15)
BUN: 22 mg/dL (ref 8–23)
CO2: 22 mmol/L (ref 22–32)
Calcium: 8.8 mg/dL — ABNORMAL LOW (ref 8.9–10.3)
Chloride: 99 mmol/L (ref 98–111)
Creatinine, Ser: 1.34 mg/dL — ABNORMAL HIGH (ref 0.61–1.24)
GFR calc Af Amer: 59 mL/min — ABNORMAL LOW (ref >60)
GFR calc non Af Amer: 51 mL/min — ABNORMAL LOW (ref >60)
Glucose, Bld: 156 mg/dL — ABNORMAL HIGH (ref 70–99)
Potassium: 3.7 mmol/L (ref 3.5–5.1)
Sodium: 135 mmol/L (ref 135–145)
Total Bilirubin: 0.6 mg/dL (ref 0.3–1.2)
Total Protein: 6.7 g/dL (ref 6.5–8.1)

## 2020-06-02 LAB — TROPONIN I (HIGH SENSITIVITY)
Troponin I (High Sensitivity): 5 ng/L (ref <18)
Troponin I (High Sensitivity): 5 ng/L (ref <18)

## 2020-06-02 LAB — LIPASE, BLOOD: Lipase: 63 U/L — ABNORMAL HIGH (ref 11–51)

## 2020-06-02 MED ORDER — IOHEXOL 350 MG/ML SOLN
100.0000 mL | Freq: Once | INTRAVENOUS | Status: AC | PRN
  Administered 2020-06-02: 100 mL via INTRAVENOUS

## 2020-06-02 MED ORDER — ALPRAZOLAM 0.5 MG PO TABS
1.0000 mg | ORAL_TABLET | Freq: Once | ORAL | Status: DC
  Filled 2020-06-02: qty 2

## 2020-06-02 NOTE — Discharge Instructions (Signed)

## 2020-06-02 NOTE — ED Notes (Signed)
Pt to CT

## 2020-06-08 ENCOUNTER — Other Ambulatory Visit: Payer: Self-pay

## 2020-06-08 VITALS — Ht 65.25 in | Wt 176.2 lb

## 2020-06-08 DIAGNOSIS — I208 Other forms of angina pectoris: Secondary | ICD-10-CM | POA: Diagnosis not present

## 2020-06-08 NOTE — Progress Notes (Signed)
Cardiac Individual Treatment Plan  Patient Details  Name: Brett Blake MRN: [id number] Date of Birth: [birth year] Referring Provider:     Cardiac Rehab from 06/08/2020 in [facility] Cardiac and Pulmonary Rehab  Referring Provider [name]      Initial Encounter Date:    Cardiac Rehab from 06/08/2020 in [facility] Cardiac and Pulmonary Rehab  Date 06/08/20      Visit Diagnosis: Chronic stable angina ([place])  Patient's Home Medications on Admission:  Current Outpatient Medications:  .  albuterol (ACCUNEB) 1.25 MG/3ML nebulizer solution, TAKE 3 MLS (1.25 MG TOTAL) BY NEBULIZATION EVERY 6 (SIX) HOURS AS NEEDED FOR WHEEZING, Disp: , Rfl:  .  ALPRAZolam (XANAX) 0.5 MG tablet, Take 0.5 tablets by mouth daily as needed., Disp: , Rfl:  .  amLODipine (NORVASC) 5 MG tablet, Take 5 mg by mouth daily., Disp: , Rfl: 3 .  ascorbic acid (VITAMIN C) 1000 MG tablet, Take 1,000 mg by mouth daily., Disp: , Rfl:  .  aspirin 81 MG chewable tablet, Chew 81 mg by mouth daily., Disp: , Rfl:  .  carvedilol (COREG) 12.5 MG tablet, Take 12.5 mg by mouth 2 (two) times a day. , Disp: , Rfl:  .  diltiazem (CARDIZEM CD) 180 MG 24 hr capsule, Take 1 capsule by mouth 2 (two) times a day., Disp: , Rfl:  .  donepezil (ARICEPT) 5 MG tablet, Take by mouth., Disp: , Rfl:  .  esomeprazole (NEXIUM) 40 MG capsule, Take 40 mg by mouth daily at 12 noon., Disp: , Rfl:  .  fluticasone (FLONASE) 50 MCG/ACT nasal spray, Place 1 spray into both nostrils daily as needed for allergies or rhinitis., Disp: , Rfl:  .  glimepiride (AMARYL) 1 MG tablet, Take 1 mg by mouth daily. , Disp: , Rfl:  .  glucosamine-chondroitin 500-400 MG tablet, Take 1 tablet by mouth daily., Disp: , Rfl:  .  ibuprofen (ADVIL,MOTRIN) 200 MG tablet, Take 200 mg by mouth every 6 (six) hours as needed., Disp: , Rfl:  .  metoprolol succinate (TOPROL-XL) 50 MG 24 hr tablet, Take 1 tablet by mouth 2 (two) times a day., Disp: , Rfl:  .  montelukast (SINGULAIR) 10 MG tablet, Take 10  mg by mouth at bedtime., Disp: , Rfl:  .  Multiple Vitamins-Minerals (MULTIVITAMIN PO), Take 1 tablet by mouth daily., Disp: , Rfl:  .  nitroGLYCERIN (NITROSTAT) 0.4 MG SL tablet, Place 0.4 mg under the tongue every 5 (five) minutes as needed for chest pain., Disp: , Rfl:  .  PARoxetine (PAXIL) 20 MG tablet, Take 20 mg by mouth daily., Disp: , Rfl:  .  simvastatin (ZOCOR) 20 MG tablet, Take 20 mg by mouth daily., Disp: , Rfl:  .  triamterene-hydrochlorothiazide (DYAZIDE) 37.5-25 MG per capsule, Take 1 capsule by mouth daily., Disp: , Rfl:  .  zolpidem (AMBIEN) 5 MG tablet, Take 1 tablet by mouth at bedtime., Disp: , Rfl:   Past Medical History: Past Medical History:  Diagnosis Date  . Anxiety   . Arthritis   . Asthma   . Colon polyps   . COPD (chronic obstructive pulmonary disease) ([place])   . Depression   . Diabetes ([place])   . Difficult intubation   . GERD (gastroesophageal reflux disease)   . High cholesterol   . Hyperlipidemia   . Hypertension   . Sleep apnea     Tobacco Use: Social History   Tobacco Use  Smoking Status Former Smoker  . Packs/day: 1.00  . Years: 15.00  .  Pack years: 15.00  . Types: Cigarettes  Smokeless Tobacco Never Used  Tobacco Comment   quit 1977    Labs: Recent Review Flowsheet Data    Labs for ITP Cardiac and Pulmonary Rehab Latest Ref Rng & Units 06/26/2015 06/27/2015 05/08/2019   Hemoglobin A1c 4.8 - 5.6 % 5.9 6.1(H) 5.9(H)       Exercise Target Goals: Exercise Program Goal: Individual exercise prescription set using results from initial 6 min walk test and THRR while considering  patient's activity barriers and safety.   Exercise Prescription Goal: Initial exercise prescription builds to 30-45 minutes a day of aerobic activity, 2-3 days per week.  Home exercise guidelines will be given to patient during program as part of exercise prescription that the participant will acknowledge.   Education: Aerobic Exercise & Resistance Training: -  Gives group verbal and written instruction on the various components of exercise. Focuses on aerobic and resistive training programs and the benefits of this training and how to safely progress through these programs..   Cardiac Rehab from 07/17/2017 in [facility] Cardiac and Pulmonary Rehab  Date 07/05/17  Educator AS  Instruction Review Code (retired) 2- meets goals/outcomes      Education: Exercise & Equipment Safety: - Individual verbal instruction and demonstration of equipment use and safety with use of the equipment.   Cardiac Rehab from 06/08/2020 in [facility] Cardiac and Pulmonary Rehab  Date 06/08/20  Educator AS  Instruction Review Code 1- Verbalizes Understanding      Education: Exercise Physiology & General Exercise Guidelines: - Group verbal and written instruction with models to review the exercise physiology of the cardiovascular system and associated critical values. Provides general exercise guidelines with specific guidelines to those with heart or lung disease.    Cardiac Rehab from 06/08/2020 in [facility] Cardiac and Pulmonary Rehab  Date 06/08/20  Educator AS  Instruction Review Code 1- Verbalizes Understanding      Education: Flexibility, Balance, Mind/Body Relaxation: Provides group verbal/written instruction on the benefits of flexibility and balance training, including mind/body exercise modes such as yoga, pilates and tai chi.  Demonstration and skill practice provided.   Cardiac Rehab from 07/17/2017 in [facility] Cardiac and Pulmonary Rehab  Date 07/10/17  Educator AS  Instruction Review Code (retired) 2- meets goals/outcomes      Activity Barriers & Risk Stratification:  Activity Barriers & Cardiac Risk Stratification - 05/28/20 1346      Activity Barriers & Cardiac Risk Stratification   Activity Barriers Chest Pain/Angina;Deconditioning    Cardiac Risk Stratification High           6 Minute Walk:  6 Minute Walk    Row Name 06/08/20 1624         6 Minute Walk     Phase Initial     Distance 1300 feet     Walk Time 6 minutes     # of Rest Breaks 0     MPH 2.46     METS 2.26     RPE 17     Perceived Dyspnea  2     VO2 Peak 7.9     Symptoms Yes (comment)     Comments L side - has asthma - ha sspoken to Dr [name] - has treatment about 1x per year     Resting HR 87 bpm     Resting BP 112/64     Resting Oxygen Saturation  96 %     Exercise Oxygen Saturation  during 6 min walk 96 %  Max Ex. HR 101 bpm     Max Ex. BP 112/66     2 Minute Post BP 112/68            Oxygen Initial Assessment:   Oxygen Re-Evaluation:   Oxygen Discharge (Final Oxygen Re-Evaluation):   Initial Exercise Prescription:  Initial Exercise Prescription - 06/08/20 1600      Date of Initial Exercise RX and Referring Provider   Date 06/08/20    Referring Provider [name]      Treadmill   MPH 2    Grade 0    Minutes 15    METs 2.5      Recumbant Bike   Level 1    RPM 60    Minutes 15    METs 2.5      NuStep   Level 1    SPM 80    Minutes 15    METs 2.5      Recumbant Elliptical   Level 1    RPM 50    Minutes 15    METs 2.5      REL-XR   Level 1    Speed 50    Minutes 15    METs 2.5      Prescription Details   Frequency (times per week) 3    Duration Progress to 30 minutes of continuous aerobic without signs/symptoms of physical distress      Intensity   THRR 40-80% of Max Heartrate 109-132    Ratings of Perceived Exertion 11-13    Perceived Dyspnea 0-4      Resistance Training   Training Prescription Yes    Weight 3 lb    Reps 10-15           Perform Capillary Blood Glucose checks as needed.  Exercise Prescription Changes:  Exercise Prescription Changes    Row Name 06/08/20 1600             Response to Exercise   Blood Pressure (Admit) 112/64       Blood Pressure (Exercise) 112/66       Blood Pressure (Exit) 112/68       Heart Rate (Admit) 87 bpm       Heart Rate (Exercise) 101 bpm       Heart Rate (Exit) 89  bpm       Oxygen Saturation (Admit) 96 %       Oxygen Saturation (Exercise) 96 %       Rating of Perceived Exertion (Exercise) 17       Perceived Dyspnea (Exercise) 2       Symptoms L side due to asthman              Exercise Comments:   Exercise Goals and Review:  Exercise Goals    Row Name 06/08/20 1630             Exercise Goals   Increase Physical Activity Yes       Intervention Provide advice, education, support and counseling about physical activity/exercise needs.;Develop an individualized exercise prescription for aerobic and resistive training based on initial evaluation findings, risk stratification, comorbidities and participant's personal goals.       Expected Outcomes Short Term: Attend rehab on a regular basis to increase amount of physical activity.;Long Term: Add in home exercise to make exercise part of routine and to increase amount of physical activity.;Long Term: Exercising regularly at least 3-5 days a week.       Increase Strength and  Stamina Yes       Intervention Provide advice, education, support and counseling about physical activity/exercise needs.;Develop an individualized exercise prescription for aerobic and resistive training based on initial evaluation findings, risk stratification, comorbidities and participant's personal goals.       Expected Outcomes Short Term: Increase workloads from initial exercise prescription for resistance, speed, and METs.;Short Term: Perform resistance training exercises routinely during rehab and add in resistance training at home;Long Term: Improve cardiorespiratory fitness, muscular endurance and strength as measured by increased METs and functional capacity (6MWT)       Able to understand and use rate of perceived exertion (RPE) scale Yes       Intervention Provide education and explanation on how to use RPE scale       Expected Outcomes Short Term: Able to use RPE daily in rehab to express subjective intensity level;Long  Term:  Able to use RPE to guide intensity level when exercising independently       Able to understand and use Dyspnea scale Yes       Intervention Provide education and explanation on how to use Dyspnea scale       Expected Outcomes Short Term: Able to use Dyspnea scale daily in rehab to express subjective sense of shortness of breath during exertion;Long Term: Able to use Dyspnea scale to guide intensity level when exercising independently       Knowledge and understanding of Target Heart Rate Range (THRR) Yes       Intervention Provide education and explanation of THRR including how the numbers were predicted and where they are located for reference       Expected Outcomes Short Term: Able to state/look up THRR;Short Term: Able to use daily as guideline for intensity in rehab;Long Term: Able to use THRR to govern intensity when exercising independently       Able to check pulse independently Yes       Intervention Provide education and demonstration on how to check pulse in carotid and radial arteries.;Review the importance of being able to check your own pulse for safety during independent exercise       Expected Outcomes Short Term: Able to explain why pulse checking is important during independent exercise;Long Term: Able to check pulse independently and accurately       Understanding of Exercise Prescription Yes       Intervention Provide education, explanation, and written materials on patient's individual exercise prescription       Expected Outcomes Short Term: Able to explain program exercise prescription;Long Term: Able to explain home exercise prescription to exercise independently              Exercise Goals Re-Evaluation :   Discharge Exercise Prescription (Final Exercise Prescription Changes):  Exercise Prescription Changes - 06/08/20 1600      Response to Exercise   Blood Pressure (Admit) 112/64    Blood Pressure (Exercise) 112/66    Blood Pressure (Exit) 112/68    Heart  Rate (Admit) 87 bpm    Heart Rate (Exercise) 101 bpm    Heart Rate (Exit) 89 bpm    Oxygen Saturation (Admit) 96 %    Oxygen Saturation (Exercise) 96 %    Rating of Perceived Exertion (Exercise) 17    Perceived Dyspnea (Exercise) 2    Symptoms L side due to asthman           Nutrition:  Target Goals: Understanding of nutrition guidelines, daily intake of sodium <1529m, cholesterol <2029m calories  30% from fat and 7% or less from saturated fats, daily to have 5 or more servings of fruits and vegetables.  Education: Controlling Sodium/Reading Food Labels -Group verbal and written material supporting the discussion of sodium use in heart healthy nutrition. Review and explanation with models, verbal and written materials for utilization of the food label.   Cardiac Rehab from 07/17/2017 in [facility] Cardiac and Pulmonary Rehab  Date 06/26/17  Educator PI  Instruction Review Code (retired) 2- meets goals/outcomes      Education: General Nutrition Guidelines/Fats and Fiber: -Group instruction provided by verbal, written material, models and posters to present the general guidelines for heart healthy nutrition. Gives an explanation and review of dietary fats and fiber.   Cardiac Rehab from 07/17/2017 in [facility] Cardiac and Pulmonary Rehab  Date 06/19/17  Educator PI  Instruction Review Code (retired) 2- meets goals/outcomes      Biometrics:  Pre Biometrics - 06/08/20 1631      Pre Biometrics   Height 5' 5.25" (1.657 m)    Weight 176 lb 3.2 oz (79.9 kg)    BMI (Calculated) 29.11    Single Leg Stand 3.87 seconds            Nutrition Therapy Plan and Nutrition Goals:   Nutrition Assessments:   MEDIFICTS Score Key:          ?70 Need to make dietary changes          40-70 Heart Healthy Diet         ? 40 Therapeutic Level Cholesterol Diet  Nutrition Goals Re-Evaluation:   Nutrition Goals Discharge (Final Nutrition Goals Re-Evaluation):   Psychosocial: Target Goals:  Acknowledge presence or absence of significant depression and/or stress, maximize coping skills, provide positive support system. Participant is able to verbalize types and ability to use techniques and skills needed for reducing stress and depression.   Education: Depression - Provides group verbal and written instruction on the correlation between heart/lung disease and depressed mood, treatment options, and the stigmas associated with seeking treatment.   Cardiac Rehab from 07/17/2017 in [facility] Cardiac and Pulmonary Rehab  Date 06/21/17  Educator Lucianne Lei, MSW  Instruction Review Code (retired) 2- meets goals/outcomes      Education: Sleep Hygiene -Provides group verbal and written instruction about how sleep can affect your health.  Define sleep hygiene, discuss sleep cycles and impact of sleep habits. Review good sleep hygiene tips.     Education: Stress and Anxiety: - Provides group verbal and written instruction about the health risks of elevated stress and causes of high stress.  Discuss the correlation between heart/lung disease and anxiety and treatment options. Review healthy ways to manage with stress and anxiety.    Initial Review & Psychosocial Screening:  Initial Psych Review & Screening - 05/28/20 1348      Initial Review   Current issues with Current Stress Concerns    Comments anxiety about what is happening to our country. Concerned his memory is not good. Has med to help      Family Dynamics   Good Support System? Yes   wife, daughter,granddaughter lives at home with Faustino, church family, neighbors     Barriers   Psychosocial barriers to participate in program There are no identifiable barriers or psychosocial needs.;The patient should benefit from training in stress management and relaxation.      Screening Interventions   Interventions Encouraged to exercise;To provide support and resources with identified psychosocial needs;Provide feedback about the  scores  to participant    Expected Outcomes Short Term goal: Utilizing psychosocial counselor, staff and physician to assist with identification of specific Stressors or current issues interfering with healing process. Setting desired goal for each stressor or current issue identified.;Long Term Goal: Stressors or current issues are controlled or eliminated.;Short Term goal: Identification and review with participant of any Quality of Life or Depression concerns found by scoring the questionnaire.;Long Term goal: The participant improves quality of Life and PHQ9 Scores as seen by post scores and/or verbalization of changes           Quality of Life Scores:   Scores of 19 and below usually indicate a poorer quality of life in these areas.  A difference of  2-3 points is a clinically meaningful difference.  A difference of 2-3 points in the total score of the Quality of Life Index has been associated with significant improvement in overall quality of life, self-image, physical symptoms, and general health in studies assessing change in quality of life.  PHQ-9: Recent Review Flowsheet Data    Depression screen [facility] 2/9 05/08/2017   Decreased Interest 1   Down, Depressed, Hopeless 0   PHQ - 2 Score 1   Altered sleeping 2    Tired, decreased energy 2   Change in appetite 3    Feeling bad or failure about yourself  0   Trouble concentrating 1   Moving slowly or fidgety/restless 0   Suicidal thoughts 0   PHQ-9 Score 9   Difficult doing work/chores Somewhat difficult      Interpretation of Total Score  Total Score Depression Severity:  1-4 = Minimal depression, 5-9 = Mild depression, 10-14 = Moderate depression, 15-19 = Moderately severe depression, 20-27 = Severe depression   Psychosocial Evaluation and Intervention:  Psychosocial Evaluation - 05/28/20 1359      Psychosocial Evaluation & Interventions   Interventions Encouraged to exercise with the program and follow exercise  prescription;Stress management education    Comments Onalee Hua ahs not barriers to attending the program. He is glad to have a referral so he can return to his exercise regimen to help with his weight loss and to keep his angina symptoms under control.  DAvid does have some anxiety at times. He expressed concern about our country and what is happening in the country. He also had concern about his memory failing at times and did talk to Dr Hyacinth Meeker and has been prescribed Aricept. DAvid lives in his home with his wife, their saughter and a granddaughter. His granddaughter is a second year Theatre stage manager, so she is home on weekends and school breaks.  Onalee Hua will do well in the program. He remembers how much it helped him when he was here in 2018 and is looking forward to the same results this time.    Expected Outcomes STG; Onalee Hua will be consistent in attendance and progress well with his exercise. LTG: Onalee Hua will be able to maintain his lifestyle chages after discharge for the program    Continue Psychosocial Services  Follow up required by staff           Psychosocial Re-Evaluation:   Psychosocial Discharge (Final Psychosocial Re-Evaluation):   Vocational Rehabilitation: Provide vocational rehab assistance to qualifying candidates.   Vocational Rehab Evaluation & Intervention:  Vocational Rehab - 05/28/20 1352      Initial Vocational Rehab Evaluation & Intervention   Assessment shows need for Vocational Rehabilitation No           Education:  Education Goals: Education classes will be provided on a variety of topics geared toward better understanding of heart health and risk factor modification. Participant will state understanding/return demonstration of topics presented as noted by education test scores.  Learning Barriers/Preferences:  Learning Barriers/Preferences - 05/28/20 1352      Learning Barriers/Preferences   Learning Barriers Hearing    Learning Preferences Individual  Instruction           General Cardiac Education Topics:  AED/CPR: - Group verbal and written instruction with the use of models to demonstrate the basic use of the AED with the basic ABC's of resuscitation.   Anatomy & Physiology of the Heart: - Group verbal and written instruction and models provide basic cardiac anatomy and physiology, with the coronary electrical and arterial systems. Review of Valvular disease and Heart Failure   Cardiac Rehab from 07/17/2017 in [facility] Cardiac and Pulmonary Rehab  Date 07/17/17  Educator [facility]  Instruction Review Code (retired) 2- meets goals/outcomes      Cardiac Procedures: - Group verbal and written instruction to review commonly prescribed medications for heart disease. Reviews the medication, class of the drug, and side effects. Includes the steps to properly store meds and maintain the prescription regimen. (beta blockers and nitrates)   Cardiac Rehab from 07/17/2017 in [facility] Cardiac and Pulmonary Rehab  Date 05/29/17  Educator SB  Instruction Review Code (retired) 2- meets goals/outcomes      Cardiac Medications I: - Group verbal and written instruction to review commonly prescribed medications for heart disease. Reviews the medication, class of the drug, and side effects. Includes the steps to properly store meds and maintain the prescription regimen.   Cardiac Rehab from 07/17/2017 in [facility] Cardiac and Pulmonary Rehab  Date 06/05/17  Marisue Humble 2]  Educator SB  Instruction Review Code (retired) 2- meets goals/outcomes      Cardiac Medications II: -Group verbal and written instruction to review commonly prescribed medications for heart disease. Reviews the medication, class of the drug, and side effects. (all other drug classes)    Go Sex-Intimacy & Heart Disease, Get SMART - Goal Setting: - Group verbal and written instruction through game format to discuss heart disease and the return to sexual intimacy. Provides group verbal and written  material to discuss and apply goal setting through the application of the S.M.A.R.T. Method.   Cardiac Rehab from 07/17/2017 in [facility] Cardiac and Pulmonary Rehab  Date 05/29/17  Educator SB  Instruction Review Code (retired) 2- meets goals/outcomes      Other Matters of the Heart: - Provides group verbal, written materials and models to describe Stable Angina and Peripheral Artery. Includes description of the disease process and treatment options available to the cardiac patient.   Cardiac Rehab from 07/17/2017 in [facility] Cardiac and Pulmonary Rehab  Date 07/17/17  Educator [facility]  Instruction Review Code (retired) 2- meets goals/outcomes      Infection Prevention: - Provides verbal and written material to individual with discussion of infection control including proper hand washing and proper equipment cleaning during exercise session.   Cardiac Rehab from 06/08/2020 in [facility] Cardiac and Pulmonary Rehab  Date 06/08/20  Educator AS  Instruction Review Code 1- Verbalizes Understanding      Falls Prevention: - Provides verbal and written material to individual with discussion of falls prevention and safety.   Cardiac Rehab from 07/17/2017 in [facility] Cardiac and Pulmonary Rehab  Date 05/08/17  Educator SB  Instruction Review Code (retired) 2- meets goals/outcomes  Other: -Provides group and verbal instruction on various topics (see comments)   Knowledge Questionnaire Score:   Core Components/Risk Factors/Patient Goals at Admission:  Personal Goals and Risk Factors at Admission - 05/28/20 1353      Core Components/Risk Factors/Patient Goals on Admission   Intervention Weight Management: Develop a combined nutrition and exercise program designed to reach desired caloric intake, while maintaining appropriate intake of nutrient and fiber, sodium and fats, and appropriate energy expenditure required for the weight goal.;Weight Management/Obesity: Establish reasonable short term and long  term weight goals.;Obesity: Provide education and appropriate resources to help participant work on and attain dietary goals.    Admit Weight 186 lb (84.4 kg)    Goal Weight: Short Term 184 lb (83.5 kg)    Goal Weight: Long Term 180 lb (81.6 kg)    Expected Outcomes Short Term: Continue to assess and modify interventions until short term weight is achieved;Long Term: Adherence to nutrition and physical activity/exercise program aimed toward attainment of established weight goal;Weight Loss: Understanding of general recommendations for a balanced deficit meal plan, which promotes 1-2 lb weight loss per week and includes a negative energy balance of [phone] kcal/d    Diabetes Yes    Intervention Provide education about signs/symptoms and action to take for hypo/hyperglycemia.;Provide education about proper nutrition, including hydration, and aerobic/resistive exercise prescription along with prescribed medications to achieve blood glucose in normal ranges: Fasting glucose 65-99 mg/dL    Expected Outcomes Short Term: Participant verbalizes understanding of the signs/symptoms and immediate care of hyper/hypoglycemia, proper foot care and importance of medication, aerobic/resistive exercise and nutrition plan for blood glucose control.;Long Term: Attainment of HbA1C < 7%.    Hypertension Yes    Intervention Provide education on lifestyle modifcations including regular physical activity/exercise, weight management, moderate sodium restriction and increased consumption of fresh fruit, vegetables, and low fat dairy, alcohol moderation, and smoking cessation.;Monitor prescription use compliance.    Expected Outcomes Short Term: Continued assessment and intervention until BP is < 140/86m HG in hypertensive participants. < 130/817mHG in hypertensive participants with diabetes, heart failure or chronic kidney disease.;Long Term: Maintenance of blood pressure at goal levels.    Lipids Yes    Intervention Provide  education and support for participant on nutrition & aerobic/resistive exercise along with prescribed medications to achieve LDL '70mg'$ , HDL >'40mg'$ .    Expected Outcomes Short Term: Participant states understanding of desired cholesterol values and is compliant with medications prescribed. Participant is following exercise prescription and nutrition guidelines.;Long Term: Cholesterol controlled with medications as prescribed, with individualized exercise RX and with personalized nutrition plan. Value goals: LDL < '70mg'$ , HDL > 40 mg.           Education:Diabetes - Individual verbal and written instruction to review signs/symptoms of diabetes, desired ranges of glucose level fasting, after meals and with exercise. Acknowledge that pre and post exercise glucose checks will be done for 3 sessions at entry of program.   [facility]  Date 05/08/17  [facility] ([facility]ired) 2- meets goals/outcomes      Education: Know Your Numbers and Risk Factors: -Group verbal and written instruction about important numbers in your health.  Discussion of what are risk factors and how they play a role in the disease process.  Review of Cholesterol, Blood Pressure, Diabetes, and BMI and the role they play in your overall health.   Core Components/Risk Factors/Patient Goals Review:    Core  Components/Risk Factors/Patient Goals at Discharge (Final Review):    ITP Comments:  ITP Comments    Row Name 05/28/20 1425 06/08/20 1631         ITP Comments Virtual Orientation completed today. DAvis has appt on 6/8 at 930 AM for EP eval and gym orientation. Documentation of diagnosis can be found in [facility]5/20/2021 office visit. Completed 6MWT and gym orientation.  Initial ITP created and sent for review to Dr. Emily Filbert, Medical Director.             Comments: initial ITP

## 2020-06-08 NOTE — Patient Instructions (Addendum)
Patient Instructions  Patient Details  Name: Brett Blake MRN: [id number] Date of Birth: 03/18/1941 Referring Provider:  [name], MD  Below are your personal goals for exercise, nutrition, and risk factors. Our goal is to help you stay on track towards obtaining and maintaining these goals. We will be discussing your progress on these goals with you throughout the program.  Initial Exercise Prescription:  Initial Exercise Prescription - 06/08/20 1600      Date of Initial Exercise RX and Referring Provider   Date 06/08/20    Referring Provider [name]      Treadmill   MPH 2    Grade 0    Minutes 15    METs 2.5      Recumbant Bike   Level 1    RPM 60    Minutes 15    METs 2.5      NuStep   Level 1    SPM 80    Minutes 15    METs 2.5      Recumbant Elliptical   Level 1    RPM 50    Minutes 15    METs 2.5      REL-XR   Level 1    Speed 50    Minutes 15    METs 2.5      Prescription Details   Frequency (times per week) 3    Duration Progress to 30 minutes of continuous aerobic without signs/symptoms of physical distress      Intensity   THRR 40-80% of Max Heartrate 109-132    Ratings of Perceived Exertion 11-13    Perceived Dyspnea 0-4      Resistance Training   Training Prescription Yes    Weight 3 lb    Reps 10-15           Exercise Goals: Frequency: Be able to perform aerobic exercise two to three times per week in program working toward 2-5 days per week of home exercise.  Intensity: Work with a perceived exertion of 11 (fairly light) - 15 (hard) while following your exercise prescription.  We will make changes to your prescription with you as you progress through the program.   Duration: Be able to do 30 to 45 minutes of continuous aerobic exercise in addition to a 5 minute warm-up and a 5 minute cool-down routine.   Nutrition Goals: Your personal nutrition goals will be established when you do your nutrition analysis with the  dietician.  The following are general nutrition guidelines to follow: Cholesterol < 200mg /day Sodium < 1500mg /day Fiber: Men over 50 yrs - 30 grams per day  Personal Goals:  Personal Goals and Risk Factors at Admission - 05/28/20 1353      Core Components/Risk Factors/Patient Goals on Admission   Intervention Weight Management: Develop a combined nutrition and exercise program designed to reach desired caloric intake, while maintaining appropriate intake of nutrient and fiber, sodium and fats, and appropriate energy expenditure required for the weight goal.;Weight Management/Obesity: Establish reasonable short term and long term weight goals.;Obesity: Provide education and appropriate resources to help participant work on and attain dietary goals.    Admit Weight 186 lb (84.4 kg)    Goal Weight: Short Term 184 lb (83.5 kg)    Goal Weight: Long Term 180 lb (81.6 kg)    Expected Outcomes Short Term: Continue to assess and modify interventions until short term weight is achieved;Long Term: Adherence to nutrition and physical activity/exercise program aimed toward attainment of  established weight goal;Weight Loss: Understanding of general recommendations for a balanced deficit meal plan, which promotes 1-2 lb weight loss per week and includes a negative energy balance of [phone] kcal/d    Diabetes Yes    Intervention Provide education about signs/symptoms and action to take for hypo/hyperglycemia.;Provide education about proper nutrition, including hydration, and aerobic/resistive exercise prescription along with prescribed medications to achieve blood glucose in normal ranges: Fasting glucose 65-99 mg/dL    Expected Outcomes Short Term: Participant verbalizes understanding of the signs/symptoms and immediate care of hyper/hypoglycemia, proper foot care and importance of medication, aerobic/resistive exercise and nutrition plan for blood glucose control.;Long Term: Attainment of HbA1C < 7%.     Hypertension Yes    Intervention Provide education on lifestyle modifcations including regular physical activity/exercise, weight management, moderate sodium restriction and increased consumption of fresh fruit, vegetables, and low fat dairy, alcohol moderation, and smoking cessation.;Monitor prescription use compliance.    Expected Outcomes Short Term: Continued assessment and intervention until BP is < 140/97mm HG in hypertensive participants. < 130/23mm HG in hypertensive participants with diabetes, heart failure or chronic kidney disease.;Long Term: Maintenance of blood pressure at goal levels.    Lipids Yes    Intervention Provide education and support for participant on nutrition & aerobic/resistive exercise along with prescribed medications to achieve LDL 70mg , HDL >40mg .    Expected Outcomes Short Term: Participant states understanding of desired cholesterol values and is compliant with medications prescribed. Participant is following exercise prescription and nutrition guidelines.;Long Term: Cholesterol controlled with medications as prescribed, with individualized exercise RX and with personalized nutrition plan. Value goals: LDL < 70mg , HDL > 40 mg.           Tobacco Use Initial Evaluation: Social History   Tobacco Use  Smoking Status Former Smoker   Packs/day: 1.00   Years: 15.00   Pack years: 15.00   Types: Cigarettes  Smokeless Tobacco Never Used  Tobacco Comment   quit 1977    Exercise Goals and Review:  Exercise Goals    Row Name 06/08/20 1630             Exercise Goals   Increase Physical Activity Yes       Intervention Provide advice, education, support and counseling about physical activity/exercise needs.;Develop an individualized exercise prescription for aerobic and resistive training based on initial evaluation findings, risk stratification, comorbidities and participant's personal goals.       Expected Outcomes Short Term: Attend rehab on a regular  basis to increase amount of physical activity.;Long Term: Add in home exercise to make exercise part of routine and to increase amount of physical activity.;Long Term: Exercising regularly at least 3-5 days a week.       Increase Strength and Stamina Yes       Intervention Provide advice, education, support and counseling about physical activity/exercise needs.;Develop an individualized exercise prescription for aerobic and resistive training based on initial evaluation findings, risk stratification, comorbidities and participant's personal goals.       Expected Outcomes Short Term: Increase workloads from initial exercise prescription for resistance, speed, and METs.;Short Term: Perform resistance training exercises routinely during rehab and add in resistance training at home;Long Term: Improve cardiorespiratory fitness, muscular endurance and strength as measured by increased METs and functional capacity (6MWT)       Able to understand and use rate of perceived exertion (RPE) scale Yes       Intervention Provide education and explanation on how to use RPE scale  Expected Outcomes Short Term: Able to use RPE daily in rehab to express subjective intensity level;Long Term:  Able to use RPE to guide intensity level when exercising independently       Able to understand and use Dyspnea scale Yes       Intervention Provide education and explanation on how to use Dyspnea scale       Expected Outcomes Short Term: Able to use Dyspnea scale daily in rehab to express subjective sense of shortness of breath during exertion;Long Term: Able to use Dyspnea scale to guide intensity level when exercising independently       Knowledge and understanding of Target Heart Rate Range (THRR) Yes       Intervention Provide education and explanation of THRR including how the numbers were predicted and where they are located for reference       Expected Outcomes Short Term: Able to state/look up THRR;Short Term: Able to  use daily as guideline for intensity in rehab;Long Term: Able to use THRR to govern intensity when exercising independently       Able to check pulse independently Yes       Intervention Provide education and demonstration on how to check pulse in carotid and radial arteries.;Review the importance of being able to check your own pulse for safety during independent exercise       Expected Outcomes Short Term: Able to explain why pulse checking is important during independent exercise;Long Term: Able to check pulse independently and accurately       Understanding of Exercise Prescription Yes       Intervention Provide education, explanation, and written materials on patient's individual exercise prescription       Expected Outcomes Short Term: Able to explain program exercise prescription;Long Term: Able to explain home exercise prescription to exercise independently              Copy of goals given to participant. Patient Instructions  Patient Details  Name: AERON DONAGHEY MRN: [id number] Date of Birth: Jun 28, 1941 Referring Provider:  [name], MD  Below are your personal goals for exercise, nutrition, and risk factors. Our goal is to help you stay on track towards obtaining and maintaining these goals. We will be discussing your progress on these goals with you throughout the program.  Initial Exercise Prescription:  Initial Exercise Prescription - 06/08/20 1600      Date of Initial Exercise RX and Referring Provider   Date 06/08/20    Referring Provider [name]      Treadmill   MPH 2    Grade 0    Minutes 15    METs 2.5      Recumbant Bike   Level 1    RPM 60    Minutes 15    METs 2.5      NuStep   Level 1    SPM 80    Minutes 15    METs 2.5      Recumbant Elliptical   Level 1    RPM 50    Minutes 15    METs 2.5      REL-XR   Level 1    Speed 50    Minutes 15    METs 2.5      Prescription Details   Frequency (times per week) 3    Duration Progress to  30 minutes of continuous aerobic without signs/symptoms of physical distress      Intensity   THRR 40-80% of Max  Heartrate 109-132    Ratings of Perceived Exertion 11-13    Perceived Dyspnea 0-4      Resistance Training   Training Prescription Yes    Weight 3 lb    Reps 10-15           Exercise Goals: Frequency: Be able to perform aerobic exercise two to three times per week in program working toward 2-5 days per week of home exercise.  Intensity: Work with a perceived exertion of 11 (fairly light) - 15 (hard) while following your exercise prescription.  We will make changes to your prescription with you as you progress through the program.   Duration: Be able to do 30 to 45 minutes of continuous aerobic exercise in addition to a 5 minute warm-up and a 5 minute cool-down routine.   Nutrition Goals: Your personal nutrition goals will be established when you do your nutrition analysis with the dietician.  The following are general nutrition guidelines to follow: Cholesterol < 200mg /day Sodium < 1500mg /day Fiber: Men over 50 yrs - 30 grams per day  Personal Goals:  Personal Goals and Risk Factors at Admission - 06/08/20 1649      Core Components/Risk Factors/Patient Goals on Admission    Weight Management Yes;Obesity;Weight Loss    Intervention Weight Management: Develop a combined nutrition and exercise program designed to reach desired caloric intake, while maintaining appropriate intake of nutrient and fiber, sodium and fats, and appropriate energy expenditure required for the weight goal.;Weight Management/Obesity: Establish reasonable short term and long term weight goals.;Obesity: Provide education and appropriate resources to help participant work on and attain dietary goals.    Admit Weight 176 lb 3.2 oz (79.9 kg)    Goal Weight: Short Term 170 lb (77.1 kg)    Goal Weight: Long Term 165 lb (74.8 kg)    Expected Outcomes Short Term: Continue to assess and modify  interventions until short term weight is achieved;Long Term: Adherence to nutrition and physical activity/exercise program aimed toward attainment of established weight goal;Weight Loss: Understanding of general recommendations for a balanced deficit meal plan, which promotes 1-2 lb weight loss per week and includes a negative energy balance of [phone] kcal/d    Diabetes Yes    Intervention Provide education about signs/symptoms and action to take for hypo/hyperglycemia.;Provide education about proper nutrition, including hydration, and aerobic/resistive exercise prescription along with prescribed medications to achieve blood glucose in normal ranges: Fasting glucose 65-99 mg/dL    Hypertension Yes    Intervention Provide education on lifestyle modifcations including regular physical activity/exercise, weight management, moderate sodium restriction and increased consumption of fresh fruit, vegetables, and low fat dairy, alcohol moderation, and smoking cessation.;Monitor prescription use compliance.    Expected Outcomes Short Term: Continued assessment and intervention until BP is < 140/54mm HG in hypertensive participants. < 130/77mm HG in hypertensive participants with diabetes, heart failure or chronic kidney disease.;Long Term: Maintenance of blood pressure at goal levels.    Lipids Yes    Intervention Provide education and support for participant on nutrition & aerobic/resistive exercise along with prescribed medications to achieve LDL 70mg , HDL >40mg .    Expected Outcomes Short Term: Participant states understanding of desired cholesterol values and is compliant with medications prescribed. Participant is following exercise prescription and nutrition guidelines.;Long Term: Cholesterol controlled with medications as prescribed, with individualized exercise RX and with personalized nutrition plan. Value goals: LDL < 70mg , HDL > 40 mg.           Tobacco Use Initial Evaluation: Social  History    Tobacco Use  Smoking Status Former Smoker   Packs/day: 1.00   Years: 15.00   Pack years: 15.00   Types: Cigarettes  Smokeless Tobacco Never Used  Tobacco Comment   quit 1977    Exercise Goals and Review:  Exercise Goals    Row Name 06/08/20 1630             Exercise Goals   Increase Physical Activity Yes       Intervention Provide advice, education, support and counseling about physical activity/exercise needs.;Develop an individualized exercise prescription for aerobic and resistive training based on initial evaluation findings, risk stratification, comorbidities and participant's personal goals.       Expected Outcomes Short Term: Attend rehab on a regular basis to increase amount of physical activity.;Long Term: Add in home exercise to make exercise part of routine and to increase amount of physical activity.;Long Term: Exercising regularly at least 3-5 days a week.       Increase Strength and Stamina Yes       Intervention Provide advice, education, support and counseling about physical activity/exercise needs.;Develop an individualized exercise prescription for aerobic and resistive training based on initial evaluation findings, risk stratification, comorbidities and participant's personal goals.       Expected Outcomes Short Term: Increase workloads from initial exercise prescription for resistance, speed, and METs.;Short Term: Perform resistance training exercises routinely during rehab and add in resistance training at home;Long Term: Improve cardiorespiratory fitness, muscular endurance and strength as measured by increased METs and functional capacity (6MWT)       Able to understand and use rate of perceived exertion (RPE) scale Yes       Intervention Provide education and explanation on how to use RPE scale       Expected Outcomes Short Term: Able to use RPE daily in rehab to express subjective intensity level;Long Term:  Able to use RPE to guide intensity level when  exercising independently       Able to understand and use Dyspnea scale Yes       Intervention Provide education and explanation on how to use Dyspnea scale       Expected Outcomes Short Term: Able to use Dyspnea scale daily in rehab to express subjective sense of shortness of breath during exertion;Long Term: Able to use Dyspnea scale to guide intensity level when exercising independently       Knowledge and understanding of Target Heart Rate Range (THRR) Yes       Intervention Provide education and explanation of THRR including how the numbers were predicted and where they are located for reference       Expected Outcomes Short Term: Able to state/look up THRR;Short Term: Able to use daily as guideline for intensity in rehab;Long Term: Able to use THRR to govern intensity when exercising independently       Able to check pulse independently Yes       Intervention Provide education and demonstration on how to check pulse in carotid and radial arteries.;Review the importance of being able to check your own pulse for safety during independent exercise       Expected Outcomes Short Term: Able to explain why pulse checking is important during independent exercise;Long Term: Able to check pulse independently and accurately       Understanding of Exercise Prescription Yes       Intervention Provide education, explanation, and written materials on patient's individual exercise prescription  Expected Outcomes Short Term: Able to explain program exercise prescription;Long Term: Able to explain home exercise prescription to exercise independently              Copy of goals given to participant.

## 2020-06-10 ENCOUNTER — Encounter: Payer: Self-pay | Admitting: *Deleted

## 2020-06-10 DIAGNOSIS — I208 Other forms of angina pectoris: Secondary | ICD-10-CM

## 2020-06-10 NOTE — Progress Notes (Signed)
Cardiac Individual Treatment Plan  Patient Details  Name: Brett Blake MRN: [id number] Date of Birth: [birth year] Referring Provider:     Cardiac Rehab from 06/08/2020 in [facility] Cardiac and Pulmonary Rehab  Referring Provider [name]      Initial Encounter Date:    Cardiac Rehab from 06/08/2020 in [facility] Cardiac and Pulmonary Rehab  Date 06/08/20      Visit Diagnosis: Chronic stable angina ([place])  Patient's Home Medications on Admission:  Current Outpatient Medications:  .  albuterol (ACCUNEB) 1.25 MG/3ML nebulizer solution, TAKE 3 MLS (1.25 MG TOTAL) BY NEBULIZATION EVERY 6 (SIX) HOURS AS NEEDED FOR WHEEZING, Disp: , Rfl:  .  ALPRAZolam (XANAX) 0.5 MG tablet, Take 0.5 tablets by mouth daily as needed., Disp: , Rfl:  .  amLODipine (NORVASC) 5 MG tablet, Take 5 mg by mouth daily., Disp: , Rfl: 3 .  ascorbic acid (VITAMIN C) 1000 MG tablet, Take 1,000 mg by mouth daily., Disp: , Rfl:  .  aspirin 81 MG chewable tablet, Chew 81 mg by mouth daily., Disp: , Rfl:  .  carvedilol (COREG) 12.5 MG tablet, Take 12.5 mg by mouth 2 (two) times a day. , Disp: , Rfl:  .  diltiazem (CARDIZEM CD) 180 MG 24 hr capsule, Take 1 capsule by mouth 2 (two) times a day., Disp: , Rfl:  .  donepezil (ARICEPT) 5 MG tablet, Take by mouth., Disp: , Rfl:  .  esomeprazole (NEXIUM) 40 MG capsule, Take 40 mg by mouth daily at 12 noon., Disp: , Rfl:  .  fluticasone (FLONASE) 50 MCG/ACT nasal spray, Place 1 spray into both nostrils daily as needed for allergies or rhinitis., Disp: , Rfl:  .  glimepiride (AMARYL) 1 MG tablet, Take 1 mg by mouth daily. , Disp: , Rfl:  .  glucosamine-chondroitin 500-400 MG tablet, Take 1 tablet by mouth daily., Disp: , Rfl:  .  ibuprofen (ADVIL,MOTRIN) 200 MG tablet, Take 200 mg by mouth every 6 (six) hours as needed., Disp: , Rfl:  .  metoprolol succinate (TOPROL-XL) 50 MG 24 hr tablet, Take 1 tablet by mouth 2 (two) times a day., Disp: , Rfl:  .  montelukast (SINGULAIR) 10 MG tablet, Take 10  mg by mouth at bedtime., Disp: , Rfl:  .  Multiple Vitamins-Minerals (MULTIVITAMIN PO), Take 1 tablet by mouth daily., Disp: , Rfl:  .  nitroGLYCERIN (NITROSTAT) 0.4 MG SL tablet, Place 0.4 mg under the tongue every 5 (five) minutes as needed for chest pain., Disp: , Rfl:  .  PARoxetine (PAXIL) 20 MG tablet, Take 20 mg by mouth daily., Disp: , Rfl:  .  simvastatin (ZOCOR) 20 MG tablet, Take 20 mg by mouth daily., Disp: , Rfl:  .  triamterene-hydrochlorothiazide (DYAZIDE) 37.5-25 MG per capsule, Take 1 capsule by mouth daily., Disp: , Rfl:  .  zolpidem (AMBIEN) 5 MG tablet, Take 1 tablet by mouth at bedtime., Disp: , Rfl:   Past Medical History: Past Medical History:  Diagnosis Date  . Anxiety   . Arthritis   . Asthma   . Colon polyps   . COPD (chronic obstructive pulmonary disease) ([place])   . Depression   . Diabetes ([place])   . Difficult intubation   . GERD (gastroesophageal reflux disease)   . High cholesterol   . Hyperlipidemia   . Hypertension   . Sleep apnea     Tobacco Use: Social History   Tobacco Use  Smoking Status Former Smoker  . Packs/day: 1.00  . Years: 15.00  .  Pack years: 15.00  . Types: Cigarettes  Smokeless Tobacco Never Used  Tobacco Comment   quit 1977    Labs: Recent Review Flowsheet Data    Labs for ITP Cardiac and Pulmonary Rehab Latest Ref Rng & Units 06/26/2015 06/27/2015 05/08/2019   Hemoglobin A1c 4.8 - 5.6 % 5.9 6.1(H) 5.9(H)       Exercise Target Goals: Exercise Program Goal: Individual exercise prescription set using results from initial 6 min walk test and THRR while considering  patient's activity barriers and safety.   Exercise Prescription Goal: Initial exercise prescription builds to 30-45 minutes a day of aerobic activity, 2-3 days per week.  Home exercise guidelines will be given to patient during program as part of exercise prescription that the participant will acknowledge.   Education: Aerobic Exercise & Resistance Training: -  Gives group verbal and written instruction on the various components of exercise. Focuses on aerobic and resistive training programs and the benefits of this training and how to safely progress through these programs..   Cardiac Rehab from 07/17/2017 in [facility] Cardiac and Pulmonary Rehab  Date 07/05/17  Educator AS  Instruction Review Code (retired) 2- meets goals/outcomes      Education: Exercise & Equipment Safety: - Individual verbal instruction and demonstration of equipment use and safety with use of the equipment.   Cardiac Rehab from 06/08/2020 in [facility] Cardiac and Pulmonary Rehab  Date 06/08/20  Educator AS  Instruction Review Code 1- Verbalizes Understanding      Education: Exercise Physiology & General Exercise Guidelines: - Group verbal and written instruction with models to review the exercise physiology of the cardiovascular system and associated critical values. Provides general exercise guidelines with specific guidelines to those with heart or lung disease.    Cardiac Rehab from 06/08/2020 in [facility] Cardiac and Pulmonary Rehab  Date 06/08/20  Educator AS  Instruction Review Code 1- Verbalizes Understanding      Education: Flexibility, Balance, Mind/Body Relaxation: Provides group verbal/written instruction on the benefits of flexibility and balance training, including mind/body exercise modes such as yoga, pilates and tai chi.  Demonstration and skill practice provided.   Cardiac Rehab from 07/17/2017 in [facility] Cardiac and Pulmonary Rehab  Date 07/10/17  Educator AS  Instruction Review Code (retired) 2- meets goals/outcomes      Activity Barriers & Risk Stratification:  Activity Barriers & Cardiac Risk Stratification - 05/28/20 1346      Activity Barriers & Cardiac Risk Stratification   Activity Barriers Chest Pain/Angina;Deconditioning    Cardiac Risk Stratification High           6 Minute Walk:  6 Minute Walk    Row Name 06/08/20 1624         6 Minute Walk     Phase Initial     Distance 1300 feet     Walk Time 6 minutes     # of Rest Breaks 0     MPH 2.46     METS 2.26     RPE 17     Perceived Dyspnea  2     VO2 Peak 7.9     Symptoms Yes (comment)     Comments L side - has asthma - ha sspoken to Dr [name] - has treatment about 1x per year     Resting HR 87 bpm     Resting BP 112/64     Resting Oxygen Saturation  96 %     Exercise Oxygen Saturation  during 6 min walk 96 %  Max Ex. HR 101 bpm     Max Ex. BP 112/66     2 Minute Post BP 112/68            Oxygen Initial Assessment:   Oxygen Re-Evaluation:   Oxygen Discharge (Final Oxygen Re-Evaluation):   Initial Exercise Prescription:  Initial Exercise Prescription - 06/08/20 1600      Date of Initial Exercise RX and Referring Provider   Date 06/08/20    Referring Provider [name]      Treadmill   MPH 2    Grade 0    Minutes 15    METs 2.5      Recumbant Bike   Level 1    RPM 60    Minutes 15    METs 2.5      NuStep   Level 1    SPM 80    Minutes 15    METs 2.5      Recumbant Elliptical   Level 1    RPM 50    Minutes 15    METs 2.5      REL-XR   Level 1    Speed 50    Minutes 15    METs 2.5      Prescription Details   Frequency (times per week) 3    Duration Progress to 30 minutes of continuous aerobic without signs/symptoms of physical distress      Intensity   THRR 40-80% of Max Heartrate 109-132    Ratings of Perceived Exertion 11-13    Perceived Dyspnea 0-4      Resistance Training   Training Prescription Yes    Weight 3 lb    Reps 10-15           Perform Capillary Blood Glucose checks as needed.  Exercise Prescription Changes:  Exercise Prescription Changes    Row Name 06/08/20 1600             Response to Exercise   Blood Pressure (Admit) 112/64       Blood Pressure (Exercise) 112/66       Blood Pressure (Exit) 112/68       Heart Rate (Admit) 87 bpm       Heart Rate (Exercise) 101 bpm       Heart Rate (Exit) 89  bpm       Oxygen Saturation (Admit) 96 %       Oxygen Saturation (Exercise) 96 %       Rating of Perceived Exertion (Exercise) 17       Perceived Dyspnea (Exercise) 2       Symptoms L side due to asthman              Exercise Comments:   Exercise Goals and Review:  Exercise Goals    Row Name 06/08/20 1630             Exercise Goals   Increase Physical Activity Yes       Intervention Provide advice, education, support and counseling about physical activity/exercise needs.;Develop an individualized exercise prescription for aerobic and resistive training based on initial evaluation findings, risk stratification, comorbidities and participant's personal goals.       Expected Outcomes Short Term: Attend rehab on a regular basis to increase amount of physical activity.;Long Term: Add in home exercise to make exercise part of routine and to increase amount of physical activity.;Long Term: Exercising regularly at least 3-5 days a week.       Increase Strength and  Stamina Yes       Intervention Provide advice, education, support and counseling about physical activity/exercise needs.;Develop an individualized exercise prescription for aerobic and resistive training based on initial evaluation findings, risk stratification, comorbidities and participant's personal goals.       Expected Outcomes Short Term: Increase workloads from initial exercise prescription for resistance, speed, and METs.;Short Term: Perform resistance training exercises routinely during rehab and add in resistance training at home;Long Term: Improve cardiorespiratory fitness, muscular endurance and strength as measured by increased METs and functional capacity (6MWT)       Able to understand and use rate of perceived exertion (RPE) scale Yes       Intervention Provide education and explanation on how to use RPE scale       Expected Outcomes Short Term: Able to use RPE daily in rehab to express subjective intensity level;Long  Term:  Able to use RPE to guide intensity level when exercising independently       Able to understand and use Dyspnea scale Yes       Intervention Provide education and explanation on how to use Dyspnea scale       Expected Outcomes Short Term: Able to use Dyspnea scale daily in rehab to express subjective sense of shortness of breath during exertion;Long Term: Able to use Dyspnea scale to guide intensity level when exercising independently       Knowledge and understanding of Target Heart Rate Range (THRR) Yes       Intervention Provide education and explanation of THRR including how the numbers were predicted and where they are located for reference       Expected Outcomes Short Term: Able to state/look up THRR;Short Term: Able to use daily as guideline for intensity in rehab;Long Term: Able to use THRR to govern intensity when exercising independently       Able to check pulse independently Yes       Intervention Provide education and demonstration on how to check pulse in carotid and radial arteries.;Review the importance of being able to check your own pulse for safety during independent exercise       Expected Outcomes Short Term: Able to explain why pulse checking is important during independent exercise;Long Term: Able to check pulse independently and accurately       Understanding of Exercise Prescription Yes       Intervention Provide education, explanation, and written materials on patient's individual exercise prescription       Expected Outcomes Short Term: Able to explain program exercise prescription;Long Term: Able to explain home exercise prescription to exercise independently              Exercise Goals Re-Evaluation :   Discharge Exercise Prescription (Final Exercise Prescription Changes):  Exercise Prescription Changes - 06/08/20 1600      Response to Exercise   Blood Pressure (Admit) 112/64    Blood Pressure (Exercise) 112/66    Blood Pressure (Exit) 112/68    Heart  Rate (Admit) 87 bpm    Heart Rate (Exercise) 101 bpm    Heart Rate (Exit) 89 bpm    Oxygen Saturation (Admit) 96 %    Oxygen Saturation (Exercise) 96 %    Rating of Perceived Exertion (Exercise) 17    Perceived Dyspnea (Exercise) 2    Symptoms L side due to asthman           Nutrition:  Target Goals: Understanding of nutrition guidelines, daily intake of sodium <1529m, cholesterol <2029m calories  30% from fat and 7% or less from saturated fats, daily to have 5 or more servings of fruits and vegetables.  Education: Controlling Sodium/Reading Food Labels -Group verbal and written material supporting the discussion of sodium use in heart healthy nutrition. Review and explanation with models, verbal and written materials for utilization of the food label.   Cardiac Rehab from 07/17/2017 in [facility] Cardiac and Pulmonary Rehab  Date 06/26/17  Educator PI  Instruction Review Code (retired) 2- meets goals/outcomes      Education: General Nutrition Guidelines/Fats and Fiber: -Group instruction provided by verbal, written material, models and posters to present the general guidelines for heart healthy nutrition. Gives an explanation and review of dietary fats and fiber.   Cardiac Rehab from 07/17/2017 in [facility] Cardiac and Pulmonary Rehab  Date 06/19/17  Educator PI  Instruction Review Code (retired) 2- meets goals/outcomes      Biometrics:  Pre Biometrics - 06/08/20 1631      Pre Biometrics   Height 5' 5.25" (1.657 m)    Weight 176 lb 3.2 oz (79.9 kg)    BMI (Calculated) 29.11    Single Leg Stand 3.87 seconds            Nutrition Therapy Plan and Nutrition Goals:   Nutrition Assessments:  Nutrition Assessments - 06/08/20 1636      MEDFICTS Scores   Pre Score 60           MEDIFICTS Score Key:          ?70 Need to make dietary changes          40-70 Heart Healthy Diet         ? 40 Therapeutic Level Cholesterol Diet  Nutrition Goals Re-Evaluation:   Nutrition Goals  Discharge (Final Nutrition Goals Re-Evaluation):   Psychosocial: Target Goals: Acknowledge presence or absence of significant depression and/or stress, maximize coping skills, provide positive support system. Participant is able to verbalize types and ability to use techniques and skills needed for reducing stress and depression.   Education: Depression - Provides group verbal and written instruction on the correlation between heart/lung disease and depressed mood, treatment options, and the stigmas associated with seeking treatment.   Cardiac Rehab from 07/17/2017 in [facility] Cardiac and Pulmonary Rehab  Date 06/21/17  Educator Lucianne Lei, MSW  Instruction Review Code (retired) 2- meets goals/outcomes      Education: Sleep Hygiene -Provides group verbal and written instruction about how sleep can affect your health.  Define sleep hygiene, discuss sleep cycles and impact of sleep habits. Review good sleep hygiene tips.     Education: Stress and Anxiety: - Provides group verbal and written instruction about the health risks of elevated stress and causes of high stress.  Discuss the correlation between heart/lung disease and anxiety and treatment options. Review healthy ways to manage with stress and anxiety.    Initial Review & Psychosocial Screening:  Initial Psych Review & Screening - 05/28/20 1348      Initial Review   Current issues with Current Stress Concerns    Comments anxiety about what is happening to our country. Concerned his memory is not good. Has med to help      Family Dynamics   Good Support System? Yes   wife, daughter,granddaughter lives at home with [place], church family, neighbors     Barriers   Psychosocial barriers to participate in program There are no identifiable barriers or psychosocial needs.;The patient should benefit from training in stress management and relaxation.  Screening Interventions   Interventions Encouraged to exercise;To provide  support and resources with identified psychosocial needs;Provide feedback about the scores to participant    Expected Outcomes Short Term goal: Utilizing psychosocial counselor, staff and physician to assist with identification of specific Stressors or current issues interfering with healing process. Setting desired goal for each stressor or current issue identified.;Long Term Goal: Stressors or current issues are controlled or eliminated.;Short Term goal: Identification and review with participant of any Quality of Life or Depression concerns found by scoring the questionnaire.;Long Term goal: The participant improves quality of Life and PHQ9 Scores as seen by post scores and/or verbalization of changes           Quality of Life Scores:   Scores of 19 and below usually indicate a poorer quality of life in these areas.  A difference of  2-3 points is a clinically meaningful difference.  A difference of 2-3 points in the total score of the Quality of Life Index has been associated with significant improvement in overall quality of life, self-image, physical symptoms, and general health in studies assessing change in quality of life.  PHQ-9: Recent Review Flowsheet Data    Depression screen [facility] 2/9 06/08/2020 05/08/2017   Decreased Interest 1 1   Down, Depressed, Hopeless 1 0   PHQ - 2 Score 2 1   Altered sleeping 0 2    Tired, decreased energy 1 2   Change in appetite 1 3    Feeling bad or failure about yourself  1 0   Trouble concentrating 1 1   Moving slowly or fidgety/restless 0 0   Suicidal thoughts 0 0   PHQ-9 Score 6 9   Difficult doing work/chores Somewhat difficult Somewhat difficult      Interpretation of Total Score  Total Score Depression Severity:  1-4 = Minimal depression, 5-9 = Mild depression, 10-14 = Moderate depression, 15-19 = Moderately severe depression, 20-27 = Severe depression   Psychosocial Evaluation and Intervention:  Psychosocial Evaluation - 05/28/20 1359       Psychosocial Evaluation & Interventions   Interventions Encouraged to exercise with the program and follow exercise prescription;Stress management education    Comments Shanon Brow ahs not barriers to attending the program. He is glad to have a referral so he can return to his exercise regimen to help with his weight loss and to keep his angina symptoms under control.  DAvid does have some anxiety at times. He expressed concern about our country and what is happening in the country. 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 DAvid lives in his home with his wife, their saughter and a granddaughter. His granddaughter is a second year Presenter, broadcasting, so she is home on weekends and school breaks.  Shanon Brow will do well in the program. He remembers how much it helped him when he was here in 2018 and is looking forward to the same results this time.    Expected Outcomes STG; Shanon Brow will be consistent in attendance and progress well with his exercise. LTG: Shanon Brow will be able to maintain his lifestyle chages after discharge for the program    Continue Psychosocial Services  Follow up required by staff           Psychosocial Re-Evaluation:   Psychosocial Discharge (Final Psychosocial Re-Evaluation):   Vocational Rehabilitation: Provide vocational rehab assistance to qualifying candidates.   Vocational Rehab Evaluation & Intervention:  Vocational  Rehab - 05/28/20 1352      Initial Vocational Rehab Evaluation & Intervention   Assessment shows need for Vocational Rehabilitation No           Education: Education Goals: Education classes will be provided on a variety of topics geared toward better understanding of heart health and risk factor modification. Participant will state understanding/return demonstration of topics presented as noted by education test scores.  Learning Barriers/Preferences:  Learning Barriers/Preferences -  05/28/20 1352      Learning Barriers/Preferences   Learning Barriers Hearing    Learning Preferences Individual Instruction           General Cardiac Education Topics:  AED/CPR: - Group verbal and written instruction with the use of models to demonstrate the basic use of the AED with the basic ABC's of resuscitation.   Anatomy & Physiology of the Heart: - Group verbal and written instruction and models provide basic cardiac anatomy and physiology, with the coronary electrical and arterial systems. Review of Valvular disease and Heart Failure   Cardiac Rehab from 07/17/2017 in [facility] Cardiac and Pulmonary Rehab  Date 07/17/17  Educator [facility]  Instruction Review Code (retired) 2- meets goals/outcomes      Cardiac Procedures: - Group verbal and written instruction to review commonly prescribed medications for heart disease. Reviews the medication, class of the drug, and side effects. Includes the steps to properly store meds and maintain the prescription regimen. (beta blockers and nitrates)   Cardiac Rehab from 07/17/2017 in [facility] Cardiac and Pulmonary Rehab  Date 05/29/17  Educator SB  Instruction Review Code (retired) 2- meets goals/outcomes      Cardiac Medications I: - Group verbal and written instruction to review commonly prescribed medications for heart disease. Reviews the medication, class of the drug, and side effects. Includes the steps to properly store meds and maintain the prescription regimen.   Cardiac Rehab from 07/17/2017 in [facility] Cardiac and Pulmonary Rehab  Date 06/05/17  Marisue Humble 2]  Educator SB  Instruction Review Code (retired) 2- meets goals/outcomes      Cardiac Medications II: -Group verbal and written instruction to review commonly prescribed medications for heart disease. Reviews the medication, class of the drug, and side effects. (all other drug classes)    Go Sex-Intimacy & Heart Disease, Get SMART - Goal Setting: - Group verbal and written instruction  through game format to discuss heart disease and the return to sexual intimacy. Provides group verbal and written material to discuss and apply goal setting through the application of the S.M.A.R.T. Method.   Cardiac Rehab from 07/17/2017 in [facility] Cardiac and Pulmonary Rehab  Date 05/29/17  Educator SB  Instruction Review Code (retired) 2- meets goals/outcomes      Other Matters of the Heart: - Provides group verbal, written materials and models to describe Stable Angina and Peripheral Artery. Includes description of the disease process and treatment options available to the cardiac patient.   Cardiac Rehab from 07/17/2017 in [facility] Cardiac and Pulmonary Rehab  Date 07/17/17  Educator [facility]  Instruction Review Code (retired) 2- meets goals/outcomes      Infection Prevention: - Provides verbal and written material to individual with discussion of infection control including proper hand washing and proper equipment cleaning during exercise session.   Cardiac Rehab from 06/08/2020 in [facility] Cardiac and Pulmonary Rehab  Date 06/08/20  Educator AS  Instruction Review Code 1- Verbalizes Understanding      Falls Prevention: - Provides verbal and written material to individual with  discussion of falls prevention and safety.   Cardiac Rehab from 07/17/2017 in [facility] Cardiac and Pulmonary Rehab  Date 05/08/17  Educator SB  Instruction Review Code (retired) 2- meets goals/outcomes      Other: -Provides group and verbal instruction on various topics (see comments)   Knowledge Questionnaire Score:  Knowledge Questionnaire Score - 06/08/20 1636      Knowledge Questionnaire Score   Pre Score 22/26           Core Components/Risk Factors/Patient Goals at Admission:  Personal Goals and Risk Factors at Admission - 06/08/20 1649      Core Components/Risk Factors/Patient Goals on Admission    Weight Management Yes;Obesity;Weight Loss    Intervention Weight Management: Develop a combined nutrition  and exercise program designed to reach desired caloric intake, while maintaining appropriate intake of nutrient and fiber, sodium and fats, and appropriate energy expenditure required for the weight goal.;Weight Management/Obesity: Establish reasonable short term and long term weight goals.;Obesity: Provide education and appropriate resources to help participant work on and attain dietary goals.    Admit Weight 176 lb 3.2 oz (79.9 kg)    Goal Weight: Short Term 170 lb (77.1 kg)    Goal Weight: Long Term 165 lb (74.8 kg)    Expected Outcomes Short Term: Continue to assess and modify interventions until short term weight is achieved;Long Term: Adherence to nutrition and physical activity/exercise program aimed toward attainment of established weight goal;Weight Loss: Understanding of general recommendations for a balanced deficit meal plan, which promotes 1-2 lb weight loss per week and includes a negative energy balance of [id number] kcal/d    Diabetes Yes    Intervention Provide education about signs/symptoms and action to take for hypo/hyperglycemia.;Provide education about proper nutrition, including hydration, and aerobic/resistive exercise prescription along with prescribed medications to achieve blood glucose in normal ranges: Fasting glucose 65-99 mg/dL    Hypertension Yes    Intervention Provide education on lifestyle modifcations including regular physical activity/exercise, weight management, moderate sodium restriction and increased consumption of fresh fruit, vegetables, and low fat dairy, alcohol moderation, and smoking cessation.;Monitor prescription use compliance.    Expected Outcomes Short Term: Continued assessment and intervention until BP is < 140/12m HG in hypertensive participants. < 130/853mHG in hypertensive participants with diabetes, heart failure or chronic kidney disease.;Long Term: Maintenance of blood pressure at goal levels.    Lipids Yes    Intervention Provide education and  support for participant on nutrition & aerobic/resistive exercise along with prescribed medications to achieve LDL <7076mHDL >12m47m  Expected Outcomes Short Term: Participant states understanding of desired cholesterol values and is compliant with medications prescribed. Participant is following exercise prescription and nutrition guidelines.;Long Term: Cholesterol controlled with medications as prescribed, with individualized exercise RX and with personalized nutrition plan. Value goals: LDL < 70mg43mL > 40 mg.           Education:Diabetes - Individual verbal and written instruction to review signs/symptoms of diabetes, desired ranges of glucose level fasting, after meals and with exercise. Acknowledge that pre and post exercise glucose checks will be done for 3 sessions at entry of program.   [facility]  Date 05/08/17  Educator JH  [facility] Review Code (retired) 2- meets goals/outcomes      Education: Know Your Numbers and Risk Factors: -Group verbal and written instruction about important numbers in your health.  Discussion of what are risk factors and how they play  a role in the disease process.  Review of Cholesterol, Blood Pressure, Diabetes, and BMI and the role they play in your overall health.   Core Components/Risk Factors/Patient Goals Review:    Core Components/Risk Factors/Patient Goals at Discharge (Final Review):    ITP Comments:  ITP Comments    Row Name 05/28/20 1425 06/08/20 1631 06/10/20 0556       ITP Comments Virtual Orientation completed today. DAvis has appt on 6/8 at 930 AM for EP eval and gym orientation. Documentation of diagnosis can be found in [facility]5/20/2021 office visit. Completed 6MWT and gym orientation.  Initial ITP created and sent for review to Dr. Emily Filbert, Medical Director. 30 Day review completed. Medical Director ITP review done, changes made as directed, and signed approval by Medical  Director.            Comments: 30 Day review completed. Medical Director ITP review done, changes made as directed, and signed approval by Medical Director.

## 2020-06-15 ENCOUNTER — Encounter: Payer: Medicare Other | Admitting: *Deleted

## 2020-06-15 ENCOUNTER — Other Ambulatory Visit: Payer: Self-pay

## 2020-06-15 DIAGNOSIS — I208 Other forms of angina pectoris: Secondary | ICD-10-CM

## 2020-06-15 LAB — GLUCOSE, CAPILLARY
Glucose-Capillary: 190 mg/dL — ABNORMAL HIGH (ref 70–99)
Glucose-Capillary: 208 mg/dL — ABNORMAL HIGH (ref 70–99)

## 2020-06-15 NOTE — Progress Notes (Signed)
Daily Session Note  Patient Details  Name: Brett Blake MRN: [id number] Date of Birth: 1941/07/01 Referring Provider:     Cardiac Rehab from 06/08/2020 in [facility] Cardiac and Pulmonary Rehab  Referring Provider [name]      Encounter Date: 06/15/2020  Check In:  Session Check In - 06/15/20 1532      Check-In   Supervising physician immediately available to respond to emergencies See telemetry face sheet for immediately available ER MD    Location [facility]-Cardiac & Pulmonary Rehab    Staff Present Justin Mend RCP,RRT,BSRT;Sandrea Boer Sherryll Burger, RN Moises Blood, BS, ACSM CEP, Exercise Physiologist;Amanda Oletta Darter, [place], ACSM CEP, Exercise Physiologist    Virtual Visit No    Medication changes reported     No    Fall or balance concerns reported    No    Warm-up and Cool-down Performed on first and last piece of equipment    Resistance Training Performed Yes    VAD Patient? No    PAD/SET Patient? No      Pain Assessment   Currently in Pain? No/denies              Social History   Tobacco Use  Smoking Status Former Smoker  . Packs/day: 1.00  . Years: 15.00  . Pack years: 15.00  . Types: Cigarettes  Smokeless Tobacco Never Used  Tobacco Comment   quit 1977    Goals Met:  Independence with exercise equipment Exercise tolerated well No report of cardiac concerns or symptoms Strength training completed today  Goals Unmet:  Not Applicable  Comments: First full day of exercise!  Patient was oriented to gym and equipment including functions, settings, policies, and procedures.  Patient's individual exercise prescription and treatment plan were reviewed.  All starting workloads were established based on the results of the 6 minute walk test done at initial orientation visit.  The plan for exercise progression was also introduced and progression will be customized based on patient's performance and goals.     Dr. Emily Filbert is Medical Director for [place] and [facility].

## 2020-06-17 ENCOUNTER — Other Ambulatory Visit: Payer: Self-pay

## 2020-06-17 ENCOUNTER — Encounter: Payer: Medicare Other | Admitting: *Deleted

## 2020-06-17 DIAGNOSIS — I208 Other forms of angina pectoris: Secondary | ICD-10-CM | POA: Diagnosis not present

## 2020-06-17 LAB — GLUCOSE, CAPILLARY
Glucose-Capillary: 174 mg/dL — ABNORMAL HIGH (ref 70–99)
Glucose-Capillary: 173 mg/dL — ABNORMAL HIGH (ref 70–99)

## 2020-06-17 NOTE — Progress Notes (Signed)
Daily Session Note  Patient Details  Name: Brett Blake MRN: [id number] Date of Birth: 1941-02-26 Referring Provider:     Cardiac Rehab from 06/08/2020 in [facility] Cardiac and Pulmonary Rehab  Referring Provider [name]      Encounter Date: 06/17/2020  Check In:  Session Check In - 06/17/20 1544      Check-In   Supervising physician immediately available to respond to emergencies See telemetry face sheet for immediately available ER MD    Location [facility] Re[facility]    Staff Present Renita Papa, RN BSN;Melissa Caiola RDN, Rowe Pavy, BA, ACSM CEP, Exercise Physiologist    Virtual Visit No    Medication changes reported     Yes    Comments started IMDUR    Fall or balance concerns reported    No    Warm-up and Cool-down Performed on first and last piece of equipment    Resistance Training Performed Yes    VAD Patient? No    PAD/SET Patient? No      Pain Assessment   Currently in Pain? No/denies              Social History   Tobacco Use  Smoking Status Former Smoker  . Packs/day: 1.00  . Years: 15.00  . Pack years: 15.00  . Types: Cigarettes  Smokeless Tobacco Never Used  Tobacco Comment   quit 1977    Goals Met:  Independence with exercise equipment Exercise tolerated well No report of cardiac concerns or symptoms Strength training completed today  Goals Unmet:  Not Applicable  Comments: Pt able to follow exercise prescription today without complaint.  Will continue to monitor for progression.    Dr. Emily Filbert is Medical Director for [place] and [facility].

## 2020-06-18 ENCOUNTER — Other Ambulatory Visit: Payer: Self-pay

## 2020-06-18 ENCOUNTER — Encounter: Payer: Medicare Other | Admitting: *Deleted

## 2020-06-18 DIAGNOSIS — I208 Other forms of angina pectoris: Secondary | ICD-10-CM | POA: Diagnosis not present

## 2020-06-18 LAB — GLUCOSE, CAPILLARY
Glucose-Capillary: 189 mg/dL — ABNORMAL HIGH (ref 70–99)
Glucose-Capillary: 151 mg/dL — ABNORMAL HIGH (ref 70–99)

## 2020-06-18 NOTE — Progress Notes (Signed)
Daily Session Note  Patient Details  Name: Brett Blake MRN: [id number] Date of Birth: [birth year] Referring Provider:     Cardiac Rehab from 06/08/2020 in [facility] Cardiac and Pulmonary Rehab  Referring Provider [name]      Encounter Date: 06/18/2020  Check In:  Session Check In - 06/18/20 1533      Check-In   Supervising physician immediately available to respond to emergencies See telemetry face sheet for immediately available ER MD    Location [facility] Re[facility]    Staff Present Renita Papa, RN BSN;Joseph [address] [place], [place], [place], CCRP, CCET    Virtual Visit No    Medication changes reported     No    Fall or balance concerns reported    No    Warm-up and Cool-down Performed on first and last piece of equipment    Resistance Training Performed Yes    VAD Patient? No    PAD/SET Patient? No      Pain Assessment   Currently in Pain? No/denies              Social History   Tobacco Use  Smoking Status Former Smoker  . Packs/day: 1.00  . Years: 15.00  . Pack years: 15.00  . Types: Cigarettes  Smokeless Tobacco Never Used  Tobacco Comment   quit 1977    Goals Met:  Independence with exercise equipment Exercise tolerated well No report of cardiac concerns or symptoms Strength training completed today  Goals Unmet:  Not Applicable  Comments: Pt able to follow exercise prescription today without complaint.  Will continue to monitor for progression.    Dr. Emily Filbert is Medical Director for [place] and [facility].

## 2020-06-22 ENCOUNTER — Encounter: Payer: Medicare Other | Admitting: *Deleted

## 2020-06-22 ENCOUNTER — Other Ambulatory Visit: Payer: Self-pay

## 2020-06-22 DIAGNOSIS — I208 Other forms of angina pectoris: Secondary | ICD-10-CM

## 2020-06-22 NOTE — Progress Notes (Signed)
Daily Session Note  Patient Details  Name: NICCO REAUME MRN: [id number] Date of Birth: [birth year] Referring Provider:     Cardiac Rehab from 06/08/2020 in [facility] Cardiac and Pulmonary Rehab  Referring Provider [name]      Encounter Date: 06/22/2020  Check In:  Session Check In - 06/22/20 1542      Check-In   Supervising physician immediately available to respond to emergencies See telemetry face sheet for immediately available ER MD    Location [facility] Re[facility]    Staff Present Renita Papa, RN BSN;Joseph [address] [place], [place], [place], CCRP, [place], [place], ACSM CEP, Exercise Physiologist    Virtual Visit No    Medication changes reported     No    Fall or balance concerns reported    No    Warm-up and Cool-down Performed on first and last piece of equipment    Resistance Training Performed Yes    VAD Patient? No    PAD/SET Patient? No      Pain Assessment   Currently in Pain? No/denies              Social History   Tobacco Use  Smoking Status Former Smoker  . Packs/day: 1.00  . Years: 15.00  . Pack years: 15.00  . Types: Cigarettes  Smokeless Tobacco Never Used  Tobacco Comment   quit 1977    Goals Met:  Independence with exercise equipment Exercise tolerated well No report of cardiac concerns or symptoms Strength training completed today  Goals Unmet:  Not Applicable  Comments: Pt able to follow exercise prescription today without complaint.  Will continue to monitor for progression.    Dr. Emily Filbert is Medical Director for [place] and [facility].

## 2020-06-24 ENCOUNTER — Other Ambulatory Visit: Payer: Self-pay

## 2020-06-24 ENCOUNTER — Encounter: Payer: Medicare Other | Admitting: *Deleted

## 2020-06-24 DIAGNOSIS — I208 Other forms of angina pectoris: Secondary | ICD-10-CM

## 2020-06-24 NOTE — Progress Notes (Signed)
Daily Session Note  Patient Details  Name: Brett Blake MRN: [id number] Date of Birth: 09-09-1941 Referring Provider:     Cardiac Rehab from 06/08/2020 in [facility] Cardiac and Pulmonary Rehab  Referring Provider [name]      Encounter Date: 06/24/2020  Check In:  Session Check In - 06/24/20 1547      Check-In   Supervising physician immediately available to respond to emergencies See telemetry face sheet for immediately available ER MD    Location [facility]    Staff Present Renita Papa, RN BSN;Laureen Owens Shark, BS, RRT, CPFT;Amanda Oletta Darter, BA, ACSM CEP, Exercise Physiologist    Virtual Visit No    Medication changes reported     No    Fall or balance concerns reported    No    Warm-up and Cool-down Performed on first and last piece of equipment    Resistance Training Performed Yes    VAD Patient? No    PAD/SET Patient? No      Pain Assessment   Currently in Pain? No/denies              Social History   Tobacco Use  Smoking Status Former Smoker  . Packs/day: 1.00  . Years: 15.00  . Pack years: 15.00  . Types: Cigarettes  Smokeless Tobacco Never Used  Tobacco Comment   quit 1977    Goals Met:  Independence with exercise equipment Exercise tolerated well No report of cardiac concerns or symptoms Strength training completed today  Goals Unmet:  Not Applicable  Comments: Pt able to follow exercise prescription today without complaint.  Will continue to monitor for progression.    Dr. Emily Filbert is Medical Director for [place] and [facility].

## 2020-06-25 ENCOUNTER — Encounter: Payer: Medicare Other | Attending: Internal Medicine | Admitting: *Deleted

## 2020-06-25 ENCOUNTER — Other Ambulatory Visit: Payer: Self-pay

## 2020-06-25 DIAGNOSIS — Z79899 Other long term (current) drug therapy: Secondary | ICD-10-CM | POA: Insufficient documentation

## 2020-06-25 DIAGNOSIS — K219 Gastro-esophageal reflux disease without esophagitis: Secondary | ICD-10-CM | POA: Insufficient documentation

## 2020-06-25 DIAGNOSIS — F419 Anxiety disorder, unspecified: Secondary | ICD-10-CM | POA: Insufficient documentation

## 2020-06-25 DIAGNOSIS — J449 Chronic obstructive pulmonary disease, unspecified: Secondary | ICD-10-CM | POA: Insufficient documentation

## 2020-06-25 DIAGNOSIS — I10 Essential (primary) hypertension: Secondary | ICD-10-CM | POA: Insufficient documentation

## 2020-06-25 DIAGNOSIS — E785 Hyperlipidemia, unspecified: Secondary | ICD-10-CM | POA: Diagnosis not present

## 2020-06-25 DIAGNOSIS — Z7984 Long term (current) use of oral hypoglycemic drugs: Secondary | ICD-10-CM | POA: Diagnosis not present

## 2020-06-25 DIAGNOSIS — Z7982 Long term (current) use of aspirin: Secondary | ICD-10-CM | POA: Insufficient documentation

## 2020-06-25 DIAGNOSIS — F329 Major depressive disorder, single episode, unspecified: Secondary | ICD-10-CM | POA: Diagnosis not present

## 2020-06-25 DIAGNOSIS — M1990 Unspecified osteoarthritis, unspecified site: Secondary | ICD-10-CM | POA: Diagnosis not present

## 2020-06-25 DIAGNOSIS — Z87891 Personal history of nicotine dependence: Secondary | ICD-10-CM | POA: Insufficient documentation

## 2020-06-25 DIAGNOSIS — E119 Type 2 diabetes mellitus without complications: Secondary | ICD-10-CM | POA: Diagnosis not present

## 2020-06-25 DIAGNOSIS — I208 Other forms of angina pectoris: Secondary | ICD-10-CM | POA: Diagnosis not present

## 2020-06-25 DIAGNOSIS — G4730 Sleep apnea, unspecified: Secondary | ICD-10-CM | POA: Insufficient documentation

## 2020-06-25 LAB — GLUCOSE, CAPILLARY: Glucose-Capillary: 165 mg/dL — ABNORMAL HIGH (ref 70–99)

## 2020-06-25 NOTE — Progress Notes (Signed)
Daily Session Note  Patient Details  Name: Brett Blake MRN: [id number] Date of Birth: 1941-05-09 Referring Provider:     Cardiac Rehab from 06/08/2020 in [facility] Cardiac and Pulmonary Rehab  Referring Provider [name]      Encounter Date: 06/25/2020  Check In:  Session Check In - 06/25/20 1539      Check-In   Supervising physician immediately available to respond to emergencies See telemetry face sheet for immediately available ER MD    Location [facility]    Staff Present Renita Papa, RN BSN;Joseph [address] [place], [place], [place], CCRP, CCET    Virtual Visit No    Medication changes reported     No    Fall or balance concerns reported    No    Warm-up and Cool-down Performed on first and last piece of equipment    Resistance Training Performed Yes    VAD Patient? No    PAD/SET Patient? No      Pain Assessment   Currently in Pain? No/denies              Social History   Tobacco Use  Smoking Status Former Smoker  . Packs/day: 1.00  . Years: 15.00  . Pack years: 15.00  . Types: Cigarettes  Smokeless Tobacco Never Used  Tobacco Comment   quit 1977    Goals Met:  Independence with exercise equipment Exercise tolerated well No report of cardiac concerns or symptoms Strength training completed today  Goals Unmet:  Not Applicable  Comments: Pt able to follow exercise prescription today without complaint.  Will continue to monitor for progression.    Dr. Emily Filbert is Medical Director for [place] and [facility].

## 2020-07-01 ENCOUNTER — Other Ambulatory Visit: Payer: Self-pay

## 2020-07-01 ENCOUNTER — Encounter: Payer: Medicare Other | Admitting: *Deleted

## 2020-07-01 DIAGNOSIS — I208 Other forms of angina pectoris: Secondary | ICD-10-CM | POA: Diagnosis not present

## 2020-07-01 NOTE — Progress Notes (Signed)
Daily Session Note  Patient Details  Name: Brett Blake MRN: [id number] Date of Birth: 1941/08/08 Referring Provider:     Cardiac Rehab from 06/08/2020 in [facility] Cardiac and Pulmonary Rehab  Referring Provider [name]      Encounter Date: 07/01/2020  Check In:  Session Check In - 07/01/20 1600      Check-In   Supervising physician immediately available to respond to emergencies See telemetry face sheet for immediately available ER MD    Location [facility] Re[facility]    Staff Present Renita Papa, RN BSN;Joseph Lou [place], [place] Exercise Physiologist    Virtual Visit No    Medication changes reported     No    Fall or balance concerns reported    No    Warm-up and Cool-down Performed on first and last piece of equipment    Resistance Training Performed Yes    VAD Patient? No    PAD/SET Patient? No      Pain Assessment   Currently in Pain? No/denies              Social History   Tobacco Use  Smoking Status Former Smoker  . Packs/day: 1.00  . Years: 15.00  . Pack years: 15.00  . Types: Cigarettes  Smokeless Tobacco Never Used  Tobacco Comment   quit 1977    Goals Met:  Independence with exercise equipment Exercise tolerated well No report of cardiac concerns or symptoms Strength training completed today  Goals Unmet:  Not Applicable  Comments: Pt able to follow exercise prescription today without complaint.  Will continue to monitor for progression.    Dr. Emily Filbert is Medical Director for [place] and [facility].

## 2020-07-02 ENCOUNTER — Other Ambulatory Visit: Payer: Self-pay

## 2020-07-02 ENCOUNTER — Encounter: Payer: Medicare Other | Admitting: *Deleted

## 2020-07-02 DIAGNOSIS — I208 Other forms of angina pectoris: Secondary | ICD-10-CM

## 2020-07-02 NOTE — Progress Notes (Signed)
Daily Session Note  Patient Details  Name: Brett Blake MRN: [id number] Date of Birth: 05-19-1941 Referring Provider:     Cardiac Rehab from 06/08/2020 in [facility] Cardiac and Pulmonary Rehab  Referring Provider [name]      Encounter Date: 07/02/2020  Check In:  Session Check In - 07/02/20 1600      Check-In   Supervising physician immediately available to respond to emergencies See telemetry face sheet for immediately available ER MD    Location [facility] Re[facility]    Staff Present Renita Papa, RN BSN;Joseph [address] [place], [place], ACSM CEP, Exercise Physiologist;Jessica Kimberly, [place], RCEP, CCRP, CCET    Virtual Visit No    Medication changes reported     No    Fall or balance concerns reported    No    Warm-up and Cool-down Performed on first and last piece of equipment    Resistance Training Performed Yes    VAD Patient? No    PAD/SET Patient? No      Pain Assessment   Currently in Pain? No/denies              Social History   Tobacco Use  Smoking Status Former Smoker  . Packs/day: 1.00  . Years: 15.00  . Pack years: 15.00  . Types: Cigarettes  Smokeless Tobacco Never Used  Tobacco Comment   quit 1977    Goals Met:  Independence with exercise equipment Exercise tolerated well No report of cardiac concerns or symptoms Strength training completed today  Goals Unmet:  Not Applicable  Comments: Pt able to follow exercise prescription today without complaint.  Will continue to monitor for progression.    Dr. Emily Filbert is Medical Director for [place] and [facility].

## 2020-07-06 ENCOUNTER — Encounter: Payer: Medicare Other | Admitting: *Deleted

## 2020-07-06 ENCOUNTER — Other Ambulatory Visit: Payer: Self-pay

## 2020-07-06 DIAGNOSIS — I208 Other forms of angina pectoris: Secondary | ICD-10-CM

## 2020-07-06 NOTE — Progress Notes (Signed)
Daily Session Note  Patient Details  Name: Brett Blake MRN: [id number] Date of Birth: [birth year] Referring Provider:     Cardiac Rehab from 06/08/2020 in [facility] Cardiac and Pulmonary Rehab  Referring Provider [name]      Encounter Date: 07/06/2020  Check In:  Session Check In - 07/06/20 1609      Check-In   Supervising physician immediately available to respond to emergencies See telemetry face sheet for immediately available ER MD    Location [facility]    Staff Present Renita Papa, RN Margurite Auerbach, MS Exercise Physiologist;Kelly Amedeo Plenty, BS, ACSM CEP, Exercise Physiologist    Virtual Visit No    Medication changes reported     No    Fall or balance concerns reported    No    Warm-up and Cool-down Performed on first and last piece of equipment    Resistance Training Performed Yes    VAD Patient? No    PAD/SET Patient? No      Pain Assessment   Currently in Pain? No/denies              Social History   Tobacco Use  Smoking Status Former Smoker  . Packs/day: 1.00  . Years: 15.00  . Pack years: 15.00  . Types: Cigarettes  Smokeless Tobacco Never Used  Tobacco Comment   quit 1977    Goals Met:  Independence with exercise equipment Exercise tolerated well No report of cardiac concerns or symptoms Strength training completed today  Goals Unmet:  Not Applicable  Comments: Pt able to follow exercise prescription today without complaint.  Will continue to monitor for progression.    Dr. Emily Filbert is Medical Director for [place] and [facility].

## 2020-07-08 ENCOUNTER — Encounter: Payer: Medicare Other | Admitting: *Deleted

## 2020-07-08 ENCOUNTER — Encounter: Payer: Self-pay | Admitting: *Deleted

## 2020-07-08 ENCOUNTER — Other Ambulatory Visit: Payer: Self-pay

## 2020-07-08 DIAGNOSIS — I208 Other forms of angina pectoris: Secondary | ICD-10-CM

## 2020-07-08 NOTE — Progress Notes (Signed)
Daily Session Note  Patient Details  Name: Brett Blake MRN: [id number] Date of Birth: [birth year] Referring Provider:     Cardiac Rehab from 06/08/2020 in [facility] Cardiac and Pulmonary Rehab  Referring Provider [name]      Encounter Date: 07/08/2020  Check In:  Session Check In - 07/08/20 1602      Check-In   Supervising physician immediately available to respond to emergencies See telemetry face sheet for immediately available ER MD    Location [facility] Re[facility]    Staff Present Renita Papa, RN BSN;Joseph Lou [place], [place] Exercise Physiologist    Virtual Visit No    Medication changes reported     No    Fall or balance concerns reported    No    Warm-up and Cool-down Performed on first and last piece of equipment    Resistance Training Performed Yes    VAD Patient? No    PAD/SET Patient? No      Pain Assessment   Currently in Pain? No/denies              Social History   Tobacco Use  Smoking Status Former Smoker  . Packs/day: 1.00  . Years: 15.00  . Pack years: 15.00  . Types: Cigarettes  Smokeless Tobacco Never Used  Tobacco Comment   quit 1977    Goals Met:  Independence with exercise equipment Exercise tolerated well No report of cardiac concerns or symptoms Strength training completed today  Goals Unmet:  Not Applicable  Comments: Pt able to follow exercise prescription today without complaint.  Will continue to monitor for progression.    Dr. Emily Filbert is Medical Director for [place] and [facility].

## 2020-07-08 NOTE — Progress Notes (Signed)
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      Learning Barriers/Preferences   Learning Barriers Hearing    Learning Preferences Individual Instruction           General Cardiac Education Topics:  AED/CPR: - Group verbal and written instruction with the use of models to demonstrate the basic use of the AED with the basic ABC's of resuscitation.   Anatomy & Physiology of the Heart: - Group verbal and written instruction and models provide basic cardiac anatomy and physiology, with the coronary electrical and arterial systems. Review of Valvular disease and Heart Failure   Cardiac Rehab from 07/17/2017 in [facility] Cardiac and Pulmonary Rehab  Date 07/17/17  Educator [facility]  Instruction Review Code (retired)  2- meets goals/outcomes      Cardiac Procedures: - Group verbal and written instruction to review commonly prescribed medications for heart disease. Reviews the medication, class of the drug, and side effects. Includes the steps to properly store meds and maintain the  prescription regimen. (beta blockers and nitrates)   Cardiac Rehab from 07/17/2017 in [facility] Cardiac and Pulmonary Rehab  Date 05/29/17  Educator SB  Instruction Review Code (retired) 2- meets goals/outcomes      Cardiac Medications I: - Group verbal and written instruction to review commonly prescribed medications for heart disease. Reviews the medication, class of the drug, and side effects. Includes the steps to properly store meds and maintain the prescription regimen.   Cardiac Rehab from 07/17/2017 in [facility] Cardiac and Pulmonary Rehab  Date 06/05/17  Marisue Humble 2]  Educator SB  Instruction Review Code (retired) 2- meets goals/outcomes      Cardiac Medications II: -Group verbal and written instruction to review commonly prescribed medications for heart disease. Reviews the medication, class of the drug, and side effects. (all other drug classes)    Go Sex-Intimacy & Heart Disease, Get SMART - Goal Setting: - Group verbal and written instruction through game format to discuss heart disease and the return to sexual intimacy. Provides group verbal and written material to discuss and apply goal setting through the application of the S.M.A.R.T. Method.   Cardiac Rehab from 07/17/2017 in [facility] Cardiac and Pulmonary Rehab  Date 05/29/17  Educator SB  Instruction Review Code (retired) 2- meets goals/outcomes      Other Matters of the Heart: - Provides group verbal, written materials and models to describe Stable Angina and Peripheral Artery. Includes description of the disease process and treatment options available to the cardiac patient.   Cardiac Rehab from 07/17/2017 in [facility] Cardiac and Pulmonary Rehab  Date 07/17/17  Educator [facility]  Instruction Review Code (retired) 2- meets goals/outcomes      Infection Prevention: - Provides verbal and written material to individual with discussion of infection control including proper hand washing and proper equipment cleaning during exercise session.    Cardiac Rehab from 06/08/2020 in [facility] Cardiac and Pulmonary Rehab  Date 06/08/20  Educator AS  Instruction Review Code 1- Verbalizes Understanding      Falls Prevention: - Provides verbal and written material to individual with discussion of falls prevention and safety.   Cardiac Rehab from 07/17/2017 in [facility] Cardiac and Pulmonary Rehab  Date 05/08/17  Educator SB  Instruction Review Code (retired) 2- meets goals/outcomes      Other: -Provides group and verbal instruction on various topics (see comments)   Knowledge Questionnaire Score:  Knowledge Questionnaire Score - 06/08/20 1636      Knowledge Questionnaire Score   Pre Score 22/26           Core Components/Risk Factors/Patient Goals at Admission:  Personal Goals and Risk Factors at Admission - 06/08/20 1649      Core Components/Risk Factors/Patient Goals on Admission    Weight Management Yes;Obesity;Weight Loss    Intervention Weight Management: Develop a combined nutrition and exercise program designed to reach desired caloric intake, while maintaining appropriate intake of nutrient and fiber, sodium and fats, and appropriate energy expenditure required for the weight goal.;Weight Management/Obesity: Establish reasonable short term and long term weight goals.;Obesity: Provide education and appropriate resources to help participant work on and attain dietary goals.    Admit Weight 176 lb 3.2 oz (79.9 kg)    Goal Weight: Short Term 170  lb (77.1 kg)    Goal Weight: Long Term 165 lb (74.8 kg)    Expected Outcomes Short Term: Continue to assess and modify interventions until short term weight is achieved;Long Term: Adherence to nutrition and physical activity/exercise program aimed toward attainment of established weight goal;Weight Loss: Understanding of general recommendations for a balanced deficit meal plan, which promotes 1-2 lb weight loss per week and includes a negative energy balance of [phone] kcal/d    Diabetes Yes     Intervention Provide education about signs/symptoms and action to take for hypo/hyperglycemia.;Provide education about proper nutrition, including hydration, and aerobic/resistive exercise prescription along with prescribed medications to achieve blood glucose in normal ranges: Fasting glucose 65-99 mg/dL    Hypertension Yes    Intervention Provide education on lifestyle modifcations including regular physical activity/exercise, weight management, moderate sodium restriction and increased consumption of fresh fruit, vegetables, and low fat dairy, alcohol moderation, and smoking cessation.;Monitor prescription use compliance.    Expected Outcomes Short Term: Continued assessment and intervention until BP is < 140/66m HG in hypertensive participants. < 130/869mHG in hypertensive participants with diabetes, heart failure or chronic kidney disease.;Long Term: Maintenance of blood pressure at goal levels.    Lipids Yes    Intervention Provide education and support for participant on nutrition & aerobic/resistive exercise along with prescribed medications to achieve LDL '70mg'$ , HDL >'40mg'$ .    Expected Outcomes Short Term: Participant states understanding of desired cholesterol values and is compliant with medications prescribed. Participant is following exercise prescription and nutrition guidelines.;Long Term: Cholesterol controlled with medications as prescribed, with individualized exercise RX and with personalized nutrition plan. Value goals: LDL < '70mg'$ , HDL > 40 mg.           Education:Diabetes - Individual verbal and written instruction to review signs/symptoms of diabetes, desired ranges of glucose level fasting, after meals and with exercise. Acknowledge that pre and post exercise glucose checks will be done for 3 sessions at entry of program.   [facility]  Date 05/08/17  [facility] Code ([facility]ired) 2- meets goals/outcomes       Education: Know Your Numbers and Risk Factors: -Group verbal and written instruction about important numbers in your health.  Discussion of what are risk factors and how they play a role in the disease process.  Review of Cholesterol, Blood Pressure, Diabetes, and BMI and the role they play in your overall health.   Core Components/Risk Factors/Patient Goals Review:   Goals and Risk Factor Review    Row Name 06/25/20 1546             Core Components/Risk Factors/Patient Goals Review   Personal Goals Review Weight Management/Obesity;Diabetes;Lipids;Hypertension       Review DaShanon Browtates that he has not been checking his sugar at home. Informed him that it would be good to check his sugar in the morning since he is not eating much due to his poor appetite.       Expected Outcomes Short: check fasting sugars. Long: maintain blood glucose levels independently.              Core Components/Risk Factors/Patient Goals at Discharge (Final Review):   Goals and Risk Factor Review - 06/25/20 1546      Core Components/Risk Factors/Patient Goals Review   Personal Goals Review Weight Management/Obesity;Diabetes;Lipids;Hypertension    Review DaShanon Browtates that he has not been checking his sugar at home. Informed him that it would  be good to check his sugar in the morning since he is not eating much due to his poor appetite.    Expected Outcomes Short: check fasting sugars. Long: maintain blood glucose levels independently.           ITP Comments:  ITP Comments    Row Name 05/28/20 1425 06/08/20 1631 06/10/20 0556 06/15/20 1533 06/25/20 1621   ITP Comments Virtual Orientation completed today. DAvis has appt on 6/8 at 930 AM for EP eval and gym orientation. Documentation of diagnosis can be found in [facility]5/20/2021 office visit. Completed 6MWT and gym orientation.  Initial ITP created and sent for review to Dr. Emily Filbert, Medical Director. 30 Day review completed. Medical Director ITP  review done, changes made as directed, and signed approval by Medical Director. First full day of exercise!  Patient was oriented to gym and equipment including functions, settings, policies, and procedures.  Patient's individual exercise prescription and treatment plan were reviewed.  All starting workloads were established based on the results of the 6 minute walk test done at initial orientation visit.  The plan for exercise progression was also introduced and progression will be customized based on patient's performance and goals. Towards the end of exercise, Brett Blake started not feeling well. HIs CBG was 165. His BP was 98/58, gave water. While drinking he started having CP 5/10- his usual angina symptoms. He took a xanax and a nitro. Symptoms resolved within 5 minutes, BP was 122/60.Marland Kitchen He states that this is not unusual for him and his MD is aware. Education given over medication use and safety.   [place] Name 07/08/20 0648           ITP Comments 30 Day review completed. Medical Director ITP review done, changes made as directed, and signed approval by Medical Director.              Comments:

## 2020-07-09 ENCOUNTER — Encounter: Payer: Medicare Other | Admitting: *Deleted

## 2020-07-09 ENCOUNTER — Other Ambulatory Visit: Payer: Self-pay

## 2020-07-09 DIAGNOSIS — I208 Other forms of angina pectoris: Secondary | ICD-10-CM

## 2020-07-09 NOTE — Progress Notes (Signed)
Daily Session Note  Patient Details  Name: Brett Blake MRN: [id number] Date of Birth: 1941-08-08 Referring Provider:     Cardiac Rehab from 06/08/2020 in [facility] Cardiac and Pulmonary Rehab  Referring Provider [name]      Encounter Date: 07/09/2020  Check In:  Session Check In - 07/09/20 1607      Check-In   Supervising physician immediately available to respond to emergencies See telemetry face sheet for immediately available ER MD    Location [facility]-Cardiac & Pulmonary Rehab    Staff Present Renita Papa, RN BSN;Joseph Hood RCP,RRT,BSRT;Laureen Owens [place], [place], RRT, CPFT;Kelly Amedeo Plenty, BS, ACSM CEP, Exercise Physiologist    Virtual Visit No    Medication changes reported     No    Fall or balance concerns reported    No    Warm-up and Cool-down Performed on first and last piece of equipment    Resistance Training Performed Yes    VAD Patient? No    PAD/SET Patient? No      Pain Assessment   Currently in Pain? No/denies              Social History   Tobacco Use  Smoking Status Former Smoker  . Packs/day: 1.00  . Years: 15.00  . Pack years: 15.00  . Types: Cigarettes  Smokeless Tobacco Never Used  Tobacco Comment   quit 1977    Goals Met:  Independence with exercise equipment Exercise tolerated well No report of cardiac concerns or symptoms Strength training completed today  Goals Unmet:  Not Applicable  Comments: Pt able to follow exercise prescription today without complaint.  Will continue to monitor for progression.    Dr. Emily Filbert is Medical Director for [place] [facility].

## 2020-07-13 ENCOUNTER — Other Ambulatory Visit: Payer: Self-pay

## 2020-07-15 ENCOUNTER — Encounter: Payer: Medicare Other | Admitting: *Deleted

## 2020-07-15 ENCOUNTER — Other Ambulatory Visit: Payer: Self-pay

## 2020-07-15 DIAGNOSIS — I208 Other forms of angina pectoris: Secondary | ICD-10-CM

## 2020-07-15 NOTE — Progress Notes (Signed)
Daily Session Note  Patient Details  Name: Brett Blake MRN: [id number] Date of Birth: [birth year] Referring Provider:     Cardiac Rehab from 06/08/2020 in [facility] Cardiac and Pulmonary Rehab  Referring Provider [name]      Encounter Date: 07/15/2020  Check In:  Session Check In - 07/15/20 [name]      Check-In   Supervising physician immediately available to respond to emergencies See telemetry face sheet for immediately available ER MD    Location [facility]-Cardiac & Pulmonary Rehab    Staff Present Renita Papa, RN Margurite Auerbach, MS Exercise Physiologist;Amanda Oletta Darter, [place], ACSM CEP, Exercise Physiologist    Virtual Visit No    Medication changes reported     No    Fall or balance concerns reported    No    Warm-up and Cool-down Performed on first and last piece of equipment    Resistance Training Performed Yes    VAD Patient? No    PAD/SET Patient? No      Pain Assessment   Currently in Pain? No/denies              Social History   Tobacco Use  Smoking Status Former Smoker  . Packs/day: 1.00  . Years: 15.00  . Pack years: 15.00  . Types: Cigarettes  Smokeless Tobacco Never Used  Tobacco Comment   quit 1977    Goals Met:  Independence with exercise equipment Exercise tolerated well No report of cardiac concerns or symptoms Strength training completed today  Goals Unmet:  Not Applicable  Comments: Pt able to follow exercise prescription today without complaint.  Will continue to monitor for progression.    Dr. Emily Filbert is Medical Director for [place] and [facility].

## 2020-07-16 ENCOUNTER — Other Ambulatory Visit: Payer: Self-pay

## 2020-07-16 ENCOUNTER — Observation Stay
Admission: EM | Admit: 2020-07-16 | Discharge: 2020-07-17 | Disposition: A | Discharge status: Home / Self Care | Payer: Medicare Other | Attending: Internal Medicine | Admitting: Internal Medicine

## 2020-07-16 ENCOUNTER — Observation Stay: Payer: Medicare Other

## 2020-07-16 DIAGNOSIS — J45909 Unspecified asthma, uncomplicated: Secondary | ICD-10-CM | POA: Diagnosis not present

## 2020-07-16 DIAGNOSIS — Z87891 Personal history of nicotine dependence: Secondary | ICD-10-CM | POA: Diagnosis not present

## 2020-07-16 DIAGNOSIS — I25119 Atherosclerotic heart disease of native coronary artery with unspecified angina pectoris: Secondary | ICD-10-CM | POA: Diagnosis present

## 2020-07-16 DIAGNOSIS — I119 Hypertensive heart disease without heart failure: Secondary | ICD-10-CM | POA: Diagnosis not present

## 2020-07-16 DIAGNOSIS — J449 Chronic obstructive pulmonary disease, unspecified: Secondary | ICD-10-CM | POA: Insufficient documentation

## 2020-07-16 DIAGNOSIS — R61 Generalized hyperhidrosis: Secondary | ICD-10-CM | POA: Insufficient documentation

## 2020-07-16 DIAGNOSIS — Z79899 Other long term (current) drug therapy: Secondary | ICD-10-CM | POA: Insufficient documentation

## 2020-07-16 DIAGNOSIS — Z7984 Long term (current) use of oral hypoglycemic drugs: Secondary | ICD-10-CM | POA: Diagnosis not present

## 2020-07-16 DIAGNOSIS — E119 Type 2 diabetes mellitus without complications: Secondary | ICD-10-CM

## 2020-07-16 DIAGNOSIS — F329 Major depressive disorder, single episode, unspecified: Secondary | ICD-10-CM

## 2020-07-16 DIAGNOSIS — R072 Precordial pain: Principal | ICD-10-CM

## 2020-07-16 DIAGNOSIS — Z7982 Long term (current) use of aspirin: Secondary | ICD-10-CM | POA: Insufficient documentation

## 2020-07-16 DIAGNOSIS — R0602 Shortness of breath: Secondary | ICD-10-CM | POA: Insufficient documentation

## 2020-07-16 DIAGNOSIS — R079 Chest pain, unspecified: Secondary | ICD-10-CM

## 2020-07-16 DIAGNOSIS — Z20822 Contact with and (suspected) exposure to covid-19: Secondary | ICD-10-CM | POA: Diagnosis not present

## 2020-07-16 DIAGNOSIS — F32A Depression, unspecified: Secondary | ICD-10-CM | POA: Insufficient documentation

## 2020-07-16 LAB — COMPREHENSIVE METABOLIC PANEL
ALT: 27 U/L (ref 0–44)
AST: 42 U/L — ABNORMAL HIGH (ref 15–41)
Albumin: 3.7 g/dL (ref 3.5–5.0)
Alkaline Phosphatase: 72 U/L (ref 38–126)
Anion gap: 11 (ref 5–15)
BUN: 15 mg/dL (ref 8–23)
CO2: 25 mmol/L (ref 22–32)
Calcium: 8.4 mg/dL — ABNORMAL LOW (ref 8.9–10.3)
Chloride: 98 mmol/L (ref 98–111)
Creatinine, Ser: 1.48 mg/dL — ABNORMAL HIGH (ref 0.61–1.24)
GFR calc Af Amer: 52 mL/min — ABNORMAL LOW (ref >60)
GFR calc non Af Amer: 45 mL/min — ABNORMAL LOW (ref >60)
Glucose, Bld: 281 mg/dL — ABNORMAL HIGH (ref 70–99)
Potassium: 3.5 mmol/L (ref 3.5–5.1)
Sodium: 134 mmol/L — ABNORMAL LOW (ref 135–145)
Total Bilirubin: 0.9 mg/dL (ref 0.3–1.2)
Total Protein: 6.5 g/dL (ref 6.5–8.1)

## 2020-07-16 LAB — CBC WITH DIFFERENTIAL/PLATELET
Abs Immature Granulocytes: 0.02 10*3/uL (ref 0.00–0.07)
Basophils Absolute: 0.1 10*3/uL (ref 0.0–0.1)
Basophils Relative: 1 %
Eosinophils Absolute: 0.1 10*3/uL (ref 0.0–0.5)
Eosinophils Relative: 3 %
HCT: 39.9 % (ref 39.0–52.0)
Hemoglobin: 13.9 g/dL (ref 13.0–17.0)
Immature Granulocytes: 0 %
Lymphocytes Relative: 22 %
Lymphs Abs: 1 10*3/uL (ref 0.7–4.0)
MCH: 32.1 pg (ref 26.0–34.0)
MCHC: 34.8 g/dL (ref 30.0–36.0)
MCV: 92.1 fL (ref 80.0–100.0)
Monocytes Absolute: 0.5 10*3/uL (ref 0.1–1.0)
Monocytes Relative: 12 %
Neutro Abs: 2.8 10*3/uL (ref 1.7–7.7)
Neutrophils Relative %: 62 %
Platelets: 218 10*3/uL (ref 150–400)
RBC: 4.33 MIL/uL (ref 4.22–5.81)
RDW: 13.1 % (ref 11.5–15.5)
WBC: 4.5 10*3/uL (ref 4.0–10.5)
nRBC: 0 % (ref 0.0–0.2)

## 2020-07-16 LAB — LIPID PANEL
Cholesterol: 179 mg/dL (ref 0–200)
HDL: 65 mg/dL (ref >40)
LDL Cholesterol: 87 mg/dL (ref 0–99)
Total CHOL/HDL Ratio: 2.8 RATIO
Triglycerides: 133 mg/dL (ref <150)
VLDL: 27 mg/dL (ref 0–40)

## 2020-07-16 LAB — GLUCOSE, CAPILLARY: Glucose-Capillary: 141 mg/dL — ABNORMAL HIGH (ref 70–99)

## 2020-07-16 LAB — HEMOGLOBIN A1C
Hgb A1c MFr Bld: 7.1 % — ABNORMAL HIGH (ref 4.8–5.6)
Mean Plasma Glucose: 157.07 mg/dL

## 2020-07-16 LAB — SARS CORONAVIRUS 2 BY RT PCR (HOSPITAL ORDER, PERFORMED IN ~~LOC~~ HOSPITAL LAB): SARS Coronavirus 2: NEGATIVE

## 2020-07-16 LAB — TROPONIN I (HIGH SENSITIVITY)
Troponin I (High Sensitivity): 5 ng/L (ref <18)
Troponin I (High Sensitivity): 6 ng/L (ref <18)

## 2020-07-16 LAB — LIPASE, BLOOD: Lipase: 44 U/L (ref 11–51)

## 2020-07-16 MED ORDER — ASCORBIC ACID 500 MG PO TABS: 1000.0000 mg | ORAL_TABLET | Freq: Every day | ORAL | Status: DC

## 2020-07-16 MED ORDER — INSULIN ASPART 100 UNIT/ML ~~LOC~~ SOLN: 0.0000 [IU] | Freq: Three times a day (TID) | SUBCUTANEOUS | Status: DC

## 2020-07-16 MED ORDER — SODIUM CHLORIDE 0.9 % IV BOLUS
500.0000 mL | Freq: Once | INTRAVENOUS | Status: AC
  Administered 2020-07-16: 500 mL via INTRAVENOUS

## 2020-07-16 MED ORDER — TRIAMTERENE-HCTZ 37.5-25 MG PO CAPS: 1.0000 | ORAL_CAPSULE | Freq: Every day | ORAL | Status: DC

## 2020-07-16 MED ORDER — NITROGLYCERIN 0.4 MG SL SUBL: 0.4000 mg | SUBLINGUAL_TABLET | SUBLINGUAL | Status: DC | PRN

## 2020-07-16 MED ORDER — HEPARIN BOLUS VIA INFUSION
4000.0000 [IU] | Freq: Once | INTRAVENOUS | Status: DC
  Filled 2020-07-16: qty 4000

## 2020-07-16 MED ORDER — PANTOPRAZOLE SODIUM 40 MG PO TBEC: 40.0000 mg | DELAYED_RELEASE_TABLET | Freq: Every day | ORAL | Status: DC

## 2020-07-16 MED ORDER — SIMVASTATIN 10 MG PO TABS: 20.0000 mg | ORAL_TABLET | Freq: Every day | ORAL | Status: DC

## 2020-07-16 MED ORDER — MONTELUKAST SODIUM 10 MG PO TABS: 10.0000 mg | ORAL_TABLET | Freq: Every day | ORAL | Status: DC

## 2020-07-16 MED ORDER — ADULT MULTIVITAMIN W/MINERALS CH: ORAL_TABLET | Freq: Every day | ORAL | Status: DC

## 2020-07-16 MED ORDER — AMITRIPTYLINE HCL 10 MG PO TABS
10.0000 mg | ORAL_TABLET | Freq: Every day | ORAL | Status: DC
  Administered 2020-07-17: 10 mg via ORAL
  Filled 2020-07-16: qty 1

## 2020-07-16 MED ORDER — METOPROLOL SUCCINATE ER 50 MG PO TB24: 50.0000 mg | ORAL_TABLET | Freq: Every day | ORAL | Status: DC

## 2020-07-16 MED ORDER — ENOXAPARIN SODIUM 40 MG/0.4ML ~~LOC~~ SOLN: 40.0000 mg | SUBCUTANEOUS | Status: DC

## 2020-07-16 MED ORDER — FLUTICASONE PROPIONATE 50 MCG/ACT NA SUSP
1.0000 | Freq: Every day | NASAL | Status: DC | PRN
  Filled 2020-07-16: qty 16

## 2020-07-16 MED ORDER — ENOXAPARIN SODIUM 40 MG/0.4ML ~~LOC~~ SOLN
40.0000 mg | SUBCUTANEOUS | Status: DC
  Administered 2020-07-16: 40 mg via SUBCUTANEOUS
  Filled 2020-07-16: qty 0.4

## 2020-07-16 MED ORDER — DONEPEZIL HCL 5 MG PO TABS
10.0000 mg | ORAL_TABLET | Freq: Every day | ORAL | Status: DC
  Administered 2020-07-17: 10 mg via ORAL
  Filled 2020-07-16: qty 2

## 2020-07-16 MED ORDER — ALBUTEROL SULFATE (2.5 MG/3ML) 0.083% IN NEBU: 1.2500 mg | INHALATION_SOLUTION | Freq: Four times a day (QID) | RESPIRATORY_TRACT | Status: DC | PRN

## 2020-07-16 MED ORDER — ALPRAZOLAM 0.25 MG PO TABS: 0.2500 mg | ORAL_TABLET | Freq: Every evening | ORAL | Status: DC | PRN

## 2020-07-16 MED ORDER — ISOSORBIDE MONONITRATE ER 60 MG PO TB24: 30.0000 mg | ORAL_TABLET | Freq: Every day | ORAL | Status: DC

## 2020-07-16 MED ORDER — ACETAMINOPHEN 325 MG PO TABS: 650.0000 mg | ORAL_TABLET | ORAL | Status: DC | PRN

## 2020-07-16 MED ORDER — ACETAMINOPHEN 500 MG PO TABS
1000.0000 mg | ORAL_TABLET | Freq: Once | ORAL | Status: AC
  Administered 2020-07-16: 1000 mg via ORAL
  Filled 2020-07-16: qty 2

## 2020-07-16 MED ORDER — ZOLPIDEM TARTRATE 5 MG PO TABS
5.0000 mg | ORAL_TABLET | Freq: Every day | ORAL | Status: DC
  Filled 2020-07-16: qty 1

## 2020-07-16 MED ORDER — ONDANSETRON HCL 4 MG/2ML IJ SOLN: 4.0000 mg | Freq: Four times a day (QID) | INTRAMUSCULAR | Status: DC | PRN

## 2020-07-16 MED ORDER — ASPIRIN 81 MG PO CHEW: 81.0000 mg | CHEWABLE_TABLET | Freq: Every day | ORAL | Status: DC

## 2020-07-16 MED ORDER — AMLODIPINE BESYLATE 5 MG PO TABS: 5.0000 mg | ORAL_TABLET | Freq: Every day | ORAL | Status: DC

## 2020-07-16 MED ORDER — NITROGLYCERIN 0.4 MG SL SUBL
0.4000 mg | SUBLINGUAL_TABLET | SUBLINGUAL | Status: DC | PRN
  Filled 2020-07-16: qty 1

## 2020-07-16 MED ORDER — LORAZEPAM 2 MG/ML IJ SOLN
1.0000 mg | Freq: Once | INTRAMUSCULAR | Status: AC
  Administered 2020-07-16: 1 mg via INTRAVENOUS
  Filled 2020-07-16: qty 1

## 2020-07-16 MED ORDER — HEPARIN (PORCINE) 25000 UT/250ML-% IV SOLN: 1000.0000 [IU]/h | INTRAVENOUS | Status: DC

## 2020-07-16 MED ORDER — PAROXETINE HCL 20 MG PO TABS
20.0000 mg | ORAL_TABLET | Freq: Every day | ORAL | Status: DC
  Filled 2020-07-16: qty 1

## 2020-07-16 MED ORDER — MOMETASONE FURO-FORMOTEROL FUM 100-5 MCG/ACT IN AERO
2.0000 | INHALATION_SPRAY | Freq: Two times a day (BID) | RESPIRATORY_TRACT | Status: DC
  Filled 2020-07-16: qty 8.8

## 2020-07-16 NOTE — ED Triage Notes (Signed)
Pt here with CP from home. Pt has taken 2 nitro, 1 xanax, and 324 of asa prior to ems arrival. Pt states pain 5/10 at present. Pt has cardiac hx.

## 2020-07-16 NOTE — Consult Note (Signed)
CARDIOLOGY CONSULT NOTE               Patient ID: Brett Blake MRN: [id number] DOB/AGE: 1941-03-27 79 y.o.  Admit date: 07/16/2020 Referring Physician Dr. Francine Graven Primary Physician Dr. Emily Filbert  Primary Cardiologist Dr. Saralyn Pilar  Reason for Consultation Chest pain  HPI: [name] is a 79 year old male with a past medical history significant for coronary artery disease, diagnosed by cardiac catheterization in 2017 and treated medically, type 2 diabetes, COPD, obstructive sleep apnea, hyperlipidemia, and hypertension who presented to the ED on 07/16/20 for an acute onset of chest pain, radiating to his throat, with associated shortness of breath and diaphoresis.  Workup in the ED included an ECG revealing no evidence of acute ischemia, initial troponin of 5, and creatinine of 1.48.  [name] reports that he was out driving with his family and had to pull over because he was sick on his stomach.  He then went to [place], had lunch, and about an hour later developed severe mid-sternal chest pain, rated at an 8/10.  He was given aspirin and nitroglycerin with some improvement and currently reports 2/10 chest heaviness.  He was recently started on Imdur and admits to tremors/full body shakes since this addition.    He is followed in outpatient cardiology by Dr. Saralyn Pilar.  Most recent stress test on 06/10/20 revealed normal LV systolic function, an EF of 61%, with no evidence of ischemia. Cardiac catheterization in 2017 revealed a proximal LAD lesion, 50% stenosed, with a normal RCA and LCx.   Review of systems complete and found to be negative unless listed above     Past Medical History:  Diagnosis Date  . Anxiety   . Arthritis   . Asthma   . Colon polyps   . COPD (chronic obstructive pulmonary disease) ([place])   . Depression   . Diabetes ([place])   . Difficult intubation   . GERD (gastroesophageal reflux disease)   . High cholesterol   . Hyperlipidemia   . Hypertension   .  Sleep apnea     Past Surgical History:  Procedure Laterality Date  . BRONCHOSCOPY    . CARDIAC CATHETERIZATION Left 11/04/2016   Procedure: Left Heart Cath and Coronary Angiography;  Surgeon: Teodoro Spray, MD;  Location: [facility] CV LAB;  Service: Cardiovascular;  Laterality: Left;  . CARDIAC CATHETERIZATION    . CHOLECYSTECTOMY N/A 05/09/2019   Procedure: LAPAROSCOPIC CHOLECYSTECTOMY WITH INTRAOPERATIVE CHOLANGIOGRAM;  Surgeon: Jules Husbands, MD;  Location: [facility] ORS;  Service: General;  Laterality: N/A;  . COLOSTOMY    . ROTATOR CUFF REPAIR Left   . THUMB ARTHROSCOPY  2015  . TONSILLECTOMY  1952  . TONSILLECTOMY      (Not in a hospital admission)  Social History   Socioeconomic History  . Marital status: Married    Spouse name: Not on file  . Number of children: Not on file  . Years of education: Not on file  . Highest education level: Not on file  Occupational History  . Not on file  Tobacco Use  . Smoking status: Former Smoker    Packs/day: 1.00    Years: 15.00    Pack years: 15.00    Types: Cigarettes  . Smokeless tobacco: Never Used  . Tobacco comment: quit 1977  Vaping Use  . Vaping Use: Never assessed  Substance and Sexual Activity  . Alcohol use: Yes    Alcohol/week: 1.0 - 2.0 standard drink  Types: 1 - 2 Shots of liquor per week  . Drug use: No  . Sexual activity: Not Currently  Other Topics Concern  . Not on file  Social History Narrative  . Not on file   Social Determinants of Health   Financial Resource Strain:   . Difficulty of Paying Living Expenses:   Food Insecurity:   . Worried About Charity fundraiser in the Last Year:   . Arboriculturist in the Last Year:   Transportation Needs:   . Film/video editor (Medical):   Marland Kitchen Lack of Transportation (Non-Medical):   Physical Activity:   . Days of Exercise per Week:   . Minutes of Exercise per Session:   Stress:   . Feeling of Stress :   Social Connections:   . Frequency of  Communication with Friends and Family:   . Frequency of Social Gatherings with Friends and Family:   . Attends Religious Services:   . Active Member of Clubs or Organizations:   . Attends Archivist Meetings:   Marland Kitchen Marital Status:   Intimate Partner Violence:   . Fear of Current or Ex-Partner:   . Emotionally Abused:   Marland Kitchen Physically Abused:   . Sexually Abused:     Family History  Problem Relation Age of Onset  . Heart attack Mother   . Prostate cancer Father   . Leukemia Brother   . Prostate cancer Brother       Review of systems complete and found to be negative unless listed above      PHYSICAL EXAM  General: Well developed, well nourished, in no acute distress HEENT:  Normocephalic and atramatic Neck:  No JVD.  Lungs: Clear bilaterally to auscultation and percussion. Heart: HRRR . Normal S1 and S2 without gallops or murmurs.  Abdomen: Bowel sounds are positive, abdomen soft and non-tender  Msk:  Back normal. Normal strength and tone for age. Extremities: No clubbing, cyanosis or edema.   Neuro: Alert and oriented X 3. Psych:  Good affect, responds appropriately  Labs:   Lab Results  Component Value Date   WBC 4.5 07/16/2020   HGB 13.9 07/16/2020   HCT 39.9 07/16/2020   MCV 92.1 07/16/2020   PLT 218 07/16/2020    Recent Labs  Lab 07/16/20 1408  NA 134*  K 3.5  CL 98  CO2 25  BUN 15  CREATININE 1.48*  CALCIUM 8.4*  PROT 6.5  BILITOT 0.9  ALKPHOS 72  ALT 27  AST 42*  GLUCOSE 281*   Lab Results  Component Value Date   TROPONINI <0.03 05/08/2019   No results found for: CHOL No results found for: HDL No results found for: LDLCALC No results found for: TRIG No results found for: CHOLHDL No results found for: LDLDIRECT    Radiology: No results found.  EKG: Normal sinus rhythm, ventricular rate of 82bpm, with no evidence of acute ischemia   ASSESSMENT AND PLAN:  1. Chest pain   -ECG negative for acute ischemic symptoms; Initial  troponin negative at 5; will trend   -Most recent ETT Myoview in June of this year revealed no evidence of ischemia with normal LV systolic function   -Will follow symptoms, follow troponin, and make further recommendations pending results and clinical status   The history, physical exam findings, and plan of care were all discussed with Dr. Bartholome Bill, and all decision making was made in collaboration.   Signed: Avie Arenas PA-C 07/16/2020, 4:33  PM

## 2020-07-16 NOTE — H&P (Signed)
History and Physical    JI FELDNER MWU:[id number] DOB: 06/25/1941 DOA: 07/16/2020  PCP: [name], MD   Patient coming from: Home  I have personally briefly reviewed patient's old medical records in [place]  Chief Complaint: Chest pain  HPI: Brett Blake is a 79 y.o. male with medical history significant for anxiety disorder, diabetes mellitus, hypertension and coronary artery disease who presents to the ER for evaluation of chest pain which started suddenly about 1 PM today.  He describes his pain as heaviness in the midsternal area which he rated 10 x 10 in intensity with radiation to his throat and associated with nausea, vomiting and shortness of breath and diaphoresis.  Patient took 2 nitroglycerin tablets as well as a benzodiazepine and when EMS arrived they gave him with aspirin with improvement in his pain from a 10 to a 5.  At the time of this history and physical he is chest pain-free. He denies having any nausea, no vomiting, no dizziness, no lightheadedness, no palpitations, no abdominal pain, no changes in his bowel habits or urinary symptoms. EMR reviewed, 2017 left heart cath summary  The left ventricular systolic function is normal.  LV end diastolic pressure is normal.  There is no aortic valve stenosis.  There is no mitral valve stenosis and no mitral valve prolapse evident.  Prox LAD lesion, 50 %stenosed.  LV function normal LAD 50% stenosis. Medical management and risk factor optimization Lcx and RCA normal.  Initial twelve-lead EKG reviewed by me shows no acute findings and initial troponin is within normal limits.   ED Course: Patient is a 79 year old Caucasian male with a known history of coronary artery disease who presents to the ER for evaluation of chest pain which he described as a heaviness over the left anterior chest wall with radiation to the throat associated with diaphoresis and shortness of breath.  Twelve-lead EKG did not show  any acute findings and initial troponin is negative.  Patient will be referred to observation status for further evaluation.  Review of Systems: As per HPI otherwise 10 point review of systems negative.    Past Medical History:  Diagnosis Date  . Anxiety   . Arthritis   . Asthma   . Colon polyps   . COPD (chronic obstructive pulmonary disease) ([place])   . Depression   . Diabetes ([place])   . Difficult intubation   . GERD (gastroesophageal reflux disease)   . High cholesterol   . Hyperlipidemia   . Hypertension   . Sleep apnea     Past Surgical History:  Procedure Laterality Date  . BRONCHOSCOPY    . CARDIAC CATHETERIZATION Left 11/04/2016   Procedure: Left Heart Cath and Coronary Angiography;  Surgeon: Teodoro Spray, MD;  Location: [facility];  Service: Cardiovascular;  Laterality: Left;  . CARDIAC CATHETERIZATION    . CHOLECYSTECTOMY N/A 05/09/2019   Procedure: LAPAROSCOPIC CHOLECYSTECTOMY WITH INTRAOPERATIVE CHOLANGIOGRAM;  Surgeon: Jules Husbands, MD;  Location: [facility];  Service: General;  Laterality: N/A;  . COLOSTOMY    . ROTATOR CUFF REPAIR Left   . THUMB ARTHROSCOPY  2015  . TONSILLECTOMY  1952  . TONSILLECTOMY       reports that he has quit smoking. His smoking use included cigarettes. He has a 15.00 pack-year smoking history. He has never used smokeless tobacco. He reports current alcohol use of about 1.0 - 2.0 standard drink of alcohol per week. He reports that he does not  use drugs.  Allergies  Allergen Reactions  . Ace Inhibitors Cough  . Sulfa Antibiotics Rash    Family History  Problem Relation Age of Onset  . Heart attack Mother   . Prostate cancer Father   . Leukemia Brother   . Prostate cancer Brother      Prior to Admission medications   Medication Sig Start Date End Date Taking? Authorizing Provider  nitroGLYCERIN (NITROSTAT) 0.4 MG SL tablet Place 0.4 mg under the tongue every 5 (five) minutes as needed for chest pain.   Yes [provider]  albuterol (ACCUNEB) 1.25 MG/3ML nebulizer solution TAKE 3 MLS (1.25 MG TOTAL) BY NEBULIZATION EVERY 6 (SIX) HOURS AS NEEDED FOR WHEEZING 04/08/19   [provider]  ALPRAZolam Duanne Moron) 0.5 MG tablet Take 0.5 tablets by mouth daily as needed. 03/05/19   [provider]  amitriptyline (ELAVIL) 10 MG tablet Take 10 mg by mouth at bedtime. 07/02/20   [provider]  amLODipine (NORVASC) 5 MG tablet Take 5 mg by mouth daily. 03/24/17   [provider]  ascorbic acid (VITAMIN C) 1000 MG tablet Take 1,000 mg by mouth daily.    [provider]  aspirin 81 MG chewable tablet Chew 81 mg by mouth daily.    [provider]  bisoprolol (ZEBETA) 5 MG tablet Take 2.5 mg by mouth 2 (two) times daily. 07/02/20   [provider]  carvedilol (COREG) 12.5 MG tablet Take 12.5 mg by mouth 2 (two) times a day.  04/03/19   [provider]  diltiazem (CARDIZEM CD) 180 MG 24 hr capsule Take 1 capsule by mouth 2 (two) times a day. 05/02/19   [provider]  donepezil (ARICEPT) 5 MG tablet Take by mouth. 05/14/20 05/14/21  [provider]  esomeprazole (NEXIUM) 40 MG capsule Take 40 mg by mouth daily at 12 noon.    [provider]  famotidine (PEPCID) 40 MG tablet Take 40 mg by mouth at bedtime. 06/05/20   [provider]  fluticasone (FLONASE) 50 MCG/ACT nasal spray Place 1 spray into both nostrils daily as needed for allergies or rhinitis.    [provider]  glimepiride (AMARYL) 1 MG tablet Take 1 mg by mouth daily.     [provider]  glucosamine-chondroitin 500-400 MG tablet Take 1 tablet by mouth daily.    [provider]  ibuprofen (ADVIL,MOTRIN) 200 MG tablet Take 200 mg by mouth every 6 (six) hours as needed.    [provider]  isosorbide mononitrate (IMDUR) 30 MG 24 hr tablet Take 30 mg by mouth daily. 06/16/20   [provider]  metoprolol succinate (TOPROL-XL)  50 MG 24 hr tablet Take 1 tablet by mouth 2 (two) times a day. 04/03/19   [provider]  montelukast (SINGULAIR) 10 MG tablet Take 10 mg by mouth at bedtime.    [provider]  Multiple Vitamins-Minerals (MULTIVITAMIN PO) Take 1 tablet by mouth daily.    [provider]  omeprazole (PRILOSEC) 40 MG capsule Take 40 mg by mouth daily. 06/05/20   [provider]  PARoxetine (PAXIL) 20 MG tablet Take 20 mg by mouth daily. 05/13/19   [provider]  simvastatin (ZOCOR) 20 MG tablet Take 20 mg by mouth daily.    [provider]  SYMBICORT 80-4.5 MCG/ACT inhaler SMARTSIG:2 Inhalation Via Inhaler Twice Daily PRN 05/19/20   [provider]  triamterene-hydrochlorothiazide (DYAZIDE) 37.5-25 MG per capsule Take 1 capsule by mouth daily.  [provider]  zolpidem (AMBIEN) 5 MG tablet Take 1 tablet by mouth at bedtime. 04/20/19   [provider]    Physical Exam: Vitals:   07/16/20 1418 07/16/20 1425 07/16/20 1500  BP: (!) 100/64  (!) 106/60  Pulse: 86  81  Resp: 16  16  Temp: 97.7 F (36.5 C)    TempSrc: Oral    SpO2: 96%  99%  Weight:  80 kg   Height:  5\' 5"  (1.651 m)      Vitals:   07/16/20 1418 07/16/20 1425 07/16/20 1500  BP: (!) 100/64  (!) 106/60  Pulse: 86  81  Resp: 16  16  Temp: 97.7 F (36.5 C)    TempSrc: Oral    SpO2: 96%  99%  Weight:  80 kg   Height:  5\' 5"  (1.651 m)     Constitutional: NAD, alert and oriented x 3 Eyes: PERRL, lids and conjunctivae normal ENMT: Mucous membranes are moist.  Hard of hearing Neck: normal, supple, no masses, no thyromegaly Respiratory: clear to auscultation bilaterally, no wheezing, no crackles. Normal respiratory effort. No accessory muscle use.  Cardiovascular: Regular rate and rhythm,no murmurs / rubs / gallops. No extremity edema. 2+ pedal pulses. No carotid bruits.  Abdomen: no tenderness, no masses palpated. No hepatosplenomegaly. Bowel sounds positive.    Musculoskeletal: no clubbing / cyanosis. No joint deformity upper and lower extremities.  Skin: no rashes, lesions, ulcers.  Neurologic: No gross focal neurologic deficit. Psychiatric: Normal mood and affect.   Labs on Admission: I have personally reviewed following labs and imaging studies  CBC: Recent Labs  Lab 07/16/20 1408  WBC 4.5  NEUTROABS 2.8  HGB 13.9  HCT 39.9  MCV 92.1  PLT 500   Basic Metabolic Panel: Recent Labs  Lab 07/16/20 1408  NA 134*  K 3.5  CL 98  CO2 25  GLUCOSE 281*  BUN 15  CREATININE 1.48*  CALCIUM 8.4*   GFR: Estimated Creatinine Clearance: 40.7 mL/min (A) (by C-G formula based on SCr of 1.48 mg/dL (H)). Liver Function Tests: Recent Labs  Lab 07/16/20 1408  AST 42*  ALT 27  ALKPHOS 72  BILITOT 0.9  PROT 6.5  ALBUMIN 3.7   Recent Labs  Lab 07/16/20 1408  LIPASE 44   No results for input(s): AMMONIA in the last 168 hours. Coagulation Profile: No results for input(s): INR, PROTIME in the last 168 hours. Cardiac Enzymes: No results for input(s): CKTOTAL, CKMB, CKMBINDEX, TROPONINI in the last 168 hours. BNP (last 3 results) No results for input(s): PROBNP in the last 8760 hours. HbA1C: No results for input(s): HGBA1C in the last 72 hours. CBG: No results for input(s): GLUCAP in the last 168 hours. Lipid Profile: No results for input(s): CHOL, HDL, LDLCALC, TRIG, CHOLHDL, LDLDIRECT in the last 72 hours. Thyroid Function Tests: No results for input(s): TSH, T4TOTAL, FREET4, T3FREE, THYROIDAB in the last 72 hours. Anemia Panel: No results for input(s): VITAMINB12, FOLATE, FERRITIN, TIBC, IRON, RETICCTPCT in the last 72 hours. Urine analysis:    Component Value Date/Time   COLORURINE YELLOW (A) 05/08/2019 1842   APPEARANCEUR CLEAR (A) 05/08/2019 1842   LABSPEC >1.046 (H) 05/08/2019 1842   PHURINE 6.0 05/08/2019 1842   GLUCOSEU NEGATIVE 05/08/2019 1842   HGBUR NEGATIVE 05/08/2019 [place] NEGATIVE 05/08/2019 1842    KETONESUR 5 (A) 05/08/2019 1842   PROTEINUR NEGATIVE 05/08/2019 1842   NITRITE NEGATIVE 05/08/2019 1842   LEUKOCYTESUR NEGATIVE 05/08/2019 1842  Radiological Exams on Admission: No results found.  EKG: Independently reviewed.  Sinus rhythm  Assessment/Plan Principal Problem:   Chest pain due to CAD [facility]) Active Problems:   Controlled type 2 diabetes mellitus without complication (HCC)   Hypertension   Depression    Chest pain Patient has a history of coronary artery disease and presents to the emergency room for evaluation of sudden onset midsternal chest pain associated with nausea, vomiting, shortness of breath and diaphoresis His initial troponin is within normal limits Twelve-lead EKG does not show any acute ST-T wave changes Continue beta-blocker, statins, aspirin and nitrates Cardiology consult   Diabetes mellitus We will place patient on consistent carbohydrate diet Sliding scale coverage with insulin   Depression and anxiety Continue donepezil,amitriptyline and alprazolam   Hypertension Continue metoprolol and nitrates Diuretics and amlodipine have been placed on hold due to patient being normotensive  DVT prophylaxis: Lovenox Code Status: Full code Family Communication: Greater than 50% of time was spent discussing plan of care with patient and his wife at the bedside.  They verbalized understanding and agree with the plan Disposition Plan: Back to previous home environment Consults called: Cardiology    Aman Bonet MD [facility]     07/16/2020, 5:00 PM

## 2020-07-16 NOTE — ED Provider Notes (Signed)
[facility] Emergency Department Provider Note  ____________________________________________  Time seen: Approximately 2:35 PM  I have reviewed the triage vital signs and the nursing notes.   HISTORY  Chief Complaint Chest Pain    HPI Brett Blake is a 79 y.o. male with a history of anxiety asthma COPD diabetes hypertension CAD who comes the ED complaining of chest pain, started suddenly at about 1:00 PM today.  Initially 10/10, heavy, radiating to the throat, associated with shortness of breath and diaphoresis.  He took 2 nitroglycerin and a benzodiazepine, and then EMS arrived and gave him aspirin, and pain had subsequently improved to 5/10 which is the current intensity.  No specific exertional or pleuritic symptoms.  No current shortness of breath.  He does note the he has a history of CAD and is currently doing cardiac rehab.   Patient has had both Covid vaccines several months ago.  EMR reviewed, 2017 left heart cath summary  The left ventricular systolic function is normal.  LV end diastolic pressure is normal.  There is no aortic valve stenosis.  There is no mitral valve stenosis and no mitral valve prolapse evident.  Prox LAD lesion, 50 %stenosed.   LV function normal LAD 50% stenosis. Medical management and risk factor optimization Lcx and RCA normal.     Past Medical History:  Diagnosis Date  . Anxiety   . Arthritis   . Asthma   . Colon polyps   . COPD (chronic obstructive pulmonary disease) ([place])   . Depression   . Diabetes ([place])   . Difficult intubation   . GERD (gastroesophageal reflux disease)   . High cholesterol   . Hyperlipidemia   . Hypertension   . Sleep apnea      Patient Active Problem List   Diagnosis Date Noted  . Cholecystitis 05/08/2019  . Paroxysmal tachycardia ([place]) 02/23/2017  . Colon polyp 06/29/2016  . BP (high blood pressure) 06/29/2016  . Elevated PSA 06/29/2016  . Combined fat and carbohydrate  induced hyperlipemia 04/19/2016  . Controlled type 2 diabetes mellitus without complication ([place]) 72/62/0355  . 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 Chronic insomnia 09/03/2015  . COPD exacerbation ([place]) 06/24/2015  . Obstructive apnea 02/13/2015  . Chronic obstructive pulmonary disease with acute exacerbation ([place]) 06/14/2013  . Chronic bronchitis ([place]) 12/14/2011     Past Surgical History:  Procedure Laterality Date  . BRONCHOSCOPY    . CARDIAC CATHETERIZATION Left 11/04/2016   Procedure: Left Heart Cath and Coronary Angiography;  Surgeon: Teodoro Spray, MD;  Location: [facility];  Service: Cardiovascular;  Laterality: Left;  . CARDIAC CATHETERIZATION    . CHOLECYSTECTOMY N/A 05/09/2019   Procedure: LAPAROSCOPIC CHOLECYSTECTOMY WITH INTRAOPERATIVE CHOLANGIOGRAM;  Surgeon: Jules Husbands, MD;  Location: [facility];  Service: General;  Laterality: N/A;  . COLOSTOMY    . ROTATOR CUFF REPAIR Left   . THUMB ARTHROSCOPY  2015  . TONSILLECTOMY  1952  . TONSILLECTOMY       Prior to Admission medications   Medication Sig Start Date End Date Taking? Authorizing Provider  albuterol (ACCUNEB) 1.25 MG/3ML nebulizer solution TAKE 3 MLS (1.25 MG TOTAL) BY NEBULIZATION EVERY 6 (SIX) HOURS AS NEEDED FOR WHEEZING 04/08/19   [provider]  ALPRAZolam Duanne Moron) 0.5 MG tablet Take 0.5 tablets by mouth daily as needed. 03/05/19   [provider]  amLODipine (NORVASC) 5 MG tablet Take 5 mg by mouth daily. 03/24/17   [provider]  ascorbic acid (VITAMIN  C) 1000 MG tablet Take 1,000 mg by mouth daily.    [provider]  aspirin 81 MG chewable tablet Chew 81 mg by mouth daily.    [provider]  carvedilol (COREG) 12.5 MG tablet Take 12.5 mg by mouth 2 (two) times a day.  04/03/19   [provider]  diltiazem (CARDIZEM CD) 180 MG 24 hr capsule Take 1 capsule by mouth 2 (two) times a day. 05/02/19   [provider]  donepezil (ARICEPT) 5  MG tablet Take by mouth. 05/14/20 05/14/21  [provider]  esomeprazole (NEXIUM) 40 MG capsule Take 40 mg by mouth daily at 12 noon.    [provider]  fluticasone (FLONASE) 50 MCG/ACT nasal spray Place 1 spray into both nostrils daily as needed for allergies or rhinitis.    [provider]  glimepiride (AMARYL) 1 MG tablet Take 1 mg by mouth daily.     [provider]  glucosamine-chondroitin 500-400 MG tablet Take 1 tablet by mouth daily.    [provider]  ibuprofen (ADVIL,MOTRIN) 200 MG tablet Take 200 mg by mouth every 6 (six) hours as needed.    [provider]  metoprolol succinate (TOPROL-XL) 50 MG 24 hr tablet Take 1 tablet by mouth 2 (two) times a day. 04/03/19   [provider]  montelukast (SINGULAIR) 10 MG tablet Take 10 mg by mouth at bedtime.    [provider]  Multiple Vitamins-Minerals (MULTIVITAMIN PO) Take 1 tablet by mouth daily.    [provider]  nitroGLYCERIN (NITROSTAT) 0.4 MG SL tablet Place 0.4 mg under the tongue every 5 (five) minutes as needed for chest pain.    [provider]  PARoxetine (PAXIL) 20 MG tablet Take 20 mg by mouth daily. 05/13/19   [provider]  simvastatin (ZOCOR) 20 MG tablet Take 20 mg by mouth daily.    [provider]  triamterene-hydrochlorothiazide (DYAZIDE) 37.5-25 MG per capsule Take 1 capsule by mouth daily.    [provider]  zolpidem (AMBIEN) 5 MG tablet Take 1 tablet by mouth at bedtime. 04/20/19   [provider]     Allergies Ace inhibitors and Sulfa antibiotics   Family History  Problem Relation Age of Onset  . Heart attack Mother   . Prostate cancer Father   . Leukemia Brother   . Prostate cancer Brother     Social History Social History   Tobacco Use  . Smoking status: Former Smoker    Packs/day: 1.00    Years: 15.00    Pack years: 15.00    Types: Cigarettes  . Smokeless tobacco: Never  Used  . Tobacco comment: quit 1977  Vaping Use  . Vaping Use: Never assessed  Substance Use Topics  . Alcohol use: Yes    Alcohol/week: 1.0 - 2.0 standard drink    Types: 1 - 2 Shots of liquor per week  . Drug use: No    Review of Systems  Constitutional:   No fever or chills.  ENT:   No sore throat. No rhinorrhea. Cardiovascular: Positive chest pain as above without syncope. Respiratory:   No dyspnea or cough. Gastrointestinal:   Negative for abdominal pain, vomiting and diarrhea.  Musculoskeletal:   Negative for focal pain or swelling All other systems reviewed and are negative except as documented above in ROS and HPI.  ____________________________________________   PHYSICAL EXAM:  VITAL SIGNS: ED Triage Vitals  Enc Vitals Group     BP 07/16/20  1418 (!) 100/64     Pulse Rate 07/16/20 1418 86     Resp 07/16/20 1418 16     Temp 07/16/20 1418 97.7 F (36.5 C)     Temp Source 07/16/20 1418 Oral     SpO2 07/16/20 1418 96 %     Weight 07/16/20 1425 176 lb 5.9 oz (80 kg)     Height 07/16/20 1425 5\' 5"  (1.651 m)     Head Circumference --      Peak Flow --      Pain Score 07/16/20 1419 5     Pain Loc --      Pain Edu? --      Excl. in [place]? --     Vital signs reviewed, nursing assessments reviewed.   Constitutional:   Alert and oriented. Non-toxic appearance. Eyes:   Conjunctivae are normal. EOMI. PERRL. ENT      Head:   Normocephalic and atraumatic.      Nose:   Normal.      Mouth/Throat: Moist mucous membranes, no pharyngeal erythema.      Neck:   No meningismus. Full ROM. Hematological/Lymphatic/Immunilogical:   No cervical lymphadenopathy. Cardiovascular:   RRR. Symmetric bilateral radial and DP pulses.  No murmurs. Cap refill less than 2 seconds. Respiratory:   Normal respiratory effort without tachypnea/retractions. Breath sounds are clear and equal bilaterally. No wheezes/rales/rhonchi. Gastrointestinal:   Soft and nontender. Non distended. There is no CVA  tenderness.  No rebound, rigidity, or guarding.  Musculoskeletal:   Normal range of motion in all extremities. No joint effusions.  No lower extremity tenderness.  No edema. Neurologic:   Normal speech and language.  Motor grossly intact. No acute focal neurologic deficits are appreciated.  Skin:    Skin is warm, dry and intact. No rash noted.  No petechiae, purpura, or bullae.  ____________________________________________    LABS (pertinent positives/negatives) (all labs ordered are listed, but only abnormal results are displayed) Labs Reviewed  COMPREHENSIVE METABOLIC PANEL - Abnormal; Notable for the following components:      Result Value   Sodium 134 (*)    Glucose, Bld 281 (*)    Creatinine, Ser 1.48 (*)    Calcium 8.4 (*)    AST 42 (*)    GFR calc non Af Amer 45 (*)    GFR calc Af Amer 52 (*)    All other components within normal limits  LIPASE, BLOOD  CBC WITH DIFFERENTIAL/PLATELET  TROPONIN I (HIGH SENSITIVITY)   ____________________________________________   EKG  Interpreted by me Normal sinus rhythm rate of 82, normal axis and intervals.  Poor R wave progression.  Normal ST segments and T waves.  No acute ischemic changes.  ____________________________________________    RADIOLOGY  No results found.  ____________________________________________   PROCEDURES Procedures  ____________________________________________  DIFFERENTIAL DIAGNOSIS   Stable angina pectoris, non-STEMI, GERD, anxiety, heat related vasodilation  CLINICAL IMPRESSION / ASSESSMENT AND PLAN / ED COURSE  Medications ordered in the ED: Medications  nitroGLYCERIN (NITROSTAT) SL tablet 0.4 mg (has no administration in time range)  sodium chloride 0.9 % bolus 500 mL (500 mLs Intravenous New Bag/Given 07/16/20 1415)  acetaminophen (TYLENOL) tablet 1,000 mg (1,000 mg Oral Given 07/16/20 1412)  LORazepam (ATIVAN) injection 1 mg (1 mg Intravenous Given 07/16/20 1504)    Pertinent labs &  imaging results that were available during my care of the patient were reviewed by me and considered in my medical decision making (see chart for details).  Toula Moos  D Blatz was evaluated in Emergency Department on 07/16/2020 for the symptoms described in the history of present illness. He was evaluated in the context of the global COVID-19 pandemic, which necessitated consideration that the patient might be at risk for infection with the SARS-CoV-2 virus that causes COVID-19. Institutional protocols and algorithms that pertain to the evaluation of patients at risk for COVID-19 are in a state of rapid change based on information released by regulatory bodies including the CDC and federal and state organizations. These policies and algorithms were followed during the patient's care in the ED.   Patient presents with precordial pain, still 5/10 on arrival despite nitroglycerin and Xanax at home and aspirin by EMS.  EKG does not show any acute changes.  Will check troponin and reassess, give additional nitroglycerin here in the ED.  If still having pain, he will need hospitalization for further cardiac work-up and monitoring.  He reports his doctor took him off of metformin, but EMS reports a blood sugar of 300.If labs confirm this today, he will need reinitiation of glycemic control measures.  ----------------------------------------- 2:45 PM on 07/16/2020 -----------------------------------------  Still awaiting labs.  Patient now very anxious, shaking arms bilaterally.  I will give him additional IV Ativan.  Still having 5/10 chest pain, will need to plan on hospitalization for further cardiac work-up.      ____________________________________________   FINAL CLINICAL IMPRESSION(S) / ED DIAGNOSES    Final diagnoses:  Precordial pain  Chest pain with moderate risk for cardiac etiology     ED Discharge Orders    None      Portions of this note were generated with dragon dictation  software. Dictation errors may occur despite best attempts at proofreading.   Carrie Mew, MD 07/16/20 [phone]

## 2020-07-17 ENCOUNTER — Encounter: Payer: Self-pay | Admitting: Internal Medicine

## 2020-07-17 DIAGNOSIS — R072 Precordial pain: Principal | ICD-10-CM

## 2020-07-17 LAB — HEPARIN LEVEL (UNFRACTIONATED): Heparin Unfractionated: 0.43 IU/mL (ref 0.30–0.70)

## 2020-07-17 LAB — GLUCOSE, CAPILLARY: Glucose-Capillary: 136 mg/dL — ABNORMAL HIGH (ref 70–99)

## 2020-07-17 NOTE — ED Notes (Signed)
Discharge instructions given to patient. Verbalized understanding. No acute distress at this time. Will transport patient to waiting room for wife to pick up.

## 2020-07-17 NOTE — Discharge Summary (Signed)
Physician Discharge Summary  [name] WPY:[id number] DOB: [birth year] DOA: 07/16/2020  PCP: [name], MD  Admit date: 07/16/2020 Discharge date: 07/17/2020  Discharge disposition: Home   Recommendations for Outpatient Follow-Up:   Follow up with cardiologist, Dr. Saralyn Pilar as an outpatient  Discharge Diagnosis:   Principal Problem:   Chest pain    Discharge Condition: Stable.  Diet recommendation:  Diet Order            Diet - low sodium heart healthy                   Code Status: Full code     Hospital Course:   [name] is a 79 y.o. male with medical history significant for anxiety disorder, diabetes mellitus, hypertension and coronary artery disease who presented to the ER for evaluation of midsternal chest pain.  He recently had a negative myocardial perfusion nuclear stress test on June 2021.  EKG did not show any acute abnormality and his troponins were negative in the ED.  He was evaluated by the cardiologist, Dr. Ubaldo Glassing, who recommended outpatient follow-up with cardiology.  His chest pain has resolved.  He is feeling better and deemed stable for discharge.     Discharge Exam:   Vitals:   07/17/20 0300 07/17/20 0500  BP: 125/81 (!) 129/82  Pulse: 78 82  Resp: 16 16  Temp:    SpO2: 96% 95%   Vitals:   07/16/20 2100 07/16/20 2300 07/17/20 0300 07/17/20 0500  BP:  (!) 104/64 125/81 (!) 129/82  Pulse:  76 78 82  Resp:  16 16 16   Temp: 98 F (36.7 C)     TempSrc:      SpO2:   96% 95%  Weight:      Height:         GEN: NAD SKIN: Warm and dry EYES: EOMI ENT: MMM CV: RRR PULM: CTA B ABD: soft, ND, NT, +BS CNS: AAO x 3, non focal EXT: No edema or tenderness   The results of significant diagnostics from this hospitalization (including imaging, microbiology, ancillary and laboratory) are listed below for reference.     Procedures and Diagnostic Studies:   DG Chest 2 View  Result Date: 07/16/2020 CLINICAL DATA:   Chest pressure, near syncope EXAM: CHEST - 2 VIEW COMPARISON:  Radiograph 06/01/2020, CT 06/02/2020 FINDINGS: Low volumes with atelectatic change in mild bronchitic features similar to the comparison study. Small calcified granuloma in the left lung base unchanged from prior. No consolidation, features of edema, pneumothorax, or effusion. The aorta is calcified. The remaining cardiomediastinal contours are unremarkable. No acute osseous or soft tissue abnormality. Degenerative changes are present in the imaged spine and shoulders. Telemetry leads overlie the chest. IMPRESSION: Basilar atelectasis without other acute cardiopulmonary abnormality. Mild stable bronchitic changes. Electronically Signed   By: Lovena Le M.D.   On: 07/16/2020 18:17     Labs:   Basic Metabolic Panel: Recent Labs  Lab 07/16/20 1408  NA 134*  K 3.5  CL 98  CO2 25  GLUCOSE 281*  BUN 15  CREATININE 1.48*  CALCIUM 8.4*   GFR Estimated Creatinine Clearance: 40.7 mL/min (A) (by C-G formula based on SCr of 1.48 mg/dL (H)). Liver Function Tests: Recent Labs  Lab 07/16/20 1408  AST 42*  ALT 27  ALKPHOS 72  BILITOT 0.9  PROT 6.5  ALBUMIN 3.7   Recent Labs  Lab 07/16/20 1408  LIPASE 44   No results  for input(s): AMMONIA in the last 168 hours. Coagulation profile No results for input(s): INR, PROTIME in the last 168 hours.  CBC: Recent Labs  Lab 07/16/20 1408  WBC 4.5  NEUTROABS 2.8  HGB 13.9  HCT 39.9  MCV 92.1  PLT 218   Cardiac Enzymes: No results for input(s): CKTOTAL, CKMB, CKMBINDEX, TROPONINI in the last 168 hours. BNP: Invalid input(s): POCBNP CBG: Recent Labs  Lab 07/16/20 2117 07/17/20 0819  GLUCAP 141* 136*   D-Dimer No results for input(s): DDIMER in the last 72 hours. Hgb A1c Recent Labs    07/16/20 1408  HGBA1C 7.1*   Lipid Profile Recent Labs    07/16/20 2028  CHOL 179  HDL 65  LDLCALC 87  TRIG 133  CHOLHDL 2.8   Thyroid function studies No results for  input(s): TSH, T4TOTAL, T3FREE, THYROIDAB in the last 72 hours.  Invalid input(s): FREET3 Anemia work up No results for input(s): VITAMINB12, FOLATE, FERRITIN, TIBC, IRON, RETICCTPCT in the last 72 hours. Microbiology Recent Results (from the past 240 hour(s))  SARS Coronavirus 2 by RT PCR (hospital order, performed in [facility] hospital lab) Nasopharyngeal Nasopharyngeal Swab     Status: None   Collection Time: 07/16/20  3:13 PM   Specimen: Nasopharyngeal Swab  Result Value Ref Range Status   SARS Coronavirus 2 NEGATIVE NEGATIVE Final    Comment: (NOTE) SARS-CoV-2 target nucleic acids are NOT DETECTED.  The SARS-CoV-2 RNA is generally detectable in upper and lower respiratory specimens during the acute phase of infection. The lowest concentration of SARS-CoV-2 viral copies this assay can detect is 250 copies / mL. A negative result does not preclude SARS-CoV-2 infection and should not be used as the sole basis for treatment or other patient management decisions.  A negative result may occur with improper specimen collection / handling, submission of specimen other than nasopharyngeal swab, presence of viral mutation(s) within the areas targeted by this assay, and inadequate number of viral copies (<250 copies / mL). A negative result must be combined with clinical observations, patient history, and epidemiological information.  Fact Sheet for Patients:   StrictlyIdeas.no  Fact Sheet for Healthcare Providers: BankingDealers.co.za  This test is not yet approved or  cleared by the [place] FDA and has been authorized for detection and/or diagnosis of SARS-CoV-2 by FDA under an Emergency Use Authorization (EUA).  This EUA will remain in effect (meaning this test can be used) for the duration of the COVID-19 declaration under Section 564(b)(1) of the Act, 21 U.S.C. section 360bbb-3(b)(1), unless the authorization is terminated  or revoked sooner.  Performed at [facility], [address] [place], [place] [address]      Discharge Instructions:   Discharge Instructions    Diet - low sodium heart healthy   Complete by: As directed    Increase activity slowly   Complete by: As directed      Allergies as of 07/17/2020      Reactions   Ace Inhibitors Cough   Metoprolol Diarrhea   Sulfa Antibiotics Rash      Medication List    STOP taking these medications   ascorbic acid 1000 MG tablet Commonly known as: VITAMIN C   esomeprazole 40 MG capsule Commonly known as: NEXIUM   glimepiride 1 MG tablet Commonly known as: AMARYL     TAKE these medications   albuterol 1.25 MG/3ML nebulizer solution Commonly known as: ACCUNEB TAKE 3 MLS (1.25 MG TOTAL) BY NEBULIZATION EVERY 6 (SIX) HOURS  AS NEEDED FOR WHEEZING   ALPRAZolam 0.5 MG tablet Commonly known as: XANAX Take 0.5 tablets by mouth daily as needed.   amitriptyline 10 MG tablet Commonly known as: ELAVIL Take 10 mg by mouth at bedtime.   aspirin 81 MG chewable tablet Chew 81 mg by mouth daily.   bisoprolol 5 MG tablet Commonly known as: ZEBETA Take 2.5 mg by mouth 2 (two) times daily.   diltiazem 180 MG 24 hr capsule Commonly known as: CARDIZEM CD Take 1 capsule by mouth 2 (two) times a day.   donepezil 5 MG tablet Commonly known as: ARICEPT Take 5 mg by mouth at bedtime.   famotidine 40 MG tablet Commonly known as: PEPCID Take 40 mg by mouth at bedtime.   fluticasone 50 MCG/ACT nasal spray Commonly known as: FLONASE Place 1 spray into both nostrils daily as needed for allergies or rhinitis.   glucosamine-chondroitin 500-400 MG tablet Take 1 tablet by mouth daily.   ibuprofen 200 MG tablet Commonly known as: ADVIL Take 200 mg by mouth every 6 (six) hours as needed.   isosorbide mononitrate 30 MG 24 hr tablet Commonly known as: IMDUR Take 30 mg by mouth daily.   montelukast 10 MG tablet Commonly known as:  SINGULAIR Take 10 mg by mouth at bedtime.   MULTIVITAMIN PO Take 1 tablet by mouth daily.   nitroGLYCERIN 0.4 MG SL tablet Commonly known as: NITROSTAT Place 0.4 mg under the tongue every 5 (five) minutes as needed for chest pain.   omeprazole 40 MG capsule Commonly known as: PRILOSEC Take 40 mg by mouth daily.   simvastatin 20 MG tablet Commonly known as: ZOCOR Take 20 mg by mouth daily.   Symbicort 80-4.5 MCG/ACT inhaler Generic drug: budesonide-formoterol Inhale 2 puffs into the lungs 2 (two) times daily as needed.   triamterene-hydrochlorothiazide 37.5-25 MG capsule Commonly known as: DYAZIDE Take 1 capsule by mouth daily.       Follow-up Information    Paraschos, Alexander, MD. Schedule an appointment as soon as possible for a visit in 10 day(s).   Specialty: Cardiology Contact information: [facility] [place] [phone]                Time coordinating discharge: 27 minutes  Signed:  Jennye Boroughs  [facility] 07/17/2020, 8:49 PM

## 2020-07-17 NOTE — Discharge Instructions (Signed)

## 2020-07-17 NOTE — Progress Notes (Signed)
[facility] Cardiology    SUBJECTIVE: Mr. Spadafore is a 79 year old male with a past medical history significant for coronary artery disease, diagnosed by cardiac catheterization in 2017 and treated medically, type 2 diabetes, COPD, obstructive sleep apnea, hyperlipidemia, and hypertension who presented to the ED on 07/16/20 for an acute onset of chest pain, radiating to his throat, with associated shortness of breath and diaphoresis.  Workup in the ED included an ECG revealing no evidence of acute ischemia, high sensitivity troponin of 5 and 6 respectively, chest xray negative for acute cardiopulmonary disease, and creatinine of 1.48.  He is followed in outpatient cardiology by Dr. Saralyn Pilar.  Most recent stress test on 06/10/20 revealed normal LV systolic function, an EF of 61%, with no evidence of ischemia. Echocardiogram on 03/15/19 revealed normal RV and LV systolic function with an EF estimated greater than 55% with mild AR, mild MR, and mild TR. Cardiac catheterization in 2017 revealed a proximal LAD lesion, 50% stenosed, with a normal RCA and LCx.   07/17/20: Mr. Bunch currently denies chest pain, palpitations, shortness of breath, lower extremity swelling, orthopnea, PND, or syncopal/presyncopal episodes.    Vitals:   07/16/20 2100 07/16/20 2300 07/17/20 0300 07/17/20 0500  BP:  (!) 104/64 125/81 (!) 129/82  Pulse:  76 78 82  Resp:  16 16 16   Temp: 98 F (36.7 C)     TempSrc:      SpO2:   96% 95%  Weight:      Height:         Intake/Output Summary (Last 24 hours) at 07/17/2020 0809 Last data filed at 07/17/2020 0540 Gross per 24 hour  Intake --  Output 700 ml  Net -700 ml      PHYSICAL EXAM  General: Well developed, well nourished, in no acute distress HEENT:  Normocephalic and atramatic Neck:  No JVD.  Lungs: Clear bilaterally to auscultation and percussion. Heart: HRRR . Normal S1 and S2 without gallops or murmurs.  Abdomen: Bowel sounds are positive, abdomen soft and non-tender   Msk:  Back normal.  Normal strength and tone for age. Extremities: No clubbing, cyanosis or edema.   Neuro: Alert and oriented X 3. Psych:  Good affect, responds appropriately   LABS: Basic Metabolic Panel: Recent Labs    07/16/20 1408  NA 134*  K 3.5  CL 98  CO2 25  GLUCOSE 281*  BUN 15  CREATININE 1.48*  CALCIUM 8.4*   Liver Function Tests: Recent Labs    07/16/20 1408  AST 42*  ALT 27  ALKPHOS 72  BILITOT 0.9  PROT 6.5  ALBUMIN 3.7   Recent Labs    07/16/20 1408  LIPASE 44   CBC: Recent Labs    07/16/20 1408  WBC 4.5  NEUTROABS 2.8  HGB 13.9  HCT 39.9  MCV 92.1  PLT 218   Cardiac Enzymes: No results for input(s): CKTOTAL, CKMB, CKMBINDEX, TROPONINI in the last 72 hours. BNP: Invalid input(s): POCBNP D-Dimer: No results for input(s): DDIMER in the last 72 hours. Hemoglobin A1C: Recent Labs    07/16/20 1408  HGBA1C 7.1*   Fasting Lipid Panel: Recent Labs    07/16/20 2028  CHOL 179  HDL 65  LDLCALC 87  TRIG 133  CHOLHDL 2.8   Thyroid Function Tests: No results for input(s): TSH, T4TOTAL, T3FREE, THYROIDAB in the last 72 hours.  Invalid input(s): FREET3 Anemia Panel: No results for input(s): VITAMINB12, FOLATE, FERRITIN, TIBC, IRON, RETICCTPCT in the last 72 hours.  DG Chest 2 View  Result Date: 07/16/2020 CLINICAL DATA:  Chest pressure, near syncope EXAM: CHEST - 2 VIEW COMPARISON:  Radiograph 06/01/2020, CT 06/02/2020 FINDINGS: Low volumes with atelectatic change in mild bronchitic features similar to the comparison study. Small calcified granuloma in the left lung base unchanged from prior. No consolidation, features of edema, pneumothorax, or effusion. The aorta is calcified. The remaining cardiomediastinal contours are unremarkable. No acute osseous or soft tissue abnormality. Degenerative changes are present in the imaged spine and shoulders. Telemetry leads overlie the chest. IMPRESSION: Basilar atelectasis without other acute  cardiopulmonary abnormality. Mild stable bronchitic changes. Electronically Signed   By: Lovena Le M.D.   On: 07/16/2020 18:17     TELEMETRY: Normal sinus rhythm   ASSESSMENT AND PLAN:  Principal Problem:   Chest pain    1.  Chest pain   -Ruled out for ACS with troponin 5, 6 respectively   -Most recent ETT Myoview in June of this year revealed no evidence of ischemia with normal LV systolic function   -No further workup indicated from a cardiac standpoint; would recommend discharge with f/u with Dr. Saralyn Pilar or Clabe Seal within 7-10 days   2.  History of coronary artery disease   -Would recommend continuing home medications of aspirin 81mg  daily, bisoprolol 2.5mg  BID, Imdur 30mg  daily, and simvastatin 20mg  daily    The history, physical exam findings, and plan of care were all discussed with Dr. Bartholome Bill, and all decision making was made in collaboration.   Avie Arenas  PA-C 07/17/2020 8:09 AM

## 2020-07-20 ENCOUNTER — Encounter: Payer: Medicare Other | Admitting: *Deleted

## 2020-07-20 ENCOUNTER — Other Ambulatory Visit: Payer: Self-pay

## 2020-07-20 DIAGNOSIS — I208 Other forms of angina pectoris: Secondary | ICD-10-CM | POA: Diagnosis not present

## 2020-07-20 NOTE — Progress Notes (Signed)
Daily Session Note  Patient Details  Name: Brett Blake MRN: [id number] Date of Birth: [birth year] Referring Provider:     Cardiac Rehab from 06/08/2020 in [facility] Cardiac and Pulmonary Rehab  Referring Provider [name]      Encounter Date: 07/20/2020  Check In:  Session Check In - 07/20/20 1557      Check-In   Supervising physician immediately available to respond to emergencies See telemetry face sheet for immediately available ER MD    Location [facility]-Cardiac & Pulmonary Rehab    Staff Present Renita Papa, RN Moises Blood, BS, ACSM CEP, Exercise Physiologist;Amanda Oletta Darter, [place], ACSM CEP, Exercise Physiologist    Virtual Visit No    Medication changes reported     No    Fall or balance concerns reported    No    Warm-up and Cool-down Performed on first and last piece of equipment    Resistance Training Performed Yes    VAD Patient? No    PAD/SET Patient? No      Pain Assessment   Currently in Pain? No/denies              Social History   Tobacco Use  Smoking Status Former Smoker  . Packs/day: 1.00  . Years: 15.00  . Pack years: 15.00  . Types: Cigarettes  Smokeless Tobacco Never Used  Tobacco Comment   quit 1977    Goals Met:  Independence with exercise equipment Exercise tolerated well No report of cardiac concerns or symptoms Strength training completed today  Goals Unmet:  Not Applicable  Comments: Pt able to follow exercise prescription today without complaint.  Will continue to monitor for progression.    Dr. Emily Filbert is Medical Director for [place] and [facility].

## 2020-07-22 ENCOUNTER — Encounter: Payer: Medicare Other | Admitting: *Deleted

## 2020-07-22 ENCOUNTER — Other Ambulatory Visit: Payer: Self-pay

## 2020-07-22 DIAGNOSIS — I208 Other forms of angina pectoris: Secondary | ICD-10-CM

## 2020-07-22 NOTE — Progress Notes (Signed)
Daily Session Note  Patient Details  Name: OSEIAS HORSEY MRN: [id number] Date of Birth: 23-Nov-1941 Referring Provider:     Cardiac Rehab from 06/08/2020 in [facility] Cardiac and Pulmonary Rehab  Referring Provider [name]      Encounter Date: 07/22/2020  Check In:  Session Check In - 07/22/20 1603      Check-In   Supervising physician immediately available to respond to emergencies See telemetry face sheet for immediately available ER MD    Location [facility]-Cardiac & Pulmonary Rehab    Staff Present Renita Papa, RN BSN;Joseph Hood RCP,RRT,BSRT;Laureen Owens Shark, [place], RRT, CPFT;Amanda Oletta Darter, [place], ACSM CEP, Exercise Physiologist    Virtual Visit No    Medication changes reported     No    Fall or balance concerns reported    No    Warm-up and Cool-down Performed on first and last piece of equipment    Resistance Training Performed Yes    VAD Patient? No    PAD/SET Patient? No      Pain Assessment   Currently in Pain? No/denies              Social History   Tobacco Use  Smoking Status Former Smoker  . Packs/day: 1.00  . Years: 15.00  . Pack years: 15.00  . Types: Cigarettes  Smokeless Tobacco Never Used  Tobacco Comment   quit 1977    Goals Met:  Independence with exercise equipment Exercise tolerated well No report of cardiac concerns or symptoms Strength training completed today  Goals Unmet:  Not Applicable  Comments: Pt able to follow exercise prescription today without complaint.  Will continue to monitor for progression.    Dr. Emily Filbert is Medical Director for [place] and [facility].

## 2020-07-23 ENCOUNTER — Encounter: Payer: Medicare Other | Admitting: *Deleted

## 2020-07-23 DIAGNOSIS — I208 Other forms of angina pectoris: Secondary | ICD-10-CM | POA: Diagnosis not present

## 2020-07-23 NOTE — Progress Notes (Signed)
Daily Session Note  Patient Details  Name: Brett Blake MRN: [id number] Date of Birth: 09-Feb-1941 Referring Provider:     Cardiac Rehab from 06/08/2020 in [facility] Cardiac and Pulmonary Rehab  Referring Provider [name]      Encounter Date: 07/23/2020  Check In:  Session Check In - 07/23/20 1601      Check-In   Supervising physician immediately available to respond to emergencies See telemetry face sheet for immediately available ER MD    Location [facility]-Cardiac & Pulmonary Rehab    Staff Present Renita Papa, RN BSN;Joseph [address] [place], [place], ACSM CEP, Exercise Physiologist;Amanda Oletta Darter, [place], ACSM CEP, Exercise Physiologist    Virtual Visit No    Medication changes reported     No    Fall or balance concerns reported    No    Warm-up and Cool-down Performed on first and last piece of equipment    Resistance Training Performed Yes    VAD Patient? No    PAD/SET Patient? No      Pain Assessment   Currently in Pain? No/denies              Social History   Tobacco Use  Smoking Status Former Smoker  . Packs/day: 1.00  . Years: 15.00  . Pack years: 15.00  . Types: Cigarettes  Smokeless Tobacco Never Used  Tobacco Comment   quit 1977    Goals Met:  Independence with exercise equipment Exercise tolerated well No report of cardiac concerns or symptoms Strength training completed today  Goals Unmet:  Not Applicable  Comments: Pt able to follow exercise prescription today without complaint.  Will continue to monitor for progression.    Dr. Emily Filbert is Medical Director for [place] and [facility].

## 2020-07-27 ENCOUNTER — Other Ambulatory Visit: Payer: Self-pay

## 2020-07-27 ENCOUNTER — Encounter: Payer: Medicare Other | Attending: Internal Medicine | Admitting: *Deleted

## 2020-07-27 DIAGNOSIS — F329 Major depressive disorder, single episode, unspecified: Secondary | ICD-10-CM | POA: Diagnosis not present

## 2020-07-27 DIAGNOSIS — Z79899 Other long term (current) drug therapy: Secondary | ICD-10-CM | POA: Insufficient documentation

## 2020-07-27 DIAGNOSIS — I10 Essential (primary) hypertension: Secondary | ICD-10-CM | POA: Insufficient documentation

## 2020-07-27 DIAGNOSIS — G4730 Sleep apnea, unspecified: Secondary | ICD-10-CM | POA: Diagnosis not present

## 2020-07-27 DIAGNOSIS — M1990 Unspecified osteoarthritis, unspecified site: Secondary | ICD-10-CM | POA: Diagnosis not present

## 2020-07-27 DIAGNOSIS — F419 Anxiety disorder, unspecified: Secondary | ICD-10-CM | POA: Insufficient documentation

## 2020-07-27 DIAGNOSIS — I208 Other forms of angina pectoris: Secondary | ICD-10-CM | POA: Insufficient documentation

## 2020-07-27 DIAGNOSIS — Z7982 Long term (current) use of aspirin: Secondary | ICD-10-CM | POA: Diagnosis not present

## 2020-07-27 DIAGNOSIS — K219 Gastro-esophageal reflux disease without esophagitis: Secondary | ICD-10-CM | POA: Insufficient documentation

## 2020-07-27 DIAGNOSIS — E119 Type 2 diabetes mellitus without complications: Secondary | ICD-10-CM | POA: Diagnosis not present

## 2020-07-27 DIAGNOSIS — Z7984 Long term (current) use of oral hypoglycemic drugs: Secondary | ICD-10-CM | POA: Diagnosis not present

## 2020-07-27 DIAGNOSIS — E785 Hyperlipidemia, unspecified: Secondary | ICD-10-CM | POA: Diagnosis not present

## 2020-07-27 DIAGNOSIS — J449 Chronic obstructive pulmonary disease, unspecified: Secondary | ICD-10-CM | POA: Insufficient documentation

## 2020-07-27 DIAGNOSIS — Z87891 Personal history of nicotine dependence: Secondary | ICD-10-CM | POA: Insufficient documentation

## 2020-07-27 NOTE — Progress Notes (Signed)
Daily Session Note  Patient Details  Name: LORENCE NAGENGAST MRN: [id number] Date of Birth: Nov 23, 1941 Referring Provider:     Cardiac Rehab from 06/08/2020 in [facility] Cardiac and Pulmonary Rehab  Referring Provider [name]      Encounter Date: 07/27/2020  Check In:  Session Check In - 07/27/20 1553      Check-In   Supervising physician immediately available to respond to emergencies See telemetry face sheet for immediately available ER MD    Location [facility]-Cardiac & Pulmonary Rehab    Staff Present Renita Papa, RN Margurite Auerbach, MS Exercise Physiologist;Kelly Amedeo Plenty, BS, ACSM CEP, Exercise Physiologist    Virtual Visit No    Medication changes reported     No    Fall or balance concerns reported    No    Warm-up and Cool-down Performed on first and last piece of equipment    Resistance Training Performed Yes    VAD Patient? No    PAD/SET Patient? No      Pain Assessment   Currently in Pain? No/denies              Social History   Tobacco Use  Smoking Status Former Smoker  . Packs/day: 1.00  . Years: 15.00  . Pack years: 15.00  . Types: Cigarettes  Smokeless Tobacco Never Used  Tobacco Comment   quit 1977    Goals Met:  Independence with exercise equipment Exercise tolerated well No report of cardiac concerns or symptoms Strength training completed today  Goals Unmet:  Not Applicable  Comments: Pt able to follow exercise prescription today without complaint.  Will continue to monitor for progression.    Dr. Emily Filbert is Medical Director for [place] and [facility].

## 2020-07-29 ENCOUNTER — Encounter: Payer: Medicare Other | Admitting: *Deleted

## 2020-07-29 ENCOUNTER — Other Ambulatory Visit: Payer: Self-pay

## 2020-07-29 DIAGNOSIS — I208 Other forms of angina pectoris: Secondary | ICD-10-CM

## 2020-07-29 NOTE — Progress Notes (Signed)
Daily Session Note  Patient Details  Name: Brett Blake MRN: [id number] Date of Birth: November 26, 1941 Referring Provider:     Cardiac Rehab from 06/08/2020 in [facility] Cardiac and Pulmonary Rehab  Referring Provider [name]      Encounter Date: 07/29/2020  Check In:  Session Check In - 07/29/20 1601      Check-In   Supervising physician immediately available to respond to emergencies See telemetry face sheet for immediately available ER MD    Location [facility]-Cardiac & Pulmonary Rehab    Staff Present Renita Papa, RN BSN;Joseph Lou [place], [place] Exercise Physiologist;Amanda Oletta Darter, [place], ACSM CEP, Exercise Physiologist    Virtual Visit No    Medication changes reported     No    Fall or balance concerns reported    No    Warm-up and Cool-down Performed on first and last piece of equipment    Resistance Training Performed Yes    VAD Patient? No    PAD/SET Patient? No      Pain Assessment   Currently in Pain? No/denies              Social History   Tobacco Use  Smoking Status Former Smoker  . Packs/day: 1.00  . Years: 15.00  . Pack years: 15.00  . Types: Cigarettes  Smokeless Tobacco Never Used  Tobacco Comment   quit 1977    Goals Met:  Independence with exercise equipment Exercise tolerated well No report of cardiac concerns or symptoms Strength training completed today  Goals Unmet:  Not Applicable  Comments: Pt able to follow exercise prescription today without complaint.  Will continue to monitor for progression.    Dr. Emily Filbert is Medical Director for [place] and [facility].

## 2020-07-30 ENCOUNTER — Other Ambulatory Visit: Payer: Self-pay

## 2020-07-30 ENCOUNTER — Encounter: Payer: Medicare Other | Admitting: *Deleted

## 2020-07-30 DIAGNOSIS — I208 Other forms of angina pectoris: Secondary | ICD-10-CM | POA: Diagnosis not present

## 2020-07-30 NOTE — Progress Notes (Signed)
Daily Session Note  Patient Details  Name: Brett Blake MRN: [id number] Date of Birth: [birth year] Referring Provider:     Cardiac Rehab from 06/08/2020 in [facility] Cardiac and Pulmonary Rehab  Referring Provider [name]      Encounter Date: 07/30/2020  Check In:  Session Check In - 07/30/20 1607      Check-In   Supervising physician immediately available to respond to emergencies See telemetry face sheet for immediately available ER MD    Location [facility]-Cardiac & Pulmonary Rehab    Staff Present Justin Mend RCP,RRT,BSRT;Verneal Wiers Sherryll Burger, RN BSN;Laureen Owens Shark, BS, RRT, CPFT    Virtual Visit No    Medication changes reported     No    Fall or balance concerns reported    No    Warm-up and Cool-down Performed on first and last piece of equipment    Resistance Training Performed Yes    VAD Patient? No    PAD/SET Patient? No      Pain Assessment   Currently in Pain? No/denies              Social History   Tobacco Use  Smoking Status Former Smoker  . Packs/day: 1.00  . Years: 15.00  . Pack years: 15.00  . Types: Cigarettes  Smokeless Tobacco Never Used  Tobacco Comment   quit 1977    Goals Met:  Independence with exercise equipment Exercise tolerated well No report of cardiac concerns or symptoms Strength training completed today  Goals Unmet:  Not Applicable  Comments: Pt able to follow exercise prescription today without complaint.  Will continue to monitor for progression.    Dr. Emily Filbert is Medical Director for [place] and [facility].

## 2020-08-05 ENCOUNTER — Encounter: Payer: Self-pay | Admitting: *Deleted

## 2020-08-05 DIAGNOSIS — I208 Other forms of angina pectoris: Secondary | ICD-10-CM

## 2020-08-05 NOTE — Progress Notes (Signed)
Cardiac Individual Treatment Plan  Patient Details  Name: Brett Blake MRN: [id number] Date of Birth: Nov 21, 1941 Referring Provider:     Cardiac Rehab from 06/08/2020 in [facility] Cardiac and Pulmonary Rehab  Referring Provider [name]      Initial Encounter Date:    Cardiac Rehab from 06/08/2020 in [facility] Cardiac and Pulmonary Rehab  Date 06/08/20      Visit Diagnosis: Chronic stable angina ([place])  Patient's Home Medications on Admission:  Current Outpatient Medications:  .  albuterol (ACCUNEB) 1.25 MG/3ML nebulizer solution, TAKE 3 MLS (1.25 MG TOTAL) BY NEBULIZATION EVERY 6 (SIX) HOURS AS NEEDED FOR WHEEZING, Disp: , Rfl:  .  ALPRAZolam (XANAX) 0.5 MG tablet, Take 0.5 tablets by mouth daily as needed., Disp: , Rfl:  .  amitriptyline (ELAVIL) 10 MG tablet, Take 10 mg by mouth at bedtime., Disp: , Rfl:  .  aspirin 81 MG chewable tablet, Chew 81 mg by mouth daily., Disp: , Rfl:  .  bisoprolol (ZEBETA) 5 MG tablet, Take 2.5 mg by mouth 2 (two) times daily., Disp: , Rfl:  .  diltiazem (CARDIZEM CD) 180 MG 24 hr capsule, Take 1 capsule by mouth 2 (two) times a day., Disp: , Rfl:  .  donepezil (ARICEPT) 5 MG tablet, Take 5 mg by mouth at bedtime. , Disp: , Rfl:  .  famotidine (PEPCID) 40 MG tablet, Take 40 mg by mouth at bedtime., Disp: , Rfl:  .  fluticasone (FLONASE) 50 MCG/ACT nasal spray, Place 1 spray into both nostrils daily as needed for allergies or rhinitis., Disp: , Rfl:  .  glucosamine-chondroitin 500-400 MG tablet, Take 1 tablet by mouth daily., Disp: , Rfl:  .  ibuprofen (ADVIL,MOTRIN) 200 MG tablet, Take 200 mg by mouth every 6 (six) hours as needed., Disp: , Rfl:  .  isosorbide mononitrate (IMDUR) 30 MG 24 hr tablet, Take 30 mg by mouth daily., Disp: , Rfl:  .  montelukast (SINGULAIR) 10 MG tablet, Take 10 mg by mouth at bedtime., Disp: , Rfl:  .  Multiple Vitamins-Minerals (MULTIVITAMIN PO), Take 1 tablet by mouth daily., Disp: , Rfl:  .  nitroGLYCERIN (NITROSTAT) 0.4 MG SL  tablet, Place 0.4 mg under the tongue every 5 (five) minutes as needed for chest pain., Disp: , Rfl:  .  omeprazole (PRILOSEC) 40 MG capsule, Take 40 mg by mouth daily., Disp: , Rfl:  .  simvastatin (ZOCOR) 20 MG tablet, Take 20 mg by mouth daily., Disp: , Rfl:  .  SYMBICORT 80-4.5 MCG/ACT inhaler, Inhale 2 puffs into the lungs 2 (two) times daily as needed. , Disp: , Rfl:  .  triamterene-hydrochlorothiazide (DYAZIDE) 37.5-25 MG per capsule, Take 1 capsule by mouth daily., Disp: , Rfl:   Past Medical History: Past Medical History:  Diagnosis Date  . Anxiety   . Arthritis   . Asthma   . Colon polyps   . COPD (chronic obstructive pulmonary disease) ([place])   . Depression   . Diabetes ([place])   . Difficult intubation   . GERD (gastroesophageal reflux disease)   . High cholesterol   . Hyperlipidemia   . Hypertension   . Sleep apnea     Tobacco Use: Social History   Tobacco Use  Smoking Status Former Smoker  . Packs/day: 1.00  . Years: 15.00  . Pack years: 15.00  . Types: Cigarettes  Smokeless Tobacco Never Used  Tobacco Comment   quit 1977    Labs: Recent Review Flowsheet Data    Labs for  ITP Cardiac and Pulmonary Rehab Latest Ref Rng & Units 06/26/2015 06/27/2015 05/08/2019 07/16/2020   Cholestrol 0 - 200 mg/dL - - - 179   LDLCALC 0 - 99 mg/dL - - - 87   HDL >40 mg/dL - - - 65   Trlycerides <150 mg/dL - - - 133   Hemoglobin A1c 4.8 - 5.6 % 5.9 6.1(H) 5.9(H) 7.1(H)       Exercise Target Goals: Exercise Program Goal: Individual exercise prescription set using results from initial 6 min walk test and THRR while considering  patient's activity barriers and safety.   Exercise Prescription Goal: Initial exercise prescription builds to 30-45 minutes a day of aerobic activity, 2-3 days per week.  Home exercise guidelines will be given to patient during program as part of exercise prescription that the participant will acknowledge.   Education: Aerobic Exercise & Resistance  Training: - Gives group verbal and written instruction on the various components of exercise. Focuses on aerobic and resistive training programs and the benefits of this training and how to safely progress through these programs..   Cardiac Rehab from 07/17/2017 in [facility] Cardiac and Pulmonary Rehab  Date 07/05/17  Educator AS  Instruction Review Code (retired) 2- meets goals/outcomes      Education: Exercise & Equipment Safety: - Individual verbal instruction and demonstration of equipment use and safety with use of the equipment.   Cardiac Rehab from 07/29/2020 in [facility] Cardiac and Pulmonary Rehab  Date 06/08/20  Educator AS  Instruction Review Code 1- Verbalizes Understanding      Education: Exercise Physiology & General Exercise Guidelines: - Group verbal and written instruction with models to review the exercise physiology of the cardiovascular system and associated critical values. Provides general exercise guidelines with specific guidelines to those with heart or lung disease.    Cardiac Rehab from 07/29/2020 in [facility] Cardiac and Pulmonary Rehab  Date 07/08/20  Educator AS  Instruction Review Code 1- Verbalizes Understanding      Education: Flexibility, Balance, Mind/Body Relaxation: Provides group verbal/written instruction on the benefits of flexibility and balance training, including mind/body exercise modes such as yoga, pilates and tai chi.  Demonstration and skill practice provided.   Cardiac Rehab from 07/17/2017 in [facility] Cardiac and Pulmonary Rehab  Date 07/10/17  Educator AS  Instruction Review Code (retired) 2- meets goals/outcomes      Activity Barriers & Risk Stratification:  Activity Barriers & Cardiac Risk Stratification - 05/28/20 1346      Activity Barriers & Cardiac Risk Stratification   Activity Barriers Chest Pain/Angina;Deconditioning    Cardiac Risk Stratification High           6 Minute Walk:  6 Minute Walk    Row Name 06/08/20 1624         6  Minute Walk   Phase Initial     Distance 1300 feet     Walk Time 6 minutes     # of Rest Breaks 0     MPH 2.46     METS 2.26     RPE 17     Perceived Dyspnea  2     VO2 Peak 7.9     Symptoms Yes (comment)     Comments L side - has asthma - ha sspoken to Dr [name] - has treatment about 1x per year     Resting HR 87 bpm     Resting BP 112/64     Resting Oxygen Saturation  96 %  Exercise Oxygen Saturation  during 6 min walk 96 %     Max Ex. HR 101 bpm     Max Ex. BP 112/66     2 Minute Post BP 112/68            Oxygen Initial Assessment:   Oxygen Re-Evaluation:   Oxygen Discharge (Final Oxygen Re-Evaluation):   Initial Exercise Prescription:  Initial Exercise Prescription - 06/08/20 1600      Date of Initial Exercise RX and Referring Provider   Date 06/08/20    Referring Provider [name]      Treadmill   MPH 2    Grade 0    Minutes 15    METs 2.5      Recumbant Bike   Level 1    RPM 60    Minutes 15    METs 2.5      NuStep   Level 1    SPM 80    Minutes 15    METs 2.5      Recumbant Elliptical   Level 1    RPM 50    Minutes 15    METs 2.5      REL-XR   Level 1    Speed 50    Minutes 15    METs 2.5      Prescription Details   Frequency (times per week) 3    Duration Progress to 30 minutes of continuous aerobic without signs/symptoms of physical distress      Intensity   THRR 40-80% of Max Heartrate 109-132    Ratings of Perceived Exertion 11-13    Perceived Dyspnea 0-4      Resistance Training   Training Prescription Yes    Weight 3 lb    Reps 10-15           Perform Capillary Blood Glucose checks as needed.  Exercise Prescription Changes:  Exercise Prescription Changes    Row Name 06/08/20 1600 06/15/20 1600 07/01/20 1600 07/13/20 1500 07/29/20 1400     Response to Exercise   Blood Pressure (Admit) 112/64 110/64 132/70 120/72 124/70   Blood Pressure (Exercise) 112/66 148/68 140/72 138/70 156/80   Blood Pressure (Exit)  112/68 140/80 112/68 124/70 100/60   Heart Rate (Admit) 87 bpm 84 bpm 115 bpm 102 bpm 98 bpm   Heart Rate (Exercise) 101 bpm 106 bpm 135 bpm 112 bpm 113 bpm   Heart Rate (Exit) 89 bpm 101 bpm 61 bpm 81 bpm 96 bpm   Oxygen Saturation (Admit) 96 % -- -- -- --   Oxygen Saturation (Exercise) 96 % -- -- -- --   Rating of Perceived Exertion (Exercise) '17 15 15 13 14   ' Perceived Dyspnea (Exercise) 2 -- -- -- --   Symptoms L side due to asthman none none none none   Comments -- 1st full day of exercise -- -- --   Duration -- Progress to 30 minutes of  aerobic without signs/symptoms of physical distress Continue with 30 min of aerobic exercise without signs/symptoms of physical distress. Continue with 30 min of aerobic exercise without signs/symptoms of physical distress. Continue with 30 min of aerobic exercise without signs/symptoms of physical distress.   Intensity -- THRR unchanged THRR unchanged THRR unchanged THRR unchanged     Progression   Progression -- Continue to progress workloads to maintain intensity without signs/symptoms of physical distress. Continue to progress workloads to maintain intensity without signs/symptoms of physical distress. Continue to progress workloads to maintain intensity without  signs/symptoms of physical distress. Continue to progress workloads to maintain intensity without signs/symptoms of physical distress.   Average METs -- 2.92 3.4 4.29 4.4     Resistance Training   Training Prescription -- Yes Yes Yes Yes   Weight -- 3 lb 3 lb 3 lb 3 lb   Reps -- 10-15 10-15 10-15 10-15     Interval Training   Interval Training -- No No No No     Treadmill   MPH -- 2 2.5 2.6 2.5   Grade -- 0 1.5 0 1.5   Minutes -- '15 15 15 15   ' METs -- 2.53 3.43 2.99 3.43     NuStep   Level -- -- -- 5 --   Minutes -- -- -- 15 --   METs -- -- -- 5.8 --     REL-XR   Level -- -- -- 6 6   Minutes -- -- -- 15 15   METs -- -- -- 3.4 --     T5 Nustep   Level -- 2 2 -- --   SPM --  -- 80 -- --   Minutes -- 15 15 -- --   METs -- 3.3 3.4 -- --     Biostep-RELP   Level -- -- -- 6 --   Minutes -- -- -- 15 --   METs -- -- -- 5 --          Exercise Comments:   Exercise Goals and Review:  Exercise Goals    Row Name 06/08/20 1630             Exercise Goals   Increase Physical Activity Yes       Intervention Provide advice, education, support and counseling about physical activity/exercise needs.;Develop an individualized exercise prescription for aerobic and resistive training based on initial evaluation findings, risk stratification, comorbidities and participant's personal goals.       Expected Outcomes Short Term: Attend rehab on a regular basis to increase amount of physical activity.;Long Term: Add in home exercise to make exercise part of routine and to increase amount of physical activity.;Long Term: Exercising regularly at least 3-5 days a week.       Increase Strength and Stamina Yes       Intervention Provide advice, education, support and counseling about physical activity/exercise needs.;Develop an individualized exercise prescription for aerobic and resistive training based on initial evaluation findings, risk stratification, comorbidities and participant's personal goals.       Expected Outcomes Short Term: Increase workloads from initial exercise prescription for resistance, speed, and METs.;Short Term: Perform resistance training exercises routinely during rehab and add in resistance training at home;Long Term: Improve cardiorespiratory fitness, muscular endurance and strength as measured by increased METs and functional capacity (6MWT)       Able to understand and use rate of perceived exertion (RPE) scale Yes       Intervention Provide education and explanation on how to use RPE scale       Expected Outcomes Short Term: Able to use RPE daily in rehab to express subjective intensity level;Long Term:  Able to use RPE to guide intensity level when  exercising independently       Able to understand and use Dyspnea scale Yes       Intervention Provide education and explanation on how to use Dyspnea scale       Expected Outcomes Short Term: Able to use Dyspnea scale daily in rehab to express subjective sense of shortness  of breath during exertion;Long Term: Able to use Dyspnea scale to guide intensity level when exercising independently       Knowledge and understanding of Target Heart Rate Range (THRR) Yes       Intervention Provide education and explanation of THRR including how the numbers were predicted and where they are located for reference       Expected Outcomes Short Term: Able to state/look up THRR;Short Term: Able to use daily as guideline for intensity in rehab;Long Term: Able to use THRR to govern intensity when exercising independently       Able to check pulse independently Yes       Intervention Provide education and demonstration on how to check pulse in carotid and radial arteries.;Review the importance of being able to check your own pulse for safety during independent exercise       Expected Outcomes Short Term: Able to explain why pulse checking is important during independent exercise;Long Term: Able to check pulse independently and accurately       Understanding of Exercise Prescription Yes       Intervention Provide education, explanation, and written materials on patient's individual exercise prescription       Expected Outcomes Short Term: Able to explain program exercise prescription;Long Term: Able to explain home exercise prescription to exercise independently              Exercise Goals Re-Evaluation :  Exercise Goals Re-Evaluation    Row Name 06/15/20 1534 06/25/20 1536 07/01/20 1632 07/13/20 1508 07/20/20 1610     Exercise Goal Re-Evaluation   Exercise Goals Review Increase Physical Activity;Able to understand and use rate of perceived exertion (RPE) scale;Knowledge and understanding of Target Heart Rate  Range (THRR);Understanding of Exercise Prescription;Increase Strength and Stamina;Able to check pulse independently Increase Physical Activity;Increase Strength and Stamina Increase Physical Activity;Increase Strength and Stamina;Understanding of Exercise Prescription Increase Physical Activity;Increase Strength and Stamina;Understanding of Exercise Prescription Increase Physical Activity;Increase Strength and Stamina;Understanding of Exercise Prescription   Comments Reviewed RPE and dyspnea scales, THR and program prescription with pt today.  Pt voiced understanding and was given a copy of goals to take home. Patient has not been exercising at home lately. The gyms that he was going to were closed and he has not been back because he is not comfortable with going. Brett Blake is progressing well and has increased speed and grade on TM.  Staff will monitor progress. Brett Blake continues to do well in rehab.  He is now up to level 6 on the XR and BioStep.  We will continue to monitor his progress. Brett Blake feels exercise is good for him.  He can tell he has more stamina.  He has gone up to 5 lb for hand weights.   Expected Outcomes Short: Use RPE daily to regulate intensity. Long: Follow program prescription in THR. Short: review home exercise. Long: join a gym. Short: attend consistently Long:  improve stamina Short: Try to increase hand weights Long: Conitnue to improve stamina. Short: continue to exercise consistently Long:  continue to increase workloads   Row Name 07/29/20 1402             Exercise Goal Re-Evaluation   Exercise Goals Review Increase Physical Activity;Increase Strength and Stamina;Understanding of Exercise Prescription       Comments Brett Blake attends consistently and works at [employer] 12-15.  Staff will monitor progress.       Expected Outcomes Short: continue to attend consistently Long:  improve stamina  Discharge Exercise Prescription (Final Exercise Prescription Changes):  Exercise  Prescription Changes - 07/29/20 1400      Response to Exercise   Blood Pressure (Admit) 124/70    Blood Pressure (Exercise) 156/80    Blood Pressure (Exit) 100/60    Heart Rate (Admit) 98 bpm    Heart Rate (Exercise) 113 bpm    Heart Rate (Exit) 96 bpm    Rating of Perceived Exertion (Exercise) 14    Symptoms none    Duration Continue with 30 min of aerobic exercise without signs/symptoms of physical distress.    Intensity THRR unchanged      Progression   Progression Continue to progress workloads to maintain intensity without signs/symptoms of physical distress.    Average METs 4.4      Resistance Training   Training Prescription Yes    Weight 3 lb    Reps 10-15      Interval Training   Interval Training No      Treadmill   MPH 2.5    Grade 1.5    Minutes 15    METs 3.43      REL-XR   Level 6    Minutes 15           Nutrition:  Target Goals: Understanding of nutrition guidelines, daily intake of sodium <1548m, cholesterol <2074m calories 30% from fat and 7% or less from saturated fats, daily to have 5 or more servings of fruits and vegetables.  Education: Controlling Sodium/Reading Food Labels -Group verbal and written material supporting the discussion of sodium use in heart healthy nutrition. Review and explanation with models, verbal and written materials for utilization of the food label.   [facility]  Date 06/26/17  Educator PI  Instruction Review Code (retired) 2- meets goals/outcomes      Education: General Nutrition Guidelines/Fats and Fiber: -Group instruction provided by verbal, written material, models and posters to present the general guidelines for heart healthy nutrition. Gives an explanation and review of dietary fats and fiber.   [facility]  Date 06/19/17  Educator PI  Instruction Review Code (retired) 2- meets goals/outcomes       Biometrics:  Pre Biometrics - 06/08/20 1631      Pre Biometrics   Height 5' 5.25" (1.657 m)    Weight 176 lb 3.2 oz (79.9 kg)    BMI (Calculated) 29.11    Single Leg Stand 3.87 seconds            Nutrition Therapy Plan and Nutrition Goals:  Nutrition Therapy & Goals - 07/22/20 1614      Nutrition Therapy   Diet Heart healthy, Low Na, T2DM    Protein (specify units) 65g    Fiber 30 grams    Whole Grain Foods 3 servings    Saturated Fats 12 max. grams    Fruits and Vegetables 5 servings/day    Sodium 1.5 grams      Personal Nutrition Goals   Nutrition Goal ST: increase fiber, ex: adding oatmeal LT: wants to walk well again, get active again    Comments Well controlled T2DM. Pt reports Dr. MiSabra Heckook him off medication becuase he was [place]. B: boiled eggs cup of hot chocolate L: sandwich - may not eat S: pack of nabs with regular soda D: example last night. steak, pinto beans, squash and onions, cucumbers and tomatoes. Pt reports loving fruits and vegetables and  even has a garden. 2% milk, unsweet tea with splenda is normally what he will drink.  Discussed heart healthy eating.      Intervention Plan   Intervention Prescribe, educate and counsel regarding individualized specific dietary modifications aiming towards targeted core components such as weight, hypertension, lipid management, diabetes, heart failure and other comorbidities.    Expected Outcomes Short Term Goal: Understand basic principles of dietary content, such as calories, fat, sodium, cholesterol and nutrients.;Short Term Goal: A plan has been developed with personal nutrition goals set during dietitian appointment.;Long Term Goal: Adherence to prescribed nutrition plan.           Nutrition Assessments:  Nutrition Assessments - 06/08/20 1636      MEDFICTS Scores   Pre Score 60           MEDIFICTS Score Key:          ?70 Need to make dietary changes          40-70 Heart Healthy Diet         ?  40 Therapeutic Level Cholesterol Diet  Nutrition Goals Re-Evaluation:  Nutrition Goals Re-Evaluation    [place] Name 06/25/20 1542             Goals   Current Weight 177 lb (80.3 kg)       Nutrition Goal Lose more weight.       Comment He states that he sometimes eats to much. He does not have an appetite like he used to. He wants to meet with the dietician to help gain more of an appetite.       Expected Outcome Short: meet with dietician. Long: maintain a heart healthy diet and increase appetite.              Nutrition Goals Discharge (Final Nutrition Goals Re-Evaluation):  Nutrition Goals Re-Evaluation - 06/25/20 1542      Goals   Current Weight 177 lb (80.3 kg)    Nutrition Goal Lose more weight.    Comment He states that he sometimes eats to much. He does not have an appetite like he used to. He wants to meet with the dietician to help gain more of an appetite.    Expected Outcome Short: meet with dietician. Long: maintain a heart healthy diet and increase appetite.           Psychosocial: Target Goals: Acknowledge presence or absence of significant depression and/or stress, maximize coping skills, provide positive support system. Participant is able to verbalize types and ability to use techniques and skills needed for reducing stress and depression.   Education: Depression - Provides group verbal and written instruction on the correlation between heart/lung disease and depressed mood, treatment options, and the stigmas associated with seeking treatment.   Cardiac Rehab from 07/29/2020 in [facility] Cardiac and Pulmonary Rehab  Date 07/29/20  Educator AS  Instruction Review Code 1- Verbalizes Understanding      Education: Sleep Hygiene -Provides group verbal and written instruction about how sleep can affect your health.  Define sleep hygiene, discuss sleep cycles and impact of sleep habits. Review good sleep hygiene tips.     Education: Stress and Anxiety: - Provides  group verbal and written instruction about the health risks of elevated stress and causes of high stress.  Discuss the correlation between heart/lung disease and anxiety and treatment options. Review healthy ways to manage with stress and anxiety.   Cardiac Rehab from 07/29/2020 in [facility] Cardiac and Pulmonary Rehab  Date  07/29/20  Educator AS  Instruction Review Code 1- Verbalizes Understanding       Initial Review & Psychosocial Screening:  Initial Psych Review & Screening - 05/28/20 1348      Initial Review   Current issues with Current Stress Concerns    Comments anxiety about what is happening to our country. Concerned his memory is not good. Has med to help      Family Dynamics   Good Support System? Yes   wife, daughter,granddaughter lives at home with Brett Blake, church family, neighbors     Barriers   Psychosocial barriers to participate in program There are no identifiable barriers or psychosocial needs.;The patient should benefit from training in stress management and relaxation.      Screening Interventions   Interventions Encouraged to exercise;To provide support and resources with identified psychosocial needs;Provide feedback about the scores to participant    Expected Outcomes Short Term goal: Utilizing psychosocial counselor, staff and physician to assist with identification of specific Stressors or current issues interfering with healing process. Setting desired goal for each stressor or current issue identified.;Long Term Goal: Stressors or current issues are controlled or eliminated.;Short Term goal: Identification and review with participant of any Quality of Life or Depression concerns found by scoring the questionnaire.;Long Term goal: The participant improves quality of Life and PHQ9 Scores as seen by post scores and/or verbalization of changes           Quality of Life Scores:   Scores of 19 and below usually indicate a poorer quality of life in these areas.  A  difference of  2-3 points is a clinically meaningful difference.  A difference of 2-3 points in the total score of the Quality of Life Index has been associated with significant improvement in overall quality of life, self-image, physical symptoms, and general health in studies assessing change in quality of life.  PHQ-9: Recent Review Flowsheet Data    Depression screen [facility] 2/9 06/08/2020 05/08/2017   Decreased Interest 1 1   Down, Depressed, Hopeless 1 0   PHQ - 2 Score 2 1   Altered sleeping 0 2    Tired, decreased energy 1 2   Change in appetite 1 3    Feeling bad or failure about yourself  1 0   Trouble concentrating 1 1   Moving slowly or fidgety/restless 0 0   Suicidal thoughts 0 0   PHQ-9 Score 6 9   Difficult doing work/chores Somewhat difficult Somewhat difficult      Interpretation of Total Score  Total Score Depression Severity:  1-4 = Minimal depression, 5-9 = Mild depression, 10-14 = Moderate depression, 15-19 = Moderately severe depression, 20-27 = Severe depression   Psychosocial Evaluation and Intervention:  Psychosocial Evaluation - 05/28/20 1359      Psychosocial Evaluation & Interventions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

## 2020-08-12 ENCOUNTER — Other Ambulatory Visit: Payer: Self-pay | Admitting: Neurology

## 2020-08-12 ENCOUNTER — Telehealth: Payer: Self-pay | Admitting: *Deleted

## 2020-08-12 ENCOUNTER — Ambulatory Visit: Payer: Self-pay

## 2020-08-12 ENCOUNTER — Encounter: Payer: Self-pay | Admitting: *Deleted

## 2020-08-12 DIAGNOSIS — R569 Unspecified convulsions: Secondary | ICD-10-CM

## 2020-08-12 DIAGNOSIS — I208 Other forms of angina pectoris: Secondary | ICD-10-CM

## 2020-08-12 NOTE — Telephone Encounter (Signed)
Patient called and says he was around his daughter on Saturday in a car for 4 hours. She started running a fever on Monday and is going to be COVID tested tomorrow. He says he has symptoms of chest/sinus congestion and sore throat. He says he's fully vaccinated and is wondering if he should go get tested. He says he's a little SOB, but not too bad. He says he doesn't have a fever. I advised due to his high risk, he should get tested. I asked has he spoken to his PCP, he says he is waiting on a call back. I advised since he's having mild SOB he will need to be evaluated in the UC. He says he will go to the [place] walk in clinic.     Summary: covid questions   Patient calling community line to discuss the need for covid testing. Patient states he has sore throat with congestion. He wanted to note that he is fully vaccinated.       Reason for Disposition . MILD difficulty breathing (e.g., minimal/no SOB at rest, SOB with walking, pulse <100)  Answer Assessment - Initial Assessment Questions 1. COVID-19 CLOSE CONTACT: "Who is the person with the confirmed or suspected COVID-19 infection that you were exposed to?"     My daughter 2. PLACE of CONTACT: "Where were you when you were exposed to COVID-19?" (e.g., home, school, medical waiting room; which city?)     In the car for 4 hours with her and she wasn't sick 3. TYPE of CONTACT: "How much contact was there?" (e.g., sitting next to, live in same house, work in same office, same building)     Driving in the car with my daughter 4. DURATION of CONTACT: "How long were you in contact with the COVID-19 patient?" (e.g., a few seconds, passed by person, a few minutes, 15 minutes or longer, live with the patient)     4 hours riding in the car 5. MASK: "Were you wearing a mask?" "Was the other person wearing a mask?" Note: wearing a mask reduces the risk of an otherwise close contact.     No 6. DATE of CONTACT: "When did you have contact with a COVID-19  patient?" (e.g., how many days ago)     Saturday 7. COMMUNITY SPREAD: "Are there lots of cases of COVID-19 (community spread) where you live?" (See public health department website, if unsure)       Not sure 8. SYMPTOMS: "Do you have any symptoms?" (e.g., fever, cough, breathing difficulty, loss of taste or smell)     Sore throat, nasal congestion 9. PREGNANCY OR POSTPARTUM: "Is there any chance you are pregnant?" "When was your last menstrual period?" "Did you deliver in the last 2 weeks?"     N/A 10. HIGH RISK: "Do you have any heart or lung problems?" "Do you have a weak immune system?" (e.g., heart failure, COPD, asthma, HIV positive, chemotherapy, renal failure, diabetes mellitus, sickle cell anemia, obesity)      Bronchial asthma, heart problems (angina) 11. TRAVEL: "Have you traveled out of the country recently?" If Yes, ask: "When and where?" Also ask about out-of-state travel, since the CDC has identified some high-risk cities for community spread in the [place]. Note: Travel becomes less relevant if there is widespread community transmission where the patient lives.       No  Protocols used: CORONAVIRUS (COVID-19) DIAGNOSED OR SUSPECTED-A-AH, CORONAVIRUS (COVID-19) EXPOSURE-A-AH

## 2020-08-12 NOTE — Telephone Encounter (Signed)
Called to check on pt. He was on vacation and is now out sick.  Last night he found out that his daughter is getting tested for COVID-19 tomorrow.  He called the doctor and was advised to get tested.  Pt given number to schedule testing. He will keep [place] posted.

## 2020-08-17 ENCOUNTER — Encounter: Payer: Medicare Other | Admitting: *Deleted

## 2020-08-17 DIAGNOSIS — I208 Other forms of angina pectoris: Secondary | ICD-10-CM | POA: Diagnosis not present

## 2020-08-17 NOTE — Progress Notes (Signed)
Daily Session Note  Patient Details  Name: Brett Blake MRN: [id number] Date of Birth: 17-Jul-1941 Referring Provider:     Cardiac Rehab from 06/08/2020 in [facility] Cardiac and Pulmonary Rehab  Referring Provider [name]      Encounter Date: 08/17/2020  Check In:  Session Check In - 08/17/20 1646      Check-In   Supervising physician immediately available to respond to emergencies See telemetry face sheet for immediately available ER MD    Location [facility]-Cardiac & Pulmonary Rehab    Staff Present Heath Lark, RN, BSN, CCRP;Jessica Meservey, MA, RCEP, CCRP, [place], [place], ACSM CEP, Exercise Physiologist;Kelly Amedeo Plenty, BS, ACSM CEP, Exercise Physiologist    Virtual Visit No    Medication changes reported     No    Fall or balance concerns reported    No    Warm-up and Cool-down Performed on first and last piece of equipment    Resistance Training Performed Yes    VAD Patient? No    PAD/SET Patient? No      Pain Assessment   Currently in Pain? No/denies              Social History   Tobacco Use  Smoking Status Former Smoker  . Packs/day: 1.00  . Years: 15.00  . Pack years: 15.00  . Types: Cigarettes  Smokeless Tobacco Never Used  Tobacco Comment   quit 1977    Goals Met:  Independence with exercise equipment Exercise tolerated well No report of cardiac concerns or symptoms  Goals Unmet:  Not Applicable  Comments: Pt able to follow exercise prescription today without complaint.  Will continue to monitor for progression.    Dr. Emily Filbert is Medical Director for [place] and [facility].

## 2020-08-19 ENCOUNTER — Encounter: Payer: Medicare Other | Admitting: *Deleted

## 2020-08-19 ENCOUNTER — Other Ambulatory Visit: Payer: Self-pay

## 2020-08-19 DIAGNOSIS — I208 Other forms of angina pectoris: Secondary | ICD-10-CM

## 2020-08-19 NOTE — Progress Notes (Signed)
Daily Session Note  Patient Details  Name: Brett Blake MRN: [id number] Date of Birth: 04/01/1941 Referring Provider:     Cardiac Rehab from 06/08/2020 in [facility] Cardiac and Pulmonary Rehab  Referring Provider Miller      Encounter Date: 08/19/2020  Check In:  Session Check In - 08/19/20 1612      Check-In   Supervising physician immediately available to respond to emergencies See telemetry face sheet for immediately available ER MD    Location [facility]-Cardiac & Pulmonary Rehab    Staff Present  , RN BSN;Kara Langdon, MS Exercise Physiologist;Melissa Caiola RDN, LDN;Amanda Sommer, BA, ACSM CEP, Exercise Physiologist    Virtual Visit No    Medication changes reported     No    Fall or balance concerns reported    No    Tobacco Cessation No Change    Warm-up and Cool-down Performed on first and last piece of equipment    Resistance Training Performed Yes    VAD Patient? No    PAD/SET Patient? No      Pain Assessment   Currently in Pain? No/denies              Social History   Tobacco Use  Smoking Status Former Smoker  . Packs/day: 1.00  . Years: 15.00  . Pack years: 15.00  . Types: Cigarettes  Smokeless Tobacco Never Used  Tobacco Comment   quit 1977    Goals Met:  Independence with exercise equipment Exercise tolerated well No report of cardiac concerns or symptoms Strength training completed today  Goals Unmet:  Not Applicable  Comments: Pt able to follow exercise prescription today without complaint.  Will continue to monitor for progression.    Dr. Mark Miller is Medical Director for [facility] and [facility]. 

## 2020-08-20 ENCOUNTER — Other Ambulatory Visit: Payer: Self-pay

## 2020-08-20 ENCOUNTER — Encounter: Payer: Medicare Other | Admitting: *Deleted

## 2020-08-20 DIAGNOSIS — I208 Other forms of angina pectoris: Secondary | ICD-10-CM

## 2020-08-20 NOTE — Progress Notes (Signed)
Daily Session Note ° °Patient Details  °Name: Brett Blake °MRN: [id number] °Date of Birth: 06/27/1941 °Referring Provider:   °  Cardiac Rehab from 06/08/2020 in [facility] Cardiac and Pulmonary Rehab  °Referring Provider Brett Blake  °  ° ° °Encounter Date: 08/20/2020 ° °Check In: ° Session Check In - 08/20/20 1608   °  ° Check-In  ° Supervising physician immediately available to respond to emergencies See telemetry face sheet for immediately available ER MD   ° Location [facility]-Cardiac & Pulmonary Rehab   ° Staff Present  , RN BSN;Kara Langdon, MS Exercise Physiologist;Kelly Hayes, BS, ACSM CEP, Exercise Physiologist;Amanda Sommer, BA, ACSM CEP, Exercise Physiologist   ° Virtual Visit No   ° Medication changes reported     No   ° Fall or balance concerns reported    No   ° Warm-up and Cool-down Performed on first and last piece of equipment   ° Resistance Training Performed Yes   ° VAD Patient? No   ° PAD/SET Patient? No   °  ° Pain Assessment  ° Currently in Pain? No/denies   °  °  °  ° ° ° ° ° °Social History  ° °Tobacco Use  °Smoking Status Former Smoker  °• Packs/day: 1.00  °• Years: 15.00  °• Pack years: 15.00  °• Types: Cigarettes  °Smokeless Tobacco Never Used  °Tobacco Comment  ° quit 1977  ° ° °Goals Met:  °Independence with exercise equipment °Exercise tolerated well °No report of cardiac concerns or symptoms °Strength training completed today ° °Goals Unmet:  °Not Applicable ° °Comments: Pt able to follow exercise prescription today without complaint.  Will continue to monitor for progression. ° ° ° °Dr. Mark Brett Blake is Medical Director for [facility] and [facility]. °

## 2020-08-24 ENCOUNTER — Encounter: Payer: Medicare Other | Admitting: *Deleted

## 2020-08-24 ENCOUNTER — Other Ambulatory Visit: Payer: Self-pay

## 2020-08-24 DIAGNOSIS — I208 Other forms of angina pectoris: Secondary | ICD-10-CM | POA: Diagnosis not present

## 2020-08-24 NOTE — Progress Notes (Signed)
Daily Session Note  Patient Details  Name: Brett Blake MRN: [id number] Date of Birth: 08/28/1941 Referring Provider:     Cardiac Rehab from 06/08/2020 in [facility] Cardiac and Pulmonary Rehab  Referring Provider [name]      Encounter Date: 08/24/2020  Check In:  Session Check In - 08/24/20 1603      Check-In   Supervising physician immediately available to respond to emergencies See telemetry face sheet for immediately available ER MD    Location [facility]-Cardiac & Pulmonary Rehab    Staff Present Renita Papa, RN Margurite Auerbach, MS Exercise Physiologist;Kelly Amedeo Plenty, BS, ACSM CEP, Exercise Physiologist    Virtual Visit No    Medication changes reported     No    Fall or balance concerns reported    No    Warm-up and Cool-down Performed on first and last piece of equipment    Resistance Training Performed Yes    VAD Patient? No    PAD/SET Patient? No      Pain Assessment   Currently in Pain? No/denies              Social History   Tobacco Use  Smoking Status Former Smoker  . Packs/day: 1.00  . Years: 15.00  . Pack years: 15.00  . Types: Cigarettes  Smokeless Tobacco Never Used  Tobacco Comment   quit 1977    Goals Met:  Independence with exercise equipment Exercise tolerated well No report of cardiac concerns or symptoms Strength training completed today  Goals Unmet:  Not Applicable  Comments: Pt able to follow exercise prescription today without complaint.  Will continue to monitor for progression.    Dr. Emily Filbert is Medical Director for [place] and [facility].

## 2020-08-26 ENCOUNTER — Other Ambulatory Visit: Payer: Self-pay

## 2020-08-26 ENCOUNTER — Encounter: Payer: Medicare Other | Attending: Internal Medicine | Admitting: *Deleted

## 2020-08-26 DIAGNOSIS — I10 Essential (primary) hypertension: Secondary | ICD-10-CM | POA: Insufficient documentation

## 2020-08-26 DIAGNOSIS — Z87891 Personal history of nicotine dependence: Secondary | ICD-10-CM | POA: Insufficient documentation

## 2020-08-26 DIAGNOSIS — Z7982 Long term (current) use of aspirin: Secondary | ICD-10-CM | POA: Insufficient documentation

## 2020-08-26 DIAGNOSIS — F329 Major depressive disorder, single episode, unspecified: Secondary | ICD-10-CM | POA: Insufficient documentation

## 2020-08-26 DIAGNOSIS — E785 Hyperlipidemia, unspecified: Secondary | ICD-10-CM | POA: Diagnosis not present

## 2020-08-26 DIAGNOSIS — M1990 Unspecified osteoarthritis, unspecified site: Secondary | ICD-10-CM | POA: Diagnosis not present

## 2020-08-26 DIAGNOSIS — F419 Anxiety disorder, unspecified: Secondary | ICD-10-CM | POA: Insufficient documentation

## 2020-08-26 DIAGNOSIS — G4730 Sleep apnea, unspecified: Secondary | ICD-10-CM | POA: Insufficient documentation

## 2020-08-26 DIAGNOSIS — K219 Gastro-esophageal reflux disease without esophagitis: Secondary | ICD-10-CM | POA: Diagnosis not present

## 2020-08-26 DIAGNOSIS — Z7984 Long term (current) use of oral hypoglycemic drugs: Secondary | ICD-10-CM | POA: Diagnosis not present

## 2020-08-26 DIAGNOSIS — J449 Chronic obstructive pulmonary disease, unspecified: Secondary | ICD-10-CM | POA: Diagnosis not present

## 2020-08-26 DIAGNOSIS — I208 Other forms of angina pectoris: Secondary | ICD-10-CM | POA: Insufficient documentation

## 2020-08-26 DIAGNOSIS — E119 Type 2 diabetes mellitus without complications: Secondary | ICD-10-CM | POA: Diagnosis not present

## 2020-08-26 DIAGNOSIS — Z79899 Other long term (current) drug therapy: Secondary | ICD-10-CM | POA: Insufficient documentation

## 2020-08-26 NOTE — Progress Notes (Signed)
Daily Session Note  Patient Details  Name: Brett Blake MRN: [id number] Date of Birth: 06/19/1941 Referring Provider:     Cardiac Rehab from 06/08/2020 in [facility] Cardiac and Pulmonary Rehab  Referring Provider [name]      Encounter Date: 08/26/2020  Check In:  Session Check In - 08/26/20 1558      Check-In   Supervising physician immediately available to respond to emergencies See telemetry face sheet for immediately available ER MD    Location [facility]-Cardiac & Pulmonary Rehab    Staff Present Renita Papa, RN BSN;Joseph Lou Miner, [place] Exercise Physiologist;Melissa [place] RDN, Rowe Pavy, BA, ACSM CEP, Exercise Physiologist    Virtual Visit No    Medication changes reported     No    Fall or balance concerns reported    No    Warm-up and Cool-down Performed on first and last piece of equipment    Resistance Training Performed Yes    VAD Patient? No    PAD/SET Patient? No      Pain Assessment   Currently in Pain? No/denies              Social History   Tobacco Use  Smoking Status Former Smoker   Packs/day: 1.00   Years: 15.00   Pack years: 15.00   Types: Cigarettes  Smokeless Tobacco Never Used  Tobacco Comment   quit 1977    Goals Met:  Independence with exercise equipment Exercise tolerated well No report of cardiac concerns or symptoms Strength training completed today  Goals Unmet:  Not Applicable  Comments: Pt able to follow exercise prescription today without complaint.  Will continue to monitor for progression.    Dr. Emily Filbert is Medical Director for [place] and [facility].

## 2020-08-27 ENCOUNTER — Encounter: Payer: Medicare Other | Admitting: *Deleted

## 2020-08-27 ENCOUNTER — Other Ambulatory Visit: Payer: Self-pay

## 2020-08-27 DIAGNOSIS — I208 Other forms of angina pectoris: Secondary | ICD-10-CM | POA: Diagnosis not present

## 2020-08-27 NOTE — Progress Notes (Signed)
Daily Session Note  Patient Details  Name: Brett Blake MRN: [id number] Date of Birth: [birth year] Referring Provider:     Cardiac Rehab from 06/08/2020 in [facility] Cardiac and Pulmonary Rehab  Referring Provider [name]      Encounter Date: 08/27/2020  Check In:  Session Check In - 08/27/20 1559      Check-In   Supervising physician immediately available to respond to emergencies See telemetry face sheet for immediately available ER MD    Location [facility]-Cardiac & Pulmonary Rehab    Staff Present Renita Papa, RN BSN;Jessica Luan Pulling, MA, RCEP, CCRP, CCET    Virtual Visit No    Medication changes reported     No    Fall or balance concerns reported    No    Warm-up and Cool-down Performed on first and last piece of equipment    Resistance Training Performed Yes    VAD Patient? No    PAD/SET Patient? No      Pain Assessment   Currently in Pain? No/denies              Social History   Tobacco Use  Smoking Status Former Smoker  . Packs/day: 1.00  . Years: 15.00  . Pack years: 15.00  . Types: Cigarettes  Smokeless Tobacco Never Used  Tobacco Comment   quit 1977    Goals Met:  Independence with exercise equipment Exercise tolerated well No report of cardiac concerns or symptoms Strength training completed today  Goals Unmet:  Not Applicable  Comments: Pt able to follow exercise prescription today without complaint.  Will continue to monitor for progression.    Dr. Emily Filbert is Medical Director for [place] and [facility].

## 2020-09-02 ENCOUNTER — Encounter: Payer: Self-pay | Admitting: *Deleted

## 2020-09-02 DIAGNOSIS — I208 Other forms of angina pectoris: Secondary | ICD-10-CM

## 2020-09-02 NOTE — Progress Notes (Signed)
Cardiac Individual Treatment Plan  Patient Details  Name: Brett Blake MRN: [id number] Date of Birth: [birth year] Referring Provider:     Cardiac Rehab from 06/08/2020 in [facility] Cardiac and Pulmonary Rehab  Referring Provider [name]      Initial Encounter Date:    Cardiac Rehab from 06/08/2020 in [facility] Cardiac and Pulmonary Rehab  Date 06/08/20      Visit Diagnosis: Chronic stable angina ([place])  Patient's Home Medications on Admission:  Current Outpatient Medications:    albuterol (ACCUNEB) 1.25 MG/3ML nebulizer solution, TAKE 3 MLS (1.25 MG TOTAL) BY NEBULIZATION EVERY 6 (SIX) HOURS AS NEEDED FOR WHEEZING, Disp: , Rfl:    ALPRAZolam (XANAX) 0.5 MG tablet, Take 0.5 tablets by mouth daily as needed., Disp: , Rfl:    amitriptyline (ELAVIL) 10 MG tablet, Take 10 mg by mouth at bedtime., Disp: , Rfl:    aspirin 81 MG chewable tablet, Chew 81 mg by mouth daily., Disp: , Rfl:    bisoprolol (ZEBETA) 5 MG tablet, Take 2.5 mg by mouth 2 (two) times daily., Disp: , Rfl:    diltiazem (CARDIZEM CD) 180 MG 24 hr capsule, Take 1 capsule by mouth 2 (two) times a day., Disp: , Rfl:    donepezil (ARICEPT) 5 MG tablet, Take 5 mg by mouth at bedtime. , Disp: , Rfl:    famotidine (PEPCID) 40 MG tablet, Take 40 mg by mouth at bedtime., Disp: , Rfl:    fluticasone (FLONASE) 50 MCG/ACT nasal spray, Place 1 spray into both nostrils daily as needed for allergies or rhinitis., Disp: , Rfl:    glucosamine-chondroitin 500-400 MG tablet, Take 1 tablet by mouth daily., Disp: , Rfl:    ibuprofen (ADVIL,MOTRIN) 200 MG tablet, Take 200 mg by mouth every 6 (six) hours as needed., Disp: , Rfl:    isosorbide mononitrate (IMDUR) 30 MG 24 hr tablet, Take 30 mg by mouth daily., Disp: , Rfl:    montelukast (SINGULAIR) 10 MG tablet, Take 10 mg by mouth at bedtime., Disp: , Rfl:    Multiple Vitamins-Minerals (MULTIVITAMIN PO), Take 1 tablet by mouth daily., Disp: , Rfl:    nitroGLYCERIN (NITROSTAT) 0.4 MG SL  tablet, Place 0.4 mg under the tongue every 5 (five) minutes as needed for chest pain., Disp: , Rfl:    omeprazole (PRILOSEC) 40 MG capsule, Take 40 mg by mouth daily., Disp: , Rfl:    simvastatin (ZOCOR) 20 MG tablet, Take 20 mg by mouth daily., Disp: , Rfl:    SYMBICORT 80-4.5 MCG/ACT inhaler, Inhale 2 puffs into the lungs 2 (two) times daily as needed. , Disp: , Rfl:    triamterene-hydrochlorothiazide (DYAZIDE) 37.5-25 MG per capsule, Take 1 capsule by mouth daily., Disp: , Rfl:   Past Medical History: Past Medical History:  Diagnosis Date   Anxiety    Arthritis    Asthma    Colon polyps    COPD (chronic obstructive pulmonary disease) ([place])    Depression    Diabetes ([place])    Difficult intubation    GERD (gastroesophageal reflux disease)    High cholesterol    Hyperlipidemia    Hypertension    Sleep apnea     Tobacco Use: Social History   Tobacco Use  Smoking Status Former Smoker   Packs/day: 1.00   Years: 15.00   Pack years: 15.00   Types: Cigarettes  Smokeless Tobacco Never Used  Tobacco Comment   quit 1977    Labs: Recent Review [facility] Cardiac and Pulmonary Rehab Latest Ref Rng & Units 06/26/2015 06/27/2015 05/08/2019 07/16/2020   Cholestrol 0 - 200 mg/dL - - - 179   LDLCALC 0 - 99 mg/dL - - - 87   HDL >40 mg/dL - - - 65   Trlycerides <150 mg/dL - - - 133   Hemoglobin A1c 4.8 - 5.6 % 5.9 6.1(H) 5.9(H) 7.1(H)       Exercise Target Goals: Exercise Program Goal: Individual exercise prescription set using results from initial 6 min walk test and THRR while considering  patients activity barriers and safety.   Exercise Prescription Goal: Initial exercise prescription builds to 30-45 minutes a day of aerobic activity, 2-3 days per week.  Home exercise guidelines will be given to patient during program as part of exercise prescription that the participant will acknowledge.   Education: Aerobic Exercise & Resistance  Training: - Gives group verbal and written instruction on the various components of exercise. Focuses on aerobic and resistive training programs and the benefits of this training and how to safely progress through these programs..   Cardiac Rehab from 07/17/2017 in [facility] Cardiac and Pulmonary Rehab  Date 07/05/17  Educator AS  Instruction Review Code (retired) 2- meets goals/outcomes      Education: Exercise & Equipment Safety: - Individual verbal instruction and demonstration of equipment use and safety with use of the equipment.   Cardiac Rehab from 08/26/2020 in [facility] Cardiac and Pulmonary Rehab  Date 06/08/20  Educator AS  Instruction Review Code 1- Verbalizes Understanding      Education: Exercise Physiology & General Exercise Guidelines: - Group verbal and written instruction with models to review the exercise physiology of the cardiovascular system and associated critical values. Provides general exercise guidelines with specific guidelines to those with heart or lung disease.    Cardiac Rehab from 08/26/2020 in [facility] Cardiac and Pulmonary Rehab  Date 07/08/20  Educator AS  Instruction Review Code 1- Verbalizes Understanding      Education: Flexibility, Balance, Mind/Body Relaxation: Provides group verbal/written instruction on the benefits of flexibility and balance training, including mind/body exercise modes such as yoga, pilates and tai chi.  Demonstration and skill practice provided.   Cardiac Rehab from 08/26/2020 in [facility] Cardiac and Pulmonary Rehab  Date 08/26/20  Educator AS  Instruction Review Code 1- Verbalizes Understanding      Activity Barriers & Risk Stratification:  Activity Barriers & Cardiac Risk Stratification - 05/28/20 1346      Activity Barriers & Cardiac Risk Stratification   Activity Barriers Chest Pain/Angina;Deconditioning    Cardiac Risk Stratification High           6 Minute Walk:  6 Minute Walk    Row Name 06/08/20 1624         6 Minute  Walk   Phase Initial     Distance 1300 feet     Walk Time 6 minutes     # of Rest Breaks 0     MPH 2.46     METS 2.26     RPE 17     Perceived Dyspnea  2     VO2 Peak 7.9     Symptoms Yes (comment)     Comments L side - has asthma - ha sspoken to Dr [name] - has treatment about 1x per year     Resting HR 87 bpm     Resting BP 112/64     Resting Oxygen Saturation  96 %     Exercise  Oxygen Saturation  during 6 min walk 96 %     Max Ex. HR 101 bpm     Max Ex. BP 112/66     2 Minute Post BP 112/68            Oxygen Initial Assessment:   Oxygen Re-Evaluation:   Oxygen Discharge (Final Oxygen Re-Evaluation):   Initial Exercise Prescription:  Initial Exercise Prescription - 06/08/20 1600      Date of Initial Exercise RX and Referring Provider   Date 06/08/20    Referring Provider [name]      Treadmill   MPH 2    Grade 0    Minutes 15    METs 2.5      Recumbant Bike   Level 1    RPM 60    Minutes 15    METs 2.5      NuStep   Level 1    SPM 80    Minutes 15    METs 2.5      Recumbant Elliptical   Level 1    RPM 50    Minutes 15    METs 2.5      REL-XR   Level 1    Speed 50    Minutes 15    METs 2.5      Prescription Details   Frequency (times per week) 3    Duration Progress to 30 minutes of continuous aerobic without signs/symptoms of physical distress      Intensity   THRR 40-80% of Max Heartrate 109-132    Ratings of Perceived Exertion 11-13    Perceived Dyspnea 0-4      Resistance Training   Training Prescription Yes    Weight 3 lb    Reps 10-15           Perform Capillary Blood Glucose checks as needed.  Exercise Prescription Changes:   Exercise Prescription Changes    Row Name 06/08/20 1600 06/15/20 1600 07/01/20 1600 07/13/20 1500 07/29/20 1400     Response to Exercise   Blood Pressure (Admit) 112/64 110/64 132/70 120/72 124/70   Blood Pressure (Exercise) 112/66 148/68 140/72 138/70 156/80   Blood Pressure (Exit) 112/68  140/80 112/68 124/70 100/60   Heart Rate (Admit) 87 bpm 84 bpm 115 bpm 102 bpm 98 bpm   Heart Rate (Exercise) 101 bpm 106 bpm 135 bpm 112 bpm 113 bpm   Heart Rate (Exit) 89 bpm 101 bpm 61 bpm 81 bpm 96 bpm   Oxygen Saturation (Admit) 96 % -- -- -- --   Oxygen Saturation (Exercise) 96 % -- -- -- --   Rating of Perceived Exertion (Exercise) _0 Perceived Dyspnea (Exercise) 2 -- -- -- --   Symptoms L side due to asthman none none none none   Comments -- 1st full day of exercise -- -- --   Duration -- Progress to 30 minutes of  aerobic without signs/symptoms of physical distress Continue with 30 min of aerobic exercise without signs/symptoms of physical distress. Continue with 30 min of aerobic exercise without signs/symptoms of physical distress. Continue with 30 min of aerobic exercise without signs/symptoms of physical distress.   Intensity -- THRR unchanged THRR unchanged THRR unchanged THRR unchanged     Progression   Progression -- Continue to progress workloads to maintain intensity without signs/symptoms of physical distress. Continue to progress workloads to maintain intensity without signs/symptoms of physical distress. Continue to progress workloads to maintain intensity without  signs/symptoms of physical distress. Continue to progress workloads to maintain intensity without signs/symptoms of physical distress.   Average METs -- 2.92 3.4 4.29 4.4     Resistance Training   Training Prescription -- Yes Yes Yes Yes   Weight -- 3 lb 3 lb 3 lb 3 lb   Reps -- 10-15 10-15 10-15 10-15     Interval Training   Interval Training -- No No No No     Treadmill   MPH -- 2 2.5 2.6 2.5   Grade -- 0 1.5 0 1.5   Minutes -- _0 METs -- 2.53 3.43 2.99 3.43     NuStep   Level -- -- -- 5 --   Minutes -- -- -- 15 --   METs -- -- -- 5.8 --     REL-XR   Level -- -- -- 6 6   Minutes -- -- -- 15 15   METs -- -- -- 3.4 --     T5 Nustep   Level -- 2 2 -- --   SPM -- -- 80  -- --   Minutes -- 15 15 -- --   METs -- 3.3 3.4 -- --     Biostep-RELP   Level -- -- -- 6 --   Minutes -- -- -- 15 --   METs -- -- -- 5 --   Row Name 08/12/20 1400 08/25/20 0800           Response to Exercise   Blood Pressure (Admit) 118/64 130/80      Blood Pressure (Exercise) 142/70 126/76      Blood Pressure (Exit) 118/60 110/70      Heart Rate (Admit) 113 bpm 91 bpm      Heart Rate (Exercise) 114 bpm 109 bpm      Heart Rate (Exit) 104 bpm 98 bpm      Rating of Perceived Exertion (Exercise) 15 16      Symptoms none none      Duration Continue with 30 min of aerobic exercise without signs/symptoms of physical distress. Continue with 30 min of aerobic exercise without signs/symptoms of physical distress.      Intensity THRR unchanged THRR unchanged        Progression   Progression Continue to progress workloads to maintain intensity without signs/symptoms of physical distress. Continue to progress workloads to maintain intensity without signs/symptoms of physical distress.      Average METs 4.75 4.17        Resistance Training   Training Prescription Yes Yes      Weight 6 lb 6 lb      Reps 10-15 10-15        Interval Training   Interval Training No No        Treadmill   MPH -- 2.5      Grade -- 1.5      Minutes -- 15      METs -- 3.43        NuStep   Level 4 5      SPM -- 80      Minutes 15 15      METs 5.5 4.9        Biostep-RELP   Level 4 --      Minutes 15 --      METs 4 --             Exercise Comments:   Exercise Goals and Review:   Exercise  Goals    Row Name 06/08/20 1630             Exercise Goals   Increase Physical Activity Yes       Intervention Provide advice, education, support and counseling about physical activity/exercise needs.;Develop an individualized exercise prescription for aerobic and resistive training based on initial evaluation findings, risk stratification, comorbidities and participant's personal goals.        Expected Outcomes Short Term: Attend rehab on a regular basis to increase amount of physical activity.;Long Term: Add in home exercise to make exercise part of routine and to increase amount of physical activity.;Long Term: Exercising regularly at least 3-5 days a week.       Increase Strength and Stamina Yes       Intervention Provide advice, education, support and counseling about physical activity/exercise needs.;Develop an individualized exercise prescription for aerobic and resistive training based on initial evaluation findings, risk stratification, comorbidities and participant's personal goals.       Expected Outcomes Short Term: Increase workloads from initial exercise prescription for resistance, speed, and METs.;Short Term: Perform resistance training exercises routinely during rehab and add in resistance training at home;Long Term: Improve cardiorespiratory fitness, muscular endurance and strength as measured by increased METs and functional capacity (6MWT)       Able to understand and use rate of perceived exertion (RPE) scale Yes       Intervention Provide education and explanation on how to use RPE scale       Expected Outcomes Short Term: Able to use RPE daily in rehab to express subjective intensity level;Long Term:  Able to use RPE to guide intensity level when exercising independently       Able to understand and use Dyspnea scale Yes       Intervention Provide education and explanation on how to use Dyspnea scale       Expected Outcomes Short Term: Able to use Dyspnea scale daily in rehab to express subjective sense of shortness of breath during exertion;Long Term: Able to use Dyspnea scale to guide intensity level when exercising independently       Knowledge and understanding of Target Heart Rate Range (THRR) Yes       Intervention Provide education and explanation of THRR including how the numbers were predicted and where they are located for reference       Expected Outcomes Short  Term: Able to state/look up THRR;Short Term: Able to use daily as guideline for intensity in rehab;Long Term: Able to use THRR to govern intensity when exercising independently       Able to check pulse independently Yes       Intervention Provide education and demonstration on how to check pulse in carotid and radial arteries.;Review the importance of being able to check your own pulse for safety during independent exercise       Expected Outcomes Short Term: Able to explain why pulse checking is important during independent exercise;Long Term: Able to check pulse independently and accurately       Understanding of Exercise Prescription Yes       Intervention Provide education, explanation, and written materials on patient's individual exercise prescription       Expected Outcomes Short Term: Able to explain program exercise prescription;Long Term: Able to explain home exercise prescription to exercise independently              Exercise Goals Re-Evaluation :  Exercise Goals Re-Evaluation    [place] Name 06/15/20 1534  06/25/20 1536 07/01/20 1632 07/13/20 1508 07/20/20 1610     Exercise Goal Re-Evaluation   Exercise Goals Review Increase Physical Activity;Able to understand and use rate of perceived exertion (RPE) scale;Knowledge and understanding of Target Heart Rate Range (THRR);Understanding of Exercise Prescription;Increase Strength and Stamina;Able to check pulse independently Increase Physical Activity;Increase Strength and Stamina Increase Physical Activity;Increase Strength and Stamina;Understanding of Exercise Prescription Increase Physical Activity;Increase Strength and Stamina;Understanding of Exercise Prescription Increase Physical Activity;Increase Strength and Stamina;Understanding of Exercise Prescription   Comments Reviewed RPE and dyspnea scales, THR and program prescription with pt today.  Pt voiced understanding and was given a copy of goals to take home. Patient has not been  exercising at home lately. The gyms that he was going to were closed and he has not been back because he is not comfortable with going. Shanon Brow is progressing well and has increased speed and grade on TM.  Staff will monitor progress. Shanon Brow continues to do well in rehab.  He is now up to level 6 on the XR and BioStep.  We will continue to monitor his progress. Shanon Brow feels exercise is good for him.  He can tell he has more stamina.  He has gone up to 5 lb for hand weights.   Expected Outcomes Short: Use RPE daily to regulate intensity. Long: Follow program prescription in THR. Short: review home exercise. Long: join a gym. Short: attend consistently Long:  improve stamina Short: Try to increase hand weights Long: Conitnue to improve stamina. Short: continue to exercise consistently Long:  continue to increase workloads   Row Name 07/29/20 1402 08/12/20 1446 08/20/20 1624 08/25/20 0830       Exercise Goal Re-Evaluation   Exercise Goals Review Increase Physical Activity;Increase Strength and Stamina;Understanding of Exercise Prescription Increase Physical Activity;Increase Strength and Stamina;Understanding of Exercise Prescription Increase Physical Activity;Increase Strength and Stamina;Understanding of Exercise Prescription Increase Physical Activity;Increase Strength and Stamina;Understanding of Exercise Prescription    Comments Shanon Brow attends consistently and works at [employer] 12-15.  Staff will monitor progress. Only one visit since last review.  Now out sick. Shanon Brow hasnt been doing structure exercise outside classes.  We reviewed getting steady cardio to stay in shape when he graduates [employer]. Shanon Brow has progressed well. He has increased incline on TM and is up to level 5 on NS.  He uses 6 lb weights for strength work.    Expected Outcomes Short: continue to attend consistently Long:  improve stamina Short: Clearance to return Long: Continue to improve stamina. Short:  get back to consistent attendance Long: improve  MET level Short: attend consistently Long: improve overall stamina           Discharge Exercise Prescription (Final Exercise Prescription Changes):  Exercise Prescription Changes - 08/25/20 0800      Response to Exercise   Blood Pressure (Admit) 130/80    Blood Pressure (Exercise) 126/76    Blood Pressure (Exit) 110/70    Heart Rate (Admit) 91 bpm    Heart Rate (Exercise) 109 bpm    Heart Rate (Exit) 98 bpm    Rating of Perceived Exertion (Exercise) 16    Symptoms none    Duration Continue with 30 min of aerobic exercise without signs/symptoms of physical distress.    Intensity THRR unchanged      Progression   Progression Continue to progress workloads to maintain intensity without signs/symptoms of physical distress.    Average METs 4.17      Resistance Training   Training Prescription Yes  Weight 6 lb    Reps 10-15      Interval Training   Interval Training No      Treadmill   MPH 2.5    Grade 1.5    Minutes 15    METs 3.43      NuStep   Level 5    SPM 80    Minutes 15    METs 4.9           Nutrition:  Target Goals: Understanding of nutrition guidelines, daily intake of sodium <1552m, cholesterol <2050m calories 30% from fat and 7% or less from saturated fats, daily to have 5 or more servings of fruits and vegetables.  Education: Controlling Sodium/Reading Food Labels -Group verbal and written material supporting the discussion of sodium use in heart healthy nutrition. Review and explanation with models, verbal and written materials for utilization of the food label.   [facility]  Date 06/26/17  Educator PI  Instruction Review Code (retired) 2- meets goals/outcomes      Education: General Nutrition Guidelines/Fats and Fiber: -Group instruction provided by verbal, written material, models and posters to present the general guidelines for heart healthy nutrition. Gives an explanation and review of  dietary fats and fiber.   [facility]  Date 06/19/17  Educator PI  Instruction Review Code (retired) 2- meets goals/outcomes      Biometrics:  Pre Biometrics - 06/08/20 1631      Pre Biometrics   Height 5' 5.25" (1.657 m)    Weight 176 lb 3.2 oz (79.9 kg)    BMI (Calculated) 29.11    Single Leg Stand 3.87 seconds            Nutrition Therapy Plan and Nutrition Goals:  Nutrition Therapy & Goals - 07/22/20 1614      Nutrition Therapy   Diet Heart healthy, Low Na, T2DM    Protein (specify units) 65g    Fiber 30 grams    Whole Grain Foods 3 servings    Saturated Fats 12 max. grams    Fruits and Vegetables 5 servings/day    Sodium 1.5 grams      Personal Nutrition Goals   Nutrition Goal ST: increase fiber, ex: adding oatmeal LT: wants to walk well again, get active again    Comments Well controlled T2DM. Pt reports Dr. MiSabra Heckook him off medication becuase he was board[place]line. B: boiled eggs cup of hot chocolate L: sandwich - may not eat S: pack of nabs with regular soda D: example last night. steak, pinto beans, squash and onions, cucumbers and tomatoes. Pt reports loving fruits and vegetables and even has a garden. 2% milk, unsweet tea with splenda is normally what he will drink.  Discussed heart healthy eating.      Intervention Plan   Intervention Prescribe, educate and counsel regarding individualized specific dietary modifications aiming towards targeted core components such as weight, hypertension, lipid management, diabetes, heart failure and other comorbidities.    Expected Outcomes Short Term Goal: Understand basic principles of dietary content, such as calories, fat, sodium, cholesterol and nutrients.;Short Term Goal: A plan has been developed with personal nutrition goals set during dietitian appointment.;Long Term Goal: Adherence to prescribed nutrition plan.           Nutrition Assessments:  Nutrition  Assessments - 06/08/20 1636      MEDFICTS Scores   Pre Score 60  MEDIFICTS Score Key:          ?70 Need to make dietary changes          40-70 Heart Healthy Diet         ? 40 Therapeutic Level Cholesterol Diet  Nutrition Goals Re-Evaluation:  Nutrition Goals Re-Evaluation    [place] Name 06/25/20 1542 08/26/20 1639           Goals   Current Weight 177 lb (80.3 kg) --      Nutrition Goal Lose more weight. ST: increase fiber, ex: adding oatmeal LT: wants to walk well again, get active again      Comment He states that he sometimes eats to much. He does not have an appetite like he used to. He wants to meet with the dietician to help gain more of an appetite. Continue with current changes      Expected Outcome Short: meet with dietician. Long: maintain a heart healthy diet and increase appetite. ST: increase fiber, ex: adding oatmeal LT: wants to walk well again, get active again             Nutrition Goals Discharge (Final Nutrition Goals Re-Evaluation):  Nutrition Goals Re-Evaluation - 08/26/20 1639      Goals   Nutrition Goal ST: increase fiber, ex: adding oatmeal LT: wants to walk well again, get active again    Comment Continue with current changes    Expected Outcome ST: increase fiber, ex: adding oatmeal LT: wants to walk well again, get active again           Psychosocial: Target Goals: Acknowledge presence or absence of significant depression and/or stress, maximize coping skills, provide positive support system. Participant is able to verbalize types and ability to use techniques and skills needed for reducing stress and depression.   Education: Depression - Provides group verbal and written instruction on the correlation between heart/lung disease and depressed mood, treatment options, and the stigmas associated with seeking treatment.   Cardiac Rehab from 08/26/2020 in [facility] Cardiac and Pulmonary Rehab  Date 07/29/20  Educator AS  Instruction Review Code  1- Verbalizes Understanding      Education: Sleep Hygiene -Provides group verbal and written instruction about how sleep can affect your health.  Define sleep hygiene, discuss sleep cycles and impact of sleep habits. Review good sleep hygiene tips.     Education: Stress and Anxiety: - Provides group verbal and written instruction about the health risks of elevated stress and causes of high stress.  Discuss the correlation between heart/lung disease and anxiety and treatment options. Review healthy ways to manage with stress and anxiety.   Cardiac Rehab from 08/26/2020 in [facility] Cardiac and Pulmonary Rehab  Date 07/29/20  Educator AS  Instruction Review Code 1- Verbalizes Understanding       Initial Review & Psychosocial Screening:  Initial Psych Review & Screening - 05/28/20 1348      Initial Review   Current issues with Current Stress Concerns    Comments anxiety about what is happening to our country. Concerned his memory is not good. Has med to help      Family Dynamics   Good Support System? Yes   wife, daughter,granddaughter lives at home with Blue, church family, neighbors     Barriers   Psychosocial barriers to participate in program There are no identifiable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

## 2020-09-07 ENCOUNTER — Encounter: Payer: Medicare Other | Admitting: *Deleted

## 2020-09-07 ENCOUNTER — Other Ambulatory Visit: Payer: Self-pay

## 2020-09-07 DIAGNOSIS — I208 Other forms of angina pectoris: Secondary | ICD-10-CM | POA: Diagnosis not present

## 2020-09-07 NOTE — Progress Notes (Signed)
Daily Session Note  Patient Details  Name: Brett Blake MRN: [id number] Date of Birth: 12-May-1941 Referring Provider:     Cardiac Rehab from 06/08/2020 in [facility] Cardiac and Pulmonary Rehab  Referring Provider [name]      Encounter Date: 09/07/2020  Check In:  Session Check In - 09/07/20 1608      Check-In   Supervising physician immediately available to respond to emergencies See telemetry face sheet for immediately available ER MD    Location [facility]-Cardiac & Pulmonary Rehab    Staff Present Renita Papa, RN Margurite Auerbach, MS Exercise Physiologist;Kelly Amedeo Plenty, BS, ACSM CEP, Exercise Physiologist    Virtual Visit No    Medication changes reported     No    Fall or balance concerns reported    No    Warm-up and Cool-down Performed on first and last piece of equipment    Resistance Training Performed Yes    VAD Patient? No    PAD/SET Patient? No      Pain Assessment   Currently in Pain? No/denies              Social History   Tobacco Use  Smoking Status Former Smoker  . Packs/day: 1.00  . Years: 15.00  . Pack years: 15.00  . Types: Cigarettes  Smokeless Tobacco Never Used  Tobacco Comment   quit 1977    Goals Met:  Independence with exercise equipment Exercise tolerated well No report of cardiac concerns or symptoms Strength training completed today  Goals Unmet:  N/A   Comments: Pt able to follow exercise prescription today without complaint.  Will continue to monitor for progression.  Reviewed home exercise with pt today.  Pt plans to walk and attend the [facility] for exercise.  Reviewed THR, pulse, RPE, sign and symptoms, pulse oximetery and when to call 911 or MD.  Also discussed weather considerations and indoor options.  Pt voiced understanding.   Dr. Emily Filbert is Medical Director for [place] and [facility].

## 2020-09-09 ENCOUNTER — Encounter: Payer: Medicare Other | Admitting: *Deleted

## 2020-09-09 ENCOUNTER — Other Ambulatory Visit: Payer: Self-pay

## 2020-09-09 DIAGNOSIS — I208 Other forms of angina pectoris: Secondary | ICD-10-CM | POA: Diagnosis not present

## 2020-09-09 NOTE — Progress Notes (Signed)
Daily Session Note  Patient Details  Name: Brett Blake MRN: [id number] Date of Birth: Aug 09, 1941 Referring Provider:     Cardiac Rehab from 06/08/2020 in [facility] Cardiac and Pulmonary Rehab  Referring Provider [name]      Encounter Date: 09/09/2020  Check In:  Session Check In - 09/09/20 1606      Check-In   Supervising physician immediately available to respond to emergencies See telemetry face sheet for immediately available ER MD    Location [facility]-Cardiac & Pulmonary Rehab    Staff Present Renita Papa, RN BSN;Joseph Lou Miner, [place] Exercise Physiologist;Melissa Tilford Pillar RDN, LDN    Virtual Visit No    Medication changes reported     No    Fall or balance concerns reported    No    Warm-up and Cool-down Performed on first and last piece of equipment    Resistance Training Performed Yes    VAD Patient? No    PAD/SET Patient? No      Pain Assessment   Currently in Pain? No/denies              Social History   Tobacco Use  Smoking Status Former Smoker  . Packs/day: 1.00  . Years: 15.00  . Pack years: 15.00  . Types: Cigarettes  Smokeless Tobacco Never Used  Tobacco Comment   quit 1977    Goals Met:  Independence with exercise equipment Exercise tolerated well No report of cardiac concerns or symptoms Strength training completed today  Goals Unmet:  Not Applicable  Comments: Pt able to follow exercise prescription today without complaint.  Will continue to monitor for progression.    Dr. Emily Filbert is Medical Director for [place] and [facility].

## 2020-09-14 ENCOUNTER — Encounter: Payer: Medicare Other | Admitting: *Deleted

## 2020-09-14 ENCOUNTER — Other Ambulatory Visit: Payer: Self-pay

## 2020-09-14 DIAGNOSIS — I208 Other forms of angina pectoris: Secondary | ICD-10-CM

## 2020-09-14 NOTE — Progress Notes (Signed)
Daily Session Note  Patient Details  Name: Brett Blake MRN: [id number] Date of Birth: 1941/08/03 Referring Provider:     Cardiac Rehab from 06/08/2020 in [facility] Cardiac and Pulmonary Rehab  Referring Provider [name]      Encounter Date: 09/14/2020  Check In:  Session Check In - 09/14/20 [place]      Check-In   Supervising physician immediately available to respond to emergencies See telemetry face sheet for immediately available ER MD    Location [facility]-Cardiac & Pulmonary Rehab    Staff Present Earlean Shawl, BS, ACSM CEP, Exercise Physiologist;Blaiden Werth Sherryll Burger, RN Margurite Auerbach, MS Exercise Physiologist    Virtual Visit No    Medication changes reported     No    Fall or balance concerns reported    No    Warm-up and Cool-down Performed on first and last piece of equipment    Resistance Training Performed Yes    VAD Patient? No    PAD/SET Patient? No      Pain Assessment   Currently in Pain? No/denies              Social History   Tobacco Use  Smoking Status Former Smoker  . Packs/day: 1.00  . Years: 15.00  . Pack years: 15.00  . Types: Cigarettes  Smokeless Tobacco Never Used  Tobacco Comment   quit 1977    Goals Met:  Independence with exercise equipment Exercise tolerated well No report of cardiac concerns or symptoms Strength training completed today  Goals Unmet:  Not Applicable  Comments: Pt able to follow exercise prescription today without complaint.  Will continue to monitor for progression.    Dr. Emily Filbert is Medical Director for [place] and [facility].

## 2020-09-15 ENCOUNTER — Ambulatory Visit
Admission: RE | Admit: 2020-09-15 | Discharge: 2020-09-15 | Disposition: A | Discharge status: Home / Self Care | Payer: Medicare Other | Source: Ambulatory Visit | Attending: Neurology | Admitting: Neurology

## 2020-09-15 DIAGNOSIS — R569 Unspecified convulsions: Secondary | ICD-10-CM | POA: Diagnosis present

## 2020-09-16 ENCOUNTER — Other Ambulatory Visit: Payer: Self-pay

## 2020-09-16 ENCOUNTER — Encounter: Payer: Medicare Other | Admitting: *Deleted

## 2020-09-16 DIAGNOSIS — I208 Other forms of angina pectoris: Secondary | ICD-10-CM | POA: Diagnosis not present

## 2020-09-16 NOTE — Progress Notes (Signed)
Daily Session Note  Patient Details  Name: Brett Blake MRN: [id number] Date of Birth: [birth year] Referring Provider:     Cardiac Rehab from 06/08/2020 in [facility]  Referring Provider [name]      Encounter Date: 09/16/2020  Check In:  Session Check In - 09/16/20 1556      Check-In   Supervising physician immediately available to respond to emergencies See telemetry face sheet for immediately available ER MD    Location [facility]-Cardiac [facility] Pulmonary Rehab    Staff Present Justin Mend RCP,RRT,BSRT;Laurie Lovejoy Sherryll Burger, RN Margurite Auerbach, MS Exercise Physiologist    Virtual Visit No    Medication changes reported     No    Fall or balance concerns reported    No    Warm-up and Cool-down Performed on first and last piece of equipment    Resistance Training Performed Yes    VAD Patient? No    PAD/SET Patient? No      Pain Assessment   Currently in Pain? No/denies              Social History   Tobacco Use  Smoking Status Former Smoker  . Packs/day: 1.00  . Years: 15.00  . Pack years: 15.00  . Types: Cigarettes  Smokeless Tobacco Never Used  Tobacco Comment   quit 1977    Goals Met:  Independence with exercise equipment Exercise tolerated well No report of cardiac concerns or symptoms Strength training completed today  Goals Unmet:  Not Applicable  Comments: Pt able to follow exercise prescription today without complaint.  Will continue to monitor for progression.    Dr. Emily Filbert is Medical Director for [facility].

## 2020-09-17 ENCOUNTER — Other Ambulatory Visit: Payer: Self-pay

## 2020-09-17 ENCOUNTER — Encounter: Payer: Medicare Other | Admitting: *Deleted

## 2020-09-17 DIAGNOSIS — I208 Other forms of angina pectoris: Secondary | ICD-10-CM

## 2020-09-17 NOTE — Patient Instructions (Addendum)
Discharge Patient Instructions  Patient Details  Name: Brett Blake MRN: [id number] Date of Birth: Feb 02, 1941 Referring Provider:  [name], MD   Number of Visits: 36  Reason for Discharge:  Patient has met program and personal goals.  Smoking History:  Social History   Tobacco Use  Smoking Status Former Smoker  . Packs/day: 1.00  . Years: 15.00  . Pack years: 15.00  . Types: Cigarettes  Smokeless Tobacco Never Used  Tobacco Comment   quit 1977    Diagnosis:  Chronic stable angina [facility])  Initial Exercise Prescription:  Initial Exercise Prescription - 06/08/20 1600      Date of Initial Exercise RX and Referring Provider   Date 06/08/20    Referring Provider [name]      Treadmill   MPH 2    Grade 0    Minutes 15    METs 2.5      Recumbant Bike   Level 1    RPM 60    Minutes 15    METs 2.5      NuStep   Level 1    SPM 80    Minutes 15    METs 2.5      Recumbant Elliptical   Level 1    RPM 50    Minutes 15    METs 2.5      REL-XR   Level 1    Speed 50    Minutes 15    METs 2.5      Prescription Details   Frequency (times per week) 3    Duration Progress to 30 minutes of continuous aerobic without signs/symptoms of physical distress      Intensity   THRR 40-80% of Max Heartrate 109-132    Ratings of Perceived Exertion 11-13    Perceived Dyspnea 0-4      Resistance Training   Training Prescription Yes    Weight 3 lb    Reps 10-15           Discharge Exercise Prescription (Final Exercise Prescription Changes):  Exercise Prescription Changes - 09/09/20 0900      Response to Exercise   Blood Pressure (Admit) 100/58    Blood Pressure (Exercise) 112/58    Blood Pressure (Exit) 100/60    Heart Rate (Admit) 108 bpm    Heart Rate (Exercise) 119 bpm    Heart Rate (Exit) 100 bpm    Rating of Perceived Exertion (Exercise) 16    Symptoms none    Duration Continue with 30 min of aerobic exercise without signs/symptoms of physical  distress.    Intensity THRR unchanged      Progression   Progression Continue to progress workloads to maintain intensity without signs/symptoms of physical distress.    Average METs 3.98      Resistance Training   Training Prescription Yes    Weight 6 lb    Reps 10-15      Interval Training   Interval Training No      Treadmill   MPH 2.5    Grade 1.5    Minutes 15    METs 3.43      NuStep   Level 5    Minutes 15    METs 5.1      REL-XR   Level 6    Minutes 15    METs 3.4      Biostep-RELP   Level 6    Minutes 15    METs 4  Home Exercise Plan   Plans to continue exercise at [facility]   Walking at home, [place] at [place] 2 additional days to program exercise sessions.    Initial Home Exercises Provided 09/07/20           Functional Capacity:  6 Minute Walk    Row Name 06/08/20 1624 09/09/20 1618       6 Minute Walk   Phase Initial Discharge    Distance 1300 feet 1690 feet    Distance % Change -- 30 %    Distance Feet Change -- 390 ft    Walk Time 6 minutes 6 minutes    # of Rest Breaks 0 0    MPH 2.46 3.2    METS 2.26 3.17    RPE 17 17    Perceived Dyspnea  2 1    VO2 Peak 7.9 11.1    Symptoms Yes (comment) No    Comments L side - has asthma - ha sspoken to Dr [name] - has treatment about 1x per year --    Resting HR 87 bpm 95 bpm    Resting BP 112/64 104/58    Resting Oxygen Saturation  96 % --    Exercise Oxygen Saturation  during 6 min walk 96 % --    Max Ex. HR 101 bpm 116 bpm    Max Ex. BP 112/66 124/54    2 Minute Post BP 112/68 --             Personal Goals Discharge:  Goals and Risk Factor Review - 08/20/20 1617      Core Components/Risk Factors/Patient Goals Review   Personal Goals Review Weight Management/Obesity;Diabetes;Lipids;Hypertension    Review Shanon Brow still needs to contact Dr about getting a new glucometer.  He does monitor BP at home.  Shanon Brow will be having a CT scan to figure out why he has  passed out.  He has been feeling better since he went on a new medication prescribed by Dr Saralyn Pilar.    Expected Outcomes Short: follow up with apoointments Long: manage risk factors long term            Nutrition & Weight - Outcomes:  Pre Biometrics - 06/08/20 1631      Pre Biometrics   Height 5' 5.25" (1.657 m)    Weight 176 lb 3.2 oz (79.9 kg)    BMI (Calculated) 29.11    Single Leg Stand 3.87 seconds            Nutrition:  Nutrition Therapy & Goals - 07/22/20 1614      Nutrition Therapy   Diet Heart healthy, Low Na, T2DM    Protein (specify units) 65g    Fiber 30 grams    Whole Grain Foods 3 servings    Saturated Fats 12 max. grams    Fruits and Vegetables 5 servings/day    Sodium 1.5 grams      Personal Nutrition Goals   Nutrition Goal ST: increase fiber, ex: adding oatmeal LT: wants to walk well again, get active again    Comments Well controlled T2DM. Pt reports Dr. [name] took him off medication becuase he was boarderline. B: boiled eggs cup of hot chocolate L: sandwich - may not eat S: pack of nabs with regular soda D: example last night. steak, pinto beans, squash and onions, cucumbers and tomatoes. Pt reports loving fruits and vegetables and even has a garden. 2% milk, unsweet tea with splenda  is normally what he will drink.  Discussed heart healthy eating.      Intervention Plan   Intervention Prescribe, educate and counsel regarding individualized specific dietary modifications aiming towards targeted core components such as weight, hypertension, lipid management, diabetes, heart failure and other comorbidities.    Expected Outcomes Short Term Goal: Understand basic principles of dietary content, such as calories, fat, sodium, cholesterol and nutrients.;Short Term Goal: A plan has been developed with personal nutrition goals set during dietitian appointment.;Long Term Goal: Adherence to prescribed nutrition plan.           Nutrition Discharge:  Nutrition  Assessments - 06/08/20 1636      MEDFICTS Scores   Pre Score 60           Education Questionnaire Score:  Knowledge Questionnaire Score - 09/14/20 1649      Knowledge Questionnaire Score   Post Score 25/26           Goals reviewed with patient; copy given to patient.

## 2020-09-17 NOTE — Progress Notes (Signed)
Cardiac Individual Treatment Plan  Patient Details  Name: Brett Blake MRN: [id number] Date of Birth: 1941-07-12 Referring Provider:     Cardiac Rehab from 06/08/2020 in [facility] Cardiac and Pulmonary Rehab  Referring Provider [name]      Initial Encounter Date:    Cardiac Rehab from 06/08/2020 in [facility] Cardiac and Pulmonary Rehab  Date 06/08/20      Visit Diagnosis: Chronic stable angina ([place])  Patient's Home Medications on Admission:  Current Outpatient Medications:  .  albuterol (ACCUNEB) 1.25 MG/3ML nebulizer solution, TAKE 3 MLS (1.25 MG TOTAL) BY NEBULIZATION EVERY 6 (SIX) HOURS AS NEEDED FOR WHEEZING, Disp: , Rfl:  .  ALPRAZolam (XANAX) 0.5 MG tablet, Take 0.5 tablets by mouth daily as needed., Disp: , Rfl:  .  amitriptyline (ELAVIL) 10 MG tablet, Take 10 mg by mouth at bedtime., Disp: , Rfl:  .  aspirin 81 MG chewable tablet, Chew 81 mg by mouth daily., Disp: , Rfl:  .  bisoprolol (ZEBETA) 5 MG tablet, Take 2.5 mg by mouth 2 (two) times daily., Disp: , Rfl:  .  diltiazem (CARDIZEM CD) 180 MG 24 hr capsule, Take 1 capsule by mouth 2 (two) times a day., Disp: , Rfl:  .  donepezil (ARICEPT) 5 MG tablet, Take 5 mg by mouth at bedtime. , Disp: , Rfl:  .  famotidine (PEPCID) 40 MG tablet, Take 40 mg by mouth at bedtime., Disp: , Rfl:  .  fluticasone (FLONASE) 50 MCG/ACT nasal spray, Place 1 spray into both nostrils daily as needed for allergies or rhinitis., Disp: , Rfl:  .  glucosamine-chondroitin 500-400 MG tablet, Take 1 tablet by mouth daily., Disp: , Rfl:  .  ibuprofen (ADVIL,MOTRIN) 200 MG tablet, Take 200 mg by mouth every 6 (six) hours as needed., Disp: , Rfl:  .  isosorbide mononitrate (IMDUR) 30 MG 24 hr tablet, Take 30 mg by mouth daily., Disp: , Rfl:  .  montelukast (SINGULAIR) 10 MG tablet, Take 10 mg by mouth at bedtime., Disp: , Rfl:  .  Multiple Vitamins-Minerals (MULTIVITAMIN PO), Take 1 tablet by mouth daily., Disp: , Rfl:  .  nitroGLYCERIN (NITROSTAT) 0.4 MG SL  tablet, Place 0.4 mg under the tongue every 5 (five) minutes as needed for chest pain., Disp: , Rfl:  .  omeprazole (PRILOSEC) 40 MG capsule, Take 40 mg by mouth daily., Disp: , Rfl:  .  simvastatin (ZOCOR) 20 MG tablet, Take 20 mg by mouth daily., Disp: , Rfl:  .  SYMBICORT 80-4.5 MCG/ACT inhaler, Inhale 2 puffs into the lungs 2 (two) times daily as needed. , Disp: , Rfl:  .  triamterene-hydrochlorothiazide (DYAZIDE) 37.5-25 MG per capsule, Take 1 capsule by mouth daily., Disp: , Rfl:   Past Medical History: Past Medical History:  Diagnosis Date  . Anxiety   . Arthritis   . Asthma   . Colon polyps   . COPD (chronic obstructive pulmonary disease) ([place])   . Depression   . Diabetes ([place])   . Difficult intubation   . GERD (gastroesophageal reflux disease)   . High cholesterol   . Hyperlipidemia   . Hypertension   . Sleep apnea     Tobacco Use: Social History   Tobacco Use  Smoking Status Former Smoker  . Packs/day: 1.00  . Years: 15.00  . Pack years: 15.00  . Types: Cigarettes  Smokeless Tobacco Never Used  Tobacco Comment   quit 1977    Labs: Recent Review Flowsheet Data    Labs for  ITP Cardiac and Pulmonary Rehab Latest Ref Rng & Units 06/26/2015 06/27/2015 05/08/2019 07/16/2020   Cholestrol 0 - 200 mg/dL - - - 179   LDLCALC 0 - 99 mg/dL - - - 87   HDL >40 mg/dL - - - 65   Trlycerides <150 mg/dL - - - 133   Hemoglobin A1c 4.8 - 5.6 % 5.9 6.1(H) 5.9(H) 7.1(H)       Exercise Target Goals: Exercise Program Goal: Individual exercise prescription set using results from initial 6 min walk test and THRR while considering  patient's activity barriers and safety.   Exercise Prescription Goal: Initial exercise prescription builds to 30-45 minutes a day of aerobic activity, 2-3 days per week.  Home exercise guidelines will be given to patient during program as part of exercise prescription that the participant will acknowledge.   Education: Aerobic Exercise & Resistance  Training: - Gives group verbal and written instruction on the various components of exercise. Focuses on aerobic and resistive training programs and the benefits of this training and how to safely progress through these programs..   Cardiac Rehab from 07/17/2017 in [facility] Cardiac and Pulmonary Rehab  Date 07/05/17  Educator AS  Instruction Review Code (retired) 2- meets goals/outcomes      Education: Exercise & Equipment Safety: - Individual verbal instruction and demonstration of equipment use and safety with use of the equipment.   Cardiac Rehab from 09/16/2020 in [facility] Cardiac and Pulmonary Rehab  Date 06/08/20  Educator AS  Instruction Review Code 1- Verbalizes Understanding      Education: Exercise Physiology & General Exercise Guidelines: - Group verbal and written instruction with models to review the exercise physiology of the cardiovascular system and associated critical values. Provides general exercise guidelines with specific guidelines to those with heart or lung disease.    Cardiac Rehab from 09/16/2020 in [facility] Cardiac and Pulmonary Rehab  Date 07/08/20  Educator AS  Instruction Review Code 1- Verbalizes Understanding      Education: Flexibility, Balance, Mind/Body Relaxation: Provides group verbal/written instruction on the benefits of flexibility and balance training, including mind/body exercise modes such as yoga, pilates and tai chi.  Demonstration and skill practice provided.   Cardiac Rehab from 09/16/2020 in [facility] Cardiac and Pulmonary Rehab  Date 08/26/20  Educator AS  Instruction Review Code 1- Verbalizes Understanding      Activity Barriers & Risk Stratification:  Activity Barriers & Cardiac Risk Stratification - 05/28/20 1346      Activity Barriers & Cardiac Risk Stratification   Activity Barriers Chest Pain/Angina;Deconditioning    Cardiac Risk Stratification High           6 Minute Walk:  6 Minute Walk    Row Name 06/08/20 1624 09/09/20 1618         6 Minute Walk   Phase Initial Discharge    Distance 1300 feet 1690 feet    Distance % Change -- 30 %    Distance Feet Change -- 390 ft    Walk Time 6 minutes 6 minutes    # of Rest Breaks 0 0    MPH 2.46 3.2    METS 2.26 3.17    RPE 17 17    Perceived Dyspnea  2 1    VO2 Peak 7.9 11.1    Symptoms Yes (comment) No    Comments L side - has asthma - ha sspoken to Dr [name] - has treatment about 1x per year --    Resting HR 87 bpm 95  bpm    Resting BP 112/64 104/58    Resting Oxygen Saturation  96 % --    Exercise Oxygen Saturation  during 6 min walk 96 % --    Max Ex. HR 101 bpm 116 bpm    Max Ex. BP 112/66 124/54    2 Minute Post BP 112/68 --           Oxygen Initial Assessment:   Oxygen Re-Evaluation:   Oxygen Discharge (Final Oxygen Re-Evaluation):   Initial Exercise Prescription:  Initial Exercise Prescription - 06/08/20 1600      Date of Initial Exercise RX and Referring Provider   Date 06/08/20    Referring Provider [name]      Treadmill   MPH 2    Grade 0    Minutes 15    METs 2.5      Recumbant Bike   Level 1    RPM 60    Minutes 15    METs 2.5      NuStep   Level 1    SPM 80    Minutes 15    METs 2.5      Recumbant Elliptical   Level 1    RPM 50    Minutes 15    METs 2.5      REL-XR   Level 1    Speed 50    Minutes 15    METs 2.5      Prescription Details   Frequency (times per week) 3    Duration Progress to 30 minutes of continuous aerobic without signs/symptoms of physical distress      Intensity   THRR 40-80% of Max Heartrate 109-132    Ratings of Perceived Exertion 11-13    Perceived Dyspnea 0-4      Resistance Training   Training Prescription Yes    Weight 3 lb    Reps 10-15           Perform Capillary Blood Glucose checks as needed.  Exercise Prescription Changes:  Exercise Prescription Changes    Row Name 06/08/20 1600 06/15/20 1600 07/01/20 1600 07/13/20 1500 07/29/20 1400     Response to Exercise    Blood Pressure (Admit) 112/64 110/64 132/70 120/72 124/70   Blood Pressure (Exercise) 112/66 148/68 140/72 138/70 156/80   Blood Pressure (Exit) 112/68 140/80 112/68 124/70 100/60   Heart Rate (Admit) 87 bpm 84 bpm 115 bpm 102 bpm 98 bpm   Heart Rate (Exercise) 101 bpm 106 bpm 135 bpm 112 bpm 113 bpm   Heart Rate (Exit) 89 bpm 101 bpm 61 bpm 81 bpm 96 bpm   Oxygen Saturation (Admit) 96 % -- -- -- --   Oxygen Saturation (Exercise) 96 % -- -- -- --   Rating of Perceived Exertion (Exercise) '17 15 15 13 14   ' Perceived Dyspnea (Exercise) 2 -- -- -- --   Symptoms L side due to asthman none none none none   Comments -- 1st full day of exercise -- -- --   Duration -- Progress to 30 minutes of  aerobic without signs/symptoms of physical distress Continue with 30 min of aerobic exercise without signs/symptoms of physical distress. Continue with 30 min of aerobic exercise without signs/symptoms of physical distress. Continue with 30 min of aerobic exercise without signs/symptoms of physical distress.   Intensity -- THRR unchanged THRR unchanged THRR unchanged THRR unchanged     Progression   Progression -- Continue to progress workloads to maintain intensity without signs/symptoms of  physical distress. Continue to progress workloads to maintain intensity without signs/symptoms of physical distress. Continue to progress workloads to maintain intensity without signs/symptoms of physical distress. Continue to progress workloads to maintain intensity without signs/symptoms of physical distress.   Average METs -- 2.92 3.4 4.29 4.4     Resistance Training   Training Prescription -- Yes Yes Yes Yes   Weight -- 3 lb 3 lb 3 lb 3 lb   Reps -- 10-15 10-15 10-15 10-15     Interval Training   Interval Training -- No No No No     Treadmill   MPH -- 2 2.5 2.6 2.5   Grade -- 0 1.5 0 1.5   Minutes -- '15 15 15 15   ' METs -- 2.53 3.43 2.99 3.43     NuStep   Level -- -- -- 5 --   Minutes -- -- -- 15 --   METs --  -- -- 5.8 --     REL-XR   Level -- -- -- 6 6   Minutes -- -- -- 15 15   METs -- -- -- 3.4 --     T5 Nustep   Level -- 2 2 -- --   SPM -- -- 80 -- --   Minutes -- 15 15 -- --   METs -- 3.3 3.4 -- --     Biostep-RELP   Level -- -- -- 6 --   Minutes -- -- -- 15 --   METs -- -- -- 5 --   Row Name 08/12/20 1400 08/25/20 0800 09/07/20 1700 09/09/20 0900       Response to Exercise   Blood Pressure (Admit) 118/64 130/80 -- 100/58    Blood Pressure (Exercise) 142/70 126/76 -- 112/58    Blood Pressure (Exit) 118/60 110/70 -- 100/60    Heart Rate (Admit) 113 bpm 91 bpm -- 108 bpm    Heart Rate (Exercise) 114 bpm 109 bpm -- 119 bpm    Heart Rate (Exit) 104 bpm 98 bpm -- 100 bpm    Rating of Perceived Exertion (Exercise) 15 16 -- 16    Symptoms none none -- none    Duration Continue with 30 min of aerobic exercise without signs/symptoms of physical distress. Continue with 30 min of aerobic exercise without signs/symptoms of physical distress. -- Continue with 30 min of aerobic exercise without signs/symptoms of physical distress.    Intensity THRR unchanged THRR unchanged -- THRR unchanged      Progression   Progression Continue to progress workloads to maintain intensity without signs/symptoms of physical distress. Continue to progress workloads to maintain intensity without signs/symptoms of physical distress. -- Continue to progress workloads to maintain intensity without signs/symptoms of physical distress.    Average METs 4.75 4.17 -- 3.98      Resistance Training   Training Prescription Yes Yes -- Yes    Weight 6 lb 6 lb -- 6 lb    Reps 10-15 10-15 -- 10-15      Interval Training   Interval Training No No -- No      Treadmill   MPH -- 2.5 -- 2.5    Grade -- 1.5 -- 1.5    Minutes -- 15 -- 15    METs -- 3.43 -- 3.43      NuStep   Level 4 5 -- 5    SPM -- 80 -- --    Minutes 15 15 -- 15    METs 5.5 4.9 -- 5.1  REL-XR   Level -- -- -- 6    Minutes -- -- -- 15     METs -- -- -- 3.4      Biostep-RELP   Level 4 -- -- 6    Minutes 15 -- -- 15    METs 4 -- -- 4      Home Exercise Plan   Plans to continue exercise at -- -- [facility]  Walking at home, [place] at [facility]  Walking at home, [place] at [facility]    Frequency -- -- Add 2 additional days to program exercise sessions. Add 2 additional days to program exercise sessions.    Initial Home Exercises Provided -- -- 09/07/20 09/07/20           Exercise Comments:   Exercise Goals and Review:  Exercise Goals    Row Name 06/08/20 1630             Exercise Goals   Increase Physical Activity Yes       Intervention Provide advice, education, support and counseling about physical activity/exercise needs.;Develop an individualized exercise prescription for aerobic and resistive training based on initial evaluation findings, risk stratification, comorbidities and participant's personal goals.       Expected Outcomes Short Term: Attend rehab on a regular basis to increase amount of physical activity.;Long Term: Add in home exercise to make exercise part of routine and to increase amount of physical activity.;Long Term: Exercising regularly at least 3-5 days a week.       Increase Strength and Stamina Yes       Intervention Provide advice, education, support and counseling about physical activity/exercise needs.;Develop an individualized exercise prescription for aerobic and resistive training based on initial evaluation findings, risk stratification, comorbidities and participant's personal goals.       Expected Outcomes Short Term: Increase workloads from initial exercise prescription for resistance, speed, and METs.;Short Term: Perform resistance training exercises routinely during rehab and add in resistance training at home;Long Term: Improve cardiorespiratory fitness, muscular endurance and strength as measured by increased METs and functional capacity (6MWT)       Able to  understand and use rate of perceived exertion (RPE) scale Yes       Intervention Provide education and explanation on how to use RPE scale       Expected Outcomes Short Term: Able to use RPE daily in rehab to express subjective intensity level;Long Term:  Able to use RPE to guide intensity level when exercising independently       Able to understand and use Dyspnea scale Yes       Intervention Provide education and explanation on how to use Dyspnea scale       Expected Outcomes Short Term: Able to use Dyspnea scale daily in rehab to express subjective sense of shortness of breath during exertion;Long Term: Able to use Dyspnea scale to guide intensity level when exercising independently       Knowledge and understanding of Target Heart Rate Range (THRR) Yes       Intervention Provide education and explanation of THRR including how the numbers were predicted and where they are located for reference       Expected Outcomes Short Term: Able to state/look up THRR;Short Term: Able to use daily as guideline for intensity in rehab;Long Term: Able to use THRR to govern intensity when exercising independently       Able to check pulse independently Yes  Intervention Provide education and demonstration on how to check pulse in carotid and radial arteries.;Review the importance of being able to check your own pulse for safety during independent exercise       Expected Outcomes Short Term: Able to explain why pulse checking is important during independent exercise;Long Term: Able to check pulse independently and accurately       Understanding of Exercise Prescription Yes       Intervention Provide education, explanation, and written materials on patient's individual exercise prescription       Expected Outcomes Short Term: Able to explain program exercise prescription;Long Term: Able to explain home exercise prescription to exercise independently              Exercise Goals Re-Evaluation :  Exercise  Goals Re-Evaluation    Row Name 06/15/20 1534 06/25/20 1536 07/01/20 1632 07/13/20 1508 07/20/20 1610     Exercise Goal Re-Evaluation   Exercise Goals Review Increase Physical Activity;Able to understand and use rate of perceived exertion (RPE) scale;Knowledge and understanding of Target Heart Rate Range (THRR);Understanding of Exercise Prescription;Increase Strength and Stamina;Able to check pulse independently Increase Physical Activity;Increase Strength and Stamina Increase Physical Activity;Increase Strength and Stamina;Understanding of Exercise Prescription Increase Physical Activity;Increase Strength and Stamina;Understanding of Exercise Prescription Increase Physical Activity;Increase Strength and Stamina;Understanding of Exercise Prescription   Comments Reviewed RPE and dyspnea scales, THR and program prescription with pt today.  Pt voiced understanding and was given a copy of goals to take home. Patient has not been exercising at home lately. The gyms that he was going to were closed and he has not been back because he is not comfortable with going. Brett Blake is progressing well and has increased speed and grade on TM.  Staff will monitor progress. Brett Blake continues to do well in rehab.  He is now up to level 6 on the XR and BioStep.  We will continue to monitor his progress. Brett Blake feels exercise is good for him.  He can tell he has more stamina.  He has gone up to 5 lb for hand weights.   Expected Outcomes Short: Use RPE daily to regulate intensity. Long: Follow program prescription in THR. Short: review home exercise. Long: join a gym. Short: attend consistently Long:  improve stamina Short: Try to increase hand weights Long: Conitnue to improve stamina. Short: continue to exercise consistently Long:  continue to increase workloads   Row Name 07/29/20 1402 08/12/20 1446 08/20/20 1624 08/25/20 0830 09/09/20 0950     Exercise Goal Re-Evaluation   Exercise Goals Review Increase Physical Activity;Increase  Strength and Stamina;Understanding of Exercise Prescription Increase Physical Activity;Increase Strength and Stamina;Understanding of Exercise Prescription Increase Physical Activity;Increase Strength and Stamina;Understanding of Exercise Prescription Increase Physical Activity;Increase Strength and Stamina;Understanding of Exercise Prescription Increase Physical Activity;Increase Strength and Stamina;Understanding of Exercise Prescription   Comments Brett Blake attends consistently and works at [employer] 12-15.  Staff will monitor progress. Only one visit since last review.  Now out sick. Brett Blake hasnt been doing structure exercise outside classes.  We reviewed getting steady cardio to stay in shape when he graduates [employer]. Brett Blake has progressed well. He has increased incline on TM and is up to level 5 on NS.  He uses 6 lb weights for strength work. Brett Blake is doing well in rehab.  He is now up to level 6 on the BioStep. He is nearing graduation and we expect to see an improvement in his 6MWT.  He is planning to continue to exercise by walking at  home. We will continue to monitor his progress.   Expected Outcomes Short: continue to attend consistently Long:  improve stamina Short: Clearance to return Long: Continue to improve stamina. Short:  get back to consistent attendance Long: improve MET level Short: attend consistently Long: improve overall stamina Short: Improve 6MWT  Long: Continue to exericse independently          Discharge Exercise Prescription (Final Exercise Prescription Changes):  Exercise Prescription Changes - 09/09/20 0900      Response to Exercise   Blood Pressure (Admit) 100/58    Blood Pressure (Exercise) 112/58    Blood Pressure (Exit) 100/60    Heart Rate (Admit) 108 bpm    Heart Rate (Exercise) 119 bpm    Heart Rate (Exit) 100 bpm    Rating of Perceived Exertion (Exercise) 16    Symptoms none    Duration Continue with 30 min of aerobic exercise without signs/symptoms of physical distress.     Intensity THRR unchanged      Progression   Progression Continue to progress workloads to maintain intensity without signs/symptoms of physical distress.    Average METs 3.98      Resistance Training   Training Prescription Yes    Weight 6 lb    Reps 10-15      Interval Training   Interval Training No      Treadmill   MPH 2.5    Grade 1.5    Minutes 15    METs 3.43      NuStep   Level 5    Minutes 15    METs 5.1      REL-XR   Level 6    Minutes 15    METs 3.4      Biostep-RELP   Level 6    Minutes 15    METs 4      Home Exercise Plan   Plans to continue exercise at [facility]   Walking at home, [place] at [place] 2 additional days to program exercise sessions.    Initial Home Exercises Provided 09/07/20           Nutrition:  Target Goals: Understanding of nutrition guidelines, daily intake of sodium <1575m, cholesterol <2052m calories 30% from fat and 7% or less from saturated fats, daily to have 5 or more servings of fruits and vegetables.  Education: Controlling Sodium/Reading Food Labels -Group verbal and written material supporting the discussion of sodium use in heart healthy nutrition. Review and explanation with models, verbal and written materials for utilization of the food label.   [facility]  Date 06/26/17  Educator PI  Instruction Review Code (retired) 2- meets goals/outcomes      Education: General Nutrition Guidelines/Fats and Fiber: -Group instruction provided by verbal, written material, models and posters to present the general guidelines for heart healthy nutrition. Gives an explanation and review of dietary fats and fiber.   [facility]  Date 06/19/17  Educator PI  Instruction Review Code (retired) 2- meets goals/outcomes      Biometrics:  Pre Biometrics - 06/08/20 1631      Pre Biometrics    Height 5' 5.25" (1.657 m)    Weight 176 lb 3.2 oz (79.9 kg)    BMI (Calculated) 29.11    Single Leg Stand 3.87 seconds            Nutrition Therapy  Plan and Nutrition Goals:  Nutrition Therapy & Goals - 07/22/20 1614      Nutrition Therapy   Diet Heart healthy, Low Na, T2DM    Protein (specify units) 65g    Fiber 30 grams    Whole Grain Foods 3 servings    Saturated Fats 12 max. grams    Fruits and Vegetables 5 servings/day    Sodium 1.5 grams      Personal Nutrition Goals   Nutrition Goal ST: increase fiber, ex: adding oatmeal LT: wants to walk well again, get active again    Comments Well controlled T2DM. Pt reports Dr. [name] took him off medication becuase he was board[place]line. B: boiled eggs cup of hot chocolate L: sandwich - may not eat S: pack of nabs with regular soda D: example last night. steak, pinto beans, squash and onions, cucumbers and tomatoes. Pt reports loving fruits and vegetables and even has a garden. 2% milk, unsweet tea with splenda is normally what he will drink.  Discussed heart healthy eating.      Intervention Plan   Intervention Prescribe, educate and counsel regarding individualized specific dietary modifications aiming towards targeted core components such as weight, hypertension, lipid management, diabetes, heart failure and other comorbidities.    Expected Outcomes Short Term Goal: Understand basic principles of dietary content, such as calories, fat, sodium, cholesterol and nutrients.;Short Term Goal: A plan has been developed with personal nutrition goals set during dietitian appointment.;Long Term Goal: Adherence to prescribed nutrition plan.           Nutrition Assessments:  Nutrition Assessments - 06/08/20 1636      MEDFICTS Scores   Pre Score 60           MEDIFICTS Score Key:          ?70 Need to make dietary changes          40-70 Heart Healthy Diet         ? 40 Therapeutic Level Cholesterol Diet  Nutrition Goals Re-Evaluation:   Nutrition Goals Re-Evaluation    [place] Name 06/25/20 1542 08/26/20 1639 09/16/20 1612         Goals   Current Weight 177 lb (80.3 kg) -- --     Nutrition Goal Lose more weight. ST: increase fiber, ex: adding oatmeal LT: wants to walk well again, get active again ST: add fruit and nuts to breakfast LT: wants to walk well again, get active again     Comment He states that he sometimes eats to much. He does not have an appetite like he used to. He wants to meet with the dietician to help gain more of an appetite. Continue with current changes Pt reports not doing well with nutrition, only ate a pack of nabs today. Oats or grits in am normally. Pt reports still eating a lot of vegetables and not a lot of meat. Usually has breakfast and dinner. Add nuts and fruit to breakfast     Expected Outcome Short: meet with dietician. Long: maintain a heart healthy diet and increase appetite. ST: increase fiber, ex: adding oatmeal LT: wants to walk well again, get active again ST: add fruit and nuts to breakfast LT: wants to walk well again, get active again            Nutrition Goals Discharge (Final Nutrition Goals Re-Evaluation):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

## 2020-09-17 NOTE — Progress Notes (Signed)
Daily Session Note  Patient Details  Name: Brett Blake MRN: [id number] Date of Birth: Sep 18, 1941 Referring Provider:     Cardiac Rehab from 06/08/2020 in [facility] Cardiac and Pulmonary Rehab  Referring Provider [name]      Encounter Date: 09/17/2020  Check In:  Session Check In - 09/17/20 1558      Check-In   Supervising physician immediately available to respond to emergencies See telemetry face sheet for immediately available ER MD    Location [facility]-Cardiac & Pulmonary Rehab    Staff Present Brett Papa, RN BSN;Brett [address] [place], [place], Brett Blake, CCRP, CCET    Virtual Visit No    Medication changes reported     No    Fall or balance concerns reported    No    Warm-up and Cool-down Performed on first and last piece of equipment    Resistance Training Performed Yes    VAD Patient? No    PAD/SET Patient? No      Pain Assessment   Currently in Pain? No/denies              Social History   Tobacco Use  Smoking Status Former Smoker  . Packs/day: 1.00  . Years: 15.00  . Pack years: 15.00  . Types: Cigarettes  Smokeless Tobacco Never Used  Tobacco Comment   quit 1977    Goals Met:  Independence with exercise equipment Exercise tolerated well No report of cardiac concerns or symptoms Strength training completed today  Goals Unmet:  Not Applicable   Comments:  [name] graduated today from  rehab with 36 sessions completed.  Details of the patient's exercise prescription and what He needs to do in order to continue the prescription and progress were discussed with patient.  Patient was given a copy of prescription and goals.  Patient verbalized understanding.  [name] plans to continue to exercise by attending the [facility].     Dr. Emily Filbert is Medical Director for [place] [facility].

## 2020-09-17 NOTE — Progress Notes (Signed)
Discharge Progress Report  Patient Details  Name: Brett Blake MRN: [id number] Date of Birth: 16-Aug-1941 Referring Provider:     Cardiac Rehab from 06/08/2020 in [facility]  Referring Provider [name]       Number of Visits: 36  Reason for Discharge:  Patient reached a stable level of exercise. Patient independent in their exercise. Patient has met program and personal goals.  Smoking History:  Social History   Tobacco Use  Smoking Status Former Smoker  . Packs/day: 1.00  . Years: 15.00  . Pack years: 15.00  . Types: Cigarettes  Smokeless Tobacco Never Used  Tobacco Comment   quit 1977    Diagnosis:  Chronic stable angina ([place])  ADL UCSD:   Initial Exercise Prescription:  Initial Exercise Prescription - 06/08/20 1600      Date of Initial Exercise RX and Referring Provider   Date 06/08/20    Referring Provider [name]      Treadmill   MPH 2    Grade 0    Minutes 15    METs 2.5      Recumbant Bike   Level 1    RPM 60    Minutes 15    METs 2.5      NuStep   Level 1    SPM 80    Minutes 15    METs 2.5      Recumbant Elliptical   Level 1    RPM 50    Minutes 15    METs 2.5      REL-XR   Level 1    Speed 50    Minutes 15    METs 2.5      Prescription Details   Frequency (times per week) 3    Duration Progress to 30 minutes of continuous aerobic without signs/symptoms of physical distress      Intensity   THRR 40-80% of Max Heartrate 109-132    Ratings of Perceived Exertion 11-13    Perceived Dyspnea 0-4      Resistance Training   Training Prescription Yes    Weight 3 lb    Reps 10-15           Discharge Exercise Prescription (Final Exercise Prescription Changes):  Exercise Prescription Changes - 09/09/20 0900      Response to Exercise   Blood Pressure (Admit) 100/58    Blood Pressure (Exercise) 112/58    Blood Pressure (Exit) 100/60    Heart Rate (Admit) 108 bpm    Heart Rate (Exercise) 119 bpm     Heart Rate (Exit) 100 bpm    Rating of Perceived Exertion (Exercise) 16    Symptoms none    Duration Continue with 30 min of aerobic exercise without signs/symptoms of physical distress.    Intensity THRR unchanged      Progression   Progression Continue to progress workloads to maintain intensity without signs/symptoms of physical distress.    Average METs 3.98      Resistance Training   Training Prescription Yes    Weight 6 lb    Reps 10-15      Interval Training   Interval Training No      Treadmill   MPH 2.5    Grade 1.5    Minutes 15    METs 3.43      NuStep   Level 5    Minutes 15    METs 5.1      REL-XR   Level 6  Minutes 15    METs 3.4      Biostep-RELP   Level 6    Minutes 15    METs 4      Home Exercise Plan   Plans to continue exercise at [facility]   Walking at home, [employer] at [place] 2 additional days to program exercise sessions.    Initial Home Exercises Provided 09/07/20           Functional Capacity:  6 Minute Walk    Row Name 06/08/20 1624 09/09/20 1618       6 Minute Walk   Phase Initial Discharge    Distance 1300 feet 1690 feet    Distance % Change -- 30 %    Distance Feet Change -- 390 ft    Walk Time 6 minutes 6 minutes    # of Rest Breaks 0 0    MPH 2.46 3.2    METS 2.26 3.17    RPE 17 17    Perceived Dyspnea  2 1    VO2 Peak 7.9 11.1    Symptoms Yes (comment) No    Comments L side - has asthma - ha sspoken to Dr [name] - has treatment about 1x per year --    Resting HR 87 bpm 95 bpm    Resting BP 112/64 104/58    Resting Oxygen Saturation  96 % --    Exercise Oxygen Saturation  during 6 min walk 96 % --    Max Ex. HR 101 bpm 116 bpm    Max Ex. BP 112/66 124/54    2 Minute Post BP 112/68 --           Psychological, QOL, Others - Outcomes: PHQ 2/9: Depression screen [facility] 2/9 09/14/2020 06/08/2020 05/08/2017  Decreased Interest _0 Down, Depressed, Hopeless 1 1 0  PHQ - 2 Score _1 Altered sleeping 1 0 2  Tired, decreased energy _2 Change in appetite _3 Feeling bad or failure about yourself  1 1 0  Trouble concentrating _4 Moving slowly or fidgety/restless 2 0 0  Suicidal thoughts 1 0 0  PHQ-9 Score _5 Difficult doing work/chores - Somewhat difficult Somewhat difficult    Quality of Life:  Quality of Life - 09/14/20 1627      Quality of Life   Select Quality of Life      Quality of Life Scores   Health/Function Pre 20.93 %    Health/Function Post 21.13 %    Health/Function % Change 0.96 %    Socioeconomic Pre 29.64 %    Socioeconomic Post 26 %    Socioeconomic % Change  -12.28 %    Psych/Spiritual Pre 29.14 %    Psych/Spiritual Post 28.21 %    Psych/Spiritual % Change -3.19 %    Family Pre 30 %    Family Post 22.8 %    Family % Change -24 %    GLOBAL Pre 25.75 %    GLOBAL Post 23.9 %    GLOBAL % Change -7.18 %           Nutrition & Weight - Outcomes:  Pre Biometrics - 06/08/20 1631      Pre Biometrics   Height 5' 5.25" (1.657 m)    Weight 176 lb 3.2 oz (79.9 kg)    BMI (Calculated) 29.11    Single Leg Stand  3.87 seconds            Nutrition:  Nutrition Therapy & Goals - 07/22/20 1614      Nutrition Therapy   Diet Heart healthy, Low Na, T2DM    Protein (specify units) 65g    Fiber 30 grams    Whole Grain Foods 3 servings    Saturated Fats 12 max. grams    Fruits and Vegetables 5 servings/day    Sodium 1.5 grams      Personal Nutrition Goals   Nutrition Goal ST: increase fiber, ex: adding oatmeal LT: wants to walk well again, get active again    Comments Well controlled T2DM. Pt reports Dr. [name] took him off medication becuase he was boarderline. B: boiled eggs cup of hot chocolate L: sandwich - may not eat S: pack of nabs with regular soda D: example last night. steak, pinto beans, squash and onions, cucumbers and tomatoes. Pt reports loving fruits and vegetables and even has a garden. 2% milk, unsweet tea  with splenda is normally what he will drink.  Discussed heart healthy eating.      Intervention Plan   Intervention Prescribe, educate and counsel regarding individualized specific dietary modifications aiming towards targeted core components such as weight, hypertension, lipid management, diabetes, heart failure and other comorbidities.    Expected Outcomes Short Term Goal: Understand basic principles of dietary content, such as calories, fat, sodium, cholesterol and nutrients.;Short Term Goal: A plan has been developed with personal nutrition goals set during dietitian appointment.;Long Term Goal: Adherence to prescribed nutrition plan.           Nutrition Discharge:  Nutrition Assessments - 06/08/20 1636      MEDFICTS Scores   Pre Score 60           Education Questionnaire Score:  Knowledge Questionnaire Score - 09/14/20 1649      Knowledge Questionnaire Score   Post Score 25/26           Goals reviewed with patient; copy given to patient.

## 2020-09-23 IMAGING — CR DG CHEST 2V
2 series · 2 of 2 positions shown · non-contrast
Comparison: Radiograph 06/01/2020, CT 06/02/2020

CLINICAL DATA: Chest pressure, near syncope

EXAM:
CHEST - 2 VIEW

[chest lat]
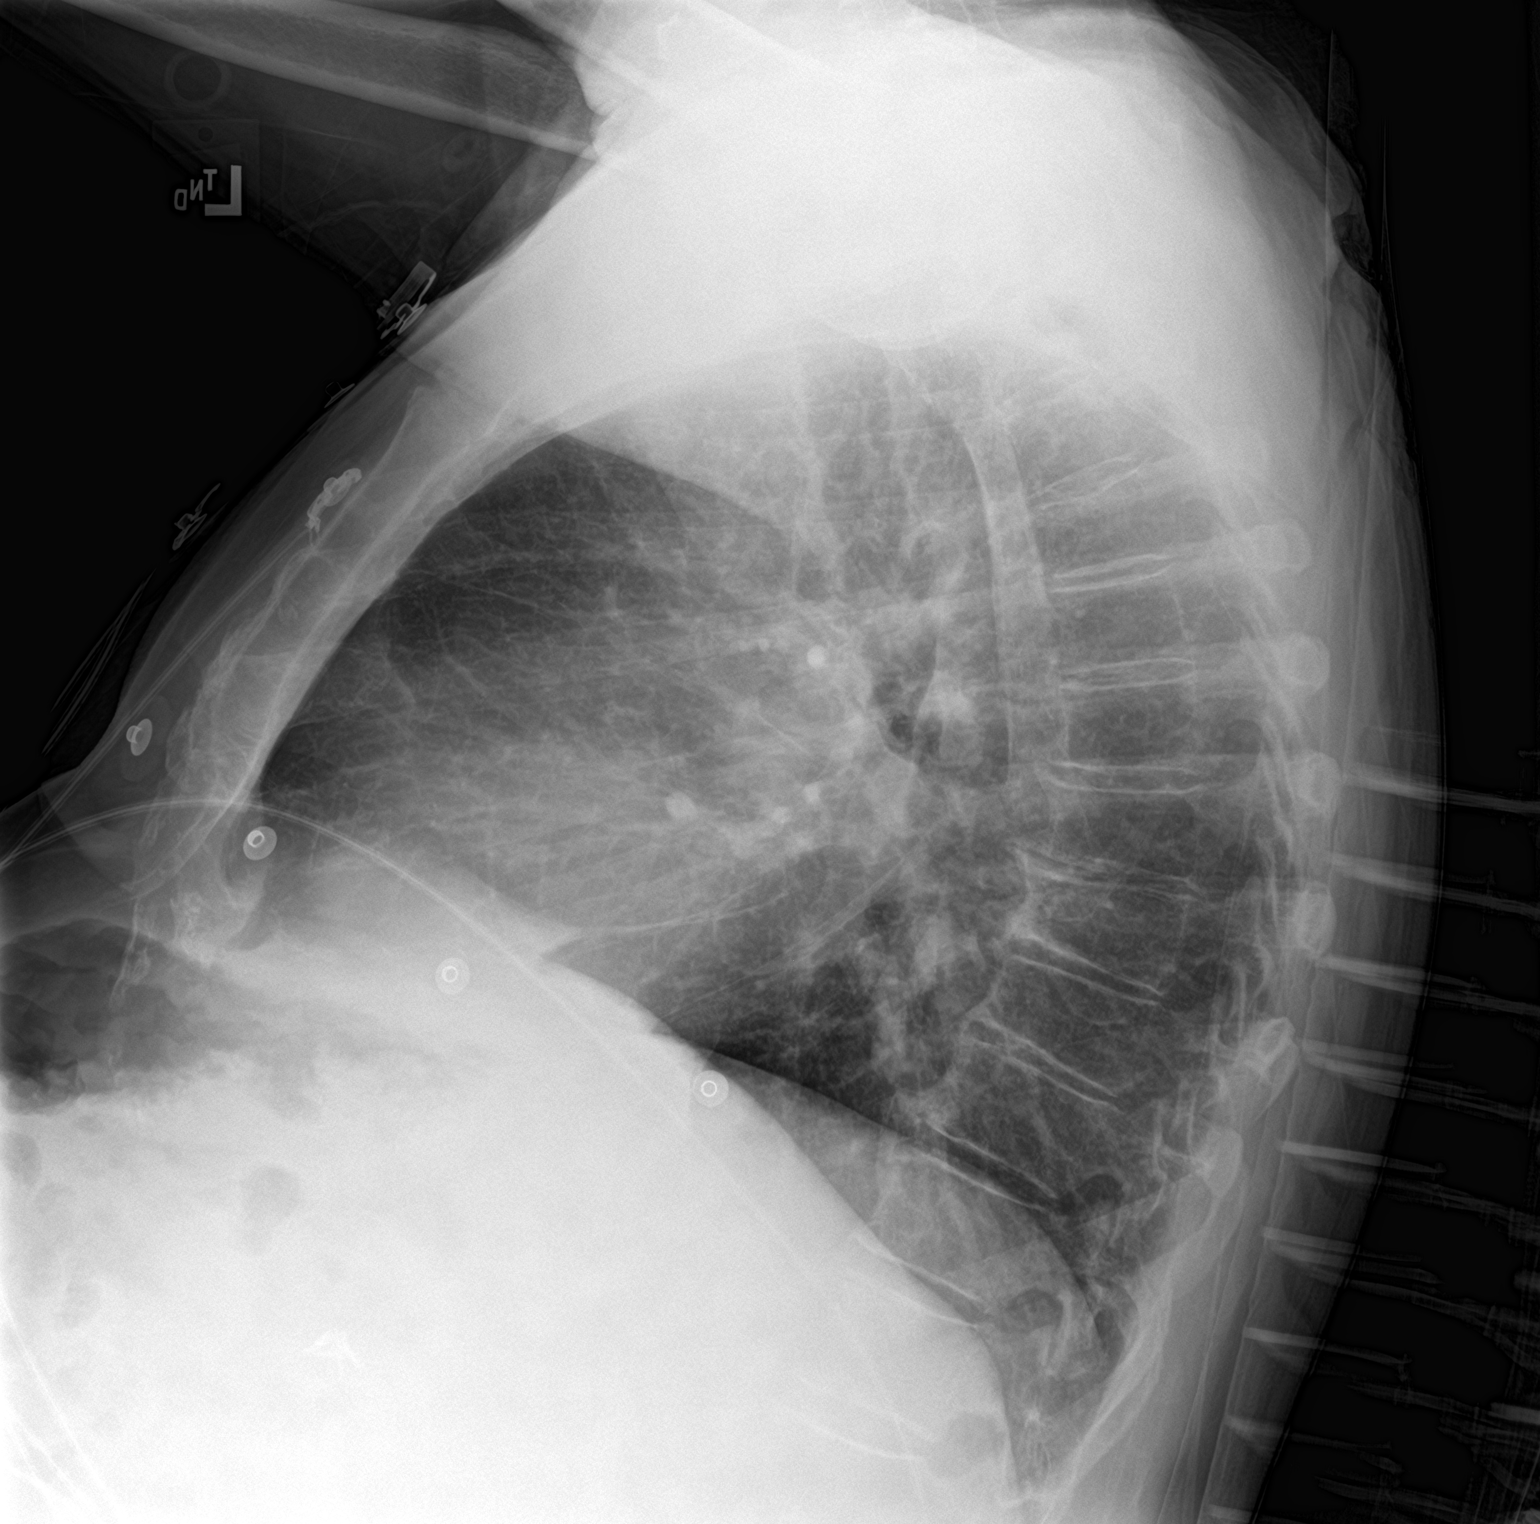

[chest ap]
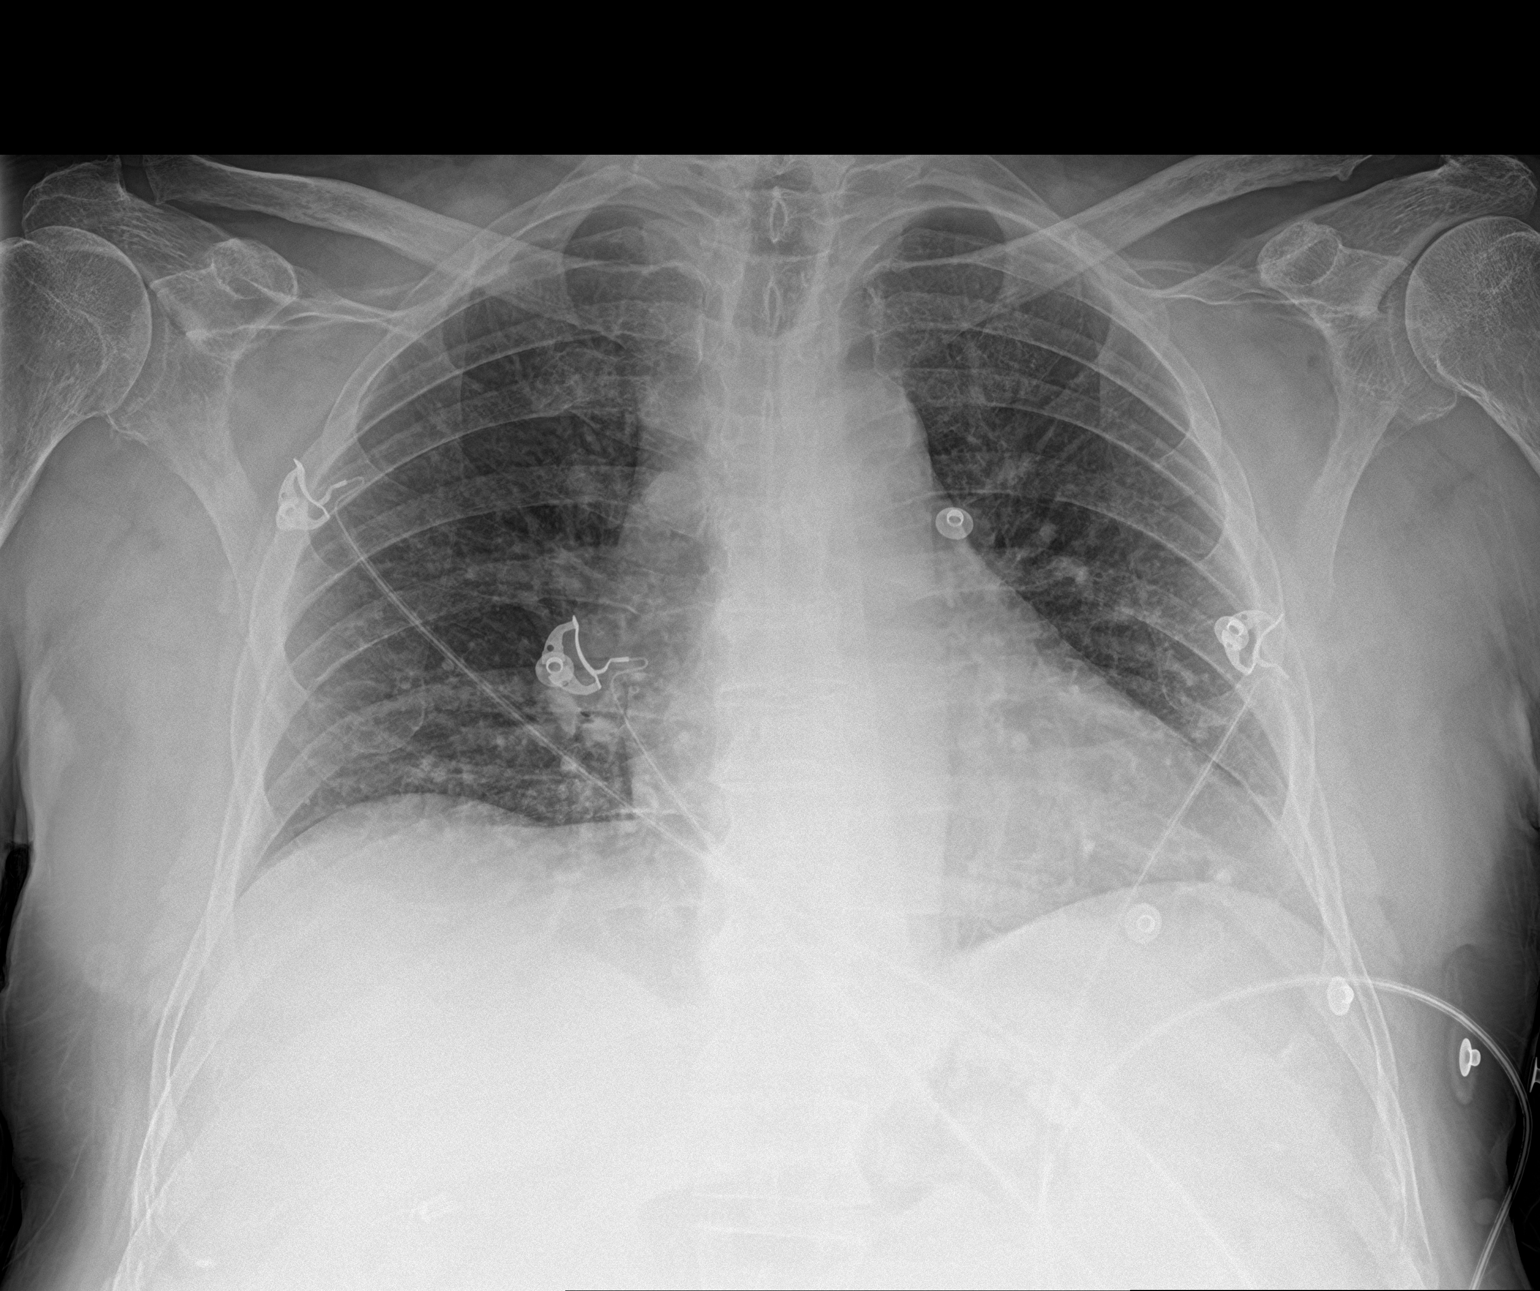

[2 of 2 positions shown; findings below may reference images not displayed]

FINDINGS: Low volumes with atelectatic change in mild bronchitic features
similar to the comparison study. Small calcified granuloma in the
left lung base unchanged from prior. No consolidation, features of
edema, pneumothorax, or effusion. The aorta is calcified. The
remaining cardiomediastinal contours are unremarkable. No acute
osseous or soft tissue abnormality. Degenerative changes are present
in the imaged spine and shoulders. Telemetry leads overlie the
chest.
IMPRESSION: Basilar atelectasis without other acute cardiopulmonary abnormality.

Mild stable bronchitic changes.

## 2020-11-16 DIAGNOSIS — R569 Unspecified convulsions: Secondary | ICD-10-CM

## 2020-11-16 DIAGNOSIS — F445 Conversion disorder with seizures or convulsions: Secondary | ICD-10-CM

## 2021-04-20 DIAGNOSIS — F1010 Alcohol abuse, uncomplicated: Secondary | ICD-10-CM | POA: Diagnosis present

## 2021-05-19 ENCOUNTER — Inpatient Hospital Stay
Admission: EM | Admit: 2021-05-19 | Discharge: 2021-05-21 | DRG: 872 | Disposition: A | Discharge status: Home / Self Care | Payer: Medicare Other | Attending: Internal Medicine | Admitting: Internal Medicine

## 2021-05-19 ENCOUNTER — Encounter: Payer: Self-pay | Admitting: Emergency Medicine

## 2021-05-19 ENCOUNTER — Other Ambulatory Visit: Payer: Self-pay

## 2021-05-19 ENCOUNTER — Emergency Department: Payer: Medicare Other

## 2021-05-19 DIAGNOSIS — Z882 Allergy status to sulfonamides status: Secondary | ICD-10-CM

## 2021-05-19 DIAGNOSIS — Z86010 Personal history of colonic polyps: Secondary | ICD-10-CM

## 2021-05-19 DIAGNOSIS — E7800 Pure hypercholesterolemia, unspecified: Secondary | ICD-10-CM | POA: Diagnosis present

## 2021-05-19 DIAGNOSIS — Z79899 Other long term (current) drug therapy: Secondary | ICD-10-CM

## 2021-05-19 DIAGNOSIS — Z7951 Long term (current) use of inhaled steroids: Secondary | ICD-10-CM

## 2021-05-19 DIAGNOSIS — G4730 Sleep apnea, unspecified: Secondary | ICD-10-CM

## 2021-05-19 DIAGNOSIS — J42 Unspecified chronic bronchitis: Secondary | ICD-10-CM | POA: Diagnosis present

## 2021-05-19 DIAGNOSIS — F419 Anxiety disorder, unspecified: Secondary | ICD-10-CM | POA: Diagnosis present

## 2021-05-19 DIAGNOSIS — K219 Gastro-esophageal reflux disease without esophagitis: Secondary | ICD-10-CM | POA: Diagnosis present

## 2021-05-19 DIAGNOSIS — I10 Essential (primary) hypertension: Secondary | ICD-10-CM | POA: Diagnosis present

## 2021-05-19 DIAGNOSIS — R6520 Severe sepsis without septic shock: Secondary | ICD-10-CM | POA: Diagnosis present

## 2021-05-19 DIAGNOSIS — A045 Campylobacter enteritis: Secondary | ICD-10-CM | POA: Diagnosis present

## 2021-05-19 DIAGNOSIS — Z87891 Personal history of nicotine dependence: Secondary | ICD-10-CM

## 2021-05-19 DIAGNOSIS — R197 Diarrhea, unspecified: Secondary | ICD-10-CM

## 2021-05-19 DIAGNOSIS — Z9049 Acquired absence of other specified parts of digestive tract: Secondary | ICD-10-CM

## 2021-05-19 DIAGNOSIS — R112 Nausea with vomiting, unspecified: Secondary | ICD-10-CM

## 2021-05-19 DIAGNOSIS — A044 Other intestinal Escherichia coli infections: Secondary | ICD-10-CM | POA: Diagnosis present

## 2021-05-19 DIAGNOSIS — Z888 Allergy status to other drugs, medicaments and biological substances status: Secondary | ICD-10-CM

## 2021-05-19 DIAGNOSIS — J189 Pneumonia, unspecified organism: Secondary | ICD-10-CM

## 2021-05-19 DIAGNOSIS — G4733 Obstructive sleep apnea (adult) (pediatric): Secondary | ICD-10-CM | POA: Diagnosis present

## 2021-05-19 DIAGNOSIS — Z8042 Family history of malignant neoplasm of prostate: Secondary | ICD-10-CM

## 2021-05-19 DIAGNOSIS — A419 Sepsis, unspecified organism: Secondary | ICD-10-CM | POA: Diagnosis not present

## 2021-05-19 DIAGNOSIS — Z806 Family history of leukemia: Secondary | ICD-10-CM

## 2021-05-19 DIAGNOSIS — E785 Hyperlipidemia, unspecified: Secondary | ICD-10-CM | POA: Diagnosis present

## 2021-05-19 DIAGNOSIS — Z8249 Family history of ischemic heart disease and other diseases of the circulatory system: Secondary | ICD-10-CM

## 2021-05-19 DIAGNOSIS — Z20822 Contact with and (suspected) exposure to covid-19: Secondary | ICD-10-CM | POA: Diagnosis present

## 2021-05-19 DIAGNOSIS — J449 Chronic obstructive pulmonary disease, unspecified: Secondary | ICD-10-CM | POA: Diagnosis present

## 2021-05-19 DIAGNOSIS — A039 Shigellosis, unspecified: Secondary | ICD-10-CM

## 2021-05-19 DIAGNOSIS — K529 Noninfective gastroenteritis and colitis, unspecified: Secondary | ICD-10-CM

## 2021-05-19 DIAGNOSIS — E119 Type 2 diabetes mellitus without complications: Secondary | ICD-10-CM | POA: Diagnosis present

## 2021-05-19 DIAGNOSIS — Z7982 Long term (current) use of aspirin: Secondary | ICD-10-CM

## 2021-05-19 NOTE — ED Triage Notes (Addendum)
Pt arrived via POV with reports of vomiting, diarrhea, chills. Pt also reported to security and registration staff that he was having a seizure. Pt was not post-ictal when I arrived to him in the lobby and per staff that witnessed the patient having a seizure the patient was alert and talking the entire time. Pt has hx of pseudo-seizures.  Pt is alert and oriented at this time. Speech clear.  Pt is tremulous in triage.   Pt also c/o chest pain in triage that he states is "always there" in his lung and requires antibiotic treatment.

## 2021-05-20 ENCOUNTER — Emergency Department: Payer: Medicare Other

## 2021-05-20 DIAGNOSIS — K219 Gastro-esophageal reflux disease without esophagitis: Secondary | ICD-10-CM | POA: Diagnosis present

## 2021-05-20 DIAGNOSIS — E119 Type 2 diabetes mellitus without complications: Secondary | ICD-10-CM | POA: Diagnosis present

## 2021-05-20 DIAGNOSIS — E785 Hyperlipidemia, unspecified: Secondary | ICD-10-CM | POA: Diagnosis present

## 2021-05-20 DIAGNOSIS — Z7982 Long term (current) use of aspirin: Secondary | ICD-10-CM | POA: Diagnosis not present

## 2021-05-20 DIAGNOSIS — Z806 Family history of leukemia: Secondary | ICD-10-CM | POA: Diagnosis not present

## 2021-05-20 DIAGNOSIS — G4733 Obstructive sleep apnea (adult) (pediatric): Secondary | ICD-10-CM | POA: Diagnosis present

## 2021-05-20 DIAGNOSIS — Z882 Allergy status to sulfonamides status: Secondary | ICD-10-CM | POA: Diagnosis not present

## 2021-05-20 DIAGNOSIS — Z86010 Personal history of colonic polyps: Secondary | ICD-10-CM | POA: Diagnosis not present

## 2021-05-20 DIAGNOSIS — Z8042 Family history of malignant neoplasm of prostate: Secondary | ICD-10-CM | POA: Diagnosis not present

## 2021-05-20 DIAGNOSIS — A419 Sepsis, unspecified organism: Secondary | ICD-10-CM | POA: Diagnosis present

## 2021-05-20 DIAGNOSIS — F419 Anxiety disorder, unspecified: Secondary | ICD-10-CM | POA: Diagnosis present

## 2021-05-20 DIAGNOSIS — Z7951 Long term (current) use of inhaled steroids: Secondary | ICD-10-CM | POA: Diagnosis not present

## 2021-05-20 DIAGNOSIS — Z8249 Family history of ischemic heart disease and other diseases of the circulatory system: Secondary | ICD-10-CM | POA: Diagnosis not present

## 2021-05-20 DIAGNOSIS — K529 Noninfective gastroenteritis and colitis, unspecified: Secondary | ICD-10-CM | POA: Diagnosis not present

## 2021-05-20 DIAGNOSIS — R6520 Severe sepsis without septic shock: Secondary | ICD-10-CM | POA: Diagnosis present

## 2021-05-20 DIAGNOSIS — Z888 Allergy status to other drugs, medicaments and biological substances status: Secondary | ICD-10-CM | POA: Diagnosis not present

## 2021-05-20 DIAGNOSIS — J189 Pneumonia, unspecified organism: Secondary | ICD-10-CM | POA: Diagnosis present

## 2021-05-20 DIAGNOSIS — Z87891 Personal history of nicotine dependence: Secondary | ICD-10-CM | POA: Diagnosis not present

## 2021-05-20 DIAGNOSIS — E7800 Pure hypercholesterolemia, unspecified: Secondary | ICD-10-CM | POA: Diagnosis present

## 2021-05-20 DIAGNOSIS — J449 Chronic obstructive pulmonary disease, unspecified: Secondary | ICD-10-CM | POA: Diagnosis present

## 2021-05-20 DIAGNOSIS — Z9049 Acquired absence of other specified parts of digestive tract: Secondary | ICD-10-CM | POA: Diagnosis not present

## 2021-05-20 DIAGNOSIS — A045 Campylobacter enteritis: Secondary | ICD-10-CM | POA: Diagnosis present

## 2021-05-20 DIAGNOSIS — Z79899 Other long term (current) drug therapy: Secondary | ICD-10-CM | POA: Diagnosis not present

## 2021-05-20 DIAGNOSIS — Z20822 Contact with and (suspected) exposure to covid-19: Secondary | ICD-10-CM | POA: Diagnosis present

## 2021-05-20 DIAGNOSIS — I10 Essential (primary) hypertension: Secondary | ICD-10-CM | POA: Diagnosis present

## 2021-05-20 DIAGNOSIS — A044 Other intestinal Escherichia coli infections: Secondary | ICD-10-CM | POA: Diagnosis present

## 2021-05-20 LAB — COMPREHENSIVE METABOLIC PANEL
ALT: 28 U/L (ref 0–44)
AST: 35 U/L (ref 15–41)
Albumin: 4 g/dL (ref 3.5–5.0)
Alkaline Phosphatase: 71 U/L (ref 38–126)
Anion gap: 15 (ref 5–15)
BUN: 26 mg/dL — ABNORMAL HIGH (ref 8–23)
CO2: 22 mmol/L (ref 22–32)
Calcium: 9.2 mg/dL (ref 8.9–10.3)
Chloride: 100 mmol/L (ref 98–111)
Creatinine, Ser: 1.12 mg/dL (ref 0.61–1.24)
GFR, Estimated: >60 mL/min (ref >60)
Glucose, Bld: 153 mg/dL — ABNORMAL HIGH (ref 70–99)
Potassium: 3.3 mmol/L — ABNORMAL LOW (ref 3.5–5.1)
Sodium: 137 mmol/L (ref 135–145)
Total Bilirubin: 0.8 mg/dL (ref 0.3–1.2)
Total Protein: 7.6 g/dL (ref 6.5–8.1)

## 2021-05-20 LAB — CBC WITH DIFFERENTIAL/PLATELET
Abs Immature Granulocytes: 0.03 10*3/uL (ref 0.00–0.07)
Basophils Absolute: 0 10*3/uL (ref 0.0–0.1)
Basophils Relative: 0 %
Eosinophils Absolute: 0 10*3/uL (ref 0.0–0.5)
Eosinophils Relative: 0 %
HCT: 39.6 % (ref 39.0–52.0)
Hemoglobin: 13.4 g/dL (ref 13.0–17.0)
Immature Granulocytes: 0 %
Lymphocytes Relative: 7 %
Lymphs Abs: 0.5 10*3/uL — ABNORMAL LOW (ref 0.7–4.0)
MCH: 32.1 pg (ref 26.0–34.0)
MCHC: 33.8 g/dL (ref 30.0–36.0)
MCV: 95 fL (ref 80.0–100.0)
Monocytes Absolute: 0.9 10*3/uL (ref 0.1–1.0)
Monocytes Relative: 11 %
Neutro Abs: 6.5 10*3/uL (ref 1.7–7.7)
Neutrophils Relative %: 82 %
Platelets: 175 10*3/uL (ref 150–400)
RBC: 4.17 MIL/uL — ABNORMAL LOW (ref 4.22–5.81)
RDW: 13.6 % (ref 11.5–15.5)
WBC: 8 10*3/uL (ref 4.0–10.5)
nRBC: 0 % (ref 0.0–0.2)

## 2021-05-20 LAB — LACTIC ACID, PLASMA
Lactic Acid, Venous: 4 mmol/L (ref 0.5–1.9)
Lactic Acid, Venous: 3.8 mmol/L (ref 0.5–1.9)
Lactic Acid, Venous: 1.6 mmol/L (ref 0.5–1.9)

## 2021-05-20 LAB — GASTROINTESTINAL PANEL BY PCR, STOOL (REPLACES STOOL CULTURE)
Adenovirus F40/41: NOT DETECTED
Astrovirus: NOT DETECTED
Campylobacter species: DETECTED — AB
Cryptosporidium: NOT DETECTED
Cyclospora cayetanensis: NOT DETECTED
E. coli O157: NOT DETECTED
Entamoeba histolytica: NOT DETECTED
Enteroaggregative E coli (EAEC): NOT DETECTED
Enterotoxigenic E coli (ETEC): NOT DETECTED
Giardia lamblia: NOT DETECTED
Norovirus GI/GII: NOT DETECTED
Plesimonas shigelloides: NOT DETECTED
Rotavirus A: NOT DETECTED
Salmonella species: NOT DETECTED
Sapovirus (I, II, IV, and V): NOT DETECTED
Shiga like toxin producing E coli (STEC): DETECTED — AB
Shigella/Enteroinvasive E coli (EIEC): NOT DETECTED
Vibrio cholerae: NOT DETECTED
Vibrio species: NOT DETECTED
Yersinia enterocolitica: NOT DETECTED

## 2021-05-20 LAB — URINALYSIS, COMPLETE (UACMP) WITH MICROSCOPIC
Bacteria, UA: NONE SEEN
Bilirubin Urine: NEGATIVE
Glucose, UA: NEGATIVE mg/dL
Ketones, ur: NEGATIVE mg/dL
Leukocytes,Ua: NEGATIVE
Nitrite: NEGATIVE
Protein, ur: NEGATIVE mg/dL
RBC / HPF: >50 RBC/hpf — ABNORMAL HIGH (ref 0–5)
Specific Gravity, Urine: 1.016 (ref 1.005–1.030)
Squamous Epithelial / HPF: NONE SEEN (ref 0–5)
pH: 5 (ref 5.0–8.0)

## 2021-05-20 LAB — RESP PANEL BY RT-PCR (FLU A&B, COVID) ARPGX2
Influenza A by PCR: NEGATIVE
Influenza B by PCR: NEGATIVE
SARS Coronavirus 2 by RT PCR: NEGATIVE

## 2021-05-20 LAB — LIPASE, BLOOD: Lipase: 44 U/L (ref 11–51)

## 2021-05-20 LAB — C DIFFICILE QUICK SCREEN W PCR REFLEX
C Diff antigen: NEGATIVE
C Diff interpretation: NOT DETECTED
C Diff toxin: NEGATIVE

## 2021-05-20 LAB — PROCALCITONIN: Procalcitonin: 0.17 ng/mL

## 2021-05-20 LAB — TROPONIN I (HIGH SENSITIVITY)
Troponin I (High Sensitivity): 10 ng/L (ref <18)
Troponin I (High Sensitivity): 13 ng/L (ref <18)

## 2021-05-20 MED ORDER — ONDANSETRON HCL 4 MG PO TABS: 4.0000 mg | ORAL_TABLET | Freq: Four times a day (QID) | ORAL | Status: DC | PRN

## 2021-05-20 MED ORDER — LACTATED RINGERS IV SOLN: INTRAVENOUS | Status: DC

## 2021-05-20 MED ORDER — AMITRIPTYLINE HCL 10 MG PO TABS
10.0000 mg | ORAL_TABLET | Freq: Every day | ORAL | Status: DC
  Administered 2021-05-20: 21:00:00 10 mg via ORAL
  Filled 2021-05-20 (×2): qty 1

## 2021-05-20 MED ORDER — ONDANSETRON HCL 4 MG/2ML IJ SOLN
4.0000 mg | Freq: Four times a day (QID) | INTRAMUSCULAR | Status: DC | PRN
  Administered 2021-05-20: 4 mg via INTRAVENOUS
  Filled 2021-05-20: qty 2

## 2021-05-20 MED ORDER — ACETAMINOPHEN 325 MG PO TABS
650.0000 mg | ORAL_TABLET | ORAL | Status: DC | PRN
  Administered 2021-05-20 – 2021-05-21 (×3): 650 mg via ORAL
  Filled 2021-05-20 (×3): qty 2

## 2021-05-20 MED ORDER — LACTATED RINGERS IV BOLUS (SEPSIS)
500.0000 mL | Freq: Once | INTRAVENOUS | Status: AC
  Administered 2021-05-20: 500 mL via INTRAVENOUS

## 2021-05-20 MED ORDER — ACETAMINOPHEN 325 MG PO TABS
650.0000 mg | ORAL_TABLET | Freq: Four times a day (QID) | ORAL | Status: DC | PRN
  Filled 2021-05-20: qty 2

## 2021-05-20 MED ORDER — MONTELUKAST SODIUM 10 MG PO TABS
10.0000 mg | ORAL_TABLET | Freq: Every day | ORAL | Status: DC
  Administered 2021-05-20: 10 mg via ORAL
  Filled 2021-05-20: qty 1

## 2021-05-20 MED ORDER — FLUTICASONE PROPIONATE 50 MCG/ACT NA SUSP
1.0000 | Freq: Every day | NASAL | Status: DC | PRN
  Filled 2021-05-20: qty 16

## 2021-05-20 MED ORDER — ALPRAZOLAM 0.25 MG PO TABS: 0.2500 mg | ORAL_TABLET | Freq: Every day | ORAL | Status: DC | PRN

## 2021-05-20 MED ORDER — ASPIRIN 81 MG PO CHEW
81.0000 mg | CHEWABLE_TABLET | Freq: Every day | ORAL | Status: DC
  Administered 2021-05-21: 09:00:00 81 mg via ORAL
  Filled 2021-05-20: qty 1

## 2021-05-20 MED ORDER — METRONIDAZOLE 500 MG/100ML IV SOLN
500.0000 mg | Freq: Once | INTRAVENOUS | Status: AC
  Administered 2021-05-20: 500 mg via INTRAVENOUS
  Filled 2021-05-20: qty 100

## 2021-05-20 MED ORDER — LACTATED RINGERS IV BOLUS (SEPSIS)
1000.0000 mL | Freq: Once | INTRAVENOUS | Status: AC
  Administered 2021-05-20: 1000 mL via INTRAVENOUS

## 2021-05-20 MED ORDER — MELATONIN 5 MG PO TABS
5.0000 mg | ORAL_TABLET | Freq: Once | ORAL | Status: AC
  Administered 2021-05-20: 5 mg via ORAL
  Filled 2021-05-20: qty 1

## 2021-05-20 MED ORDER — FAMOTIDINE 20 MG PO TABS
40.0000 mg | ORAL_TABLET | Freq: Every day | ORAL | Status: DC
  Administered 2021-05-20: 21:00:00 40 mg via ORAL
  Filled 2021-05-20: qty 2

## 2021-05-20 MED ORDER — SODIUM CHLORIDE 0.9 % IV SOLN
2.0000 g | Freq: Once | INTRAVENOUS | Status: AC
  Administered 2021-05-20: 2 g via INTRAVENOUS
  Filled 2021-05-20: qty 2

## 2021-05-20 MED ORDER — VANCOMYCIN HCL 1750 MG/350ML IV SOLN
1750.0000 mg | Freq: Once | INTRAVENOUS | Status: AC
  Administered 2021-05-20: 1750 mg via INTRAVENOUS
  Filled 2021-05-20: qty 350

## 2021-05-20 MED ORDER — BISOPROLOL FUMARATE 5 MG PO TABS
2.5000 mg | ORAL_TABLET | Freq: Two times a day (BID) | ORAL | Status: DC
  Administered 2021-05-20 – 2021-05-21 (×2): 2.5 mg via ORAL
  Filled 2021-05-20 (×3): qty 0.5

## 2021-05-20 MED ORDER — PRIMIDONE 50 MG PO TABS
50.0000 mg | ORAL_TABLET | Freq: Every day | ORAL | Status: DC
  Administered 2021-05-20 – 2021-05-21 (×2): 50 mg via ORAL
  Filled 2021-05-20 (×2): qty 1

## 2021-05-20 MED ORDER — POTASSIUM CHLORIDE CRYS ER 20 MEQ PO TBCR
40.0000 meq | EXTENDED_RELEASE_TABLET | Freq: Once | ORAL | Status: AC
  Administered 2021-05-20: 40 meq via ORAL
  Filled 2021-05-20: qty 2

## 2021-05-20 MED ORDER — HYDROCODONE-ACETAMINOPHEN 5-325 MG PO TABS
1.0000 | ORAL_TABLET | ORAL | Status: DC | PRN
Start: 2021-05-20 — End: 2021-05-21
  Administered 2021-05-20: 1 via ORAL
  Filled 2021-05-20: qty 1

## 2021-05-20 MED ORDER — LACTATED RINGERS IV BOLUS
1000.0000 mL | Freq: Once | INTRAVENOUS | Status: AC
  Administered 2021-05-20: 1000 mL via INTRAVENOUS

## 2021-05-20 MED ORDER — ENOXAPARIN SODIUM 40 MG/0.4ML IJ SOSY
40.0000 mg | PREFILLED_SYRINGE | INTRAMUSCULAR | Status: DC
  Administered 2021-05-20 – 2021-05-21 (×2): 40 mg via SUBCUTANEOUS
  Filled 2021-05-20 (×2): qty 0.4

## 2021-05-20 MED ORDER — ACETAMINOPHEN 650 MG RE SUPP: 650.0000 mg | Freq: Four times a day (QID) | RECTAL | Status: DC | PRN

## 2021-05-20 MED ORDER — MELATONIN 5 MG PO TABS: 5.0000 mg | ORAL_TABLET | Freq: Every day | ORAL | Status: DC

## 2021-05-20 MED ORDER — TAB-A-VITE/IRON PO TABS
1.0000 | ORAL_TABLET | Freq: Every day | ORAL | Status: DC
  Administered 2021-05-21: 1 via ORAL
  Filled 2021-05-20 (×2): qty 1

## 2021-05-20 MED ORDER — VANCOMYCIN HCL IN DEXTROSE 1-5 GM/200ML-% IV SOLN: 1000.0000 mg | Freq: Once | INTRAVENOUS | Status: DC

## 2021-05-20 NOTE — Progress Notes (Signed)
Progress Note    OLUWASEYI RAFFEL   BJS:[id number]  DOB: 22-Sep-1941  DOA: 05/19/2021     0  PCP: [name], MD  CC: fever, vomiting, diarrhea  Hospital Course: AMANTE FOMBY is a 80 y.o. male with PMH HTN, COPD, OSA, anxiety, who presented to the ED with sudden onset chills followed by fever of 101, cough, nausea with dry heaving and diarrhea, all developing over period of 5 to 6 hours hours PTA.  Vomiting was mostly clear mucus, nonbloody nonbilious, and stool was loose and runny.  He denied affected contacts and ate nothing out of the ordinary aside from some fish from a restaurant.  He denied chest pain or shortness of breath.  Denied abdominal pain. Denied any bloody stools.  Temperature at home was 101. ED course: On arrival, afebrile at 98.6, BP 155/104, pulse 140 with O2 sat 97% on room air.  WBC normal but with elevated lactic acid 4.0-3.8.  CMP unremarkable, lipase normal, troponin negative at 13, urinalysis unremarkable.  COVID and flu negative.  C. difficile negative. Imaging: CT chest abdomen pelvis without contrast, no acute intrathoracic, intra-abdominal, intrapelvic abnormality.  Due to concern for sepsis, he was initiated on vancomycin, cefepime, Flagyl and started on fluids.  GI pathogen panel later returned positive for Campylobacter and STEC (Shiga like toxin producing E. Coli).   Interval History:  Patient seen in the ER this morning with his daughter bedside.  His symptoms seem to have improved some since admission.  He remains on fluids and we discussed remaining in the hospital through today to continue fluids and monitor symptoms.  Infectious disease was also consulted given findings on his GI pathogen panel.  ROS: Constitutional: negative for chills and sweats, Respiratory: negative for cough, Cardiovascular: negative for chest pain and Gastrointestinal: negative for abdominal pain  Assessment & Plan:  Severe sepsis Gastroenteritis  - subjective fever prior  to admission of 101, tachycardia, lactic acidosis; GI source - GIP shows Campylobacter and STEC (Shiga like toxin producing E. Coli). This is O157 negative and no further way to test for delineation between toxin 1/2.  ID consulted due to this.  Antibiotics are not recommended and patient recommended to continue supportive treatment.  His symptoms are also slowly improving -Continue fluids overnight - Repeat CBC in a.m. and monitor for any development of bloody bowel movements or abdominal pain  Chronic bronchitis (HCC) - Not acutely exacerbated - Continue home inhalers with duo nebs as needed    Hypertension -Hold diltiazem until further clarification of dose - Continue bisoprolol - Hold further meds  Anxiety - Continue home meds  Old records reviewed in assessment of this patient  Antimicrobials: Vancomycin 05/19/2021 x1  Cefepime 05/19/2021 x1  Flagyl 05/19/2021 x1   DVT prophylaxis: enoxaparin (LOVENOX) injection 40 mg Start: 05/20/21 0800   Code Status:   Code Status: Full Code Family Communication: daughter  Disposition Plan: Status is: Inpatient  Remains inpatient appropriate because:IV treatments appropriate due to intensity of illness or inability to take PO and Inpatient level of care appropriate due to severity of illness   Dispo: The patient is from: Home              Anticipated d/c is to: Home              Patient currently is not medically stable to d/c.   Difficult to place patient No      Risk of unplanned readmission score: Unplanned Admission- Pilot do not  use: 8.26   Objective: Blood pressure (!) 154/87, pulse 64, temperature 98.1 F (36.7 C), resp. rate 16, height 5\' 5"  (1.651 m), weight 77.1 kg, SpO2 100 %.  Examination: General appearance: alert, cooperative and no distress Head: Normocephalic, without obvious abnormality, atraumatic Eyes: EOMI Lungs: clear to auscultation bilaterally Heart: regular rate and rhythm and S1, S2  normal Abdomen: Obese, soft, no true tenderness to palpation.  Bowel sounds present Extremities: No edema Skin: mobility and turgor normal Neurologic: Grossly normal  Consultants:   ID  Procedures:     Data Reviewed: I have personally reviewed following labs and imaging studies Results for orders placed or performed during the hospital encounter of 05/19/21 (from the past 24 hour(s))  Lipase, blood     Status: None   Collection Time: 05/19/21 11:54 PM  Result Value Ref Range   Lipase 44 11 - 51 U/L  Comprehensive metabolic panel     Status: Abnormal   Collection Time: 05/19/21 11:54 PM  Result Value Ref Range   Sodium 137 135 - 145 mmol/L   Potassium 3.3 (L) 3.5 - 5.1 mmol/L   Chloride 100 98 - 111 mmol/L   CO2 22 22 - 32 mmol/L   Glucose, Bld 153 (H) 70 - 99 mg/dL   BUN 26 (H) 8 - 23 mg/dL   Creatinine, Ser 1.12 0.61 - 1.24 mg/dL   Calcium 9.2 8.9 - 10.3 mg/dL   Total Protein 7.6 6.5 - 8.1 g/dL   Albumin 4.0 3.5 - 5.0 g/dL   AST 35 15 - 41 U/L   ALT 28 0 - 44 U/L   Alkaline Phosphatase 71 38 - 126 U/L   Total Bilirubin 0.8 0.3 - 1.2 mg/dL   GFR, Estimated >60 >60 mL/min   Anion gap 15 5 - 15  Troponin I (High Sensitivity)     Status: None   Collection Time: 05/19/21 11:54 PM  Result Value Ref Range   Troponin I (High Sensitivity) 10 <18 ng/L  CBC with Differential     Status: Abnormal   Collection Time: 05/19/21 11:54 PM  Result Value Ref Range   WBC 8.0 4.0 - 10.5 K/uL   RBC 4.17 (L) 4.22 - 5.81 MIL/uL   Hemoglobin 13.4 13.0 - 17.0 g/dL   HCT 39.6 39.0 - 52.0 %   MCV 95.0 80.0 - 100.0 fL   MCH 32.1 26.0 - 34.0 pg   MCHC 33.8 30.0 - 36.0 g/dL   RDW 13.6 11.5 - 15.5 %   Platelets 175 150 - 400 K/uL   nRBC 0.0 0.0 - 0.2 %   Neutrophils Relative % 82 %   Neutro Abs 6.5 1.7 - 7.7 K/uL   Lymphocytes Relative 7 %   Lymphs Abs 0.5 (L) 0.7 - 4.0 K/uL   Monocytes Relative 11 %   Monocytes Absolute 0.9 0.1 - 1.0 K/uL   Eosinophils Relative 0 %   Eosinophils  Absolute 0.0 0.0 - 0.5 K/uL   Basophils Relative 0 %   Basophils Absolute 0.0 0.0 - 0.1 K/uL   Immature Granulocytes 0 %   Abs Immature Granulocytes 0.03 0.00 - 0.07 K/uL  Procalcitonin - Baseline     Status: None   Collection Time: 05/19/21 11:54 PM  Result Value Ref Range   Procalcitonin 0.17 ng/mL  Urinalysis, Complete w Microscopic     Status: Abnormal   Collection Time: 05/20/21 12:29 AM  Result Value Ref Range   Color, Urine YELLOW (A) YELLOW   APPearance CLEAR (  A) CLEAR   Specific Gravity, Urine 1.016 1.005 - 1.030   pH 5.0 5.0 - 8.0   Glucose, UA NEGATIVE NEGATIVE mg/dL   Hgb urine dipstick LARGE (A) NEGATIVE   Bilirubin Urine NEGATIVE NEGATIVE   Ketones, ur NEGATIVE NEGATIVE mg/dL   Protein, ur NEGATIVE NEGATIVE mg/dL   Nitrite NEGATIVE NEGATIVE   Leukocytes,Ua NEGATIVE NEGATIVE   RBC / HPF >50 (H) 0 - 5 RBC/hpf   WBC, UA 0-5 0 - 5 WBC/hpf   Bacteria, UA NONE SEEN NONE SEEN   Squamous Epithelial / LPF NONE SEEN 0 - 5   Mucus PRESENT   Lactic acid, plasma     Status: Abnormal   Collection Time: 05/20/21 12:29 AM  Result Value Ref Range   Lactic Acid, Venous 4.0 (HH) 0.5 - 1.9 mmol/L  Culture, blood (routine x 2)     Status: None (Preliminary result)   Collection Time: 05/20/21 12:29 AM   Specimen: BLOOD  Result Value Ref Range   Specimen Description BLOOD RIGHT ANTECUBITAL    Special Requests      BOTTLES DRAWN AEROBIC AND ANAEROBIC Blood Culture adequate volume   Culture      NO GROWTH < 12 HOURS Performed at [facility], [address] [place], [place] [address]    Report Status PENDING   Culture, blood (routine x 2)     Status: None (Preliminary result)   Collection Time: 05/20/21 12:29 AM   Specimen: BLOOD  Result Value Ref Range   Specimen Description BLOOD LEFT ANTECUBITAL    Special Requests      BOTTLES DRAWN AEROBIC AND ANAEROBIC Blood Culture adequate volume   Culture      NO GROWTH < 12 HOURS Performed at [facility], [address] [place], [place] [address]    Report Status PENDING   Resp Panel by RT-PCR (Flu A&B, Covid) Nasopharyngeal Swab     Status: None   Collection Time: 05/20/21 12:29 AM   Specimen: Nasopharyngeal Swab; Nasopharyngeal(NP) swabs in vial transport medium  Result Value Ref Range   SARS Coronavirus 2 by RT PCR NEGATIVE NEGATIVE   Influenza A by PCR NEGATIVE NEGATIVE   Influenza B by PCR NEGATIVE NEGATIVE  C Difficile Quick Screen w PCR reflex     Status: None   Collection Time: 05/20/21 12:29 AM   Specimen: Stool  Result Value Ref Range   C Diff antigen NEGATIVE NEGATIVE   C Diff toxin NEGATIVE NEGATIVE   C Diff interpretation No C. difficile detected.   Gastrointestinal Panel by PCR , Stool     Status: Abnormal   Collection Time: 05/20/21 12:29 AM   Specimen: Stool  Result Value Ref Range   Campylobacter species DETECTED (A) NOT DETECTED   Plesimonas shigelloides NOT DETECTED NOT DETECTED   Salmonella species NOT DETECTED NOT DETECTED   Yersinia enterocolitica NOT DETECTED NOT DETECTED   Vibrio species NOT DETECTED NOT DETECTED   Vibrio cholerae NOT DETECTED NOT DETECTED   Enteroaggregative E coli (EAEC) NOT DETECTED NOT DETECTED   Enterotoxigenic E coli (ETEC) NOT DETECTED NOT DETECTED   Shiga like toxin producing E coli (STEC) DETECTED (A) NOT DETECTED   E. coli O157 NOT DETECTED NOT DETECTED   Shigella/Enteroinvasive E coli (EIEC) NOT DETECTED NOT DETECTED   Cryptosporidium NOT DETECTED NOT DETECTED   Cyclospora cayetanensis NOT DETECTED NOT DETECTED   Entamoeba histolytica NOT DETECTED NOT DETECTED   Giardia lamblia NOT DETECTED NOT DETECTED   Adenovirus F40/41  NOT DETECTED NOT DETECTED   Astrovirus NOT DETECTED NOT DETECTED   Norovirus GI/GII NOT DETECTED NOT DETECTED   Rotavirus A NOT DETECTED NOT DETECTED   Sapovirus (I, II, IV, and V) NOT DETECTED NOT DETECTED  Troponin I (High Sensitivity)     Status: None   Collection Time: 05/20/21  2:10 AM  Result Value Ref  Range   Troponin I (High Sensitivity) 13 <18 ng/L  Lactic acid, plasma     Status: Abnormal   Collection Time: 05/20/21  2:10 AM  Result Value Ref Range   Lactic Acid, Venous 3.8 (HH) 0.5 - 1.9 mmol/L  Lactic acid, plasma     Status: None   Collection Time: 05/20/21 12:11 PM  Result Value Ref Range   Lactic Acid, Venous 1.6 0.5 - 1.9 mmol/L    Recent Results (from the past 240 hour(s))  Culture, blood (routine x 2)     Status: None (Preliminary result)   Collection Time: 05/20/21 12:29 AM   Specimen: BLOOD  Result Value Ref Range Status   Specimen Description BLOOD RIGHT ANTECUBITAL  Final   Special Requests   Final    BOTTLES DRAWN AEROBIC AND ANAEROBIC Blood Culture adequate volume   Culture   Final    NO GROWTH < 12 HOURS Performed at [facility], [address] [place], [place] [address]    Report Status PENDING  Incomplete  Culture, blood (routine x 2)     Status: None (Preliminary result)   Collection Time: 05/20/21 12:29 AM   Specimen: BLOOD  Result Value Ref Range Status   Specimen Description BLOOD LEFT ANTECUBITAL  Final   Special Requests   Final    BOTTLES DRAWN AEROBIC AND ANAEROBIC Blood Culture adequate volume   Culture   Final    NO GROWTH < 12 HOURS Performed at [facility], [address] [place], [place] [address]    Report Status PENDING  Incomplete  Resp Panel by RT-PCR (Flu A&B, Covid) Nasopharyngeal Swab     Status: None   Collection Time: 05/20/21 12:29 AM   Specimen: Nasopharyngeal Swab; Nasopharyngeal(NP) swabs in vial transport medium  Result Value Ref Range Status   SARS Coronavirus 2 by RT PCR NEGATIVE NEGATIVE Final    Comment: (NOTE) SARS-CoV-2 target nucleic acids are NOT DETECTED.  The SARS-CoV-2 RNA is generally detectable in upper respiratory specimens during the acute phase of infection. The lowest concentration of SARS-CoV-2 viral copies this assay can detect is 138 copies/mL. A negative result does not  preclude SARS-Cov-2 infection and should not be used as the sole basis for treatment or other patient management decisions. A negative result may occur with  improper specimen collection/handling, submission of specimen other than nasopharyngeal swab, presence of viral mutation(s) within the areas targeted by this assay, and inadequate number of viral copies(<138 copies/mL). A negative result must be combined with clinical observations, patient history, and epidemiological information. The expected result is Negative.  Fact Sheet for Patients:  EntrepreneurPulse.com.au  Fact Sheet for Healthcare Providers:  IncredibleEmployment.be  This test is no t yet approved or cleared by the [place] FDA and  has been authorized for detection and/or diagnosis of SARS-CoV-2 by FDA under an Emergency Use Authorization (EUA). This EUA will remain  in effect (meaning this test can be used) for the duration of the COVID-19 declaration under Section 564(b)(1) of the Act, 21 U.S.C.section 360bbb-3(b)(1), unless the authorization is terminated  or revoked sooner.  Influenza A by PCR NEGATIVE NEGATIVE Final   Influenza B by PCR NEGATIVE NEGATIVE Final    Comment: (NOTE) The Xpert Xpress SARS-CoV-2/FLU/RSV plus assay is intended as an aid in the diagnosis of influenza from Nasopharyngeal swab specimens and should not be used as a sole basis for treatment. Nasal washings and aspirates are unacceptable for Xpert Xpress SARS-CoV-2/FLU/RSV testing.  Fact Sheet for Patients: EntrepreneurPulse.com.au  Fact Sheet for Healthcare Providers: IncredibleEmployment.be  This test is not yet approved or cleared by the [place] FDA and has been authorized for detection and/or diagnosis of SARS-CoV-2 by FDA under an Emergency Use Authorization (EUA). This EUA will remain in effect (meaning this test can be used) for the  duration of the COVID-19 declaration under Section 564(b)(1) of the Act, 21 U.S.C. section 360bbb-3(b)(1), unless the authorization is terminated or revoked.  Performed at [facility], [place], [place] [address]   C Difficile Quick Screen w PCR reflex     Status: None   Collection Time: 05/20/21 12:29 AM   Specimen: Stool  Result Value Ref Range Status   C Diff antigen NEGATIVE NEGATIVE Final   C Diff toxin NEGATIVE NEGATIVE Final   C Diff interpretation No C. difficile detected.  Final    Comment: Performed at [facility], [address] [place], [place] [address]  Gastrointestinal Panel by PCR , Stool     Status: Abnormal   Collection Time: 05/20/21 12:29 AM   Specimen: Stool  Result Value Ref Range Status   Campylobacter species DETECTED (A) NOT DETECTED Final    Comment: RESULT CALLED TO, READ BACK BY AND VERIFIED WITH: GRACIE WINEMAN @0356  ON 05/20/21 SKL    Plesimonas shigelloides NOT DETECTED NOT DETECTED Final   Salmonella species NOT DETECTED NOT DETECTED Final   Yersinia enterocolitica NOT DETECTED NOT DETECTED Final   Vibrio species NOT DETECTED NOT DETECTED Final   Vibrio cholerae NOT DETECTED NOT DETECTED Final   Enteroaggregative E coli (EAEC) NOT DETECTED NOT DETECTED Final   Enterotoxigenic E coli (ETEC) NOT DETECTED NOT DETECTED Final   Shiga like toxin producing E coli (STEC) DETECTED (A) NOT DETECTED Final    Comment: RESULT CALLED TO, READ BACK BY AND VERIFIED WITH: GRACIE WINEMAN @0356  ON 05/20/21 SKL    E. coli O157 NOT DETECTED NOT DETECTED Final   Shigella/Enteroinvasive E coli (EIEC) NOT DETECTED NOT DETECTED Final   Cryptosporidium NOT DETECTED NOT DETECTED Final   Cyclospora cayetanensis NOT DETECTED NOT DETECTED Final   Entamoeba histolytica NOT DETECTED NOT DETECTED Final   Giardia lamblia NOT DETECTED NOT DETECTED Final   Adenovirus F40/41 NOT DETECTED NOT DETECTED Final   Astrovirus NOT DETECTED NOT DETECTED  Final   Norovirus GI/GII NOT DETECTED NOT DETECTED Final   Rotavirus A NOT DETECTED NOT DETECTED Final   Sapovirus (I, II, IV, and V) NOT DETECTED NOT DETECTED Final    Comment: Performed at [facility], [address] [place], [place] [address]     Radiology Studies: DG Chest 2 View  Result Date: 05/20/2021 CLINICAL DATA:  Chest pain.  Diarrhea.  Chills. EXAM: CHEST - 2 VIEW COMPARISON:  Chest x-ray 07/16/2020, CT chest 06/02/2020 FINDINGS: The heart size and mediastinal contours are within normal limits. Redemonstration of left base calcified nodule. No focal consolidation. No pulmonary edema. No pleural effusion. No pneumothorax. No acute osseous abnormality. Multilevel degenerative changes of the thoracic spine. Right upper quadrant surgical clips. IMPRESSION: No active cardiopulmonary disease. Electronically  Signed   By: Iven Finn M.D.   On: 05/20/2021 00:05   CT CHEST ABDOMEN PELVIS WO CONTRAST  Result Date: 05/20/2021 CLINICAL DATA:  Cough. Persistent pneumonia. Effusion or abscess suspected. Vomiting diarrhea and chills. EXAM: CT CHEST, ABDOMEN AND PELVIS WITHOUT CONTRAST TECHNIQUE: Multidetector CT imaging of the chest, abdomen and pelvis was performed following the standard protocol without IV contrast. COMPARISON:  CT angiography chest abdomen pelvis 06/02/2020, CT angio chest 02/13/2017 FINDINGS: CT CHEST FINDINGS Cardiovascular: Normal heart size. No significant pericardial effusion. The thoracic aorta is normal in caliber. Mild atherosclerotic plaque of the thoracic aorta. Four-vessel coronary artery calcifications. Mediastinum/Nodes: Prominent but nonenlarged paraesophageal lymph node (2:41). No gross hilar adenopathy, noting limited sensitivity for the detection of hilar adenopathy on this noncontrast study. No enlarged mediastinal or axillary lymph nodes. Grossly unremarkable thyroid gland with right thyroid gland slightly heterogeneous with possible punctate  calcification. Otherwise the trachea and esophagus demonstrate no significant findings. Small hiatal hernia. Lungs/Pleura: Bilateral lower lobe subsegmental atelectasis. No focal consolidation. Stable centrally calcified nodule at the left posterior base of the left lower lobe measuring 7 mm likely representing a granuloma. Calcified subpleural micronodule also noted within left lower lobe (4:103). Pulmonary micronodule within the left upper lobe (4:35). All of these are stable from prior likely represents sequelae of prior granulomatous disease. No new pulmonary nodule. No pulmonary mass. No pleural effusion. No pneumothorax. Musculoskeletal: No chest wall abnormality. No suspicious lytic or blastic osseous lesions. No acute displaced fracture. Multilevel degenerative changes of the spine. CT ABDOMEN PELVIS FINDINGS Hepatobiliary: Couple Catholic a shins within the right hepatic lobe likely sequelae of prior granulomatous disease. Diffusely atrophic. No focal lesion. Otherwise normal pancreatic contour. No surrounding inflammatory changes. No main pancreatic ductal dilatation. Pancreas: Normal in size without focal abnormality. Spleen: Several punctate calcifications within the splenic parenchyma likely sequelae of prior granulomatous disease. A splenule is noted (2:53). Normal in size without focal abnormality. Adrenals/Urinary Tract: No adrenal nodule bilaterally. Bilateral renal cortical scarring. Nonspecific trace bilateral perinephric stranding. No nephrolithiasis, no hydronephrosis, and no contour-deforming renal mass. No ureterolithiasis or hydroureter. The urinary bladder is unremarkable. Stomach/Bowel: Stomach is within normal limits. No evidence of bowel wall thickening or dilatation. Few scattered colonic diverticula. The appendix not identified and likely surgically absent. Vascular/Lymphatic: No abdominal aorta or iliac aneurysm. Mild atherosclerotic plaque of the aorta and its branches. Partially  calcified right mid abdominal lymph node. No abdominal, pelvic, or inguinal lymphadenopathy. Reproductive: The prostate is prominent in size. Other: No intraperitoneal free fluid. No intraperitoneal free gas. No organized fluid collection. Musculoskeletal: Bilateral small to moderate volume fat containing inguinal hernias. No suspicious lytic or blastic osseous lesions. No acute displaced fracture. Multilevel degenerative changes of the spine. IMPRESSION: 1. No acute intrathoracic, intra-abdominal, intrapelvic abnormality with limited evaluation on this noncontrast study. Other imaging findings of potential clinical significance: 1. Sequela of prior granulomatous disease. 2. Small hiatal hernia. 3. Few scattered colonic diverticula with no acute diverticulitis. 4. Prostatomegaly. 5. Aortic Atherosclerosis (ICD10-I70.0) including four-vessel coronary artery calcifications. 6. Bilateral small to moderate volume fat containing umbilical hernias. Electronically Signed   By: Iven Finn M.D.   On: 05/20/2021 01:09   CT CHEST ABDOMEN PELVIS WO CONTRAST  Final Result    DG Chest 2 View  Final Result      Scheduled Meds: . enoxaparin (LOVENOX) injection  40 mg Subcutaneous Q24H   PRN Meds: acetaminophen, HYDROcodone-acetaminophen, ondansetron **OR** ondansetron (ZOFRAN) IV Continuous Infusions: . lactated ringers 125 mL/hr  at 05/20/21 1126     LOS: 0 days  Time spent: Greater than 50% of the 35 minute visit was spent in counseling/coordination of care for the patient as laid out in the A&P.   Dwyane Dee, MD [facility] 05/20/2021, 3:44 PM

## 2021-05-20 NOTE — Progress Notes (Signed)
PHARMACY -  BRIEF ANTIBIOTIC NOTE   Pharmacy has received consult(s) for Vancomycin, Cefepime from an ED provider.  The patient's profile has been reviewed for ht/wt/allergies/indication/available labs.    One time order(s) placed for Vancomycin 1750 mg IV X 1 and Cefepime 2 gm IV X 1.   Further antibiotics/pharmacy consults should be ordered by admitting physician if indicated.                       Thank you, Mikeya Tomasetti D 05/20/2021  2:23 AM

## 2021-05-20 NOTE — Consult Note (Signed)
[facility]       Reason for Consult: diarrhea    Referring Physician: Dr. Sabino Gasser  Principal Problem:   Sepsis [facility]) Active Problems:   Chronic bronchitis ([place])   Hypertension   OSA (obstructive sleep apnea)   Acute gastroenteritis   . enoxaparin (LOVENOX) injection  40 mg Subcutaneous Q24H    Recommendations: Observe off of antibiotics Continue supportive care  Assessment: He has symptoms of a GI illness with diarrhea, n/v and dehydration with an elevated BUN, lactate.  GI pathogen panel noted and not sure if these pathogens are causing his issues, doubt both are.  One is STEC and always of concern if getting antibiotics, though O157 is negative.  No test for other associated non-o157 toxins available.  He has already received treatment with cefepime.  Antibiotics not typically indicated for Campylobacter.    Call with questions  Antibiotics: Cefepime, vancomycin, metronidazole  HPI: Brett Blake is a 80 y.o. male with a history of COPD, HTN, OSA who came in after an episode of fever and chills associated with diarrhea and dry heaves.  He had been having diarrhea of about 3-4 watery, loose stools about 2 days before this episode happened while at church.  He has had one loose stool since coming in overnight.  No fever here.  WBC wnl.  GI pathogen panel was sent and positive for campylobacter and STEC. Recently treated with levaquin and steroids for COPD/ acute exacerbation of chronic bronchitis.  C diff negative. Started on vancomycin, cefepime, metronidazole for concern of sepsis based on elevated lactate and tachycardia.  He does not eat unpasteurized products.  Only food source was a restaurant that he had a fried fish dish from. No blood in stool noted.    Review of Systems:  Constitutional: positive for fevers, chills, malaise and anorexia Integument/breast: negative for rash Musculoskeletal: negative for myalgias and arthralgias All  other systems reviewed and are negative    Past Medical History:  Diagnosis Date  . Anxiety   . Arthritis   . Asthma   . Colon polyps   . COPD (chronic obstructive pulmonary disease) ([place])   . Depression   . Diabetes ([place])   . Difficult intubation   . GERD (gastroesophageal reflux disease)   . High cholesterol   . Hyperlipidemia   . Hypertension   . Sleep apnea     Social History   Tobacco Use  . Smoking status: Former Smoker    Packs/day: 1.00    Years: 15.00    Pack years: 15.00    Types: Cigarettes  . Smokeless tobacco: Never Used  . Tobacco comment: quit 1977  Substance Use Topics  . Alcohol use: Yes    Alcohol/week: 1.0 - 2.0 standard drink    Types: 1 - 2 Shots of liquor per week  . Drug use: No    Family History  Problem Relation Age of Onset  . Heart attack Mother   . Prostate cancer Father   . Leukemia Brother   . Prostate cancer Brother     Allergies  Allergen Reactions  . Ace Inhibitors Cough  . Metoprolol Diarrhea  . Sulfa Antibiotics Rash    Physical Exam: Constitutional: in no apparent distress  Vitals:   05/20/21 0930 05/20/21 1115  BP: (!) 150/76 (!) 164/86  Pulse: 92 77  Resp: 14 16  Temp:  98 F (36.7 C)  SpO2: 96% 99%   EYES: anicteric Cardiovascular: Cor RRR Respiratory:  clear; KG:URKYH, soft, + bowel sounds, no rebound or guarding Musculoskeletal: no pedal edema noted Skin: negatives: no rash Neuro: non-focal  Lab Results  Component Value Date   WBC 8.0 05/19/2021   HGB 13.4 05/19/2021   HCT 39.6 05/19/2021   MCV 95.0 05/19/2021   PLT 175 05/19/2021    Lab Results  Component Value Date   CREATININE 1.12 05/19/2021   BUN 26 (H) 05/19/2021   NA 137 05/19/2021   K 3.3 (L) 05/19/2021   CL 100 05/19/2021   CO2 22 05/19/2021    Lab Results  Component Value Date   ALT 28 05/19/2021   AST 35 05/19/2021   ALKPHOS 71 05/19/2021     Microbiology: Recent Results (from the past 240 hour(s))  Culture, blood  (routine x 2)     Status: None (Preliminary result)   Collection Time: 05/20/21 12:29 AM   Specimen: BLOOD  Result Value Ref Range Status   Specimen Description BLOOD RIGHT ANTECUBITAL  Final   Special Requests   Final    BOTTLES DRAWN AEROBIC AND ANAEROBIC Blood Culture adequate volume   Culture   Final    NO GROWTH < 12 HOURS Performed at [facility], [address], [place], [place] [address]    Report Status PENDING  Incomplete  Culture, blood (routine x 2)     Status: None (Preliminary result)   Collection Time: 05/20/21 12:29 AM   Specimen: BLOOD  Result Value Ref Range Status   Specimen Description BLOOD LEFT ANTECUBITAL  Final   Special Requests   Final    BOTTLES DRAWN AEROBIC AND ANAEROBIC Blood Culture adequate volume   Culture   Final    NO GROWTH < 12 HOURS Performed at [facility], [address], [place], [place] [address]    Report Status PENDING  Incomplete  Resp Panel by RT-PCR (Flu A&B, Covid) Nasopharyngeal Swab     Status: None   Collection Time: 05/20/21 12:29 AM   Specimen: Nasopharyngeal Swab; Nasopharyngeal(NP) swabs in vial transport medium  Result Value Ref Range Status   SARS Coronavirus 2 by RT PCR NEGATIVE NEGATIVE Final    Comment: (NOTE) SARS-CoV-2 target nucleic acids are NOT DETECTED.  The SARS-CoV-2 RNA is generally detectable in upper respiratory specimens during the acute phase of infection. The lowest concentration of SARS-CoV-2 viral copies this assay can detect is 138 copies/mL. A negative result does not preclude SARS-Cov-2 infection and should not be used as the sole basis for treatment or other patient management decisions. A negative result may occur with  improper specimen collection/handling, submission of specimen other than nasopharyngeal swab, presence of viral mutation(s) within the areas targeted by this assay, and inadequate number of viral copies(<138 copies/mL). A negative result must be combined  with clinical observations, patient history, and epidemiological information. The expected result is Negative.  Fact Sheet for Patients:  EntrepreneurPulse.com.au  Fact Sheet for Healthcare Providers:  IncredibleEmployment.be  This test is no t yet approved or cleared by the [place] FDA and  has been authorized for detection and/or diagnosis of SARS-CoV-2 by FDA under an Emergency Use Authorization (EUA). This EUA will remain  in effect (meaning this test can be used) for the duration of the COVID-19 declaration under Section 564(b)(1) of the Act, 21 U.S.C.section 360bbb-3(b)(1), unless the authorization is terminated  or revoked sooner.       Influenza A by PCR NEGATIVE NEGATIVE Final   Influenza B by PCR NEGATIVE NEGATIVE Final  Comment: (NOTE) The Xpert Xpress SARS-CoV-2/FLU/RSV plus assay is intended as an aid in the diagnosis of influenza from Nasopharyngeal swab specimens and should not be used as a sole basis for treatment. Nasal washings and aspirates are unacceptable for Xpert Xpress SARS-CoV-2/FLU/RSV testing.  Fact Sheet for Patients: EntrepreneurPulse.com.au  Fact Sheet for Healthcare Providers: IncredibleEmployment.be  This test is not yet approved or cleared by the [place] FDA and has been authorized for detection and/or diagnosis of SARS-CoV-2 by FDA under an Emergency Use Authorization (EUA). This EUA will remain in effect (meaning this test can be used) for the duration of the COVID-19 declaration under Section 564(b)(1) of the Act, 21 U.S.C. section 360bbb-3(b)(1), unless the authorization is terminated or revoked.  Performed at [facility], [place], [place] [address]   C Difficile Quick Screen w PCR reflex     Status: None   Collection Time: 05/20/21 12:29 AM   Specimen: Stool  Result Value Ref Range Status   C Diff antigen NEGATIVE  NEGATIVE Final   C Diff toxin NEGATIVE NEGATIVE Final   C Diff interpretation No C. difficile detected.  Final    Comment: Performed at [facility], [address] [place], [place] [address]  Gastrointestinal Panel by PCR , Stool     Status: Abnormal   Collection Time: 05/20/21 12:29 AM   Specimen: Stool  Result Value Ref Range Status   Campylobacter species DETECTED (A) NOT DETECTED Final    Comment: RESULT CALLED TO, READ BACK BY AND VERIFIED WITH: GRACIE WINEMAN @0356  ON 05/20/21 SKL    Plesimonas shigelloides NOT DETECTED NOT DETECTED Final   Salmonella species NOT DETECTED NOT DETECTED Final   Yersinia enterocolitica NOT DETECTED NOT DETECTED Final   Vibrio species NOT DETECTED NOT DETECTED Final   Vibrio cholerae NOT DETECTED NOT DETECTED Final   Enteroaggregative E coli (EAEC) NOT DETECTED NOT DETECTED Final   Enterotoxigenic E coli (ETEC) NOT DETECTED NOT DETECTED Final   Shiga like toxin producing E coli (STEC) DETECTED (A) NOT DETECTED Final    Comment: RESULT CALLED TO, READ BACK BY AND VERIFIED WITH: GRACIE WINEMAN @0356  ON 05/20/21 SKL    E. coli O157 NOT DETECTED NOT DETECTED Final   Shigella/Enteroinvasive E coli (EIEC) NOT DETECTED NOT DETECTED Final   Cryptosporidium NOT DETECTED NOT DETECTED Final   Cyclospora cayetanensis NOT DETECTED NOT DETECTED Final   Entamoeba histolytica NOT DETECTED NOT DETECTED Final   Giardia lamblia NOT DETECTED NOT DETECTED Final   Adenovirus F40/41 NOT DETECTED NOT DETECTED Final   Astrovirus NOT DETECTED NOT DETECTED Final   Norovirus GI/GII NOT DETECTED NOT DETECTED Final   Rotavirus A NOT DETECTED NOT DETECTED Final   Sapovirus (I, II, IV, and V) NOT DETECTED NOT DETECTED Final    Comment: Performed at [facility], [address] [place], [place] [address]    Jessen Siegman W Harlene Petralia, [place] for Infectious Disease [facility] [email or web address] 05/20/2021, 1:31 PM

## 2021-05-20 NOTE — ED Provider Notes (Signed)
[facility] Emergency Department Provider Note   ____________________________________________   Event Date/Time   First MD Initiated Contact with Patient 05/20/21 0009     (approximate)  I have reviewed the triage vital signs and the nursing notes.   HISTORY  Chief Complaint Emesis, Diarrhea, Chills, and Chest Pain    HPI Brett Blake is a 80 y.o. male who presents to the ED from home with a chief complaint of fever, chills, cough, chest pain, nausea, dry heaving and diarrhea.  Used his inhaler without relief of symptoms.  Patient reported to registration when he checked and that he was having a seizure.  He was noted to be awake and conversive at the time without a postictal period.  History of pseudoseizures. Patient reports mid central chest pain that he "always has" and attributes it to lung infection for which his PCP gives him Levaquin to clear up.  Denies abdominal pain, dysuria.  Denies recent travel, trauma or antibiotic use.     Past Medical History:  Diagnosis Date  . Anxiety   . Arthritis   . Asthma   . Colon polyps   . COPD (chronic obstructive pulmonary disease) ([place])   . Depression   . Diabetes ([place])   . Difficult intubation   . GERD (gastroesophageal reflux disease)   . High cholesterol   . Hyperlipidemia   . Hypertension   . Sleep apnea     Patient Active Problem List   Diagnosis Date Noted  . Sepsis ([place]) 05/20/2021  . OSA (obstructive sleep apnea) 05/20/2021  . Acute gastroenteritis 05/20/2021  . Chest pain 07/16/2020  . Depression   . Cholecystitis 05/08/2019  . Paroxysmal tachycardia ([place]) 02/23/2017  . Colon polyp 06/29/2016  . Hypertension 06/29/2016  . Elevated PSA 06/29/2016  . 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 Controlled type 2 diabetes mellitus without complication ([place]) 16/38/4665  . 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 Chronic insomnia 09/03/2015  . Chronic bronchitis ([place])  12/14/2011    Past Surgical History:  Procedure Laterality Date  . BRONCHOSCOPY    . CARDIAC CATHETERIZATION Left 11/04/2016   Procedure: Left Heart Cath and Coronary Angiography;  Surgeon: Teodoro Spray, MD;  Location: [place] [facility];  Service: Cardiovascular;  Laterality: Left;  . CARDIAC CATHETERIZATION    . CHOLECYSTECTOMY N/A 05/09/2019   Procedure: LAPAROSCOPIC CHOLECYSTECTOMY WITH INTRAOPERATIVE CHOLANGIOGRAM;  Surgeon: Jules Husbands, MD;  Location: [facility] ORS;  Service: General;  Laterality: N/A;  . COLOSTOMY    . ROTATOR CUFF REPAIR Left   . THUMB ARTHROSCOPY  2015  . TONSILLECTOMY  1952  . TONSILLECTOMY      Prior to Admission medications   Medication Sig Start Date End Date Taking? Authorizing Provider  albuterol (ACCUNEB) 1.25 MG/3ML nebulizer solution TAKE 3 MLS (1.25 MG TOTAL) BY NEBULIZATION EVERY 6 (SIX) HOURS AS NEEDED FOR WHEEZING 04/08/19  Yes [provider]  ALPRAZolam (XANAX) 0.5 MG tablet Take 0.5 tablets by mouth daily as needed. 03/05/19  Yes [provider]  amitriptyline (ELAVIL) 10 MG tablet Take 10 mg by mouth at bedtime. 07/02/20  Yes [provider]  aspirin 81 MG chewable tablet Chew 81 mg by mouth daily.   Yes [provider]  bisoprolol (ZEBETA) 5 MG tablet Take 2.5 mg by mouth 2 (two) times daily. 07/02/20  Yes [provider]  diltiazem (CARDIZEM CD) 180 MG 24 hr capsule Take 1 capsule by mouth 2 (two) times a day.  05/02/19  Yes [provider]  diltiazem (TIAZAC) 180 MG 24 hr capsule Take by mouth. 03/08/21 03/08/22 Yes [provider]  famotidine (PEPCID) 40 MG tablet Take 40 mg by mouth at bedtime. 06/05/20  Yes [provider]  fluticasone (FLONASE) 50 MCG/ACT nasal spray Place 1 spray into both nostrils daily as needed for allergies or rhinitis.   Yes [provider]  glucosamine-chondroitin 500-400 MG tablet Take 1 tablet by mouth daily.   Yes [provider]   ibuprofen (ADVIL,MOTRIN) 200 MG tablet Take 200 mg by mouth every 6 (six) hours as needed.   Yes [provider]  isosorbide mononitrate (IMDUR) 30 MG 24 hr tablet Take 30 mg by mouth daily. 06/16/20  Yes [provider]  montelukast (SINGULAIR) 10 MG tablet Take 10 mg by mouth at bedtime.   Yes [provider]  Multiple Vitamins-Minerals (MULTIVITAMIN PO) Take 1 tablet by mouth daily.   Yes [provider]  omeprazole (PRILOSEC) 40 MG capsule Take 40 mg by mouth daily. 06/05/20  Yes [provider]  primidone (MYSOLINE) 50 MG tablet Take 50 mg by mouth daily. 04/22/21  Yes [provider]  simvastatin (ZOCOR) 20 MG tablet Take 20 mg by mouth daily.   Yes [provider]  SYMBICORT 80-4.5 MCG/ACT inhaler Inhale 2 puffs into the lungs 2 (two) times daily as needed.  05/19/20  Yes [provider]  triamterene-hydrochlorothiazide (DYAZIDE) 37.5-25 MG per capsule Take 1 capsule by mouth daily.   Yes [provider]  donepezil (ARICEPT) 5 MG tablet Take 5 mg by mouth at bedtime.  05/14/20 05/14/21  [provider]  nitroGLYCERIN (NITROSTAT) 0.4 MG SL tablet Place 0.4 mg under the tongue every 5 (five) minutes as needed for chest pain.    [provider]  sertraline (ZOLOFT) 25 MG tablet Take 50 mg by mouth daily. Patient not taking: No sig reported 02/28/21   [provider]    Allergies Ace inhibitors, Metoprolol, and Sulfa antibiotics  Family History  Problem Relation Age of Onset  . Heart attack Mother   . Prostate cancer Father   . Leukemia Brother   . Prostate cancer Brother     Social History Social History   Tobacco Use  . Smoking status: Former Smoker    Packs/day: 1.00    Years: 15.00    Pack years: 15.00    Types: Cigarettes  . Smokeless tobacco: Never Used  . Tobacco comment: quit 1977  Substance Use Topics  . Alcohol use: Yes    Alcohol/week: 1.0 - 2.0 standard drink     Types: 1 - 2 Shots of liquor per week  . Drug use: No    Review of Systems  Constitutional: Positive for fever/chills Eyes: No visual changes. ENT: No sore throat. Cardiovascular: Positive for chest pain. Respiratory: Positive for cough.  Denies shortness of breath. Gastrointestinal: No abdominal pain.  Positive for nausea, no vomiting.  Positive for diarrhea.  No constipation. Genitourinary: Negative for dysuria. Musculoskeletal: Negative for back pain. Skin: Negative for rash. Neurological: Negative for headaches, focal weakness or numbness.   ____________________________________________   PHYSICAL EXAM:  VITAL SIGNS: ED Triage Vitals  Enc Vitals Group     BP 05/19/21 2341 (!) 155/104     Pulse Rate 05/19/21 2341 (!) 140     Resp 05/19/21 2341 20     Temp 05/19/21 2341 98.6 F (37 C)     Temp Source 05/19/21 2341 Oral  SpO2 05/19/21 2341 97 %     Weight 05/19/21 2346 170 lb (77.1 kg)     Height 05/19/21 2346 5\' 5"  (1.651 m)     Head Circumference --      Peak Flow --      Pain Score 05/19/21 2342 3     Pain Loc --      Pain Edu? --      Excl. in [place]? --     Constitutional: Alert and oriented.  Elderly appearing, hard of hearing and in no acute distress. Eyes: Conjunctivae are normal. PERRL. EOMI. Head: Atraumatic. Nose: No congestion/rhinnorhea. Mouth/Throat: Mucous membranes are moist.   Neck: No stridor.  Supple neck without meningismus. Cardiovascular: Tachycardic rate, regular rhythm. Grossly normal heart sounds.  Good peripheral circulation. Respiratory: Normal respiratory effort.  No retractions. Lungs CTAB. Gastrointestinal: Soft and nontender to light or deep palpation. No distention. No abdominal bruits. No CVA tenderness. Musculoskeletal: No lower extremity tenderness nor edema.  No joint effusions. Neurologic: Alert and oriented x3.  CN II to XII grossly intact.  Normal speech and language. No gross focal neurologic deficits are appreciated.  Skin:   Skin is hot, dry and intact. No rash noted.  No petechiae. Psychiatric: Mood and affect are normal. Speech and behavior are normal.  ____________________________________________   LABS (all labs ordered are listed, but only abnormal results are displayed)  Labs Reviewed  GASTROINTESTINAL PANEL BY PCR, STOOL (REPLACES STOOL CULTURE) - Abnormal; Notable for the following components:      Result Value   Campylobacter species DETECTED (*)    Shiga like toxin producing E coli (STEC) DETECTED (*)    All other components within normal limits  COMPREHENSIVE METABOLIC PANEL - Abnormal; Notable for the following components:   Potassium 3.3 (*)    Glucose, Bld 153 (*)    BUN 26 (*)    All other components within normal limits  URINALYSIS, COMPLETE (UACMP) WITH MICROSCOPIC - Abnormal; Notable for the following components:   Color, Urine YELLOW (*)    APPearance CLEAR (*)    Hgb urine dipstick LARGE (*)    RBC / HPF >50 (*)    All other components within normal limits  CBC WITH DIFFERENTIAL/PLATELET - Abnormal; Notable for the following components:   RBC 4.17 (*)    Lymphs Abs 0.5 (*)    All other components within normal limits  LACTIC ACID, PLASMA - Abnormal; Notable for the following components:   Lactic Acid, Venous 4.0 (*)    All other components within normal limits  LACTIC ACID, PLASMA - Abnormal; Notable for the following components:   Lactic Acid, Venous 3.8 (*)    All other components within normal limits  RESP PANEL BY RT-PCR (FLU A&B, COVID) ARPGX2  C DIFFICILE QUICK SCREEN W PCR REFLEX  CULTURE, BLOOD (ROUTINE X 2)  CULTURE, BLOOD (ROUTINE X 2)  URINE CULTURE  LIPASE, BLOOD  PROCALCITONIN  LACTIC ACID, PLASMA  TROPONIN I (HIGH SENSITIVITY)  TROPONIN I (HIGH SENSITIVITY)   ____________________________________________  EKG  ED ECG REPORT I, Aaima Gaddie J, the attending physician, personally viewed and interpreted this ECG.   Date: 05/20/2021  EKG Time: 2347   Rate: 139  Rhythm: sinus tachycardia  Axis: Normal  Intervals:none  ST&T Change: Nonspecific  ____________________________________________  RADIOLOGY I, Rielly Brunn J, personally viewed and evaluated these images (plain radiographs) as part of my medical decision making, as well as reviewing the written report by the radiologist.  ED MD interpretation: No acute  cardiopulmonary process; unremarkable CT chest/abdomen/pelvis  Official radiology report(s): DG Chest 2 View  Result Date: 05/20/2021 CLINICAL DATA:  Chest pain.  Diarrhea.  Chills. EXAM: CHEST - 2 VIEW COMPARISON:  Chest x-ray 07/16/2020, CT chest 06/02/2020 FINDINGS: The heart size and mediastinal contours are within normal limits. Redemonstration of left base calcified nodule. No focal consolidation. No pulmonary edema. No pleural effusion. No pneumothorax. No acute osseous abnormality. Multilevel degenerative changes of the thoracic spine. Right upper quadrant surgical clips. IMPRESSION: No active cardiopulmonary disease. Electronically Signed   By: Iven Finn M.D.   On: 05/20/2021 00:05   CT CHEST ABDOMEN PELVIS WO CONTRAST  Result Date: 05/20/2021 CLINICAL DATA:  Cough. Persistent pneumonia. Effusion or abscess suspected. Vomiting diarrhea and chills. EXAM: CT CHEST, ABDOMEN AND PELVIS WITHOUT CONTRAST TECHNIQUE: Multidetector CT imaging of the chest, abdomen and pelvis was performed following the standard protocol without IV contrast. COMPARISON:  CT angiography chest abdomen pelvis 06/02/2020, CT angio chest 02/13/2017 FINDINGS: CT CHEST FINDINGS Cardiovascular: Normal heart size. No significant pericardial effusion. The thoracic aorta is normal in caliber. Mild atherosclerotic plaque of the thoracic aorta. Four-vessel coronary artery calcifications. Mediastinum/Nodes: Prominent but nonenlarged paraesophageal lymph node (2:41). No gross hilar adenopathy, noting limited sensitivity for the detection of hilar adenopathy on this  noncontrast study. No enlarged mediastinal or axillary lymph nodes. Grossly unremarkable thyroid gland with right thyroid gland slightly heterogeneous with possible punctate calcification. Otherwise the trachea and esophagus demonstrate no significant findings. Small hiatal hernia. Lungs/Pleura: Bilateral lower lobe subsegmental atelectasis. No focal consolidation. Stable centrally calcified nodule at the left posterior base of the left lower lobe measuring 7 mm likely representing a granuloma. Calcified subpleural micronodule also noted within left lower lobe (4:103). Pulmonary micronodule within the left upper lobe (4:35). All of these are stable from prior likely represents sequelae of prior granulomatous disease. No new pulmonary nodule. No pulmonary mass. No pleural effusion. No pneumothorax. Musculoskeletal: No chest wall abnormality. No suspicious lytic or blastic osseous lesions. No acute displaced fracture. Multilevel degenerative changes of the spine. CT ABDOMEN PELVIS FINDINGS Hepatobiliary: Couple Catholic a shins within the right hepatic lobe likely sequelae of prior granulomatous disease. Diffusely atrophic. No focal lesion. Otherwise normal pancreatic contour. No surrounding inflammatory changes. No main pancreatic ductal dilatation. Pancreas: Normal in size without focal abnormality. Spleen: Several punctate calcifications within the splenic parenchyma likely sequelae of prior granulomatous disease. A splenule is noted (2:53). Normal in size without focal abnormality. Adrenals/Urinary Tract: No adrenal nodule bilaterally. Bilateral renal cortical scarring. Nonspecific trace bilateral perinephric stranding. No nephrolithiasis, no hydronephrosis, and no contour-deforming renal mass. No ureterolithiasis or hydroureter. The urinary bladder is unremarkable. Stomach/Bowel: Stomach is within normal limits. No evidence of bowel wall thickening or dilatation. Few scattered colonic diverticula. The appendix  not identified and likely surgically absent. Vascular/Lymphatic: No abdominal aorta or iliac aneurysm. Mild atherosclerotic plaque of the aorta and its branches. Partially calcified right mid abdominal lymph node. No abdominal, pelvic, or inguinal lymphadenopathy. Reproductive: The prostate is prominent in size. Other: No intraperitoneal free fluid. No intraperitoneal free gas. No organized fluid collection. Musculoskeletal: Bilateral small to moderate volume fat containing inguinal hernias. No suspicious lytic or blastic osseous lesions. No acute displaced fracture. Multilevel degenerative changes of the spine. IMPRESSION: 1. No acute intrathoracic, intra-abdominal, intrapelvic abnormality with limited evaluation on this noncontrast study. Other imaging findings of potential clinical significance: 1. Sequela of prior granulomatous disease. 2. Small hiatal hernia. 3. Few scattered colonic diverticula with no acute diverticulitis. 4.  Prostatomegaly. 5. Aortic Atherosclerosis (ICD10-I70.0) including four-vessel coronary artery calcifications. 6. Bilateral small to moderate volume fat containing umbilical hernias. Electronically Signed   By: Iven Finn M.D.   On: 05/20/2021 01:09    ____________________________________________   PROCEDURES  Procedure(s) performed (including Critical Care):  .1-3 Lead EKG Interpretation Performed by: Paulette Blanch, MD Authorized by: Paulette Blanch, MD     Interpretation: abnormal     ECG rate:  130   ECG rate assessment: tachycardic     Rhythm: sinus tachycardia     Ectopy: none     Conduction: normal   Comments:     Patient placed on cardiac monitor to evaluate for arrhythmias    CRITICAL CARE Performed by: Paulette Blanch   Total critical care time: 45 minutes  Critical care time was exclusive of separately billable procedures and treating other patients.  Critical care was necessary to treat or prevent imminent or life-threatening  deterioration.  Critical care was time spent personally by me on the following activities: development of treatment plan with patient and/or surrogate as well as nursing, discussions with consultants, evaluation of patient's response to treatment, examination of patient, obtaining history from patient or surrogate, ordering and performing treatments and interventions, ordering and review of laboratory studies, ordering and review of radiographic studies, pulse oximetry and re-evaluation of patient's condition.  ____________________________________________   INITIAL IMPRESSION / ASSESSMENT AND PLAN / ED COURSE  As part of my medical decision making, I reviewed the following data within the [place] notes reviewed and incorporated, Labs reviewed, EKG interpreted, Old chart reviewed, Radiograph reviewed and Notes from prior ED visits     80 year old male presenting with fever, chills, cough, chest pain, nausea and diarrhea.  Differential diagnosis includes but is not limited to sepsis, CAP, UTI, ACS, gastroenteritis, diverticulitis, etc.  Patient feels hot; repeat oral temperature 99.2 F.  Chest x-ray clear.  Will obtain septic work-up.  Obtain CT chest, abdomen/pelvis unfortunately without IV or oral contrast given the current national shortage.  Obtain COVID swab.  Initiate IV fluid resuscitation.  We will continue to monitor and reassess  Clinical Course as of 05/20/21 0519  Thu May 20, 2021  0218 CT scans unremarkable.  Lactic acid initially returned at 4.  Given elevated lactic acid coupled with tachycardia, will will call code sepsis.  Initiate 30 cc/kg IV fluids, broad spectrum IV antibiotics. [JS]    Clinical Course User Index [JS] Paulette Blanch, MD     ____________________________________________   FINAL CLINICAL IMPRESSION(S) / ED DIAGNOSES  Final diagnoses:  Sepsis, due to unspecified organism, unspecified whether acute organ dysfunction present ([place])   Nausea vomiting and diarrhea  Shigella gastroenteritis  Campylobacter enteritis     ED Discharge Orders    None      *Please note:  Brett Blake was evaluated in Emergency Department on 05/20/2021 for the symptoms described in the history of present illness. He was evaluated in the context of the global COVID-19 pandemic, which necessitated consideration that the patient might be at risk for infection with the SARS-CoV-2 virus that causes COVID-19. Institutional protocols and algorithms that pertain to the evaluation of patients at risk for COVID-19 are in a state of rapid change based on information released by regulatory bodies including the CDC and federal and state organizations. These policies and algorithms were followed during the patient's care in the ED.  Some ED evaluations and interventions may be delayed as a result of  limited staffing during and the pandemic.*   Note:  This document was prepared using Dragon voice recognition software and may include unintentional dictation errors.   Paulette Blanch, MD 05/20/21 [phone]

## 2021-05-20 NOTE — ED Notes (Signed)
Pt to ct 

## 2021-05-20 NOTE — ED Notes (Signed)
Pt given blanket at this time.

## 2021-05-20 NOTE — ED Notes (Signed)
[place] RN aware of assigned bed

## 2021-05-20 NOTE — Progress Notes (Signed)
CODE SEPSIS - PHARMACY COMMUNICATION  **Broad Spectrum Antibiotics should be administered within 1 hour of Sepsis diagnosis**  Time Code Sepsis Called/Page Received:  5/26 @ 0218   Antibiotics Ordered: Cefepime   Time of 1st antibiotic administration: Cefepime 2 gm IV x 1 on 5/27 @ 0239   Additional action taken by pharmacy:   If necessary, Name of Provider/Nurse Contacted:     Nastasha Reising D ,PharmD Clinical Pharmacist  05/20/2021  3:10 AM

## 2021-05-20 NOTE — TOC Progression Note (Signed)
Transition of Care [facility]) - Progression Note    Patient Details  Name: Brett Blake MRN: [id number] Date of Birth: 24-May-1941  [facility]) CM/SW [place], RN Phone Number: 05/20/2021, 3:14 PM  Clinical Narrative:   TOC in to see patient, granddaughter at bedside.  Patient lives at home with his wife, has no concerns about getting to appointments or getting medications.  Patient currently has no home health or DME.  Daughter and granddaughter live 10 minutes away and can assist patient and spouse if needed.  Patient does not anticipate having any discharge needs, TOC contact information given, TOC to follow through discharge.         Expected Discharge Plan and Services                                                 Social Determinants of Health (SDOH) Interventions    Readmission Risk Interventions No flowsheet data found.

## 2021-05-20 NOTE — Progress Notes (Signed)
Code Sepsis initiated @ 7680 AM, [place] following.

## 2021-05-20 NOTE — H&P (Signed)
History and Physical    Brett Blake UJW:[id number] DOB: 01-23-1941 DOA: 05/19/2021  PCP: [name], MD   Patient coming from: home  I have personally briefly reviewed patient's old medical records in [place]  Chief Complaint: fever, vomiting and diarrhea  HPI: Brett Blake is a 80 y.o. male with medical history significant for HTN, COPD, OSA, anxiety, who presents to the ED with sudden onset chills followed by fever of 101, cough, nausea with dry heaving and diarrhea, all developing over period of 5 to 6 hours hours.  Vomiting was mostly clear mucus, nonbloody nonbilious, and stool was loose and runny.  He denied affected contacts and ate nothing out of the ordinary.  He denied chest pain or shortness of breath.  Denied abdominal pain.  Temperature at home was 101. ED course: On arrival, afebrile at 98.6, BP 155/104, pulse 140 with O2 sat 97% on room air.  WBC normal but with elevated lactic acid 4.0-3.8.  CMP unremarkable, lipase normal, troponin negative at 13, urinalysis unremarkable.  COVID and flu negative.  C. difficile negative EKG, personally viewed and interpreted: Sinus tachycardia at 139 Imaging: CT chest abdomen pelvis without contrast, no acute intrathoracic, intra-abdominal, intrapelvic abnormality  Patient started on sepsis fluids as well as sepsis antibiotics for unknown source.  Hospitalist consulted for admission.  Review of Systems: As per HPI otherwise all other systems on review of systems negative.    Past Medical History:  Diagnosis Date  . Anxiety   . Arthritis   . Asthma   . Colon polyps   . COPD (chronic obstructive pulmonary disease) ([place])   . Depression   . Diabetes ([place])   . Difficult intubation   . GERD (gastroesophageal reflux disease)   . High cholesterol   . Hyperlipidemia   . Hypertension   . Sleep apnea     Past Surgical History:  Procedure Laterality Date  . BRONCHOSCOPY    . CARDIAC CATHETERIZATION Left 11/04/2016    Procedure: Left Heart Cath and Coronary Angiography;  Surgeon: Teodoro Spray, MD;  Location: [facility];  Service: Cardiovascular;  Laterality: Left;  . CARDIAC CATHETERIZATION    . CHOLECYSTECTOMY N/A 05/09/2019   Procedure: LAPAROSCOPIC CHOLECYSTECTOMY WITH INTRAOPERATIVE CHOLANGIOGRAM;  Surgeon: Jules Husbands, MD;  Location: [facility];  Service: General;  Laterality: N/A;  . COLOSTOMY    . ROTATOR CUFF REPAIR Left   . THUMB ARTHROSCOPY  2015  . TONSILLECTOMY  1952  . TONSILLECTOMY       reports that he has quit smoking. His smoking use included cigarettes. He has a 15.00 pack-year smoking history. He has never used smokeless tobacco. He reports Blake alcohol use of about 1.0 - 2.0 standard drink of alcohol per week. He reports that he does not use drugs.  Allergies  Allergen Reactions  . Ace Inhibitors Cough  . Metoprolol Diarrhea  . Sulfa Antibiotics Rash    Family History  Problem Relation Age of Onset  . Heart attack Mother   . Prostate cancer Father   . Leukemia Brother   . Prostate cancer Brother       Prior to Admission medications   Medication Sig Start Date End Date Taking? Authorizing Provider  albuterol (ACCUNEB) 1.25 MG/3ML nebulizer solution TAKE 3 MLS (1.25 MG TOTAL) BY NEBULIZATION EVERY 6 (SIX) HOURS AS NEEDED FOR WHEEZING 04/08/19   [provider]  ALPRAZolam Duanne Moron) 0.5 MG tablet Take 0.5 tablets by mouth daily as needed.  03/05/19   [provider]  amitriptyline (ELAVIL) 10 MG tablet Take 10 mg by mouth at bedtime. 07/02/20   [provider]  aspirin 81 MG chewable tablet Chew 81 mg by mouth daily.    [provider]  bisoprolol (ZEBETA) 5 MG tablet Take 2.5 mg by mouth 2 (two) times daily. 07/02/20   [provider]  diltiazem (CARDIZEM CD) 180 MG 24 hr capsule Take 1 capsule by mouth 2 (two) times a day. 05/02/19   [provider]  donepezil (ARICEPT) 5 MG tablet Take 5 mg by mouth at bedtime.   05/14/20 05/14/21  [provider]  famotidine (PEPCID) 40 MG tablet Take 40 mg by mouth at bedtime. 06/05/20   [provider]  fluticasone (FLONASE) 50 MCG/ACT nasal spray Place 1 spray into both nostrils daily as needed for allergies or rhinitis.    [provider]  glucosamine-chondroitin 500-400 MG tablet Take 1 tablet by mouth daily.    [provider]  ibuprofen (ADVIL,MOTRIN) 200 MG tablet Take 200 mg by mouth every 6 (six) hours as needed.    [provider]  isosorbide mononitrate (IMDUR) 30 MG 24 hr tablet Take 30 mg by mouth daily. 06/16/20   [provider]  montelukast (SINGULAIR) 10 MG tablet Take 10 mg by mouth at bedtime.    [provider]  Multiple Vitamins-Minerals (MULTIVITAMIN PO) Take 1 tablet by mouth daily.    [provider]  nitroGLYCERIN (NITROSTAT) 0.4 MG SL tablet Place 0.4 mg under the tongue every 5 (five) minutes as needed for chest pain.    [provider]  omeprazole (PRILOSEC) 40 MG capsule Take 40 mg by mouth daily. 06/05/20   [provider]  simvastatin (ZOCOR) 20 MG tablet Take 20 mg by mouth daily.    [provider]  SYMBICORT 80-4.5 MCG/ACT inhaler Inhale 2 puffs into the lungs 2 (two) times daily as needed.  05/19/20   [provider]  triamterene-hydrochlorothiazide (DYAZIDE) 37.5-25 MG per capsule Take 1 capsule by mouth daily.    [provider]    Physical Exam: Vitals:   05/20/21 0200 05/20/21 0216 05/20/21 0300 05/20/21 0330  BP: (!) 141/88  (!) 160/92 (!) 167/104  Pulse: (!) 113  (!) 121 (!) 115  Resp: 18  19 20   Temp:  98.6 F (37 C)    TempSrc:  Oral    SpO2: 94%  94% 96%  Weight:      Height:         Vitals:   05/20/21 0200 05/20/21 0216 05/20/21 0300 05/20/21 0330  BP: (!) 141/88  (!) 160/92 (!) 167/104  Pulse: (!) 113  (!) 121 (!) 115  Resp: 18  19 20   Temp:  98.6 F (37 C)    TempSrc:  Oral    SpO2: 94%  94% 96%   Weight:      Height:          Constitutional:  Ill-appearing male, alert and oriented x 3 . Not in any apparent distress HEENT:      Head: Normocephalic and atraumatic.         Eyes: PERLA, EOMI, Conjunctivae are normal. Sclera is non-icteric.       Mouth/Throat: Mucous membranes are moist.       Neck: Supple with no signs of meningismus. Cardiovascular: Regular rate and rhythm. No murmurs, gallops, or rubs. 2+ symmetrical distal pulses are present . No JVD. No LE edema Respiratory: Respiratory  effort normal .Lungs sounds clear bilaterally. No wheezes, crackles, or rhonchi.  Gastrointestinal: Soft, non tender, and non distended with positive bowel sounds.  Genitourinary: No CVA tenderness. Musculoskeletal: Nontender with normal range of motion in all extremities. No cyanosis, or erythema of extremities. Neurologic:  Face is symmetric. Moving all extremities. No gross focal neurologic deficits . Skin: Skin is warm, dry.  No rash or ulcers Psychiatric: Mood and affect are normal    Labs on Admission: I have personally reviewed following labs and imaging studies  CBC: Recent Labs  Lab 05/19/21 2354  WBC 8.0  NEUTROABS 6.5  HGB 13.4  HCT 39.6  MCV 95.0  PLT 941   Basic Metabolic Panel: Recent Labs  Lab 05/19/21 2354  NA 137  K 3.3*  CL 100  CO2 22  GLUCOSE 153*  BUN 26*  CREATININE 1.12  CALCIUM 9.2   GFR: Estimated Creatinine Clearance: 52.1 mL/min (by C-G formula based on SCr of 1.12 mg/dL). Liver Function Tests: Recent Labs  Lab 05/19/21 2354  AST 35  ALT 28  ALKPHOS 71  BILITOT 0.8  PROT 7.6  ALBUMIN 4.0   Recent Labs  Lab 05/19/21 2354  LIPASE 44   No results for input(s): AMMONIA in the last 168 hours. Coagulation Profile: No results for input(s): INR, PROTIME in the last 168 hours. Cardiac Enzymes: No results for input(s): CKTOTAL, CKMB, CKMBINDEX, TROPONINI in the last 168 hours. BNP (last 3 results) No results for input(s): PROBNP in the  last 8760 hours. HbA1C: No results for input(s): HGBA1C in the last 72 hours. CBG: No results for input(s): GLUCAP in the last 168 hours. Lipid Profile: No results for input(s): CHOL, HDL, LDLCALC, TRIG, CHOLHDL, LDLDIRECT in the last 72 hours. Thyroid Function Tests: No results for input(s): TSH, T4TOTAL, FREET4, T3FREE, THYROIDAB in the last 72 hours. Anemia Panel: No results for input(s): VITAMINB12, FOLATE, FERRITIN, TIBC, IRON, RETICCTPCT in the last 72 hours. Urine analysis:    Component Value Date/Time   COLORURINE YELLOW (A) 05/20/2021 0029   APPEARANCEUR CLEAR (A) 05/20/2021 0029   LABSPEC 1.016 05/20/2021 0029   PHURINE 5.0 05/20/2021 0029   GLUCOSEU NEGATIVE 05/20/2021 0029   HGBUR LARGE (A) 05/20/2021 0029   BILIRUBINUR NEGATIVE 05/20/2021 0029   KETONESUR NEGATIVE 05/20/2021 0029   PROTEINUR NEGATIVE 05/20/2021 0029   NITRITE NEGATIVE 05/20/2021 0029   LEUKOCYTESUR NEGATIVE 05/20/2021 0029    Radiological Exams on Admission: DG Chest 2 View  Result Date: 05/20/2021 CLINICAL DATA:  Chest pain.  Diarrhea.  Chills. EXAM: CHEST - 2 VIEW COMPARISON:  Chest x-ray 07/16/2020, CT chest 06/02/2020 FINDINGS: The heart size and mediastinal contours are within normal limits. Redemonstration of left base calcified nodule. No focal consolidation. No pulmonary edema. No pleural effusion. No pneumothorax. No acute osseous abnormality. Multilevel degenerative changes of the thoracic spine. Right upper quadrant surgical clips. IMPRESSION: No active cardiopulmonary disease. Electronically Signed   By: Iven Finn M.D.   On: 05/20/2021 00:05   CT CHEST ABDOMEN PELVIS WO CONTRAST  Result Date: 05/20/2021 CLINICAL DATA:  Cough. Persistent pneumonia. Effusion or abscess suspected. Vomiting diarrhea and chills. EXAM: CT CHEST, ABDOMEN AND PELVIS WITHOUT CONTRAST TECHNIQUE: Multidetector CT imaging of the chest, abdomen and pelvis was performed following the standard protocol without IV  contrast. COMPARISON:  CT angiography chest abdomen pelvis 06/02/2020, CT angio chest 02/13/2017 FINDINGS: CT CHEST FINDINGS Cardiovascular: Normal heart size. No significant pericardial effusion. The thoracic aorta is normal in caliber. Mild atherosclerotic plaque of the  thoracic aorta. Four-vessel coronary artery calcifications. Mediastinum/Nodes: Prominent but nonenlarged paraesophageal lymph node (2:41). No gross hilar adenopathy, noting limited sensitivity for the detection of hilar adenopathy on this noncontrast study. No enlarged mediastinal or axillary lymph nodes. Grossly unremarkable thyroid gland with right thyroid gland slightly heterogeneous with possible punctate calcification. Otherwise the trachea and esophagus demonstrate no significant findings. Small hiatal hernia. Lungs/Pleura: Bilateral lower lobe subsegmental atelectasis. No focal consolidation. Stable centrally calcified nodule at the left posterior base of the left lower lobe measuring 7 mm likely representing a granuloma. Calcified subpleural micronodule also noted within left lower lobe (4:103). Pulmonary micronodule within the left upper lobe (4:35). All of these are stable from prior likely represents sequelae of prior granulomatous disease. No new pulmonary nodule. No pulmonary mass. No pleural effusion. No pneumothorax. Musculoskeletal: No chest wall abnormality. No suspicious lytic or blastic osseous lesions. No acute displaced fracture. Multilevel degenerative changes of the spine. CT ABDOMEN PELVIS FINDINGS Hepatobiliary: Couple Catholic a shins within the right hepatic lobe likely sequelae of prior granulomatous disease. Diffusely atrophic. No focal lesion. Otherwise normal pancreatic contour. No surrounding inflammatory changes. No main pancreatic ductal dilatation. Pancreas: Normal in size without focal abnormality. Spleen: Several punctate calcifications within the splenic parenchyma likely sequelae of prior granulomatous  disease. A splenule is noted (2:53). Normal in size without focal abnormality. Adrenals/Urinary Tract: No adrenal nodule bilaterally. Bilateral renal cortical scarring. Nonspecific trace bilateral perinephric stranding. No nephrolithiasis, no hydronephrosis, and no contour-deforming renal mass. No ureterolithiasis or hydroureter. The urinary bladder is unremarkable. Stomach/Bowel: Stomach is within normal limits. No evidence of bowel wall thickening or dilatation. Few scattered colonic diverticula. The appendix not identified and likely surgically absent. Vascular/Lymphatic: No abdominal aorta or iliac aneurysm. Mild atherosclerotic plaque of the aorta and its branches. Partially calcified right mid abdominal lymph node. No abdominal, pelvic, or inguinal lymphadenopathy. Reproductive: The prostate is prominent in size. Other: No intraperitoneal free fluid. No intraperitoneal free gas. No organized fluid collection. Musculoskeletal: Bilateral small to moderate volume fat containing inguinal hernias. No suspicious lytic or blastic osseous lesions. No acute displaced fracture. Multilevel degenerative changes of the spine. IMPRESSION: 1. No acute intrathoracic, intra-abdominal, intrapelvic abnormality with limited evaluation on this noncontrast study. Other imaging findings of potential clinical significance: 1. Sequela of prior granulomatous disease. 2. Small hiatal hernia. 3. Few scattered colonic diverticula with no acute diverticulitis. 4. Prostatomegaly. 5. Aortic Atherosclerosis (ICD10-I70.0) including four-vessel coronary artery calcifications. 6. Bilateral small to moderate volume fat containing umbilical hernias. Electronically Signed   By: Iven Finn M.D.   On: 05/20/2021 01:09     Assessment/Plan 80 year old male with a history of HTN, OSA, chronic bronchitis and anxiety presenting with fever, chills, cough, vomiting and diarrhea    Sepsis ([place])   Acute gastroenteritis - Patient with vomiting  and diarrhea, fever and tachycardia with elevated lactic acid to 4.0-3.8 though without leukocytosis - CT chest, abdomen and pelvis without contrast unrevealing.  COVID and flu negative, urinalysis negative, C. difficile negative - Patient received IV vancomycin, cefepime and Flagyl in the ER for sepsis of unknown source - Follow GI panel, blood cultures - Continue sepsis fluids - Follow procalcitonin to determine continuation of antibiotics as may be viral    Chronic bronchitis ([facility]) - Not acutely exacerbated - Continue home inhalers with duo nebs as needed    Hypertension - Continue diltiazem and bisoprolol and Dyazide pending med rec  Anxiety - Continue amitriptyline and alprazolam pending med rec   DVT prophylaxis: Lovenox  Code Status: full code  Family Communication:  none  Disposition Plan: Back to previous home environment Consults called: none  Status: Observation    Athena Masse MD [facility]     05/20/2021, 3:53 AM

## 2021-05-21 LAB — CBC WITH DIFFERENTIAL/PLATELET
Abs Immature Granulocytes: 0.02 10*3/uL (ref 0.00–0.07)
Basophils Absolute: 0 10*3/uL (ref 0.0–0.1)
Basophils Relative: 0 %
Eosinophils Absolute: 0.1 10*3/uL (ref 0.0–0.5)
Eosinophils Relative: 2 %
HCT: 35.5 % — ABNORMAL LOW (ref 39.0–52.0)
Hemoglobin: 11.9 g/dL — ABNORMAL LOW (ref 13.0–17.0)
Immature Granulocytes: 0 %
Lymphocytes Relative: 16 %
Lymphs Abs: 0.8 10*3/uL (ref 0.7–4.0)
MCH: 32.1 pg (ref 26.0–34.0)
MCHC: 33.5 g/dL (ref 30.0–36.0)
MCV: 95.7 fL (ref 80.0–100.0)
Monocytes Absolute: 0.6 10*3/uL (ref 0.1–1.0)
Monocytes Relative: 12 %
Neutro Abs: 3.2 10*3/uL (ref 1.7–7.7)
Neutrophils Relative %: 70 %
Platelets: 137 10*3/uL — ABNORMAL LOW (ref 150–400)
RBC: 3.71 MIL/uL — ABNORMAL LOW (ref 4.22–5.81)
RDW: 13.6 % (ref 11.5–15.5)
WBC: 4.6 10*3/uL (ref 4.0–10.5)
nRBC: 0 % (ref 0.0–0.2)

## 2021-05-21 LAB — BASIC METABOLIC PANEL
Anion gap: 9 (ref 5–15)
BUN: 10 mg/dL (ref 8–23)
CO2: 29 mmol/L (ref 22–32)
Calcium: 8.9 mg/dL (ref 8.9–10.3)
Chloride: 99 mmol/L (ref 98–111)
Creatinine, Ser: 0.96 mg/dL (ref 0.61–1.24)
GFR, Estimated: >60 mL/min (ref >60)
Glucose, Bld: 178 mg/dL — ABNORMAL HIGH (ref 70–99)
Potassium: 4.4 mmol/L (ref 3.5–5.1)
Sodium: 137 mmol/L (ref 135–145)

## 2021-05-21 LAB — URINE CULTURE

## 2021-05-21 LAB — MAGNESIUM: Magnesium: 1.3 mg/dL — ABNORMAL LOW (ref 1.7–2.4)

## 2021-05-21 MED ORDER — MAGNESIUM SULFATE 4 GM/100ML IV SOLN
4.0000 g | Freq: Once | INTRAVENOUS | Status: AC
  Administered 2021-05-21: 4 g via INTRAVENOUS
  Filled 2021-05-21: qty 100

## 2021-05-21 NOTE — Discharge Summary (Signed)
Physician Discharge Summary   Brett Blake:[id number] DOB: 07-25-1941 DOA: 05/19/2021  PCP: [name], MD  Admit date: 05/19/2021 Discharge date: 05/21/2021   Admitted From: home Disposition:  home Discharging physician: Dwyane Dee, MD  Recommendations for Outpatient Follow-up:  1. Continue routine outpatient care   Patient discharged to home in Discharge Condition: stable Risk of unplanned readmission score: Unplanned Admission- [place] do not use: 8.91  CODE STATUS: Full Diet recommendation:  Diet Orders (From admission, onward)    Start     Ordered   05/20/21 1846  Diet full liquid Room service appropriate? Yes; Fluid consistency: Thin  Diet effective now       Question Answer Comment  Room service appropriate? Yes   Fluid consistency: Thin      05/20/21 1847          Hospital Course: HABEEB Blake a 80 y.o.malewith PMH HTN, COPD, OSA, anxiety, who presented to the ED with sudden onset chills followed by fever of 101, cough, nausea with dry heaving and diarrhea, all developing over period of 5 to 6 hours hours PTA. Vomiting was mostly clear mucus, nonbloody nonbilious, and stool was loose and runny. He denied affected contacts and ate nothing out of the ordinary aside from some fish from a restaurant. He denied chest pain or shortness of breath. Denied abdominal pain. Denied any bloody stools. Temperature at home was 101. ED course: On arrival, afebrile at 98.6, BP 155/104, pulse 140 with O2 sat 97% on room air. WBC normal but with elevated lactic acid 4.0-3.8. CMP unremarkable, lipase normal, troponin negative at 13, urinalysis unremarkable. COVID and flu negative. C. difficile negative. Imaging: CT chest abdomen pelvis without contrast, no acute intrathoracic, intra-abdominal, intrapelvic abnormality.  Due to concern for sepsis, he was initiated on vancomycin, cefepime, Flagyl and started on fluids.  GI pathogen panel later returned positive for  Campylobacter and STEC (Shiga like toxin producing E. Coli).   Severe sepsis Gastroenteritis  - subjective fever prior to admission of 101, tachycardia, lactic acidosis; GI source - GIP shows Campylobacter and STEC (Shiga like toxin producing E. Coli). This is O157 negative and no further way to test for delineation between toxin 1/2.  ID consulted due to this.  Antibiotics are not recommended and patient recommended to continue supportive treatment.  His symptoms are also slowly improving -He had ongoing further improvement with supportive care and IV fluids.  No development of abdominal pain or rectal bleeding -Patient discharged home in stable condition with no further antibiotics after ID consult.  He was instructed to keep a look out for development of bloody stools or development of abdominal pain  Chronic bronchitis ([place]) -Not acutely exacerbated -Continue home inhalers with duo nebs as needed  Hypertension -  Home meds resumed at discharge  Anxiety -Continue home meds   The patient's chronic medical conditions were treated accordingly per the patient's home medication regimen except as noted.  On day of discharge, patient was felt deemed stable for discharge. Patient/family member advised to call PCP or come back to ER if needed.   Principal Diagnosis: Sepsis [name])  Discharge Diagnoses: Active Hospital Problems   Diagnosis Date Noted  . Sepsis ([name]) 05/20/2021  . OSA (obstructive sleep apnea) 05/20/2021  . Acute gastroenteritis 05/20/2021  . Hypertension 06/29/2016  . Chronic bronchitis ([name]) 12/14/2011    Resolved Hospital Problems  No resolved problems to display.    Discharge Instructions    Increase activity slowly   Complete  by: As directed      Allergies as of 05/21/2021      Reactions   Ace Inhibitors Cough   Metoprolol Diarrhea   Sulfa Antibiotics Rash      Medication List    STOP taking these medications   diltiazem 180 MG 24 hr  capsule Commonly known as: TIAZAC   famotidine 40 MG tablet Commonly known as: PEPCID   sertraline 25 MG tablet Commonly known as: ZOLOFT     TAKE these medications   albuterol 1.25 MG/3ML nebulizer solution Commonly known as: ACCUNEB TAKE 3 MLS (1.25 MG TOTAL) BY NEBULIZATION EVERY 6 (SIX) HOURS AS NEEDED FOR WHEEZING   ALPRAZolam 0.5 MG tablet Commonly known as: XANAX Take 0.5 tablets by mouth daily as needed.   amitriptyline 10 MG tablet Commonly known as: ELAVIL Take 10 mg by mouth at bedtime.   aspirin 81 MG chewable tablet Chew 81 mg by mouth daily.   bisoprolol 5 MG tablet Commonly known as: ZEBETA Take 2.5 mg by mouth 2 (two) times daily.   diltiazem 180 MG 24 hr capsule Commonly known as: CARDIZEM CD Take 1 capsule by mouth 2 (two) times a day.   donepezil 5 MG tablet Commonly known as: ARICEPT Take 5 mg by mouth at bedtime.   fluticasone 50 MCG/ACT nasal spray Commonly known as: FLONASE Place 1 spray into both nostrils daily as needed for allergies or rhinitis.   glucosamine-chondroitin 500-400 MG tablet Take 1 tablet by mouth daily.   ibuprofen 200 MG tablet Commonly known as: ADVIL Take 200 mg by mouth every 6 (six) hours as needed.   isosorbide mononitrate 30 MG 24 hr tablet Commonly known as: IMDUR Take 30 mg by mouth daily.   montelukast 10 MG tablet Commonly known as: SINGULAIR Take 10 mg by mouth at bedtime.   MULTIVITAMIN PO Take 1 tablet by mouth daily.   nitroGLYCERIN 0.4 MG SL tablet Commonly known as: NITROSTAT Place 0.4 mg under the tongue every 5 (five) minutes as needed for chest pain.   omeprazole 40 MG capsule Commonly known as: PRILOSEC Take 40 mg by mouth daily.   primidone 50 MG tablet Commonly known as: MYSOLINE Take 50 mg by mouth daily.   simvastatin 20 MG tablet Commonly known as: ZOCOR Take 20 mg by mouth daily.   Symbicort 80-4.5 MCG/ACT inhaler Generic drug: budesonide-formoterol Inhale 2 puffs into the  lungs 2 (two) times daily as needed.   triamterene-hydrochlorothiazide 37.5-25 MG capsule Commonly known as: DYAZIDE Take 1 capsule by mouth daily.       Allergies  Allergen Reactions  . Ace Inhibitors Cough  . Metoprolol Diarrhea  . Sulfa Antibiotics Rash    Consultations: ID  Discharge Exam: BP (!) 152/103 (BP Location: Right Arm)   Pulse 83   Temp 97.9 F (36.6 C) (Oral)   Resp 16   Ht 5\' 5"  (1.651 m)   Wt 77.1 kg   SpO2 97%   BMI 28.29 kg/m  General appearance: alert, cooperative and no distress Head: Normocephalic, without obvious abnormality, atraumatic Eyes: EOMI Lungs: clear to auscultation bilaterally Heart: regular rate and rhythm and S1, S2 normal Abdomen: Obese, soft, no true tenderness to palpation.  Bowel sounds present Extremities: No edema Skin: mobility and turgor normal Neurologic: Grossly normal  The results of significant diagnostics from this hospitalization (including imaging, microbiology, ancillary and laboratory) are listed below for reference.   Microbiology: Recent Results (from the past 240 hour(s))  Culture, blood (routine x 2)  Status: None (Preliminary result)   Collection Time: 05/20/21 12:29 AM   Specimen: BLOOD  Result Value Ref Range Status   Specimen Description BLOOD RIGHT ANTECUBITAL  Final   Special Requests   Final    BOTTLES DRAWN AEROBIC AND ANAEROBIC Blood Culture adequate volume   Culture   Final    NO GROWTH 1 DAY Performed at [facility], [address], [place], [place] [address]    Report Status PENDING  Incomplete  Culture, blood (routine x 2)     Status: None (Preliminary result)   Collection Time: 05/20/21 12:29 AM   Specimen: BLOOD  Result Value Ref Range Status   Specimen Description BLOOD LEFT ANTECUBITAL  Final   Special Requests   Final    BOTTLES DRAWN AEROBIC AND ANAEROBIC Blood Culture adequate volume   Culture   Final    NO GROWTH 1 DAY Performed at [facility], [address], [place], [place] [address]    Report Status PENDING  Incomplete  Resp Panel by RT-PCR (Flu A&B, Covid) Nasopharyngeal Swab     Status: None   Collection Time: 05/20/21 12:29 AM   Specimen: Nasopharyngeal Swab; Nasopharyngeal(NP) swabs in vial transport medium  Result Value Ref Range Status   SARS Coronavirus 2 by RT PCR NEGATIVE NEGATIVE Final    Comment: (NOTE) SARS-CoV-2 target nucleic acids are NOT DETECTED.  The SARS-CoV-2 RNA is generally detectable in upper respiratory specimens during the acute phase of infection. The lowest concentration of SARS-CoV-2 viral copies this assay can detect is 138 copies/mL. A negative result does not preclude SARS-Cov-2 infection and should not be used as the sole basis for treatment or other patient management decisions. A negative result may occur with  improper specimen collection/handling, submission of specimen other than nasopharyngeal swab, presence of viral mutation(s) within the areas targeted by this assay, and inadequate number of viral copies(<138 copies/mL). A negative result must be combined with clinical observations, patient history, and epidemiological information. The expected result is Negative.  Fact Sheet for Patients:  EntrepreneurPulse.com.au  Fact Sheet for Healthcare Providers:  IncredibleEmployment.be  This test is no t yet approved or cleared by the [place] FDA and  has been authorized for detection and/or diagnosis of SARS-CoV-2 by FDA under an Emergency Use Authorization (EUA). This EUA will remain  in effect (meaning this test can be used) for the duration of the COVID-19 declaration under Section 564(b)(1) of the Act, 21 U.S.C.section 360bbb-3(b)(1), unless the authorization is terminated  or revoked sooner.       Influenza A by PCR NEGATIVE NEGATIVE Final   Influenza B by PCR NEGATIVE NEGATIVE Final    Comment: (NOTE) The Xpert Xpress SARS-CoV-2/FLU/RSV  plus assay is intended as an aid in the diagnosis of influenza from Nasopharyngeal swab specimens and should not be used as a sole basis for treatment. Nasal washings and aspirates are unacceptable for Xpert Xpress SARS-CoV-2/FLU/RSV testing.  Fact Sheet for Patients: EntrepreneurPulse.com.au  Fact Sheet for Healthcare Providers: IncredibleEmployment.be  This test is not yet approved or cleared by the [place] FDA and has been authorized for detection and/or diagnosis of SARS-CoV-2 by FDA under an Emergency Use Authorization (EUA). This EUA will remain in effect (meaning this test can be used) for the duration of the COVID-19 declaration under Section 564(b)(1) of the Act, 21 U.S.C. section 360bbb-3(b)(1), unless the authorization is terminated or revoked.  Performed at [facility], [address], [place], [place] [address]  C Difficile Quick Screen w PCR reflex     Status: None   Collection Time: 05/20/21 12:29 AM   Specimen: Stool  Result Value Ref Range Status   C Diff antigen NEGATIVE NEGATIVE Final   C Diff toxin NEGATIVE NEGATIVE Final   C Diff interpretation No C. difficile detected.  Final    Comment: Performed at [facility], [address] [place], [place] [address]  Gastrointestinal Panel by PCR , Stool     Status: Abnormal   Collection Time: 05/20/21 12:29 AM   Specimen: Stool  Result Value Ref Range Status   Campylobacter species DETECTED (A) NOT DETECTED Final    Comment: RESULT CALLED TO, READ BACK BY AND VERIFIED WITH: GRACIE WINEMAN @0356  ON 05/20/21 SKL    Plesimonas shigelloides NOT DETECTED NOT DETECTED Final   Salmonella species NOT DETECTED NOT DETECTED Final   Yersinia enterocolitica NOT DETECTED NOT DETECTED Final   Vibrio species NOT DETECTED NOT DETECTED Final   Vibrio cholerae NOT DETECTED NOT DETECTED Final   Enteroaggregative E coli (EAEC) NOT DETECTED NOT DETECTED Final    Enterotoxigenic E coli (ETEC) NOT DETECTED NOT DETECTED Final   Shiga like toxin producing E coli (STEC) DETECTED (A) NOT DETECTED Final    Comment: RESULT CALLED TO, READ BACK BY AND VERIFIED WITH: GRACIE WINEMAN @0356  ON 05/20/21 SKL    E. coli O157 NOT DETECTED NOT DETECTED Final   Shigella/Enteroinvasive E coli (EIEC) NOT DETECTED NOT DETECTED Final   Cryptosporidium NOT DETECTED NOT DETECTED Final   Cyclospora cayetanensis NOT DETECTED NOT DETECTED Final   Entamoeba histolytica NOT DETECTED NOT DETECTED Final   Giardia lamblia NOT DETECTED NOT DETECTED Final   Adenovirus F40/41 NOT DETECTED NOT DETECTED Final   Astrovirus NOT DETECTED NOT DETECTED Final   Norovirus GI/GII NOT DETECTED NOT DETECTED Final   Rotavirus A NOT DETECTED NOT DETECTED Final   Sapovirus (I, II, IV, and V) NOT DETECTED NOT DETECTED Final    Comment: Performed at [facility], [address] [place], [place] [address]  Urine culture     Status: Abnormal   Collection Time: 05/20/21 12:29 AM   Specimen: Urine, Random  Result Value Ref Range Status   Specimen Description   Final    URINE, RANDOM Performed at [facility], [address], [place], [place] [address]    Special Requests   Final    NONE Performed at [facility], [address] [place], [place] [address]    Culture MULTIPLE SPECIES PRESENT, SUGGEST RECOLLECTION (A)  Final   Report Status 05/21/2021 FINAL  Final     Labs: BNP (last 3 results) No results for input(s): BNP in the last 8760 hours. Basic Metabolic Panel: Recent Labs  Lab 05/19/21 2354 05/21/21 0608  NA 137 137  K 3.3* 4.4  CL 100 99  CO2 22 29  GLUCOSE 153* 178*  BUN 26* 10  CREATININE 1.12 0.96  CALCIUM 9.2 8.9  MG  --  1.3*   Liver Function Tests: Recent Labs  Lab 05/19/21 2354  AST 35  ALT 28  ALKPHOS 71  BILITOT 0.8  PROT 7.6  ALBUMIN 4.0   Recent Labs  Lab 05/19/21 2354  LIPASE 44   No results for input(s): AMMONIA  in the last 168 hours. CBC: Recent Labs  Lab 05/19/21 2354 05/21/21 0608  WBC 8.0 4.6  NEUTROABS 6.5 3.2  HGB 13.4 11.9*  HCT 39.6 35.5*  MCV 95.0 95.7  PLT 175 137*   Cardiac Enzymes: No results for input(s): CKTOTAL, CKMB, CKMBINDEX, TROPONINI in the last 168 hours. BNP: Invalid input(s): POCBNP CBG: No results for input(s): GLUCAP in the last 168 hours. D-Dimer No results for input(s): DDIMER in the last 72 hours. Hgb A1c No results for input(s): HGBA1C in the last 72 hours. Lipid Profile No results for input(s): CHOL, HDL, LDLCALC, TRIG, CHOLHDL, LDLDIRECT in the last 72 hours. Thyroid function studies No results for input(s): TSH, T4TOTAL, T3FREE, THYROIDAB in the last 72 hours.  Invalid input(s): FREET3 Anemia work up No results for input(s): VITAMINB12, FOLATE, FERRITIN, TIBC, IRON, RETICCTPCT in the last 72 hours. Urinalysis    Component Value Date/Time   COLORURINE YELLOW (A) 05/20/2021 0029   APPEARANCEUR CLEAR (A) 05/20/2021 0029   LABSPEC 1.016 05/20/2021 0029   PHURINE 5.0 05/20/2021 0029   GLUCOSEU NEGATIVE 05/20/2021 0029   HGBUR LARGE (A) 05/20/2021 0029   BILIRUBINUR NEGATIVE 05/20/2021 0029   KETONESUR NEGATIVE 05/20/2021 0029   PROTEINUR NEGATIVE 05/20/2021 0029   NITRITE NEGATIVE 05/20/2021 0029   LEUKOCYTESUR NEGATIVE 05/20/2021 0029   Sepsis Labs Invalid input(s): PROCALCITONIN,  WBC,  LACTICIDVEN Microbiology Recent Results (from the past 240 hour(s))  Culture, blood (routine x 2)     Status: None (Preliminary result)   Collection Time: 05/20/21 12:29 AM   Specimen: BLOOD  Result Value Ref Range Status   Specimen Description BLOOD RIGHT ANTECUBITAL  Final   Special Requests   Final    BOTTLES DRAWN AEROBIC AND ANAEROBIC Blood Culture adequate volume   Culture   Final    NO GROWTH 1 DAY Performed at [facility], [address], [place], [place] [address]    Report Status PENDING  Incomplete  Culture, blood (routine x 2)      Status: None (Preliminary result)   Collection Time: 05/20/21 12:29 AM   Specimen: BLOOD  Result Value Ref Range Status   Specimen Description BLOOD LEFT ANTECUBITAL  Final   Special Requests   Final    BOTTLES DRAWN AEROBIC AND ANAEROBIC Blood Culture adequate volume   Culture   Final    NO GROWTH 1 DAY Performed at [facility], [address], [place], [place] [address]    Report Status PENDING  Incomplete  Resp Panel by RT-PCR (Flu A&B, Covid) Nasopharyngeal Swab     Status: None   Collection Time: 05/20/21 12:29 AM   Specimen: Nasopharyngeal Swab; Nasopharyngeal(NP) swabs in vial transport medium  Result Value Ref Range Status   SARS Coronavirus 2 by RT PCR NEGATIVE NEGATIVE Final    Comment: (NOTE) SARS-CoV-2 target nucleic acids are NOT DETECTED.  The SARS-CoV-2 RNA is generally detectable in upper respiratory specimens during the acute phase of infection. The lowest concentration of SARS-CoV-2 viral copies this assay can detect is 138 copies/mL. A negative result does not preclude SARS-Cov-2 infection and should not be used as the sole basis for treatment or other patient management decisions. A negative result may occur with  improper specimen collection/handling, submission of specimen other than nasopharyngeal swab, presence of viral mutation(s) within the areas targeted by this assay, and inadequate number of viral copies(<138 copies/mL). A negative result must be combined with clinical observations, patient history, and epidemiological information. The expected result is Negative.  Fact Sheet for Patients:  EntrepreneurPulse.com.au  Fact Sheet for Healthcare Providers:  IncredibleEmployment.be  This test is no t yet approved or cleared by the [place] and  has been authorized  for detection and/or diagnosis of SARS-CoV-2 by FDA under an Emergency Use Authorization (EUA). This EUA will remain  in effect  (meaning this test can be used) for the duration of the COVID-19 declaration under Section 564(b)(1) of the Act, 21 U.S.C.section 360bbb-3(b)(1), unless the authorization is terminated  or revoked sooner.       Influenza A by PCR NEGATIVE NEGATIVE Final   Influenza B by PCR NEGATIVE NEGATIVE Final    Comment: (NOTE) The Xpert Xpress SARS-CoV-2/FLU/RSV plus assay is intended as an aid in the diagnosis of influenza from Nasopharyngeal swab specimens and should not be used as a sole basis for treatment. Nasal washings and aspirates are unacceptable for Xpert Xpress SARS-CoV-2/FLU/RSV testing.  Fact Sheet for Patients: EntrepreneurPulse.com.au  Fact Sheet for Healthcare Providers: IncredibleEmployment.be  This test is not yet approved or cleared by the [place] FDA and has been authorized for detection and/or diagnosis of SARS-CoV-2 by FDA under an Emergency Use Authorization (EUA). This EUA will remain in effect (meaning this test can be used) for the duration of the COVID-19 declaration under Section 564(b)(1) of the Act, 21 U.S.C. section 360bbb-3(b)(1), unless the authorization is terminated or revoked.  Performed at [facility], [place], [place] [address]   C Difficile Quick Screen w PCR reflex     Status: None   Collection Time: 05/20/21 12:29 AM   Specimen: Stool  Result Value Ref Range Status   C Diff antigen NEGATIVE NEGATIVE Final   C Diff toxin NEGATIVE NEGATIVE Final   C Diff interpretation No C. difficile detected.  Final    Comment: Performed at [facility], [address] [place], [place] [address]  Gastrointestinal Panel by PCR , Stool     Status: Abnormal   Collection Time: 05/20/21 12:29 AM   Specimen: Stool  Result Value Ref Range Status   Campylobacter species DETECTED (A) NOT DETECTED Final    Comment: RESULT CALLED TO, READ BACK BY AND VERIFIED WITH: GRACIE WINEMAN @0356  ON  05/20/21 SKL    Plesimonas shigelloides NOT DETECTED NOT DETECTED Final   Salmonella species NOT DETECTED NOT DETECTED Final   Yersinia enterocolitica NOT DETECTED NOT DETECTED Final   Vibrio species NOT DETECTED NOT DETECTED Final   Vibrio cholerae NOT DETECTED NOT DETECTED Final   Enteroaggregative E coli (EAEC) NOT DETECTED NOT DETECTED Final   Enterotoxigenic E coli (ETEC) NOT DETECTED NOT DETECTED Final   Shiga like toxin producing E coli (STEC) DETECTED (A) NOT DETECTED Final    Comment: RESULT CALLED TO, READ BACK BY AND VERIFIED WITH: GRACIE WINEMAN @0356  ON 05/20/21 SKL    E. coli O157 NOT DETECTED NOT DETECTED Final   Shigella/Enteroinvasive E coli (EIEC) NOT DETECTED NOT DETECTED Final   Cryptosporidium NOT DETECTED NOT DETECTED Final   Cyclospora cayetanensis NOT DETECTED NOT DETECTED Final   Entamoeba histolytica NOT DETECTED NOT DETECTED Final   Giardia lamblia NOT DETECTED NOT DETECTED Final   Adenovirus F40/41 NOT DETECTED NOT DETECTED Final   Astrovirus NOT DETECTED NOT DETECTED Final   Norovirus GI/GII NOT DETECTED NOT DETECTED Final   Rotavirus A NOT DETECTED NOT DETECTED Final   Sapovirus (I, II, IV, and V) NOT DETECTED NOT DETECTED Final    Comment: Performed at [facility], [address], [place], [place] [address]  Urine culture     Status: Abnormal   Collection Time: 05/20/21 12:29 AM   Specimen: Urine, Random  Result Value Ref Range Status  Specimen Description   Final    URINE, RANDOM Performed at [facility], [address] [place], [place] [address]    Special Requests   Final    NONE Performed at [facility], [address] [place], [place] [address]    Culture MULTIPLE SPECIES PRESENT, SUGGEST RECOLLECTION (A)  Final   Report Status 05/21/2021 FINAL  Final    Procedures/Studies: DG Chest 2 View  Result Date: 05/20/2021 CLINICAL DATA:  Chest pain.  Diarrhea.  Chills. EXAM: CHEST - 2 VIEW COMPARISON:  Chest  x-ray 07/16/2020, CT chest 06/02/2020 FINDINGS: The heart size and mediastinal contours are within normal limits. Redemonstration of left base calcified nodule. No focal consolidation. No pulmonary edema. No pleural effusion. No pneumothorax. No acute osseous abnormality. Multilevel degenerative changes of the thoracic spine. Right upper quadrant surgical clips. IMPRESSION: No active cardiopulmonary disease. Electronically Signed   By: Iven Finn M.D.   On: 05/20/2021 00:05   CT CHEST ABDOMEN PELVIS WO CONTRAST  Result Date: 05/20/2021 CLINICAL DATA:  Cough. Persistent pneumonia. Effusion or abscess suspected. Vomiting diarrhea and chills. EXAM: CT CHEST, ABDOMEN AND PELVIS WITHOUT CONTRAST TECHNIQUE: Multidetector CT imaging of the chest, abdomen and pelvis was performed following the standard protocol without IV contrast. COMPARISON:  CT angiography chest abdomen pelvis 06/02/2020, CT angio chest 02/13/2017 FINDINGS: CT CHEST FINDINGS Cardiovascular: Normal heart size. No significant pericardial effusion. The thoracic aorta is normal in caliber. Mild atherosclerotic plaque of the thoracic aorta. Four-vessel coronary artery calcifications. Mediastinum/Nodes: Prominent but nonenlarged paraesophageal lymph node (2:41). No gross hilar adenopathy, noting limited sensitivity for the detection of hilar adenopathy on this noncontrast study. No enlarged mediastinal or axillary lymph nodes. Grossly unremarkable thyroid gland with right thyroid gland slightly heterogeneous with possible punctate calcification. Otherwise the trachea and esophagus demonstrate no significant findings. Small hiatal hernia. Lungs/Pleura: Bilateral lower lobe subsegmental atelectasis. No focal consolidation. Stable centrally calcified nodule at the left posterior base of the left lower lobe measuring 7 mm likely representing a granuloma. Calcified subpleural micronodule also noted within left lower lobe (4:103). Pulmonary micronodule  within the left upper lobe (4:35). All of these are stable from prior likely represents sequelae of prior granulomatous disease. No new pulmonary nodule. No pulmonary mass. No pleural effusion. No pneumothorax. Musculoskeletal: No chest wall abnormality. No suspicious lytic or blastic osseous lesions. No acute displaced fracture. Multilevel degenerative changes of the spine. CT ABDOMEN PELVIS FINDINGS Hepatobiliary: Couple Catholic a shins within the right hepatic lobe likely sequelae of prior granulomatous disease. Diffusely atrophic. No focal lesion. Otherwise normal pancreatic contour. No surrounding inflammatory changes. No main pancreatic ductal dilatation. Pancreas: Normal in size without focal abnormality. Spleen: Several punctate calcifications within the splenic parenchyma likely sequelae of prior granulomatous disease. A splenule is noted (2:53). Normal in size without focal abnormality. Adrenals/Urinary Tract: No adrenal nodule bilaterally. Bilateral renal cortical scarring. Nonspecific trace bilateral perinephric stranding. No nephrolithiasis, no hydronephrosis, and no contour-deforming renal mass. No ureterolithiasis or hydroureter. The urinary bladder is unremarkable. Stomach/Bowel: Stomach is within normal limits. No evidence of bowel wall thickening or dilatation. Few scattered colonic diverticula. The appendix not identified and likely surgically absent. Vascular/Lymphatic: No abdominal aorta or iliac aneurysm. Mild atherosclerotic plaque of the aorta and its branches. Partially calcified right mid abdominal lymph node. No abdominal, pelvic, or inguinal lymphadenopathy. Reproductive: The prostate is prominent in size. Other: No intraperitoneal free fluid. No intraperitoneal free gas. No organized fluid collection. Musculoskeletal: Bilateral small to moderate volume fat containing  inguinal hernias. No suspicious lytic or blastic osseous lesions. No acute displaced fracture. Multilevel degenerative  changes of the spine. IMPRESSION: 1. No acute intrathoracic, intra-abdominal, intrapelvic abnormality with limited evaluation on this noncontrast study. Other imaging findings of potential clinical significance: 1. Sequela of prior granulomatous disease. 2. Small hiatal hernia. 3. Few scattered colonic diverticula with no acute diverticulitis. 4. Prostatomegaly. 5. Aortic Atherosclerosis (ICD10-I70.0) including four-vessel coronary artery calcifications. 6. Bilateral small to moderate volume fat containing umbilical hernias. Electronically Signed   By: Iven Finn M.D.   On: 05/20/2021 01:09     Time coordinating discharge: Over 30 minutes    Dwyane Dee, MD  [facility] 05/21/2021, 5:10 PM

## 2021-05-25 LAB — CULTURE, BLOOD (ROUTINE X 2)
Culture: NO GROWTH
Culture: NO GROWTH
Special Requests: ADEQUATE
Special Requests: ADEQUATE

## 2021-08-18 ENCOUNTER — Other Ambulatory Visit: Payer: Self-pay | Admitting: Family Medicine

## 2021-08-18 ENCOUNTER — Ambulatory Visit
Admission: RE | Admit: 2021-08-18 | Discharge: 2021-08-18 | Disposition: A | Discharge status: Home / Self Care | Payer: Medicare Other | Source: Ambulatory Visit | Attending: Family Medicine | Admitting: Family Medicine

## 2021-08-18 ENCOUNTER — Other Ambulatory Visit: Payer: Self-pay

## 2021-08-18 DIAGNOSIS — Y92009 Unspecified place in unspecified non-institutional (private) residence as the place of occurrence of the external cause: Secondary | ICD-10-CM | POA: Insufficient documentation

## 2021-08-18 DIAGNOSIS — R413 Other amnesia: Secondary | ICD-10-CM

## 2021-08-18 DIAGNOSIS — S0990XA Unspecified injury of head, initial encounter: Secondary | ICD-10-CM | POA: Diagnosis present

## 2021-08-18 DIAGNOSIS — W19XXXA Unspecified fall, initial encounter: Secondary | ICD-10-CM | POA: Diagnosis not present

## 2021-08-18 DIAGNOSIS — I6782 Cerebral ischemia: Secondary | ICD-10-CM | POA: Diagnosis not present

## 2021-08-18 DIAGNOSIS — R112 Nausea with vomiting, unspecified: Secondary | ICD-10-CM | POA: Insufficient documentation

## 2021-10-15 ENCOUNTER — Encounter: Payer: Self-pay | Admitting: General Surgery

## 2021-11-17 DIAGNOSIS — F02A0 Dementia in other diseases classified elsewhere, mild, without behavioral disturbance, psychotic disturbance, mood disturbance, and anxiety: Secondary | ICD-10-CM | POA: Diagnosis present

## 2021-11-17 DIAGNOSIS — G309 Alzheimer's disease, unspecified: Secondary | ICD-10-CM | POA: Diagnosis present

## 2021-12-10 ENCOUNTER — Other Ambulatory Visit: Payer: Self-pay

## 2021-12-10 ENCOUNTER — Emergency Department: Payer: Medicare Other

## 2021-12-10 DIAGNOSIS — M1990 Unspecified osteoarthritis, unspecified site: Secondary | ICD-10-CM | POA: Diagnosis present

## 2021-12-10 DIAGNOSIS — I10 Essential (primary) hypertension: Secondary | ICD-10-CM | POA: Diagnosis present

## 2021-12-10 DIAGNOSIS — Z7982 Long term (current) use of aspirin: Secondary | ICD-10-CM

## 2021-12-10 DIAGNOSIS — R079 Chest pain, unspecified: Secondary | ICD-10-CM | POA: Diagnosis not present

## 2021-12-10 DIAGNOSIS — D649 Anemia, unspecified: Secondary | ICD-10-CM | POA: Diagnosis present

## 2021-12-10 DIAGNOSIS — F419 Anxiety disorder, unspecified: Secondary | ICD-10-CM | POA: Diagnosis present

## 2021-12-10 DIAGNOSIS — I25110 Atherosclerotic heart disease of native coronary artery with unstable angina pectoris: Principal | ICD-10-CM | POA: Diagnosis present

## 2021-12-10 DIAGNOSIS — Z86010 Personal history of colonic polyps: Secondary | ICD-10-CM

## 2021-12-10 DIAGNOSIS — E7800 Pure hypercholesterolemia, unspecified: Secondary | ICD-10-CM | POA: Diagnosis present

## 2021-12-10 DIAGNOSIS — Z806 Family history of leukemia: Secondary | ICD-10-CM

## 2021-12-10 DIAGNOSIS — G4733 Obstructive sleep apnea (adult) (pediatric): Secondary | ICD-10-CM | POA: Diagnosis present

## 2021-12-10 DIAGNOSIS — Z8249 Family history of ischemic heart disease and other diseases of the circulatory system: Secondary | ICD-10-CM

## 2021-12-10 DIAGNOSIS — J449 Chronic obstructive pulmonary disease, unspecified: Secondary | ICD-10-CM | POA: Diagnosis present

## 2021-12-10 DIAGNOSIS — K219 Gastro-esophageal reflux disease without esophagitis: Secondary | ICD-10-CM | POA: Diagnosis present

## 2021-12-10 DIAGNOSIS — Z79899 Other long term (current) drug therapy: Secondary | ICD-10-CM

## 2021-12-10 DIAGNOSIS — Z87891 Personal history of nicotine dependence: Secondary | ICD-10-CM

## 2021-12-10 DIAGNOSIS — F32A Depression, unspecified: Secondary | ICD-10-CM | POA: Diagnosis present

## 2021-12-10 DIAGNOSIS — Z882 Allergy status to sulfonamides status: Secondary | ICD-10-CM

## 2021-12-10 DIAGNOSIS — Z8042 Family history of malignant neoplasm of prostate: Secondary | ICD-10-CM

## 2021-12-10 DIAGNOSIS — E119 Type 2 diabetes mellitus without complications: Secondary | ICD-10-CM | POA: Diagnosis present

## 2021-12-10 DIAGNOSIS — Z20822 Contact with and (suspected) exposure to covid-19: Secondary | ICD-10-CM | POA: Diagnosis present

## 2021-12-10 DIAGNOSIS — Z888 Allergy status to other drugs, medicaments and biological substances status: Secondary | ICD-10-CM

## 2021-12-10 DIAGNOSIS — E876 Hypokalemia: Secondary | ICD-10-CM | POA: Diagnosis present

## 2021-12-10 LAB — COMPREHENSIVE METABOLIC PANEL
ALT: 30 U/L (ref 0–44)
AST: 38 U/L (ref 15–41)
Albumin: 3.5 g/dL (ref 3.5–5.0)
Alkaline Phosphatase: 92 U/L (ref 38–126)
Anion gap: 10 (ref 5–15)
BUN: 10 mg/dL (ref 8–23)
CO2: 29 mmol/L (ref 22–32)
Calcium: 8.7 mg/dL — ABNORMAL LOW (ref 8.9–10.3)
Chloride: 97 mmol/L — ABNORMAL LOW (ref 98–111)
Creatinine, Ser: 0.88 mg/dL (ref 0.61–1.24)
GFR, Estimated: >60 mL/min (ref >60)
Glucose, Bld: 117 mg/dL — ABNORMAL HIGH (ref 70–99)
Potassium: 3.1 mmol/L — ABNORMAL LOW (ref 3.5–5.1)
Sodium: 136 mmol/L (ref 135–145)
Total Bilirubin: 0.6 mg/dL (ref 0.3–1.2)
Total Protein: 7.2 g/dL (ref 6.5–8.1)

## 2021-12-10 LAB — TROPONIN I (HIGH SENSITIVITY)
Troponin I (High Sensitivity): 12 ng/L (ref <18)
Troponin I (High Sensitivity): 13 ng/L (ref <18)

## 2021-12-10 LAB — CBC WITH DIFFERENTIAL/PLATELET
Abs Immature Granulocytes: 0.03 10*3/uL (ref 0.00–0.07)
Basophils Absolute: 0 10*3/uL (ref 0.0–0.1)
Basophils Relative: 1 %
Eosinophils Absolute: 0.1 10*3/uL (ref 0.0–0.5)
Eosinophils Relative: 2 %
HCT: 35.7 % — ABNORMAL LOW (ref 39.0–52.0)
Hemoglobin: 12.4 g/dL — ABNORMAL LOW (ref 13.0–17.0)
Immature Granulocytes: 1 %
Lymphocytes Relative: 21 %
Lymphs Abs: 0.8 10*3/uL (ref 0.7–4.0)
MCH: 33.5 pg (ref 26.0–34.0)
MCHC: 34.7 g/dL (ref 30.0–36.0)
MCV: 96.5 fL (ref 80.0–100.0)
Monocytes Absolute: 0.6 10*3/uL (ref 0.1–1.0)
Monocytes Relative: 16 %
Neutro Abs: 2.3 10*3/uL (ref 1.7–7.7)
Neutrophils Relative %: 59 %
Platelets: 202 10*3/uL (ref 150–400)
RBC: 3.7 MIL/uL — ABNORMAL LOW (ref 4.22–5.81)
RDW: 14.6 % (ref 11.5–15.5)
WBC: 3.9 10*3/uL — ABNORMAL LOW (ref 4.0–10.5)
nRBC: 0 % (ref 0.0–0.2)

## 2021-12-10 NOTE — ED Triage Notes (Signed)
First nurse note: tonight, pt was cooking with his wife and went to the bathroom and began having some chest pain with associated dizziness and SOB.  Pt given ASA, nitro and xanax by wife.  HR 120, 96% RA, 187/108, CBG 137.  20G IV LW.

## 2021-12-10 NOTE — ED Triage Notes (Addendum)
Pt BIB EMS after having an episode of dizziness and SOB. Per pt, he could not get his breath. Pt took aspirin, nitro, and xanax before EMS arrived. Pt endorse chest pain and sometimes diaphoresis.

## 2021-12-10 NOTE — ED Provider Notes (Signed)
°  Emergency Medicine Provider Triage Evaluation Note  Brett Blake , a 80 y.o.male,  was evaluated in triage.  Pt complains of chest pain.  Patient states that he experienced a sudden onset of chest pain, shortness of breath, and dizziness approximately few hours ago.  Patient took aspirin, nitro, and Xanax prior to EMS arrival.  Denies fever/chills, abdominal pain, back pain, or urinary symptoms.   Review of Systems  Positive: Chest pain, shortness of breath. Negative: Denies fever, chest pain, vomiting  Physical Exam   Vitals:   12/10/21 1910  BP: (!) 156/92  Pulse: (!) 132  Resp: 20  Temp: 98 F (36.7 C)  SpO2: 96%   Gen:   Awake, uncomfortable. Resp:  Normal effort  MSK:   Moves extremities without difficulty  Other:    Medical Decision Making  Given the patient's initial medical screening exam, the following diagnostic evaluation has been ordered. The patient will be placed in the appropriate treatment space, once one is available, to complete the evaluation and treatment. I have discussed the plan of care with the patient and I have advised the patient that an ED physician or mid-level practitioner will reevaluate their condition after the test results have been received, as the results may give them additional insight into the type of treatment they may need.    Diagnostics: Labs, EKG, CXR.  Treatments: none immediately   Teodoro Spray, [place] 12/10/21 1917    Vanessa Leakey, MD 12/11/21 1104

## 2021-12-11 ENCOUNTER — Inpatient Hospital Stay
Admission: EM | Admit: 2021-12-11 | Discharge: 2021-12-14 | DRG: 287 | Disposition: A | Discharge status: Home / Self Care | Payer: Medicare Other | Attending: Internal Medicine | Admitting: Internal Medicine

## 2021-12-11 ENCOUNTER — Emergency Department: Payer: Medicare Other

## 2021-12-11 ENCOUNTER — Encounter: Payer: Self-pay | Admitting: Family Medicine

## 2021-12-11 ENCOUNTER — Other Ambulatory Visit: Payer: Self-pay

## 2021-12-11 DIAGNOSIS — E785 Hyperlipidemia, unspecified: Secondary | ICD-10-CM | POA: Diagnosis not present

## 2021-12-11 DIAGNOSIS — R000 Tachycardia, unspecified: Secondary | ICD-10-CM

## 2021-12-11 DIAGNOSIS — R002 Palpitations: Secondary | ICD-10-CM

## 2021-12-11 DIAGNOSIS — E119 Type 2 diabetes mellitus without complications: Secondary | ICD-10-CM | POA: Diagnosis not present

## 2021-12-11 DIAGNOSIS — I200 Unstable angina: Secondary | ICD-10-CM | POA: Diagnosis present

## 2021-12-11 DIAGNOSIS — F32A Depression, unspecified: Secondary | ICD-10-CM

## 2021-12-11 LAB — APTT: aPTT: 26 seconds (ref 24–36)

## 2021-12-11 LAB — RESP PANEL BY RT-PCR (FLU A&B, COVID) ARPGX2
Influenza A by PCR: NEGATIVE
Influenza B by PCR: NEGATIVE
SARS Coronavirus 2 by RT PCR: NEGATIVE

## 2021-12-11 LAB — HEPARIN LEVEL (UNFRACTIONATED)
Heparin Unfractionated: 0.33 IU/mL (ref 0.30–0.70)
Heparin Unfractionated: <0.1 IU/mL — ABNORMAL LOW (ref 0.30–0.70)

## 2021-12-11 LAB — GLUCOSE, CAPILLARY: Glucose-Capillary: 174 mg/dL — ABNORMAL HIGH (ref 70–99)

## 2021-12-11 LAB — CBG MONITORING, ED
Glucose-Capillary: 113 mg/dL — ABNORMAL HIGH (ref 70–99)
Glucose-Capillary: 112 mg/dL — ABNORMAL HIGH (ref 70–99)

## 2021-12-11 LAB — PROTIME-INR
INR: 1 (ref 0.8–1.2)
Prothrombin Time: 13.2 seconds (ref 11.4–15.2)

## 2021-12-11 LAB — TROPONIN I (HIGH SENSITIVITY)
Troponin I (High Sensitivity): 16 ng/L (ref <18)
Troponin I (High Sensitivity): 14 ng/L (ref <18)

## 2021-12-11 MED ORDER — FLUTICASONE PROPIONATE 50 MCG/ACT NA SUSP
1.0000 | Freq: Every day | NASAL | Status: DC | PRN
  Filled 2021-12-11: qty 16

## 2021-12-11 MED ORDER — PROPRANOLOL HCL 20 MG PO TABS
40.0000 mg | ORAL_TABLET | Freq: Two times a day (BID) | ORAL | Status: DC
  Filled 2021-12-11: qty 2

## 2021-12-11 MED ORDER — ASPIRIN 81 MG PO CHEW
81.0000 mg | CHEWABLE_TABLET | Freq: Every day | ORAL | Status: DC
  Administered 2021-12-11 – 2021-12-13 (×3): 81 mg via ORAL
  Filled 2021-12-11 (×4): qty 1

## 2021-12-11 MED ORDER — ISOSORBIDE MONONITRATE ER 30 MG PO TB24
30.0000 mg | ORAL_TABLET | Freq: Every day | ORAL | Status: DC
  Administered 2021-12-11 – 2021-12-14 (×4): 30 mg via ORAL
  Filled 2021-12-11 (×4): qty 1

## 2021-12-11 MED ORDER — PANTOPRAZOLE SODIUM 40 MG PO TBEC
40.0000 mg | DELAYED_RELEASE_TABLET | Freq: Every day | ORAL | Status: DC
  Administered 2021-12-11 – 2021-12-14 (×4): 40 mg via ORAL
  Filled 2021-12-11 (×4): qty 1

## 2021-12-11 MED ORDER — OXYCODONE HCL 5 MG PO TABS: 5.0000 mg | ORAL_TABLET | ORAL | Status: DC | PRN

## 2021-12-11 MED ORDER — TRIAMTERENE-HCTZ 37.5-25 MG PO TABS
1.0000 | ORAL_TABLET | Freq: Every day | ORAL | Status: DC
  Administered 2021-12-11 – 2021-12-14 (×4): 1 via ORAL
  Filled 2021-12-11 (×4): qty 1

## 2021-12-11 MED ORDER — BISOPROLOL FUMARATE 5 MG PO TABS
2.5000 mg | ORAL_TABLET | Freq: Two times a day (BID) | ORAL | Status: DC
  Filled 2021-12-11: qty 0.5

## 2021-12-11 MED ORDER — MORPHINE SULFATE (PF) 2 MG/ML IV SOLN: 2.0000 mg | INTRAVENOUS | Status: DC | PRN

## 2021-12-11 MED ORDER — ALPRAZOLAM 0.25 MG PO TABS: 0.2500 mg | ORAL_TABLET | Freq: Every day | ORAL | Status: DC | PRN

## 2021-12-11 MED ORDER — ROSUVASTATIN CALCIUM 5 MG PO TABS
5.0000 mg | ORAL_TABLET | Freq: Every day | ORAL | Status: DC
  Administered 2021-12-11 – 2021-12-13 (×3): 5 mg via ORAL
  Filled 2021-12-11 (×3): qty 1

## 2021-12-11 MED ORDER — ACETAMINOPHEN 325 MG PO TABS: 650.0000 mg | ORAL_TABLET | ORAL | Status: DC | PRN

## 2021-12-11 MED ORDER — DONEPEZIL HCL 5 MG PO TABS
5.0000 mg | ORAL_TABLET | Freq: Every day | ORAL | Status: DC
  Administered 2021-12-11 – 2021-12-13 (×3): 5 mg via ORAL
  Filled 2021-12-11 (×4): qty 1

## 2021-12-11 MED ORDER — ALUM & MAG HYDROXIDE-SIMETH 200-200-20 MG/5ML PO SUSP
30.0000 mL | Freq: Once | ORAL | Status: AC
  Administered 2021-12-11: 30 mL via ORAL
  Filled 2021-12-11: qty 30

## 2021-12-11 MED ORDER — IOHEXOL 350 MG/ML SOLN
100.0000 mL | Freq: Once | INTRAVENOUS | Status: AC | PRN
  Administered 2021-12-11: 100 mL via INTRAVENOUS

## 2021-12-11 MED ORDER — HEPARIN (PORCINE) 25000 UT/250ML-% IV SOLN
1350.0000 [IU]/h | INTRAVENOUS | Status: DC
Start: 2021-12-11 — End: 2021-12-14
  Administered 2021-12-11: 05:00:00 950 [IU]/h via INTRAVENOUS
  Administered 2021-12-12: 10:00:00 1100 [IU]/h via INTRAVENOUS
  Administered 2021-12-14: 05:00:00 1350 [IU]/h via INTRAVENOUS
  Filled 2021-12-11 (×4): qty 250

## 2021-12-11 MED ORDER — ALBUTEROL SULFATE (2.5 MG/3ML) 0.083% IN NEBU: 1.2500 mg | INHALATION_SOLUTION | Freq: Four times a day (QID) | RESPIRATORY_TRACT | Status: DC | PRN

## 2021-12-11 MED ORDER — LACTATED RINGERS IV BOLUS
1000.0000 mL | Freq: Once | INTRAVENOUS | Status: AC
  Administered 2021-12-11: 1000 mL via INTRAVENOUS

## 2021-12-11 MED ORDER — MORPHINE SULFATE (PF) 2 MG/ML IV SOLN
2.0000 mg | INTRAVENOUS | Status: DC | PRN
  Administered 2021-12-11: 2 mg via INTRAVENOUS
  Filled 2021-12-11: qty 1

## 2021-12-11 MED ORDER — NITROGLYCERIN 2 % TD OINT
1.0000 [in_us] | TOPICAL_OINTMENT | Freq: Once | TRANSDERMAL | Status: AC
  Administered 2021-12-11: 1 [in_us] via TOPICAL
  Filled 2021-12-11: qty 1

## 2021-12-11 MED ORDER — DIPHENHYDRAMINE HCL 25 MG PO CAPS
50.0000 mg | ORAL_CAPSULE | Freq: Once | ORAL | Status: AC
  Administered 2021-12-11: 50 mg via ORAL
  Filled 2021-12-11: qty 2

## 2021-12-11 MED ORDER — MONTELUKAST SODIUM 10 MG PO TABS
10.0000 mg | ORAL_TABLET | Freq: Every day | ORAL | Status: DC
  Administered 2021-12-11 – 2021-12-13 (×3): 10 mg via ORAL
  Filled 2021-12-11 (×3): qty 1

## 2021-12-11 MED ORDER — VENLAFAXINE HCL ER 37.5 MG PO CP24
37.5000 mg | ORAL_CAPSULE | Freq: Every day | ORAL | Status: DC
  Administered 2021-12-11 – 2021-12-14 (×4): 37.5 mg via ORAL
  Filled 2021-12-11 (×4): qty 1

## 2021-12-11 MED ORDER — ADULT MULTIVITAMIN W/MINERALS CH
ORAL_TABLET | Freq: Every day | ORAL | Status: DC
  Administered 2021-12-11 – 2021-12-14 (×4): 1 via ORAL
  Filled 2021-12-11 (×4): qty 1

## 2021-12-11 MED ORDER — NITROGLYCERIN 0.4 MG SL SUBL: 0.4000 mg | SUBLINGUAL_TABLET | SUBLINGUAL | Status: DC | PRN

## 2021-12-11 MED ORDER — INSULIN ASPART 100 UNIT/ML IJ SOLN
0.0000 [IU] | Freq: Three times a day (TID) | INTRAMUSCULAR | Status: DC
Start: 2021-12-11 — End: 2021-12-14
  Administered 2021-12-11 – 2021-12-12 (×2): 2 [IU] via SUBCUTANEOUS
  Administered 2021-12-12: 10:00:00 1 [IU] via SUBCUTANEOUS
  Administered 2021-12-12: 21:00:00 3 [IU] via SUBCUTANEOUS
  Administered 2021-12-13: 18:00:00 1 [IU] via SUBCUTANEOUS
  Administered 2021-12-13: 21:00:00 5 [IU] via SUBCUTANEOUS
  Administered 2021-12-13 (×2): 1 [IU] via SUBCUTANEOUS
  Filled 2021-12-11 (×8): qty 1

## 2021-12-11 MED ORDER — ASPIRIN EC 81 MG PO TBEC: 81.0000 mg | DELAYED_RELEASE_TABLET | Freq: Every day | ORAL | Status: DC

## 2021-12-11 MED ORDER — ONDANSETRON HCL 4 MG/2ML IJ SOLN: 4.0000 mg | Freq: Four times a day (QID) | INTRAMUSCULAR | Status: DC | PRN

## 2021-12-11 MED ORDER — SODIUM CHLORIDE 0.9 % IV SOLN: INTRAVENOUS | Status: DC

## 2021-12-11 MED ORDER — HEPARIN BOLUS VIA INFUSION
4000.0000 [IU] | Freq: Once | INTRAVENOUS | Status: AC
  Administered 2021-12-11: 4000 [IU] via INTRAVENOUS
  Filled 2021-12-11: qty 4000

## 2021-12-11 MED ORDER — DILTIAZEM HCL ER COATED BEADS 120 MG PO CP24
240.0000 mg | ORAL_CAPSULE | Freq: Every morning | ORAL | Status: DC
  Administered 2021-12-11 – 2021-12-14 (×4): 240 mg via ORAL
  Filled 2021-12-11 (×2): qty 2
  Filled 2021-12-11: qty 1
  Filled 2021-12-11: qty 2

## 2021-12-11 MED ORDER — GLUCOSAMINE-CHONDROITIN 500-400 MG PO TABS: 1.0000 | ORAL_TABLET | Freq: Every day | ORAL | Status: DC

## 2021-12-11 MED ORDER — MECLIZINE HCL 25 MG PO TABS
25.0000 mg | ORAL_TABLET | Freq: Every day | ORAL | Status: DC
  Administered 2021-12-11 – 2021-12-13 (×3): 25 mg via ORAL
  Filled 2021-12-11 (×4): qty 1

## 2021-12-11 MED ORDER — PROPRANOLOL HCL 40 MG PO TABS
60.0000 mg | ORAL_TABLET | Freq: Two times a day (BID) | ORAL | Status: DC
  Administered 2021-12-11 – 2021-12-14 (×7): 60 mg via ORAL
  Filled 2021-12-11 (×5): qty 1
  Filled 2021-12-11: qty 3
  Filled 2021-12-11 (×2): qty 1

## 2021-12-11 MED ORDER — LIDOCAINE VISCOUS HCL 2 % MT SOLN
15.0000 mL | Freq: Once | OROMUCOSAL | Status: AC
  Administered 2021-12-11: 15 mL via ORAL
  Filled 2021-12-11: qty 15

## 2021-12-11 MED ORDER — BISOPROLOL FUMARATE 5 MG PO TABS: 5.0000 mg | ORAL_TABLET | Freq: Two times a day (BID) | ORAL | Status: DC

## 2021-12-11 MED ORDER — SODIUM CHLORIDE 0.9% FLUSH
3.0000 mL | Freq: Two times a day (BID) | INTRAVENOUS | Status: DC
  Administered 2021-12-11 – 2021-12-13 (×3): 3 mL via INTRAVENOUS

## 2021-12-11 NOTE — Progress Notes (Signed)
[place] for heparin infusion Indication: unstable angina - bolus + infusion (ACS/STEMI)  Allergies  Allergen Reactions   Ace Inhibitors Cough   Metoprolol Diarrhea   Sulfa Antibiotics Rash    Patient Measurements: Height: 5\' 4"  (162.6 cm) Weight: 77.3 kg (170 lb 6.7 oz) (From PCP visit on 12/07/21) IBW/kg (Calculated) : 59.2 Heparin Dosing Weight: 75 kg  Vital Signs: Temp: 98 F (36.7 C) (12/17 1452) Temp Source: Oral (12/17 1452) BP: 140/81 (12/17 1452) Pulse Rate: 85 (12/17 1430)  Labs: Recent Labs    12/10/21 1916 12/10/21 2147 12/11/21 0430 12/11/21 0702 12/11/21 1403  HGB 12.4*  --   --   --   --   HCT 35.7*  --   --   --   --   PLT 202  --   --   --   --   APTT  --   --  26  --   --   LABPROT  --   --  13.2  --   --   INR  --   --  1.0  --   --   HEPARINUNFRC  --   --   --   --  0.33  CREATININE 0.88  --   --   --   --   TROPONINIHS 12 13 16 14   --      Estimated Creatinine Clearance: 63.9 mL/min (by C-G formula based on SCr of 0.88 mg/dL).   Medical History: Past Medical History:  Diagnosis Date   Anxiety    Arthritis    Asthma    Colon polyps    COPD (chronic obstructive pulmonary disease) ([place])    Depression    Diabetes ([place])    Difficult intubation    GERD (gastroesophageal reflux disease)    High cholesterol    Hyperlipidemia    Hypertension    Sleep apnea     Assessment: Pt is 80 yo male with h/o cardiac cath presenting to ED "for evaluation of acute onset of chest pain and palpitations."  12/17 1403 HL 0.33, 950 units/hr  Goal of Therapy:  Heparin level 0.3-0.7 units/ml Monitor platelets by anticoagulation protocol: Yes   Plan:  Heparin therapeutic Continue heparin infusion at 950 units/hr Check confirmatory HL in 8 hr  CBC daily while on heparin  Dorothe Pea, PharmD, BCPS Clinical Pharmacist   12/11/2021 3:05 PM

## 2021-12-11 NOTE — Progress Notes (Signed)
[place] for heparin infusion Indication: unstable angina - bolus + infusion (ACS/STEMI)  Allergies  Allergen Reactions   Ace Inhibitors Cough   Metoprolol Diarrhea   Sulfa Antibiotics Rash    Patient Measurements:   Heparin Dosing Weight: 75 kg  Vital Signs: Temp: 98 F (36.7 C) (12/16 1910) Temp Source: Oral (12/16 1910) BP: 154/93 (12/17 0300) Pulse Rate: 113 (12/17 0300)  Labs: Recent Labs    12/10/21 1916 12/10/21 2147  HGB 12.4*  --   HCT 35.7*  --   PLT 202  --   CREATININE 0.88  --   TROPONINIHS 12 13    CrCl cannot be calculated (Unknown ideal weight.).   Medical History: Past Medical History:  Diagnosis Date   Anxiety    Arthritis    Asthma    Colon polyps    COPD (chronic obstructive pulmonary disease) ([place])    Depression    Diabetes ([place])    Difficult intubation    GERD (gastroesophageal reflux disease)    High cholesterol    Hyperlipidemia    Hypertension    Sleep apnea     Assessment: Pt is 80 yo male with h/o cardiac cath presenting to ED "for evaluation of acute onset of chest pain and palpitations."  Goal of Therapy:  Heparin level 0.3-0.7 units/ml Monitor platelets by anticoagulation protocol: Yes   Plan:  Bolus 4000 units x 1 Start heparin infusion at 950 units/hr Check HL in 8 hr after start of infusion CBC daily while on heparin  Renda Rolls, PharmD, [facility] 12/11/2021 3:42 AM

## 2021-12-11 NOTE — ED Provider Notes (Addendum)
[facility] Emergency Department Provider Note  ____________________________________________   Event Date/Time   First MD Initiated Contact with Patient 12/11/21 0104     (approximate)  I have reviewed the triage vital signs and the nursing notes.   HISTORY  Chief Complaint No chief complaint on file.    HPI Brett Blake is a 80 y.o. male with medical history as listed below who presents for evaluation of acute onset of chest pain and palpitations.  He said that it happened tonight while he was getting ready for dinner with his wife.  It occurred acutely and was severe with a feeling of heart racing and pressure on his chest.  Occasionally he has such stabbing pain.  It is persisting although it felt a little bit better after he took his home nitroglycerin.  He also took 4 baby aspirin at the same time at home.  he reports that Dr. Saralyn Pilar is his cardiologist.  He had a catheterization a few years ago.  He says he has never had a heart attack.  He had a blood clot once in his right arm but has not had any blood clot in his legs or his lungs.  He takes aspirin but is not on any other blood thinners.  He denies fever, nausea, vomiting, and abdominal pain.  He did have some shortness of breath associated with the chest pressure.  He feels a little bit better right now but still feels mild pressure on his chest and feels palpitations.     Past Medical History:  Diagnosis Date   Anxiety    Arthritis    Asthma    Colon polyps    COPD (chronic obstructive pulmonary disease) ([place])    Depression    Diabetes ([place])    Difficult intubation    GERD (gastroesophageal reflux disease)    High cholesterol    Hyperlipidemia    Hypertension    Sleep apnea     Patient Active Problem List   Diagnosis Date Noted   Unstable angina ([place]) 12/11/2021   Sepsis ([place]) 05/20/2021   OSA (obstructive sleep apnea) 05/20/2021   Acute gastroenteritis 05/20/2021    Chest pain 07/16/2020   Depression    Cholecystitis 05/08/2019   Paroxysmal tachycardia ([place]) 02/23/2017   Colon polyp 06/29/2016   Hypertension 06/29/2016   Elevated PSA 06/29/2016   Combined fat and carbohydrate induced hyperlipemia 04/19/2016   Controlled type 2 diabetes mellitus without complication ([place]) 76/54/6503   Insomnia, persistent 09/03/2015   Chronic insomnia 09/03/2015   Chronic bronchitis ([place]) 12/14/2011    Past Surgical History:  Procedure Laterality Date   BRONCHOSCOPY     CARDIAC CATHETERIZATION Left 11/04/2016   Procedure: Left Heart Cath and Coronary Angiography;  Surgeon: Teodoro Spray, MD;  Location: [facility];  Service: Cardiovascular;  Laterality: Left;   CARDIAC CATHETERIZATION     CHOLECYSTECTOMY N/A 05/09/2019   Procedure: LAPAROSCOPIC CHOLECYSTECTOMY WITH INTRAOPERATIVE CHOLANGIOGRAM;  Surgeon: Jules Husbands, MD;  Location: [facility] OR[facility];  Service: General;  Laterality: N/A;   COLOSTOMY     ROTATOR CUFF REPAIR Left    THUMB ARTHROSCOPY  2015   TONSILLECTOMY  1952   TONSILLECTOMY      Prior to Admission medications   Medication Sig Start Date End Date Taking? Authorizing Provider  albuterol (ACCUNEB) 1.25 MG/3ML nebulizer solution TAKE 3 MLS (1.25 MG TOTAL) BY NEBULIZATION EVERY 6 (SIX) HOURS AS NEEDED FOR WHEEZING 04/08/19  Yes [provider]  ALPRAZolam (XANAX) 0.5 MG tablet Take 0.5 tablets by mouth daily as needed. 03/05/19  Yes [provider]  aspirin 81 MG chewable tablet Chew 81 mg by mouth daily.   Yes [provider]  bisoprolol (ZEBETA) 5 MG tablet Take 2.5 mg by mouth 2 (two) times daily. 07/02/20  Yes [provider]  diltiazem (CARDIZEM CD) 240 MG 24 hr capsule Take 240 mg by mouth every morning. 12/07/21  Yes [provider]  fluticasone (FLONASE) 50 MCG/ACT nasal spray Place 1 spray into both nostrils daily as needed for allergies or rhinitis.   Yes [provider]   glucosamine-chondroitin 500-400 MG tablet Take 1 tablet by mouth daily.   Yes [provider]  ibuprofen (ADVIL,MOTRIN) 200 MG tablet Take 200 mg by mouth every 6 (six) hours as needed.   Yes [provider]  isosorbide mononitrate (IMDUR) 30 MG 24 hr tablet Take 30 mg by mouth daily. 06/16/20  Yes [provider]  meclizine (ANTIVERT) 25 MG tablet Take 25 mg by mouth at bedtime. 11/17/21  Yes [provider]  montelukast (SINGULAIR) 10 MG tablet Take 10 mg by mouth at bedtime.   Yes [provider]  Multiple Vitamins-Minerals (MULTIVITAMIN PO) Take 1 tablet by mouth daily.   Yes [provider]  omeprazole (PRILOSEC) 40 MG capsule Take 40 mg by mouth daily. 06/05/20  Yes [provider]  propranolol (INDERAL) 40 MG tablet Take 40 mg by mouth 2 (two) times daily. 12/05/21  Yes [provider]  rosuvastatin (CRESTOR) 5 MG tablet Take 5 mg by mouth daily. 12/06/21  Yes [provider]  SYMBICORT 80-4.5 MCG/ACT inhaler Inhale 2 puffs into the lungs 2 (two) times daily as needed.  05/19/20  Yes [provider]  triamterene-hydrochlorothiazide (DYAZIDE) 37.5-25 MG per capsule Take 1 capsule by mouth daily.   Yes [provider]  venlafaxine XR (EFFEXOR-XR) 37.5 MG 24 hr capsule Take 37.5 mg by mouth daily. 12/07/21  Yes [provider]  amitriptyline (ELAVIL) 10 MG tablet Take 10 mg by mouth at bedtime. Patient not taking: Reported on 12/11/2021 07/02/20   [provider]  donepezil (ARICEPT) 5 MG tablet Take 5 mg by mouth at bedtime.  05/14/20 05/14/21  [provider]  nitroGLYCERIN (NITROSTAT) 0.4 MG SL tablet Place 0.4 mg under the tongue every 5 (five) minutes as needed for chest pain.    [provider]  primidone (MYSOLINE) 50 MG tablet Take 50 mg by mouth daily. Patient not taking: Reported on 12/11/2021 04/22/21   [provider]    Allergies Ace  inhibitors, Metoprolol, and Sulfa antibiotics  Family History  Problem Relation Age of Onset   Heart attack Mother    Prostate cancer Father    Leukemia Brother    Prostate cancer Brother     Social History Social History   Tobacco Use   Smoking status: Former    Packs/day: 1.00    Years: 15.00    Pack years: 15.00    Types: Cigarettes   Smokeless tobacco: Never   Tobacco comments:    quit 1977  Substance Use Topics   Alcohol use: Yes    Alcohol/week: 1.0 - 2.0 standard drink    Types: 1 - 2 Shots of liquor per week   Drug use: No    Review of Systems Constitutional: No fever/chills Eyes: No visual changes. ENT: No sore throat. Cardiovascular: Positive for chest pain and pressure.  Positive for palpitations Respiratory: Positive  for shortness of breath Gastrointestinal: No abdominal pain.  No nausea, no vomiting.  No diarrhea.  No constipation. Genitourinary: Negative for dysuria. Musculoskeletal: Negative for neck pain.  Negative for back pain. Integumentary: Negative for rash. Neurological: Negative for headaches, focal weakness or numbness.   ____________________________________________   PHYSICAL EXAM:  VITAL SIGNS: ED Triage Vitals  Enc Vitals Group     BP 12/10/21 1910 (!) 156/92     Pulse Rate 12/10/21 1910 (!) 132     Resp 12/10/21 1910 20     Temp 12/10/21 1910 98 F (36.7 C)     Temp Source 12/10/21 1910 Oral     SpO2 12/10/21 1910 96 %     Weight --      Height --      Head Circumference --      Peak Flow --      Pain Score 12/10/21 1911 5     Pain Loc --      Pain Edu? --      Excl. in [place]? --     Constitutional: Alert and oriented.  Eyes: Conjunctivae are normal.  Head: Atraumatic. Nose: No congestion/rhinnorhea. Mouth/Throat: Patient is wearing a mask. Neck: No stridor.  No meningeal signs.   Cardiovascular: Tachycardia between the 110s and 130s, regular rhythm. Good peripheral circulation. Respiratory: Normal respiratory effort.   No retractions. Gastrointestinal: Soft and nontender. No distention.  Musculoskeletal: No lower extremity tenderness nor edema. No gross deformities of extremities. Neurologic:  Normal speech and language. No gross focal neurologic deficits are appreciated.  Skin:  Skin is warm, dry and intact. Psychiatric: Mood and affect are anxious but generally appropriate under the circumstances.  ____________________________________________   LABS (all labs ordered are listed, but only abnormal results are displayed)  Labs Reviewed  COMPREHENSIVE METABOLIC PANEL - Abnormal; Notable for the following components:      Result Value   Potassium 3.1 (*)    Chloride 97 (*)    Glucose, Bld 117 (*)    Calcium 8.7 (*)    All other components within normal limits  CBC WITH DIFFERENTIAL/PLATELET - Abnormal; Notable for the following components:   WBC 3.9 (*)    RBC 3.70 (*)    Hemoglobin 12.4 (*)    HCT 35.7 (*)    All other components within normal limits  APTT  PROTIME-INR  TROPONIN I (HIGH SENSITIVITY)  TROPONIN I (HIGH SENSITIVITY)   ____________________________________________  EKG  ED ECG REPORT I, Hinda Kehr, the attending physician, personally viewed and interpreted this ECG.  Date: 12/10/2021 EKG Time: 19: 25 Rate: 130 Rhythm: Sinus tachycardia with occasional PVC QRS Axis: normal Intervals: normal ST/T Wave abnormalities: Non-specific ST segment / T-wave changes, but no clear evidence of acute ischemia. Narrative Interpretation: no definitive evidence of acute ischemia; does not meet STEMI criteria.  ED ECG REPORT I, Hinda Kehr, the attending physician, personally viewed and interpreted this ECG.  Date: 12/11/2021 EKG Time: 2:13 AM Rate: 108 Rhythm: Sinus tachycardia QRS Axis: normal Intervals: normal ST/T Wave abnormalities: Non-specific ST segment / T-wave changes, but no clear evidence of acute ischemia. Narrative Interpretation: Heavy artifact is present but there  is no obvious indication of acute ischemia ____________________________________________  RADIOLOGY I, Hinda Kehr, personally viewed and evaluated these images (plain radiographs) as part of my medical decision making, as well as reviewing the written report by the radiologist.  ED MD interpretation: No acute abnormality on chest x-ray.  Official radiology report(s): DG Chest 1 View  Result Date: 12/10/2021 CLINICAL DATA:  Chest pain EXAM: CHEST  1 VIEW COMPARISON:  05/19/2021 FINDINGS: The heart size and mediastinal contours are within normal limits. Both lungs are clear save for stable granuloma at the left lung base. The visualized skeletal structures are unremarkable. IMPRESSION: No active disease. Electronically Signed   By: Fidela Salisbury M.D.   On: 12/10/2021 19:41   CT Angio Chest PE W and/or Wo Contrast  Result Date: 12/11/2021 CLINICAL DATA:  Pulmonary embolism (PE) suspected, high prob, chest pain, dizziness EXAM: CT ANGIOGRAPHY CHEST WITH CONTRAST TECHNIQUE: Multidetector CT imaging of the chest was performed using the standard protocol during bolus administration of intravenous contrast. Multiplanar CT image reconstructions and MIPs were obtained to evaluate the vascular anatomy. CONTRAST:  140mL OMNIPAQUE IOHEXOL 350 MG/ML SOLN COMPARISON:  None. FINDINGS: Cardiovascular: Adequate opacification of the pulmonary arterial tree. No intraluminal filling defect identified to suggest acute pulmonary embolism. Central pulmonary arteries are of normal caliber. Mild coronary artery calcification. Cardiac size within normal limits. No pericardial effusion. No significant atherosclerotic calcification within the thoracic aorta. No aortic aneurysm. Mediastinum/Nodes: No enlarged mediastinal, hilar, or axillary lymph nodes. Thyroid gland, trachea, and esophagus demonstrate no significant findings. Lungs/Pleura: Lungs are clear save for a benign calcified granuloma within the left lower lobe. No  pleural effusion or pneumothorax. Upper Abdomen: Cholecystectomy has been performed. Musculoskeletal: Degenerative changes are seen within the thoracic spine. No lytic or blastic bone lesion. Review of the MIP images confirms the above findings. IMPRESSION: No pulmonary embolism. Mild coronary artery calcification. Electronically Signed   By: Fidela Salisbury M.D.   On: 12/11/2021 02:28    ____________________________________________   PROCEDURES   Procedure(s) performed (including Critical Care):  .1-3 Lead EKG Interpretation Performed by: Hinda Kehr, MD Authorized by: Hinda Kehr, MD     Interpretation: abnormal     ECG rate:  110   ECG rate assessment: tachycardic     Rhythm: sinus tachycardia     Ectopy: none     Conduction: normal   .Critical Care Performed by: Hinda Kehr, MD Authorized by: Hinda Kehr, MD   Critical care provider statement:    Critical care time (minutes):  30   Critical care time was exclusive of:  Separately billable procedures and treating other patients   Critical care was necessary to treat or prevent imminent or life-threatening deterioration of the following conditions:  Circulatory failure (unstable angina on heparin)   Critical care was time spent personally by me on the following activities:  Development of treatment plan with patient or surrogate, evaluation of patient's response to treatment, examination of patient, obtaining history from patient or surrogate, ordering and performing treatments and interventions, ordering and review of laboratory studies, ordering and review of radiographic studies, pulse oximetry, re-evaluation of patient's condition and review of old charts   ____________________________________________   INITIAL IMPRESSION / MDM / [place] / ED COURSE  As part of my medical decision making, I reviewed the following data within the [place] notes reviewed and incorporated, Labs  reviewed , EKG interpreted , Old chart reviewed, Radiograph reviewed , and Notes from prior ED visits   Differential diagnosis includes, but is not limited to, angina, ACS, PE, pneumonia, AAS, less likely biliary colic.  The patient is on the cardiac monitor to evaluate for evidence of arrhythmia and/or significant heart rate changes.  I personally reviewed the patient's imaging and agree with the radiologist's interpretation that there are no acute abnormalities  on chest x-ray.  High-sensitivity troponin is within normal limits.  Comprehensive metabolic panel was generally reassuring other than some mild hypokalemia; there is no elevation of LFTs and the patient has no tenderness to palpation of his abdomen.  CBC is generally unremarkable with no anemia and no leukocytosis.  I reviewed the medical record including his catheterization report from 2017 which demonstrated a 50% stenosis of the proximal LAD, but that was 5+ years ago.  He is having symptoms consistent with unstable angina, chest pressure that feels better after nitroglycerin.  He is having persistent discomfort at this time and I ordered nitroglycerin 1 inch paste.  Vital signs are notable for hypertension as well as tachycardia that was as high as the 130s.  Anxiety could be contributing to this, but I evaluated with a CTA chest to rule out pulmonary embolism.  There is no evidence of PE nor of infectious process on the CTA chest.  Given his risk factors and episodic chest pain at rest and different from prior with persistent tachycardia even after LR IV bolus, I will start patient on heparin bolus plus infusion for unstable angina and admit for further evaluation and monitoring.  Patient agrees with the plan and states that he is scared to go home with the way that he feels.     Clinical Course as of 12/11/21 0346  Sat Dec 11, 2021  0316 Discussed case in person with Dr. [place] Ace with the hospitalist service and he will admit [CF]     Clinical Course User Index [CF] Hinda Kehr, MD     ____________________________________________  FINAL CLINICAL IMPRESSION(S) / ED DIAGNOSES  Final diagnoses:  Unstable angina ([place])  Sinus tachycardia  Palpitations     MEDICATIONS GIVEN DURING THIS VISIT:  Medications  heparin bolus via infusion 4,000 Units (has no administration in time range)  heparin ADULT infusion 100 units/mL (25000 units/233mL) (has no administration in time range)  lactated ringers bolus 1,000 mL (1,000 mLs Intravenous New Bag/Given 12/11/21 0129)  nitroGLYCERIN (NITROGLYN) 2 % ointment 1 inch (1 inch Topical Given 12/11/21 0127)  iohexol (OMNIPAQUE) 350 MG/ML injection 100 mL (100 mLs Intravenous Contrast Given 12/11/21 0150)     ED Discharge Orders     None        Note:  This document was prepared using Dragon voice recognition software and may include unintentional dictation errors.   Hinda Kehr, MD 12/11/21 2440    Hinda Kehr, MD 12/11/21 0400

## 2021-12-11 NOTE — Consult Note (Signed)
[facility] Consultation Note  Patient ID: Brett Blake, MRN: [id number], DOB/AGE: 1941/06/15 80 y.o. Admit date: 12/11/2021   Date of Consult: 12/11/2021 Primary Physician: [name], MD Primary Cardiologist: Paraschos  Chief Complaint: No chief complaint on file.  Reason for Consult:  Unstable angina  HPI: 80 y.o. male with known coronary artery disease with 50% left anterior descending artery stenosis several years prior by cardiac catheterization sleep apnea hypertension hyperlipidemia who has had significant new onset of chest discomfort substernal in nature with severe shortness of breath palpitations irregular heartbeats and dizziness.  This has been relieved by nitroglycerin as well as heparin when seen in the emergency room.  At that time the patient had an EKG showing normal sinus rhythm with poor R wave progression and a troponin of 14/13.  Since then he has had no further evidence of chest discomfort and is comfortable at this time  Past Medical History:  Diagnosis Date   Anxiety    Arthritis    Asthma    Colon polyps    COPD (chronic obstructive pulmonary disease) ([place])    Depression    Diabetes ([place])    Difficult intubation    GERD (gastroesophageal reflux disease)    High cholesterol    Hyperlipidemia    Hypertension    Sleep apnea       Surgical History:  Past Surgical History:  Procedure Laterality Date   BRONCHOSCOPY     CARDIAC CATHETERIZATION Left 11/04/2016   Procedure: Left Heart Cath and Coronary Angiography;  Surgeon: Teodoro Spray, MD;  Location: [place] [facility];  Service: Cardiovascular;  Laterality: Left;   CARDIAC CATHETERIZATION     CHOLECYSTECTOMY N/A 05/09/2019   Procedure: LAPAROSCOPIC CHOLECYSTECTOMY WITH INTRAOPERATIVE CHOLANGIOGRAM;  Surgeon: Jules Husbands, MD;  Location: [facility];  Service: General;  Laterality: N/A;   COLOSTOMY     ROTATOR CUFF REPAIR Left    THUMB ARTHROSCOPY  2015   TONSILLECTOMY  1952    TONSILLECTOMY       Home Meds: Prior to Admission medications   Medication Sig Start Date End Date Taking? Authorizing Provider  albuterol (ACCUNEB) 1.25 MG/3ML nebulizer solution TAKE 3 MLS (1.25 MG TOTAL) BY NEBULIZATION EVERY 6 (SIX) HOURS AS NEEDED FOR WHEEZING 04/08/19  Yes [provider]  ALPRAZolam (XANAX) 0.5 MG tablet Take 0.5 tablets by mouth daily as needed. 03/05/19  Yes [provider]  aspirin 81 MG chewable tablet Chew 81 mg by mouth daily.   Yes [provider]  bisoprolol (ZEBETA) 5 MG tablet Take 2.5 mg by mouth 2 (two) times daily. 07/02/20  Yes [provider]  diltiazem (CARDIZEM CD) 240 MG 24 hr capsule Take 240 mg by mouth every morning. 12/07/21  Yes [provider]  fluticasone (FLONASE) 50 MCG/ACT nasal spray Place 1 spray into both nostrils daily as needed for allergies or rhinitis.   Yes [provider]  glucosamine-chondroitin 500-400 MG tablet Take 1 tablet by mouth daily.   Yes [provider]  ibuprofen (ADVIL,MOTRIN) 200 MG tablet Take 200 mg by mouth every 6 (six) hours as needed.   Yes [provider]  isosorbide mononitrate (IMDUR) 30 MG 24 hr tablet Take 30 mg by mouth daily. 06/16/20  Yes [provider]  meclizine (ANTIVERT) 25 MG tablet Take 25 mg by mouth at bedtime. 11/17/21  Yes [provider]  montelukast (SINGULAIR) 10 MG tablet Take 10 mg by mouth at bedtime.   Yes  [provider]  Multiple Vitamins-Minerals (MULTIVITAMIN PO) Take 1 tablet by mouth daily.   Yes [provider]  omeprazole (PRILOSEC) 40 MG capsule Take 40 mg by mouth daily. 06/05/20  Yes [provider]  propranolol (INDERAL) 40 MG tablet Take 40 mg by mouth 2 (two) times daily. 12/05/21  Yes [provider]  rosuvastatin (CRESTOR) 5 MG tablet Take 5 mg by mouth daily. 12/06/21  Yes [provider]  SYMBICORT 80-4.5 MCG/ACT inhaler Inhale 2 puffs into  the lungs 2 (two) times daily as needed.  05/19/20  Yes [provider]  triamterene-hydrochlorothiazide (DYAZIDE) 37.5-25 MG per capsule Take 1 capsule by mouth daily.   Yes [provider]  venlafaxine XR (EFFEXOR-XR) 37.5 MG 24 hr capsule Take 37.5 mg by mouth daily. 12/07/21  Yes [provider]  amitriptyline (ELAVIL) 10 MG tablet Take 10 mg by mouth at bedtime. Patient not taking: Reported on 12/11/2021 07/02/20   [provider]  donepezil (ARICEPT) 5 MG tablet Take 5 mg by mouth at bedtime.  05/14/20 05/14/21  [provider]  nitroGLYCERIN (NITROSTAT) 0.4 MG SL tablet Place 0.4 mg under the tongue every 5 (five) minutes as needed for chest pain.    [provider]  primidone (MYSOLINE) 50 MG tablet Take 50 mg by mouth daily. Patient not taking: Reported on 12/11/2021 04/22/21   [provider]    Inpatient Medications:   aspirin  81 mg Oral Daily   diltiazem  240 mg Oral q morning   donepezil  5 mg Oral QHS   insulin aspart  0-9 Units Subcutaneous TID AC & HS   isosorbide mononitrate  30 mg Oral Daily   meclizine  25 mg Oral QHS   montelukast  10 mg Oral QHS   multivitamin with minerals   Oral Daily   pantoprazole  40 mg Oral Daily   propranolol  60 mg Oral BID   rosuvastatin  5 mg Oral Daily   sodium chloride flush  3 mL Intravenous Q12H   triamterene-hydrochlorothiazide  1 tablet Oral Daily   venlafaxine XR  37.5 mg Oral Daily    heparin 950 Units/hr (12/11/21 1620)    Allergies:  Allergies  Allergen Reactions   Ace Inhibitors Cough   Metoprolol Diarrhea   Sulfa Antibiotics Rash    Social History   Socioeconomic History   Marital status: Married    Spouse name: Not on file   Number of children: Not on file   Years of education: Not on file   Highest education level: Not on file  Occupational History   Not on file  Tobacco Use   Smoking status: Former    Packs/day: 1.00    Years: 15.00    Pack  years: 15.00    Types: Cigarettes   Smokeless tobacco: Never   Tobacco comments:    quit 1977  Vaping Use   Vaping Use: Not on file  Substance and Sexual Activity   Alcohol use: Yes    Alcohol/week: 1.0 - 2.0 standard drink    Types: 1 - 2 Shots of liquor per week   Drug use: No   Sexual activity: Not Currently  Other Topics Concern   Not on file  Social History Narrative   Not on file   Social Determinants of Health   Financial Resource Strain: Not on file  Food Insecurity: Not on file  Transportation Needs: Not on file  Physical Activity: Not on file  Stress: Not  on file  Social Connections: Not on file  Intimate Partner Violence: Not on file     Family History  Problem Relation Age of Onset   Heart attack Mother    Prostate cancer Father    Leukemia Brother    Prostate cancer Brother      Review of Systems Positive for chest pain Negative for: General:  chills, fever, night sweats or weight changes.  Cardiovascular: PND orthopnea syncope dizziness  Dermatological skin lesions rashes Respiratory: Cough congestion Urologic: Frequent urination urination at night and hematuria Abdominal: negative for nausea, vomiting, diarrhea, bright red blood per rectum, melena, or hematemesis Neurologic: negative for visual changes, and/or hearing changes  All other systems reviewed and are otherwise negative except as noted above.  Labs: No results for input(s): CKTOTAL, CKMB, TROPONINI in the last 72 hours. Lab Results  Component Value Date   WBC 3.9 (L) 12/10/2021   HGB 12.4 (L) 12/10/2021   HCT 35.7 (L) 12/10/2021   MCV 96.5 12/10/2021   PLT 202 12/10/2021    Recent Labs  Lab 12/10/21 1916  NA 136  K 3.1*  CL 97*  CO2 29  BUN 10  CREATININE 0.88  CALCIUM 8.7*  PROT 7.2  BILITOT 0.6  ALKPHOS 92  ALT 30  AST 38  GLUCOSE 117*   Lab Results  Component Value Date   CHOL 179 07/16/2020   HDL 65 07/16/2020   LDLCALC 87 07/16/2020   TRIG 133 07/16/2020    No results found for: DDIMER  Radiology/Studies:  DG Chest 1 View  Result Date: 12/10/2021 CLINICAL DATA:  Chest pain EXAM: CHEST  1 VIEW COMPARISON:  05/19/2021 FINDINGS: The heart size and mediastinal contours are within normal limits. Both lungs are clear save for stable granuloma at the left lung base. The visualized skeletal structures are unremarkable. IMPRESSION: No active disease. Electronically Signed   By: Fidela Salisbury M.D.   On: 12/10/2021 19:41   CT Angio Chest PE W and/or Wo Contrast  Result Date: 12/11/2021 CLINICAL DATA:  Pulmonary embolism (PE) suspected, high prob, chest pain, dizziness EXAM: CT ANGIOGRAPHY CHEST WITH CONTRAST TECHNIQUE: Multidetector CT imaging of the chest was performed using the standard protocol during bolus administration of intravenous contrast. Multiplanar CT image reconstructions and MIPs were obtained to evaluate the vascular anatomy. CONTRAST:  110mL OMNIPAQUE IOHEXOL 350 MG/ML SOLN COMPARISON:  None. FINDINGS: Cardiovascular: Adequate opacification of the pulmonary arterial tree. No intraluminal filling defect identified to suggest acute pulmonary embolism. Central pulmonary arteries are of normal caliber. Mild coronary artery calcification. Cardiac size within normal limits. No pericardial effusion. No significant atherosclerotic calcification within the thoracic aorta. No aortic aneurysm. Mediastinum/Nodes: No enlarged mediastinal, hilar, or axillary lymph nodes. Thyroid gland, trachea, and esophagus demonstrate no significant findings. Lungs/Pleura: Lungs are clear save for a benign calcified granuloma within the left lower lobe. No pleural effusion or pneumothorax. Upper Abdomen: Cholecystectomy has been performed. Musculoskeletal: Degenerative changes are seen within the thoracic spine. No lytic or blastic bone lesion. Review of the MIP images confirms the above findings. IMPRESSION: No pulmonary embolism. Mild coronary artery calcification.  Electronically Signed   By: Fidela Salisbury M.D.   On: 12/11/2021 02:28    EKG: Normal sinus rhythm with poor R wave progression  Weights: Filed Weights   12/11/21 0300  Weight: 77.3 kg     Physical Exam: Blood pressure 101/76, pulse 81, temperature 98.1 F (36.7 C), resp. rate 18, height 5\' 4"  (1.626 m), weight 77.3 kg, SpO2  98 %. Body mass index is 29.25 kg/m. General: Well developed, well nourished, in no acute distress. Head eyes ears nose throat: Normocephalic, atraumatic, sclera non-icteric, no xanthomas, nares are without discharge. No apparent thyromegaly and/or mass  Lungs: Normal respiratory effort.  no wheezes, no rales, no rhonchi.  Heart: RRR with normal S1 S2. no murmur gallop, no rub, PMI is normal size and placement, carotid upstroke normal without bruit, jugular venous pressure is normal Abdomen: Soft, non-tender, non-distended with normoactive bowel sounds. No hepatomegaly. No rebound/guarding. No obvious abdominal masses. Abdominal aorta is normal size without bruit Extremities: No edema. no cyanosis, no clubbing, no ulcers  Peripheral : 2+ bilateral upper extremity pulses, 2+ bilateral femoral pulses, 2+ bilateral dorsal pedal pulse Neuro: Alert and oriented. No facial asymmetry. No focal deficit. Moves all extremities spontaneously. Musculoskeletal: Normal muscle tone without kyphosis Psych:  Responds to questions appropriately with a normal affect.    Assessment: 80 year old male with known coronary artery disease hypertension hyperlipidemia sleep apnea with unstable angina and no current evidence of myocardial infarction  Plan: 1.  Continue heparin aspirin and nitrates for further risk reduction of possible cardiovascular event 2.  Continue beta-blocker for risk reduction and angina 3.  Proceed to cardiac catheterization to assess for coronary anatomy and further treatment thereof is necessary.  Patient understands the risk and benefits of cardiac  catheterization.  This includes a possibility of death stroke heart attack infection bleeding or blood clot.  He is at low risk for conscious sedation  Signed, Corey Skains M.D. [facility] 12/11/2021, 6:29 PM

## 2021-12-11 NOTE — ED Notes (Signed)
Voided in bed. Clean from voiding in the bed. Called wife for patient. Patient talking to wife.

## 2021-12-11 NOTE — Progress Notes (Signed)
Brief hospitalist update note.  This is a nonbillable note.  Please see same-day H&P for full billable details.  Briefly, this is a 80 year old Caucasian male with history significant for COPD, asthma, depression, hypertension, hyperlipidemia, coronary artery disease treated medically who presents to the ED for evaluation of chest pain.  He is followed by [facility] clinic cardiology.  Last catheterization was in 2017.  At that time 50% stenosis of proximal LAD was noted.  Here in ED high-sensitivity troponin is flat.  I have notified [facility] clinic cardiologists patient admission.  His evaluation is pending.  Ralene Muskrat MD  No charge

## 2021-12-11 NOTE — Progress Notes (Signed)
PHARMACIST - PHYSICIAN ORDER COMMUNICATION  CONCERNING: P&T Medication Policy on Herbal Medications  DESCRIPTION:  This patients order for:  glucosamine-chondroitin 500-400 MG tablet 1 tablet  has been noted.  This product(s) is classified as an herbal or natural product. Due to a lack of definitive safety studies or FDA approval, nonstandard manufacturing practices, plus the potential risk of unknown drug-drug interactions while on inpatient medications, the Pharmacy and Therapeutics Committee does not permit the use of herbal or natural products of this type within [facility].   ACTION TAKEN: The pharmacy department is unable to verify this order at this time. Please reevaluate patients clinical condition at discharge and address if the herbal or natural product(s) should be resumed at that time.   Renda Rolls, PharmD, [facility] 12/11/2021 5:17 AM

## 2021-12-11 NOTE — Progress Notes (Signed)
Patient was heard yelling from across the hall while this RN was in another room. Patient was irritable and agitated, yelling expletives at nursing staff. MD Hal Hope notified and MD came to assess and talk with the patient. Patient initially wanted to leave AMA, but after calming down some and talking with staff, patient agreeable to stay after receiving medication for sleep. Patient discussed some stressful events at home with nursing staff. Heparin gtt stopped at 2123 and IV access was removed in anticipation of AMA discharge. Sleeping medication given and patient agreeable to IV replacement. No further needs/concerns at this time.   Earleen Reaper, RN

## 2021-12-11 NOTE — H&P (Signed)
[place]   PATIENT NAME: Brett Blake    MR#:  [id number]  DATE OF BIRTH:  04-25-1941  DATE OF ADMISSION:  12/11/2021  PRIMARY CARE PHYSICIAN: [name], MD   Patient is coming from: Home  REQUESTING/REFERRING PHYSICIAN: Hinda Kehr, MD  CHIEF COMPLAINT:  No chief complaint on file.   HISTORY OF PRESENT ILLNESS:  Brett Blake is a 80 y.o. Caucasian male with medical history significant for COPD, asthma, depression, type 2 diabetes mellitus, hypertension, dyslipidemia and sleep apnea, who presented to the ER with acute onset of central chest pain felt as pressure and graded 4/10 in severity with associated diaphoresis, dyspnea and palpitations without nausea or vomiting.  He denies any cough or wheezing or dyspnea.  No fever or chills.  No headache or dizziness or blurred vision.  No bleeding diathesis.   ED Course: When he came to the ER blood pressure was 156/92 with a respiratory rate of 20 and heart rate of 132 and otherwise normal vital signs.  Labs revealed hypokalemia of 3.1 and calcium of 8.7.  High-sensitivity troponin I was 12 and later 13 and 16.  CBC showed mild anemia EKG as reviewed by me : EKG showed sinus  tachycardia of 108 with poor R wave progression and suboptimal EKG overall. Imaging: Chest x-ray showed no acute cardiopulmonary disease.  Chest CTA showed mild coronary artery calcification with no pulmonary embolism.  The patient was given 1 L bolus of IV lactated Ringer, initial Nitropaste and was started on IV heparin with bolus and drip.  He will be admitted to a progressive unit observation bed for further evaluation and management. PAST MEDICAL HISTORY:   Past Medical History:  Diagnosis Date   Anxiety    Arthritis    Asthma    Colon polyps    COPD (chronic obstructive pulmonary disease) ([place])    Depression    Diabetes ([place])    Difficult intubation    GERD (gastroesophageal reflux disease)    High cholesterol    Hyperlipidemia     Hypertension    Sleep apnea     PAST SURGICAL HISTORY:   Past Surgical History:  Procedure Laterality Date   BRONCHOSCOPY     CARDIAC CATHETERIZATION Left 11/04/2016   Procedure: Left Heart Cath and Coronary Angiography;  Surgeon: Teodoro Spray, MD;  Location: [facility];  Service: Cardiovascular;  Laterality: Left;   CARDIAC CATHETERIZATION     CHOLECYSTECTOMY N/A 05/09/2019   Procedure: LAPAROSCOPIC CHOLECYSTECTOMY WITH INTRAOPERATIVE CHOLANGIOGRAM;  Surgeon: Jules Husbands, MD;  Location: [facility];  Service: General;  Laterality: N/A;   COLOSTOMY     ROTATOR CUFF REPAIR Left    THUMB ARTHROSCOPY  2015   TONSILLECTOMY  1952   TONSILLECTOMY      SOCIAL HISTORY:   Social History   Tobacco Use   Smoking status: Former    Packs/day: 1.00    Years: 15.00    Pack years: 15.00    Types: Cigarettes   Smokeless tobacco: Never   Tobacco comments:    quit 1977  Substance Use Topics   Alcohol use: Yes    Alcohol/week: 1.0 - 2.0 standard drink    Types: 1 - 2 Shots of liquor per week    FAMILY HISTORY:   Family History  Problem Relation Age of Onset   Heart attack Mother    Prostate cancer Father    Leukemia Brother    Prostate cancer Brother  DRUG ALLERGIES:   Allergies  Allergen Reactions   Ace Inhibitors Cough   Metoprolol Diarrhea   Sulfa Antibiotics Rash    REVIEW OF SYSTEMS:   ROS As per history of present illness. All pertinent systems were reviewed above. Constitutional, HEENT, cardiovascular, respiratory, GI, GU, musculoskeletal, neuro, psychiatric, endocrine, integumentary and hematologic systems were reviewed and are otherwise negative/unremarkable except for positive findings mentioned above in the HPI.   MEDICATIONS AT HOME:   Prior to Admission medications   Medication Sig Start Date End Date Taking? Authorizing Provider  albuterol (ACCUNEB) 1.25 MG/3ML nebulizer solution TAKE 3 MLS (1.25 MG TOTAL) BY NEBULIZATION EVERY 6 (SIX)  HOURS AS NEEDED FOR WHEEZING 04/08/19  Yes [provider]  ALPRAZolam (XANAX) 0.5 MG tablet Take 0.5 tablets by mouth daily as needed. 03/05/19  Yes [provider]  aspirin 81 MG chewable tablet Chew 81 mg by mouth daily.   Yes [provider]  bisoprolol (ZEBETA) 5 MG tablet Take 2.5 mg by mouth 2 (two) times daily. 07/02/20  Yes [provider]  diltiazem (CARDIZEM CD) 240 MG 24 hr capsule Take 240 mg by mouth every morning. 12/07/21  Yes [provider]  fluticasone (FLONASE) 50 MCG/ACT nasal spray Place 1 spray into both nostrils daily as needed for allergies or rhinitis.   Yes [provider]  glucosamine-chondroitin 500-400 MG tablet Take 1 tablet by mouth daily.   Yes [provider]  ibuprofen (ADVIL,MOTRIN) 200 MG tablet Take 200 mg by mouth every 6 (six) hours as needed.   Yes [provider]  isosorbide mononitrate (IMDUR) 30 MG 24 hr tablet Take 30 mg by mouth daily. 06/16/20  Yes [provider]  meclizine (ANTIVERT) 25 MG tablet Take 25 mg by mouth at bedtime. 11/17/21  Yes [provider]  montelukast (SINGULAIR) 10 MG tablet Take 10 mg by mouth at bedtime.   Yes [provider]  Multiple Vitamins-Minerals (MULTIVITAMIN PO) Take 1 tablet by mouth daily.   Yes [provider]  omeprazole (PRILOSEC) 40 MG capsule Take 40 mg by mouth daily. 06/05/20  Yes [provider]  propranolol (INDERAL) 40 MG tablet Take 40 mg by mouth 2 (two) times daily. 12/05/21  Yes [provider]  rosuvastatin (CRESTOR) 5 MG tablet Take 5 mg by mouth daily. 12/06/21  Yes [provider]  SYMBICORT 80-4.5 MCG/ACT inhaler Inhale 2 puffs into the lungs 2 (two) times daily as needed.  05/19/20  Yes [provider]  triamterene-hydrochlorothiazide (DYAZIDE) 37.5-25 MG per capsule Take 1 capsule by mouth daily.   Yes [provider]  venlafaxine XR (EFFEXOR-XR) 37.5 MG  24 hr capsule Take 37.5 mg by mouth daily. 12/07/21  Yes [provider]  amitriptyline (ELAVIL) 10 MG tablet Take 10 mg by mouth at bedtime. Patient not taking: Reported on 12/11/2021 07/02/20   [provider]  donepezil (ARICEPT) 5 MG tablet Take 5 mg by mouth at bedtime.  05/14/20 05/14/21  [provider]  nitroGLYCERIN (NITROSTAT) 0.4 MG SL tablet Place 0.4 mg under the tongue every 5 (five) minutes as needed for chest pain.    [provider]  primidone (MYSOLINE) 50 MG tablet Take 50 mg by mouth daily. Patient not taking: Reported on 12/11/2021 04/22/21   [provider]      VITAL SIGNS:  Blood pressure (!) 154/93, pulse (!) 113, temperature 98 F (36.7 C), temperature source Oral, resp. rate (!) 25, height 5\' 4"  (1.626 m), weight 77.3  kg, SpO2 98 %.  PHYSICAL EXAMINATION:  Physical Exam  GENERAL:  80 y.o.-year-old Caucasian male patient lying in the bed with no acute distress.  EYES: Pupils equal, round, reactive to light and accommodation. No scleral icterus. Extraocular muscles intact.  HEENT: Head atraumatic, normocephalic. Oropharynx and nasopharynx clear.  NECK:  Supple, no jugular venous distention. No thyroid enlargement, no tenderness.  LUNGS: Normal breath sounds bilaterally, no wheezing, rales,rhonchi or crepitation. No use of accessory muscles of respiration.  CARDIOVASCULAR: Regular rate and rhythm, S1, S2 normal. No murmurs, rubs, or gallops.  ABDOMEN: Soft, nondistended, nontender. Bowel sounds present. No organomegaly or mass.  EXTREMITIES: No pedal edema, cyanosis, or clubbing.  NEUROLOGIC: Cranial nerves II through XII are intact. Muscle strength 5/5 in all extremities. Sensation intact. Gait not checked.  PSYCHIATRIC: The patient is alert and oriented x 3.  Normal affect and good eye contact. SKIN: No obvious rash, lesion, or ulcer.   LABORATORY PANEL:   CBC Recent Labs  Lab 12/10/21 1916  WBC 3.9*  HGB 12.4*   HCT 35.7*  PLT 202   ------------------------------------------------------------------------------------------------------------------  Chemistries  Recent Labs  Lab 12/10/21 1916  NA 136  K 3.1*  CL 97*  CO2 29  GLUCOSE 117*  BUN 10  CREATININE 0.88  CALCIUM 8.7*  AST 38  ALT 30  ALKPHOS 92  BILITOT 0.6   ------------------------------------------------------------------------------------------------------------------  Cardiac Enzymes No results for input(s): TROPONINI in the last 168 hours. ------------------------------------------------------------------------------------------------------------------  RADIOLOGY:  DG Chest 1 View  Result Date: 12/10/2021 CLINICAL DATA:  Chest pain EXAM: CHEST  1 VIEW COMPARISON:  05/19/2021 FINDINGS: The heart size and mediastinal contours are within normal limits. Both lungs are clear save for stable granuloma at the left lung base. The visualized skeletal structures are unremarkable. IMPRESSION: No active disease. Electronically Signed   By: Fidela Salisbury M.D.   On: 12/10/2021 19:41   CT Angio Chest PE W and/or Wo Contrast  Result Date: 12/11/2021 CLINICAL DATA:  Pulmonary embolism (PE) suspected, high prob, chest pain, dizziness EXAM: CT ANGIOGRAPHY CHEST WITH CONTRAST TECHNIQUE: Multidetector CT imaging of the chest was performed using the standard protocol during bolus administration of intravenous contrast. Multiplanar CT image reconstructions and MIPs were obtained to evaluate the vascular anatomy. CONTRAST:  122mL OMNIPAQUE IOHEXOL 350 MG/ML SOLN COMPARISON:  None. FINDINGS: Cardiovascular: Adequate opacification of the pulmonary arterial tree. No intraluminal filling defect identified to suggest acute pulmonary embolism. Central pulmonary arteries are of normal caliber. Mild coronary artery calcification. Cardiac size within normal limits. No pericardial effusion. No significant atherosclerotic calcification within the thoracic  aorta. No aortic aneurysm. Mediastinum/Nodes: No enlarged mediastinal, hilar, or axillary lymph nodes. Thyroid gland, trachea, and esophagus demonstrate no significant findings. Lungs/Pleura: Lungs are clear save for a benign calcified granuloma within the left lower lobe. No pleural effusion or pneumothorax. Upper Abdomen: Cholecystectomy has been performed. Musculoskeletal: Degenerative changes are seen within the thoracic spine. No lytic or blastic bone lesion. Review of the MIP images confirms the above findings. IMPRESSION: No pulmonary embolism. Mild coronary artery calcification. Electronically Signed   By: Fidela Salisbury M.D.   On: 12/11/2021 02:28      IMPRESSION AND PLAN:  Principal Problem:   Unstable angina ([place])  1.  Chest pain concerning for unstable angina. - The patient will be admitted to an observation PCU bed. - We will follow serial troponins. - We will continue him on IV heparin for now. - He will be placed on aspirin as well as  as needed sublingual nitroglycerin and morphine sulfate for pain. - We will continue beta-blocker therapy with Zebeta. - Cardiology consult will be obtained. - I notified Dr. Nehemiah Massed about the patient.  2.  COPD/asthma. - We will continue Singulair.  3.  Dyslipidemia. - Continue statin therapy.  4.  Depression. - We will continue Effexor XR and Zoloft.  5.  Type 2 diabetes mellitus. - We will place the patient on supplement coverage with NovoLog.    DVT prophylaxis: IV heparin.   Code Status: full code. Family Communication:  The plan of care was discussed in details with the patient (and family). I answered all questions. The patient agreed to proceed with the above mentioned plan. Further management will depend upon hospital course. Disposition Plan: Back to previous home environment Consults called: Cardiology. All the records are reviewed and case discussed with ED provider.  Status is: Observation   Dispo: The patient is  from: Home              Anticipated d/c is to: Home              Patient currently is not medically stable to d/c.              Difficult to place patient: No      Christel Mormon M.D on 12/11/2021 at 4:13 AM  [facility]   From 7 PM-7 AM, contact night-coverage [email or web address]  CC: Primary care physician; [name], MD

## 2021-12-12 DIAGNOSIS — Z7982 Long term (current) use of aspirin: Secondary | ICD-10-CM | POA: Diagnosis not present

## 2021-12-12 DIAGNOSIS — Z20822 Contact with and (suspected) exposure to covid-19: Secondary | ICD-10-CM | POA: Diagnosis present

## 2021-12-12 DIAGNOSIS — Z87891 Personal history of nicotine dependence: Secondary | ICD-10-CM | POA: Diagnosis not present

## 2021-12-12 DIAGNOSIS — Z806 Family history of leukemia: Secondary | ICD-10-CM | POA: Diagnosis not present

## 2021-12-12 DIAGNOSIS — E876 Hypokalemia: Secondary | ICD-10-CM | POA: Diagnosis present

## 2021-12-12 DIAGNOSIS — J449 Chronic obstructive pulmonary disease, unspecified: Secondary | ICD-10-CM | POA: Diagnosis present

## 2021-12-12 DIAGNOSIS — I25110 Atherosclerotic heart disease of native coronary artery with unstable angina pectoris: Secondary | ICD-10-CM | POA: Diagnosis present

## 2021-12-12 DIAGNOSIS — F32A Depression, unspecified: Secondary | ICD-10-CM | POA: Diagnosis present

## 2021-12-12 DIAGNOSIS — Z8249 Family history of ischemic heart disease and other diseases of the circulatory system: Secondary | ICD-10-CM | POA: Diagnosis not present

## 2021-12-12 DIAGNOSIS — Z79899 Other long term (current) drug therapy: Secondary | ICD-10-CM | POA: Diagnosis not present

## 2021-12-12 DIAGNOSIS — G4733 Obstructive sleep apnea (adult) (pediatric): Secondary | ICD-10-CM | POA: Diagnosis present

## 2021-12-12 DIAGNOSIS — Z888 Allergy status to other drugs, medicaments and biological substances status: Secondary | ICD-10-CM | POA: Diagnosis not present

## 2021-12-12 DIAGNOSIS — E119 Type 2 diabetes mellitus without complications: Secondary | ICD-10-CM | POA: Diagnosis present

## 2021-12-12 DIAGNOSIS — Z8042 Family history of malignant neoplasm of prostate: Secondary | ICD-10-CM | POA: Diagnosis not present

## 2021-12-12 DIAGNOSIS — R079 Chest pain, unspecified: Secondary | ICD-10-CM | POA: Diagnosis present

## 2021-12-12 DIAGNOSIS — F419 Anxiety disorder, unspecified: Secondary | ICD-10-CM | POA: Diagnosis present

## 2021-12-12 DIAGNOSIS — E7800 Pure hypercholesterolemia, unspecified: Secondary | ICD-10-CM | POA: Diagnosis present

## 2021-12-12 DIAGNOSIS — D649 Anemia, unspecified: Secondary | ICD-10-CM | POA: Diagnosis present

## 2021-12-12 DIAGNOSIS — I200 Unstable angina: Secondary | ICD-10-CM | POA: Diagnosis not present

## 2021-12-12 DIAGNOSIS — I10 Essential (primary) hypertension: Secondary | ICD-10-CM | POA: Diagnosis present

## 2021-12-12 DIAGNOSIS — M1990 Unspecified osteoarthritis, unspecified site: Secondary | ICD-10-CM | POA: Diagnosis present

## 2021-12-12 DIAGNOSIS — K219 Gastro-esophageal reflux disease without esophagitis: Secondary | ICD-10-CM | POA: Diagnosis present

## 2021-12-12 DIAGNOSIS — Z882 Allergy status to sulfonamides status: Secondary | ICD-10-CM | POA: Diagnosis not present

## 2021-12-12 DIAGNOSIS — Z8719 Personal history of other diseases of the digestive system: Secondary | ICD-10-CM | POA: Diagnosis not present

## 2021-12-12 LAB — LIPID PANEL
Cholesterol: 180 mg/dL (ref 0–200)
HDL: 113 mg/dL (ref >40)
LDL Cholesterol: 51 mg/dL (ref 0–99)
Total CHOL/HDL Ratio: 1.6 RATIO
Triglycerides: 82 mg/dL (ref <150)
VLDL: 16 mg/dL (ref 0–40)

## 2021-12-12 LAB — GLUCOSE, CAPILLARY
Glucose-Capillary: 125 mg/dL — ABNORMAL HIGH (ref 70–99)
Glucose-Capillary: 184 mg/dL — ABNORMAL HIGH (ref 70–99)
Glucose-Capillary: 119 mg/dL — ABNORMAL HIGH (ref 70–99)
Glucose-Capillary: 244 mg/dL — ABNORMAL HIGH (ref 70–99)

## 2021-12-12 LAB — HEPARIN LEVEL (UNFRACTIONATED)
Heparin Unfractionated: 0.26 IU/mL — ABNORMAL LOW (ref 0.30–0.70)
Heparin Unfractionated: <0.1 IU/mL — ABNORMAL LOW (ref 0.30–0.70)
Heparin Unfractionated: 0.34 IU/mL (ref 0.30–0.70)

## 2021-12-12 LAB — CBC
HCT: 32.5 % — ABNORMAL LOW (ref 39.0–52.0)
Hemoglobin: 11.5 g/dL — ABNORMAL LOW (ref 13.0–17.0)
MCH: 33.8 pg (ref 26.0–34.0)
MCHC: 35.4 g/dL (ref 30.0–36.0)
MCV: 95.6 fL (ref 80.0–100.0)
Platelets: 171 10*3/uL (ref 150–400)
RBC: 3.4 MIL/uL — ABNORMAL LOW (ref 4.22–5.81)
RDW: 14.5 % (ref 11.5–15.5)
WBC: 4.6 10*3/uL (ref 4.0–10.5)
nRBC: 0 % (ref 0.0–0.2)

## 2021-12-12 MED ORDER — ALPRAZOLAM 0.25 MG PO TABS
0.2500 mg | ORAL_TABLET | Freq: Three times a day (TID) | ORAL | Status: DC | PRN
  Administered 2021-12-12 (×2): 0.25 mg via ORAL
  Filled 2021-12-12 (×2): qty 1

## 2021-12-12 MED ORDER — HEPARIN BOLUS VIA INFUSION
2000.0000 [IU] | Freq: Once | INTRAVENOUS | Status: AC
  Administered 2021-12-12: 14:00:00 2000 [IU] via INTRAVENOUS
  Filled 2021-12-12: qty 2000

## 2021-12-12 NOTE — Progress Notes (Signed)
PROGRESS NOTE    Brett Blake  DQQ:[id number] DOB: [birth year] DOA: 12/11/2021 PCP: [name], MD    Brief Narrative:   80 year old Caucasian male with history significant for COPD, asthma, depression, hypertension, hyperlipidemia, coronary artery disease treated medically who presents to the ED for evaluation of chest pain.  He is followed by [facility] cardi[facility].  Last catheterization was in 2017.  At that time 50% stenosis of proximal LAD was noted.  Here in ED high-sensitivity troponin is flat.  I have notified [facility] clinic cardiologists patient admission  For cardiology evaluation decision was made to proceed with inpatient ischemic evaluation.  Patient scheduled for cardiac catheterization on 12/19 to assess coronary anatomy.  He remains on heparin gtt.   Assessment & Plan:   Principal Problem:   Unstable angina ([facility])  Chest pain Unstable angina History of coronary disease Patient presented with chest pain.  He has a significant cardiac history.  Cardiology consulted on admission. Plan: Continue IV heparin Daily aspirin Beta-blocker Cardiology consulted N.p.o. after midnight for left heart catheterization 12/19  COPD No evidence of exacerbation Continue Singulair  Hyperlipidemia PTA statin  Depression Anxiety Patient reports increased life stressors recently Plan: Effexor XR per home dose PTA Zoloft As needed Xanax  Type 2 diabetes mellitus SSI Carb modified diet   DVT prophylaxis: Heparin GTT Code Status: Full Family Communication: None today Disposition Plan: Status is: Inpatient  Remains inpatient appropriate because: Unstable angina on heparin gtt.  Plan for inpatient cardiac catheterization       Level of care: Progressive  Consultants:  Cardiology-[facility]  Procedures:  Left heart catheterization planned 12/19  Antimicrobials: None   Subjective: Seen and examined.  Upset about recent life stressors.   Otherwise stable.  Objective: Vitals:   12/11/21 2328 12/12/21 0446 12/12/21 0811 12/12/21 1124  BP: (!) 150/74 (!) 145/77 (!) 152/98 127/80  Pulse: 73 73 83 81  Resp: 18 16 18 18   Temp: 97.6 F (36.4 C) 97.7 F (36.5 C) 98.4 F (36.9 C) 97.9 F (36.6 C)  TempSrc: Oral Oral    SpO2: 92% 93% 95% 96%  Weight:      Height:        Intake/Output Summary (Last 24 hours) at 12/12/2021 1155 Last data filed at 12/12/2021 0900 Gross per 24 hour  Intake 465.85 ml  Output 400 ml  Net 65.85 ml   Filed Weights   12/11/21 0300  Weight: 77.3 kg    Examination:  General exam: No acute distress Respiratory system: Clear to auscultation. Respiratory effort normal. Cardiovascular system: S1-S2, RRR, no murmurs, no pedal edema Gastrointestinal system: Soft, NT/ND, normal bowel sounds Central nervous system: Alert and oriented. No focal neurological deficits. Extremities: Symmetric 5 x 5 power. Skin: No rashes, lesions or ulcers Psychiatry: Judgement and insight appear normal. Mood & affect appropriate.     Data Reviewed: I have personally reviewed following labs and imaging studies  CBC: Recent Labs  Lab 12/10/21 1916 12/12/21 0524  WBC 3.9* 4.6  NEUTROABS 2.3  --   HGB 12.4* 11.5*  HCT 35.7* 32.5*  MCV 96.5 95.6  PLT 202 194   Basic Metabolic Panel: Recent Labs  Lab 12/10/21 1916  NA 136  K 3.1*  CL 97*  CO2 29  GLUCOSE 117*  BUN 10  CREATININE 0.88  CALCIUM 8.7*   GFR: Estimated Creatinine Clearance: 63.9 mL/min (by C-G formula based on SCr of 0.88 mg/dL). Liver Function Tests: Recent Labs  Lab 12/10/21 1916  AST 38  ALT 30  ALKPHOS 92  BILITOT 0.6  PROT 7.2  ALBUMIN 3.5   No results for input(s): LIPASE, AMYLASE in the last 168 hours. No results for input(s): AMMONIA in the last 168 hours. Coagulation Profile: Recent Labs  Lab 12/11/21 0430  INR 1.0   Cardiac Enzymes: No results for input(s): CKTOTAL, CKMB, CKMBINDEX, TROPONINI in the last  168 hours. BNP (last 3 results) No results for input(s): PROBNP in the last 8760 hours. HbA1C: No results for input(s): HGBA1C in the last 72 hours. CBG: Recent Labs  Lab 12/11/21 0758 12/11/21 1156 12/11/21 1952 12/12/21 0812 12/12/21 1125  GLUCAP 113* 112* 174* 125* 184*   Lipid Profile: Recent Labs    12/12/21 0524  CHOL 180  HDL 113  LDLCALC 51  TRIG 82  CHOLHDL 1.6   Thyroid Function Tests: No results for input(s): TSH, T4TOTAL, FREET4, T3FREE, THYROIDAB in the last 72 hours. Anemia Panel: No results for input(s): VITAMINB12, FOLATE, FERRITIN, TIBC, IRON, RETICCTPCT in the last 72 hours. Sepsis Labs: No results for input(s): PROCALCITON, LATICACIDVEN in the last 168 hours.  Recent Results (from the past 240 hour(s))  Resp Panel by RT-PCR (Flu A&B, Covid) Nasopharyngeal Swab     Status: None   Collection Time: 12/11/21  4:51 PM   Specimen: Nasopharyngeal Swab; Nasopharyngeal(NP) swabs in vial transport medium  Result Value Ref Range Status   SARS Coronavirus 2 by RT PCR NEGATIVE NEGATIVE Final    Comment: (NOTE) SARS-CoV-2 target nucleic acids are NOT DETECTED.  The SARS-CoV-2 RNA is generally detectable in upper respiratory specimens during the acute phase of infection. The lowest concentration of SARS-CoV-2 viral copies this assay can detect is 138 copies/mL. A negative result does not preclude SARS-Cov-2 infection and should not be used as the sole basis for treatment or other patient management decisions. A negative result may occur with  improper specimen collection/handling, submission of specimen other than nasopharyngeal swab, presence of viral mutation(s) within the areas targeted by this assay, and inadequate number of viral copies(<138 copies/mL). A negative result must be combined with clinical observations, patient history, and epidemiological information. The expected result is Negative.  Fact Sheet for Patients:   EntrepreneurPulse.com.au  Fact Sheet for Healthcare Providers:  IncredibleEmployment.be  This test is no t yet approved or cleared by the [place] FDA and  has been authorized for detection and/or diagnosis of SARS-CoV-2 by FDA under an Emergency Use Authorization (EUA). This EUA will remain  in effect (meaning this test can be used) for the duration of the COVID-19 declaration under Section 564(b)(1) of the Act, 21 U.S.C.section 360bbb-3(b)(1), unless the authorization is terminated  or revoked sooner.       Influenza A by PCR NEGATIVE NEGATIVE Final   Influenza B by PCR NEGATIVE NEGATIVE Final    Comment: (NOTE) The Xpert Xpress SARS-CoV-2/FLU/RSV plus assay is intended as an aid in the diagnosis of influenza from Nasopharyngeal swab specimens and should not be used as a sole basis for treatment. Nasal washings and aspirates are unacceptable for Xpert Xpress SARS-CoV-2/FLU/RSV testing.  Fact Sheet for Patients: EntrepreneurPulse.com.au  Fact Sheet for Healthcare Providers: IncredibleEmployment.be  This test is not yet approved or cleared by the [place] FDA and has been authorized for detection and/or diagnosis of SARS-CoV-2 by FDA under an Emergency Use Authorization (EUA). This EUA will remain in effect (meaning this test can be used) for the duration of the COVID-19 declaration under Section 564(b)(1) of the Act, 21  U.S.C. section 360bbb-3(b)(1), unless the authorization is terminated or revoked.  Performed at [facility], [address], [place], [place] [address]          Radiology Studies: DG Chest 1 View  Result Date: 12/10/2021 CLINICAL DATA:  Chest pain EXAM: CHEST  1 VIEW COMPARISON:  05/19/2021 FINDINGS: The heart size and mediastinal contours are within normal limits. Both lungs are clear save for stable granuloma at the left lung base. The visualized skeletal  structures are unremarkable. IMPRESSION: No active disease. Electronically Signed   By: Fidela Salisbury M.D.   On: 12/10/2021 19:41   CT Angio Chest PE W and/or Wo Contrast  Result Date: 12/11/2021 CLINICAL DATA:  Pulmonary embolism (PE) suspected, high prob, chest pain, dizziness EXAM: CT ANGIOGRAPHY CHEST WITH CONTRAST TECHNIQUE: Multidetector CT imaging of the chest was performed using the standard protocol during bolus administration of intravenous contrast. Multiplanar CT image reconstructions and MIPs were obtained to evaluate the vascular anatomy. CONTRAST:  147mL OMNIPAQUE IOHEXOL 350 MG/ML SOLN COMPARISON:  None. FINDINGS: Cardiovascular: Adequate opacification of the pulmonary arterial tree. No intraluminal filling defect identified to suggest acute pulmonary embolism. Central pulmonary arteries are of normal caliber. Mild coronary artery calcification. Cardiac size within normal limits. No pericardial effusion. No significant atherosclerotic calcification within the thoracic aorta. No aortic aneurysm. Mediastinum/Nodes: No enlarged mediastinal, hilar, or axillary lymph nodes. Thyroid gland, trachea, and esophagus demonstrate no significant findings. Lungs/Pleura: Lungs are clear save for a benign calcified granuloma within the left lower lobe. No pleural effusion or pneumothorax. Upper Abdomen: Cholecystectomy has been performed. Musculoskeletal: Degenerative changes are seen within the thoracic spine. No lytic or blastic bone lesion. Review of the MIP images confirms the above findings. IMPRESSION: No pulmonary embolism. Mild coronary artery calcification. Electronically Signed   By: Fidela Salisbury M.D.   On: 12/11/2021 02:28        Scheduled Meds:  aspirin  81 mg Oral Daily   diltiazem  240 mg Oral q morning   donepezil  5 mg Oral QHS   insulin aspart  0-9 Units Subcutaneous TID AC & HS   isosorbide mononitrate  30 mg Oral Daily   meclizine  25 mg Oral QHS   montelukast  10 mg Oral QHS    multivitamin with minerals   Oral Daily   pantoprazole  40 mg Oral Daily   propranolol  60 mg Oral BID   rosuvastatin  5 mg Oral Daily   sodium chloride flush  3 mL Intravenous Q12H   triamterene-hydrochlorothiazide  1 tablet Oral Daily   venlafaxine XR  37.5 mg Oral Daily   Continuous Infusions:  heparin 1,100 Units/hr (12/12/21 1018)     LOS: 0 days    Time spent: 35 minutes    Sidney Ace, MD [facility]   If 7PM-7AM, please contact night-coverage  12/12/2021, 11:55 AM

## 2021-12-12 NOTE — Progress Notes (Signed)
ANTICOAGULATION CONSULT NOTE   Pharmacy Consult for heparin infusion Indication: unstable angina - bolus + infusion (ACS/STEMI)  Allergies  Allergen Reactions   Ace Inhibitors Cough   Metoprolol Diarrhea   Sulfa Antibiotics Rash    Patient Measurements: Height: 5\' 4"  (162.6 cm) Weight: 77.3 kg (170 lb 6.7 oz) (From PCP visit on 12/07/21) IBW/kg (Calculated) : 59.2 Heparin Dosing Weight: 75 kg  Vital Signs: Temp: 97.9 F (36.6 C) (12/18 1124) Temp Source: Oral (12/18 0446) BP: 127/80 (12/18 1124) Pulse Rate: 81 (12/18 1124)  Labs: Recent Labs    12/10/21 1916 12/10/21 2147 12/11/21 0430 12/11/21 0702 12/11/21 1403 12/11/21 2151 12/12/21 0524 12/12/21 1221  HGB 12.4*  --   --   --   --   --  11.5*  --   HCT 35.7*  --   --   --   --   --  32.5*  --   PLT 202  --   --   --   --   --  171  --   APTT  --   --  26  --   --   --   --   --   LABPROT  --   --  13.2  --   --   --   --   --   INR  --   --  1.0  --   --   --   --   --   HEPARINUNFRC  --   --   --   --    < > <0.10* 0.26* <0.10*  CREATININE 0.88  --   --   --   --   --   --   --   TROPONINIHS 12 13 16 14   --   --   --   --    < > = values in this interval not displayed.     Estimated Creatinine Clearance: 63.9 mL/min (by C-G formula based on SCr of 0.88 mg/dL).   Medical History: Past Medical History:  Diagnosis Date   Anxiety    Arthritis    Asthma    Colon polyps    COPD (chronic obstructive pulmonary disease) ([place])    Depression    Diabetes ([place])    Difficult intubation    GERD (gastroesophageal reflux disease)    High cholesterol    Hyperlipidemia    Hypertension    Sleep apnea     Assessment: Pt is 80 yo male with h/o cardiac cath presenting to ED "for evaluation of acute onset of chest pain and palpitations."  12/17 1403 HL 0.33, 950 units/hr 12/17 2123 RN called, heparin drip stopped @ (pt agitated and wanted to leave AMA and lost IV access) 12/17 2151 HL <0.10,  subtherapeutic 12/17 2330 Heparin drip restarted 12/18 0300 heparin infusion changed to 1100 units/hr.  12/18 0524 HL 0.26. - level drawn early.  12/18 12:21 HL < 0.1. the heparin infusion was stopped for a few hours and restarted later this morning.   Goal of Therapy:  Heparin level 0.3-0.7 units/ml Monitor platelets by anticoagulation protocol: Yes   Plan:  Heparin level is therapeutic. Since heparin level is non-detectable. Will give heparin bolus of 2000 units x 1 and continue at the current rate 1100 units/hr. Recheck heparin level in 8 hours. CBC daily while on heparin.   Eleonore Chiquito, PharmD.  12/12/2021 1:42 PM

## 2021-12-12 NOTE — Progress Notes (Signed)
[facility] Cardiology [facility] Encounter Note  Patient: Brett Blake / Admit Date: 12/11/2021 / Date of Encounter: 12/12/2021, 8:42 AM   Subjective: Patient overall feels well today with no evidence of chest pain this morning.  No evidence of troponin elevation or myocardial infarction or acute coronary syndrome.  Patient does have some atypical anginal symptoms overnight and still is having concerns for cardiovascular disease.  Therefore will continue to treat medically and consider cardiac catheterization  Review of Systems: Positive for: Palpitation shortness of breath Negative for: Vision change, hearing change, syncope, dizziness, nausea, vomiting,diarrhea, bloody stool, stomach pain, cough, congestion, diaphoresis, urinary frequency, urinary pain,skin lesions, skin rashes Others previously listed  Objective: Telemetry: Normal sinus rhythm Physical Exam: Blood pressure (!) 152/98, pulse 83, temperature 98.4 F (36.9 C), resp. rate 18, height 5\' 4"  (1.626 m), weight 77.3 kg, SpO2 95 %. Body mass index is 29.25 kg/m. General: Well developed, well nourished, in no acute distress. Head: Normocephalic, atraumatic, sclera non-icteric, no xanthomas, nares are without discharge. Neck: No apparent masses Lungs: Normal respirations with no wheezes, no rhonchi, no rales , no crackles   Heart: Regular rate and rhythm, normal S1 S2, no murmur, no rub, no gallop, PMI is normal size and placement, carotid upstroke normal without bruit, jugular venous pressure normal Abdomen: Soft, non-tender, non-distended with normoactive bowel sounds. No hepatosplenomegaly. Abdominal aorta is normal size without bruit Extremities: No edema, no clubbing, no cyanosis, no ulcers,  Peripheral: 2+ radial, 2+ femoral, 2+ dorsal pedal pulses Neuro: Alert and oriented. Moves all extremities spontaneously. Psych:  Responds to questions appropriately with a normal affect.   Intake/Output Summary (Last 24 hours)  at 12/12/2021 0842 Last data filed at 12/12/2021 0309 Gross per 24 hour  Intake 1725.85 ml  Output 400 ml  Net 1325.85 ml    Inpatient Medications:   aspirin  81 mg Oral Daily   diltiazem  240 mg Oral q morning   donepezil  5 mg Oral QHS   insulin aspart  0-9 Units Subcutaneous TID AC & HS   isosorbide mononitrate  30 mg Oral Daily   meclizine  25 mg Oral QHS   montelukast  10 mg Oral QHS   multivitamin with minerals   Oral Daily   pantoprazole  40 mg Oral Daily   propranolol  60 mg Oral BID   rosuvastatin  5 mg Oral Daily   sodium chloride flush  3 mL Intravenous Q12H   triamterene-hydrochlorothiazide  1 tablet Oral Daily   venlafaxine XR  37.5 mg Oral Daily   Infusions:   heparin 1,100 Units/hr (12/12/21 0309)    Labs: Recent Labs    12/10/21 1916  NA 136  K 3.1*  CL 97*  CO2 29  GLUCOSE 117*  BUN 10  CREATININE 0.88  CALCIUM 8.7*   Recent Labs    12/10/21 1916  AST 38  ALT 30  ALKPHOS 92  BILITOT 0.6  PROT 7.2  ALBUMIN 3.5   Recent Labs    12/10/21 1916 12/12/21 0524  WBC 3.9* 4.6  NEUTROABS 2.3  --   HGB 12.4* 11.5*  HCT 35.7* 32.5*  MCV 96.5 95.6  PLT 202 171   No results for input(s): CKTOTAL, CKMB, TROPONINI in the last 72 hours. Invalid input(s): POCBNP No results for input(s): HGBA1C in the last 72 hours.   Weights: Filed Weights   12/11/21 0300  Weight: 77.3 kg     Radiology/Studies:  DG Chest 1 View  Result Date: 12/10/2021  CLINICAL DATA:  Chest pain EXAM: CHEST  1 VIEW COMPARISON:  05/19/2021 FINDINGS: The heart size and mediastinal contours are within normal limits. Both lungs are clear save for stable granuloma at the left lung base. The visualized skeletal structures are unremarkable. IMPRESSION: No active disease. Electronically Signed   By: Fidela Salisbury M.D.   On: 12/10/2021 19:41   CT Angio Chest PE W and/or Wo Contrast  Result Date: 12/11/2021 CLINICAL DATA:  Pulmonary embolism (PE) suspected, high prob, chest  pain, dizziness EXAM: CT ANGIOGRAPHY CHEST WITH CONTRAST TECHNIQUE: Multidetector CT imaging of the chest was performed using the standard protocol during bolus administration of intravenous contrast. Multiplanar CT image reconstructions and MIPs were obtained to evaluate the vascular anatomy. CONTRAST:  183mL OMNIPAQUE IOHEXOL 350 MG/ML SOLN COMPARISON:  None. FINDINGS: Cardiovascular: Adequate opacification of the pulmonary arterial tree. No intraluminal filling defect identified to suggest acute pulmonary embolism. Central pulmonary arteries are of normal caliber. Mild coronary artery calcification. Cardiac size within normal limits. No pericardial effusion. No significant atherosclerotic calcification within the thoracic aorta. No aortic aneurysm. Mediastinum/Nodes: No enlarged mediastinal, hilar, or axillary lymph nodes. Thyroid gland, trachea, and esophagus demonstrate no significant findings. Lungs/Pleura: Lungs are clear save for a benign calcified granuloma within the left lower lobe. No pleural effusion or pneumothorax. Upper Abdomen: Cholecystectomy has been performed. Musculoskeletal: Degenerative changes are seen within the thoracic spine. No lytic or blastic bone lesion. Review of the MIP images confirms the above findings. IMPRESSION: No pulmonary embolism. Mild coronary artery calcification. Electronically Signed   By: Fidela Salisbury M.D.   On: 12/11/2021 02:28     Assessment and Recommendation  80 y.o. male with known coronary artery disease sleep apnea hypertension hyperlipidemia with continued episodes of chest discomfort concerning for cardiovascular disease and unstable angina 1.  Continue medication management for further risk reduction of anginal symptoms and myocardial infarction 2.  Proceed to cardiac catheterization to assess coronary anatomy and further treatment thereof is necessary.  Patient understands the risk and benefits of cardiac catheterization.  This includes a possibility  of death stroke heart attack infection bleeding or blood clot.  He is at low risk for conscious sedation.  Cardiac catheterization will occur on Monday  Signed, Serafina Royals M.D. FACC

## 2021-12-12 NOTE — Progress Notes (Addendum)
ANTICOAGULATION CONSULT NOTE   Pharmacy Consult for heparin infusion Indication: unstable angina - bolus + infusion (ACS/STEMI)  Allergies  Allergen Reactions   Ace Inhibitors Cough   Metoprolol Diarrhea   Sulfa Antibiotics Rash    Patient Measurements: Height: 5\' 4"  (162.6 cm) Weight: 77.3 kg (170 lb 6.7 oz) (From PCP visit on 12/07/21) IBW/kg (Calculated) : 59.2 Heparin Dosing Weight: 75 kg  Vital Signs: Temp: 97.6 F (36.4 C) (12/17 2328) Temp Source: Oral (12/17 2328) BP: 150/74 (12/17 2328) Pulse Rate: 73 (12/17 2328)  Labs: Recent Labs    12/10/21 1916 12/10/21 2147 12/11/21 0430 12/11/21 0702 12/11/21 1403 12/11/21 2151  HGB 12.4*  --   --   --   --   --   HCT 35.7*  --   --   --   --   --   PLT 202  --   --   --   --   --   APTT  --   --  26  --   --   --   LABPROT  --   --  13.2  --   --   --   INR  --   --  1.0  --   --   --   HEPARINUNFRC  --   --   --   --  0.33 <0.10*  CREATININE 0.88  --   --   --   --   --   TROPONINIHS 12 13 16 14   --   --      Estimated Creatinine Clearance: 63.9 mL/min (by C-G formula based on SCr of 0.88 mg/dL).   Medical History: Past Medical History:  Diagnosis Date   Anxiety    Arthritis    Asthma    Colon polyps    COPD (chronic obstructive pulmonary disease) ([place])    Depression    Diabetes ([place])    Difficult intubation    GERD (gastroesophageal reflux disease)    High cholesterol    Hyperlipidemia    Hypertension    Sleep apnea     Assessment: Pt is 80 yo male with h/o cardiac cath presenting to ED "for evaluation of acute onset of chest pain and palpitations."  12/17 1403 HL 0.33, 950 units/hr 12/17 2123 RN called, heparin drip stopped @ (pt agitated and wanted to leave AMA and lost IV access) 12/17 2151 HL <0.10, subtherapeutic 12/17 2330 Heparin drip restarted  Goal of Therapy:  Heparin level 0.3-0.7 units/ml Monitor platelets by anticoagulation protocol: Yes   Plan:  Heparin level likely  subtherapeutic @ 950 units/hr Increase infusion to 1100 units/hr Recheck HL in 8 hr after rate change CBC daily while on heparin  Renda Rolls, PharmD, [facility] 12/12/2021 1:21 AM

## 2021-12-12 NOTE — Progress Notes (Signed)
ANTICOAGULATION CONSULT NOTE   Pharmacy Consult for heparin infusion Indication: unstable angina - bolus + infusion (ACS/STEMI)  Allergies  Allergen Reactions   Ace Inhibitors Cough   Metoprolol Diarrhea   Sulfa Antibiotics Rash    Patient Measurements: Height: 5\' 4"  (162.6 cm) Weight: 77.3 kg (170 lb 6.7 oz) (From PCP visit on 12/07/21) IBW/kg (Calculated) : 59.2 Heparin Dosing Weight: 75 kg  Vital Signs: Temp: 97.8 F (36.6 C) (12/18 2041) BP: 125/79 (12/18 2041) Pulse Rate: 72 (12/18 2041)  Labs: Recent Labs    12/10/21 1916 12/10/21 2147 12/11/21 0430 12/11/21 0702 12/11/21 1403 12/12/21 0524 12/12/21 1221 12/12/21 2137  HGB 12.4*  --   --   --   --  11.5*  --   --   HCT 35.7*  --   --   --   --  32.5*  --   --   PLT 202  --   --   --   --  171  --   --   APTT  --   --  26  --   --   --   --   --   LABPROT  --   --  13.2  --   --   --   --   --   INR  --   --  1.0  --   --   --   --   --   HEPARINUNFRC  --   --   --   --    < > 0.26* <0.10* 0.34  CREATININE 0.88  --   --   --   --   --   --   --   TROPONINIHS 12 13 16 14   --   --   --   --    < > = values in this interval not displayed.     Estimated Creatinine Clearance: 63.9 mL/min (by C-G formula based on SCr of 0.88 mg/dL).   Medical History: Past Medical History:  Diagnosis Date   Anxiety    Arthritis    Asthma    Colon polyps    COPD (chronic obstructive pulmonary disease) ([place])    Depression    Diabetes ([place])    Difficult intubation    GERD (gastroesophageal reflux disease)    High cholesterol    Hyperlipidemia    Hypertension    Sleep apnea     Assessment: Pt is 80 yo male with h/o cardiac cath presenting to ED "for evaluation of acute onset of chest pain and palpitations."  12/17 1403 HL 0.33, 950 units/hr 12/17 2123 RN called, heparin drip stopped @ (pt agitated and wanted to leave AMA and lost IV access) 12/17 2151 HL <0.10, subtherapeutic 12/17 2330 Heparin drip  restarted 12/18 0300 heparin infusion changed to 1100 units/hr.  12/18 0524 HL 0.26. - level drawn early.  12/18 1221 HL < 0.1. the heparin infusion was stopped for a few hours and restarted later this morning.  12/18 2137 HL 0.34, therapeutic  Goal of Therapy:  Heparin level 0.3-0.7 units/ml Monitor platelets by anticoagulation protocol: Yes   Plan:  Heparin level is therapeutic. Continue heparin at the current rate 1100 units/hr. Recheck heparin level in 8 hours. CBC daily while on heparin.   Sherilyn Banker, PharmD Clinical Pharmacist  12/12/2021 10:09 PM

## 2021-12-12 NOTE — Progress Notes (Signed)
ANTICOAGULATION CONSULT NOTE   Pharmacy Consult for heparin infusion Indication: unstable angina - bolus + infusion (ACS/STEMI)  Allergies  Allergen Reactions   Ace Inhibitors Cough   Metoprolol Diarrhea   Sulfa Antibiotics Rash    Patient Measurements: Height: 5\' 4"  (162.6 cm) Weight: 77.3 kg (170 lb 6.7 oz) (From PCP visit on 12/07/21) IBW/kg (Calculated) : 59.2 Heparin Dosing Weight: 75 kg  Vital Signs: Temp: 98.4 F (36.9 C) (12/18 0811) Temp Source: Oral (12/18 0446) BP: 152/98 (12/18 0811) Pulse Rate: 83 (12/18 0811)  Labs: Recent Labs    12/10/21 1916 12/10/21 2147 12/11/21 0430 12/11/21 0702 12/11/21 1403 12/11/21 2151 12/12/21 0524  HGB 12.4*  --   --   --   --   --  11.5*  HCT 35.7*  --   --   --   --   --  32.5*  PLT 202  --   --   --   --   --  171  APTT  --   --  26  --   --   --   --   LABPROT  --   --  13.2  --   --   --   --   INR  --   --  1.0  --   --   --   --   HEPARINUNFRC  --   --   --   --  0.33 <0.10* 0.26*  CREATININE 0.88  --   --   --   --   --   --   TROPONINIHS 12 13 16 14   --   --   --      Estimated Creatinine Clearance: 63.9 mL/min (by C-G formula based on SCr of 0.88 mg/dL).   Medical History: Past Medical History:  Diagnosis Date   Anxiety    Arthritis    Asthma    Colon polyps    COPD (chronic obstructive pulmonary disease) ([place])    Depression    Diabetes ([place])    Difficult intubation    GERD (gastroesophageal reflux disease)    High cholesterol    Hyperlipidemia    Hypertension    Sleep apnea     Assessment: Pt is 80 yo male with h/o cardiac cath presenting to ED "for evaluation of acute onset of chest pain and palpitations."  12/17 1403 HL 0.33, 950 units/hr 12/17 2123 RN called, heparin drip stopped @ (pt agitated and wanted to leave AMA and lost IV access) 12/17 2151 HL <0.10, subtherapeutic 12/17 2330 Heparin drip restarted 12/18 0300 heparin infusion changed to 1100 units/hr.  12/18 0524 HL 0.26. -  level drawn early.   Goal of Therapy:  Heparin level 0.3-0.7 units/ml Monitor platelets by anticoagulation protocol: Yes   Plan:  Will order heparin level for 12/18 @ 1200. No change to rate at this time.   Eleonore Chiquito, PharmD.  12/12/2021 10:13 AM

## 2021-12-13 LAB — CBC
HCT: 33.7 % — ABNORMAL LOW (ref 39.0–52.0)
Hemoglobin: 11.7 g/dL — ABNORMAL LOW (ref 13.0–17.0)
MCH: 33.7 pg (ref 26.0–34.0)
MCHC: 34.7 g/dL (ref 30.0–36.0)
MCV: 97.1 fL (ref 80.0–100.0)
Platelets: 186 10*3/uL (ref 150–400)
RBC: 3.47 MIL/uL — ABNORMAL LOW (ref 4.22–5.81)
RDW: 14.5 % (ref 11.5–15.5)
WBC: 4 10*3/uL (ref 4.0–10.5)
nRBC: 0 % (ref 0.0–0.2)

## 2021-12-13 LAB — GLUCOSE, CAPILLARY
Glucose-Capillary: 148 mg/dL — ABNORMAL HIGH (ref 70–99)
Glucose-Capillary: 134 mg/dL — ABNORMAL HIGH (ref 70–99)
Glucose-Capillary: 135 mg/dL — ABNORMAL HIGH (ref 70–99)
Glucose-Capillary: 262 mg/dL — ABNORMAL HIGH (ref 70–99)

## 2021-12-13 LAB — HEPARIN LEVEL (UNFRACTIONATED)
Heparin Unfractionated: 0.14 IU/mL — ABNORMAL LOW (ref 0.30–0.70)
Heparin Unfractionated: 0.62 IU/mL (ref 0.30–0.70)

## 2021-12-13 LAB — HEMOGLOBIN A1C
Hgb A1c MFr Bld: 6 % — ABNORMAL HIGH (ref 4.8–5.6)
Mean Plasma Glucose: 126 mg/dL

## 2021-12-13 MED ORDER — SODIUM CHLORIDE 0.9% FLUSH: 3.0000 mL | INTRAVENOUS | Status: DC | PRN

## 2021-12-13 MED ORDER — SODIUM CHLORIDE 0.9 % WEIGHT BASED INFUSION
3.0000 mL/kg/h | INTRAVENOUS | Status: AC
  Administered 2021-12-14: 07:00:00 3 mL/kg/h via INTRAVENOUS

## 2021-12-13 MED ORDER — ASPIRIN 81 MG PO CHEW
81.0000 mg | CHEWABLE_TABLET | ORAL | Status: AC
  Administered 2021-12-14: 05:00:00 81 mg via ORAL
  Filled 2021-12-13: qty 1

## 2021-12-13 MED ORDER — SODIUM CHLORIDE 0.9 % IV SOLN: 250.0000 mL | INTRAVENOUS | Status: DC | PRN

## 2021-12-13 MED ORDER — SODIUM CHLORIDE 0.9 % WEIGHT BASED INFUSION: 1.0000 mL/kg/h | INTRAVENOUS | Status: DC

## 2021-12-13 MED ORDER — HEPARIN BOLUS VIA INFUSION
2000.0000 [IU] | Freq: Once | INTRAVENOUS | Status: AC
  Administered 2021-12-13: 09:00:00 2000 [IU] via INTRAVENOUS
  Filled 2021-12-13: qty 2000

## 2021-12-13 NOTE — Progress Notes (Addendum)
80 year old male complaining of palpitations on admission. He was diagnosed with Angina. He is currently NPO since midnight 12/18 due to upcoming procedure.Patient is currently on IV Heparin going at 13.5 ml/hr. Left Cath procedure scheduled 12/19 between 2:30 to 3:30 pm today.  Patient was administered morning medications with a small sip of water.  He has a 22 G in his Left Forearm, and a 20 G in his right antecubital which I inserted this morning.  He is AC/HS. He received 1 unit of insulin this morning and 1 unit at noon.   His pre-cath weight is  73.7 kg (162.4 lbs) measured earlier this afternoon.  He is currently alert and oriented. His vitals are stable. He is able to get out of bed with 1 assist and is able to use his cane to walk to the restroom. He is NSR on the monitor. He has ecchymosis on his left hand, but otherwise skin is intact.   He has been calm and cooperative this shift and he has been told about his scheduled procedure. Informed Consent for procedure was signed and dated by patient, witnessed by nurse, and placed in patient's folder.    Report given to nurse at 3:45 pm.

## 2021-12-13 NOTE — Progress Notes (Signed)
°  Transition of Care [facility]) Screening Note   Patient Details  Name: Brett Blake Date of Birth: September 09, 1941   Transition of Care [facility]) CM/SW Contact:    Alberteen Sam, LCSW Phone Number: 12/13/2021, 10:20 AM    Transition of Care Department [facility]) has reviewed patient and no TOC needs have been identified at this time. We will continue to monitor patient advancement through interdisciplinary progression rounds. If new patient transition needs arise, please place a TOC consult.

## 2021-12-13 NOTE — Progress Notes (Signed)
PROGRESS NOTE    Kimm Ungaro  UTM:[id number] DOB: July 03, 1941 DOA: 12/11/2021 PCP: [name], MD    Brief Narrative:   80 year old Caucasian male with history significant for COPD, asthma, depression, hypertension, hyperlipidemia, coronary artery disease treated medically who presents to the ED for evaluation of chest pain.  He is followed by [facility] clinic cardiology.  Last catheterization was in 2017.  At that time 50% stenosis of proximal LAD was noted.  Here in ED high-sensitivity troponin is flat.  I have notified [facility] clinic cardiologists patient admission  For cardiology evaluation decision was made to proceed with inpatient ischemic evaluation.  Patient scheduled for cardiac catheterization on 12/19 to assess coronary anatomy.  He remains on heparin gtt.   Assessment & Plan:   Principal Problem:   Unstable angina (HCC)  Chest pain Unstable angina History of coronary disease Patient presented with chest pain.  He has a significant cardiac history.  Cardiology consulted on admission. Plan: Continue IV heparin Daily aspirin Beta-blocker Cardiology consulted N.p.o. for cath today  COPD No evidence of exacerbation Continue Singulair  Hyperlipidemia PTA statin  Depression Anxiety Patient reports increased life stressors recently Plan: Effexor XR per home dose PTA Zoloft As needed Xanax  Type 2 diabetes mellitus SSI Carb modified diet   DVT prophylaxis: Heparin GTT Code Status: Full Family Communication: None today Disposition Plan: Status is: Inpatient  Remains inpatient appropriate because: Unstable angina on heparin gtt.  Plan for inpatient cardiac catheterization       Level of care: Progressive  Consultants:  Cardiology-[facility]  Procedures:  Left heart catheterization planned 12/19  Antimicrobials: None   Subjective: Seen and examined.  Stable this morning.  Chest pain-free  Objective: Vitals:   12/12/21 2336  12/13/21 0534 12/13/21 0751 12/13/21 1117  BP: (!) 140/95 128/78 (!) 112/92 (!) 118/94  Pulse: 67 66 80 71  Resp: 18 16 18 18   Temp: 98.5 F (36.9 C) 98.1 F (36.7 C) 98.2 F (36.8 C) 98 F (36.7 C)  TempSrc:      SpO2: 97% 96% 99% 98%  Weight:    73.7 kg  Height:    5\' 4"  (1.626 m)    Intake/Output Summary (Last 24 hours) at 12/13/2021 1348 Last data filed at 12/12/2021 1910 Gross per 24 hour  Intake 360 ml  Output --  Net 360 ml   Filed Weights   12/11/21 0300 12/13/21 1117  Weight: 77.3 kg 73.7 kg    Examination:  General exam: No acute distress Respiratory system: Clear to auscultation. Respiratory effort normal. Cardiovascular system: S1-S2, RRR, no murmurs, no pedal edema Gastrointestinal system: Soft, NT/ND, normal bowel sounds Central nervous system: Alert and oriented. No focal neurological deficits. Extremities: Symmetric 5 x 5 power. Skin: No rashes, lesions or ulcers Psychiatry: Judgement and insight appear normal. Mood & affect appropriate.     Data Reviewed: I have personally reviewed following labs and imaging studies  CBC: Recent Labs  Lab 12/10/21 1916 12/12/21 0524 12/13/21 0527  WBC 3.9* 4.6 4.0  NEUTROABS 2.3  --   --   HGB 12.4* 11.5* 11.7*  HCT 35.7* 32.5* 33.7*  MCV 96.5 95.6 97.1  PLT 202 171 568   Basic Metabolic Panel: Recent Labs  Lab 12/10/21 1916  NA 136  K 3.1*  CL 97*  CO2 29  GLUCOSE 117*  BUN 10  CREATININE 0.88  CALCIUM 8.7*   GFR: Estimated Creatinine Clearance: 62.6 mL/min (by C-G formula based on SCr of 0.88  mg/dL). Liver Function Tests: Recent Labs  Lab 12/10/21 1916  AST 38  ALT 30  ALKPHOS 92  BILITOT 0.6  PROT 7.2  ALBUMIN 3.5   No results for input(s): LIPASE, AMYLASE in the last 168 hours. No results for input(s): AMMONIA in the last 168 hours. Coagulation Profile: Recent Labs  Lab 12/11/21 0430  INR 1.0   Cardiac Enzymes: No results for input(s): CKTOTAL, CKMB, CKMBINDEX, TROPONINI in  the last 168 hours. BNP (last 3 results) No results for input(s): PROBNP in the last 8760 hours. HbA1C: Recent Labs    12/11/21 1403  HGBA1C 6.0*   CBG: Recent Labs  Lab 12/12/21 1125 12/12/21 1650 12/12/21 2039 12/13/21 0752 12/13/21 1118  GLUCAP 184* 119* 244* 148* 134*   Lipid Profile: Recent Labs    12/12/21 0524  CHOL 180  HDL 113  LDLCALC 51  TRIG 82  CHOLHDL 1.6   Thyroid Function Tests: No results for input(s): TSH, T4TOTAL, FREET4, T3FREE, THYROIDAB in the last 72 hours. Anemia Panel: No results for input(s): VITAMINB12, FOLATE, FERRITIN, TIBC, IRON, RETICCTPCT in the last 72 hours. Sepsis Labs: No results for input(s): PROCALCITON, LATICACIDVEN in the last 168 hours.  Recent Results (from the past 240 hour(s))  Resp Panel by RT-PCR (Flu A&B, Covid) Nasopharyngeal Swab     Status: None   Collection Time: 12/11/21  4:51 PM   Specimen: Nasopharyngeal Swab; Nasopharyngeal(NP) swabs in vial transport medium  Result Value Ref Range Status   SARS Coronavirus 2 by RT PCR NEGATIVE NEGATIVE Final    Comment: (NOTE) SARS-CoV-2 target nucleic acids are NOT DETECTED.  The SARS-CoV-2 RNA is generally detectable in upper respiratory specimens during the acute phase of infection. The lowest concentration of SARS-CoV-2 viral copies this assay can detect is 138 copies/mL. A negative result does not preclude SARS-Cov-2 infection and should not be used as the sole basis for treatment or other patient management decisions. A negative result may occur with  improper specimen collection/handling, submission of specimen other than nasopharyngeal swab, presence of viral mutation(s) within the areas targeted by this assay, and inadequate number of viral copies(<138 copies/mL). A negative result must be combined with clinical observations, patient history, and epidemiological information. The expected result is Negative.  Fact Sheet for Patients:   EntrepreneurPulse.com.au  Fact Sheet for Healthcare Providers:  IncredibleEmployment.be  This test is no t yet approved or cleared by the [place] FDA and  has been authorized for detection and/or diagnosis of SARS-CoV-2 by FDA under an Emergency Use Authorization (EUA). This EUA will remain  in effect (meaning this test can be used) for the duration of the COVID-19 declaration under Section 564(b)(1) of the Act, 21 U.S.C.section 360bbb-3(b)(1), unless the authorization is terminated  or revoked sooner.       Influenza A by PCR NEGATIVE NEGATIVE Final   Influenza B by PCR NEGATIVE NEGATIVE Final    Comment: (NOTE) The Xpert Xpress SARS-CoV-2/FLU/RSV plus assay is intended as an aid in the diagnosis of influenza from Nasopharyngeal swab specimens and should not be used as a sole basis for treatment. Nasal washings and aspirates are unacceptable for Xpert Xpress SARS-CoV-2/FLU/RSV testing.  Fact Sheet for Patients: EntrepreneurPulse.com.au  Fact Sheet for Healthcare Providers: IncredibleEmployment.be  This test is not yet approved or cleared by the [place] FDA and has been authorized for detection and/or diagnosis of SARS-CoV-2 by FDA under an Emergency Use Authorization (EUA). This EUA will remain in effect (meaning this test can be used) for  the duration of the COVID-19 declaration under Section 564(b)(1) of the Act, 21 U.S.C. section 360bbb-3(b)(1), unless the authorization is terminated or revoked.  Performed at [facility], [address] [place], [place] [address]          Radiology Studies: No results found.      Scheduled Meds:  aspirin  81 mg Oral Daily   [START ON 12/14/2021] aspirin  81 mg Oral Pre-Cath   diltiazem  240 mg Oral q morning   donepezil  5 mg Oral QHS   insulin aspart  0-9 Units Subcutaneous TID AC & HS   isosorbide mononitrate  30 mg Oral  Daily   meclizine  25 mg Oral QHS   montelukast  10 mg Oral QHS   multivitamin with minerals   Oral Daily   pantoprazole  40 mg Oral Daily   propranolol  60 mg Oral BID   rosuvastatin  5 mg Oral Daily   sodium chloride flush  3 mL Intravenous Q12H   triamterene-hydrochlorothiazide  1 tablet Oral Daily   venlafaxine XR  37.5 mg Oral Daily   Continuous Infusions:  sodium chloride     [START ON 12/14/2021] sodium chloride     Followed by   Derrill Memo ON 12/14/2021] sodium chloride     heparin 1,350 Units/hr (12/13/21 0912)     LOS: 1 day    Time spent: 15 minutes    Sidney Ace, MD [facility]   If 7PM-7AM, please contact night-coverage  12/13/2021, 1:48 PM

## 2021-12-13 NOTE — Progress Notes (Signed)
[facility] [address] Encounter Note  Patient: Brett Blake / Admit Date: 12/11/2021 / Date of Encounter: 12/13/2021, 4:44 PM   Subjective: Patient overall feels well today with no evidence of chest pain this morning.  No evidence of troponin elevation or myocardial infarction or acute coronary syndrome.  Patient does have some atypical anginal symptoms overnight and still is having concerns for cardiovascular disease.  Therefore will continue to treat medically and consider cardiac catheterization Schedule was not able to accomidate Cath today and will occur in am  Review of Systems: Positive for: Palpitation shortness of breath Negative for: Vision change, hearing change, syncope, dizziness, nausea, vomiting,diarrhea, bloody stool, stomach pain, cough, congestion, diaphoresis, urinary frequency, urinary pain,skin lesions, skin rashes Others previously listed  Objective: Telemetry: Normal sinus rhythm Physical Exam: Blood pressure 133/89, pulse 81, temperature 97.6 F (36.4 C), resp. rate 18, height 5\' 4"  (1.626 m), weight 73.7 kg, SpO2 97 %. Body mass index is 27.89 kg/m. General: Well developed, well nourished, in no acute distress. Head: Normocephalic, atraumatic, sclera non-icteric, no xanthomas, nares are without discharge. Neck: No apparent masses Lungs: Normal respirations with no wheezes, no rhonchi, no rales , no crackles   Heart: Regular rate and rhythm, normal S1 S2, no murmur, no rub, no gallop, PMI is normal size and placement, carotid upstroke normal without bruit, jugular venous pressure normal Abdomen: Soft, non-tender, non-distended with normoactive bowel sounds. No hepatosplenomegaly. Abdominal aorta is normal size without bruit Extremities: No edema, no clubbing, no cyanosis, no ulcers,  Peripheral: 2+ radial, 2+ femoral, 2+ dorsal pedal pulses Neuro: Alert and oriented. Moves all extremities spontaneously. Psych:  Responds to questions appropriately  with a normal affect.   Intake/Output Summary (Last 24 hours) at 12/13/2021 1644 Last data filed at 12/12/2021 1910 Gross per 24 hour  Intake 120 ml  Output --  Net 120 ml     Inpatient Medications:   aspirin  81 mg Oral Daily   [START ON 12/14/2021] aspirin  81 mg Oral Pre-Cath   diltiazem  240 mg Oral q morning   donepezil  5 mg Oral QHS   insulin aspart  0-9 Units Subcutaneous TID AC & HS   isosorbide mononitrate  30 mg Oral Daily   meclizine  25 mg Oral QHS   montelukast  10 mg Oral QHS   multivitamin with minerals   Oral Daily   pantoprazole  40 mg Oral Daily   propranolol  60 mg Oral BID   rosuvastatin  5 mg Oral Daily   sodium chloride flush  3 mL Intravenous Q12H   triamterene-hydrochlorothiazide  1 tablet Oral Daily   venlafaxine XR  37.5 mg Oral Daily   Infusions:   sodium chloride     [START ON 12/14/2021] sodium chloride     Followed by   Derrill Memo ON 12/14/2021] sodium chloride     heparin 1,350 Units/hr (12/13/21 0912)    Labs: Recent Labs    12/10/21 1916  NA 136  K 3.1*  CL 97*  CO2 29  GLUCOSE 117*  BUN 10  CREATININE 0.88  CALCIUM 8.7*    Recent Labs    12/10/21 1916  AST 38  ALT 30  ALKPHOS 92  BILITOT 0.6  PROT 7.2  ALBUMIN 3.5    Recent Labs    12/10/21 1916 12/12/21 0524 12/13/21 0527  WBC 3.9* 4.6 4.0  NEUTROABS 2.3  --   --   HGB 12.4* 11.5* 11.7*  HCT 35.7* 32.5* 33.7*  MCV 96.5 95.6 97.1  PLT 202 171 186    No results for input(s): CKTOTAL, CKMB, TROPONINI in the last 72 hours. Invalid input(s): POCBNP Recent Labs    12/11/21 1403  HGBA1C 6.0*      Weights: Filed Weights   12/11/21 0300 12/13/21 1117  Weight: 77.3 kg 73.7 kg     Radiology/Studies:  DG Chest 1 View  Result Date: 12/10/2021 CLINICAL DATA:  Chest pain EXAM: CHEST  1 VIEW COMPARISON:  05/19/2021 FINDINGS: The heart size and mediastinal contours are within normal limits. Both lungs are clear save for stable granuloma at the left lung  base. The visualized skeletal structures are unremarkable. IMPRESSION: No active disease. Electronically Signed   By: Fidela Salisbury M.D.   On: 12/10/2021 19:41   CT Angio Chest PE W and/or Wo Contrast  Result Date: 12/11/2021 CLINICAL DATA:  Pulmonary embolism (PE) suspected, high prob, chest pain, dizziness EXAM: CT ANGIOGRAPHY CHEST WITH CONTRAST TECHNIQUE: Multidetector CT imaging of the chest was performed using the standard protocol during bolus administration of intravenous contrast. Multiplanar CT image reconstructions and MIPs were obtained to evaluate the vascular anatomy. CONTRAST:  17mL OMNIPAQUE IOHEXOL 350 MG/ML SOLN COMPARISON:  None. FINDINGS: Cardiovascular: Adequate opacification of the pulmonary arterial tree. No intraluminal filling defect identified to suggest acute pulmonary embolism. Central pulmonary arteries are of normal caliber. Mild coronary artery calcification. Cardiac size within normal limits. No pericardial effusion. No significant atherosclerotic calcification within the thoracic aorta. No aortic aneurysm. Mediastinum/Nodes: No enlarged mediastinal, hilar, or axillary lymph nodes. Thyroid gland, trachea, and esophagus demonstrate no significant findings. Lungs/Pleura: Lungs are clear save for a benign calcified granuloma within the left lower lobe. No pleural effusion or pneumothorax. Upper Abdomen: Cholecystectomy has been performed. Musculoskeletal: Degenerative changes are seen within the thoracic spine. No lytic or blastic bone lesion. Review of the MIP images confirms the above findings. IMPRESSION: No pulmonary embolism. Mild coronary artery calcification. Electronically Signed   By: Fidela Salisbury M.D.   On: 12/11/2021 02:28     Assessment and Recommendation  80 y.o. male with known coronary artery disease sleep apnea hypertension hyperlipidemia with continued episodes of chest discomfort concerning for cardiovascular disease and unstable angina 1.  Continue  medication management for further risk reduction of anginal symptoms and myocardial infarction 2.  Proceed to cardiac catheterization to assess coronary anatomy and further treatment thereof is necessary.  Patient understands the risk and benefits of cardiac catheterization.  This includes a possibility of death stroke heart attack infection bleeding or blood clot.  He is at low risk for conscious sedation.  Cardiac catheterization will occur on Tuesday first case  Signed, Serafina Royals M.D. FACC

## 2021-12-13 NOTE — Progress Notes (Signed)
Patient's CATH procedure will not be performed today. Will be rescheduled for tomorrow. Will inform patient. Will contact doctor regarding putting in an order for nutrition for this patient.

## 2021-12-13 NOTE — Progress Notes (Signed)
ANTICOAGULATION CONSULT NOTE   Pharmacy Consult for heparin infusion Indication: unstable angina - bolus + infusion (ACS/STEMI)  Allergies  Allergen Reactions   Ace Inhibitors Cough   Metoprolol Diarrhea   Sulfa Antibiotics Rash    Patient Measurements: Height: 5\' 4"  (162.6 cm) Weight: 77.3 kg (170 lb 6.7 oz) (From PCP visit on 12/07/21) IBW/kg (Calculated) : 59.2 Heparin Dosing Weight: 75 kg  Vital Signs: Temp: 98.1 F (36.7 C) (12/19 0534) BP: 128/78 (12/19 0534) Pulse Rate: 66 (12/19 0534)  Labs: Recent Labs    12/10/21 1916 12/10/21 2147 12/11/21 0430 12/11/21 0702 12/11/21 1403 12/12/21 0524 12/12/21 1221 12/12/21 2137 12/13/21 0527  HGB 12.4*  --   --   --   --  11.5*  --   --  11.7*  HCT 35.7*  --   --   --   --  32.5*  --   --  33.7*  PLT 202  --   --   --   --  171  --   --  186  APTT  --   --  26  --   --   --   --   --   --   LABPROT  --   --  13.2  --   --   --   --   --   --   INR  --   --  1.0  --   --   --   --   --   --   HEPARINUNFRC  --   --   --   --    < > 0.26* <0.10* 0.34 0.14*  CREATININE 0.88  --   --   --   --   --   --   --   --   TROPONINIHS 12 13 16 14   --   --   --   --   --    < > = values in this interval not displayed.     Estimated Creatinine Clearance: 63.9 mL/min (by C-G formula based on SCr of 0.88 mg/dL).   Medical History: Past Medical History:  Diagnosis Date   Anxiety    Arthritis    Asthma    Colon polyps    COPD (chronic obstructive pulmonary disease) ([place])    Depression    Diabetes ([place])    Difficult intubation    GERD (gastroesophageal reflux disease)    High cholesterol    Hyperlipidemia    Hypertension    Sleep apnea     Assessment: Pt is 80 yo male with h/o  COPD, asthma, depression, hypertension, hyperlipidemia, coronary artery disease with recent cardiac cath (12/19) after presenting to ED "for evaluation of acute onset of chest pain and palpitations." Heparin Dosing Weight: 75 kg  12/17 1403 HL  0.33, 950 units/hr 12/17 2123 RN called, heparin drip stopped @ (pt agitated and wanted to leave AMA and lost IV access) 12/17 2151 HL <0.10, subtherapeutic 12/17 2330 Heparin drip restarted 12/18 0300 heparin infusion changed to 1100 units/hr.  12/18 0524 HL 0.26. - level drawn early.  12/18 1221 HL < 0.1. the heparin infusion was stopped for a few hours and restarted later this morning.  12/18 2137 HL 0.34, therapeutic 1100 un/hr 12/18 0527 HL 0.14; subtherapeutic 1100>1350 un/hr  Goal of Therapy:  Heparin level 0.3-0.7 units/ml Monitor platelets by anticoagulation protocol: Yes   Plan:  Heparin level is subtherapeutic.  Bolus heparin 2000 units and increase infusion  rate 1350 units/hr. Recheck heparin level in 8 hours.  CBC daily while on heparin.   Lorna Dibble, PharmD, [facility] Clinical Pharmacist 12/13/2021 7:34 AM

## 2021-12-13 NOTE — Progress Notes (Signed)
ANTICOAGULATION CONSULT NOTE   Pharmacy Consult for heparin infusion Indication: unstable angina - bolus + infusion (ACS/STEMI)  Allergies  Allergen Reactions   Ace Inhibitors Cough   Metoprolol Diarrhea   Sulfa Antibiotics Rash    Patient Measurements: Height: 5\' 4"  (162.6 cm) Weight: 73.7 kg (162 lb 7.7 oz) (162.46 lbs Pre-cath weight) IBW/kg (Calculated) : 59.2 Heparin Dosing Weight: 75 kg  Vital Signs: Temp: 97.6 F (36.4 C) (12/19 1441) BP: 133/89 (12/19 1441) Pulse Rate: 81 (12/19 1441)  Labs: Recent Labs    12/10/21 1916 12/10/21 2147 12/11/21 0430 12/11/21 0702 12/11/21 1403 12/12/21 0524 12/12/21 1221 12/12/21 2137 12/13/21 0527 12/13/21 1555  HGB 12.4*  --   --   --   --  11.5*  --   --  11.7*  --   HCT 35.7*  --   --   --   --  32.5*  --   --  33.7*  --   PLT 202  --   --   --   --  171  --   --  186  --   APTT  --   --  26  --   --   --   --   --   --   --   LABPROT  --   --  13.2  --   --   --   --   --   --   --   INR  --   --  1.0  --   --   --   --   --   --   --   HEPARINUNFRC  --   --   --   --    < > 0.26*   < > 0.34 0.14* 0.62  CREATININE 0.88  --   --   --   --   --   --   --   --   --   TROPONINIHS 12 13 16 14   --   --   --   --   --   --    < > = values in this interval not displayed.     Estimated Creatinine Clearance: 62.6 mL/min (by C-G formula based on SCr of 0.88 mg/dL).   Medical History: Past Medical History:  Diagnosis Date   Anxiety    Arthritis    Asthma    Colon polyps    COPD (chronic obstructive pulmonary disease) ([place])    Depression    Diabetes ([place])    Difficult intubation    GERD (gastroesophageal reflux disease)    High cholesterol    Hyperlipidemia    Hypertension    Sleep apnea     Assessment: Pt is 80 yo male with h/o  COPD, asthma, depression, hypertension, hyperlipidemia, coronary artery disease with recent cardiac cath (12/19) after presenting to ED "for evaluation of acute onset of chest pain and  palpitations." Heparin Dosing Weight: 75 kg  12/17 1403 HL 0.33, 950 units/hr 12/17 2123 RN called, heparin drip stopped @ (pt agitated and wanted to leave AMA and lost IV access) 12/17 2151 HL <0.10, subtherapeutic 12/17 2330 Heparin drip restarted 12/18 0300 heparin infusion changed to 1100 units/hr.  12/18 0524 HL 0.26. - level drawn early.  12/18 1221 HL < 0.1. the heparin infusion was stopped for a few hours and restarted later this morning.  12/18 2137 HL 0.34, therapeutic 1100 un/hr 12/18 0527 HL 0.14; subtherapeutic 1100>1350 un/hr 12/19 1555  HL 0.62   Goal of Therapy:  Heparin level 0.3-0.7 units/ml Monitor platelets by anticoagulation protocol: Yes   Plan:  Heparin level is therapeutic. Will continue heparin infusion at 1350 units/hr. Recheck heparin level in 8 hours. CBC daily while on heparin.   Eleonore Chiquito, PharmD Clinical Pharmacist 12/13/2021 6:43 PM

## 2021-12-14 ENCOUNTER — Encounter
Admission: EM | Disposition: A | Discharge status: Home / Self Care | Payer: Self-pay | Source: Home / Self Care | Attending: Internal Medicine

## 2021-12-14 ENCOUNTER — Encounter: Payer: Self-pay | Admitting: Internal Medicine

## 2021-12-14 HISTORY — PX: LEFT HEART CATH AND CORONARY ANGIOGRAPHY: CATH118249

## 2021-12-14 LAB — GLUCOSE, CAPILLARY
Glucose-Capillary: 153 mg/dL — ABNORMAL HIGH (ref 70–99)
Glucose-Capillary: 203 mg/dL — ABNORMAL HIGH (ref 70–99)

## 2021-12-14 LAB — CBC
HCT: 34.4 % — ABNORMAL LOW (ref 39.0–52.0)
Hemoglobin: 11.9 g/dL — ABNORMAL LOW (ref 13.0–17.0)
MCH: 33.1 pg (ref 26.0–34.0)
MCHC: 34.6 g/dL (ref 30.0–36.0)
MCV: 95.6 fL (ref 80.0–100.0)
Platelets: 224 10*3/uL (ref 150–400)
RBC: 3.6 MIL/uL — ABNORMAL LOW (ref 4.22–5.81)
RDW: 14.1 % (ref 11.5–15.5)
WBC: 3.9 10*3/uL — ABNORMAL LOW (ref 4.0–10.5)
nRBC: 0 % (ref 0.0–0.2)

## 2021-12-14 LAB — HEPARIN LEVEL (UNFRACTIONATED)
Heparin Unfractionated: 0.53 IU/mL (ref 0.30–0.70)
Heparin Unfractionated: 0.55 IU/mL (ref 0.30–0.70)

## 2021-12-14 SURGERY — LEFT HEART CATH AND CORONARY ANGIOGRAPHY
Anesthesia: Moderate Sedation

## 2021-12-14 MED ORDER — LIDOCAINE HCL (PF) 1 % IJ SOLN
INTRAMUSCULAR | Status: DC | PRN
  Administered 2021-12-14: 2 mL
  Administered 2021-12-14: 10 mL

## 2021-12-14 MED ORDER — HEPARIN (PORCINE) IN NACL 1000-0.9 UT/500ML-% IV SOLN
INTRAVENOUS | Status: DC | PRN
  Administered 2021-12-14 (×2): 500 mL

## 2021-12-14 MED ORDER — MIDAZOLAM HCL 2 MG/2ML IJ SOLN
INTRAMUSCULAR | Status: DC | PRN
  Administered 2021-12-14 (×2): 1 mg via INTRAVENOUS

## 2021-12-14 MED ORDER — ROSUVASTATIN CALCIUM 10 MG PO TABS: 10.0000 mg | ORAL_TABLET | Freq: Every day | ORAL | Status: DC

## 2021-12-14 MED ORDER — MIDAZOLAM HCL 2 MG/2ML IJ SOLN
INTRAMUSCULAR | Status: AC
  Filled 2021-12-14: qty 2

## 2021-12-14 MED ORDER — HYDRALAZINE HCL 20 MG/ML IJ SOLN: 10.0000 mg | INTRAMUSCULAR | Status: DC | PRN

## 2021-12-14 MED ORDER — VERAPAMIL HCL 2.5 MG/ML IV SOLN
INTRAVENOUS | Status: AC
  Filled 2021-12-14: qty 2

## 2021-12-14 MED ORDER — SODIUM CHLORIDE 0.9 % WEIGHT BASED INFUSION: 1.0000 mL/kg/h | INTRAVENOUS | Status: DC

## 2021-12-14 MED ORDER — HEPARIN (PORCINE) IN NACL 1000-0.9 UT/500ML-% IV SOLN
INTRAVENOUS | Status: AC
  Filled 2021-12-14: qty 1000

## 2021-12-14 MED ORDER — FENTANYL CITRATE (PF) 100 MCG/2ML IJ SOLN
INTRAMUSCULAR | Status: DC | PRN
  Administered 2021-12-14 (×2): 25 ug via INTRAVENOUS

## 2021-12-14 MED ORDER — ASPIRIN 81 MG PO CHEW
CHEWABLE_TABLET | ORAL | Status: AC
  Filled 2021-12-14: qty 1

## 2021-12-14 MED ORDER — ACETAMINOPHEN 325 MG PO TABS: 650.0000 mg | ORAL_TABLET | ORAL | Status: DC | PRN

## 2021-12-14 MED ORDER — FENTANYL CITRATE (PF) 100 MCG/2ML IJ SOLN
INTRAMUSCULAR | Status: AC
  Filled 2021-12-14: qty 2

## 2021-12-14 MED ORDER — LIDOCAINE HCL 1 % IJ SOLN
INTRAMUSCULAR | Status: AC
  Filled 2021-12-14: qty 20

## 2021-12-14 MED ORDER — HEPARIN SODIUM (PORCINE) 1000 UNIT/ML IJ SOLN
INTRAMUSCULAR | Status: AC
  Filled 2021-12-14: qty 10

## 2021-12-14 MED ORDER — ONDANSETRON HCL 4 MG/2ML IJ SOLN: 4.0000 mg | Freq: Four times a day (QID) | INTRAMUSCULAR | Status: DC | PRN

## 2021-12-14 MED ORDER — LABETALOL HCL 5 MG/ML IV SOLN: 10.0000 mg | INTRAVENOUS | Status: DC | PRN

## 2021-12-14 MED ORDER — IOHEXOL 300 MG/ML  SOLN
INTRAMUSCULAR | Status: DC | PRN
  Administered 2021-12-14: 08:00:00 60 mL

## 2021-12-14 SURGICAL SUPPLY — 15 items
CATH INFINITI 5FR MULTPACK ANG (CATHETERS) ×2 IMPLANT
DEVICE CLOSURE MYNXGRIP 5F (Vascular Products) ×2 IMPLANT
DRAPE BRACHIAL (DRAPES) ×2 IMPLANT
GLIDESHEATH SLEND SS 6F .021 (SHEATH) ×2 IMPLANT
GUIDEWIRE INQWIRE 1.5J.035X260 (WIRE) IMPLANT
INQWIRE 1.5J .035X260CM (WIRE)
KIT SYRINGE INJ CVI SPIKEX1 (MISCELLANEOUS) ×2 IMPLANT
NDL PERC 18GX7CM (NEEDLE) IMPLANT
NEEDLE PERC 18GX7CM (NEEDLE) ×3 IMPLANT
PACK CARDIAC CATH (CUSTOM PROCEDURE TRAY) ×3 IMPLANT
PROTECTION STATION PRESSURIZED (MISCELLANEOUS) ×3
SET ATX SIMPLICITY (MISCELLANEOUS) ×2 IMPLANT
SHEATH AVANTI 5FR X 11CM (SHEATH) ×2 IMPLANT
STATION PROTECTION PRESSURIZED (MISCELLANEOUS) IMPLANT
WIRE GUIDERIGHT .035X150 (WIRE) ×2 IMPLANT

## 2021-12-14 NOTE — Progress Notes (Signed)
[facility] Encounter Note  Patient: Brett Blake / Admit Date: 12/11/2021 / Date of Encounter: 12/14/2021, 8:23 AM   Subjective: Overall patient has done well since admission with no evidence of acute coronary syndrome and/or myocardial infarction.  EKG has been unchanged.  No evidence of further chest pain since admitted.  Cardiac catheterization showing continued proximal left anterior descending artery atherosclerosis of 55% unchanged from previous cardiac catheterization.  Other arteries are normal  Review of Systems: Positive for: None Negative for: Vision change, hearing change, syncope, dizziness, nausea, vomiting,diarrhea, bloody stool, stomach pain, cough, congestion, diaphoresis, urinary frequency, urinary pain,skin lesions, skin rashes Others previously listed  Objective: Telemetry: Normal sinus rhythm Physical Exam: Blood pressure 137/76, pulse 73, temperature 98.5 F (36.9 C), temperature source Oral, resp. rate 19, height 5\' 4"  (1.626 m), weight 73.7 kg, SpO2 100 %. Body mass index is 27.89 kg/m. General: Well developed, well nourished, in no acute distress. Head: Normocephalic, atraumatic, sclera non-icteric, no xanthomas, nares are without discharge. Neck: No apparent masses Lungs: Normal respirations with no wheezes, no rhonchi, no rales , no crackles   Heart: Regular rate and rhythm, normal S1 S2, no murmur, no rub, no gallop, PMI is normal size and placement, carotid upstroke normal without bruit, jugular venous pressure normal Abdomen: Soft, non-tender, non-distended with normoactive bowel sounds. No hepatosplenomegaly. Abdominal aorta is normal size without bruit Extremities: No edema, no clubbing, no cyanosis, no ulcers,  Peripheral: 2+ radial, 2+ femoral, 2+ dorsal pedal pulses Neuro: Alert and oriented. Moves all extremities spontaneously. Psych:  Responds to questions appropriately with a normal affect.   Intake/Output Summary  (Last 24 hours) at 12/14/2021 0823 Last data filed at 12/13/2021 2142 Gross per 24 hour  Intake 240 ml  Output --  Net 240 ml     Inpatient Medications:   aspirin       [MAR Hold] aspirin  81 mg Oral Daily   [MAR Hold] diltiazem  240 mg Oral q morning   [MAR Hold] donepezil  5 mg Oral QHS   [MAR Hold] insulin aspart  0-9 Units Subcutaneous TID AC & HS   [MAR Hold] isosorbide mononitrate  30 mg Oral Daily   [MAR Hold] meclizine  25 mg Oral QHS   [MAR Hold] montelukast  10 mg Oral QHS   [MAR Hold] multivitamin with minerals   Oral Daily   [MAR Hold] pantoprazole  40 mg Oral Daily   [MAR Hold] propranolol  60 mg Oral BID   [MAR Hold] rosuvastatin  5 mg Oral Daily   [MAR Hold] sodium chloride flush  3 mL Intravenous Q12H   [MAR Hold] triamterene-hydrochlorothiazide  1 tablet Oral Daily   [MAR Hold] venlafaxine XR  37.5 mg Oral Daily   Infusions:   sodium chloride     sodium chloride     heparin Stopped (12/14/21 0721)    Labs: No results for input(s): NA, K, CL, CO2, GLUCOSE, BUN, CREATININE, CALCIUM, MG, PHOS in the last 72 hours.  No results for input(s): AST, ALT, ALKPHOS, BILITOT, PROT, ALBUMIN in the last 72 hours.  Recent Labs    12/13/21 0527 12/14/21 0637  WBC 4.0 3.9*  HGB 11.7* 11.9*  HCT 33.7* 34.4*  MCV 97.1 95.6  PLT 186 224    No results for input(s): CKTOTAL, CKMB, TROPONINI in the last 72 hours. Invalid input(s): POCBNP Recent Labs    12/11/21 1403  HGBA1C 6.0*      Weights: [employer]   12/11/21  0300 12/13/21 1117  Weight: 77.3 kg 73.7 kg     Radiology/Studies:  DG Chest 1 View  Result Date: 12/10/2021 CLINICAL DATA:  Chest pain EXAM: CHEST  1 VIEW COMPARISON:  05/19/2021 FINDINGS: The heart size and mediastinal contours are within normal limits. Both lungs are clear save for stable granuloma at the left lung base. The visualized skeletal structures are unremarkable. IMPRESSION: No active disease. Electronically Signed   By: Fidela Salisbury M.D.   On: 12/10/2021 19:41   CT Angio Chest PE W and/or Wo Contrast  Result Date: 12/11/2021 CLINICAL DATA:  Pulmonary embolism (PE) suspected, high prob, chest pain, dizziness EXAM: CT ANGIOGRAPHY CHEST WITH CONTRAST TECHNIQUE: Multidetector CT imaging of the chest was performed using the standard protocol during bolus administration of intravenous contrast. Multiplanar CT image reconstructions and MIPs were obtained to evaluate the vascular anatomy. CONTRAST:  115mL OMNIPAQUE IOHEXOL 350 MG/ML SOLN COMPARISON:  None. FINDINGS: Cardiovascular: Adequate opacification of the pulmonary arterial tree. No intraluminal filling defect identified to suggest acute pulmonary embolism. Central pulmonary arteries are of normal caliber. Mild coronary artery calcification. Cardiac size within normal limits. No pericardial effusion. No significant atherosclerotic calcification within the thoracic aorta. No aortic aneurysm. Mediastinum/Nodes: No enlarged mediastinal, hilar, or axillary lymph nodes. Thyroid gland, trachea, and esophagus demonstrate no significant findings. Lungs/Pleura: Lungs are clear save for a benign calcified granuloma within the left lower lobe. No pleural effusion or pneumothorax. Upper Abdomen: Cholecystectomy has been performed. Musculoskeletal: Degenerative changes are seen within the thoracic spine. No lytic or blastic bone lesion. Review of the MIP images confirms the above findings. IMPRESSION: No pulmonary embolism. Mild coronary artery calcification. Electronically Signed   By: Fidela Salisbury M.D.   On: 12/11/2021 02:28     Assessment and Recommendation  80 y.o. male with known coronary artery disease sleep apnea hypertension hyperlipidemia with continued episodes of chest discomfort atypical in nature without evidence of myocardial infarction or congestive heart failure or acute coronary syndrome  Cardiac catheterization showing no evidence of progression of 55% stenosis of left  anterior descending artery  1.  Continue medication management for further risk reduction of anginal symptoms and further progression of coronary artery atherosclerosis 2.  High intensity cholesterol therapy 3.  Begin ambulation and follow-up for improvements of symptoms and okay for discharge home from cardiac standpoint with follow-up next week with Dr. Saralyn Pilar Signed, Serafina Royals M.D. FACC

## 2021-12-14 NOTE — Progress Notes (Signed)
Pt dreaming,

## 2021-12-14 NOTE — Discharge Summary (Signed)
Physician Discharge Summary  Brett Blake RDE:[id number] DOB: 20-Feb-1941 DOA: 12/11/2021  PCP: [name], MD  Admit date: 12/11/2021 Discharge date: 12/14/2021  Admitted From: Home Disposition: Home  Recommendations for Outpatient Follow-up:  Follow up with PCP in 1-2 weeks Follow-up with cardiology 1 week, Dr. Saralyn Pilar  Home Health: No Equipment/Devices: None  Discharge Condition: Stable CODE STATUS: Full Diet recommendation: Heart healthy  Brief/Interim Summary:  80 year old Caucasian male with history significant for COPD, asthma, depression, hypertension, hyperlipidemia, coronary artery disease treated medically who presents to the ED for evaluation of chest pain.  He is followed by [facility] clinic cardiology.  Last catheterization was in 2017.  At that time 50% stenosis of proximal LAD was noted.  Here in ED high-sensitivity troponin is flat.  I have notified [facility] clinic cardiologists patient admission   For cardiology evaluation decision was made to proceed with inpatient ischemic evaluation.  Patient underwent cardiac catheterization on 12/20.  No worsening lesions amenable to PCI.  Recommend aggressive medical management.  Post catheterization patient is stable.  Chest pain-free.  Stable for discharge home.  Patient was previously on 2 beta-blockers propranolol and bisoprolol.  Discussed with cardiology.  Elect to discontinue bisoprolol and resume propranolol and have patient follow-up with Dr. Saralyn Pilar in cardiology clinic within 1 week for further medication management and titration.    Discharge Diagnoses:  Principal Problem:   Unstable angina (HCC)  Chest pain Unstable angina History of coronary disease Patient presented with chest pain.  He has a significant cardiac history.  Cardiology consulted on admission.  Underwent cardiac catheterization 12/20.  No worsening of known coronary artery disease.  No PCI.  Recommend aggressive medical  management. Plan: Discharge home.  Resume propranolol and daily aspirin.  Follow-up in cardiology office 1 week.    Discharge Instructions  Discharge Instructions     AMB Referral to Cardiac Rehabilitation - Phase II   Complete by: As directed    Diagnosis: Stable Angina   Diet - low sodium heart healthy   Complete by: As directed    Increase activity slowly   Complete by: As directed       Allergies as of 12/14/2021       Reactions   Ace Inhibitors Cough   Metoprolol Diarrhea   Sulfa Antibiotics Rash        Medication List     STOP taking these medications    amitriptyline 10 MG tablet Commonly known as: ELAVIL   bisoprolol 5 MG tablet Commonly known as: ZEBETA   primidone 50 MG tablet Commonly known as: MYSOLINE       TAKE these medications    albuterol 1.25 MG/3ML nebulizer solution Commonly known as: ACCUNEB TAKE 3 MLS (1.25 MG TOTAL) BY NEBULIZATION EVERY 6 (SIX) HOURS AS NEEDED FOR WHEEZING   ALPRAZolam 0.5 MG tablet Commonly known as: XANAX Take 0.5 tablets by mouth daily as needed.   aspirin 81 MG chewable tablet Chew 81 mg by mouth daily.   diltiazem 240 MG 24 hr capsule Commonly known as: CARDIZEM CD Take 240 mg by mouth every morning.   donepezil 5 MG tablet Commonly known as: ARICEPT Take 5 mg by mouth at bedtime.   fluticasone 50 MCG/ACT nasal spray Commonly known as: FLONASE Place 1 spray into both nostrils daily as needed for allergies or rhinitis.   glucosamine-chondroitin 500-400 MG tablet Take 1 tablet by mouth daily.   ibuprofen 200 MG tablet Commonly known as: ADVIL Take 200 mg by mouth every  6 (six) hours as needed.   isosorbide mononitrate 30 MG 24 hr tablet Commonly known as: IMDUR Take 30 mg by mouth daily.   meclizine 25 MG tablet Commonly known as: ANTIVERT Take 25 mg by mouth at bedtime.   montelukast 10 MG tablet Commonly known as: SINGULAIR Take 10 mg by mouth at bedtime.   MULTIVITAMIN PO Take 1  tablet by mouth daily.   nitroGLYCERIN 0.4 MG SL tablet Commonly known as: NITROSTAT Place 0.4 mg under the tongue every 5 (five) minutes as needed for chest pain.   omeprazole 40 MG capsule Commonly known as: PRILOSEC Take 40 mg by mouth daily.   propranolol 40 MG tablet Commonly known as: INDERAL Take 40 mg by mouth 2 (two) times daily.   rosuvastatin 5 MG tablet Commonly known as: CRESTOR Take 5 mg by mouth daily.   Symbicort 80-4.5 MCG/ACT inhaler Generic drug: budesonide-formoterol Inhale 2 puffs into the lungs 2 (two) times daily as needed.   triamterene-hydrochlorothiazide 37.5-25 MG capsule Commonly known as: DYAZIDE Take 1 capsule by mouth daily.   venlafaxine XR 37.5 MG 24 hr capsule Commonly known as: EFFEXOR-XR Take 37.5 mg by mouth daily.        Follow-up Information     [name], MD. Schedule an appointment as soon as possible for a visit in 1 week(s).   Specialty: Internal Medicine Why: @ Contact information: [place] [address] [phone]         [name], MD. Schedule an appointment as soon as possible for a visit on 12/28/2021.   Specialty: Cardiology Why: @ 10:30am Contact information: [address] [facility] [place] [address] [phone]                Allergies  Allergen Reactions   Ace Inhibitors Cough   Metoprolol Diarrhea   Sulfa Antibiotics Rash    Consultations: Cardiology-[facility]   Procedures/Studies: DG Chest 1 View  Result Date: 12/10/2021 CLINICAL DATA:  Chest pain EXAM: CHEST  1 VIEW COMPARISON:  05/19/2021 FINDINGS: The heart size and mediastinal contours are within normal limits. Both lungs are clear save for stable granuloma at the left lung base. The visualized skeletal structures are unremarkable. IMPRESSION: No active disease. Electronically Signed   By: Fidela Salisbury M.D.   On: 12/10/2021 19:41   CT Angio Chest PE W  and/or Wo Contrast  Result Date: 12/11/2021 CLINICAL DATA:  Pulmonary embolism (PE) suspected, high prob, chest pain, dizziness EXAM: CT ANGIOGRAPHY CHEST WITH CONTRAST TECHNIQUE: Multidetector CT imaging of the chest was performed using the standard protocol during bolus administration of intravenous contrast. Multiplanar CT image reconstructions and MIPs were obtained to evaluate the vascular anatomy. CONTRAST:  149mL OMNIPAQUE IOHEXOL 350 MG/ML SOLN COMPARISON:  None. FINDINGS: Cardiovascular: Adequate opacification of the pulmonary arterial tree. No intraluminal filling defect identified to suggest acute pulmonary embolism. Central pulmonary arteries are of normal caliber. Mild coronary artery calcification. Cardiac size within normal limits. No pericardial effusion. No significant atherosclerotic calcification within the thoracic aorta. No aortic aneurysm. Mediastinum/Nodes: No enlarged mediastinal, hilar, or axillary lymph nodes. Thyroid gland, trachea, and esophagus demonstrate no significant findings. Lungs/Pleura: Lungs are clear save for a benign calcified granuloma within the left lower lobe. No pleural effusion or pneumothorax. Upper Abdomen: Cholecystectomy has been performed. Musculoskeletal: Degenerative changes are seen within the thoracic spine. No lytic or blastic bone lesion. Review of the MIP images confirms the above findings. IMPRESSION: No pulmonary embolism.  Mild coronary artery calcification. Electronically Signed   By: Fidela Salisbury M.D.   On: 12/11/2021 02:28   CARDIAC CATHETERIZATION  Result Date: 12/14/2021   Prox LAD lesion is 55% stenosed.   Prox LAD to Mid LAD lesion is 50% stenosed.   The left ventricular systolic function is normal.   LV end diastolic pressure is normal. 80 year old male with known cardiovascular disease hypertension hyperlipidemia on appropriate medication management of unstable angina with no current evidence of acute coronary syndrome and/or non-ST  elevation myocardial infarction Cardiac catheterization with continued 55% stenosis of proximal left anterior descending artery unchanged from previous cardiac catheterization Plan No further cardiac intervention at this time Single antiplatelet therapy High intensity cholesterol therapy Antianginals for chest pain Hypertension control for goal systolic blood pressure below 130 mm Cardiac rehab      Subjective: Patient seen and examined on the day of discharge.  Stable no distress.  Chest pain-free.  Stable for discharge home  Discharge Exam: Vitals:   12/14/21 0918 12/14/21 1048  BP: 100/71 112/84  Pulse: 72 67  Resp: 14 20  Temp:  98.7 F (37.1 C)  SpO2: 97% 96%   Vitals:   12/14/21 0900 12/14/21 0915 12/14/21 0918 12/14/21 1048  BP: 133/74 (!) 125/108 100/71 112/84  Pulse: 70 70 72 67  Resp: 12 13 14 20   Temp:    98.7 F (37.1 C)  TempSrc:      SpO2: 94% 93% 97% 96%  Weight:      Height:        General: Pt is alert, awake, not in acute distress Cardiovascular: RRR, S1/S2 +, no rubs, no gallops Respiratory: CTA bilaterally, no wheezing, no rhonchi Abdominal: Soft, NT, ND, bowel sounds + Extremities: no edema, no cyanosis    The results of significant diagnostics from this hospitalization (including imaging, microbiology, ancillary and laboratory) are listed below for reference.     Microbiology: Recent Results (from the past 240 hour(s))  Resp Panel by RT-PCR (Flu A&B, Covid) Nasopharyngeal Swab     Status: None   Collection Time: 12/11/21  4:51 PM   Specimen: Nasopharyngeal Swab; Nasopharyngeal(NP) swabs in vial transport medium  Result Value Ref Range Status   SARS Coronavirus 2 by RT PCR NEGATIVE NEGATIVE Final    Comment: (NOTE) SARS-CoV-2 target nucleic acids are NOT DETECTED.  The SARS-CoV-2 RNA is generally detectable in upper respiratory specimens during the acute phase of infection. The lowest concentration of SARS-CoV-2 viral copies this assay can  detect is 138 copies/mL. A negative result does not preclude SARS-Cov-2 infection and should not be used as the sole basis for treatment or other patient management decisions. A negative result may occur with  improper specimen collection/handling, submission of specimen other than nasopharyngeal swab, presence of viral mutation(s) within the areas targeted by this assay, and inadequate number of viral copies(<138 copies/mL). A negative result must be combined with clinical observations, patient history, and epidemiological information. The expected result is Negative.  Fact Sheet for Patients:  EntrepreneurPulse.com.au  Fact Sheet for Healthcare Providers:  IncredibleEmployment.be  This test is no t yet approved or cleared by the [place] FDA and  has been authorized for detection and/or diagnosis of SARS-CoV-2 by FDA under an Emergency Use Authorization (EUA). This EUA will remain  in effect (meaning this test can be used) for the duration of the COVID-19 declaration under Section 564(b)(1) of the Act, 21 U.S.C.section 360bbb-3(b)(1), unless the authorization is terminated  or revoked sooner.  Influenza A by PCR NEGATIVE NEGATIVE Final   Influenza B by PCR NEGATIVE NEGATIVE Final    Comment: (NOTE) The Xpert Xpress SARS-CoV-2/FLU/RSV plus assay is intended as an aid in the diagnosis of influenza from Nasopharyngeal swab specimens and should not be used as a sole basis for treatment. Nasal washings and aspirates are unacceptable for Xpert Xpress SARS-CoV-2/FLU/RSV testing.  Fact Sheet for Patients: EntrepreneurPulse.com.au  Fact Sheet for Healthcare Providers: IncredibleEmployment.be  This test is not yet approved or cleared by the [place] FDA and has been authorized for detection and/or diagnosis of SARS-CoV-2 by FDA under an Emergency Use Authorization (EUA). This EUA will remain in  effect (meaning this test can be used) for the duration of the COVID-19 declaration under Section 564(b)(1) of the Act, 21 U.S.C. section 360bbb-3(b)(1), unless the authorization is terminated or revoked.  Performed at [facility], [address] [place], [place] [address]      Labs: BNP (last 3 results) No results for input(s): BNP in the last 8760 hours. Basic Metabolic Panel: Recent Labs  Lab 12/10/21 1916  NA 136  K 3.1*  CL 97*  CO2 29  GLUCOSE 117*  BUN 10  CREATININE 0.88  CALCIUM 8.7*   Liver Function Tests: Recent Labs  Lab 12/10/21 1916  AST 38  ALT 30  ALKPHOS 92  BILITOT 0.6  PROT 7.2  ALBUMIN 3.5   No results for input(s): LIPASE, AMYLASE in the last 168 hours. No results for input(s): AMMONIA in the last 168 hours. CBC: Recent Labs  Lab 12/10/21 1916 12/12/21 0524 12/13/21 0527 12/14/21 0637  WBC 3.9* 4.6 4.0 3.9*  NEUTROABS 2.3  --   --   --   HGB 12.4* 11.5* 11.7* 11.9*  HCT 35.7* 32.5* 33.7* 34.4*  MCV 96.5 95.6 97.1 95.6  PLT 202 171 186 224   Cardiac Enzymes: No results for input(s): CKTOTAL, CKMB, CKMBINDEX, TROPONINI in the last 168 hours. BNP: Invalid input(s): POCBNP CBG: Recent Labs  Lab 12/13/21 1118 12/13/21 1746 12/13/21 2007 12/14/21 0729 12/14/21 0953  GLUCAP 134* 135* 262* 153* 203*   D-Dimer No results for input(s): DDIMER in the last 72 hours. Hgb A1c No results for input(s): HGBA1C in the last 72 hours. Lipid Profile Recent Labs    12/12/21 0524  CHOL 180  HDL 113  LDLCALC 51  TRIG 82  CHOLHDL 1.6   Thyroid function studies No results for input(s): TSH, T4TOTAL, T3FREE, THYROIDAB in the last 72 hours.  Invalid input(s): FREET3 Anemia work up No results for input(s): VITAMINB12, FOLATE, FERRITIN, TIBC, IRON, RETICCTPCT in the last 72 hours. Urinalysis    Component Value Date/Time   COLORURINE YELLOW (A) 05/20/2021 0029   APPEARANCEUR CLEAR (A) 05/20/2021 0029   LABSPEC 1.016  05/20/2021 0029   PHURINE 5.0 05/20/2021 0029   GLUCOSEU NEGATIVE 05/20/2021 0029   HGBUR LARGE (A) 05/20/2021 0029   BILIRUBINUR NEGATIVE 05/20/2021 0029   KETONESUR NEGATIVE 05/20/2021 0029   PROTEINUR NEGATIVE 05/20/2021 0029   NITRITE NEGATIVE 05/20/2021 0029   LEUKOCYTESUR NEGATIVE 05/20/2021 0029   Sepsis Labs Invalid input(s): PROCALCITONIN,  WBC,  LACTICIDVEN Microbiology Recent Results (from the past 240 hour(s))  Resp Panel by RT-PCR (Flu A&B, Covid) Nasopharyngeal Swab     Status: None   Collection Time: 12/11/21  4:51 PM   Specimen: Nasopharyngeal Swab; Nasopharyngeal(NP) swabs in vial transport medium  Result Value Ref Range Status   SARS Coronavirus 2 by RT PCR NEGATIVE NEGATIVE Final  Comment: (NOTE) SARS-CoV-2 target nucleic acids are NOT DETECTED.  The SARS-CoV-2 RNA is generally detectable in upper respiratory specimens during the acute phase of infection. The lowest concentration of SARS-CoV-2 viral copies this assay can detect is 138 copies/mL. A negative result does not preclude SARS-Cov-2 infection and should not be used as the sole basis for treatment or other patient management decisions. A negative result may occur with  improper specimen collection/handling, submission of specimen other than nasopharyngeal swab, presence of viral mutation(s) within the areas targeted by this assay, and inadequate number of viral copies(<138 copies/mL). A negative result must be combined with clinical observations, patient history, and epidemiological information. The expected result is Negative.  Fact Sheet for Patients:  EntrepreneurPulse.com.au  Fact Sheet for Healthcare Providers:  IncredibleEmployment.be  This test is no t yet approved or cleared by the [place] FDA and  has been authorized for detection and/or diagnosis of SARS-CoV-2 by FDA under an Emergency Use Authorization (EUA). This EUA will remain  in effect  (meaning this test can be used) for the duration of the COVID-19 declaration under Section 564(b)(1) of the Act, 21 U.S.C.section 360bbb-3(b)(1), unless the authorization is terminated  or revoked sooner.       Influenza A by PCR NEGATIVE NEGATIVE Final   Influenza B by PCR NEGATIVE NEGATIVE Final    Comment: (NOTE) The Xpert Xpress SARS-CoV-2/FLU/RSV plus assay is intended as an aid in the diagnosis of influenza from Nasopharyngeal swab specimens and should not be used as a sole basis for treatment. Nasal washings and aspirates are unacceptable for Xpert Xpress SARS-CoV-2/FLU/RSV testing.  Fact Sheet for Patients: EntrepreneurPulse.com.au  Fact Sheet for Healthcare Providers: IncredibleEmployment.be  This test is not yet approved or cleared by the [place] FDA and has been authorized for detection and/or diagnosis of SARS-CoV-2 by FDA under an Emergency Use Authorization (EUA). This EUA will remain in effect (meaning this test can be used) for the duration of the COVID-19 declaration under Section 564(b)(1) of the Act, 21 U.S.C. section 360bbb-3(b)(1), unless the authorization is terminated or revoked.  Performed at [facility], [address] [place], [place] [address]      Time coordinating discharge: Over 30 minutes  SIGNED:   Sidney Ace, MD  [facility] 12/14/2021, 2:26 PM Pager   If 7PM-7AM, please contact night-coverage

## 2021-12-14 NOTE — Progress Notes (Signed)
DC paperwork reviewed with wife at bedside,. Wife to transport patient home via [place]. Vitals WDL at time of DC. All questions answered and belongings packed my patient and wife to take down at time of discharge. Volunteers took patient and wife down to medical mall for DC>

## 2021-12-14 NOTE — Progress Notes (Signed)
ANTICOAGULATION CONSULT NOTE   Pharmacy Consult for heparin infusion Indication: unstable angina - bolus + infusion (ACS/STEMI)  Allergies  Allergen Reactions   Ace Inhibitors Cough   Metoprolol Diarrhea   Sulfa Antibiotics Rash    Patient Measurements: Height: 5\' 4"  (162.6 cm) Weight: 73.7 kg (162 lb 7.7 oz) (162.46 lbs Pre-cath weight) IBW/kg (Calculated) : 59.2 Heparin Dosing Weight: 75 kg  Vital Signs: Temp: 98.5 F (36.9 C) (12/20 0341) Temp Source: Oral (12/20 0341) BP: 100/71 (12/20 0918) Pulse Rate: 72 (12/20 0918)  Labs: Recent Labs    12/12/21 0524 12/12/21 1221 12/13/21 0527 12/13/21 1555 12/13/21 2352 12/14/21 0637  HGB 11.5*  --  11.7*  --   --  11.9*  HCT 32.5*  --  33.7*  --   --  34.4*  PLT 171  --  186  --   --  224  HEPARINUNFRC 0.26*   < > 0.14* 0.62 0.53 0.55   < > = values in this interval not displayed.     Estimated Creatinine Clearance: 62.6 mL/min (by C-G formula based on SCr of 0.88 mg/dL).   Medical History: Past Medical History:  Diagnosis Date   Anxiety    Arthritis    Asthma    Colon polyps    COPD (chronic obstructive pulmonary disease) ([place])    Depression    Diabetes ([place])    Difficult intubation    GERD (gastroesophageal reflux disease)    High cholesterol    Hyperlipidemia    Hypertension    Sleep apnea     Assessment: Pt is 80 yo male with h/o  COPD, asthma, depression, hypertension, hyperlipidemia, coronary artery disease with recent cardiac cath (12/19) after presenting to ED "for evaluation of acute onset of chest pain and palpitations." Heparin Dosing Weight: 75 kg  12/17 1403 HL 0.33, 950 units/hr 12/17 2123 RN called, heparin drip stopped @ (pt agitated and wanted to leave AMA and lost IV access) 12/17 2151 HL <0.10, subtherapeutic 12/17 2330 Heparin drip restarted 12/18 0300 heparin infusion changed to 1100 units/hr.  12/18 0524 HL 0.26. - level drawn early.  12/18 1221 HL < 0.1. the heparin infusion  was stopped for a few hours and restarted later this morning.  12/18 2137 HL 0.34, therapeutic 1100 un/hr 12/18 0527 HL 0.14; subtherapeutic 1100>1350 un/hr 12/19 1555 HL 0.62; therapeutic x 1 12/19 2352 HL 0.53; therapeutic x 2 12/20 0637 HL 0.55; thera x3 @1350  un/hr  Goal of Therapy:  Heparin level 0.3-0.7 units/ml Monitor platelets by anticoagulation protocol: Yes   Plan:  Heparin level is therapeutic. Will continue heparin infusion at 1350 units/hr if continued and f/u [facility] plan with cardiology post-cath.  Recheck heparin level with AM labs if continued. CBC daily while on heparin.   Lorna Dibble, PharmD Clinical Pharmacist  12/14/2021 10:18 AM

## 2021-12-14 NOTE — Progress Notes (Signed)
ANTICOAGULATION CONSULT NOTE   Pharmacy Consult for heparin infusion Indication: unstable angina - bolus + infusion (ACS/STEMI)  Allergies  Allergen Reactions   Ace Inhibitors Cough   Metoprolol Diarrhea   Sulfa Antibiotics Rash    Patient Measurements: Height: 5\' 4"  (162.6 cm) Weight: 73.7 kg (162 lb 7.7 oz) (162.46 lbs Pre-cath weight) IBW/kg (Calculated) : 59.2 Heparin Dosing Weight: 75 kg  Vital Signs: Temp: 98.2 F (36.8 C) (12/19 2330) Temp Source: Oral (12/19 2330) BP: 117/73 (12/19 2330) Pulse Rate: 77 (12/19 2330)  Labs: Recent Labs    12/11/21 0430 12/11/21 0702 12/11/21 1403 12/12/21 0524 12/12/21 1221 12/13/21 0527 12/13/21 1555 12/13/21 2352  HGB  --   --   --  11.5*  --  11.7*  --   --   HCT  --   --   --  32.5*  --  33.7*  --   --   PLT  --   --   --  171  --  186  --   --   APTT 26  --   --   --   --   --   --   --   LABPROT 13.2  --   --   --   --   --   --   --   INR 1.0  --   --   --   --   --   --   --   HEPARINUNFRC  --   --    < > 0.26*   < > 0.14* 0.62 0.53  TROPONINIHS 16 14  --   --   --   --   --   --    < > = values in this interval not displayed.     Estimated Creatinine Clearance: 62.6 mL/min (by C-G formula based on SCr of 0.88 mg/dL).   Medical History: Past Medical History:  Diagnosis Date   Anxiety    Arthritis    Asthma    Colon polyps    COPD (chronic obstructive pulmonary disease) ([place])    Depression    Diabetes ([place])    Difficult intubation    GERD (gastroesophageal reflux disease)    High cholesterol    Hyperlipidemia    Hypertension    Sleep apnea     Assessment: Pt is 80 yo male with h/o  COPD, asthma, depression, hypertension, hyperlipidemia, coronary artery disease with recent cardiac cath (12/19) after presenting to ED "for evaluation of acute onset of chest pain and palpitations." Heparin Dosing Weight: 75 kg  12/17 1403 HL 0.33, 950 units/hr 12/17 2123 RN called, heparin drip stopped @ (pt agitated  and wanted to leave AMA and lost IV access) 12/17 2151 HL <0.10, subtherapeutic 12/17 2330 Heparin drip restarted 12/18 0300 heparin infusion changed to 1100 units/hr.  12/18 0524 HL 0.26. - level drawn early.  12/18 1221 HL < 0.1. the heparin infusion was stopped for a few hours and restarted later this morning.  12/18 2137 HL 0.34, therapeutic 1100 un/hr 12/18 0527 HL 0.14; subtherapeutic 1100>1350 un/hr 12/19 1555 HL 0.62; therapeutic x 1 12/20 2352 HL 0.53; therapeutic x 2  Goal of Therapy:  Heparin level 0.3-0.7 units/ml Monitor platelets by anticoagulation protocol: Yes   Plan:  Heparin level is therapeutic. Will continue heparin infusion at 1350 units/hr. Recheck heparin level with AM labs. CBC daily while on heparin.   Sherilyn Banker, PharmD Clinical Pharmacist  12/14/2021 12:40 AM

## 2022-02-14 ENCOUNTER — Inpatient Hospital Stay
Admission: EM | Admit: 2022-02-14 | Discharge: 2022-02-28 | DRG: 896 | Disposition: A | Discharge status: Home Health | Payer: Medicare Other | Attending: Internal Medicine | Admitting: Internal Medicine

## 2022-02-14 ENCOUNTER — Emergency Department: Payer: Medicare Other

## 2022-02-14 ENCOUNTER — Other Ambulatory Visit: Payer: Self-pay

## 2022-02-14 DIAGNOSIS — F1029 Alcohol dependence with unspecified alcohol-induced disorder: Secondary | ICD-10-CM | POA: Diagnosis present

## 2022-02-14 DIAGNOSIS — R7401 Elevation of levels of liver transaminase levels: Secondary | ICD-10-CM | POA: Diagnosis present

## 2022-02-14 DIAGNOSIS — Z87891 Personal history of nicotine dependence: Secondary | ICD-10-CM | POA: Diagnosis not present

## 2022-02-14 DIAGNOSIS — R079 Chest pain, unspecified: Secondary | ICD-10-CM | POA: Diagnosis present

## 2022-02-14 DIAGNOSIS — E872 Acidosis, unspecified: Principal | ICD-10-CM

## 2022-02-14 DIAGNOSIS — Z8249 Family history of ischemic heart disease and other diseases of the circulatory system: Secondary | ICD-10-CM | POA: Diagnosis not present

## 2022-02-14 DIAGNOSIS — Z882 Allergy status to sulfonamides status: Secondary | ICD-10-CM

## 2022-02-14 DIAGNOSIS — R569 Unspecified convulsions: Secondary | ICD-10-CM | POA: Diagnosis present

## 2022-02-14 DIAGNOSIS — E876 Hypokalemia: Secondary | ICD-10-CM

## 2022-02-14 DIAGNOSIS — Z20822 Contact with and (suspected) exposure to covid-19: Secondary | ICD-10-CM | POA: Diagnosis present

## 2022-02-14 DIAGNOSIS — E669 Obesity, unspecified: Secondary | ICD-10-CM

## 2022-02-14 DIAGNOSIS — F10931 Alcohol use, unspecified with withdrawal delirium: Secondary | ICD-10-CM | POA: Diagnosis present

## 2022-02-14 DIAGNOSIS — I10 Essential (primary) hypertension: Secondary | ICD-10-CM | POA: Diagnosis present

## 2022-02-14 DIAGNOSIS — G4730 Sleep apnea, unspecified: Secondary | ICD-10-CM | POA: Diagnosis present

## 2022-02-14 DIAGNOSIS — F1010 Alcohol abuse, uncomplicated: Secondary | ICD-10-CM | POA: Diagnosis present

## 2022-02-14 DIAGNOSIS — R052 Subacute cough: Secondary | ICD-10-CM

## 2022-02-14 DIAGNOSIS — R531 Weakness: Secondary | ICD-10-CM

## 2022-02-14 DIAGNOSIS — D649 Anemia, unspecified: Secondary | ICD-10-CM | POA: Diagnosis not present

## 2022-02-14 DIAGNOSIS — E7800 Pure hypercholesterolemia, unspecified: Secondary | ICD-10-CM | POA: Diagnosis present

## 2022-02-14 DIAGNOSIS — E1151 Type 2 diabetes mellitus with diabetic peripheral angiopathy without gangrene: Secondary | ICD-10-CM | POA: Diagnosis present

## 2022-02-14 DIAGNOSIS — F10139 Alcohol abuse with withdrawal, unspecified: Secondary | ICD-10-CM | POA: Diagnosis not present

## 2022-02-14 DIAGNOSIS — Z8042 Family history of malignant neoplasm of prostate: Secondary | ICD-10-CM | POA: Diagnosis not present

## 2022-02-14 DIAGNOSIS — G9341 Metabolic encephalopathy: Secondary | ICD-10-CM | POA: Diagnosis present

## 2022-02-14 DIAGNOSIS — F32A Depression, unspecified: Secondary | ICD-10-CM | POA: Diagnosis present

## 2022-02-14 DIAGNOSIS — J449 Chronic obstructive pulmonary disease, unspecified: Secondary | ICD-10-CM | POA: Diagnosis present

## 2022-02-14 DIAGNOSIS — F02A0 Dementia in other diseases classified elsewhere, mild, without behavioral disturbance, psychotic disturbance, mood disturbance, and anxiety: Secondary | ICD-10-CM | POA: Diagnosis present

## 2022-02-14 DIAGNOSIS — Z7982 Long term (current) use of aspirin: Secondary | ICD-10-CM

## 2022-02-14 DIAGNOSIS — E1169 Type 2 diabetes mellitus with other specified complication: Secondary | ICD-10-CM

## 2022-02-14 DIAGNOSIS — F10231 Alcohol dependence with withdrawal delirium: Principal | ICD-10-CM | POA: Diagnosis present

## 2022-02-14 DIAGNOSIS — Z79899 Other long term (current) drug therapy: Secondary | ICD-10-CM

## 2022-02-14 DIAGNOSIS — K219 Gastro-esophageal reflux disease without esophagitis: Secondary | ICD-10-CM | POA: Diagnosis present

## 2022-02-14 DIAGNOSIS — R0602 Shortness of breath: Secondary | ICD-10-CM | POA: Diagnosis present

## 2022-02-14 DIAGNOSIS — Z806 Family history of leukemia: Secondary | ICD-10-CM

## 2022-02-14 DIAGNOSIS — M1990 Unspecified osteoarthritis, unspecified site: Secondary | ICD-10-CM | POA: Diagnosis present

## 2022-02-14 DIAGNOSIS — R45851 Suicidal ideations: Secondary | ICD-10-CM | POA: Diagnosis present

## 2022-02-14 DIAGNOSIS — I2510 Atherosclerotic heart disease of native coronary artery without angina pectoris: Secondary | ICD-10-CM | POA: Diagnosis present

## 2022-02-14 DIAGNOSIS — R4182 Altered mental status, unspecified: Secondary | ICD-10-CM | POA: Diagnosis present

## 2022-02-14 DIAGNOSIS — Z888 Allergy status to other drugs, medicaments and biological substances status: Secondary | ICD-10-CM

## 2022-02-14 DIAGNOSIS — Z781 Physical restraint status: Secondary | ICD-10-CM

## 2022-02-14 DIAGNOSIS — R053 Chronic cough: Secondary | ICD-10-CM | POA: Diagnosis not present

## 2022-02-14 DIAGNOSIS — D638 Anemia in other chronic diseases classified elsewhere: Secondary | ICD-10-CM

## 2022-02-14 DIAGNOSIS — Z7951 Long term (current) use of inhaled steroids: Secondary | ICD-10-CM

## 2022-02-14 DIAGNOSIS — F445 Conversion disorder with seizures or convulsions: Secondary | ICD-10-CM

## 2022-02-14 DIAGNOSIS — Y908 Blood alcohol level of 240 mg/100 ml or more: Secondary | ICD-10-CM | POA: Diagnosis present

## 2022-02-14 DIAGNOSIS — R7989 Other specified abnormal findings of blood chemistry: Secondary | ICD-10-CM | POA: Diagnosis present

## 2022-02-14 DIAGNOSIS — G309 Alzheimer's disease, unspecified: Secondary | ICD-10-CM | POA: Diagnosis present

## 2022-02-14 DIAGNOSIS — F10229 Alcohol dependence with intoxication, unspecified: Secondary | ICD-10-CM | POA: Diagnosis present

## 2022-02-14 DIAGNOSIS — E871 Hypo-osmolality and hyponatremia: Secondary | ICD-10-CM

## 2022-02-14 DIAGNOSIS — R059 Cough, unspecified: Secondary | ICD-10-CM | POA: Diagnosis present

## 2022-02-14 LAB — COMPREHENSIVE METABOLIC PANEL
ALT: 46 U/L — ABNORMAL HIGH (ref 0–44)
AST: 63 U/L — ABNORMAL HIGH (ref 15–41)
Albumin: 3.4 g/dL — ABNORMAL LOW (ref 3.5–5.0)
Alkaline Phosphatase: 71 U/L (ref 38–126)
Anion gap: 15 (ref 5–15)
BUN: 26 mg/dL — ABNORMAL HIGH (ref 8–23)
CO2: 24 mmol/L (ref 22–32)
Calcium: 8.4 mg/dL — ABNORMAL LOW (ref 8.9–10.3)
Chloride: 101 mmol/L (ref 98–111)
Creatinine, Ser: 0.95 mg/dL (ref 0.61–1.24)
GFR, Estimated: >60 mL/min (ref >60)
Glucose, Bld: 109 mg/dL — ABNORMAL HIGH (ref 70–99)
Potassium: 5.1 mmol/L (ref 3.5–5.1)
Sodium: 140 mmol/L (ref 135–145)
Total Bilirubin: 1 mg/dL (ref 0.3–1.2)
Total Protein: 6.7 g/dL (ref 6.5–8.1)

## 2022-02-14 LAB — CBC WITH DIFFERENTIAL/PLATELET
Abs Immature Granulocytes: 0.03 10*3/uL (ref 0.00–0.07)
Basophils Absolute: 0 10*3/uL (ref 0.0–0.1)
Basophils Relative: 0 %
Eosinophils Absolute: 0 10*3/uL (ref 0.0–0.5)
Eosinophils Relative: 1 %
HCT: 41.2 % (ref 39.0–52.0)
Hemoglobin: 13.9 g/dL (ref 13.0–17.0)
Immature Granulocytes: 1 %
Lymphocytes Relative: 23 %
Lymphs Abs: 0.5 10*3/uL — ABNORMAL LOW (ref 0.7–4.0)
MCH: 32.8 pg (ref 26.0–34.0)
MCHC: 33.7 g/dL (ref 30.0–36.0)
MCV: 97.2 fL (ref 80.0–100.0)
Monocytes Absolute: 0.4 10*3/uL (ref 0.1–1.0)
Monocytes Relative: 19 %
Neutro Abs: 1.2 10*3/uL — ABNORMAL LOW (ref 1.7–7.7)
Neutrophils Relative %: 56 %
Platelets: 252 10*3/uL (ref 150–400)
RBC: 4.24 MIL/uL (ref 4.22–5.81)
RDW: 13.2 % (ref 11.5–15.5)
WBC: 2.1 10*3/uL — ABNORMAL LOW (ref 4.0–10.5)
nRBC: 0 % (ref 0.0–0.2)

## 2022-02-14 LAB — URINALYSIS, ROUTINE W REFLEX MICROSCOPIC
Bilirubin Urine: NEGATIVE
Glucose, UA: NEGATIVE mg/dL
Hgb urine dipstick: NEGATIVE
Ketones, ur: NEGATIVE mg/dL
Leukocytes,Ua: NEGATIVE
Nitrite: NEGATIVE
Protein, ur: NEGATIVE mg/dL
Specific Gravity, Urine: 1.02 (ref 1.005–1.030)
pH: 6 (ref 5.0–8.0)

## 2022-02-14 LAB — RESP PANEL BY RT-PCR (FLU A&B, COVID) ARPGX2
Influenza A by PCR: NEGATIVE
Influenza B by PCR: NEGATIVE
SARS Coronavirus 2 by RT PCR: NEGATIVE

## 2022-02-14 LAB — TROPONIN I (HIGH SENSITIVITY)
Troponin I (High Sensitivity): 9 ng/L
Troponin I (High Sensitivity): 10 ng/L (ref <18)

## 2022-02-14 LAB — BRAIN NATRIURETIC PEPTIDE: B Natriuretic Peptide: 29.2 pg/mL (ref 0.0–100.0)

## 2022-02-14 LAB — LACTIC ACID, PLASMA
Lactic Acid, Venous: 2.8 mmol/L (ref 0.5–1.9)
Lactic Acid, Venous: 3 mmol/L (ref 0.5–1.9)
Lactic Acid, Venous: 4.7 mmol/L (ref 0.5–1.9)

## 2022-02-14 LAB — PROCALCITONIN: Procalcitonin: <0.1 ng/mL

## 2022-02-14 LAB — MAGNESIUM: Magnesium: 1.9 mg/dL (ref 1.7–2.4)

## 2022-02-14 LAB — T4, FREE: Free T4: 1.05 ng/dL (ref 0.61–1.12)

## 2022-02-14 LAB — LIPASE, BLOOD: Lipase: 40 U/L (ref 11–51)

## 2022-02-14 LAB — ETHANOL: Alcohol, Ethyl (B): 271 mg/dL — ABNORMAL HIGH (ref <10)

## 2022-02-14 LAB — TSH: TSH: 0.757 u[IU]/mL (ref 0.350–4.500)

## 2022-02-14 MED ORDER — GUAIFENESIN 100 MG/5ML PO LIQD
5.0000 mL | Freq: Once | ORAL | Status: AC
  Administered 2022-02-14: 5 mL via ORAL
  Filled 2022-02-14: qty 5

## 2022-02-14 MED ORDER — ALBUTEROL SULFATE (2.5 MG/3ML) 0.083% IN NEBU
1.2500 mg | INHALATION_SOLUTION | Freq: Four times a day (QID) | RESPIRATORY_TRACT | Status: DC | PRN
  Administered 2022-02-23: 1.25 mg via RESPIRATORY_TRACT
  Filled 2022-02-14: qty 3

## 2022-02-14 MED ORDER — MONTELUKAST SODIUM 10 MG PO TABS
10.0000 mg | ORAL_TABLET | Freq: Every day | ORAL | Status: DC
  Administered 2022-02-15 – 2022-02-27 (×11): 10 mg via ORAL
  Filled 2022-02-14 (×12): qty 1

## 2022-02-14 MED ORDER — LORAZEPAM 2 MG PO TABS: 0.0000 mg | ORAL_TABLET | Freq: Four times a day (QID) | ORAL | Status: DC

## 2022-02-14 MED ORDER — LORAZEPAM 2 MG/ML IJ SOLN
0.0000 mg | Freq: Two times a day (BID) | INTRAMUSCULAR | Status: AC
  Administered 2022-02-18: 1 mg via INTRAVENOUS
  Filled 2022-02-14 (×2): qty 1

## 2022-02-14 MED ORDER — ISOSORBIDE MONONITRATE ER 30 MG PO TB24
30.0000 mg | ORAL_TABLET | Freq: Every day | ORAL | Status: DC
  Administered 2022-02-15 – 2022-02-28 (×11): 30 mg via ORAL
  Filled 2022-02-14 (×11): qty 1

## 2022-02-14 MED ORDER — PREDNISONE 20 MG PO TABS
60.0000 mg | ORAL_TABLET | Freq: Once | ORAL | Status: AC
  Administered 2022-02-14: 60 mg via ORAL
  Filled 2022-02-14: qty 3

## 2022-02-14 MED ORDER — PAROXETINE HCL 20 MG PO TABS
20.0000 mg | ORAL_TABLET | Freq: Every day | ORAL | Status: DC
  Administered 2022-02-15 – 2022-02-28 (×11): 20 mg via ORAL
  Filled 2022-02-14 (×14): qty 1

## 2022-02-14 MED ORDER — LORAZEPAM 1 MG PO TABS
1.0000 mg | ORAL_TABLET | Freq: Once | ORAL | Status: AC
  Administered 2022-02-14: 1 mg via ORAL
  Filled 2022-02-14: qty 1

## 2022-02-14 MED ORDER — ACETAMINOPHEN 650 MG RE SUPP
650.0000 mg | Freq: Four times a day (QID) | RECTAL | Status: DC | PRN
  Administered 2022-02-17: 650 mg via RECTAL
  Filled 2022-02-14: qty 1

## 2022-02-14 MED ORDER — HEPARIN SODIUM (PORCINE) 5000 UNIT/ML IJ SOLN
5000.0000 [IU] | Freq: Three times a day (TID) | INTRAMUSCULAR | Status: DC
  Administered 2022-02-14 – 2022-02-18 (×11): 5000 [IU] via SUBCUTANEOUS
  Filled 2022-02-14 (×11): qty 1

## 2022-02-14 MED ORDER — CHLORDIAZEPOXIDE HCL 5 MG PO CAPS: 5.0000 mg | ORAL_CAPSULE | Freq: Four times a day (QID) | ORAL | Status: DC

## 2022-02-14 MED ORDER — THIAMINE HCL 100 MG/ML IJ SOLN
100.0000 mg | Freq: Every day | INTRAMUSCULAR | Status: DC
  Administered 2022-02-14 – 2022-02-18 (×4): 100 mg via INTRAVENOUS
  Filled 2022-02-14 (×4): qty 2

## 2022-02-14 MED ORDER — ACETAMINOPHEN 325 MG PO TABS
650.0000 mg | ORAL_TABLET | Freq: Four times a day (QID) | ORAL | Status: DC | PRN
  Administered 2022-02-18 – 2022-02-22 (×7): 650 mg via ORAL
  Filled 2022-02-14 (×7): qty 2

## 2022-02-14 MED ORDER — IOHEXOL 350 MG/ML SOLN
75.0000 mL | Freq: Once | INTRAVENOUS | Status: AC | PRN
  Administered 2022-02-14: 75 mL via INTRAVENOUS

## 2022-02-14 MED ORDER — NITROGLYCERIN 0.4 MG SL SUBL
0.4000 mg | SUBLINGUAL_TABLET | SUBLINGUAL | Status: DC | PRN
  Administered 2022-02-20 (×2): 0.4 mg via SUBLINGUAL
  Filled 2022-02-14 (×2): qty 1

## 2022-02-14 MED ORDER — MAGNESIUM SULFATE IN D5W 1-5 GM/100ML-% IV SOLN
1.0000 g | Freq: Once | INTRAVENOUS | Status: AC
  Administered 2022-02-14: 1 g via INTRAVENOUS
  Filled 2022-02-14: qty 100

## 2022-02-14 MED ORDER — SODIUM CHLORIDE 0.9 % IV SOLN: INTRAVENOUS | Status: AC

## 2022-02-14 MED ORDER — DONEPEZIL HCL 5 MG PO TABS
5.0000 mg | ORAL_TABLET | Freq: Every day | ORAL | Status: DC
  Administered 2022-02-15 – 2022-02-19 (×4): 5 mg via ORAL
  Filled 2022-02-14 (×7): qty 1

## 2022-02-14 MED ORDER — THIAMINE HCL 100 MG PO TABS
100.0000 mg | ORAL_TABLET | Freq: Every day | ORAL | Status: DC
  Administered 2022-02-15 – 2022-02-21 (×4): 100 mg via ORAL
  Filled 2022-02-14 (×5): qty 1

## 2022-02-14 MED ORDER — IPRATROPIUM-ALBUTEROL 0.5-2.5 (3) MG/3ML IN SOLN
3.0000 mL | Freq: Once | RESPIRATORY_TRACT | Status: AC
  Administered 2022-02-14: 3 mL via RESPIRATORY_TRACT
  Filled 2022-02-14: qty 3

## 2022-02-14 MED ORDER — ASPIRIN EC 81 MG PO TBEC: 81.0000 mg | DELAYED_RELEASE_TABLET | Freq: Every day | ORAL | Status: DC

## 2022-02-14 MED ORDER — VENLAFAXINE HCL ER 37.5 MG PO CP24
37.5000 mg | ORAL_CAPSULE | Freq: Every day | ORAL | Status: DC
  Administered 2022-02-15 – 2022-02-28 (×11): 37.5 mg via ORAL
  Filled 2022-02-14 (×14): qty 1

## 2022-02-14 MED ORDER — SODIUM CHLORIDE 0.9 % IV BOLUS
1000.0000 mL | Freq: Once | INTRAVENOUS | Status: AC
Start: 2022-02-14 — End: 2022-02-14
  Administered 2022-02-14: 1000 mL via INTRAVENOUS

## 2022-02-14 MED ORDER — PANTOPRAZOLE SODIUM 40 MG IV SOLR
40.0000 mg | Freq: Two times a day (BID) | INTRAVENOUS | Status: DC
  Administered 2022-02-14 – 2022-02-17 (×6): 40 mg via INTRAVENOUS
  Filled 2022-02-14 (×6): qty 10

## 2022-02-14 MED ORDER — LORAZEPAM 2 MG PO TABS: 0.0000 mg | ORAL_TABLET | Freq: Two times a day (BID) | ORAL | Status: AC

## 2022-02-14 MED ORDER — DILTIAZEM HCL ER COATED BEADS 240 MG PO CP24
240.0000 mg | ORAL_CAPSULE | Freq: Every morning | ORAL | Status: DC
Start: 2022-02-15 — End: 2022-02-28
  Administered 2022-02-15 – 2022-02-28 (×11): 240 mg via ORAL
  Filled 2022-02-14 (×14): qty 1

## 2022-02-14 MED ORDER — MOMETASONE FURO-FORMOTEROL FUM 100-5 MCG/ACT IN AERO
2.0000 | INHALATION_SPRAY | Freq: Two times a day (BID) | RESPIRATORY_TRACT | Status: DC
  Administered 2022-02-15 – 2022-02-28 (×22): 2 via RESPIRATORY_TRACT
  Filled 2022-02-14: qty 8.8

## 2022-02-14 MED ORDER — FLUTICASONE PROPIONATE 50 MCG/ACT NA SUSP
1.0000 | Freq: Every day | NASAL | Status: DC | PRN
  Filled 2022-02-14: qty 16

## 2022-02-14 MED ORDER — SODIUM CHLORIDE 0.9 % IV SOLN: INTRAVENOUS | Status: DC

## 2022-02-14 MED ORDER — SODIUM CHLORIDE 0.9 % IV BOLUS
1000.0000 mL | Freq: Once | INTRAVENOUS | Status: AC
  Administered 2022-02-14: 1000 mL via INTRAVENOUS

## 2022-02-14 MED ORDER — LORAZEPAM 2 MG/ML IJ SOLN
0.0000 mg | Freq: Four times a day (QID) | INTRAMUSCULAR | Status: DC
  Administered 2022-02-14 – 2022-02-15 (×2): 2 mg via INTRAVENOUS
  Administered 2022-02-15: 1 mg via INTRAVENOUS
  Administered 2022-02-15: 2 mg via INTRAVENOUS
  Administered 2022-02-16: 1 mg via INTRAVENOUS
  Filled 2022-02-14 (×2): qty 1
  Filled 2022-02-14: qty 2
  Filled 2022-02-14 (×3): qty 1

## 2022-02-14 NOTE — ED Notes (Signed)
Pt called out requesting assistance to use urinal. Pt visibly agitated and restless with mild tremors present. CIWA completed

## 2022-02-14 NOTE — H&P (Signed)
History and Physical    Patient: Brett Blake WUJ:[id number] DOB: 1941-02-07 DOA: 02/14/2022 DOS: the patient was seen and examined on 02/15/2022 PCP: [name], MD  Patient coming from: Home  Chief Complaint:  Chief Complaint  Patient presents with   Shortness of Breath   Alcohol Intoxication    HPI: Brett Blake is a 81 y.o. male with medical history significant of COPD, alcohol abuse, GERD, hypertension, sleep apnea brought to the ED for shortness of breath.  Patient also reports cough that has been nonproductive.  HPI is per ED note and nursing note, patient does somewhat lethargic due to alcohol intoxication.  Patient drinks whiskey every day.  Wife drove him to [facility] he started seeing let me die and was brought to the hospital.  Review of Systems: unable to review all systems due to the inability of the patient to answer questions. Past Medical History:  Diagnosis Date   Anxiety    Arthritis    Asthma    Colon polyps    COPD (chronic obstructive pulmonary disease) ([place])    Depression    Diabetes ([place])    Difficult intubation    GERD (gastroesophageal reflux disease)    High cholesterol    Hyperlipidemia    Hypertension    Sleep apnea    Past Surgical History:  Procedure Laterality Date   BRONCHOSCOPY     CARDIAC CATHETERIZATION Left 11/04/2016   Procedure: Left Heart Cath and Coronary Angiography;  Surgeon: Teodoro Spray, MD;  Location: [facility];  Service: Cardiovascular;  Laterality: Left;   CARDIAC CATHETERIZATION     CHOLECYSTECTOMY N/A 05/09/2019   Procedure: LAPAROSCOPIC CHOLECYSTECTOMY WITH INTRAOPERATIVE CHOLANGIOGRAM;  Surgeon: Jules Husbands, MD;  Location: [facility];  Service: General;  Laterality: N/A;   COLOSTOMY     LEFT HEART CATH AND CORONARY ANGIOGRAPHY N/A 12/14/2021   Procedure: LEFT HEART CATH AND CORONARY ANGIOGRAPHY;  Surgeon: Corey Skains, MD;  Location: [facility];  Service: Cardiovascular;   Laterality: N/A;   ROTATOR CUFF REPAIR Left    THUMB ARTHROSCOPY  2015   TONSILLECTOMY  1952   TONSILLECTOMY     Social History:  reports that he has quit smoking. His smoking use included cigarettes. He has a 15.00 pack-year smoking history. He has never used smokeless tobacco. He reports current alcohol use of about 1.0 - 2.0 standard drink per week. He reports that he does not use drugs.  Allergies  Allergen Reactions   Ace Inhibitors Cough   Metoprolol Diarrhea   Sulfa Antibiotics Rash    Family History  Problem Relation Age of Onset   Heart attack Mother    Prostate cancer Father    Leukemia Brother    Prostate cancer Brother     Prior to Admission medications   Medication Sig Start Date End Date Taking? Authorizing Provider  albuterol (ACCUNEB) 1.25 MG/3ML nebulizer solution TAKE 3 MLS (1.25 MG TOTAL) BY NEBULIZATION EVERY 6 (SIX) HOURS AS NEEDED FOR WHEEZING 04/08/19   [provider]  ALPRAZolam Duanne Moron) 0.5 MG tablet Take 0.5 tablets by mouth daily as needed. 03/05/19   [provider]  aspirin 81 MG chewable tablet Chew 81 mg by mouth daily.    [provider]  diltiazem (CARDIZEM CD) 240 MG 24 hr capsule Take 240 mg by mouth every morning. 12/07/21   [provider]  donepezil (ARICEPT) 5 MG tablet Take 5 mg by mouth at bedtime.  05/14/20  05/14/21  [provider]  fluticasone (FLONASE) 50 MCG/ACT nasal spray Place 1 spray into both nostrils daily as needed for allergies or rhinitis.    [provider]  glucosamine-chondroitin 500-400 MG tablet Take 1 tablet by mouth daily.    [provider]  ibuprofen (ADVIL,MOTRIN) 200 MG tablet Take 200 mg by mouth every 6 (six) hours as needed.    [provider]  isosorbide mononitrate (IMDUR) 30 MG 24 hr tablet Take 30 mg by mouth daily. 06/16/20   [provider]  meclizine (ANTIVERT) 25 MG tablet Take 25 mg by mouth at bedtime. 11/17/21   [provider]  montelukast (SINGULAIR) 10 MG tablet Take 10 mg by mouth at bedtime.    [provider]  Multiple Vitamins-Minerals (MULTIVITAMIN PO) Take 1 tablet by mouth daily.    [provider]  nitroGLYCERIN (NITROSTAT) 0.4 MG SL tablet Place 0.4 mg under the tongue every 5 (five) minutes as needed for chest pain.    [provider]  omeprazole (PRILOSEC) 40 MG capsule Take 40 mg by mouth daily. 06/05/20   [provider]  propranolol (INDERAL) 40 MG tablet Take 40 mg by mouth 2 (two) times daily. 12/05/21   [provider]  rosuvastatin (CRESTOR) 5 MG tablet Take 5 mg by mouth daily. 12/06/21   [provider]  SYMBICORT 80-4.5 MCG/ACT inhaler Inhale 2 puffs into the lungs 2 (two) times daily as needed.  05/19/20   [provider]  triamterene-hydrochlorothiazide (DYAZIDE) 37.5-25 MG per capsule Take 1 capsule by mouth daily.    [provider]  venlafaxine XR (EFFEXOR-XR) 37.5 MG 24 hr capsule Take 37.5 mg by mouth daily. 12/07/21   [provider]    Physical Exam: Vitals:   02/14/22 2130 02/14/22 2230 02/14/22 2231 02/14/22 2336  BP: (!) 166/112 (!) 174/121 (!) 174/121 (!) 157/99  Pulse: (!) 120 (!) 133 (!) 129 (!) 116  Resp: 19 (!) 22  16  Temp:    98.3 F (36.8 C)  TempSrc:      SpO2: 96% 97%  96%  Weight:      Height:      In the emergency room patient meets sepsis criteria with heart rate and respiratory rate and elevated lactic but no clear source.  Patient was started on CIWA and alcohol withdrawal protocol in the emergency room. Physical Exam Vitals reviewed.  Constitutional:      General: He is sleeping. He is not in acute distress.    Appearance: Normal appearance. He is not ill-appearing.  HENT:     Head: Normocephalic and atraumatic.     Right Ear: External ear normal.     Left Ear: External ear normal.     Nose: Nose normal.     Mouth/Throat:     Mouth: Mucous membranes are moist.   Eyes:     Extraocular Movements: Extraocular movements intact.     Pupils: Pupils are equal, round, and reactive to light.  Cardiovascular:     Rate and Rhythm: Normal rate and regular rhythm.     Pulses: Normal pulses.     Heart sounds: Normal heart sounds.  Pulmonary:     Effort: Pulmonary effort is normal.     Breath sounds: Normal breath sounds.  Abdominal:     General: Bowel sounds are normal. There is no distension.     Palpations: Abdomen is soft. There is no mass.     Tenderness: There is no abdominal  tenderness. There is no guarding.     Hernia: No hernia is present.  Musculoskeletal:     Right lower leg: No edema.     Left lower leg: No edema.  Skin:    General: Skin is warm.  Neurological:     General: No focal deficit present.     Mental Status: He is disoriented and confused.     Cranial Nerves: No cranial nerve deficit, dysarthria or facial asymmetry.     Motor: No seizure activity.     Comments: Patient is somnolent.    Data Reviewed: > CMP shows glucose of 109 normal kidney function anion gap 15 magnesium 1.9 lipase 40 AST 63 ALT 46, troponin of 9, troponin 10, BNP of 29.2. > Lactic acid of 2.2, 3.0, 4.7, procalcitonin of less than 0.10 > CBC shows a white count of 2.1, hemoglobin of 13.9, platelets of 252. > Glucose of 109, TSH of 0.757, free T4 1.05. > Respiratory panel negative for flu and COVID. > Alcohol level of 271. > CT head noncontrast shows no acute intracranial process, mild chronic ischemic white matter changes. > CT angio chest PE protocol shows no PE, lungs are clear without focal consolidation or pleural effusion.  Assessment and Plan: Cough-  (present on admission) CTA evaluation of chest negative for any infiltrates consolidation or effusion. No direct causes of cough suspect patient may have underlying reflux related cough or GERD variant asthma presentation.  We will start patient on IV PPI therapy and as needed antitussives. As needed  MDI as needed.  AMS (altered mental status)- (present on admission) Attribute to alcohol intoxication. Admit to progressive unit with withdrawal protocol with scheduled Librium. Patient also started on thiamine and IV fluids. Aspiration, fall, seizure precautions. CIWA assessment and as needed lorazepam. Low threshold for Precedex as needed.  Abnormal LFTs- (present on admission) Attribute mild transaminitis secondary to alcohol abuse.   Chronic alcohol abuse- (present on admission) Alcohol level of 270, alcohol withdrawal protocol. IV PPI therapy. Magnesium replacement.  Chest pain- (present on admission) Troponins negative. Patient had a left heart catheterization in 2022 December results as follows: Left heart cath done by Dr. Nehemiah Massed. Cardiac catheterization with continued 55% stenosis of proximal left anterior descending artery unchanged from previous cardiac catheterization No further cardiac intervention at this time Single antiplatelet therapy High intensity cholesterol therapy Antianginals for chest pain Hypertension control for goal systolic blood pressure below 130 mm Cardiac rehab   Attribute patient's chest discomfort to reflux or esophageal issues may consider an upper GI evaluation or GI consult for chest pain persists.  Pseudoseizures ([place]) Per chart review patient's EEG is negative and normal MRI. Seizure precautions.  GERD (gastroesophageal reflux disease)- (present on admission) IV PPI therapy continued.  COPD (chronic obstructive pulmonary disease) ([place])- (present on admission) Patient continued on albuterol, Flonase, Singulair, Dulera.  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  Sleep apnea- (present on admission) CPAP per home settings.  Essential hypertension- (present on admission) Blood pressure (!) 157/99, pulse (!) 116, temperature 98.3 F (36.8 C), resp. rate 16, height 5\' 4"  (1.626 m),  weight 73.7 kg, SpO2 96 %. Patient continued on diltiazem and Imdur.  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 as he is aspiration risk. Glycemic protocol and sliding scale insulin resume diet when appropriate per a.m. team.  Advance Care Planning:   Code Status: Full Code   Consults:  None   Family Communication:  Feig,Frances  M (Spouse)  [phone] ([place])  Severity of Illness: The appropriate patient status for this patient is INPATIENT. Inpatient status is judged to be reasonable and necessary in order to provide the required intensity of service to ensure the patient's safety. The patient's presenting symptoms, physical exam findings, and initial radiographic and laboratory data in the context of their chronic comorbidities is felt to place them at high risk for further clinical deterioration. Furthermore, it is not anticipated that the patient will be medically stable for discharge from the hospital within 2 midnights of admission.   * I certify that at the point of admission it is my clinical judgment that the patient will require inpatient hospital care spanning beyond 2 midnights from the point of admission due to high intensity of service, high risk for further deterioration and high frequency of surveillance required.*  Author: Para Skeans, MD 02/15/2022 1:04 AM  For on call review [email or web address].

## 2022-02-14 NOTE — ED Notes (Signed)
Pt alert, NAD, calm, interactive, persistent frequent hacking dry cough present. Registration at [facility]. Given urinal.

## 2022-02-14 NOTE — ED Notes (Signed)
Calmer, coughing less. Resting more comfortably.

## 2022-02-14 NOTE — ED Notes (Signed)
Neb complete, IVF bolus complete, labs sent, wife into room, at [facility].

## 2022-02-14 NOTE — ED Triage Notes (Signed)
Pt here with SOB and etoh from [facility]. Pt was found on floor by staffing yelling out" I can't breathe" and "just let me die, I'm a Christian". Pt coughing in triage.Marland Kitchen

## 2022-02-14 NOTE — ED Provider Triage Note (Signed)
Emergency Medicine Provider Triage Evaluation Note  Brett Blake , a 81 y.o. male  was evaluated in triage.  Pt complains of shortness of breath, cough, felt sick for 1 week, was brought over on a stretcher by [place] [facility] as they found him on the floor.  Unsure if it was a fall or if he collapsed..  Review of Systems  Positive: Cough shortness of breath, stating he wants to die Negative: Unable to assess  Physical Exam  There were no vitals taken for this visit. Gen:   Awake, mild, Resp:  Cough, grunting MSK:   Moves extremities without difficulty  Other:    Medical Decision Making  Medically screening exam initiated at 1:41 PM.  Appropriate orders placed.  Brett Blake was informed that the remainder of the evaluation will be completed by another provider, this initial triage assessment does not replace that evaluation, and the importance of remaining in the ED until their evaluation is complete.  Instructed nursing staff to try and obtain a room for the patient   Brett Blake 02/14/22 1342

## 2022-02-14 NOTE — ED Notes (Signed)
Lactate increased, EDP notified, urine sent, VSS, no changes.

## 2022-02-14 NOTE — ED Notes (Signed)
EDP at [facility] 

## 2022-02-14 NOTE — ED Provider Notes (Signed)
[facility] Provider Note    Event Date/Time   First MD Initiated Contact with Patient 02/14/22 1523     (approximate)   History   Shortness of Breath and Alcohol Intoxication   HPI  Brett Blake is a 81 y.o. male with past medical history of asthma and COPD, anxiety, depression, hyperlipidemia, hypertension presents with cough and shortness of breath.  Patient tells me he has had cough for 3 days.  He has associated shortness of breath.  Cough is unproductive, no hemoptysis.  Denies fevers chills chest pain shortness of breath abdominal pain nausea or vomiting.  Patient drank alcohol last night, says more than he would normally as well as honey and attempt to get the cough to go away.  He denies drinking alcohol today.  His wife drove him to [facility] where by report he apparently threw himself on the ground and said just let me die.  Patient denies drinking alcohol daily.    Past Medical History:  Diagnosis Date   Anxiety    Arthritis    Asthma    Colon polyps    COPD (chronic obstructive pulmonary disease) (HCC)    Depression    Diabetes ([place])    Difficult intubation    GERD (gastroesophageal reflux disease)    High cholesterol    Hyperlipidemia    Hypertension    Sleep apnea     Patient Active Problem List   Diagnosis Date Noted   COPD (chronic obstructive pulmonary disease) ([place]) 02/14/2022   GERD (gastroesophageal reflux disease) 02/14/2022   Cough 02/14/2022   Abnormal LFTs 02/14/2022   AMS (altered mental status) 02/14/2022   Unstable angina ([place]) 12/11/2021   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[place]) 11/17/2021   Sepsis ([place]) 05/20/2021   Acute gastroenteritis 05/20/2021   Chronic alcohol abuse 04/20/2021   Pseudoseizures ([place]) 11/16/2020   Chest pain 07/16/2020   Depression    Cholecystitis 05/08/2019   Coronary artery disease involving native coronary artery of native heart 02/20/2018   Paroxysmal tachycardia ([place]) 02/23/2017    Colon polyps 06/29/2016   Essential hypertension 06/29/2016   Elevated PSA 06/29/2016   Hyperlipidemia, mixed 04/19/2016   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 ([place]) 10/19/2015   Insomnia, persistent 09/03/2015   Chronic insomnia 09/03/2015   Sleep apnea 02/13/2015   Chronic bronchitis ([place]) 12/14/2011     Physical Exam  Triage Vital Signs: ED Triage Vitals  Enc Vitals Group     BP 02/14/22 1340 (!) 185/107     Pulse Rate 02/14/22 1340 (!) 108     Resp 02/14/22 1340 (!) 22     Temp 02/14/22 1340 98.2 F (36.8 C)     Temp Source 02/14/22 1340 Oral     SpO2 02/14/22 1340 97 %     Weight 02/14/22 1345 162 lb 7.7 oz (73.7 kg)     Height 02/14/22 1345 5\' 4"  (1.626 m)     Head Circumference --      Peak Flow --      Pain Score 02/14/22 1345 0     Pain Loc --      Pain Edu? --      Excl. in [place]? --     Most recent vital signs: Vitals:   02/14/22 1830 02/14/22 1845  BP: (!) 152/89 (!) 162/95  Pulse: (!) 105 (!) 108  Resp: 19 (!) 21  Temp:    SpO2: 92% 93%     General: Awake, no distress.  Frequent cough CV:  Good peripheral perfusion.  No lower extremity edema or asymmetry Resp:  Normal effort.  Patient is coughing, good air movement without wheezing Abd:  No distention.  Soft and nontender throughout Neuro:             Awake, Alert, Oriented x 3  Other:     ED Results / Procedures / Treatments  Labs (all labs ordered are listed, but only abnormal results are displayed) Labs Reviewed  COMPREHENSIVE METABOLIC PANEL - Abnormal; Notable for the following components:      Result Value   Glucose, Bld 109 (*)    BUN 26 (*)    Calcium 8.4 (*)    Albumin 3.4 (*)    AST 63 (*)    ALT 46 (*)    All other components within normal limits  LACTIC ACID, PLASMA - Abnormal; Notable for the following components:   Lactic Acid, Venous 2.8 (*)    All other components within normal limits  LACTIC ACID, PLASMA - Abnormal; Notable for the following components:    Lactic Acid, Venous 3.0 (*)    All other components within normal limits  CBC WITH DIFFERENTIAL/PLATELET - Abnormal; Notable for the following components:   WBC 2.1 (*)    Neutro Abs 1.2 (*)    Lymphs Abs 0.5 (*)    All other components within normal limits  URINALYSIS, ROUTINE W REFLEX MICROSCOPIC - Abnormal; Notable for the following components:   Color, Urine YELLOW (*)    APPearance CLEAR (*)    All other components within normal limits  ETHANOL - Abnormal; Notable for the following components:   Alcohol, Ethyl (B) 271 (*)    All other components within normal limits  LACTIC ACID, PLASMA - Abnormal; Notable for the following components:   Lactic Acid, Venous 4.7 (*)    All other components within normal limits  RESP PANEL BY RT-PCR (FLU A&B, COVID) ARPGX2  CULTURE, BLOOD (ROUTINE X 2)  CULTURE, BLOOD (ROUTINE X 2)  MAGNESIUM  LIPASE, BLOOD  PROCALCITONIN  BRAIN NATRIURETIC PEPTIDE  HEMOGLOBIN A1C  TSH  T4, FREE  OCCULT BLOOD X 1 CARD TO LAB, STOOL  COMPREHENSIVE METABOLIC PANEL  CBC  POC SARS CORONAVIRUS 2 AG -  ED  TROPONIN I (HIGH SENSITIVITY)  TROPONIN I (HIGH SENSITIVITY)     EKG  EKG interpreted by myself, sinus tachycardia, normal axis normal intervals no acute ischemic changes   RADIOLOGY I reviewed the CXR which does not show any acute cardiopulmonary process; agree with radiology report     PROCEDURES:  Critical Care performed: No  .1-3 Lead EKG Interpretation Performed by: Rada Hay, MD Authorized by: Rada Hay, MD     Interpretation: normal     ECG rate assessment: normal     Ectopy: none     Conduction: normal    The patient is on the cardiac monitor to evaluate for evidence of arrhythmia and/or significant heart rate changes.   MEDICATIONS ORDERED IN ED: Medications  LORazepam (ATIVAN) injection 0-4 mg (has no administration in time range)    Or  LORazepam (ATIVAN) tablet 0-4 mg (has no administration in time  range)  LORazepam (ATIVAN) injection 0-4 mg (has no administration in time range)    Or  LORazepam (ATIVAN) tablet 0-4 mg (has no administration in time range)  thiamine tablet 100 mg ( Oral See Alternative 02/14/22 1936)    Or  thiamine (B-1) injection 100 mg (100 mg Intravenous Given  02/14/22 1936)  pantoprazole (PROTONIX) injection 40 mg (40 mg Intravenous Given 02/14/22 2002)  magnesium sulfate IVPB 1 g 100 mL (has no administration in time range)  0.9 %  sodium chloride infusion ( Intravenous Not Given 02/14/22 2016)  PARoxetine (PAXIL) tablet 20 mg (has no administration in time range)  venlafaxine XR (EFFEXOR-XR) 24 hr capsule 37.5 mg (has no administration in time range)  chlordiazePOXIDE (LIBRIUM) capsule 5 mg (has no administration in time range)  mometasone-formoterol (DULERA) 100-5 MCG/ACT inhaler 2 puff (has no administration in time range)  montelukast (SINGULAIR) tablet 10 mg (has no administration in time range)  fluticasone (FLONASE) 50 MCG/ACT nasal spray 1 spray (has no administration in time range)  albuterol (ACCUNEB) nebulizer solution 1.25 mg (has no administration in time range)  donepezil (ARICEPT) tablet 5 mg (has no administration in time range)  nitroGLYCERIN (NITROSTAT) SL tablet 0.4 mg (has no administration in time range)  diltiazem (CARDIZEM CD) 24 hr capsule 240 mg (has no administration in time range)  isosorbide mononitrate (IMDUR) 24 hr tablet 30 mg (has no administration in time range)  heparin injection 5,000 Units (has no administration in time range)  0.9 %  sodium chloride infusion ( Intravenous New Bag/Given 02/14/22 2015)  acetaminophen (TYLENOL) tablet 650 mg (has no administration in time range)    Or  acetaminophen (TYLENOL) suppository 650 mg (has no administration in time range)  sodium chloride 0.9 % bolus 1,000 mL (0 mLs Intravenous Stopped 02/14/22 1610)  ipratropium-albuterol (DUONEB) 0.5-2.5 (3) MG/3ML nebulizer solution 3 mL (3 mLs  Nebulization Given 02/14/22 1545)  predniSONE (DELTASONE) tablet 60 mg (60 mg Oral Given 02/14/22 1545)  sodium chloride 0.9 % bolus 1,000 mL (0 mLs Intravenous Stopped 02/14/22 1747)  iohexol (OMNIPAQUE) 350 MG/ML injection 75 mL (75 mLs Intravenous Contrast Given 02/14/22 1704)  LORazepam (ATIVAN) tablet 1 mg (1 mg Oral Given 02/14/22 1753)  guaiFENesin (ROBITUSSIN) 100 MG/5ML liquid 5 mL (5 mLs Oral Given 02/14/22 1754)     IMPRESSION / MDM / ASSESSMENT AND PLAN / ED COURSE  I reviewed the triage vital signs and the nursing notes.                              Differential diagnosis includes, but is not limited to, viral illness, bronchitis, pneumonia, pulmonary embolism  Patient is a 81 year old male who presents with cough and shortness of breath.  He was drinking alcohol last night and apparently on presentation to [facility] clinic today seemed intoxicated and threw himself on the floor.  Patient denies drinking alcohol this morning and says he only did this last night and was more than he normally would drink.  He has a nonproductive cough and dyspnea.  Patient is coughing throughout the evaluation in the room.  It is a dry cough.  He has however not dyspneic and he has good air movement without wheezing.  No lower extreme edema or asymmetry to suggest DVT or fluid overload.  I reviewed his chest x-ray which has no infiltrate to suggest pneumonia.  His EKG shows mildly elevated heart rate sinus tachycardia without ischemic changes.  Patient has no risk factors for pulmonary embolism and with the prominent cough my suspicion for this is low I suspect that he has a viral bronchitis.  His alcohol level is 271 obtained from triage.  Other labs are notable for pia of 2.1 which could be in the setting of viral illness versus  alcohol intake.  He and ALT are also mildly elevated likely from alcohol intake.  AK-Tate is 2.8, I suspect that this is in the setting of him not eating and being somewhat volume  depleted in the setting of heavy alcohol use.  Initial troponin is negative will repeat.  We will give him a DuoNeb and prednisone for the cough and his breathing to see if this helps.  We will give him a liter of fluid and repeat the lactate.  I have low suspicion for sepsis at this time suspect that the elevated lactate is in the setting of him being volume down.   Pt cough somewhat improved after nebs and steroids.  Repeat lactate is actually increased to 3.  We will give another liter of fluid.  We will also obtain a CTA to evaluate for pulmonary embolism and evaluate for any infiltrate.    CTA is negative for pulmonary embolism.  Repeat troponin is negative.  Patient's lactate continues to rise now 4.7.  Repeat evaluation his wife is now at bedside tells me that he drinks daily.  He drinks bourbon and has been drinking larger quantities lately.  Since Friday tells me he has practically been drunk all day.  This is not usual for him.  Does have a history of withdrawal.  She thinks he may be having some withdrawal currently.  She also tells me that he completed a course of prednisone and amoxicillin last week for the cough as well.  I suspect that patient is having alcohol withdrawal currently is tachycardic tachypneic and hypertensive.  Will place on CIWA.  Discussed with the hospitalist for admission.  FINAL CLINICAL IMPRESSION(S) / ED DIAGNOSES   Final diagnoses:  Lactic acidosis  Alcohol dependence with unspecified alcohol-induced disorder ([place])     Rx / DC Orders   ED Discharge Orders     None        Note:  This document was prepared using Dragon voice recognition software and may include unintentional dictation errors.   Rada Hay, MD 02/14/22 2018

## 2022-02-14 NOTE — ED Notes (Signed)
Family x2 at [facility]. Pt calmer. Given drink and snack per request.

## 2022-02-14 NOTE — ED Notes (Signed)
No changes, to CT

## 2022-02-15 DIAGNOSIS — F1010 Alcohol abuse, uncomplicated: Secondary | ICD-10-CM

## 2022-02-15 DIAGNOSIS — F10139 Alcohol abuse with withdrawal, unspecified: Secondary | ICD-10-CM | POA: Diagnosis present

## 2022-02-15 DIAGNOSIS — I10 Essential (primary) hypertension: Secondary | ICD-10-CM

## 2022-02-15 DIAGNOSIS — F10931 Alcohol use, unspecified with withdrawal delirium: Secondary | ICD-10-CM | POA: Diagnosis present

## 2022-02-15 DIAGNOSIS — R7989 Other specified abnormal findings of blood chemistry: Secondary | ICD-10-CM | POA: Diagnosis not present

## 2022-02-15 LAB — CBC
HCT: 34.5 % — ABNORMAL LOW (ref 39.0–52.0)
Hemoglobin: 11.8 g/dL — ABNORMAL LOW (ref 13.0–17.0)
MCH: 32.6 pg (ref 26.0–34.0)
MCHC: 34.2 g/dL (ref 30.0–36.0)
MCV: 95.3 fL (ref 80.0–100.0)
Platelets: 208 10*3/uL (ref 150–400)
RBC: 3.62 MIL/uL — ABNORMAL LOW (ref 4.22–5.81)
RDW: 13.2 % (ref 11.5–15.5)
WBC: 3.4 10*3/uL — ABNORMAL LOW (ref 4.0–10.5)
nRBC: 0 % (ref 0.0–0.2)

## 2022-02-15 LAB — GLUCOSE, CAPILLARY
Glucose-Capillary: 151 mg/dL — ABNORMAL HIGH (ref 70–99)
Glucose-Capillary: 162 mg/dL — ABNORMAL HIGH (ref 70–99)

## 2022-02-15 LAB — COMPREHENSIVE METABOLIC PANEL
ALT: 39 U/L (ref 0–44)
AST: 44 U/L — ABNORMAL HIGH (ref 15–41)
Albumin: 3 g/dL — ABNORMAL LOW (ref 3.5–5.0)
Alkaline Phosphatase: 67 U/L (ref 38–126)
Anion gap: 11 (ref 5–15)
BUN: 19 mg/dL (ref 8–23)
CO2: 25 mmol/L (ref 22–32)
Calcium: 8.1 mg/dL — ABNORMAL LOW (ref 8.9–10.3)
Chloride: 102 mmol/L (ref 98–111)
Creatinine, Ser: 0.8 mg/dL (ref 0.61–1.24)
GFR, Estimated: >60 mL/min (ref >60)
Glucose, Bld: 143 mg/dL — ABNORMAL HIGH (ref 70–99)
Potassium: 3.5 mmol/L (ref 3.5–5.1)
Sodium: 138 mmol/L (ref 135–145)
Total Bilirubin: 0.8 mg/dL (ref 0.3–1.2)
Total Protein: 5.9 g/dL — ABNORMAL LOW (ref 6.5–8.1)

## 2022-02-15 LAB — OCCULT BLOOD X 1 CARD TO LAB, STOOL: Fecal Occult Bld: NEGATIVE

## 2022-02-15 LAB — LACTIC ACID, PLASMA
Lactic Acid, Venous: 6.4 mmol/L (ref 0.5–1.9)
Lactic Acid, Venous: 2.6 mmol/L (ref 0.5–1.9)

## 2022-02-15 MED ORDER — CHLORDIAZEPOXIDE HCL 5 MG PO CAPS: 10.0000 mg | ORAL_CAPSULE | Freq: Four times a day (QID) | ORAL | Status: DC

## 2022-02-15 MED ORDER — INSULIN ASPART 100 UNIT/ML IJ SOLN
0.0000 [IU] | Freq: Every day | INTRAMUSCULAR | Status: DC
  Filled 2022-02-15: qty 1

## 2022-02-15 MED ORDER — INSULIN ASPART 100 UNIT/ML IJ SOLN
0.0000 [IU] | Freq: Three times a day (TID) | INTRAMUSCULAR | Status: DC
  Administered 2022-02-15 – 2022-02-16 (×2): 2 [IU] via SUBCUTANEOUS
  Administered 2022-02-16: 3 [IU] via SUBCUTANEOUS
  Administered 2022-02-16 – 2022-02-17 (×2): 2 [IU] via SUBCUTANEOUS
  Administered 2022-02-17 – 2022-02-18 (×2): 1 [IU] via SUBCUTANEOUS
  Administered 2022-02-19: 3 [IU] via SUBCUTANEOUS
  Administered 2022-02-19 (×2): 2 [IU] via SUBCUTANEOUS
  Administered 2022-02-20: 1 [IU] via SUBCUTANEOUS
  Administered 2022-02-20: 2 [IU] via SUBCUTANEOUS
  Administered 2022-02-21: 1 [IU] via SUBCUTANEOUS
  Administered 2022-02-21: 2 [IU] via SUBCUTANEOUS
  Administered 2022-02-21: 1 [IU] via SUBCUTANEOUS
  Administered 2022-02-22 (×2): 2 [IU] via SUBCUTANEOUS
  Administered 2022-02-23 (×2): 1 [IU] via SUBCUTANEOUS
  Filled 2022-02-15 (×18): qty 1

## 2022-02-15 MED ORDER — CHLORDIAZEPOXIDE HCL 5 MG PO CAPS
5.0000 mg | ORAL_CAPSULE | Freq: Once | ORAL | Status: AC
  Administered 2022-02-15: 5 mg via ORAL
  Filled 2022-02-15: qty 1

## 2022-02-15 MED ORDER — LIVING WELL WITH DIABETES BOOK
Freq: Once | Status: AC
  Filled 2022-02-15: qty 1

## 2022-02-15 MED ORDER — LORAZEPAM 2 MG/ML IJ SOLN
1.0000 mg | INTRAMUSCULAR | Status: AC | PRN
  Administered 2022-02-15: 1 mg via INTRAVENOUS
  Administered 2022-02-15 – 2022-02-16 (×3): 4 mg via INTRAVENOUS
  Administered 2022-02-17 (×3): 2 mg via INTRAVENOUS
  Administered 2022-02-17: 1 mg via INTRAVENOUS
  Administered 2022-02-18: 4 mg via INTRAVENOUS
  Filled 2022-02-15: qty 1
  Filled 2022-02-15: qty 2
  Filled 2022-02-15: qty 1
  Filled 2022-02-15: qty 2
  Filled 2022-02-15 (×3): qty 1
  Filled 2022-02-15: qty 2
  Filled 2022-02-15: qty 1

## 2022-02-15 MED ORDER — CHLORDIAZEPOXIDE HCL 5 MG PO CAPS
10.0000 mg | ORAL_CAPSULE | Freq: Four times a day (QID) | ORAL | Status: AC
  Administered 2022-02-15 (×2): 10 mg via ORAL
  Administered 2022-02-15: 5 mg via ORAL
  Administered 2022-02-15 (×2): 10 mg via ORAL
  Filled 2022-02-15 (×5): qty 2

## 2022-02-15 MED ORDER — LORAZEPAM 1 MG PO TABS: 1.0000 mg | ORAL_TABLET | ORAL | Status: AC | PRN

## 2022-02-15 NOTE — Assessment & Plan Note (Signed)
Attribute mild transaminitis secondary to alcohol abuse.

## 2022-02-15 NOTE — Assessment & Plan Note (Addendum)
Due to to alcohol intoxication and withdrawal.  May require transfer to ICU for Precedex if not controlled with CIWA

## 2022-02-15 NOTE — Hospital Course (Addendum)
This 81 y.o. male with medical history significant of COPD, alcohol abuse, GERD, hypertension, sleep apnea admitted for alcohol withdrawal.  2/21: Actively withdrawing on CIWA protocol. 2/22: Patient remains sedated on Precedex drip. 2/23: Patient still remains on Precedex drip, alert and following commands. 2/24: Successfully off Precedex.  Patient alert, oriented x1 ,  still confused. 2/25: Off precedex, oriented x3 2/26: Agitated and confused again, started phenobarbital 2/27: Some agitation last night, but more sleepy and without agitation today, RASS -2 to -1  On 3/1 through 3/3 patient's mental status much improved and doing better with physical therapy.  Stable for disposition once we find a appropriate place.

## 2022-02-15 NOTE — Assessment & Plan Note (Signed)
CPAP per home settings.   

## 2022-02-15 NOTE — Assessment & Plan Note (Addendum)
Troponins negative. Patient had a left heart catheterization in 2022 December results as follows: Left heart cath done by Dr. Nehemiah Massed. 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

## 2022-02-15 NOTE — Assessment & Plan Note (Addendum)
Continue Cardizem, Imdur.  Adjust medicine as needed for better blood pressure control

## 2022-02-15 NOTE — Assessment & Plan Note (Addendum)
Sliding scale for now

## 2022-02-15 NOTE — TOC Initial Note (Signed)
Transition of Care [facility]) - Initial/Assessment Note    Patient Details  Name: Brett Blake MRN: [id number] Date of Birth: [birth year]  Transition of Care [facility] CM/SW Contact:    Candie Chroman, LCSW Phone Number: 02/15/2022, 2:41 PM  Clinical Narrative:  MD said that wife was requesting information on AA meetings. CSW printed off meeting list for [facility] and provided to wife at bedside. Patient sleeping. Wife wants him to detox in an inpatient facility. Explained that [facility] CSW evaluated him this morning and he was not interested in that. Wife worried she may not be safe at home if he's discharged before detoxed. She said he likely won't discharge for another couple of days. Explained that he will be given medication here for withdrawal and will hopefully not have any further withdrawal symptoms at discharge. No further concerns. CSW encouraged patient's wife to contact CSW as needed. CSW will continue to follow patient and his wife for support and facilitate return home when stable.  Expected Discharge Plan: Home/Self Care Barriers to Discharge: Continued Medical Work up   Patient Goals and CMS Choice        Expected Discharge Plan and Services Expected Discharge Plan: Home/Self Care     Post Acute Care Choice: NA Living arrangements for the past 2 months: Single Family Home                                      Prior Living Arrangements/Services Living arrangements for the past 2 months: Single Family Home Lives with:: Spouse Patient language and need for interpreter reviewed:: Yes Do you feel safe going back to the place where you live?: Yes      Need for Family Participation in Patient Care: Yes (Comment) Care giver support system in place?: Yes (comment)   Criminal Activity/Legal Involvement Pertinent to Current Situation/Hospitalization: No - Comment as needed  Activities of Daily Living      Permission Sought/Granted Permission sought to share  information with : Family Supports    Share Information with NAME: Neyland Pettengill     Permission granted to share info w Relationship: Wife  Permission granted to share info w Contact Information: [phone]  Emotional Assessment Appearance:: Appears stated age Attitude/Demeanor/Rapport: Unable to Assess Affect (typically observed): Unable to Assess Orientation: : Oriented to Self, Oriented to Place Alcohol / Substance Use: Alcohol Use Psych Involvement: No (comment)  Admission diagnosis:  Cough [R05.9] Lactic acidosis [E87.20] 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

## 2022-02-15 NOTE — Assessment & Plan Note (Signed)
Patient continued on albuterol, Flonase, Singulair, Dulera.

## 2022-02-15 NOTE — Assessment & Plan Note (Addendum)
Seizure precautions.  He is at high risk for seizure due to alcohol withdrawal

## 2022-02-15 NOTE — BH Assessment (Addendum)
Comprehensive Clinical Assessment (CCA) Note  02/15/2022 Brett Blake [id number] Recommendations for Services/Supports/Treatments: Pt was recommended for inpatient detox substance abuse treatment; however, pt declined detox/substance abuse treatment. Pt was provided with supportive therapy, and was encouraged to follow up with a psychiatrist if he is not willing to pursue inpatient detox.   Brett Blake is a 81 year old, English speaking, Caucasian male with a history of alcohol abuse. Per triage note, Pt here with SOB and EtOH from [facility]. Pt was found on floor by staffing yelling out" I can't breathe" and "just let me die, I'm a Christian". Upon assessment pt. denied using alcohol, daily. The pt. minimized his drinking behavior reporting that he doesn't really like drinking, and only uses it to fall asleep or as a decongestant. Pt was able to identify his wife and his doctor's concerns, but explained that he does not desire detox. The pt. denied having a problem despite having a BAL of 271. Pt reported that he lives with his wife. Pt identified his main stressors as the recent passing away of his daughter's boyfriend, sleep disturbance, and his declining health. Pt began crying hysterically when discussing the death of his daughter's boyfriend. Pt was visibly anxious and tearful throughout the assessment. The pt. had slowed psychomotor activity. The pt. is not connected to any services. Pt did not appear to be responding to internal stimuli. Pt had slurred speech, but was fairly lucid. Pt presented with a depressed mood; affect was tearful. Pt denied current SI/HI/AV/H.   Chief Complaint:  Chief Complaint  Patient presents with   Shortness of Breath   Alcohol Intoxication   Visit Diagnosis: Alcohol use disorder, severe    CCA Screening, Triage and Referral (STR)  Patient Reported Information How did you hear about [place]? Family/Friend  Referral name: No data recorded Referral phone number: No  data recorded  Whom do you see for routine medical problems? No data recorded Practice/Facility Name: No data recorded Practice/Facility Phone Number: No data recorded Name of Contact: No data recorded Contact Number: No data recorded Contact Fax Number: No data recorded Prescriber Name: No data recorded Prescriber Address (if known): No data recorded  What Is the Reason for Your Visit/Call Today? Pt presented to [facility] ED with SOB and etoh from [facility]. Pt was found on floor by staffing yelling out" I can't breathe" and "just let me die, I'm a Christian".  How Long Has This Been Causing You Problems? 1 wk - 1 month  What Do You Feel Would Help You the Most Today? Alcohol or Drug Use Treatment; Treatment for Depression or other mood problem   Have You Recently Been in Any Inpatient Treatment (Hospital/Detox/Crisis Center/28-Day Program)? No data recorded Name/Location of Program/Hospital:No data recorded How Long Were You There? No data recorded When Were You Discharged? No data recorded  Have You Ever Received Services From [facility] Before? No data recorded Who Do You See at [facility]? No data recorded  Have You Recently Had Any Thoughts About Hurting Yourself? No  Are You Planning to Commit Suicide/Harm Yourself At This time? No   Have you Recently Had Thoughts About [place]? No  Explanation: No data recorded  Have You Used Any Alcohol or Drugs in the Past 24 Hours? Yes  How Long Ago Did You Use Drugs or Alcohol? No data recorded What Did You Use and How Much? Whiskey; unknown amount   Do You Currently Have a Therapist/Psychiatrist? No  Name of Therapist/Psychiatrist: No data recorded  Have You Been Recently Discharged From Any Office Practice or Programs? No  Explanation of Discharge From Practice/Program: No data recorded    CCA Screening Triage Referral Assessment Type of Contact: Face-to-Face  Is this Initial or Reassessment? No data  recorded Date Telepsych consult ordered in CHL:  No data recorded Time Telepsych consult ordered in CHL:  No data recorded  Patient Reported Information Reviewed? No data recorded Patient Left Without Being Seen? No data recorded Reason for Not Completing Assessment: No data recorded  Collateral Involvement: None provided   Does Patient Have a [place]? No data recorded Name and Contact of Legal Guardian: No data recorded If Minor and Not Living with Parent(s), Who has Custody? n/a  Is CPS involved or ever been involved? Never  Is APS involved or ever been involved? Never   Patient Determined To Be At Risk for Harm To Self or Others Based on Review of Patient Reported Information or Presenting Complaint? No  Method: No data recorded Availability of Means: No data recorded Intent: No data recorded Notification Required: No data recorded Additional Information for Danger to Others Potential: No data recorded Additional Comments for Danger to Others Potential: No data recorded Are There Guns or Other Weapons in Your Home? No data recorded Types of Guns/Weapons: No data recorded Are These Weapons Safely Secured?                            No data recorded Who Could Verify You Are Able To Have These Secured: No data recorded Do You Have any Outstanding Charges, Pending Court Dates, Parole/Probation? No data recorded Contacted To Inform of Risk of Harm To Self or Others: No data recorded  Location of Assessment: [facility] ED   Does Patient Present under Involuntary Commitment? No  IVC Papers Initial File Date: No data recorded  [place] of Residence: [place]   Patient Currently Receiving the Following Services: Not Receiving Services   Determination of Need: Emergent (2 hours)   Options For Referral: Therapeutic Triage Services     CCA Biopsychosocial Intake/Chief Complaint:  No data recorded Current Symptoms/Problems: No data recorded  Patient  Reported Schizophrenia/Schizoaffective Diagnosis in Past: No   Strengths: n/a  Preferences: No data recorded Abilities: No data recorded  Type of Services Patient Feels are Needed: No data recorded  Initial Clinical Notes/Concerns: No data recorded  Mental Health Symptoms Depression:   Worthlessness; Tearfulness; Sleep (too much or little)   Duration of Depressive symptoms:  Greater than two weeks   Mania:   None   Anxiety:    Worrying; Tension; Irritability   Psychosis:   None   Duration of Psychotic symptoms: No data recorded  Trauma:   N/A   Obsessions:   None   Compulsions:   Intended to reduce stress or prevent another outcome; Intrusive/time consuming; "Driven" to perform behaviors/acts   Inattention:   None   Hyperactivity/Impulsivity:   None   Oppositional/Defiant Behaviors:   None   Emotional Irregularity:   N/A   Other Mood/Personality Symptoms:  No data recorded   Mental Status Exam Appearance and self-care  Stature:   Average   Weight:   Overweight   Clothing:   Casual   Grooming:   Neglected   Cosmetic use:   None   Posture/gait:   Normal   Motor activity:   Not Remarkable   Sensorium  Attention:   Normal   Concentration:   Normal  Orientation:   Situation; Time; Place; Person; Object   Recall/memory:   Normal   Affect and Mood  Affect:   Depressed   Mood:   Depressed   Relating  Eye contact:   Normal   Facial expression:   Depressed   Attitude toward examiner:   Cooperative   Thought and Language  Speech flow:  Slurred   Thought content:   Appropriate to Mood and Circumstances   Preoccupation:   None   Hallucinations:   None   Organization:  No data recorded  [employer] of Knowledge:   Average   Intelligence:   Average   Abstraction:   Normal   Judgement:   Impaired   Reality Testing:   Distorted   Insight:   Denial   Decision Making:   Vacilates    Social Functioning  Social Maturity:   Impulsive   Social Judgement:   Normal   Stress  Stressors:   Grief/losses   Coping Ability:   Exhausted; Deficient supports   Skill Deficits:   Decision making   Supports:   Family; Support needed     Religion: Religion/Spirituality Are You A Religious Person?: Yes What is Your Religious Affiliation?: Christian  Leisure/Recreation: Leisure / Recreation Do You Have Hobbies?: No  Exercise/Diet: Exercise/Diet Do You Exercise?: No Have You Gained or Lost A Significant Amount of Weight in the Past Six Months?: No Do You Follow a Special Diet?: No Do You Have Any Trouble Sleeping?: Yes Explanation of Sleeping Difficulties: Pt reported that he has issues with sleep and can only sleep after consuming alcohol.   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, government has Been Impacted by Current Illness: No Has Patient ever Been in Passenger transport manager?: No  Education: Education Is Patient Currently Attending School?: No Did Physicist, medical?: No Did You Have An Individualized Education Program (IIEP): No Did You Have Any Difficulty At [employer]?: No Patient's Education Has Been Impacted by Current Illness: No   CCA Family/Childhood History Family and Relationship History: Family history Marital status: Married Number of Years Married:  (Not assessed) What types of issues is patient dealing with in the relationship?: None Additional relationship information: Pt's wife is concerned about the pt's ETOH consumption. Does patient have children?: Yes How many children?: 1 How is patient's relationship with their children?: Pt reported having a close relationship with his daughter.  Childhood History:  Childhood History By whom was/is the patient raised?: Both parents Did patient suffer any verbal/emotional/physical/sexual abuse as a child?: No Did patient  suffer from severe childhood neglect?: No Has patient ever been sexually abused/assaulted/raped as an adolescent or adult?: No Was the patient ever a victim of a crime or a disaster?: No Witnessed domestic violence?: No Has patient been affected by domestic violence as an adult?: No  Child/Adolescent Assessment:     CCA Substance Use Alcohol/Drug Use: Alcohol / Drug Use Pain Medications: See MAR Prescriptions: See MAR Over the Counter: See MAR History of alcohol / drug use?: Yes Longest period of sobriety (when/how long): Unknown Negative Consequences of Use: Personal relationships Withdrawal Symptoms: None Substance #1 Name of Substance 1: Alcohol 1 - Age of First Use: Unknown 1 - Amount (size/oz): 1/2 gallon 1 - Frequency: Daily 1 - Duration: Unknown 1 - Last Use / Amount: 02/14/22                       ASAM's:  Six Dimensions of Multidimensional Assessment  Dimension 1:  Acute Intoxication and/or Withdrawal Potential:   Dimension 1:  Description of individual's past and current experiences of substance use and withdrawal: Pt has a hx of chronic alcohol abuse  Dimension 2:  Biomedical Conditions and Complications:      Dimension 3:  Emotional, Behavioral, or Cognitive Conditions and Complications:     Dimension 4:  Readiness to Change:     Dimension 5:  Relapse, Continued use, or Continued Problem Potential:     Dimension 6:  Recovery/Living Environment:     ASAM Severity Score: ASAM's Severity Rating Score: 16  ASAM Recommended Level of Treatment: ASAM Recommended Level of Treatment: Level III Residential Treatment   Substance use Disorder (SUD) Substance Use Disorder (SUD)  Checklist Symptoms of Substance Use: Continued use despite having a persistent/recurrent physical/psychological problem caused/exacerbated by use, Continued use despite persistent or recurrent social, interpersonal problems, caused or exacerbated by use, Evidence of  tolerance  Recommendations for Services/Supports/Treatments: Recommendations for Services/Supports/Treatments Recommendations For Services/Supports/Treatments: Detox, Inpatient Hospitalization  DSM5 Diagnoses: Patient Active Problem List   Diagnosis Date Noted   COPD (chronic obstructive pulmonary disease) ([place]) 02/14/2022   GERD (gastroesophageal reflux disease) 02/14/2022   Cough 02/14/2022   Abnormal LFTs 02/14/2022   AMS (altered mental status) 02/14/2022   Unstable angina ([place]) 12/11/2021   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[place]) 11/17/2021   Sepsis ([place]) 05/20/2021   Acute gastroenteritis 05/20/2021   Chronic alcohol abuse 04/20/2021   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

## 2022-02-15 NOTE — Assessment & Plan Note (Signed)
Alcohol level of 270, alcohol withdrawal protocol. IV PPI therapy. Magnesium replacement.

## 2022-02-15 NOTE — Progress Notes (Addendum)
Progress Note   Patient: Brett Blake ZYS:[id number] DOB: 12-Nov-1941 DOA: 02/14/2022     1 DOS: the patient was seen and examined on 02/15/2022   Brief hospital course: 81 y.o. male with medical history significant of COPD, alcohol abuse, GERD, hypertension, sleep apnea admitted for alcohol withdrawal  2/21: Actively withdrawing on CIWA protocol   Assessment and Plan: * Alcohol abuse with withdrawal ([place])- (present on admission) On CIWA protocol.  Appreciate psych input.  Alcohol level 270  AMS (altered mental status)- (present on admission) Due to to alcohol intoxication and withdrawal.  May require transfer to ICU for Precedex if not controlled with CIWA   Abnormal LFTs- (present on admission) Attribute mild transaminitis secondary to alcohol abuse.   Cough- (present on admission) CTA evaluation of chest negative for any infiltrates consolidation or effusion. Symptomatic management  GERD (gastroesophageal reflux disease)- (present on admission) IV PPI therapy continued.  COPD (chronic obstructive pulmonary disease) ([place])- (present on admission) Patient continued on albuterol, Flonase, Singulair, Dulera.  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[place])- (present on admission) Patient continued on donepezil.  Pseudoseizures ([place]) Seizure precautions.  He is at high risk for seizure due to alcohol withdrawal  Sleep apnea- (present on admission) CPAP per home settings.  Chest pain- (present on admission) Troponins negative. Patient had a left heart catheterization in 2022 December results as follows: Left heart cath done by Dr. Nehemiah Massed. 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 IV PPI therapy. Magnesium replacement.   Wife shared he was under AA for long.  He was doing well and not drinking For last several weeks he started drinking again.  He has been drinking heavily since last Friday according to wife     Subjective: Very agitated and confused  Physical Exam: Vitals:   02/14/22 2336 02/15/22 0324 02/15/22 0824 02/15/22 1201  BP: (!) 157/99 (!) 174/103 (!) 164/93 109/71  Pulse: (!) 116 (!) 102 71 (!) 109  Resp: 16  18 19   Temp: 98.3 F (36.8 C) 97.9 F (36.6 C) 97.9 F (36.6 C) (!) 97.4 F (36.3 C)  TempSrc:      SpO2: 96% 99% 99% 97%  Weight: 76.2 kg     Height:       81 year old male lying in the bed agitated and confused Eyes pupil equal round reactive to light accommodation Lungs clear to auscultation bilaterally, no wheezing rales rhonchi or crepitation Cardiovascular S1-S2 normal, no murmur rales or gallop Abdomen soft, benign Skin no rash or lesion Neuro patient is awake but disoriented and confused, agitated  Data Reviewed:  Lactic acid 4.7  Family Communication: Wife updated over phone  Disposition: Status is: Inpatient Remains inpatient appropriate because: Alcohol withdrawal   He is actively withdrawing and may require Precedex drip in the ICU if not controlled with CIWA protocol on the floor.  He is at high risk for seizure and worsening clinical condition  DVT prophylaxis subcu heparin and SCDs      Planned Discharge Destination: Home     Time spent: 35 minutes  Author: Max Sane, MD 02/15/2022 12:48 PM  For on call review [email or web address].

## 2022-02-15 NOTE — Assessment & Plan Note (Signed)
IV PPI therapy continued.

## 2022-02-15 NOTE — Assessment & Plan Note (Addendum)
CTA evaluation of chest negative for any infiltrates consolidation or effusion. Symptomatic management

## 2022-02-15 NOTE — Assessment & Plan Note (Signed)
Patient continued on donepezil.

## 2022-02-15 NOTE — Progress Notes (Signed)
Patient states he does wear a machine at home but not all the time. He states he does not want to wear a hospital unit, because it may dry him out and make him nervous

## 2022-02-15 NOTE — Assessment & Plan Note (Addendum)
On CIWA protocol.  Appreciate psych input.  Alcohol level 270

## 2022-02-16 DIAGNOSIS — F10139 Alcohol abuse with withdrawal, unspecified: Secondary | ICD-10-CM | POA: Diagnosis not present

## 2022-02-16 LAB — GLUCOSE, CAPILLARY
Glucose-Capillary: 138 mg/dL — ABNORMAL HIGH (ref 70–99)
Glucose-Capillary: 146 mg/dL — ABNORMAL HIGH (ref 70–99)
Glucose-Capillary: 146 mg/dL — ABNORMAL HIGH (ref 70–99)
Glucose-Capillary: 173 mg/dL — ABNORMAL HIGH (ref 70–99)
Glucose-Capillary: 176 mg/dL — ABNORMAL HIGH (ref 70–99)
Glucose-Capillary: 220 mg/dL — ABNORMAL HIGH (ref 70–99)

## 2022-02-16 LAB — MRSA NEXT GEN BY PCR, NASAL: MRSA by PCR Next Gen: NOT DETECTED

## 2022-02-16 LAB — HEMOGLOBIN A1C
Hgb A1c MFr Bld: 5.9 % — ABNORMAL HIGH (ref 4.8–5.6)
Mean Plasma Glucose: 123 mg/dL

## 2022-02-16 MED ORDER — LORAZEPAM 2 MG/ML IJ SOLN
4.0000 mg | Freq: Once | INTRAMUSCULAR | Status: AC
  Administered 2022-02-16: 4 mg via INTRAMUSCULAR
  Filled 2022-02-16: qty 2

## 2022-02-16 MED ORDER — MIDAZOLAM HCL 2 MG/2ML IJ SOLN: 2.0000 mg | Freq: Once | INTRAMUSCULAR | Status: AC

## 2022-02-16 MED ORDER — DEXMEDETOMIDINE BOLUS VIA INFUSION
1.0000 ug/kg | Freq: Once | INTRAVENOUS | Status: DC
  Filled 2022-02-16: qty 77

## 2022-02-16 MED ORDER — CHLORHEXIDINE GLUCONATE CLOTH 2 % EX PADS
6.0000 | MEDICATED_PAD | Freq: Every day | CUTANEOUS | Status: DC
  Administered 2022-02-16 – 2022-02-24 (×8): 6 via TOPICAL

## 2022-02-16 MED ORDER — DEXMEDETOMIDINE HCL IN NACL 400 MCG/100ML IV SOLN
0.4000 ug/kg/h | INTRAVENOUS | Status: DC
  Administered 2022-02-16 (×2): 1.2 ug/kg/h via INTRAVENOUS
  Administered 2022-02-16: 0.4 ug/kg/h via INTRAVENOUS
  Administered 2022-02-16: 1.1 ug/kg/h via INTRAVENOUS
  Administered 2022-02-16 – 2022-02-17 (×5): 1.2 ug/kg/h via INTRAVENOUS
  Administered 2022-02-17: 0.4 ug/kg/h via INTRAVENOUS
  Administered 2022-02-17: 1.2 ug/kg/h via INTRAVENOUS
  Administered 2022-02-18 – 2022-02-19 (×3): 0.4 ug/kg/h via INTRAVENOUS
  Administered 2022-02-19: 0.9 ug/kg/h via INTRAVENOUS
  Filled 2022-02-16 (×12): qty 100

## 2022-02-16 MED ORDER — LORAZEPAM 1 MG PO TABS: 0.0000 mg | ORAL_TABLET | Freq: Four times a day (QID) | ORAL | Status: AC

## 2022-02-16 MED ORDER — LORAZEPAM 2 MG/ML IJ SOLN: 0.0000 mg | Freq: Four times a day (QID) | INTRAMUSCULAR | Status: AC

## 2022-02-16 MED ORDER — HYDRALAZINE HCL 20 MG/ML IJ SOLN
10.0000 mg | Freq: Four times a day (QID) | INTRAMUSCULAR | Status: DC | PRN
  Administered 2022-02-16 – 2022-02-20 (×2): 10 mg via INTRAVENOUS
  Filled 2022-02-16 (×2): qty 1

## 2022-02-16 MED ORDER — MIDAZOLAM HCL 2 MG/2ML IJ SOLN
INTRAMUSCULAR | Status: AC
  Administered 2022-02-16: 2 mg via INTRAVENOUS
  Filled 2022-02-16: qty 2

## 2022-02-16 NOTE — Assessment & Plan Note (Addendum)
Hemoglobin A1c 5.9.  Can watch with diet control at this point.

## 2022-02-16 NOTE — Assessment & Plan Note (Addendum)
Hold donepezil for now.  I prescribed low-dose Seroquel and melatonin at night to help out with sleep.  The patient has been sleeping well the last few nights.  Explained to patient's wife that Seroquel does have a black box warning and hopefully only needed for short period of time.  Seroquel 25 mg nightly prescribed and can take an extra dose if still having trouble sleeping.

## 2022-02-16 NOTE — Assessment & Plan Note (Addendum)
EEG negative, MRI normal.

## 2022-02-16 NOTE — Progress Notes (Signed)
° °      CROSS COVER NOTE  NAME: Brett Blake MRN: [id number] DOB : October 18, 1941  Secure chat received from nursing reporting patient agitation and aggression. Reportedly patient forced his way almost off of the unit and pushed staff out of his way and is unable to be redirected. Granddaughter at bedside also reports that patient swung at her tonight.    Altered Mental Status/ ETOH withdrawal - Precedex bolus + drip - Transfer to stepdown    0300: Patient pulled IV; IM Ativan ordered while waiting for IV team to replace IV for precedex. Patient again attempting to get out of bed, pull at equipment, and kick at staff. Nursing staff initiated a manual hold at 0302 and called security to bedside.  0330: 2mg  IV versed ordered  Josephina Shih, MSN, FNP-BC Nurse Practitioner [facility] Pager [phone]

## 2022-02-16 NOTE — Progress Notes (Signed)
Patient extremely agitated and verbally abusive to staff and granddaughter at bedside. Staff members at bedside to help calm and reorient patient. PRN Ativan given for CIWA score of 26. Neomia Glass, NP notified, security called. Patient did calm down for about 20 minutes, but then became agitated once again. Valetta Fuller, NP on floor and notified again. Orders for transfer to ICU.

## 2022-02-16 NOTE — Assessment & Plan Note (Addendum)
Continue albuterol,  Singulair, Brett Blake

## 2022-02-16 NOTE — Assessment & Plan Note (Addendum)
Continue nocturnal CPAP

## 2022-02-16 NOTE — Assessment & Plan Note (Addendum)
This was acute metabolic encephalopathy due to delirium tremens, in setting of dementia.

## 2022-02-16 NOTE — Assessment & Plan Note (Addendum)
CT angio of the chest did not show any pulmonary embolism.

## 2022-02-16 NOTE — Progress Notes (Signed)
°  Progress Note   Patient: Brett Blake VPX:[id number] DOB: [birth year] DOA: 02/14/2022     2 DOS: the patient was seen and examined on 02/16/2022   Brief hospital course: This 81 y.o. male with medical history significant of COPD, alcohol abuse, GERD, hypertension, sleep apnea admitted for alcohol withdrawal.  2/21: Actively withdrawing on CIWA protocol. 2/22: Patient remains sedated on Precedex drip.  Assessment and Plan: * Alcohol abuse with withdrawal ([place])- (present on admission) Patient remained on CIWA protocol.  Appreciate psych input.   Alcohol level 270.  He continued to remain agitated and restless. Transferred to ICU and started on Precedex gtt.  AMS (altered mental status)- (present on admission) Due to to alcohol intoxication and withdrawal.   Patient transferred to ICU started Precedex gtt. for high CIWA score.  Abnormal LFTs- (present on admission) Attribute mild transaminitis secondary to alcohol abuse.   Cough- (present on admission) CTA evaluation of chest negative for any infiltrates consolidation or effusion. Symptomatic management  GERD (gastroesophageal reflux disease)- (present on admission) Continue IV PPI therapy.  COPD (chronic obstructive pulmonary disease) ([place])- (present on admission) Continue albuterol, Flonase, Singulair, Dulera.  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  He is at high risk for seizure due to alcohol withdrawal  Sleep apnea- (present on admission) Continue CPAP as per home settings.  Chest pain- (present on admission) Patient had LHC in 2022, done by Dr. Nehemiah Massed. 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 This seems noncardiac chest pain, troponin negative.  Essential hypertension- (present on  admission) Continue Cardizem and Imdur. Consider hydralazine as needed for SBP above 170.  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  Chronic alcohol abuse-resolved as of 02/15/2022, (present on admission) Alcohol level of 270, alcohol withdrawal protocol. IV PPI therapy. Magnesium replacement.    Subjective: Patient was seen and examined at bedside.  Overnight events noted. Patient remains sedated because of being on Precedex gtt.  Vitals stable.  Physical Exam: Vitals:   02/16/22 1200 02/16/22 1300 02/16/22 1400 02/16/22 1500  BP: (!) 143/81 131/87 (!) 143/90 (!) 146/92  Pulse: 90 91 88 88  Resp: (!) 24 (!) 22 20 (!) 21  Temp: 98 F (36.7 C)     TempSrc: Axillary     SpO2: 100% 100% 100% 100%  Weight:      Height:       General exam: Sedated, lying comfortably, not in any acute distress. Respiratory system: CTA bilaterally, no wheezing, no crackles. Cardiovascular system: S1-S2 heard, regular rate and rhythm, no murmur. Gastrointestinal system: .  Abdomen is soft, nontender, nondistended, BS+ Central nervous system: Sedated because of Precedex gtt. Extremities: No edema, no cyanosis, no clubbing. Psychiatry: Not assessed   Data Reviewed: I have Reviewed nursing notes, Vitals, and Lab results since pt's last encounter. Pertinent lab results CBC CMP I have ordered test including CBC CMP I have reviewed the last note from ICU,  I have discussed pt's care plan and test results with ICU.   Family Communication: No family at bedside  Disposition: Status is: Inpatient Remains inpatient appropriate because: Admitted for alcohol withdrawal requiring Precedex gtt. for alcohol withdrawal.   Planned Discharge Destination: Home  Time spent: 50 minutes  Author: Shawna Clamp, MD 02/16/2022 3:31 PM  For on call review [email or web address].

## 2022-02-16 NOTE — Assessment & Plan Note (Addendum)
Continue PPI ?

## 2022-02-16 NOTE — Assessment & Plan Note (Addendum)
Continue Cardizem, Imdur and propranolol

## 2022-02-16 NOTE — Assessment & Plan Note (Addendum)
This has resolved.  The patient had a prolonged hospital course.  Patient had high-dose IV thiamine and now switched over to oral thiamine multivitamin and folic acid upon discharge

## 2022-02-16 NOTE — Progress Notes (Addendum)
~  0200 pt arrived to unit from 2A. L PIV infiltrated, unable to start dex gtt. IV team paged after 2 RN attempts x2.   ~0240: Pt became increasingly agitated at staff and family, swearing and attempting to get out of bed despite redirection. Pt making verbal and physical threats by swinging/biting at staff. CIWA 30. NP [place] notifed. Security called and at bedside, performed physical hold, mitts placed, IM 4mg  ativan and IM 2mg  versed given and precedex gtt started once IV access established.   ANOx1. VSS. Afebrile. Tolerating precedex gtt well. RASS -2.

## 2022-02-17 DIAGNOSIS — F10139 Alcohol abuse with withdrawal, unspecified: Secondary | ICD-10-CM | POA: Diagnosis not present

## 2022-02-17 LAB — GLUCOSE, CAPILLARY
Glucose-Capillary: 157 mg/dL — ABNORMAL HIGH (ref 70–99)
Glucose-Capillary: 104 mg/dL — ABNORMAL HIGH (ref 70–99)
Glucose-Capillary: 128 mg/dL — ABNORMAL HIGH (ref 70–99)
Glucose-Capillary: 98 mg/dL (ref 70–99)

## 2022-02-17 MED ORDER — PANTOPRAZOLE SODIUM 40 MG PO TBEC: 40.0000 mg | DELAYED_RELEASE_TABLET | Freq: Two times a day (BID) | ORAL | Status: DC

## 2022-02-17 NOTE — Progress Notes (Signed)
Progress Note   Patient: Brett Blake FUX:[id number] DOB: [birth year] DOA: 02/14/2022     3 DOS: the patient was seen and examined on 02/17/2022   Brief hospital course: This 81 y.o. male with medical history significant of COPD, alcohol abuse, GERD, hypertension, sleep apnea admitted for alcohol withdrawal.  2/21: Actively withdrawing on CIWA protocol. 2/22: Patient remains sedated on Precedex drip. 2/23: Patient still remains on Precedex drip, alert and following commands.  Assessment and Plan: * Alcohol abuse with withdrawal ([place])- (present on admission) Patient remained on CIWA protocol.  Appreciate psych input.   Alcohol level 270.  He continued to remain agitated and restless. Transferred to ICU and started on Precedex gtt.  AMS (altered mental status)- (present on admission) Due to to alcohol intoxication and withdrawal.   Patient transferred to ICU started Precedex gtt. for high CIWA score.  Abnormal LFTs- (present on admission) Attribute mild transaminitis secondary to alcohol abuse.   Cough- (present on admission) CTA evaluation of chest negative for any infiltrates consolidation or effusion. Symptomatic management  GERD (gastroesophageal reflux disease)- (present on admission) Continue IV PPI therapy.  COPD (chronic obstructive pulmonary disease) ([place])- (present on admission) Continue albuterol, Flonase, Singulair, Dulera.  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  He is at high risk for seizure due to alcohol withdrawal  Sleep apnea- (present on admission) Continue CPAP as per home settings.  Chest pain- (present on admission) Patient had LHC in 2022, done by Dr. Nehemiah Massed. 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 This seems noncardiac  chest pain, troponin negative.  Essential hypertension- (present on admission) Continue Cardizem and Imdur. Consider hydralazine as needed for SBP above 170.  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  Chronic alcohol abuse-resolved as of 02/15/2022, (present on admission) Alcohol level of 270, alcohol withdrawal protocol. IV PPI therapy. Magnesium replacement.    Subjective: Patient was seen and examined at bedside.  Overnight events noted. Patient was alert and awake but has intermittent agitation and restlessness.   Patient remains in soft restraints. Patient still remains on Precedex drip.  Physical Exam: Vitals:   02/17/22 1200 02/17/22 1300 02/17/22 1400 02/17/22 1413  BP: 122/83 (!) 142/88 (!) 141/85 (!) 141/85  Pulse: 87 87 88 88  Resp: (!) 21 20 (!) 30   Temp: 97.6 F (36.4 C)  97.8 F (36.6 C)   TempSrc: Oral  Oral   SpO2: 100% 99% 100%   Weight:      Height:       General exam: Appears comfortable, confused, deconditioned, not in any distress. Respiratory system: CTA bilaterally, no wheezing, no crackles. Cardiovascular system: S1-S2 heard, regular rate and rhythm, no murmur. Gastrointestinal system: .  Abdomen is soft, nontender, nondistended, BS+ Central nervous system: Slightly sedated, open eyes on calling name. Extremities: No edema, no cyanosis, no clubbing. Psychiatry: Not assessed   Data Reviewed: I have Reviewed nursing notes, Vitals, and Lab results since pt's last encounter. Pertinent lab results CBC CMP I have ordered test including CBC CMP I have reviewed the last note from ICU,  I have discussed pt's care plan and test results with ICU.   Family Communication: Wife at bedside  Disposition: Status is: Inpatient Remains inpatient appropriate because: Admitted for alcohol withdrawal requiring Precedex gtt. for alcohol withdrawal.   Planned Discharge Destination: Home  Time spent: 50  minutes  Author: Shawna Clamp, MD 02/17/2022 3:44 PM  For on call review [email or web address].

## 2022-02-17 NOTE — Progress Notes (Signed)
PHARMACIST - PHYSICIAN COMMUNICATION  CONCERNING: IV to Oral Route Change Policy  RECOMMENDATION: This patient is receiving pantoprazole by the intravenous route.  Based on criteria approved by the Pharmacy and Therapeutics Committee, the intravenous medication(s) is/are being converted to the equivalent oral dose form(s).   DESCRIPTION: These criteria include: The patient is eating (either orally or via tube) and/or has been taking other orally administered medications for a least 24 hours The patient has no evidence of active gastrointestinal bleeding or impaired GI absorption (gastrectomy, short bowel, patient on TNA or NPO).  If you have questions about this conversion, please contact the [place], [facility] 02/17/2022 1:50 PM

## 2022-02-17 NOTE — Progress Notes (Signed)
Patient repeatedly yelling that "he wants to die," asking for [place] to "just kill him." Per ED note, along with cough/SOB this is why patient wife brought him to the hospital. Patient has also threatened bodily harm towards staff. Morton Amy, NP made aware of patient statements. Will continue to monitor.

## 2022-02-17 NOTE — Progress Notes (Signed)
Able to titrate precedex gtt off. Pt has remained calm and redirectable since this am. Pt still confused and only alert to person, but able to follow commands. Will continue to monitor.

## 2022-02-17 NOTE — TOC Progression Note (Signed)
Transition of Care [facility]) - Progression Note    Patient Details  Name: Brett Blake MRN: [id number] Date of Birth: 1941-01-02  [facility]) CM/SW Contact  Shelbie Hutching, RN Phone Number: 02/17/2022, 1:31 PM  Clinical Narrative:    Patient's wife Brett Blake reached out to [facility] today to discuss where her husband will go at discharge.  Informed wife that it is a little too early to discuss that while he is in the ICU going through Alcohol withdrawal.  Brett Blake wants him to go for inpatient alcohol abuse treatment, she reports Dr. [name] told her that's what the patient needs. Informed Brett Blake that we can discuss this with patient but if he does not agree to go we cannot force him.  If patient chooses to go home he will be discharged home when medically ready.   She says she doesn't feel safe with him- informed her that she would need to file a restraining order if that was the case because she will not be able to keep him out of their shared home.  TOC discusses following up with this discussion when patient is more clear, wife verbalizes understanding.     Expected Discharge Plan: Home/Self Care Barriers to Discharge: Continued Medical Work up  Expected Discharge Plan and Services Expected Discharge Plan: Home/Self Care     Post Acute Care Choice: NA Living arrangements for the past 2 months: Single Family Home                                       Social Determinants of Health (SDOH) Interventions    Readmission Risk Interventions No flowsheet data found.

## 2022-02-17 NOTE — Progress Notes (Signed)
° °      CROSS COVER NOTE  NAME: Brett Blake MRN: [id number] DOB : 14-Aug-1941   Nursing contacted me to report that patient is articulating that he'd like to "blow his brains out".  C-SSRS Moderate Risk  On my bedside assessment Brett Blake is sedated on Precedex and is unable to tell me about his suicidal ideation, plan, or intent. When I ask him yes/no questions he does endorse that he would like to blow his brains out.   Will place on suicide precautions and 1:1 monitoring. Recommend psych consultation when patient is not sedated and able to participate in interview.   Neomia Glass MHA, MSN, FNP-BC Nurse Practitioner [facility] Pager [phone]

## 2022-02-17 NOTE — Progress Notes (Signed)
Received report and assumed patient care. Pt oriented only to self, does not reliably follow simply commands, moves all extremities. VSS. Reported to only have 56ml UOP since shift change. 244ml amber colored urine in dedicated suction canister. Will continue to monitor.

## 2022-02-17 NOTE — Progress Notes (Signed)
Shift summary: Patient placed on moderate suicide precautions. He continues to make suicidal statements about "wanting to cut his throat," and "busting his head open." Arousable and oriented to person only. Difficult to redirect. Hollers out illogical thoughts and attempts to get OOB, combative with staff. Pressured speech and disorganized thinking, stating that "we all say we want to kill ourselves." Floor mats in place. 4mg  Ativan given. Precedex at max rate. 500 UOP via external catheter. Bed alarm set.

## 2022-02-18 DIAGNOSIS — F10139 Alcohol abuse with withdrawal, unspecified: Secondary | ICD-10-CM | POA: Diagnosis not present

## 2022-02-18 LAB — BASIC METABOLIC PANEL
Anion gap: 13 (ref 5–15)
BUN: 19 mg/dL (ref 8–23)
CO2: 21 mmol/L — ABNORMAL LOW (ref 22–32)
Calcium: 8 mg/dL — ABNORMAL LOW (ref 8.9–10.3)
Chloride: 97 mmol/L — ABNORMAL LOW (ref 98–111)
Creatinine, Ser: 1.02 mg/dL (ref 0.61–1.24)
GFR, Estimated: >60 mL/min (ref >60)
Glucose, Bld: 99 mg/dL (ref 70–99)
Potassium: 2.8 mmol/L — ABNORMAL LOW (ref 3.5–5.1)
Sodium: 131 mmol/L — ABNORMAL LOW (ref 135–145)

## 2022-02-18 LAB — CBC
HCT: 36 % — ABNORMAL LOW (ref 39.0–52.0)
Hemoglobin: 12.6 g/dL — ABNORMAL LOW (ref 13.0–17.0)
MCH: 32.8 pg (ref 26.0–34.0)
MCHC: 35 g/dL (ref 30.0–36.0)
MCV: 93.8 fL (ref 80.0–100.0)
Platelets: 148 10*3/uL — ABNORMAL LOW (ref 150–400)
RBC: 3.84 MIL/uL — ABNORMAL LOW (ref 4.22–5.81)
RDW: 12.9 % (ref 11.5–15.5)
WBC: 9 10*3/uL (ref 4.0–10.5)
nRBC: 0 % (ref 0.0–0.2)

## 2022-02-18 LAB — PHOSPHORUS: Phosphorus: 3.6 mg/dL (ref 2.5–4.6)

## 2022-02-18 LAB — MAGNESIUM: Magnesium: 1.6 mg/dL — ABNORMAL LOW (ref 1.7–2.4)

## 2022-02-18 LAB — GLUCOSE, CAPILLARY
Glucose-Capillary: 104 mg/dL — ABNORMAL HIGH (ref 70–99)
Glucose-Capillary: 104 mg/dL — ABNORMAL HIGH (ref 70–99)
Glucose-Capillary: 141 mg/dL — ABNORMAL HIGH (ref 70–99)
Glucose-Capillary: 146 mg/dL — ABNORMAL HIGH (ref 70–99)

## 2022-02-18 MED ORDER — POTASSIUM CHLORIDE 20 MEQ PO PACK: 40.0000 meq | PACK | Freq: Once | ORAL | Status: DC

## 2022-02-18 MED ORDER — GUAIFENESIN 100 MG/5ML PO LIQD
5.0000 mL | ORAL | Status: DC | PRN
  Administered 2022-02-18 – 2022-02-25 (×13): 5 mL via ORAL
  Filled 2022-02-18 (×18): qty 5

## 2022-02-18 MED ORDER — LORAZEPAM 2 MG/ML IJ SOLN
0.0000 mg | Freq: Two times a day (BID) | INTRAMUSCULAR | Status: DC
  Administered 2022-02-18: 2 mg via INTRAVENOUS
  Administered 2022-02-20: 1 mg via INTRAVENOUS
  Filled 2022-02-18: qty 1

## 2022-02-18 MED ORDER — LABETALOL HCL 5 MG/ML IV SOLN
10.0000 mg | Freq: Once | INTRAVENOUS | Status: AC
  Administered 2022-02-18: 10 mg via INTRAVENOUS
  Filled 2022-02-18: qty 4

## 2022-02-18 MED ORDER — PANTOPRAZOLE SODIUM 40 MG IV SOLR
40.0000 mg | Freq: Two times a day (BID) | INTRAVENOUS | Status: DC
  Administered 2022-02-18 – 2022-02-19 (×3): 40 mg via INTRAVENOUS
  Filled 2022-02-18 (×3): qty 10

## 2022-02-18 MED ORDER — MAGNESIUM SULFATE 2 GM/50ML IV SOLN
2.0000 g | Freq: Once | INTRAVENOUS | Status: AC
  Administered 2022-02-18: 2 g via INTRAVENOUS
  Filled 2022-02-18: qty 50

## 2022-02-18 MED ORDER — LORAZEPAM 2 MG/ML IJ SOLN
1.0000 mg | INTRAMUSCULAR | Status: DC | PRN
  Administered 2022-02-18: 1 mg via INTRAVENOUS
  Administered 2022-02-18: 3 mg via INTRAVENOUS
  Administered 2022-02-18: 2 mg via INTRAVENOUS
  Administered 2022-02-20: 4 mg via INTRAVENOUS
  Administered 2022-02-20: 2 mg via INTRAVENOUS
  Administered 2022-02-20 (×2): 4 mg via INTRAVENOUS
  Filled 2022-02-18: qty 2
  Filled 2022-02-18: qty 1
  Filled 2022-02-18: qty 2
  Filled 2022-02-18: qty 1
  Filled 2022-02-18 (×3): qty 2

## 2022-02-18 MED ORDER — LORAZEPAM 1 MG PO TABS
1.0000 mg | ORAL_TABLET | ORAL | Status: DC | PRN
  Administered 2022-02-19: 1 mg via ORAL
  Administered 2022-02-20: 4 mg via ORAL
  Filled 2022-02-18: qty 4
  Filled 2022-02-18: qty 1

## 2022-02-18 MED ORDER — ENOXAPARIN SODIUM 40 MG/0.4ML IJ SOSY
40.0000 mg | PREFILLED_SYRINGE | Freq: Every day | INTRAMUSCULAR | Status: DC
Start: 2022-02-18 — End: 2022-02-28
  Administered 2022-02-18 – 2022-02-27 (×10): 40 mg via SUBCUTANEOUS
  Filled 2022-02-18 (×10): qty 0.4

## 2022-02-18 MED ORDER — KCL IN DEXTROSE-NACL 20-5-0.45 MEQ/L-%-% IV SOLN
INTRAVENOUS | Status: DC
Start: 2022-02-18 — End: 2022-02-19
  Filled 2022-02-18 (×4): qty 1000

## 2022-02-18 MED ORDER — LORAZEPAM 1 MG PO TABS
0.0000 mg | ORAL_TABLET | Freq: Two times a day (BID) | ORAL | Status: DC
  Administered 2022-02-19: 1 mg via ORAL
  Filled 2022-02-18: qty 1

## 2022-02-18 MED ORDER — POTASSIUM CHLORIDE CRYS ER 20 MEQ PO TBCR
40.0000 meq | EXTENDED_RELEASE_TABLET | Freq: Once | ORAL | Status: AC
  Administered 2022-02-18: 40 meq via ORAL
  Filled 2022-02-18: qty 2

## 2022-02-18 NOTE — Progress Notes (Signed)
Cross Cover Dose of labetalol ordered for elevated heart rate and blood pressure

## 2022-02-18 NOTE — Progress Notes (Signed)
Progress Note   Patient: Brett Blake [id number]:[id number] DOB: Jul 24, 1941 DOA: 02/14/2022     4  DOS: the patient was seen and examined on 02/18/2022   Brief hospital course: This 81 y.o. male with medical history significant of COPD, alcohol abuse, GERD, hypertension, sleep apnea admitted for alcohol withdrawal.  2/21: Actively withdrawing on CIWA protocol. 2/22: Patient remains sedated on Precedex drip. 2/23: Patient still remains on Precedex drip, alert and following commands. 2/24: Successfully off Precedex.  Patient alert, oriented x1 ,  still confused.  Assessment and Plan: * Alcohol abuse with withdrawal ([place])- (present on admission) Patient remained on CIWA protocol.  Appreciate psych input.  Alcohol level 270.  He continued to remain agitated and restless. Transferred to ICU and started on Precedex gtt. Now off Precedex.  CIWA score 0  AMS (altered mental status)- (present on admission) Due to to alcohol intoxication and withdrawal.   Patient transferred to ICU,  started Precedex gtt. for high CIWA score. Patient is alert, oriented x 1, confused but following commands.  Abnormal LFTs- (present on admission) Attribute mild transaminitis secondary to alcohol abuse.   Cough- (present on admission) CTA evaluation of chest negative for any infiltrates consolidation or effusion. Symptomatic management  GERD (gastroesophageal reflux disease)- (present on admission) Continue IV PPI therapy.  COPD (chronic obstructive pulmonary disease) ([place])- (present on admission) Continue albuterol, Flonase, Singulair, Dulera.  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   He is at high risk for seizure due to alcohol withdrawal. EEG negative, MRI normal.  Sleep apnea- (present on admission) Continue CPAP as per home settings.  Chest pain- (present on admission) Patient had LHC in 2022, done by Dr.  Nehemiah Massed. 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 No further cardiac intervention at this time. This seems noncardiac chest pain, troponin negative.  Essential hypertension- (present on admission) Continue Cardizem and Imdur. Hydralazine as needed for SBP above 170.  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  Chronic alcohol abuse-resolved as of 02/15/2022, (present on admission) Alcohol level of 270, alcohol withdrawal protocol. IV PPI therapy. Magnesium replacement.    Subjective: Patient was seen and examined at bedside.  Overnight events noted. He is off Precedex.  He is alert, oriented x 1, following commands,  still seems confused. Patient remains in soft restraints.   Physical Exam: Vitals:   02/18/22 0500 02/18/22 0600 02/18/22 0726 02/18/22 1241  BP: 117/75 133/85 116/85 (!) 144/82  Pulse: 90 88 87   Resp: 19 (!) 21 20   Temp:   97.8 F (36.6 C) 99 F (37.2 C)  TempSrc:   Oral Oral  SpO2: 100% 99% 100%   Weight:      Height:       General exam: Appears comfortable, confused, deconditioned, not in any distress. Respiratory system: CTA bilaterally, no wheezing, no crackles.  Normal respiratory effort. Cardiovascular system: S1-S2 heard, regular rate and rhythm, no murmur. Gastrointestinal system: Abdomen is soft, non tender, non distended, BS+ Central nervous system: Alert, oriented x1, following commands. Extremities: No edema, no cyanosis, no clubbing. Psychiatry: Not assessed   Data Reviewed: I have Reviewed nursing notes, Vitals, and Lab results since pt's last encounter. Pertinent lab results CBC CMP I have ordered test including BC CMP I have reviewed the last note from psychiatry,  I have discussed pt's care plan and test results with patient's  wife.   Family Communication: Wife at  bedside  Disposition: Status is: Inpatient Remains inpatient appropriate because: Admitted for alcohol withdrawal requiring Precedex gtt. for alcohol withdrawal. Now off Precedex drip.   Planned Discharge Destination: Home  Time spent: 35 minutes  Author: Shawna Clamp, MD 02/18/2022 2:04 PM  For on call review [email or web address].

## 2022-02-18 NOTE — Consult Note (Signed)
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 male patient admitted with "intoxication".  HPI: Patient seen and chart reviewed.  81 year old man brought to the hospital with elevated blood alcohol level by his wife with worsening shortness of breath and cough.  Patient had reportedly made some comments about wishing that he would die.  Found the patient in the ICU arousable but clearly feeling sick.  Tremulous throughout much of the conversation but oriented to his situation.  Tells me that he is here for his drinking.  He says he has been drinking whiskey every night for years and it has gotten worse.  He started because he was using whiskey with lemon and honey as a treatment for his chronic cough.  Patient says his mood has been sad and down.  At times had passive suicidal thoughts.  Denies any intention to do himself any harm or actually wish to die at this point.  Denies being aware of any current visual hallucinations.  Past Psychiatric History: He mentions having been in rehab at least once in the past.  No identified previous specific psychiatric history no previous suicide attempts.  Risk to Self:   Risk to Others:    Prior Inpatient Therapy:   Prior Outpatient Therapy:    Past Medical History:  Past Medical History:  Diagnosis Date   Anxiety    Arthritis    Asthma    Colon polyps    COPD (chronic obstructive pulmonary disease) ([place])    Depression    Diabetes ([place])    Difficult intubation    GERD (gastroesophageal reflux disease)    High cholesterol    Hyperlipidemia    Hypertension    Sleep apnea     Past Surgical History:  Procedure Laterality Date   BRONCHOSCOPY     CARDIAC CATHETERIZATION Left 11/04/2016   Procedure: Left Heart Cath and Coronary Angiography;  Surgeon: Teodoro Spray, MD;  Location: [facility];  Service: Cardiovascular;  Laterality: Left;   CARDIAC CATHETERIZATION     CHOLECYSTECTOMY N/A 05/09/2019   Procedure: LAPAROSCOPIC CHOLECYSTECTOMY WITH INTRAOPERATIVE CHOLANGIOGRAM;  Surgeon: Jules Husbands, MD;  Location: [facility];  Service: General;  Laterality: N/A;   COLOSTOMY     LEFT HEART CATH AND CORONARY ANGIOGRAPHY N/A 12/14/2021   Procedure: LEFT HEART CATH AND CORONARY ANGIOGRAPHY;  Surgeon: Corey Skains, MD;  Location: [facility];  Service: Cardiovascular;  Laterality: N/A;   ROTATOR CUFF REPAIR Left    THUMB ARTHROSCOPY  2015   TONSILLECTOMY  1952   TONSILLECTOMY     Family History:  Family History  Problem Relation Age of Onset   Heart attack Mother    Prostate cancer Father    Leukemia Brother  Prostate cancer Brother    Family Psychiatric  History: None reported Social History:  Social History   Substance and Sexual Activity  Alcohol Use Yes   Alcohol/week: 1.0 - 2.0 standard drink   Types: 1 - 2 Shots of liquor per week     Social History   Substance and Sexual Activity  Drug Use No    Social History   Socioeconomic History   Marital status: Married    Spouse name: Not on file   Number of children: Not on file   Years of education: Not on file   Highest education level: Not on file  Occupational History    Not on file  Tobacco Use   Smoking status: Former    Packs/day: 1.00    Years: 15.00    Pack years: 15.00    Types: Cigarettes   Smokeless tobacco: Never   Tobacco comments:    quit 1977  Vaping Use   Vaping Use: Not on file  Substance and Sexual Activity   Alcohol use: Yes    Alcohol/week: 1.0 - 2.0 standard drink    Types: 1 - 2 Shots of liquor per week   Drug use: No   Sexual activity: Not Currently  Other Topics Concern   Not on file  Social History Narrative   Not on file   Social Determinants of Health   Financial Resource Strain: Not on file  Food Insecurity: Not on file  Transportation Needs: Not on file  Physical Activity: Not on file  Stress: Not on file  Social Connections: Not on file   Additional Social History:    Allergies:   Allergies  Allergen Reactions   Ace Inhibitors Cough   Metoprolol Diarrhea   Sulfa Antibiotics Rash    Labs:  Results for orders placed or performed during the hospital encounter of 02/14/22 (from the past 48 hour(s))  Glucose, capillary     Status: Abnormal   Collection Time: 02/16/22 11:46 PM  Result Value Ref Range   Glucose-Capillary 176 (H) 70 - 99 mg/dL    Comment: Glucose reference range applies only to samples taken after fasting for at least 8 hours.  Glucose, capillary     Status: Abnormal   Collection Time: 02/17/22  7:47 AM  Result Value Ref Range   Glucose-Capillary 157 (H) 70 - 99 mg/dL    Comment: Glucose reference range applies only to samples taken after fasting for at least 8 hours.  Glucose, capillary     Status: Abnormal   Collection Time: 02/17/22 11:35 AM  Result Value Ref Range   Glucose-Capillary 104 (H) 70 - 99 mg/dL    Comment: Glucose reference range applies only to samples taken after fasting for at least 8 hours.  Glucose, capillary     Status: Abnormal   Collection Time: 02/17/22  4:22 PM  Result Value Ref Range   Glucose-Capillary 128 (H) 70 - 99 mg/dL    Comment: Glucose reference  range applies only to samples taken after fasting for at least 8 hours.  Glucose, capillary     Status: None   Collection Time: 02/17/22  8:56 PM  Result Value Ref Range   Glucose-Capillary 98 70 - 99 mg/dL    Comment: Glucose reference range applies only to samples taken after fasting for at least 8 hours.  CBC     Status: Abnormal   Collection Time: 02/18/22  3:14 AM  Result Value Ref Range   WBC 9.0 4.0 - 10.5  K/uL   RBC 3.84 (L) 4.22 - 5.81 MIL/uL   Hemoglobin 12.6 (L) 13.0 - 17.0 g/dL   HCT 36.0 (L) 39.0 - 52.0 %   MCV 93.8 80.0 - 100.0 fL   MCH 32.8 26.0 - 34.0 pg   MCHC 35.0 30.0 - 36.0 g/dL   RDW 12.9 11.5 - 15.5 %   Platelets 148 (L) 150 - 400 K/uL   nRBC 0.0 0.0 - 0.2 %    Comment: Performed at [facility], [address] [place], [place] [address]  Basic metabolic panel     Status: Abnormal   Collection Time: 02/18/22  3:14 AM  Result Value Ref Range   Sodium 131 (L) 135 - 145 mmol/L   Potassium 2.8 (L) 3.5 - 5.1 mmol/L   Chloride 97 (L) 98 - 111 mmol/L   CO2 21 (L) 22 - 32 mmol/L   Glucose, Bld 99 70 - 99 mg/dL    Comment: Glucose reference range applies only to samples taken after fasting for at least 8 hours.   BUN 19 8 - 23 mg/dL   Creatinine, Ser 1.02 0.61 - 1.24 mg/dL   Calcium 8.0 (L) 8.9 - 10.3 mg/dL   GFR, Estimated >60 >60 mL/min    Comment: (NOTE) Calculated using the CKD-EPI Creatinine Equation (2021)    Anion gap 13 5 - 15    Comment: Performed at [facility], [address], [place], [place] [address]  Magnesium     Status: Abnormal   Collection Time: 02/18/22  3:14 AM  Result Value Ref Range   Magnesium 1.6 (L) 1.7 - 2.4 mg/dL    Comment: Performed at [facility], [address], [place], [place] [address]  Phosphorus     Status: None   Collection Time: 02/18/22  3:14 AM  Result Value Ref Range   Phosphorus 3.6 2.5 - 4.6 mg/dL    Comment: Performed at [facility], [address] [place], [place]  [address]  Glucose, capillary     Status: Abnormal   Collection Time: 02/18/22  7:29 AM  Result Value Ref Range   Glucose-Capillary 104 (H) 70 - 99 mg/dL    Comment: Glucose reference range applies only to samples taken after fasting for at least 8 hours.  Glucose, capillary     Status: Abnormal   Collection Time: 02/18/22 11:33 AM  Result Value Ref Range   Glucose-Capillary 104 (H) 70 - 99 mg/dL    Comment: Glucose reference range applies only to samples taken after fasting for at least 8 hours.  Glucose, capillary     Status: Abnormal   Collection Time: 02/18/22  5:04 PM  Result Value Ref Range   Glucose-Capillary 141 (H) 70 - 99 mg/dL    Comment: Glucose reference range applies only to samples taken after fasting for at least 8 hours.   Comment 1 Notify RN     Current Facility-Administered Medications  Medication Dose Route Frequency Provider Last Rate Last Admin   acetaminophen (TYLENOL) tablet 650 mg  650 mg Oral Q6H PRN Para Skeans, MD   650 mg at 02/18/22 1141   Or   acetaminophen (TYLENOL) suppository 650 mg  650 mg Rectal Q6H PRN Para Skeans, MD   650 mg at 02/17/22 2337   albuterol (PROVENTIL) (2.5 MG/3ML) 0.083% nebulizer solution 1.25 mg  1.25 mg Nebulization Q6H PRN Para Skeans, MD       Chlorhexidine Gluconate Cloth 2 % PADS 6 each  6 each Topical Daily Max Sane, MD   6 each at 02/18/22 0904   dexmedetomidine (PRECEDEX) 400 MCG/100ML (4 mcg/mL) infusion  0.4-1.2 mcg/kg/hr Intravenous Titrated Foust, Katy L, NP   Stopped at 02/18/22 0722   dextrose 5 % and 0.45 % NaCl with KCl 20 mEq/L infusion   Intravenous Continuous Shawna Clamp, MD 75 mL/hr at 02/18/22 2000 Infusion Verify at 02/18/22 2000   diltiazem (CARDIZEM CD) 24 hr capsule 240 mg  240 mg Oral q morning Para Skeans, MD   240 mg at 02/15/22 0917   donepezil (ARICEPT) tablet 5 mg  5 mg Oral QHS Para Skeans, MD   5 mg at 02/15/22 2112   enoxaparin (LOVENOX) injection 40 mg  40 mg Subcutaneous QHS Chappell,  Alex B, RPH       fluticasone (FLONASE) 50 MCG/ACT nasal spray 1 spray  1 spray Each Nare Daily PRN Para Skeans, MD       guaiFENesin (ROBITUSSIN) 100 MG/5ML liquid 5 mL  5 mL Oral Q4H PRN Shawna Clamp, MD       hydrALAZINE (APRESOLINE) injection 10 mg  10 mg Intravenous Q6H PRN Val Riles, MD   10 mg at 02/16/22 0950   insulin aspart (novoLOG) injection 0-5 Units  0-5 Units Subcutaneous QHS Max Sane, MD       insulin aspart (novoLOG) injection 0-9 Units  0-9 Units Subcutaneous TID WC Max Sane, MD   1 Units at 02/18/22 1720   isosorbide mononitrate (IMDUR) 24 hr tablet 30 mg  30 mg Oral Daily Florina Ou V, MD   30 mg at 02/15/22 0917   LORazepam (ATIVAN) injection 0-4 mg  0-4 mg Intravenous Q12H Florina Ou V, MD   1 mg at 02/18/22 1300   Or   LORazepam (ATIVAN) tablet 0-4 mg  0-4 mg Oral Q12H Para Skeans, MD       LORazepam (ATIVAN) injection 0-4 mg  0-4 mg Intravenous Q12H Shawna Clamp, MD   2 mg at 02/18/22 1502   Or   LORazepam (ATIVAN) tablet 0-4 mg  0-4 mg Oral Q12H Shawna Clamp, MD       LORazepam (ATIVAN) tablet 1-4 mg  1-4 mg Oral Q1H PRN Shawna Clamp, MD       Or   LORazepam (ATIVAN) injection 1-4 mg  1-4 mg Intravenous Q1H PRN Shawna Clamp, MD   1 mg at 02/18/22 1806   mometasone-formoterol (DULERA) 100-5 MCG/ACT inhaler 2 puff  2 puff Inhalation BID Para Skeans, MD   2 puff at 02/15/22 2112   montelukast (SINGULAIR) tablet 10 mg  10 mg Oral QHS Florina Ou V, MD   10 mg at 02/15/22 2111   nitroGLYCERIN (NITROSTAT) SL tablet 0.4 mg  0.4 mg Sublingual Q5 min PRN Para Skeans, MD       pantoprazole (PROTONIX) injection 40 mg  40 mg Intravenous Q12H Lockie Mola B, RPH   40 mg at 02/18/22 3086   PARoxetine (PAXIL) tablet 20 mg  20 mg Oral Daily Para Skeans, MD   20 mg at 02/15/22 5784   thiamine tablet 100 mg  100 mg Oral Daily Para Skeans, MD   100 mg at 02/15/22 6962   Or   thiamine (B-1) injection 100 mg  100 mg Intravenous Daily Florina Ou V, MD    100 mg at 02/18/22 0903   venlafaxine XR (EFFEXOR-XR) 24 hr capsule 37.5 mg  37.5 mg Oral Daily Para Skeans,  MD   37.5 mg at 02/15/22 8921    Musculoskeletal: Strength & Muscle Tone: decreased Gait & Station: unable to stand Patient leans: N/A            Psychiatric Specialty Exam:  Presentation  General Appearance: No data recorded Eye Contact:No data recorded Speech:No data recorded Speech Volume:No data recorded Handedness:No data recorded  Mood and Affect  Mood:No data recorded Affect:No data recorded  Thought Process  Thought Processes:No data recorded Descriptions of Associations:No data recorded Orientation:No data recorded Thought Content:No data recorded History of Schizophrenia/Schizoaffective disorder:No  Duration of Psychotic Symptoms:No data recorded Hallucinations:No data recorded Ideas of Reference:No data recorded Suicidal Thoughts:No data recorded Homicidal Thoughts:No data recorded  Sensorium  Memory:No data recorded Judgment:No data recorded Insight:No data recorded  Executive Functions  Concentration:No data recorded Attention Span:No data recorded Recall:No data recorded Fund of Knowledge:No data recorded Language:No data recorded  Psychomotor Activity  Psychomotor Activity:No data recorded  Assets  Assets:No data recorded  Sleep  Sleep:No data recorded  Physical Exam: Physical Exam Vitals and nursing note reviewed.  Constitutional:      Appearance: Normal appearance. He is ill-appearing.  HENT:     Head: Normocephalic and atraumatic.     Mouth/Throat:     Pharynx: Oropharynx is clear.  Eyes:     Pupils: Pupils are equal, round, and reactive to light.  Cardiovascular:     Rate and Rhythm: Normal rate and regular rhythm.  Pulmonary:     Effort: Pulmonary effort is normal.     Breath sounds: Normal breath sounds.  Abdominal:     General: Abdomen is flat.     Palpations: Abdomen is soft.  Musculoskeletal:         General: Normal range of motion.  Skin:    General: Skin is warm and dry.  Neurological:     General: No focal deficit present.     Mental Status: He is alert. Mental status is at baseline.  Psychiatric:        Attention and Perception: He is inattentive.        Mood and Affect: Mood normal. Affect is blunt.        Speech: Speech is delayed.        Behavior: Behavior is slowed.        Thought Content: Thought content normal.   Review of Systems  Constitutional: Negative.   HENT: Negative.    Eyes: Negative.   Respiratory:  Positive for cough.   Cardiovascular: Negative.   Gastrointestinal: Negative.   Musculoskeletal: Negative.   Skin: Negative.   Neurological: Negative.   Psychiatric/Behavioral:  Positive for depression and substance abuse. Negative for hallucinations and suicidal ideas. The patient is nervous/anxious and has insomnia.   Blood pressure (!) 174/83, pulse (!) 124, temperature 98.9 F (37.2 C), temperature source Axillary, resp. rate (!) 24, height 5\' 4"  (1.626 m), weight 76.3 kg, SpO2 100 %. Body mass index is 28.87 kg/m.  Treatment Plan Summary: Plan 81 year old man currently going through alcohol withdrawal.  Not necessarily full delirium tremens but certainly shaky still and not fully cognitively alert.  Patient has had some passive suicidal thoughts without any intention or wish to harm himself and denies any intention to harm himself here and has been cooperative with treatment throughout.  I do not think he requires a suicide sitter at this point.  Does not appear to be at elevated risk of self-harm in the hospital.  Supportive counseling and therapy and encouragement.  We can follow up with him during his hospitalization once he is more completely through detox he could be reevaluated for treatment of depression or outpatient substance abuse treatment  Disposition: No evidence of imminent risk to self or others at present.   Patient does not meet criteria for  psychiatric inpatient admission. Supportive therapy provided about ongoing stressors.  Alethia Berthold, MD 02/18/2022 8:37 PM

## 2022-02-18 NOTE — Progress Notes (Signed)
Pt started having a non productive cough, pretty frequently, Messaged md for prn robitussin, waiting on response will continue to monitor.

## 2022-02-18 NOTE — Progress Notes (Signed)
Pt started having tremors in hands, arms and head, ciwa score was a 6, HR was going up into the low 200's, unable to really tell if it was real due to a lot of artifact. Gave prn ativan and notified MD as well as ICU NP. Completed EKG, put in chart. No new orders at this time, will continue to monitor

## 2022-02-19 ENCOUNTER — Encounter: Payer: Self-pay | Admitting: Internal Medicine

## 2022-02-19 DIAGNOSIS — R4182 Altered mental status, unspecified: Secondary | ICD-10-CM | POA: Diagnosis not present

## 2022-02-19 DIAGNOSIS — F1010 Alcohol abuse, uncomplicated: Secondary | ICD-10-CM | POA: Diagnosis not present

## 2022-02-19 DIAGNOSIS — E669 Obesity, unspecified: Secondary | ICD-10-CM

## 2022-02-19 DIAGNOSIS — F10931 Alcohol use, unspecified with withdrawal delirium: Secondary | ICD-10-CM | POA: Diagnosis not present

## 2022-02-19 DIAGNOSIS — R7989 Other specified abnormal findings of blood chemistry: Secondary | ICD-10-CM | POA: Diagnosis not present

## 2022-02-19 DIAGNOSIS — J449 Chronic obstructive pulmonary disease, unspecified: Secondary | ICD-10-CM

## 2022-02-19 LAB — MAGNESIUM: Magnesium: 2 mg/dL (ref 1.7–2.4)

## 2022-02-19 LAB — BASIC METABOLIC PANEL WITH GFR
Anion gap: 10 (ref 5–15)
BUN: 13 mg/dL (ref 8–23)
CO2: 23 mmol/L (ref 22–32)
Calcium: 7.8 mg/dL — ABNORMAL LOW (ref 8.9–10.3)
Chloride: 99 mmol/L (ref 98–111)
Creatinine, Ser: 0.78 mg/dL (ref 0.61–1.24)
GFR, Estimated: >60 mL/min
Glucose, Bld: 196 mg/dL — ABNORMAL HIGH (ref 70–99)
Potassium: 3.1 mmol/L — ABNORMAL LOW (ref 3.5–5.1)
Sodium: 132 mmol/L — ABNORMAL LOW (ref 135–145)

## 2022-02-19 LAB — CBC
HCT: 31.4 % — ABNORMAL LOW (ref 39.0–52.0)
Hemoglobin: 11 g/dL — ABNORMAL LOW (ref 13.0–17.0)
MCH: 33.1 pg (ref 26.0–34.0)
MCHC: 35 g/dL (ref 30.0–36.0)
MCV: 94.6 fL (ref 80.0–100.0)
Platelets: 148 K/uL — ABNORMAL LOW (ref 150–400)
RBC: 3.32 MIL/uL — ABNORMAL LOW (ref 4.22–5.81)
RDW: 12.9 % (ref 11.5–15.5)
WBC: 9.9 K/uL (ref 4.0–10.5)
nRBC: 0 % (ref 0.0–0.2)

## 2022-02-19 LAB — CULTURE, BLOOD (ROUTINE X 2)
Culture: NO GROWTH
Culture: NO GROWTH

## 2022-02-19 LAB — GLUCOSE, CAPILLARY
Glucose-Capillary: 211 mg/dL — ABNORMAL HIGH (ref 70–99)
Glucose-Capillary: 167 mg/dL — ABNORMAL HIGH (ref 70–99)
Glucose-Capillary: 186 mg/dL — ABNORMAL HIGH (ref 70–99)

## 2022-02-19 LAB — PHOSPHORUS: Phosphorus: 2.3 mg/dL — ABNORMAL LOW (ref 2.5–4.6)

## 2022-02-19 MED ORDER — ONDANSETRON HCL 4 MG/2ML IJ SOLN
4.0000 mg | Freq: Four times a day (QID) | INTRAMUSCULAR | Status: DC | PRN
  Administered 2022-02-19 – 2022-02-20 (×2): 4 mg via INTRAVENOUS
  Filled 2022-02-19 (×2): qty 2

## 2022-02-19 MED ORDER — PANTOPRAZOLE SODIUM 40 MG PO TBEC
40.0000 mg | DELAYED_RELEASE_TABLET | Freq: Two times a day (BID) | ORAL | Status: DC
  Administered 2022-02-19 – 2022-02-28 (×17): 40 mg via ORAL
  Filled 2022-02-19 (×17): qty 1

## 2022-02-19 MED ORDER — POTASSIUM CHLORIDE CRYS ER 20 MEQ PO TBCR
40.0000 meq | EXTENDED_RELEASE_TABLET | Freq: Two times a day (BID) | ORAL | Status: AC
  Administered 2022-02-19 (×2): 40 meq via ORAL
  Filled 2022-02-19 (×2): qty 2

## 2022-02-19 MED ORDER — BENZONATATE 100 MG PO CAPS
100.0000 mg | ORAL_CAPSULE | Freq: Two times a day (BID) | ORAL | Status: DC | PRN
  Administered 2022-02-19 – 2022-02-23 (×4): 100 mg via ORAL
  Filled 2022-02-19 (×5): qty 1

## 2022-02-19 NOTE — Plan of Care (Signed)
Problem: Education: Goal: Knowledge of disease or condition will improve Outcome: Progressing Goal: Knowledge of the prescribed therapeutic regimen will improve Outcome: Progressing Goal: Individualized Educational Video(s) Outcome: Progressing   Problem: Activity: Goal: Ability to tolerate increased activity will improve Outcome: Progressing Goal: Will verbalize the importance of balancing activity with adequate rest periods Outcome: Progressing   Problem: Respiratory: Goal: Ability to maintain a clear airway will improve Outcome: Progressing Goal: Levels of oxygenation will improve Outcome: Progressing Goal: Ability to maintain adequate ventilation will improve Outcome: Progressing   Problem: Safety: Goal: Violent Restraint(s) Outcome: Completed/Met Note: Safety sitter instead of restraints   Problem: Health Behavior/Discharge Planning: Goal: Ability to manage health-related needs will improve Outcome: Progressing   Problem: Clinical Measurements: Goal: Ability to maintain clinical measurements within normal limits will improve Outcome: Progressing Goal: Will remain free from infection Outcome: Progressing Goal: Diagnostic test results will improve Outcome: Progressing Goal: Respiratory complications will improve Outcome: Progressing Goal: Cardiovascular complication will be avoided Outcome: Progressing   Problem: Nutrition: Goal: Adequate nutrition will be maintained Outcome: Progressing   Problem: Safety: Goal: Ability to remain free from injury will improve Outcome: Progressing   Problem: Skin Integrity: Goal: Risk for impaired skin integrity will decrease Outcome: Progressing

## 2022-02-19 NOTE — Assessment & Plan Note (Addendum)
Improved after starting nasal spray and cough suppression.  We also increased GERD treatment to twice daily dosing.

## 2022-02-19 NOTE — Assessment & Plan Note (Addendum)
Resolved.  Due to alcohol.

## 2022-02-19 NOTE — Progress Notes (Signed)
°  Progress Note   Patient: Brett Blake TMH:[id number] DOB: 23-Feb-1941 DOA: 02/14/2022     5 DOS: the patient was seen and examined on 02/19/2022       Brief hospital course: This 81 y.o. male with medical history significant of COPD, alcohol abuse, GERD, hypertension, sleep apnea admitted for alcohol withdrawal.  2/21: Actively withdrawing on CIWA protocol. 2/22: Patient remains sedated on Precedex drip. 2/23: Patient still remains on Precedex drip, alert and following commands. 2/24: Successfully off Precedex.  Patient alert, oriented x1 ,  still confused. 2/25: Off precedex, oriented x3      Assessment and Plan: * Delirium tremens ([place])- (present on admission) -Continue PRN Ativan  AMS (altered mental status)- (present on admission) Due to to alcohol intoxication and withdrawal.   Patient transferred to ICU,  started Precedex gtt. for high CIWA score. Now oriented x3  Abnormal LFTs- (present on admission) Attribute mild transaminitis secondary to alcohol abuse.   Cough- (present on admission) Chronic. CTA evaluation of chest negative for any infiltrates consolidation or effusion. Symptomatic management  GERD (gastroesophageal reflux disease)- (present on admission) Continue PPI   COPD (chronic obstructive pulmonary disease) ([place])- (present on admission) Continue albuterol, Flonase, Singulair, Dulera.  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[place])- (present on admission) Continued donepezil  Pseudoseizures ([place]) Seizure precautions.   He is at high risk for seizure due to alcohol withdrawal. EEG negative, MRI normal.  Sleep apnea- (present on admission) Continue CPAP as per home settings.  Chest pain- (present on admission) Patient had LHC in 2022, done by Dr. Nehemiah Massed. 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 No further cardiac intervention at this time. This seems  noncardiac chest pain, troponin negative.  Essential hypertension- (present on admission) Continue Cardizem and Imdur. Hydralazine as needed for SBP above 170.  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  Chronic alcohol abuse-resolved as of 02/15/2022, (present on admission) Alcohol level of 270, alcohol withdrawal protocol. IV PPI therapy. Magnesium replacement.        Subjective: Patient has done well today, no suicidal ideation, mild tremor, no overt confusion although he is still weak and somewhat sluggish.  Chronic cough, unchanged  Physical Exam: Vitals:   02/19/22 1000 02/19/22 1124 02/19/22 1335 02/19/22 1350  BP: (!) 151/86 128/68    Pulse: (!) 111 (!) 107 (!) 116 (!) 110  Resp: (!) 27 (!) 23  (!) 23  Temp:  98 F (36.7 C)    TempSrc:  Oral    SpO2: 97% 100%  99%  Weight:      Height:       Overweight elderly male, lying in bed, no acute distress Tachycardic, no tremor, face symmetric, oriented to person, place, time, and situation. No murmur, no peripheral edema, respiratory rate normal, no rales or wheezes Moves upper extremities with normal coordination, generalized weakness, speech fluent  Data Reviewed: My review of labs and imaging is notable for potassium of 3.1, magnesium normal, sodium 132 Glucose normal Hemoglobin  Family Communication:   Disposition: Status is: Inpatient Remains inpatient appropriate because: He has ongoing weakness, may need SNF rehab          Planned Discharge Destination:  TBD      Author: Edwin Dada, MD 02/19/2022 2:57 PM  For on call review [email or web address].

## 2022-02-19 NOTE — Progress Notes (Signed)
During patient's bath tech notified this RN patient's head of penis is white.  Dr. Loleta Books at bedside will notify.

## 2022-02-20 DIAGNOSIS — F10931 Alcohol use, unspecified with withdrawal delirium: Secondary | ICD-10-CM | POA: Diagnosis not present

## 2022-02-20 DIAGNOSIS — R4182 Altered mental status, unspecified: Secondary | ICD-10-CM | POA: Diagnosis not present

## 2022-02-20 DIAGNOSIS — R7989 Other specified abnormal findings of blood chemistry: Secondary | ICD-10-CM | POA: Diagnosis not present

## 2022-02-20 DIAGNOSIS — F1010 Alcohol abuse, uncomplicated: Secondary | ICD-10-CM | POA: Diagnosis not present

## 2022-02-20 LAB — GLUCOSE, CAPILLARY
Glucose-Capillary: 162 mg/dL — ABNORMAL HIGH (ref 70–99)
Glucose-Capillary: 114 mg/dL — ABNORMAL HIGH (ref 70–99)
Glucose-Capillary: 159 mg/dL — ABNORMAL HIGH (ref 70–99)
Glucose-Capillary: 139 mg/dL — ABNORMAL HIGH (ref 70–99)
Glucose-Capillary: 127 mg/dL — ABNORMAL HIGH (ref 70–99)

## 2022-02-20 LAB — BASIC METABOLIC PANEL
Anion gap: 11 (ref 5–15)
BUN: 10 mg/dL (ref 8–23)
CO2: 22 mmol/L (ref 22–32)
Calcium: 8.3 mg/dL — ABNORMAL LOW (ref 8.9–10.3)
Chloride: 98 mmol/L (ref 98–111)
Creatinine, Ser: 0.7 mg/dL (ref 0.61–1.24)
GFR, Estimated: >60 mL/min (ref >60)
Glucose, Bld: 126 mg/dL — ABNORMAL HIGH (ref 70–99)
Potassium: 3.5 mmol/L (ref 3.5–5.1)
Sodium: 131 mmol/L — ABNORMAL LOW (ref 135–145)

## 2022-02-20 MED ORDER — PHENOBARBITAL SODIUM 65 MG/ML IJ SOLN: 32.5000 mg | Freq: Three times a day (TID) | INTRAMUSCULAR | Status: DC

## 2022-02-20 MED ORDER — PHENOBARBITAL SODIUM 130 MG/ML IJ SOLN
130.0000 mg | INTRAMUSCULAR | Status: AC
  Administered 2022-02-21 (×2): 130 mg via INTRAVENOUS
  Filled 2022-02-20 (×2): qty 1

## 2022-02-20 MED ORDER — SODIUM CHLORIDE 0.9 % IV SOLN
260.0000 mg | Freq: Once | INTRAVENOUS | Status: AC
  Administered 2022-02-20: 260 mg via INTRAVENOUS
  Filled 2022-02-20: qty 2

## 2022-02-20 MED ORDER — PHENOBARBITAL SODIUM 130 MG/ML IJ SOLN: 65.0000 mg | Freq: Three times a day (TID) | INTRAMUSCULAR | Status: DC

## 2022-02-20 MED ORDER — PHENOBARBITAL SODIUM 130 MG/ML IJ SOLN
130.0000 mg | INTRAMUSCULAR | Status: AC
  Administered 2022-02-20 (×2): 130 mg via INTRAVENOUS
  Filled 2022-02-20 (×2): qty 1

## 2022-02-20 MED ORDER — PHENOBARBITAL SODIUM 130 MG/ML IJ SOLN: 97.5000 mg | Freq: Three times a day (TID) | INTRAMUSCULAR | Status: DC

## 2022-02-20 MED ORDER — LORAZEPAM 2 MG/ML IJ SOLN
1.0000 mg | INTRAMUSCULAR | Status: DC | PRN
Start: 2022-02-20 — End: 2022-02-28
  Administered 2022-02-20 (×2): 1 mg via INTRAVENOUS
  Filled 2022-02-20 (×3): qty 1

## 2022-02-20 MED ORDER — GABAPENTIN 300 MG PO CAPS
300.0000 mg | ORAL_CAPSULE | Freq: Three times a day (TID) | ORAL | Status: DC
  Administered 2022-02-20: 300 mg via ORAL
  Filled 2022-02-20: qty 1

## 2022-02-20 NOTE — Progress Notes (Signed)
° °      CROSS COVER NOTE  NAME: Brett Blake MRN: [id number] DOB : 06/25/1941  Secure chat received from RN reporting patient agitation, kicking staff, punching at staff despite Ativan 1 mg IV. Plan of care from day attending reviewed. Phenobarbital 130 mg ordered and restraints ordered.  Neomia Glass MHA, MSN, FNP-BC Nurse Practitioner [facility] Pager [phone]

## 2022-02-20 NOTE — Care Plan (Signed)
Started phenobarbital at 1400 today.  2hrs after dose, patient still agitated at times, required 2x dose IV Ativan.  Given this, will complete phenobarbital load, equivalent to one with moderate alcohol withdrawal syndrome risk: - Have given ~40% of 10mg /kg loading dose (assuming IBW 59kg) as 260 loading dose -  Will give remaining as 30% q3h x2 doses (~130mg  each) to complete ~600mg .  Overnight, can use Ativan 1 mg IV or can repeat 130 mg q30 min x2 after the above doses IF symptoms persist (to reach a maximal 24h dose 15mg /kg/24hrs.)  Will review taper dosing in AM

## 2022-02-20 NOTE — Evaluation (Signed)
Physical Therapy Evaluation Patient Details Name: Brett Blake MRN: [id number] DOB: 07-Jan-1941 Today's Date: 02/20/2022  History of Present Illness  Pt is 81 y/o M admitted on 02/14/22 for treatment of alcohol withdrawal. PMH: COPD, alcohol abuse, GERD3, HTN, sleep apnea  Clinical Impression  Pt noted to have elevated HR but MD attributing this to detox, which is why pt is in the hospital. Pt received in room attempting to get OOB with sitter & granddaughter Brett Blake) attempting to assist pt to lay down in bed. Pt eager to go for a walk, wishing to go to his car. PT & nurse in room with PT facilitating & providing safe environment for pt to stand and ambulate. Pt eager to ambulate & ambulates 1 lap around ICU with nurse present & monitoring vitals but poor reading on HR. Pt with increased speed of stepping & reduced safety awareness requiring min assist + 2. Monitor able to obtain reading of HR & noted to be 194 bpm & PT instructed pt to sit in recliner. Pt encouraged to rest & much more comfortable & less restless after gait. HR decreases to 120s. MD notified of events of session & increased HR. PT encouraged nursing staff to ambulate with pt to help decreased restlessness and suggested providing pt with "busy vest". Pt's granddaughter reports prior to admission pt was independent without AD but did have impaired cognition but was still driving. She and her family are hopeful he can d/c to detox facility - team notified. Will continue to follow pt acutely to address balance, gait with LRAD, and safety/cognition.       Recommendations for follow up therapy are one component of a multi-disciplinary discharge planning process, led by the attending physician.  Recommendations may be updated based on patient status, additional functional criteria and insurance authorization.  Follow Up Recommendations Skilled nursing-short term rehab (<3 hours/day)    Assistance Recommended at Discharge Frequent or  constant Supervision/Assistance  Patient can return home with the following  A lot of help with walking and/or transfers;A lot of help with bathing/dressing/bathroom;Assistance with cooking/housework;Direct supervision/assist for medications management;Help with stairs or ramp for entrance;Direct supervision/assist for financial management;Assist for transportation    Equipment Recommendations  (TBD in next venue)  Recommendations for Other Services       Functional Status Assessment Patient has had a recent decline in their functional status and demonstrates the ability to make significant improvements in function in a reasonable and predictable amount of time.     Precautions / Restrictions Precautions Precautions: Fall Restrictions Weight Bearing Restrictions: No      Mobility  Bed Mobility               General bed mobility comments: Pt received sitting EOB with granddaughter & sitter attempting to keep pt from getting OOB 2/2 safety concerns.    Transfers Overall transfer level: Needs assistance Equipment used: 1 person hand held assist Transfers: Sit to/from Stand Sit to Stand: Min assist                Ambulation/Gait Ambulation/Gait assistance: Min assist, +2 safety/equipment, +2 physical assistance Gait Distance (Feet): 150 Feet Assistive device: 2 person hand held assist Gait Pattern/deviations: Decreased step length - right, Decreased step length - left, Decreased dorsiflexion - right       General Gait Details: Forward trunk lean  Stairs            Wheelchair Mobility    Modified Rankin (Stroke Patients Only)  Balance Overall balance assessment: Needs assistance Sitting-balance support: Feet supported, Bilateral upper extremity supported Sitting balance-Leahy Scale: Fair     Standing balance support: During functional activity, Bilateral upper extremity supported Standing balance-Leahy Scale: Poor                                Pertinent Vitals/Pain Pain Assessment Pain Assessment: No/denies pain    Home Living Family/patient expects to be discharged to:: Private residence Living Arrangements: Spouse/significant other Available Help at Discharge: Family Type of Home: House         Home Layout: One level Home Equipment: None      Prior Function Prior Level of Function : Independent/Modified Independent;Driving             Mobility Comments: Pt independent without AD, driving.       Hand Dominance        Extremity/Trunk Assessment   Upper Extremity Assessment Upper Extremity Assessment: Generalized weakness    Lower Extremity Assessment Lower Extremity Assessment: Generalized weakness       Communication   Communication: No difficulties  Cognition Arousal/Alertness: Awake/alert Behavior During Therapy: Restless, Impulsive Overall Cognitive Status: Impaired/Different from baseline Area of Impairment: Orientation, Memory, Attention, Following commands, Awareness, Problem solving, Safety/judgement                 Orientation Level: Disoriented to, Place, Situation, Time   Memory: Decreased recall of precautions, Decreased short-term memory Following Commands: Follows one step commands inconsistently Safety/Judgement: Decreased awareness of safety, Decreased awareness of deficits Awareness: Intellectual   General Comments: Per granddaughter pt more confused compared to baseline        General Comments      Exercises     Assessment/Plan    PT Assessment Patient needs continued PT services  PT Problem List Decreased strength;Decreased mobility;Decreased safety awareness;Decreased activity tolerance;Decreased balance;Cardiopulmonary status limiting activity;Decreased knowledge of precautions       PT Treatment Interventions Therapeutic exercise;DME instruction;Gait training;Balance training;Functional mobility training;Patient/family  education;Therapeutic activities;Stair training;Neuromuscular re-education    PT Goals (Current goals can be found in the Care Plan section)  Acute Rehab PT Goals Patient Stated Goal: go to alcohol rehab vs SNF PT Goal Formulation: With family Time For Goal Achievement: 03/06/22 Potential to Achieve Goals: Fair    Frequency Min 2X/week     Co-evaluation               AM-PAC PT "6 Clicks" Mobility  Outcome Measure Help needed turning from your back to your side while in a flat bed without using bedrails?: None Help needed moving from lying on your back to sitting on the side of a flat bed without using bedrails?: A Little Help needed moving to and from a bed to a chair (including a wheelchair)?: A Little Help needed standing up from a chair using your arms (e.g., wheelchair or bedside chair)?: A Little Help needed to walk in hospital room?: A Little Help needed climbing 3-5 steps with a railing? : A Little 6 Click Score: 19    End of Session   Activity Tolerance: Patient tolerated treatment well Patient left: in chair;with call bell/phone within reach;with family/visitor present;with nursing/sitter in room Nurse Communication: Mobility status PT Visit Diagnosis: Unsteadiness on feet (R26.81);Muscle weakness (generalized) (M62.81);Difficulty in walking, not elsewhere classified (R26.2)    Time: 8527-7824 PT Time Calculation (min) (ACUTE ONLY): 20 min   Charges:   PT Evaluation $PT  Eval High Complexity: 1 High PT Treatments $Therapeutic Activity: 8-22 mins        Lavone Nian, PT, DPT 02/20/22, 2:39 PM   Waunita Schooner 02/20/2022, 2:36 PM

## 2022-02-20 NOTE — Progress Notes (Signed)
PATIENT VERY AGITATED AND NON RE DIRECTABLE. ATTEMPTING TO GET OOB UNLESS SEDATED.

## 2022-02-20 NOTE — TOC Progression Note (Addendum)
Transition of Care [facility]) - Progression Note    Patient Details  Name: Brett Blake MRN: [id number] Date of Birth: 02/26/1941  Transition of Care [facility]) CM/SW [place], [place] Phone Number: 02/20/2022, 11:21 AM  Clinical Narrative:     CSW provided additional inpatient and outpatient substance use resources to the patient. TOC will continue to follow.   Expected Discharge Plan: Home/Self Care Barriers to Discharge: Continued Medical Work up  Expected Discharge Plan and Services Expected Discharge Plan: Home/Self Care     Post Acute Care Choice: NA Living arrangements for the past 2 months: Single Family Home                                       Social Determinants of Health (SDOH) Interventions    Readmission Risk Interventions No flowsheet data found.

## 2022-02-20 NOTE — Progress Notes (Signed)
Progress Note   Patient: Brett Blake HEN:[id number] DOB: 05-19-1941 DOA: 02/14/2022     6 DOS: the patient was seen and examined on 02/20/2022       Brief hospital course: This 81 y.o. male with medical history significant of COPD, alcohol abuse, GERD, hypertension, sleep apnea admitted for alcohol withdrawal.  2/21: Actively withdrawing on CIWA protocol. 2/22: Patient remains sedated on Precedex drip. 2/23: Patient still remains on Precedex drip, alert and following commands. 2/24: Successfully off Precedex.  Patient alert, oriented x1 ,  still confused. 2/25: Off precedex, oriented x3 2/26: Agitated and confused again, started phenobarbital      Assessment and Plan: * Delirium tremens ([place])- (present on admission) CIWA >18 today, frequent dosing of Ativan needed, and still quite agitated and delirious at times. - Start phenobarbital per pharmacy protocol - Maintain in [place] (altered mental status)- (present on admission) Due to to alcohol intoxication and withdrawal.  Patient transferred to ICU,  started Precedex gtt.  Transitioned off Precedex drip, but still with intermittent confusion, frequent agitation, tachycardia.  - Start phenobarbital    Abnormal LFTs- (present on admission) Attribute mild transaminitis secondary to alcohol abuse.   Cough- (present on admission) Chronic. CTA evaluation of chest negative for any infiltrates consolidation or effusion. 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   He is at high risk for seizure due to alcohol withdrawal. EEG negative, MRI normal.  Sleep apnea- (present on admission) - Continue  CPAP as per home settings.  Chest pain- (present on admission) Patient had LHC in 2022, done by Dr. Nehemiah Massed. 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 No further cardiac intervention at this time. This seems noncardiac chest pain, troponin negative.  Essential hypertension- (present on admission) BP labile - Continue Cardizem and Imdur. - Hydralazine as needed for SBP above 170.  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  Chronic alcohol abuse-resolved as of 02/15/2022, (present on admission) Alcohol level of 270, alcohol withdrawal protocol. IV PPI therapy. Magnesium replacement.        Subjective: Agitated intermittently, aggressive and pulling at lines.  Complaining of some chest pain intermittently, none on my exam.  Cough unchanged       Physical Exam: Vitals:   02/20/22 1012 02/20/22 1100 02/20/22 1200 02/20/22 1230  BP: 128/85   111/65  Pulse:  (!) 127 (!) 103 (!) 109  Resp:  (!) 31 (!) 23 (!) 22  Temp:      TempSrc:      SpO2:  100% 96% 100%  Weight:      Height:       Overweight elderly male, lying in bed, no acute distress Tachycardic, no tremor, face symmetric, oriented to self and "hospital" not to date or month, sleepy from ativan, agitated at times  No murmur, no peripheral edema, respiratory rate normal, no rales or wheezes Moves upper extremities with normal coordination, generalized weakness, speech fluent  Data Reviewed: Labs notable for sodium 131, no change from previous Potassium normalized Glucose good.  Family Communication: Wife by phone  Disposition: Status is: Inpatient Remains inpatient appropriate because: He has ongoing delirium, requires IV  phenobarbital         Planned Discharge Destination:  TBD     Author: Edwin Dada, MD 02/20/2022 4:21 PM  For on  call review [email or web address].
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
## 2022-02-21 DIAGNOSIS — R4182 Altered mental status, unspecified: Secondary | ICD-10-CM | POA: Diagnosis not present

## 2022-02-21 DIAGNOSIS — E876 Hypokalemia: Secondary | ICD-10-CM

## 2022-02-21 DIAGNOSIS — F1010 Alcohol abuse, uncomplicated: Secondary | ICD-10-CM | POA: Diagnosis not present

## 2022-02-21 DIAGNOSIS — F10931 Alcohol use, unspecified with withdrawal delirium: Secondary | ICD-10-CM | POA: Diagnosis not present

## 2022-02-21 DIAGNOSIS — R7989 Other specified abnormal findings of blood chemistry: Secondary | ICD-10-CM | POA: Diagnosis not present

## 2022-02-21 LAB — CBC
HCT: 29.4 % — ABNORMAL LOW (ref 39.0–52.0)
Hemoglobin: 10.1 g/dL — ABNORMAL LOW (ref 13.0–17.0)
MCH: 33 pg (ref 26.0–34.0)
MCHC: 34.4 g/dL (ref 30.0–36.0)
MCV: 96.1 fL (ref 80.0–100.0)
Platelets: 172 10*3/uL (ref 150–400)
RBC: 3.06 MIL/uL — ABNORMAL LOW (ref 4.22–5.81)
RDW: 13.2 % (ref 11.5–15.5)
WBC: 8 10*3/uL (ref 4.0–10.5)
nRBC: 0 % (ref 0.0–0.2)

## 2022-02-21 LAB — COMPREHENSIVE METABOLIC PANEL
ALT: 14 U/L (ref 0–44)
AST: 13 U/L — ABNORMAL LOW (ref 15–41)
Albumin: 2.6 g/dL — ABNORMAL LOW (ref 3.5–5.0)
Alkaline Phosphatase: 91 U/L (ref 38–126)
Anion gap: 13 (ref 5–15)
BUN: 11 mg/dL (ref 8–23)
CO2: 25 mmol/L (ref 22–32)
Calcium: 8.4 mg/dL — ABNORMAL LOW (ref 8.9–10.3)
Chloride: 97 mmol/L — ABNORMAL LOW (ref 98–111)
Creatinine, Ser: 0.7 mg/dL (ref 0.61–1.24)
GFR, Estimated: >60 mL/min (ref >60)
Glucose, Bld: 128 mg/dL — ABNORMAL HIGH (ref 70–99)
Potassium: 3.4 mmol/L — ABNORMAL LOW (ref 3.5–5.1)
Sodium: 135 mmol/L (ref 135–145)
Total Bilirubin: 0.6 mg/dL (ref 0.3–1.2)
Total Protein: 6 g/dL — ABNORMAL LOW (ref 6.5–8.1)

## 2022-02-21 LAB — GLUCOSE, CAPILLARY
Glucose-Capillary: 131 mg/dL — ABNORMAL HIGH (ref 70–99)
Glucose-Capillary: 157 mg/dL — ABNORMAL HIGH (ref 70–99)
Glucose-Capillary: 130 mg/dL — ABNORMAL HIGH (ref 70–99)
Glucose-Capillary: 138 mg/dL — ABNORMAL HIGH (ref 70–99)

## 2022-02-21 LAB — AMMONIA: Ammonia: <10 umol/L (ref 9–35)

## 2022-02-21 LAB — VITAMIN B12: Vitamin B-12: 1071 pg/mL — ABNORMAL HIGH (ref 180–914)

## 2022-02-21 MED ORDER — PHENOBARBITAL SODIUM 130 MG/ML IJ SOLN: 97.5000 mg | Freq: Three times a day (TID) | INTRAMUSCULAR | Status: DC

## 2022-02-21 MED ORDER — POTASSIUM CHLORIDE CRYS ER 20 MEQ PO TBCR
40.0000 meq | EXTENDED_RELEASE_TABLET | Freq: Once | ORAL | Status: AC
  Administered 2022-02-21: 40 meq via ORAL
  Filled 2022-02-21: qty 2

## 2022-02-21 MED ORDER — PHENOBARBITAL SODIUM 65 MG/ML IJ SOLN: 32.5000 mg | Freq: Three times a day (TID) | INTRAMUSCULAR | Status: DC

## 2022-02-21 MED ORDER — PHENOBARBITAL SODIUM 130 MG/ML IJ SOLN: 65.0000 mg | Freq: Three times a day (TID) | INTRAMUSCULAR | Status: DC

## 2022-02-21 MED ORDER — THIAMINE HCL 100 MG/ML IJ SOLN: 100.0000 mg | Freq: Every day | INTRAMUSCULAR | Status: DC

## 2022-02-21 MED ORDER — THIAMINE HCL 100 MG/ML IJ SOLN
250.0000 mg | INTRAVENOUS | Status: AC
  Administered 2022-02-23 – 2022-02-27 (×5): 250 mg via INTRAVENOUS
  Filled 2022-02-21 (×8): qty 2.5

## 2022-02-21 MED ORDER — THIAMINE HCL 100 MG/ML IJ SOLN
500.0000 mg | Freq: Three times a day (TID) | INTRAVENOUS | Status: AC
  Administered 2022-02-21 – 2022-02-22 (×5): 500 mg via INTRAVENOUS
  Filled 2022-02-21 (×7): qty 5

## 2022-02-21 MED ORDER — LACTATED RINGERS IV SOLN
INTRAVENOUS | Status: DC
Start: 2022-02-21 — End: 2022-02-22

## 2022-02-21 MED ORDER — FOLIC ACID 5 MG/ML IJ SOLN
1.0000 mg | Freq: Every day | INTRAMUSCULAR | Status: DC
  Administered 2022-02-21 – 2022-02-27 (×6): 1 mg via INTRAVENOUS
  Filled 2022-02-21 (×9): qty 0.2

## 2022-02-21 NOTE — Progress Notes (Signed)
Patient is removed from restraints at 0315. He is in bed resting with eyes closed and no distress noted. Respirations even and non-labored. No injuries noted from restraint use. Moves all extremities appropriately. Sitter continues at the bedside. Will continue to monitor.

## 2022-02-21 NOTE — Assessment & Plan Note (Signed)
-   Supplemented and resolved 

## 2022-02-21 NOTE — Progress Notes (Signed)
Progress Note   Patient: Brett Blake UUV:[id number] DOB: 12-18-1941 DOA: 02/14/2022     7 DOS: the patient was seen and examined on 02/21/2022       Brief hospital course: This 81 y.o. male with medical history significant of COPD, alcohol abuse, GERD, hypertension, sleep apnea admitted for alcohol withdrawal.  2/21: Actively withdrawing on CIWA protocol. 2/22: Patient remains sedated on Precedex drip. 2/23: Patient still remains on Precedex drip, alert and following commands. 2/24: Successfully off Precedex.  Patient alert, oriented x1 ,  still confused. 2/25: Off precedex, oriented x3 2/26: Agitated and confused again, started phenobarbital 2/27: Some agitation last night, but more sleepy and without agitation today, RASS -2 to -1      Assessment and Plan: * Delirium tremens ([place])- (present on admission) Initially admitted with intoxication (EtOH 271 at 1:53p), already having some withdrawal symptoms.  Started on Librium and PRN Ativan.  Transferred to ICU on hospital day 2 at night, started on Precedex for agitated delirium.  Weaned off Precedex after 48 hours, seemed to be doing well.  On 2/26, had worsening agitation again, CIWA >18 today, frequent dosing of Ativan needed, and still quite agitated and delirious at times, so phenobarbital was started.  Today, this seems to be helping. - Start phenobarbital taper - Start high dose thiamine empirically for possible Wernicke's - Continue folate       AMS (altered mental status)- (present on admission) Due to delirium tremens, possibly in setting of mild dementia.   ?Contribution of thiamine deficiency. CTH unremarkable. TSH normal Blood cultures, UA and procal normal, infection has been ruled out. - Check ammonia, RPR, B12     Abnormal LFTs- (present on admission) Resolved.  Due to alcohol.   Cough- (present on admission) Chronic. CT chest evaluation of chest negative for any infiltrates consolidation or  effusion.  Not COPD flare, not pneumonia, not PE, not CHF, not fibrosis.  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   He is at high risk for seizure due to alcohol withdrawal. EEG negative, MRI normal.  Sleep apnea- (present on admission) - Continue CPAP as per home settings.  Chest pain- (present on admission) CTA chest normal on admission.  ECG normal, troponins normal.  Suspect this is heart burn.   -Continue PPI  Essential hypertension- (present on admission) BP labile - Continue Cardizem and Imdur. - Hydralazine as needed for SBP above 170.  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  Chronic alcohol abuse-resolved as of 02/15/2022, (present on admission) Alcohol level of 270, alcohol withdrawal protocol. IV PPI therapy. Magnesium replacement.        Subjective: Agitated overnight, however today less agitated, more sleepy.,  Still tachycardic.  No focal complaints.         Physical Exam: Vitals:   02/21/22 0855 02/21/22 1000 02/21/22 1100 02/21/22 1200  BP: (!) 135/102 (!) 145/91  124/71  Pulse: (!) 101 (!) 101 (!) 110 (!) 107  Resp: (!) 27 (!) 22 17 20   Temp:    98.5 F (36.9 C)  TempSrc:    Axillary  SpO2: 98% 100% 100% 98%  Weight:      Height:  Overweight elderly male, sleeping, opens eyes briefly, nods head, no other answers Mild tachycardia, regular, no murmurs Respiratory rate normal, easy and unlabored, lung sounds clear without rales or wheezes Abdomen without tenderness or grimace to palpation No focal weakness appreciated, too sleepy to follow  commands     Data Reviewed: Labs notable for potassium down to 3.4 LFTs normal Creatinine normal Complete blood count shows hemoglobin 10 Glucose well controlled  Family Communication: Wife at the bedside  Disposition: Status is: Inpatient Remains inpatient appropriate because: He has ongoing delirium, requires IV phenobarbital  Hopefully over the next 2 days, we can transition to an oral regimen left nighttime Seroquel as we taper phenobarbital and he will remain calm and without significant agitation and able to transition to SNF for rehabilitation.         Planned Discharge Destination:  TBD      Author: Edwin Dada, MD 02/21/2022 2:27 PM  For on call review [email or web address].

## 2022-02-21 NOTE — Evaluation (Signed)
Occupational Therapy Evaluation Patient Details Name: Brett Blake MRN: [id number] DOB: 11-11-1941 Today's Date: 02/21/2022   History of Present Illness Pt is 81 y/o M admitted on 02/14/22 for treatment of alcohol withdrawal. PMH: COPD, alcohol abuse, GERD3, HTN, sleep apnea   Clinical Impression   Pt seen for OT evaluation this date. Functional mobility portion of session coordinated with PT for patient/therapist safety and to address functional/ADL transfers. Upon arrival to room, pt lethargic, however alertness improving follow bed mobility (required MAX A for supine>sit transfer). Pt oriented only to self and no family present to confirm PLOF. Home set-up and PLOF obtained from chart review; pt was independent in all ADL and functional mobility, living in a 1-story home with wife prior to admission. Pt currently requires MAX A for LB dressing (+2 person present for trunk support while completing while seated EOB), MIN A +2 for functional mobility of household distances with 1-person HHA, and MIN A to feed self when seated in recliner due to impaired cognition, decreased balance, and decreased strength. Pt would benefit from additional skilled OT services to maximize return to PLOF and minimize risk of future falls, injury, caregiver burden, and readmission. Upon discharge, recommend SNF.         Recommendations for follow up therapy are one component of a multi-disciplinary discharge planning process, led by the attending physician.  Recommendations may be updated based on patient status, additional functional criteria and insurance authorization.   Follow Up Recommendations  Skilled nursing-short term rehab (<3 hours/day)    Assistance Recommended at Discharge Frequent or constant Supervision/Assistance  Patient can return home with the following A lot of help with walking and/or transfers;Two people to help with bathing/dressing/bathroom    Functional Status Assessment  Patient has  had a recent decline in their functional status and demonstrates the ability to make significant improvements in function in a reasonable and predictable amount of time.  Equipment Recommendations  Other (comment) (defer to next venue of care)       Precautions / Restrictions Precautions Precautions: Fall Restrictions Weight Bearing Restrictions: No      Mobility Bed Mobility Overal bed mobility: Needs Assistance Bed Mobility: Supine to Sit     Supine to sit: Max assist     General bed mobility comments: multimodal cuing to initiate movement    Transfers Overall transfer level: Needs assistance Equipment used: 2 person hand held assist Transfers: Sit to/from Stand Sit to Stand: Min assist, +2 physical assistance                  Balance Overall balance assessment: Needs assistance Sitting-balance support: No upper extremity supported, Feet supported Sitting balance-Leahy Scale: Poor Sitting balance - Comments: Poor dyanmic sitting balance (e.g., when attempting to don socks), requiring MIN-MOD A. Requires only MIN GUARD for static sitting balance   Standing balance support: During functional activity, Bilateral upper extremity supported Standing balance-Leahy Scale: Poor Standing balance comment: Requires MIN A +2 for standing balance                           ADL either performed or assessed with clinical judgement   ADL Overall ADL's : Needs assistance/impaired Eating/Feeding: Minimal assistance;Sitting Eating/Feeding Details (indicate cue type and reason): Requires MIN A to load utensils with food. Able to bring to mouth without assist and only x1 instance of food spillage Grooming: Wash/dry face;Set up;Sitting Grooming Details (indicate cue type and reason): to wash face  with washcloth                     Toileting- Clothing Manipulation and Hygiene: Maximal assistance;+2 for physical assistance;Sitting/lateral lean Toileting - Clothing  Manipulation Details (indicate cue type and reason): Requires MAX A to don socks. +2 person present for trunk support in setting of poor dynamic sitting balance at EOB     Functional mobility during ADLs: Minimal assistance;+2 for physical assistance       Vision Ability to See in Adequate Light: 0 Adequate              Pertinent Vitals/Pain Pain Assessment Pain Assessment: No/denies pain        Extremity/Trunk Assessment Upper Extremity Assessment Upper Extremity Assessment: Generalized weakness   Lower Extremity Assessment Lower Extremity Assessment: Generalized weakness       Communication Communication Communication: No difficulties   Cognition Arousal/Alertness: Awake/alert, Lethargic (initially lethargic, suspect 2/2 medications, but increases alertness as session goes on) Behavior During Therapy: Impulsive Overall Cognitive Status: Impaired/Different from baseline                                 General Comments: Pt oriented to self only. Follows 1-step instructions with multimodal cues                Home Living Family/patient expects to be discharged to:: Private residence Living Arrangements: Spouse/significant other Available Help at Discharge: Family Type of Home: House       Home Layout: One level               Home Equipment: None          Prior Functioning/Environment Prior Level of Function : Independent/Modified Independent;Driving             Mobility Comments: Pt independent without AD, driving.          OT Problem List: Decreased strength;Decreased activity tolerance;Impaired balance (sitting and/or standing);Decreased cognition      OT Treatment/Interventions: Self-care/ADL training;Therapeutic exercise;Energy conservation;DME and/or AE instruction;Therapeutic activities;Patient/family education;Balance training    OT Goals(Current goals can be found in the care plan section) Acute Rehab OT Goals OT  Goal Formulation: Patient unable to participate in goal setting Time For Goal Achievement: 03/07/22 Potential to Achieve Goals: Good ADL Goals Pt Will Perform Grooming: with min assist;standing Pt Will Transfer to Toilet: with min guard assist;ambulating;bedside commode Pt Will Perform Toileting - Clothing Manipulation and hygiene: with mod assist;sit to/from stand  OT Frequency: Min 2X/week    Co-evaluation PT/OT/SLP Co-Evaluation/Treatment: Yes Reason for Co-Treatment: Necessary to address cognition/behavior during functional activity;For patient/therapist safety;To address functional/ADL transfers PT goals addressed during session: Mobility/safety with mobility;Balance OT goals addressed during session: ADL's and self-care      AM-PAC OT "6 Clicks" Daily Activity     Outcome Measure Help from another person eating meals?: A Little Help from another person taking care of personal grooming?: A Little Help from another person toileting, which includes using toliet, bedpan, or urinal?: A Lot Help from another person bathing (including washing, rinsing, drying)?: A Lot Help from another person to put on and taking off regular upper body clothing?: A Little Help from another person to put on and taking off regular lower body clothing?: A Lot 6 Click Score: 15   End of Session Nurse Communication: Mobility status  Activity Tolerance: Patient tolerated treatment well Patient left: in chair;with call bell/phone within  reach;with nursing/sitter in room  OT Visit Diagnosis: Unsteadiness on feet (R26.81);Other symptoms and signs involving cognitive function                Time: 7185-5015 OT Time Calculation (min): 21 min Charges:  OT General Charges $OT Visit: 1 Visit OT Evaluation $OT Eval Moderate Complexity: Kelford [place], OTR/L [place]

## 2022-02-21 NOTE — NC FL2 (Signed)
[place] LEVEL OF CARE SCREENING TOOL     IDENTIFICATION  Patient Name: Brett Blake Birthdate: 08-10-1941 Sex: male Admission Date (Current Location): 02/14/2022  [place] and [place] Number:  Engineering geologist and Address:  [facility], [address], [place], [place] [address]      Provider Number: [id number]  Attending Physician Name and Address:  Edwin Dada, *  Relative Name and Phone Number:  Chelsey Kimberley -wife- [phone]    Current Level of Care: Hospital Recommended Level of Care: [place] Prior Approval Number:    Date Approved/Denied:   PASRR Number: pending  Discharge Plan: SNF    Current Diagnoses: Patient Active Problem List   Diagnosis Date Noted   Delirium tremens ([place]) 02/15/2022   COPD (chronic obstructive pulmonary disease) ([place]) 02/14/2022   GERD (gastroesophageal reflux disease) 02/14/2022   Cough 02/14/2022   Abnormal LFTs 02/14/2022   AMS (altered mental status) 02/14/2022   Unstable angina ([place]) 12/11/2021   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[place]) 11/17/2021   Sepsis ([place]) 05/20/2021   Acute gastroenteritis 05/20/2021   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   Chest pain 07/16/2020   Depression    Cholecystitis 05/08/2019   Paroxysmal tachycardia ([place]) 02/23/2017   Colon polyps 06/29/2016   Essential hypertension 06/29/2016   Elevated PSA 06/29/2016   Hyperlipidemia, mixed 04/19/2016   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 ([place]) 10/19/2015   Insomnia, persistent 09/03/2015   Chronic insomnia 09/03/2015   Sleep apnea 02/13/2015   Chronic bronchitis ([place]) 12/14/2011    Orientation RESPIRATION BLADDER Height & Weight     Self  Normal External catheter Weight: 76.3 kg Height:  5\' 4"  (162.6 cm)  BEHAVIORAL SYMPTOMS/MOOD NEUROLOGICAL BOWEL NUTRITION STATUS      Continent Diet (Carb modified- 1600-2000  cal/day)  AMBULATORY STATUS COMMUNICATION OF NEEDS Skin    Limited Assist Verbally Skin abrasions, Bruising                       Personal Care Assistance Level of Assistance  Bathing, Feeding, Dressing Bathing Assistance: Limited assistance Feeding assistance: Limited assistance Dressing Assistance: Limited assistance     Functional Limitations Info  Sight, Hearing, Speech Sight Info: Adequate Hearing Info: Adequate Speech Info: Adequate    SPECIAL CARE FACTORS FREQUENCY  PT (By licensed PT), OT (By licensed OT)     PT Frequency: 5 days per week OT Frequency: 5 days per week            Contractures Contractures Info: Not present    Additional Factors Info  Code Status, Allergies Code Status Info: Full Allergies Info: ace inhibitors, metoprolol, sulfa antibiotics           Current Medications (02/21/2022):  This is the current hospital active medication list Current Facility-Administered Medications  Medication Dose Route Frequency Provider Last Rate Last Admin   acetaminophen (TYLENOL) tablet 650 mg  650 mg Oral Q6H PRN Para Skeans, MD   650 mg at 02/20/22 0236   Or   acetaminophen (TYLENOL) suppository 650 mg  650 mg Rectal Q6H PRN Para Skeans, MD   650 mg at 02/17/22 2337   albuterol (PROVENTIL) (2.5 MG/3ML) 0.083% nebulizer solution 1.25 mg  1.25 mg Nebulization Q6H PRN Para Skeans, MD       benzonatate (TESSALON) capsule 100 mg  100 mg Oral BID PRN Edwin Dada, MD   100 mg at 02/19/22 1211   Chlorhexidine Gluconate  Cloth 2 % PADS 6 each  6 each Topical Daily Max Sane, MD   6 each at 02/21/22 1048   diltiazem (CARDIZEM CD) 24 hr capsule 240 mg  240 mg Oral q morning Florina Ou V, MD   240 mg at 02/21/22 1048   enoxaparin (LOVENOX) injection 40 mg  40 mg Subcutaneous QHS Lockie Mola B, RPH   40 mg at 02/20/22 2147   fluticasone (FLONASE) 50 MCG/ACT nasal spray 1 spray  1 spray Each Nare Daily PRN Para Skeans, MD       guaiFENesin (ROBITUSSIN) 100 MG/5ML liquid 5 mL  5 mL Oral Q4H PRN Shawna Clamp, MD   5 mL at 02/20/22 0236   hydrALAZINE (APRESOLINE) injection 10 mg  10 mg Intravenous Q6H PRN Val Riles, MD   10 mg at 02/20/22 0236   insulin aspart (novoLOG) injection 0-5 Units  0-5 Units Subcutaneous QHS Max Sane, MD       insulin aspart (novoLOG) injection 0-9 Units  0-9 Units Subcutaneous TID WC Max Sane, MD   1 Units at 02/21/22 0852   isosorbide mononitrate (IMDUR) 24 hr tablet 30 mg  30 mg Oral Daily Florina Ou V, MD   30 mg at 02/21/22 1048   LORazepam (ATIVAN) injection 1 mg  1 mg Intravenous Q4H PRN Edwin Dada, MD   1 mg at 02/20/22 2236   mometasone-formoterol (DULERA) 100-5 MCG/ACT inhaler 2 puff  2 puff Inhalation BID Para Skeans, MD   2 puff at 02/21/22 0852   montelukast (SINGULAIR) tablet 10 mg  10 mg Oral QHS Florina Ou V, MD   10 mg at 02/20/22 2147   nitroGLYCERIN (NITROSTAT) SL tablet 0.4 mg  0.4 mg Sublingual Q5 min PRN Para Skeans, MD   0.4 mg at 02/20/22 0750   ondansetron (ZOFRAN) injection 4 mg  4 mg Intravenous Q6H PRN Edwin Dada, MD   4 mg at 02/20/22 0349   pantoprazole (PROTONIX) EC tablet 40 mg  40 mg Oral BID AC Danford, Suann Larry, MD   40 mg at 02/21/22 2585   PARoxetine (PAXIL) tablet 20 mg  20 mg Oral Daily Florina Ou V, MD   20 mg at 02/21/22 1048   PHENObarbital (LUMINAL) injection 97.5 mg  97.5 mg Intravenous Q8H Danford, Suann Larry, MD       Followed by   Derrill Memo ON 02/23/2022] PHENObarbital (LUMINAL) injection 65 mg  65 mg Intravenous Q8H Danford, Suann Larry, MD       Followed by   Derrill Memo ON 02/25/2022] PHENObarbital (LUMINAL) injection 32.5 mg  32.5 mg Intravenous Q8H Danford, Suann Larry, MD       thiamine tablet 100 mg  100 mg Oral Daily Florina Ou V, MD   100 mg at 02/21/22 1048   Or   thiamine (B-1) injection 100 mg  100 mg Intravenous Daily Florina Ou V, MD   100 mg at 02/18/22 2778   venlafaxine XR (EFFEXOR-XR) 24 hr capsule 37.5 mg  37.5 mg Oral Daily Para Skeans, MD   37.5 mg at  02/21/22 1048     Discharge Medications: Please see discharge summary for a list of discharge medications.  Relevant Imaging Results:  Relevant Lab Results:   Additional Information SS# [id number]  Shelbie Hutching, RN

## 2022-02-21 NOTE — TOC Progression Note (Signed)
Transition of Care [facility]) - Progression Note    Patient Details  Name: Weiland Tomich MRN: [id number] Date of Birth: Feb 09, 1941  [facility] CM/SW Contact  Shelbie Hutching, RN Phone Number: 02/21/2022, 4:36 PM  Clinical Narrative:     Patient's wife agrees to SNF work up.  Bed search started- Passr pending.  Expected Discharge Plan: Home/Self Care Barriers to Discharge: Continued Medical Work up  Expected Discharge Plan and Services Expected Discharge Plan: Home/Self Care     Post Acute Care Choice: NA Living arrangements for the past 2 months: Single Family Home                                       Social Determinants of Health (SDOH) Interventions    Readmission Risk Interventions No flowsheet data found.

## 2022-02-21 NOTE — Assessment & Plan Note (Addendum)
-   Supplemented and resolved 

## 2022-02-21 NOTE — Progress Notes (Signed)
Patient is resting in bed with eyes closed. No acute distress noted. He is calm and cooperative and following commands. Leads reapplied and patient put back on the monitor. Sitter at the bedside. Call bell in reach. Will continue to monitor.
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
## 2022-02-21 NOTE — TOC Progression Note (Signed)
Transition of Care [facility]) - Progression Note    Patient Details  Name: Brett Blake MRN: [id number] Date of Birth: [birth year]  Transition of Care [facility]) CM/SW Contact  Shelbie Hutching, RN Phone Number: 02/21/2022, 11:58 AM  Clinical Narrative:    Current recommendation from PT and OT is for SNF for short term physical rehab.  RNCM attempted to call wife, message left on VM for return call.  Pasrr is pending, bed search not started yet will speak with wife first.    Patient is still very confused, has sitter at bedside.  Required restraints last night.    Expected Discharge Plan: Home/Self Care Barriers to Discharge: Continued Medical Work up  Expected Discharge Plan and Services Expected Discharge Plan: Home/Self Care     Post Acute Care Choice: NA Living arrangements for the past 2 months: Single Family Home                                       Social Determinants of Health (SDOH) Interventions    Readmission Risk Interventions No flowsheet data found.

## 2022-02-21 NOTE — Progress Notes (Addendum)
Physical Therapy Treatment Patient Details Name: Brett Blake MRN: [id number] DOB: [birth year] Today's Date: 02/21/2022   History of Present Illness Pt is 81 y/o M admitted on 02/14/22 for treatment of alcohol withdrawal. PMH: COPD, alcohol abuse, GERD3, HTN, sleep apnea    PT Comments    Pt seen for PT tx with co-tx with OT for pt & therapists safety. Pt received asleep, requiring extra time & cuing to increase alertness. Pt requires mod/max assist for bed mobility & min assist +2 for sit>stand. Pt is able to ambulate short distance with HHA min assist +2 plus chair follow for safety. Pt demonstrates significantly impaired gait pattern as noted below, which significantly increases his fall risk. Will continue to follow pt acutely to progress gait with LRAD, endurance, balance & cognition/safety awareness.  Max HR during gait 112 bpm    Recommendations for follow up therapy are one component of a multi-disciplinary discharge planning process, led by the attending physician.  Recommendations may be updated based on patient status, additional functional criteria and insurance authorization.  Follow Up Recommendations  Skilled nursing-short term rehab (<3 hours/day)     Assistance Recommended at Discharge Frequent or constant Supervision/Assistance  Patient can return home with the following A lot of help with walking and/or transfers;A lot of help with bathing/dressing/bathroom;Assistance with cooking/housework;Direct supervision/assist for medications management;Help with stairs or ramp for entrance;Direct supervision/assist for financial management;Assist for transportation   Equipment Recommendations  None recommended by PT    Recommendations for Other Services       Precautions / Restrictions Precautions Precautions: Fall Restrictions Weight Bearing Restrictions: No     Mobility  Bed Mobility Overal bed mobility: Needs Assistance Bed Mobility: Supine to Sit     Supine  to sit: HOB elevated, Mod assist, Max assist     General bed mobility comments: multimodal cuing to initiate movement    Transfers Overall transfer level: Needs assistance Equipment used: 2 person hand held assist Transfers: Sit to/from Stand Sit to Stand: Min assist, +2 physical assistance                Ambulation/Gait Ambulation/Gait assistance: Min assist, +2 safety/equipment, +2 physical assistance Gait Distance (Feet): 35 Feet Assistive device: 2 person hand held assist Gait Pattern/deviations: Decreased step length - right, Decreased step length - left, Decreased dorsiflexion - right, Shuffle, Narrow base of support, Decreased dorsiflexion - left Gait velocity: decreased     General Gait Details: Forward trunk lean, decreased heel strike,   Stairs             Wheelchair Mobility    Modified Rankin (Stroke Patients Only)       Balance Overall balance assessment: Needs assistance Sitting-balance support: Feet supported, Bilateral upper extremity supported Sitting balance-Leahy Scale: Fair     Standing balance support: During functional activity, Bilateral upper extremity supported Standing balance-Leahy Scale: Poor                              Cognition Arousal/Alertness: Awake/alert (initially lethargic, suspect 2/2 medications, but increases alertness as session goes on) Behavior During Therapy: Impulsive Overall Cognitive Status: Impaired/Different from baseline Area of Impairment: Orientation, Memory, Attention, Following commands, Awareness, Problem solving, Safety/judgement                 Orientation Level: Disoriented to, Place, Situation, Time   Memory: Decreased recall of precautions, Decreased short-term memory Following Commands: Follows one step commands inconsistently  Safety/Judgement: Decreased awareness of safety, Decreased awareness of deficits Awareness: Intellectual            Exercises      General  Comments        Pertinent Vitals/Pain Pain Assessment Pain Assessment: No/denies pain    Home Living                          Prior Function            PT Goals (current goals can now be found in the care plan section) Acute Rehab PT Goals Patient Stated Goal: go to alcohol rehab vs SNF PT Goal Formulation: With family Time For Goal Achievement: 03/06/22 Potential to Achieve Goals: Fair Progress towards PT goals: Progressing toward goals    Frequency    Min 2X/week      PT Plan Current plan remains appropriate    Co-evaluation PT/OT/SLP Co-Evaluation/Treatment: Yes Reason for Co-Treatment: Necessary to address cognition/behavior during functional activity;For patient/therapist safety;To address functional/ADL transfers PT goals addressed during session: Mobility/safety with mobility        AM-PAC PT "6 Clicks" Mobility   Outcome Measure  Help needed turning from your back to your side while in a flat bed without using bedrails?: A Little Help needed moving from lying on your back to sitting on the side of a flat bed without using bedrails?: A Lot Help needed moving to and from a bed to a chair (including a wheelchair)?: A Little Help needed standing up from a chair using your arms (e.g., wheelchair or bedside chair)?: A Little Help needed to walk in hospital room?: A Lot Help needed climbing 3-5 steps with a railing? : A Lot 6 Click Score: 15    End of Session   Activity Tolerance: Patient tolerated treatment well Patient left: in chair (in care of OT) Nurse Communication: Mobility status PT Visit Diagnosis: Unsteadiness on feet (R26.81);Muscle weakness (generalized) (M62.81);Difficulty in walking, not elsewhere classified (R26.2)     Time: 4287-6811 PT Time Calculation (min) (ACUTE ONLY): 14 min  Charges:  $Therapeutic Activity: 8-22 mins                     Lavone Nian, PT, DPT 02/21/22, 12:43 PM   Waunita Schooner 02/21/2022,  12:41 PM

## 2022-02-22 DIAGNOSIS — F1010 Alcohol abuse, uncomplicated: Secondary | ICD-10-CM | POA: Diagnosis not present

## 2022-02-22 DIAGNOSIS — R4182 Altered mental status, unspecified: Secondary | ICD-10-CM | POA: Diagnosis not present

## 2022-02-22 DIAGNOSIS — R7989 Other specified abnormal findings of blood chemistry: Secondary | ICD-10-CM | POA: Diagnosis not present

## 2022-02-22 DIAGNOSIS — F10931 Alcohol use, unspecified with withdrawal delirium: Secondary | ICD-10-CM | POA: Diagnosis not present

## 2022-02-22 LAB — RPR: RPR Ser Ql: NONREACTIVE

## 2022-02-22 LAB — BASIC METABOLIC PANEL
Anion gap: 9 (ref 5–15)
BUN: 9 mg/dL (ref 8–23)
CO2: 25 mmol/L (ref 22–32)
Calcium: 8.8 mg/dL — ABNORMAL LOW (ref 8.9–10.3)
Chloride: 97 mmol/L — ABNORMAL LOW (ref 98–111)
Creatinine, Ser: 0.81 mg/dL (ref 0.61–1.24)
GFR, Estimated: >60 mL/min (ref >60)
Glucose, Bld: 119 mg/dL — ABNORMAL HIGH (ref 70–99)
Potassium: 4.1 mmol/L (ref 3.5–5.1)
Sodium: 131 mmol/L — ABNORMAL LOW (ref 135–145)

## 2022-02-22 LAB — CBC
HCT: 28.8 % — ABNORMAL LOW (ref 39.0–52.0)
Hemoglobin: 9.9 g/dL — ABNORMAL LOW (ref 13.0–17.0)
MCH: 32.9 pg (ref 26.0–34.0)
MCHC: 34.4 g/dL (ref 30.0–36.0)
MCV: 95.7 fL (ref 80.0–100.0)
Platelets: 207 10*3/uL (ref 150–400)
RBC: 3.01 MIL/uL — ABNORMAL LOW (ref 4.22–5.81)
RDW: 13.1 % (ref 11.5–15.5)
WBC: 7.1 10*3/uL (ref 4.0–10.5)
nRBC: 0 % (ref 0.0–0.2)

## 2022-02-22 LAB — GLUCOSE, CAPILLARY
Glucose-Capillary: 110 mg/dL — ABNORMAL HIGH (ref 70–99)
Glucose-Capillary: 156 mg/dL — ABNORMAL HIGH (ref 70–99)
Glucose-Capillary: 185 mg/dL — ABNORMAL HIGH (ref 70–99)
Glucose-Capillary: 147 mg/dL — ABNORMAL HIGH (ref 70–99)

## 2022-02-22 MED ORDER — MELATONIN 5 MG PO TABS
2.5000 mg | ORAL_TABLET | Freq: Every day | ORAL | Status: DC
  Administered 2022-02-22 – 2022-02-24 (×3): 2.5 mg via ORAL
  Filled 2022-02-22 (×3): qty 1

## 2022-02-22 MED ORDER — GABAPENTIN 300 MG PO CAPS
300.0000 mg | ORAL_CAPSULE | Freq: Every day | ORAL | Status: DC
Start: 2022-02-22 — End: 2022-02-28
  Administered 2022-02-22 – 2022-02-27 (×6): 300 mg via ORAL
  Filled 2022-02-22 (×6): qty 1

## 2022-02-22 MED ORDER — PROPRANOLOL HCL 20 MG PO TABS
10.0000 mg | ORAL_TABLET | Freq: Two times a day (BID) | ORAL | Status: DC
  Administered 2022-02-22 – 2022-02-28 (×12): 10 mg via ORAL
  Filled 2022-02-22 (×12): qty 1

## 2022-02-22 MED ORDER — MELATONIN 3 MG PO TABS
3.0000 mg | ORAL_TABLET | Freq: Every day | ORAL | Status: DC
  Filled 2022-02-22: qty 1

## 2022-02-22 MED ORDER — SENNOSIDES-DOCUSATE SODIUM 8.6-50 MG PO TABS
1.0000 | ORAL_TABLET | Freq: Two times a day (BID) | ORAL | Status: DC | PRN
  Administered 2022-02-22: 1 via ORAL
  Filled 2022-02-22: qty 1

## 2022-02-22 MED ORDER — POLYETHYLENE GLYCOL 3350 17 G PO PACK
17.0000 g | PACK | Freq: Every day | ORAL | Status: DC
  Administered 2022-02-22 – 2022-02-26 (×4): 17 g via ORAL
  Filled 2022-02-22 (×7): qty 1

## 2022-02-22 MED ORDER — ACETAMINOPHEN 500 MG PO TABS
1000.0000 mg | ORAL_TABLET | Freq: Every day | ORAL | Status: DC
Start: 2022-02-22 — End: 2022-02-28
  Administered 2022-02-22 – 2022-02-27 (×6): 1000 mg via ORAL
  Filled 2022-02-22 (×6): qty 2

## 2022-02-22 NOTE — Progress Notes (Signed)
To whom it may concern:  Please be advised the above named patient will require a short term nursing home stay- 30 days or less for rehabilitation and strengthening. Plan is for return home.

## 2022-02-22 NOTE — Progress Notes (Signed)
Patient is resting in bed with eyes closed. Sitter at the bedside. Patient has been appropriate during this shift. He stated that he had a bed night last night but that he is feeling much better tonight. No acute distress noted. Will continue to monitor.

## 2022-02-22 NOTE — Progress Notes (Addendum)
Progress Note   Patient: Brett Blake ERD:[id number] DOB: [birth year] DOA: 02/14/2022     8 DOS: the patient was seen and examined on 02/22/2022       Brief hospital course: This 81 y.o. male with medical history significant of COPD, alcohol abuse, GERD, hypertension, sleep apnea admitted for alcohol withdrawal.  2/21: Actively withdrawing on CIWA protocol. 2/22: Patient remains sedated on Precedex drip. 2/23: Patient still remains on Precedex drip, alert and following commands. 2/24: Successfully off Precedex.  Patient alert, oriented x1 ,  still confused. 2/25: Off precedex, oriented x3 2/26: Agitated and confused again, started phenobarbital 2/27: Some agitation last night, but more sleepy and without agitation today, RASS -2 to -1      Assessment and Plan: * Delirium tremens ([place])- (present on admission) Per daughter, had been sober about 40 years until 2-3 years ago.  Then started to develop early dementia, and started alcohol to suppress sundowning.  This naturally escalated to heavy daily use, the daughter estimates about 1-2 gallons bourbon per week.    Initially admitted with intoxication (EtOH 271 at 1:53p), already having some withdrawal symptoms.  Started on Librium and PRN Ativan.  Transferred to ICU on hospital day 2 at night, started on Precedex for agitated delirium.  Weaned off Precedex after 48 hours, seemed to be doing well.  On 2/26, had worsening agitation again, CIWA >18 today, frequent dosing of Ativan needed, and still quite agitated and delirious at times, so phenobarbital was started.  Now 24 hours after phenobarbital load.  Has woken up this morning and is much more alert and cooperative. - D/c sitter - Hold any phenobarbital taper for now - Continue high dose thiamine empirically for possible Wernicke's, day 2   For night time: - Start acetaminophen 1000 mg nightly and melatonin - Start gabapentin 300 mg nightly - Discussed seroquel QHS with  daughter, including black box warning, but will hold off on this for now, family will discuss  - Continue folate       Hypokalemia Supplemented and resolved  Hypomagnesemia Supplemented and resolved  AMS (altered mental status)- (present on admission) This was acute metabolic encephalopathy due to delirium tremens, in setting of dementia.   ?Contribution of thiamine deficiency. CTH unremarkable. TSH normal Blood cultures, UA and procal normal, infection has been ruled out.  Ammonia normal, B12 normal.   - Follow RPR     Abnormal LFTs- (present on admission) Resolved.  Due to alcohol.   Cough- (present on admission) Chronic. CT chest evaluation of chest negative for any infiltrates consolidation or effusion.  Not COPD flare, not pneumonia, not PE, not CHF, not fibrosis.  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   He is at high risk for seizure due to alcohol withdrawal. EEG negative, MRI normal.  Sleep apnea- (present on admission) - Continue CPAP as per home settings.  Chest pain- (present on admission) CTA chest normal on admission.  ECG normal, troponins normal.  Suspect this is heart burn.   -Continue PPI  Essential hypertension- (present on admission) BP  labile - Continue Cardizem and Imdur - Resume propranolol - Hydralazine as needed for SBP above 170.  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  Chronic alcohol  abuse-resolved as of 02/15/2022, (present on admission) Alcohol level of 270, alcohol withdrawal protocol. IV PPI therapy. Magnesium replacement.        Subjective: Slept well last night, feeling much better this morning, interactive, somewhat sleepy, but oriented to situation, self, place.  Still has cough, no headache, dyspnea, sputum.  He has some chronic chest pain which is unchanged from his baseline      Physical Exam: Vitals:   02/22/22 1000 02/22/22 1100 02/22/22 1150 02/22/22 1200  BP:   134/90 107/66  Pulse: (!) 101 (!) 113  (!) 34  Resp: (!) 23 17 14 17   Temp:   98.9 F (37.2 C)   TempSrc:   Oral   SpO2: 99% 92%  100%  Weight:      Height:         Elderly adult male, lying in bed, no acute distress Heart rate regular, no murmurs, no lower extremity edema Normal respiratory rate and rhythm, lungs clear without rales or wheezes Moves upper extremities with generalized weakness but normal coordination, face symmetric, speech fluent, mild psychomotor slowing Attention normal, affect blunted, judgment insight appear normal     Data Reviewed: Labs notable for sodium 131, stable Creatinine normal Hemoglobin 10, no change from previous, ammonia normal, B12 normal Glucose good  Family Communication: daughter by phone  Disposition: Status is: Inpatient Remains inpatient appropriate because:   His delirium is resolved, but he will need 1 additional day of IV thiamine,  If he is able to sleep well tonight with his new regimen for sleep as outlined above, and has no further withdrawal or agitation or sundowning, I suspect he will be medically ready after his last dose of thiamine tomorrow to discharge to skilled nursing facility        Planned Discharge Destination:  TBD      Author: Edwin Dada, MD 02/22/2022 3:50 PM  For on call review [email or web address].

## 2022-02-22 NOTE — TOC Progression Note (Signed)
Transition of Care [facility]) - Progression Note    Patient Details  Name: Brett Blake MRN: [id number] Date of Birth: [birth year]  Transition of [facility]) CM/SW Contact  Shelbie Hutching, RN Phone Number: 02/22/2022, 5:01 PM  Clinical Narrative:    Patient without sitter today, MD thinks he may be medically ready for discharge tomorrow.  No bed offers received at this time, extended bed search.  Pasrr pending.    Expected Discharge Plan: Home/Self Care Barriers to Discharge: Continued Medical Work up  Expected Discharge Plan and Services Expected Discharge Plan: Home/Self Care     Post Acute Care Choice: NA Living arrangements for the past 2 months: Single Family Home                                       Social Determinants of Health (SDOH) Interventions    Readmission Risk Interventions No flowsheet data found.

## 2022-02-22 NOTE — Progress Notes (Addendum)
Physical Therapy Treatment Patient Details Name: Brett Blake MRN: [id number] DOB: 01/22/1941 Today's Date: 02/22/2022   History of Present Illness Pt is 81 y/o M admitted on 02/14/22 for treatment of alcohol withdrawal. PMH: COPD, alcohol abuse, GERD3, HTN, sleep apnea    PT Comments    Pt is resting in bed upon PT entrance into room w/ sitter at bedside. He wakes to voice and tactile cue; is willing to work w/ PT today. Pt is able to perform bed mobility w/SUPERVISION. Once seated EOB, he is able to perform sit to stand w/ CGA using RW. He is able to ambulate ~126ft w/ CGA using RW; he does have a few episodes of LOB but is able to recover CGA, with RW. Verbal cues were provided due to R drift w/ ambulation and drifting outside of BOS of walker. Pt will benefit from continued skilled PT in order to improve LE strength, mobility/gait, and restore PLOF. Current discharge recommendation remains appropriate due to the level of assistance required by the patient to ensure safety and improve overall function.   Recommendations for follow up therapy are one component of a multi-disciplinary discharge planning process, led by the attending physician.  Recommendations may be updated based on patient status, additional functional criteria and insurance authorization.  Follow Up Recommendations  Skilled nursing-short term rehab (<3 hours/day)     Assistance Recommended at Discharge Frequent or constant Supervision/Assistance  Patient can return home with the following A lot of help with walking and/or transfers;A lot of help with bathing/dressing/bathroom;Assistance with cooking/housework;Direct supervision/assist for medications management;Help with stairs or ramp for entrance;Direct supervision/assist for financial management;Assist for transportation   Equipment Recommendations  None recommended by PT    Recommendations for Other Services       Precautions / Restrictions  Precautions Precautions: Fall Restrictions Weight Bearing Restrictions: No     Mobility  Bed Mobility Overal bed mobility: Needs Assistance Bed Mobility: Supine to Sit     Supine to sit: Supervision     General bed mobility comments: cuing to initiate movement    Transfers Overall transfer level: Needs assistance Equipment used: Rolling walker (2 wheels) Transfers: Sit to/from Stand Sit to Stand: Min guard                Ambulation/Gait Ambulation/Gait assistance: Min guard Gait Distance (Feet): 160 Feet Assistive device: Rolling walker (2 wheels) Gait Pattern/deviations: Decreased step length - right, Decreased step length - left, Decreased dorsiflexion - right, Narrow base of support, Decreased dorsiflexion - left, Step-to pattern, Staggering right, Drifts right/left Gait velocity: decreased     General Gait Details: Forward trunk lean, drifts to right/outside BOS of RW.   Stairs             Wheelchair Mobility    Modified Rankin (Stroke Patients Only)       Balance Overall balance assessment: Needs assistance Sitting-balance support: No upper extremity supported, Feet supported Sitting balance-Leahy Scale: Fair     Standing balance support: During functional activity, Bilateral upper extremity supported, Reliant on assistive device for balance Standing balance-Leahy Scale: Fair                              Cognition Arousal/Alertness: Awake/alert, Lethargic Behavior During Therapy: Impulsive (some impulsivity, but easily re-directed) Overall Cognitive Status: Impaired/Different from baseline  General Comments: Pt A&Ox4: self, location, month, year. Follows 1-step instructions with multimodal cues        Exercises      General Comments        Pertinent Vitals/Pain Pain Assessment Pain Assessment: No/denies pain    Home Living                          Prior  Function            PT Goals (current goals can now be found in the care plan section) Progress towards PT goals: Progressing toward goals    Frequency    Min 2X/week      PT Plan Current plan remains appropriate    Co-evaluation              AM-PAC PT "6 Clicks" Mobility   Outcome Measure  Help needed turning from your back to your side while in a flat bed without using bedrails?: A Little Help needed moving from lying on your back to sitting on the side of a flat bed without using bedrails?: A Little Help needed moving to and from a bed to a chair (including a wheelchair)?: A Little Help needed standing up from a chair using your arms (e.g., wheelchair or bedside chair)?: A Little Help needed to walk in hospital room?: A Little Help needed climbing 3-5 steps with a railing? : A Lot 6 Click Score: 17    End of Session Equipment Utilized During Treatment: Gait belt Activity Tolerance: Patient tolerated treatment well Patient left: in chair;with call bell/phone within reach;with nursing/sitter in room Nurse Communication: Mobility status PT Visit Diagnosis: Unsteadiness on feet (R26.81);Muscle weakness (generalized) (M62.81);Difficulty in walking, not elsewhere classified (R26.2)     Time: 7681-1572 PT Time Calculation (min) (ACUTE ONLY): 23 min  Charges:                         Jonnie Kind, SPT 02/22/2022, 3:10 PM

## 2022-02-22 NOTE — Progress Notes (Signed)
To whom it may concern:  Please be advised the above named patient has a primary diagnosis of dementia which supersedes any psychiatric diagnosis.

## 2022-02-23 ENCOUNTER — Encounter: Payer: Self-pay | Admitting: Internal Medicine

## 2022-02-23 ENCOUNTER — Inpatient Hospital Stay: Payer: Medicare Other

## 2022-02-23 DIAGNOSIS — R053 Chronic cough: Secondary | ICD-10-CM | POA: Diagnosis not present

## 2022-02-23 DIAGNOSIS — I10 Essential (primary) hypertension: Secondary | ICD-10-CM | POA: Diagnosis not present

## 2022-02-23 DIAGNOSIS — F10931 Alcohol use, unspecified with withdrawal delirium: Secondary | ICD-10-CM | POA: Diagnosis not present

## 2022-02-23 DIAGNOSIS — E1151 Type 2 diabetes mellitus with diabetic peripheral angiopathy without gangrene: Secondary | ICD-10-CM

## 2022-02-23 DIAGNOSIS — G4730 Sleep apnea, unspecified: Secondary | ICD-10-CM

## 2022-02-23 DIAGNOSIS — R531 Weakness: Secondary | ICD-10-CM

## 2022-02-23 DIAGNOSIS — E871 Hypo-osmolality and hyponatremia: Secondary | ICD-10-CM

## 2022-02-23 DIAGNOSIS — E876 Hypokalemia: Secondary | ICD-10-CM

## 2022-02-23 HISTORY — DX: Hypo-osmolality and hyponatremia: E87.1

## 2022-02-23 LAB — CBC
HCT: 30.4 % — ABNORMAL LOW (ref 39.0–52.0)
Hemoglobin: 10.3 g/dL — ABNORMAL LOW (ref 13.0–17.0)
MCH: 32.2 pg (ref 26.0–34.0)
MCHC: 33.9 g/dL (ref 30.0–36.0)
MCV: 95 fL (ref 80.0–100.0)
Platelets: 254 10*3/uL (ref 150–400)
RBC: 3.2 MIL/uL — ABNORMAL LOW (ref 4.22–5.81)
RDW: 13 % (ref 11.5–15.5)
WBC: 6.6 10*3/uL (ref 4.0–10.5)
nRBC: 0 % (ref 0.0–0.2)

## 2022-02-23 LAB — BASIC METABOLIC PANEL
Anion gap: 7 (ref 5–15)
BUN: 9 mg/dL (ref 8–23)
CO2: 27 mmol/L (ref 22–32)
Calcium: 8.7 mg/dL — ABNORMAL LOW (ref 8.9–10.3)
Chloride: 97 mmol/L — ABNORMAL LOW (ref 98–111)
Creatinine, Ser: 0.74 mg/dL (ref 0.61–1.24)
GFR, Estimated: >60 mL/min (ref >60)
Glucose, Bld: 145 mg/dL — ABNORMAL HIGH (ref 70–99)
Potassium: 3.8 mmol/L (ref 3.5–5.1)
Sodium: 131 mmol/L — ABNORMAL LOW (ref 135–145)

## 2022-02-23 LAB — GLUCOSE, CAPILLARY
Glucose-Capillary: 124 mg/dL — ABNORMAL HIGH (ref 70–99)
Glucose-Capillary: 149 mg/dL — ABNORMAL HIGH (ref 70–99)
Glucose-Capillary: 185 mg/dL — ABNORMAL HIGH (ref 70–99)
Glucose-Capillary: 186 mg/dL — ABNORMAL HIGH (ref 70–99)
Glucose-Capillary: 145 mg/dL — ABNORMAL HIGH (ref 70–99)

## 2022-02-23 MED ORDER — BENZONATATE 100 MG PO CAPS
100.0000 mg | ORAL_CAPSULE | Freq: Once | ORAL | Status: AC
  Administered 2022-02-23: 100 mg via ORAL
  Filled 2022-02-23: qty 1

## 2022-02-23 MED ORDER — QUETIAPINE FUMARATE 25 MG PO TABS
212.5000 mg | ORAL_TABLET | Freq: Every day | ORAL | Status: DC
  Administered 2022-02-24 (×3): 212.5 mg via ORAL
  Filled 2022-02-23: qty 8.5
  Filled 2022-02-23: qty 9
  Filled 2022-02-23: qty 8.5

## 2022-02-23 MED ORDER — BENZONATATE 100 MG PO CAPS
200.0000 mg | ORAL_CAPSULE | Freq: Three times a day (TID) | ORAL | Status: DC
  Administered 2022-02-23 – 2022-02-28 (×15): 200 mg via ORAL
  Filled 2022-02-23 (×16): qty 2

## 2022-02-23 MED ORDER — FLUTICASONE PROPIONATE 50 MCG/ACT NA SUSP
2.0000 | Freq: Every day | NASAL | Status: DC
  Administered 2022-02-23 – 2022-02-28 (×6): 2 via NASAL
  Filled 2022-02-23: qty 16

## 2022-02-23 NOTE — Progress Notes (Signed)
Physical Therapy Treatment Patient Details Name: Brett Blake MRN: [id number] DOB: [birth year] Today's Date: 02/23/2022   History of Present Illness Pt is 81 y/o M admitted on 02/14/22 for treatment of alcohol withdrawal. PMH: COPD, alcohol abuse, GERD3, HTN, sleep apnea    PT Comments    Pt was long sitting in bed with supportive spouse at bedside. He is alert and oriented x 3. Agrees to session and is eager for OOB activity. Pt was easily and safely able to exit bed without physical assistance. He stood to RW and ambulated 200 ft without LOB. Vitals were stable throughout. Discussed with pt's spouse via phone after session, DC disposition. I explained that pt is progressing well enough to DC home. She reports," Its not safe to take him home. He needs alcohol/ behavioral rehab." Acute PT will continue to follow and progress as able. Recommendations updated to follow physicians recs about DC going forward.     Recommendations for follow up therapy are one component of a multi-disciplinary discharge planning process, led by the attending physician.  Recommendations may be updated based on patient status, additional functional criteria and insurance authorization.  Follow Up Recommendations  Follow physician's recommendations for discharge plan and follow up therapies     Assistance Recommended at Discharge Frequent or constant Supervision/Assistance (pt will require 24/7 supervision/assistance due to alcohol abuse concerns)  Patient can return home with the following A little help with walking and/or transfers;A little help with bathing/dressing/bathroom;Direct supervision/assist for medications management;Direct supervision/assist for financial management;Help with stairs or ramp for entrance   Equipment Recommendations  None recommended by PT       Precautions / Restrictions Precautions Precautions: Fall Restrictions Weight Bearing Restrictions: No     Mobility  Bed  Mobility Overal bed mobility: Needs Assistance Bed Mobility: Supine to Sit     Supine to sit: Supervision   Transfers Overall transfer level: Needs assistance Equipment used: Rolling walker (2 wheels) Transfers: Sit to/from Stand Sit to Stand: Supervision     Ambulation/Gait Ambulation/Gait assistance: Min guard Gait Distance (Feet): 200 Feet Assistive device: Rolling walker (2 wheels) Gait Pattern/deviations: Decreased step length - right, Decreased step length - left, Decreased dorsiflexion - right, Narrow base of support, Decreased dorsiflexion - left, Step-to pattern, Staggering right, Drifts right/left Gait velocity: decreased     General Gait Details: Constant vcs for posture correction     Balance Overall balance assessment: Needs assistance Sitting-balance support: No upper extremity supported, Feet supported Sitting balance-Leahy Scale: Fair     Standing balance support: During functional activity, Bilateral upper extremity supported, Reliant on assistive device for balance Standing balance-Leahy Scale: Fair       Cognition Arousal/Alertness: Awake/alert Behavior During Therapy: WFL for tasks assessed/performed Overall Cognitive Status: Within Functional Limits for tasks assessed      Memory: Decreased recall of precautions, Decreased short-term memory Following Commands: Follows one step commands inconsistently Safety/Judgement: Decreased awareness of safety, Decreased awareness of deficits Awareness: Intellectual   General Comments: Pt was A & O X 3. He was able to follow commands consistently throughout.               Pertinent Vitals/Pain Pain Assessment Pain Assessment: No/denies pain     PT Goals (current goals can now be found in the care plan section) Acute Rehab PT Goals Patient Stated Goal: "Get to my new room." Progress towards PT goals: Progressing toward goals    Frequency    Min 2X/week  PT Plan Current plan remains  appropriate    Co-evaluation     PT goals addressed during session: Mobility/safety with mobility;Balance;Strengthening/ROM;Proper use of DME        AM-PAC PT "6 Clicks" Mobility   Outcome Measure  Help needed turning from your back to your side while in a flat bed without using bedrails?: A Little Help needed moving from lying on your back to sitting on the side of a flat bed without using bedrails?: A Little Help needed moving to and from a bed to a chair (including a wheelchair)?: A Little Help needed standing up from a chair using your arms (e.g., wheelchair or bedside chair)?: A Little Help needed to walk in hospital room?: A Little Help needed climbing 3-5 steps with a railing? : A Little 6 Click Score: 18    End of Session Equipment Utilized During Treatment: Gait belt Activity Tolerance: Patient tolerated treatment well Patient left: in chair;with call bell/phone within reach;with nursing/sitter in room Nurse Communication: Mobility status PT Visit Diagnosis: Unsteadiness on feet (R26.81);Muscle weakness (generalized) (M62.81);Difficulty in walking, not elsewhere classified (R26.2)     Time: 2993-7169 PT Time Calculation (min) (ACUTE ONLY): 26 min  Charges:  $Gait Training: 8-22 mins $Therapeutic Activity: 8-22 mins                     Julaine Fusi PTA 02/23/22, 1:59 PM

## 2022-02-23 NOTE — Assessment & Plan Note (Deleted)
Continues to do well with physical therapy the last 2 days. ?

## 2022-02-23 NOTE — Progress Notes (Signed)
?Progress Note ? ? ?Patient: Brett Blake BJY:[id number] DOB: 1941/07/26 DOA: 02/14/2022     9 ?DOS: the patient was seen and examined on 02/23/2022 ?  ?Brief hospital course: ?This 81 y.o. male with medical history significant of COPD, alcohol abuse, GERD, hypertension, sleep apnea admitted for alcohol withdrawal. ? ?2/21: Actively withdrawing on CIWA protocol. ?2/22: Patient remains sedated on Precedex drip. ?2/23: Patient still remains on Precedex drip, alert and following commands. ?2/24: Successfully off Precedex.  Patient alert, oriented x1 ,  still confused. ?2/25: Off precedex, oriented x3 ?2/26: Agitated and confused again, started phenobarbital ?2/27: Some agitation last night, but more sleepy and without agitation today, RASS -2 to -1 ? ?Assessment and Plan: ?* Delirium tremens ([place])- (present on admission) ?Mental status better today.  Still on medium dose IV thiamine ? ?For night time: ?- Start acetaminophen 1000 mg nightly and melatonin ?- Start gabapentin 300 mg nightly ?- Discussed seroquel QHS.  Family wanted to try this. ? ? ? ? ? ?Cough- (present on admission) ?Chronic.  We will repeat chest x-ray today.  Patient on inhalers and nebulizer treatments.  On PPI for GERD.  We will start nose spray and see if that helps.  Cough suppression with Tessalon Perles and cough syrup. ? ? ?Essential hypertension- (present on admission) ?On Cardizem Imdur and propranolol ? ?Weakness ?Patient did better today with his mental status being better.  Walked 200 feet with rolling walker. ? 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  We will get rid of sliding scale. ? ?Hypokalemia ?Supplemented and resolved ? ?Hypomagnesemia ?Supplemented and resolved ? ?AMS (altered mental status)- (present on admission) ?This was acute metabolic encephalopathy due to delirium tremens, in setting of dementia.   ? ? ? ? ?Abnormal LFTs- (present on admission) ?Resolved.  Due to alcohol. ? ? ?GERD  (gastroesophageal reflux disease)- (present on admission) ?- Continue PPI  ? ?COPD (chronic obstructive pulmonary disease) ([place])- (present on admission) ?FEV1 64% ?- Continue albuterol,  Singulair, Dulera ? ?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[place])- (present on admission) ?-Hold donepezil for now ?-Family wanted to go with low-dose Seroquel at night ? ?Pseudoseizures ([place]) ?Seizure precautions.   ?He is at high risk for seizure due to alcohol withdrawal. ?EEG negative, MRI normal. ? ?Sleep apnea- (present on admission) ?Continue nocturnal CPAP ? ?Chest pain- (present on admission) ?No further chest pain.  CT angio of the chest did not show any pulmonary embolism. ? ?Hyponatremia.  Sodium 131.  Likely secondary to alcohol.  Continue to monitor ? ? ? ? ? ? ? ?Subjective: Patient with a continuous cough today.  He stated The mouth last night.  Initially admitted with alcohol withdrawal. ? ?Physical Exam: ?Vitals:  ? 02/23/22 1000 02/23/22 1200 02/23/22 1310 02/23/22 1400  ?BP: (!) 167/97 124/77  (!) 144/87  ?Pulse: (!) 103 (!) 104  (!) 106  ?Resp: 19 (!) 30  17  ?Temp: 98.7 ?F (37.1 ?C) 98.7 ?F (37.1 ?C)  98.9 ?F (37.2 ?C)  ?TempSrc: Oral Oral  Oral  ?SpO2: 95% 96% 100% 95%  ?Weight:      ?Height:      ? ?Physical Exam ?HENT:  ?   Head: Normocephalic.  ?   Mouth/Throat:  ?   Pharynx: No oropharyngeal exudate.  ?Eyes:  ?   General: Lids are normal.  ?   Conjunctiva/sclera: Conjunctivae normal.  ?Cardiovascular:  ?   Rate and Rhythm: Normal rate and regular rhythm.  ?   Heart sounds:  Normal heart sounds, S1 normal and S2 normal.  ?Pulmonary:  ?   Breath sounds: No decreased breath sounds, wheezing, rhonchi or rales.  ?Abdominal:  ?   Palpations: Abdomen is soft.  ?   Tenderness: There is no abdominal tenderness.  ?Musculoskeletal:  ?   Right lower leg: No swelling.  ?   Left lower leg: No swelling.  ?Skin: ?   General: Skin is warm.  ?   Findings: No rash.  ?Neurological:  ?   Mental Status: He is alert.  ?   Comments: Answers  questions appropriately and able to straight leg raise.  ?  ? ?Data Reviewed: ?Laboratory radiological data reviewed from the hospital course. ?Today sodium 131 ? ?Family Communication: Spoke with wife at the bedside ? ?Disposition: ?Status is: Inpatient ?Remains inpatient appropriate because: He is continuously coughing today ? ?Planned Discharge Destination: Initially physical therapy recommended rehab but actually did better today with walking.  Transitional care team working on the case. ? ?Author: ?Loletha Grayer, MD ?02/23/2022 2:55 PM ? ?For on call review [email or web address].  ? ?

## 2022-02-23 NOTE — Progress Notes (Addendum)
Occupational Therapy Treatment ?Patient Details ?Name: Brett Blake ?MRN: [id number] ?DOB: 1941/04/29 ?Today's Date: 02/23/2022 ? ? ?History of present illness Pt is 81 y/o M admitted on 02/14/22 for treatment of alcohol withdrawal. PMH: COPD, alcohol abuse, GERD3, HTN, sleep apnea ?  ?OT comments ? Brett Blake was seen for OT treatment on this date. Upon arrival to room pt seated EOB, agreeable to tx. Pt requires SUPERVISION + RW for toilet t/f and hand washing standing sinkside, completes perihygiene with lateral lean. CGA ~279ft functional mobility no AD with good safety awareness. Pt making good progress toward goals. Pt continues to benefit from skilled OT services to maximize return to PLOF and minimize risk of future falls, injury, caregiver burden, and readmission. Will continue to follow POC. Discharge recommendation remains updated to reflect pt progress.  ? ?Recommendations for follow up therapy are one component of a multi-disciplinary discharge planning process, led by the attending physician.  Recommendations may be updated based on patient status, additional functional criteria and insurance authorization. ?   ?Follow Up Recommendations ? Follow physician's recommendations for discharge plan and follow up therapies  ?  ?Assistance Recommended at Discharge Frequent or constant Supervision/Assistance  ?Patient can return home with the following ? A lot of help with walking and/or transfers;Two people to help with bathing/dressing/bathroom ?  ?Equipment Recommendations ? Other (comment)  ?  ?Recommendations for Other Services   ? ?  ?Precautions / Restrictions Precautions ?Precautions: Fall ?Restrictions ?Weight Bearing Restrictions: No  ? ? ?  ? ?Mobility Bed Mobility ?Overal bed mobility: Needs Assistance ?Bed Mobility: Supine to Sit, Sit to Supine ?  ?  ?Supine to sit: Supervision ?Sit to supine: Supervision ?  ?  ?  ? ?Transfers ?Overall transfer level: Needs assistance ?Equipment used: Rolling walker  (2 wheels) ?Transfers: Sit to/from Stand ?Sit to Stand: Supervision ?  ?  ?  ?  ?  ?  ?  ?  ?Balance Overall balance assessment: Needs assistance ?Sitting-balance support: No upper extremity supported, Feet supported ?Sitting balance-Leahy Scale: Fair ?  ?  ?Standing balance support: No upper extremity supported, During functional activity ?Standing balance-Leahy Scale: Fair ?  ?  ?  ?  ?  ?  ?  ?  ?  ?  ?  ?  ?   ? ?ADL either performed or assessed with clinical judgement  ? ?ADL Overall ADL's : Needs assistance/impaired ?  ?  ?  ?  ?  ?  ?  ?  ?  ?  ?  ?  ?  ?  ?  ?  ?  ?  ?  ?General ADL Comments: SUPERVISION + RW for toilet t/f and hand washing standing sinkside, completes perihygiene with lateral lean. CGA ~259ft functional mobility with good safety awareness. ?  ? ? ? ?Cognition Arousal/Alertness: Awake/alert ?Behavior During Therapy: [facility] for tasks assessed/performed ?Overall Cognitive Status: Within Functional Limits for tasks assessed ?  ?  ?  ?  ?  ?  ?  ?  ?  ?  ?  ?  ?  ?Safety/Judgement: Decreased awareness of safety, Decreased awareness of deficits ?  ?  ?General Comments: A&O x4, decreased safety awareness ?  ?  ?   ? ?Pertinent Vitals/ Pain       Pain Assessment ?Pain Assessment: No/denies pain ? ? ?Frequency ? Min 2X/week  ? ? ? ? ?  ?Progress Toward Goals ? ?OT Goals(current goals can now be found in the care plan section) ?  Progress towards OT goals: Progressing toward goals ? ?Acute Rehab OT Goals ?OT Goal Formulation: With patient ?Time For Goal Achievement: 03/07/22 ?Potential to Achieve Goals: Good ?ADL Goals ?Pt Will Perform Grooming: with min assist;standing ?Pt Will Transfer to Toilet: with min guard assist;ambulating;bedside commode ?Pt Will Perform Toileting - Clothing Manipulation and hygiene: with mod assist;sit to/from stand  ?Plan Frequency remains appropriate;Discharge plan needs to be updated   ? ?Co-evaluation ? ? ?   ?  ?PT goals addressed during session: Mobility/safety with  mobility;Balance;Strengthening/ROM;Proper use of DME ?  ?  ? ?  ?AM-PAC OT "6 Clicks" Daily Activity     ?Outcome Measure ? ? Help from another person eating meals?: None ?Help from another person taking care of personal grooming?: A Little ?Help from another person toileting, which includes using toliet, bedpan, or urinal?: A Little ?Help from another person bathing (including washing, rinsing, drying)?: A Little ?Help from another person to put on and taking off regular upper body clothing?: A Little ?Help from another person to put on and taking off regular lower body clothing?: A Little ?6 Click Score: 19 ? ?  ?End of Session Equipment Utilized During Treatment: Gait belt ? ?OT Visit Diagnosis: Unsteadiness on feet (R26.81);Other symptoms and signs involving cognitive function ?  ?Activity Tolerance Patient tolerated treatment well ?  ?Patient Left in bed;with call bell/phone within reach;with bed alarm set ?  ?Nurse Communication Mobility status ?  ? ?   ? ?Time: 9983-3825 ?OT Time Calculation (min): 27 min ? ?Charges: OT General Charges ?$OT Visit: 1 Visit ?OT Treatments ?$Self Care/Home Management : 8-22 mins ? ?Dessie Coma, M.S. OTR/L  ?02/23/22, 4:22 PM  ?ascom [phone] ? ?

## 2022-02-24 DIAGNOSIS — I10 Essential (primary) hypertension: Secondary | ICD-10-CM | POA: Diagnosis not present

## 2022-02-24 DIAGNOSIS — R531 Weakness: Secondary | ICD-10-CM | POA: Diagnosis not present

## 2022-02-24 DIAGNOSIS — E871 Hypo-osmolality and hyponatremia: Secondary | ICD-10-CM

## 2022-02-24 DIAGNOSIS — F10931 Alcohol use, unspecified with withdrawal delirium: Secondary | ICD-10-CM | POA: Diagnosis not present

## 2022-02-24 DIAGNOSIS — R053 Chronic cough: Secondary | ICD-10-CM | POA: Diagnosis not present

## 2022-02-24 LAB — GLUCOSE, CAPILLARY
Glucose-Capillary: 146 mg/dL — ABNORMAL HIGH (ref 70–99)
Glucose-Capillary: 153 mg/dL — ABNORMAL HIGH (ref 70–99)
Glucose-Capillary: 164 mg/dL — ABNORMAL HIGH (ref 70–99)

## 2022-02-24 NOTE — Care Management Important Message (Signed)
Important Message ? ?Patient Details  ?Name: Brett Blake ?MRN: [id number] ?Date of Birth: [birth year] ? ? ?Medicare Important Message Given:  Yes ? ? ? ? ?Juliann Pulse A [name] ?02/24/2022, 10:52 AM ?

## 2022-02-24 NOTE — Progress Notes (Signed)
?Progress Note ? ? ?Patient: Brett Blake UMP:[id number] DOB: 1941-01-31 DOA: 02/14/2022     10 ?DOS: the patient was seen and examined on 02/24/2022 ?  ?Brief hospital course: ?This 81 y.o. male with medical history significant of COPD, alcohol abuse, GERD, hypertension, sleep apnea admitted for alcohol withdrawal. ? ?2/21: Actively withdrawing on CIWA protocol. ?2/22: Patient remains sedated on Precedex drip. ?2/23: Patient still remains on Precedex drip, alert and following commands. ?2/24: Successfully off Precedex.  Patient alert, oriented x1 ,  still confused. ?2/25: Off precedex, oriented x3 ?2/26: Agitated and confused again, started phenobarbital ?2/27: Some agitation last night, but more sleepy and without agitation today, RASS -2 to -1 ? ?02/23/2022 patient transferred out to the floor. ? ?Assessment and Plan: ?* Delirium tremens ([place]) ?This has resolved.  Stable to go out to facility when bed available. ? ? ? ? ?Cough ?Improved today with nasal spray and cough suppression. ? ? ?Essential hypertension ?On Cardizem Imdur and propranolol ? ?Weakness ?Patient did better today with his mental status being better.  Walked 200 feet with rolling walker. ? ?Hypokalemia ?Supplemented and resolved ? ?Hypomagnesemia ?Supplemented and resolved ? ?AMS (altered mental status) ?This was acute metabolic encephalopathy due to delirium tremens, in setting of dementia.   ? ? ? ? ?Abnormal LFTs ?Resolved.  Due to alcohol. ? ? ?GERD (gastroesophageal reflux disease) ?- Continue PPI  ? ?COPD (chronic obstructive pulmonary disease) ([place]) ?FEV1 64% ?- Continue albuterol,  Singulair, Dulera ? ?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[place]) ?-Hold donepezil for now ?-Family wanted to go with low-dose Seroquel at night ? ?Pseudoseizures ([place]) ?Seizure precautions.   ?He is at high risk for seizure due to alcohol withdrawal. ?EEG negative, MRI normal. ? ?Sleep apnea ?Continue nocturnal CPAP ? ?Chest pain ?No further chest pain.  CT angio of the  chest did not show any pulmonary embolism. ? 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  We will get rid of sliding scale. ? ?Chronic alcohol abuse-resolved as of 02/15/2022 ?Alcohol level of 270, alcohol withdrawal protocol. ?IV PPI therapy. ?Magnesium replacement. ? ? ? ? ? ? ? ?Subjective: Patient feeling better.  Offers no complaints.  Getting stronger with physical therapy.  Patient's wife unable to take care of him at home.  Concerned that he will go right back to alcohol if he does not get any help. ? ?Physical Exam: ?Vitals:  ? 02/24/22 0353 02/24/22 0827 02/24/22 1159 02/24/22 1627  ?BP: 107/70 135/84 (!) 158/94 119/81  ?Pulse: 82 86 (!) 105 (!) 105  ?Resp: 16 17 16 18   ?Temp: 98 ?F (36.7 ?C) 97.6 ?F (36.4 ?C) 97.9 ?F (36.6 ?C) 97.7 ?F (36.5 ?C)  ?TempSrc:      ?SpO2: 96% 97% 95% 97%  ?Weight:      ?Height:      ? ?Physical Exam ?HENT:  ?   Head: Normocephalic.  ?   Mouth/Throat:  ?   Pharynx: No oropharyngeal exudate.  ?Eyes:  ?   General: Lids are normal.  ?   Conjunctiva/sclera: Conjunctivae normal.  ?Cardiovascular:  ?   Rate and Rhythm: Normal rate and regular rhythm.  ?   Heart sounds: Normal heart sounds, S1 normal and S2 normal.  ?Pulmonary:  ?   Breath sounds: No decreased breath sounds, wheezing, rhonchi or rales.  ?Abdominal:  ?   Palpations: Abdomen is soft.  ?   Tenderness: There is no abdominal tenderness.  ?Musculoskeletal:  ?   Right lower leg: No  swelling.  ?   Left lower leg: No swelling.  ?Skin: ?   General: Skin is warm.  ?   Findings: No rash.  ?Neurological:  ?   Mental Status: He is alert.  ?   Comments: Answers questions appropriately and able to straight leg raise.  ?  ? ?Data Reviewed: ?Last 2 sugars 146 and 153. ? ? ?Family Communication: Spoke with wife on the phone ? ?Disposition: ?Status is: Inpatient ?Remains inpatient appropriate because: Transitional care team looking into rehabs for patient. ? ?Planned Discharge Destination:  Rehab ? ?Author: ?Loletha Grayer, MD ?02/24/2022 5:14 PM ? ?For on call review [email or web address].  ? ?

## 2022-02-24 NOTE — TOC Progression Note (Signed)
Transition of Care (TOC) - Progression Note  ? ? ?Patient Details  ?Name: Brett Blake ?MRN: [id number] ?Date of Birth: 1941/04/14 ? ?Transition of Care (TOC) CM/SW Contact  ?Shelbie Hutching, RN ?Phone Number: ?02/24/2022, 11:25 AM ? ?Clinical Narrative:    ?[facility] in [place] has a bed available for short term rehab.  They request that psychiatry see patient before they officially offer, at some time during patient's hospital say he did make statements that he wanted to kill himself. ?Otila Kluver- Admissions at [phone]. ? ? ?Expected Discharge Plan: Home/Self Care ?Barriers to Discharge: Continued Medical Work up ? ?Expected Discharge Plan and Services ?Expected Discharge Plan: Home/Self Care ?  ?  ?Post Acute Care Choice: NA ?Living arrangements for the past 2 months: [place] ?                ?  ?  ?  ?  ?  ?  ?  ?  ?  ?  ? ? ?Social Determinants of Health (SDOH) Interventions ?  ? ?Readmission Risk Interventions ?No flowsheet data found. ? ?

## 2022-02-24 NOTE — Plan of Care (Signed)

## 2022-02-24 NOTE — Plan of Care (Signed)
?  Problem: Education: ?Goal: Knowledge of disease or condition will improve ?Outcome: Progressing ?Goal: Knowledge of the prescribed therapeutic regimen will improve ?Outcome: Progressing ?  ?Problem: Activity: ?Goal: Ability to tolerate increased activity will improve ?Outcome: Progressing ?Goal: Will verbalize the importance of balancing activity with adequate rest periods ?Outcome: Progressing ?  ?Problem: Respiratory: ?Goal: Ability to maintain a clear airway will improve ?Outcome: Progressing ?Goal: Levels of oxygenation will improve ?Outcome: Progressing ?Goal: Ability to maintain adequate ventilation will improve ?Outcome: Progressing ?  ?Problem: Health Behavior/Discharge Planning: ?Goal: Ability to manage health-related needs will improve ?Outcome: Progressing ?  ?Problem: Clinical Measurements: ?Goal: Ability to maintain clinical measurements within normal limits will improve ?Outcome: Progressing ?Goal: Will remain free from infection ?Outcome: Progressing ?Goal: Diagnostic test results will improve ?Outcome: Progressing ?Goal: Respiratory complications will improve ?Outcome: Progressing ?Goal: Cardiovascular complication will be avoided ?Outcome: Progressing ?  ?Problem: Nutrition: ?Goal: Adequate nutrition will be maintained ?Outcome: Progressing ?  ?Problem: Safety: ?Goal: Ability to remain free from injury will improve ?Outcome: Progressing ?  ?Problem: Skin Integrity: ?Goal: Risk for impaired skin integrity will decrease ?Outcome: Progressing ?  ?

## 2022-02-24 NOTE — Progress Notes (Signed)
Physical Therapy Treatment ?Patient Details ?Name: Brett Blake ?MRN: [id number] ?DOB: 1941-01-29 ?Today's Date: 02/24/2022 ? ? ?History of Present Illness Pt is 81 y/o M admitted on 02/14/22 for treatment of alcohol withdrawal. PMH: COPD, alcohol abuse, GERD3, HTN, sleep apnea ? ?  ?PT Comments  ? ? Pt was supine in bed upon arriving. He is A and O x 3. Agrees to session and is cooperative throughout. Author questions if pt is fully aware of overall situation. Was easily able to exit bed, stand, and ambulate with RW. Attempted ambulation without AD with pt demonstrating some unsteadiness. Overall pt is doing well from an acute PT standpoint. Recommend 24/7 supervision at DC. ?  ?Recommendations for follow up therapy are one component of a multi-disciplinary discharge planning process, led by the attending physician.  Recommendations may be updated based on patient status, additional functional criteria and insurance authorization. ? ?Follow Up Recommendations ? Follow physician's recommendations for discharge plan and follow up therapies ?  ?  ?Assistance Recommended at Discharge Frequent or constant Supervision/Assistance  ?Patient can return home with the following A little help with walking and/or transfers;A little help with bathing/dressing/bathroom;Direct supervision/assist for medications management;Direct supervision/assist for financial management;Help with stairs or ramp for entrance ?  ?Equipment Recommendations ? None recommended by PT  ?  ?   ?Precautions / Restrictions Precautions ?Precautions: Fall ?Restrictions ?Weight Bearing Restrictions: No  ?  ? ?Mobility ? Bed Mobility ?Overal bed mobility: Needs Assistance ?Bed Mobility: Supine to Sit, Sit to Supine ?  ?  ?Supine to sit: Supervision ?Sit to supine: Supervision ?  ?  ?  ? ?Transfers ?Overall transfer level: Modified independent ?Equipment used: Rolling walker (2 wheels) ?Transfers: Sit to/from Stand ?Sit to Stand: Supervision ?  ?  Ambulation/Gait ?Ambulation/Gait assistance: Supervision ?Gait Distance (Feet): 500 Feet ?Assistive device: Rolling walker (2 wheels) ?Gait Pattern/deviations: Step-through pattern ?Gait velocity: decreased ?  ?  ?General Gait Details: pt ambulated 500 ft with RW and ~ 50 ft without AD. no LOB with use of RW however some unsteadiness without AD. recommend use of AD at DC. ? ? ?  ?Balance Overall balance assessment: Needs assistance ?Sitting-balance support: No upper extremity supported, Feet supported ?Sitting balance-Leahy Scale: Fair ?  ?  ?Standing balance support: No upper extremity supported, During functional activity ?Standing balance-Leahy Scale: Fair ?  ?  ?   ?Cognition Arousal/Alertness: Awake/alert ?Behavior During Therapy: [facility] for tasks assessed/performed ?Overall Cognitive Status: Within Functional Limits for tasks assessed ?  ?  ? General Comments: Pt is alert and oriented x 3. Seems much sharper today than previous date with awareness of situation and understanding of how he is doing overall. Much less coughing as well ?  ?  ? ?  ? ?Pertinent Vitals/Pain Pain Assessment ?Pain Assessment: No/denies pain  ? ? ? ?PT Goals (current goals can now be found in the care plan section) Acute Rehab PT Goals ?Patient Stated Goal: get home ?Progress towards PT goals: Progressing toward goals ? ?  ?Frequency ? ? ? Min 2X/week ? ? ? ?  ?PT Plan Current plan remains appropriate  ? ? ?Co-evaluation   ?  ?PT goals addressed during session: Mobility/safety with mobility;Balance;Proper use of DME;Strengthening/ROM ?  ?  ? ?  ?AM-PAC PT "6 Clicks" Mobility   ?Outcome Measure ? Help needed turning from your back to your side while in a flat bed without using bedrails?: A Little ?Help needed moving from lying on your back to sitting on  the side of a flat bed without using bedrails?: A Little ?Help needed moving to and from a bed to a chair (including a wheelchair)?: A Little ?Help needed standing up from a chair using your  arms (e.g., wheelchair or bedside chair)?: A Little ?Help needed to walk in hospital room?: A Little ?Help needed climbing 3-5 steps with a railing? : A Little ?6 Click Score: 18 ? ?  ?End of Session   ?Activity Tolerance: Patient tolerated treatment well ?Patient left: in bed;with call bell/phone within reach;with bed alarm set ?Nurse Communication: Mobility status ?PT Visit Diagnosis: Unsteadiness on feet (R26.81);Muscle weakness (generalized) (M62.81);Difficulty in walking, not elsewhere classified (R26.2) ?  ? ? ?Time: 3382-5053 ?PT Time Calculation (min) (ACUTE ONLY): 10 min ? ?Charges:  $Gait Training: 8-22 mins          ?          ? ?Julaine Fusi PTA ?02/24/22, 4:08 PM  ? ?

## 2022-02-24 NOTE — TOC Progression Note (Signed)
Transition of Care (TOC) - Progression Note  ? ? ?Patient Details  ?Name: Brett Blake ?MRN: [id number] ?Date of Birth: 08-12-1941 ? ?Transition of Care (TOC) CM/SW Contact  ?Conception Oms, RN ?Phone Number: ?02/24/2022, 9:20 AM ? ?Clinical Narrative:   Still no bed offers, resent the request ? ? ? ?Expected Discharge Plan: Home/Self Care ?Barriers to Discharge: Continued Medical Work up ? ?Expected Discharge Plan and Services ?Expected Discharge Plan: Home/Self Care ?  ?  ?Post Acute Care Choice: NA ?Living arrangements for the past 2 months: [place] ?                ?  ?  ?  ?  ?  ?  ?  ?  ?  ?  ? ? ?Social Determinants of Health (SDOH) Interventions ?  ? ?Readmission Risk Interventions ?No flowsheet data found. ? ?

## 2022-02-25 DIAGNOSIS — E876 Hypokalemia: Secondary | ICD-10-CM | POA: Diagnosis not present

## 2022-02-25 DIAGNOSIS — I10 Essential (primary) hypertension: Secondary | ICD-10-CM | POA: Diagnosis not present

## 2022-02-25 DIAGNOSIS — F1010 Alcohol abuse, uncomplicated: Secondary | ICD-10-CM | POA: Diagnosis not present

## 2022-02-25 DIAGNOSIS — F10931 Alcohol use, unspecified with withdrawal delirium: Secondary | ICD-10-CM | POA: Diagnosis not present

## 2022-02-25 DIAGNOSIS — R053 Chronic cough: Secondary | ICD-10-CM | POA: Diagnosis not present

## 2022-02-25 LAB — GLUCOSE, CAPILLARY
Glucose-Capillary: 129 mg/dL — ABNORMAL HIGH (ref 70–99)
Glucose-Capillary: 152 mg/dL — ABNORMAL HIGH (ref 70–99)
Glucose-Capillary: 136 mg/dL — ABNORMAL HIGH (ref 70–99)

## 2022-02-25 MED ORDER — RAMELTEON 8 MG PO TABS
8.0000 mg | ORAL_TABLET | Freq: Every day | ORAL | Status: DC
  Administered 2022-02-25 – 2022-02-27 (×3): 8 mg via ORAL
  Filled 2022-02-25 (×4): qty 1

## 2022-02-25 MED ORDER — QUETIAPINE FUMARATE 200 MG PO TABS
200.0000 mg | ORAL_TABLET | Freq: Every day | ORAL | Status: DC
  Administered 2022-02-25 – 2022-02-27 (×3): 200 mg via ORAL
  Filled 2022-02-25 (×4): qty 1

## 2022-02-25 NOTE — Assessment & Plan Note (Signed)
Family looking into alcohol rehabs ?

## 2022-02-25 NOTE — Consult Note (Cosign Needed)
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 male patient admitted with "intoxication" and breathing issues related to his COPD.  Patient seen and evaluated today, ten days after admission.  He is clear and coherent, hard of hearing.  Smiling appropriately on the assessment.  "I feel great", denies physical or mental issues.  Denies depression, suicidal ideations, withdrawal symptoms, etc.  He minimizes his drinking saying he typically does not drink but was having "bronchial asthma" 3 weeks ago and when the 10 day medication did not work he started self-medicating with his home remedy of vinegar, lemon, honey, and bourbon.  "I didn't get better and kept going."  This resulted in the completion of a 1/2 gallon of alcohol with passing out at his MD visit prior to admission.  Later in the conversation, he stated he drinks bourbon at night because he cannot sleep and has tried "everything" with no effects.  Based on his age, this provider recommends Romelteon PRN sleep.  Psychiatrically cleared.  HPI on admission: Patient seen and chart reviewed.  81 year old man brought to the  hospital with elevated blood alcohol level by his wife with worsening shortness of breath and cough.  Patient had reportedly made some comments about wishing that he would die.  Found the patient in the ICU arousable but clearly feeling sick.  Tremulous throughout much of the conversation but oriented to his situation.  Tells me that he is here for his drinking.  He says he has been drinking whiskey every night for years and it has gotten worse.  He started because he was using whiskey with lemon and honey as a treatment for his chronic cough.  Patient says his mood has been sad and down.  At times had passive suicidal thoughts.  Denies any intention to do himself any harm or actually wish to die at this point.  Denies being aware of any current visual hallucinations.  Past Psychiatric History: He mentions having been in rehab at least once in the past.  No identified previous specific psychiatric history no previous suicide attempts.  Risk to Self:  none Risk to Others:  none Prior Inpatient Therapy:  rehab Prior Outpatient Therapy:  none  Past Medical History:  Past Medical History:  Diagnosis Date   Anxiety    Arthritis    Asthma    Colon polyps    COPD (chronic obstructive pulmonary disease) ([place])    Depression    Diabetes ([place])    Difficult intubation    GERD (gastroesophageal reflux disease)    High cholesterol    Hyperlipidemia    Hypertension    Hyponatremia 02/23/2022   Sleep apnea  Past Surgical History:  Procedure Laterality Date   BRONCHOSCOPY     CARDIAC CATHETERIZATION Left 11/04/2016   Procedure: Left Heart Cath and Coronary Angiography;  Surgeon: Teodoro Spray, MD;  Location: [facility];  Service: Cardiovascular;  Laterality: Left;   CARDIAC CATHETERIZATION     CHOLECYSTECTOMY N/A 05/09/2019   Procedure: LAPAROSCOPIC CHOLECYSTECTOMY WITH INTRAOPERATIVE CHOLANGIOGRAM;  Surgeon: Jules Husbands, MD;  Location: [facility];  Service: General;  Laterality: N/A;    COLOSTOMY     LEFT HEART CATH AND CORONARY ANGIOGRAPHY N/A 12/14/2021   Procedure: LEFT HEART CATH AND CORONARY ANGIOGRAPHY;  Surgeon: Corey Skains, MD;  Location: [facility];  Service: Cardiovascular;  Laterality: N/A;   ROTATOR CUFF REPAIR Left    THUMB ARTHROSCOPY  2015   TONSILLECTOMY  1952   TONSILLECTOMY     Family History:  Family History  Problem Relation Age of Onset   Heart attack Mother    Prostate cancer Father    Leukemia Brother    Prostate cancer Brother    Family Psychiatric  History: None reported Social History:  Social History   Substance and Sexual Activity  Alcohol Use Yes   Alcohol/week: 1.0 - 2.0 standard drink   Types: 1 - 2 Shots of liquor per week     Social History   Substance and Sexual Activity  Drug Use No    Social History   Socioeconomic History   Marital status: Married    Spouse name: Not on file   Number of children: Not on file   Years of education: Not on file   Highest education level: Not on file  Occupational History   Not on file  Tobacco Use   Smoking status: Former    Packs/day: 1.00    Years: 15.00    Pack years: 15.00    Types: Cigarettes   Smokeless tobacco: Never   Tobacco comments:    quit 1977  Vaping Use   Vaping Use: Not on file  Substance and Sexual Activity   Alcohol use: Yes    Alcohol/week: 1.0 - 2.0 standard drink    Types: 1 - 2 Shots of liquor per week   Drug use: No   Sexual activity: Not Currently  Other Topics Concern   Not on file  Social History Narrative   Not on file   Social Determinants of Health   Financial Resource Strain: Not on file  Food Insecurity: Not on file  Transportation Needs: Not on file  Physical Activity: Not on file  Stress: Not on file  Social Connections: Not on file   Additional Social History:    Allergies:   Allergies  Allergen Reactions   Ace Inhibitors Cough   Metoprolol Diarrhea   Sulfa Antibiotics Rash    Labs:  Results for  orders placed or performed during the hospital encounter of 02/14/22 (from the past 48 hour(s))  Glucose, capillary     Status: Abnormal   Collection Time: 02/23/22  5:37 PM  Result Value Ref Range   Glucose-Capillary 185 (H) 70 - 99 mg/dL    Comment: Glucose reference range applies only to samples taken after fasting for at least 8 hours.  Glucose, capillary     Status: Abnormal   Collection Time: 02/23/22  8:21 PM  Result Value Ref Range   Glucose-Capillary 186 (H) 70 - 99 mg/dL    Comment: Glucose reference range applies only to samples taken after fasting for at least  8 hours.  Glucose, capillary     Status: Abnormal   Collection Time: 02/23/22  9:56 PM  Result Value Ref Range   Glucose-Capillary 145 (H) 70 - 99 mg/dL    Comment: Glucose reference range applies only to samples taken after fasting for at least 8 hours.  Glucose, capillary     Status: Abnormal   Collection Time: 02/24/22  8:28 AM  Result Value Ref Range   Glucose-Capillary 146 (H) 70 - 99 mg/dL    Comment: Glucose reference range applies only to samples taken after fasting for at least 8 hours.  Glucose, capillary     Status: Abnormal   Collection Time: 02/24/22  4:48 PM  Result Value Ref Range   Glucose-Capillary 153 (H) 70 - 99 mg/dL    Comment: Glucose reference range applies only to samples taken after fasting for at least 8 hours.   Comment 1 Notify RN    Comment 2 Document in Chart   Glucose, capillary     Status: Abnormal   Collection Time: 02/24/22  8:45 PM  Result Value Ref Range   Glucose-Capillary 164 (H) 70 - 99 mg/dL    Comment: Glucose reference range applies only to samples taken after fasting for at least 8 hours.  Glucose, capillary     Status: Abnormal   Collection Time: 02/25/22  7:46 AM  Result Value Ref Range   Glucose-Capillary 129 (H) 70 - 99 mg/dL    Comment: Glucose reference range applies only to samples taken after fasting for at least 8 hours.  Glucose, capillary     Status:  Abnormal   Collection Time: 02/25/22 11:57 AM  Result Value Ref Range   Glucose-Capillary 152 (H) 70 - 99 mg/dL    Comment: Glucose reference range applies only to samples taken after fasting for at least 8 hours.    Current Facility-Administered Medications  Medication Dose Route Frequency Provider Last Rate Last Admin   acetaminophen (TYLENOL) tablet 650 mg  650 mg Oral Q6H PRN Para Skeans, MD   650 mg at 02/22/22 1513   Or   acetaminophen (TYLENOL) suppository 650 mg  650 mg Rectal Q6H PRN Para Skeans, MD   650 mg at 02/17/22 2337   acetaminophen (TYLENOL) tablet 1,000 mg  1,000 mg Oral QHS Edwin Dada, MD   1,000 mg at 02/24/22 2121   albuterol (PROVENTIL) (2.5 MG/3ML) 0.083% nebulizer solution 1.25 mg  1.25 mg Nebulization Q6H PRN Para Skeans, MD   1.25 mg at 02/23/22 1309   benzonatate (TESSALON) capsule 200 mg  200 mg Oral TID Lorna Dibble, RPH   200 mg at 02/25/22 0254   Chlorhexidine Gluconate Cloth 2 % PADS 6 each  6 each Topical Daily Max Sane, MD   6 each at 02/24/22 1003   diltiazem (CARDIZEM CD) 24 hr capsule 240 mg  240 mg Oral q morning Florina Ou V, MD   240 mg at 02/25/22 0935   enoxaparin (LOVENOX) injection 40 mg  40 mg Subcutaneous QHS Lockie Mola B, RPH   40 mg at 02/24/22 2122   fluticasone (FLONASE) 50 MCG/ACT nasal spray 1 spray  1 spray Each Nare Daily PRN Para Skeans, MD       fluticasone (FLONASE) 50 MCG/ACT nasal spray 2 spray  2 spray Each Nare Daily Loletha Grayer, MD   2 spray at 27/06/23 7628   folic acid injection 1 mg  1 mg Intravenous Daily Danford, Suann Larry, MD  1 mg at 02/25/22 0932   gabapentin (NEURONTIN) capsule 300 mg  300 mg Oral QHS Edwin Dada, MD   300 mg at 02/24/22 2121   guaiFENesin (ROBITUSSIN) 100 MG/5ML liquid 5 mL  5 mL Oral Q4H PRN Shawna Clamp, MD   5 mL at 02/24/22 1941   hydrALAZINE (APRESOLINE) injection 10 mg  10 mg Intravenous Q6H PRN Val Riles, MD   10 mg at 02/20/22 0236    isosorbide mononitrate (IMDUR) 24 hr tablet 30 mg  30 mg Oral Daily Florina Ou V, MD   30 mg at 02/25/22 0931   LORazepam (ATIVAN) injection 1 mg  1 mg Intravenous Q4H PRN Edwin Dada, MD   1 mg at 02/20/22 2236   melatonin tablet 2.5 mg  2.5 mg Oral QHS Edwin Dada, MD   2.5 mg at 02/24/22 2121   mometasone-formoterol (DULERA) 100-5 MCG/ACT inhaler 2 puff  2 puff Inhalation BID Para Skeans, MD   2 puff at 02/25/22 0856   montelukast (SINGULAIR) tablet 10 mg  10 mg Oral QHS Florina Ou V, MD   10 mg at 02/24/22 2121   nitroGLYCERIN (NITROSTAT) SL tablet 0.4 mg  0.4 mg Sublingual Q5 min PRN Para Skeans, MD   0.4 mg at 02/20/22 0750   ondansetron (ZOFRAN) injection 4 mg  4 mg Intravenous Q6H PRN Edwin Dada, MD   4 mg at 02/20/22 0349   pantoprazole (PROTONIX) EC tablet 40 mg  40 mg Oral BID AC Danford, Suann Larry, MD   40 mg at 02/25/22 0855   PARoxetine (PAXIL) tablet 20 mg  20 mg Oral Daily Florina Ou V, MD   20 mg at 02/25/22 0931   polyethylene glycol (MIRALAX / GLYCOLAX) packet 17 g  17 g Oral Daily Edwin Dada, MD   17 g at 02/24/22 1002   propranolol (INDERAL) tablet 10 mg  10 mg Oral BID Edwin Dada, MD   10 mg at 02/25/22 0931   QUEtiapine (SEROQUEL) tablet 212.5 mg  212.5 mg Oral QHS Loletha Grayer, MD   212.5 mg at 02/24/22 2120   senna-docusate (Senokot-S) tablet 1 tablet  1 tablet Oral BID PRN Edwin Dada, MD   1 tablet at 02/22/22 1513   thiamine 250mg  in normal saline (40ml) IVPB  250 mg Intravenous Q24H Edwin Dada, MD 100 mL/hr at 02/25/22 0654 250 mg at 02/25/22 0654   Followed by   Derrill Memo ON 02/28/2022] thiamine (B-1) injection 100 mg  100 mg Intravenous Daily Danford, Suann Larry, MD       venlafaxine XR (EFFEXOR-XR) 24 hr capsule 37.5 mg  37.5 mg Oral Daily Para Skeans, MD   37.5 mg at 02/25/22 0930    Musculoskeletal: Strength & Muscle Tone:  Gait & Station: WDL Patient leans:  N/A  Psychiatric Specialty Exam: Physical Exam Vitals and nursing note reviewed.  Constitutional:      Appearance: Normal appearance. He is well-developed.  HENT:     Head: Normocephalic and atraumatic.  Pulmonary:     Effort: Pulmonary effort is normal.  Abdominal:     General: Abdomen is flat.  Neurological:     General: No focal deficit present.     Mental Status: He is alert and oriented to person, place, and time. Mental status is at baseline.  Psychiatric:        Attention and Perception: Attention and perception normal.        Mood and  Affect: Mood and affect normal.        Speech: Speech normal.        Behavior: Behavior normal. Behavior is cooperative.        Thought Content: Thought content normal.        Cognition and Memory: Cognition and memory normal.        Judgment: Judgment normal.    Review of Systems  Constitutional: Negative.   HENT: Negative.    Eyes: Negative.   Respiratory:  Positive for shortness of breath.   Cardiovascular: Negative.   Gastrointestinal: Negative.   Musculoskeletal: Negative.   Skin: Negative.   Neurological: Negative.   Psychiatric/Behavioral:  Negative for hallucinations and suicidal ideas. The patient has insomnia.    Blood pressure (!) 148/109, pulse 88, temperature 98.7 F (37.1 C), resp. rate 18, height 5\' 4"  (1.626 m), weight 76.3 kg, SpO2 98 %.Body mass index is 28.87 kg/m.  General Appearance: Casual  Eye Contact:  Good  Speech:  Clear and Coherent  Volume:  Normal  Mood:  Euthymic  Affect:  Congruent  Thought Process:  Coherent and Descriptions of Associations: Intact  Orientation:  Full (Time, Place, and Person)  Thought Content:  WDL and Logical  Suicidal Thoughts:  No  Homicidal Thoughts:  No  Memory:  Immediate;   Good Recent;   Good Remote;   Good  Judgement:  Fair  Insight:  Lacking  Psychomotor Activity:  Normal  Concentration:  Concentration: Good and Attention Span: Good  Recall:  Good  Fund of  Knowledge:  Good  Language:  Good  Akathisia:  No  Handed:  Right  AIMS (if indicated):     Assets:  Housing Leisure Time Physical Health Resilience Social Support  ADL's:  Intact  Cognition:  WNL  Sleep:       Physical Exam: Physical Exam Vitals and nursing note reviewed.  Constitutional:      Appearance: Normal appearance. He is well-developed.  HENT:     Head: Normocephalic and atraumatic.  Pulmonary:     Effort: Pulmonary effort is normal.  Abdominal:     General: Abdomen is flat.  Neurological:     General: No focal deficit present.     Mental Status: He is alert and oriented to person, place, and time. Mental status is at baseline.  Psychiatric:        Attention and Perception: Attention and perception normal.        Mood and Affect: Mood and affect normal.        Speech: Speech normal.        Behavior: Behavior normal. Behavior is cooperative.        Thought Content: Thought content normal.        Cognition and Memory: Cognition and memory normal.        Judgment: Judgment normal.   Review of Systems  Constitutional: Negative.   HENT: Negative.    Eyes: Negative.   Respiratory:  Positive for shortness of breath.   Cardiovascular: Negative.   Gastrointestinal: Negative.   Musculoskeletal: Negative.   Skin: Negative.   Neurological: Negative.   Psychiatric/Behavioral:  Negative for hallucinations and suicidal ideas. The patient has insomnia.   Blood pressure (!) 148/109, pulse 88, temperature 98.7 F (37.1 C), resp. rate 18, height 5\' 4"  (1.626 m), weight 76.3 kg, SpO2 98 %. Body mass index is 28.87 kg/m.  Treatment Plan Summary: Alcohol use disorder: -Recommend rehab or Intensive Outpatient for substance abuse  Insomnia: Rozerem 8  mg at bedtime, works with the melatonin in the body, well tolerated in the elderly; discontinued the regular melatonin  Disposition: No evidence of imminent risk to self or others at present.   Patient does not meet criteria  for psychiatric inpatient admission. Supportive therapy provided about ongoing stressors.  Waylan Boga, NP 02/25/2022 1:38 PM

## 2022-02-25 NOTE — TOC Progression Note (Signed)
Transition of Care (TOC) - Progression Note  ? ? ?Patient Details  ?Name: Brett Blake ?MRN: [id number] ?Date of Birth: 1941/02/15 ? ?Transition of Care (TOC) CM/SW Contact  ?Conception Oms, RN ?Phone Number: ?02/25/2022, 9:16 AM ?   ?Clinical Narrative:    ? ?No bed offers at this time, the patient can potentially get a bed offer after being cleared by Psych. ? ?Expected Discharge Plan: Home/Self Care ?Barriers to Discharge: Continued Medical Work up ? ?Expected Discharge Plan and Services ?Expected Discharge Plan: Home/Self Care ?  ?  ?Post Acute Care Choice: NA ?Living arrangements for the past 2 months: [place] ?                ?  ?  ?  ?  ?  ?  ?  ?  ?  ?  ? ? ?Social Determinants of Health (SDOH) Interventions ?  ? ?Readmission Risk Interventions ?No flowsheet data found. ? ?

## 2022-02-25 NOTE — Progress Notes (Signed)
Occupational Therapy Treatment ?Patient Details ?Name: Brett Blake ?MRN: [id number] ?DOB: 1941-12-15 ?Today's Date: 02/25/2022 ? ? ?History of present illness Pt is 81 y/o M admitted on 02/14/22 for treatment of alcohol withdrawal. PMH: COPD, alcohol abuse, GERD3, HTN, sleep apnea ?  ?OT comments ? Brett Blake was seen for OT treatment on this date. Upon arrival to room pt reclined in bed, agreeable to tx.  Pt requires SUPERVISION tooth brushing standing sink side. CGA for functional mobility and navigation task with no AD - pt tolerates ~500 ft mobility with MIN cues to locate correct room (pt passed his room twice without recognizing correct numbers). Pt making good progress toward goals. Pt continues to benefit from skilled OT services to maximize return to PLOF and minimize risk of future falls, injury, caregiver burden, and readmission. Will continue to follow POC. Discharge recommendation remains appropriate.  ?  ? ?Recommendations for follow up therapy are one component of a multi-disciplinary discharge planning process, led by the attending physician.  Recommendations may be updated based on patient status, additional functional criteria and insurance authorization. ?   ?Follow Up Recommendations ? Follow physician's recommendations for discharge plan and follow up therapies  ?  ?Assistance Recommended at Discharge Frequent or constant Supervision/Assistance  ?Patient can return home with the following ? A little help with walking and/or transfers;A little help with bathing/dressing/bathroom;Help with stairs or ramp for entrance ?  ?Equipment Recommendations ? BSC/3in1  ?  ?Recommendations for Other Services   ? ?  ?Precautions / Restrictions Precautions ?Precautions: Fall ?Restrictions ?Weight Bearing Restrictions: No  ? ? ?  ? ?Mobility Bed Mobility ?Overal bed mobility: Needs Assistance ?Bed Mobility: Supine to Sit, Sit to Supine ?  ?  ?Supine to sit: Supervision ?Sit to supine: Supervision ?  ?  ?   ? ?Transfers ?Overall transfer level: Needs assistance ?Equipment used: None ?Transfers: Sit to/from Stand ?Sit to Stand: Supervision ?  ?  ?  ?  ?  ?  ?  ?  ?Balance Overall balance assessment: Needs assistance ?Sitting-balance support: No upper extremity supported, Feet supported ?Sitting balance-Leahy Scale: Good ?  ?  ?Standing balance support: No upper extremity supported, During functional activity ?Standing balance-Leahy Scale: Fair ?  ?  ?  ?  ?  ?  ?  ?  ?  ?  ?  ?  ?   ? ?ADL either performed or assessed with clinical judgement  ? ?ADL Overall ADL's : Needs assistance/impaired ?  ?  ?  ?  ?  ?  ?  ?  ?  ?  ?  ?  ?  ?  ?  ?  ?  ?  ?  ?General ADL Comments: SUPERVISION tooth brushing standing sink side. CGA for functional mobility and navigation task - pt tolerates ~500 ft mobility with MIN cues to locate correct room ?  ? ? ? ?Cognition Arousal/Alertness: Awake/alert ?Behavior During Therapy: [facility] for tasks assessed/performed ?Overall Cognitive Status: No family/caregiver present to determine baseline cognitive functioning ?  ?  ?  ?  ?  ?  ?  ?  ?  ?  ?  ?  ?  ?  ?  ?  ?General Comments: MIN cues to complete navigation task of locating pt's room - pt walked past room x2 prior to identifying correctly with cues ?  ?  ?   ?   ?   ?   ? ? ?Pertinent Vitals/ Pain  Pain Assessment ?Pain Assessment: No/denies pain ? ? ?Frequency ? Min 2X/week  ? ? ? ? ?  ?Progress Toward Goals ? ?OT Goals(current goals can now be found in the care plan section) ? Progress towards OT goals: Progressing toward goals ? ?Acute Rehab OT Goals ?OT Goal Formulation: With patient ?Time For Goal Achievement: 03/07/22 ?Potential to Achieve Goals: Good ?ADL Goals ?Pt Will Perform Grooming: with min assist;standing ?Pt Will Transfer to Toilet: with min guard assist;ambulating;bedside commode ?Pt Will Perform Toileting - Clothing Manipulation and hygiene: with mod assist;sit to/from stand  ?Plan Discharge plan remains  appropriate;Frequency remains appropriate   ? ?Co-evaluation ? ? ?   ?  ?  ?  ?  ? ?  ?AM-PAC OT "6 Clicks" Daily Activity     ?Outcome Measure ? ? Help from another person eating meals?: None ?Help from another person taking care of personal grooming?: A Little ?Help from another person toileting, which includes using toliet, bedpan, or urinal?: A Little ?Help from another person bathing (including washing, rinsing, drying)?: A Little ?Help from another person to put on and taking off regular upper body clothing?: A Little ?Help from another person to put on and taking off regular lower body clothing?: A Little ?6 Click Score: 19 ? ?  ?End of Session Equipment Utilized During Treatment: Gait belt ? ?OT Visit Diagnosis: Unsteadiness on feet (R26.81);Other symptoms and signs involving cognitive function ?  ?Activity Tolerance Patient tolerated treatment well ?  ?Patient Left in bed;with call bell/phone within reach;with bed alarm set;with family/visitor present ?  ?Nurse Communication   ?  ? ?   ? ?Time: 5597-4163 ?OT Time Calculation (min): 15 min ? ?Charges: OT General Charges ?$OT Visit: 1 Visit ?OT Treatments ?$Self Care/Home Management : 8-22 mins ? ?Dessie Coma, M.S. OTR/L  ?02/25/22, 1:49 PM  ?ascom [phone] ? ?

## 2022-02-25 NOTE — TOC Progression Note (Signed)
Transition of Care (TOC) - Progression Note  ? ? ?Patient Details  ?Name: Brett Blake ?MRN: [id number] ?Date of Birth: [birth year] ? ?Transition of Care (TOC) CM/SW Contact  ?Conception Oms, RN ?Phone Number: ?02/25/2022, 9:26 AM ? ?Clinical Narrative:   Butch Penny the patient's daughter requested that I send clinical notes to [facility]  treatment rehab fax number [phone] ? ?Faxed H&P, progress notes and PT notes ? ? ?Expected Discharge Plan: Home/Self Care ?Barriers to Discharge: Continued Medical Work up ? ?Expected Discharge Plan and Services ?Expected Discharge Plan: Home/Self Care ?  ?  ?Post Acute Care Choice: NA ?Living arrangements for the past 2 months: [place] ?                ?  ?  ?  ?  ?  ?  ?  ?  ?  ?  ? ? ?Social Determinants of Health (SDOH) Interventions ?  ? ?Readmission Risk Interventions ?No flowsheet data found. ? ?

## 2022-02-25 NOTE — Progress Notes (Signed)
?Progress Note ? ? ?Patient: Brett Blake KZS:[id number] DOB: [birth year] DOA: 02/14/2022     11 ?DOS: the patient was seen and examined on 02/25/2022 ?  ?Brief hospital course: ?This 81 y.o. male with medical history significant of COPD, alcohol abuse, GERD, hypertension, sleep apnea admitted for alcohol withdrawal. ? ?2/21: Actively withdrawing on CIWA protocol. ?2/22: Patient remains sedated on Precedex drip. ?2/23: Patient still remains on Precedex drip, alert and following commands. ?2/24: Successfully off Precedex.  Patient alert, oriented x1 ,  still confused. ?2/25: Off precedex, oriented x3 ?2/26: Agitated and confused again, started phenobarbital ?2/27: Some agitation last night, but more sleepy and without agitation today, RASS -2 to -1 ? ?On 3/1 through 3/3 patient's mental status much improved and doing better with physical therapy.  Stable for disposition once we find a appropriate place. ? ?Assessment and Plan: ?* Delirium tremens ([place]) ?This has resolved.  Stable to go out to facility when bed available.  Patient on IV thiamine and will switch over to oral upon disposition. ? ? ? ? ?Cough ?Improved after starting nasal spray and cough suppression. ? ? ?Essential hypertension ?On Cardizem, Imdur and propranolol ? ?Weakness ?Continues to do well with physical therapy the last 2 days. ? ?Hypokalemia ?Supplemented and resolved ? ?Hypomagnesemia ?Supplemented and resolved ? ?AMS (altered mental status) ?This was acute metabolic encephalopathy due to delirium tremens, in setting of dementia.   ? ? ? ? ?Abnormal LFTs ?Resolved.  Due to alcohol. ? ? ?GERD (gastroesophageal reflux disease) ?Continue PPI  ? ?COPD (chronic obstructive pulmonary disease) ([place]) ?Continue albuterol,  Singulair, Dulera ? ?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[place]) ?Hold donepezil for now. Family wanted to go with low-dose Seroquel at night ? ?Pseudoseizures ([place]) ?EEG negative, MRI normal. ? ?Sleep apnea ?Continue nocturnal CPAP ? ?Chest  pain ?No further chest pain.  CT angio of the chest did not show any pulmonary embolism. ? 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  Can watch with diet control at this point. ? ?Chronic alcohol abuse ?Family looking into alcohol rehabs ? ? ? ? ? ? ? ?Subjective: Patient feeling okay.  Offers no complaints.  States he does not want to go back on alcohol.  States he slept well last night.  Admitted with alcohol withdrawal. ? ?Physical Exam: ?Vitals:  ? 02/24/22 2304 02/25/22 7322 02/25/22 0254 02/25/22 1632  ?BP: (!) 87/61 115/81 (!) 148/109 134/75  ?Pulse: 84 82 88 84  ?Resp: 17 16 18 17   ?Temp: 98 ?F (36.7 ?C) (!) 97.4 ?F (36.3 ?C) 98.7 ?F (37.1 ?C) 97.9 ?F (36.6 ?C)  ?TempSrc:      ?SpO2: 96% 97% 98% 100%  ?Weight:      ?Height:      ? ?Physical Exam ?HENT:  ?   Head: Normocephalic.  ?   Mouth/Throat:  ?   Pharynx: No oropharyngeal exudate.  ?Eyes:  ?   General: Lids are normal.  ?   Conjunctiva/sclera: Conjunctivae normal.  ?Cardiovascular:  ?   Rate and Rhythm: Normal rate and regular rhythm.  ?   Heart sounds: Normal heart sounds, S1 normal and S2 normal.  ?Pulmonary:  ?   Breath sounds: No decreased breath sounds, wheezing, rhonchi or rales.  ?Abdominal:  ?   Palpations: Abdomen is soft.  ?   Tenderness: There is no abdominal tenderness.  ?Musculoskeletal:  ?   Right lower leg: No swelling.  ?   Left lower leg: No swelling.  ?Skin: ?  General: Skin is warm.  ?   Findings: No rash.  ?Neurological:  ?   Mental Status: He is alert.  ?   Comments: Answers questions appropriately and able to straight leg raise.  ?  ? ?Data Reviewed: ?Last for sugars 164, 129, 152 and 136 ? ?Family Communication: Left message for patient's wife ? ?Disposition: ?Status is: Inpatient ?Remains inpatient appropriate because: Awaiting next disposition plan.  Stable to go whenever we obtain a bed ? ?Planned Discharge Destination: Skilled nursing facility ? ? ?Author: ?Loletha Grayer, MD ?02/25/2022  5:25 PM ? ?For on call review [email or web address].  ? ?

## 2022-02-26 DIAGNOSIS — I10 Essential (primary) hypertension: Secondary | ICD-10-CM | POA: Diagnosis not present

## 2022-02-26 DIAGNOSIS — F10931 Alcohol use, unspecified with withdrawal delirium: Secondary | ICD-10-CM | POA: Diagnosis not present

## 2022-02-26 DIAGNOSIS — F1010 Alcohol abuse, uncomplicated: Secondary | ICD-10-CM | POA: Diagnosis not present

## 2022-02-26 DIAGNOSIS — R053 Chronic cough: Secondary | ICD-10-CM | POA: Diagnosis not present

## 2022-02-26 LAB — GLUCOSE, CAPILLARY
Glucose-Capillary: 162 mg/dL — ABNORMAL HIGH (ref 70–99)
Glucose-Capillary: 196 mg/dL — ABNORMAL HIGH (ref 70–99)

## 2022-02-26 NOTE — Progress Notes (Signed)
?Progress Note ? ? ?Patient: Brett Blake VOZ:[id number] DOB: Jul 22, 1941 DOA: 02/14/2022     12 ?DOS: the patient was seen and examined on 02/26/2022 ?  ?Brief hospital course: ?This 81 y.o. male with medical history significant of COPD, alcohol abuse, GERD, hypertension, sleep apnea admitted for alcohol withdrawal. ? ?2/21: Actively withdrawing on CIWA protocol. ?2/22: Patient remains sedated on Precedex drip. ?2/23: Patient still remains on Precedex drip, alert and following commands. ?2/24: Successfully off Precedex.  Patient alert, oriented x1 ,  still confused. ?2/25: Off precedex, oriented x3 ?2/26: Agitated and confused again, started phenobarbital ?2/27: Some agitation last night, but more sleepy and without agitation today, RASS -2 to -1 ? ?On 3/1 through 3/4 patient's mental status much improved and doing better with physical therapy.  Stable for disposition once we find a appropriate place. ? ?Assessment and Plan: ?* Delirium tremens ([place]) ?This has resolved.  Stable to go out to facility when bed available.  Patient on IV thiamine and will switch over to oral upon disposition. ? ? ? ? ?Cough ?Improved after starting nasal spray and cough suppression. ? ? ?Essential hypertension ?On Cardizem, Imdur and propranolol ? ?Weakness ?Continues to do well with physical therapy the last 2 days. ? ?Hypokalemia ?Supplemented and resolved ? ?Hypomagnesemia ?Supplemented and resolved ? ?AMS (altered mental status) ?This was acute metabolic encephalopathy due to delirium tremens, in setting of dementia.   ? ? ? ? ?Abnormal LFTs ?Resolved.  Due to alcohol. ? ? ?GERD (gastroesophageal reflux disease) ?Continue PPI  ? ?COPD (chronic obstructive pulmonary disease) ([place]) ?Continue albuterol,  Singulair, Dulera ? ?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[place]) ?Hold donepezil for now. Family wanted to go with low-dose Seroquel at night ? ?Pseudoseizures ([place]) ?EEG negative, MRI normal. ? ?Sleep apnea ?Continue nocturnal CPAP ? ?Chest  pain ?No further chest pain.  CT angio of the chest did not show any pulmonary embolism. ? 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  Can watch with diet control at this point. ? ?Chronic alcohol abuse-resolved as of 02/15/2022 ?Family looking into alcohol rehabs ? ? ? ? ? ? ? ?Subjective: Patient awakened from sleep this morning.  Still having cough.  States he does not have any problems swallowing.  States his mouth is a little dry.  Admitted for alcohol withdrawal and delirium tremens. ? ?Physical Exam: ?Vitals:  ? 02/26/22 0422 02/26/22 0740 02/26/22 1153 02/26/22 1552  ?BP: (!) 161/89 (!) 165/87 136/83 120/82  ?Pulse: 84 74 79 92  ?Resp: '14 17 16 17  '$ ?Temp: 97.6 ?F (36.4 ?C) 97.6 ?F (36.4 ?C) 97.8 ?F (36.6 ?C) 98.3 ?F (36.8 ?C)  ?TempSrc:      ?SpO2: 95% 100% 97% 97%  ?Weight:      ?Height:      ? ?Physical Exam ?HENT:  ?   Head: Normocephalic.  ?   Mouth/Throat:  ?   Pharynx: No oropharyngeal exudate.  ?Eyes:  ?   General: Lids are normal.  ?   Conjunctiva/sclera: Conjunctivae normal.  ?Cardiovascular:  ?   Rate and Rhythm: Normal rate and regular rhythm.  ?   Heart sounds: Normal heart sounds, S1 normal and S2 normal.  ?Pulmonary:  ?   Breath sounds: No decreased breath sounds, wheezing, rhonchi or rales.  ?Abdominal:  ?   Palpations: Abdomen is soft.  ?   Tenderness: There is no abdominal tenderness.  ?Musculoskeletal:  ?   Right lower leg: No swelling.  ?   Left lower  leg: No swelling.  ?Skin: ?   General: Skin is warm.  ?   Findings: No rash.  ?Neurological:  ?   Mental Status: He is alert.  ?  ? ?Data Reviewed: No new laboratory data today Will order labs for tomorrow morning ? ?Family Communication: Spoke with patient's wife on the phone.  The patient's wife is looking into alcohol detox centers after rehab. ? ?Disposition: ?Status is: Inpatient ?Remains inpatient appropriate because: Awaiting insurance authorization for rehab ? ?Planned Discharge Destination:  Rehab ? ?Author: ?Loletha Grayer, MD ?02/26/2022 5:09 PM ? ?For on call review [email or web address].  ? ?

## 2022-02-27 DIAGNOSIS — F10931 Alcohol use, unspecified with withdrawal delirium: Secondary | ICD-10-CM | POA: Diagnosis not present

## 2022-02-27 DIAGNOSIS — R053 Chronic cough: Secondary | ICD-10-CM | POA: Diagnosis not present

## 2022-02-27 DIAGNOSIS — D638 Anemia in other chronic diseases classified elsewhere: Secondary | ICD-10-CM

## 2022-02-27 DIAGNOSIS — I10 Essential (primary) hypertension: Secondary | ICD-10-CM | POA: Diagnosis not present

## 2022-02-27 DIAGNOSIS — D649 Anemia, unspecified: Secondary | ICD-10-CM | POA: Diagnosis not present

## 2022-02-27 LAB — CBC
HCT: 30.8 % — ABNORMAL LOW (ref 39.0–52.0)
Hemoglobin: 10.2 g/dL — ABNORMAL LOW (ref 13.0–17.0)
MCH: 32.3 pg (ref 26.0–34.0)
MCHC: 33.1 g/dL (ref 30.0–36.0)
MCV: 97.5 fL (ref 80.0–100.0)
Platelets: 398 10*3/uL (ref 150–400)
RBC: 3.16 MIL/uL — ABNORMAL LOW (ref 4.22–5.81)
RDW: 13.6 % (ref 11.5–15.5)
WBC: 2.7 10*3/uL — ABNORMAL LOW (ref 4.0–10.5)
nRBC: 0 % (ref 0.0–0.2)

## 2022-02-27 LAB — BASIC METABOLIC PANEL
Anion gap: 10 (ref 5–15)
BUN: 15 mg/dL (ref 8–23)
CO2: 27 mmol/L (ref 22–32)
Calcium: 9.1 mg/dL (ref 8.9–10.3)
Chloride: 100 mmol/L (ref 98–111)
Creatinine, Ser: 0.8 mg/dL (ref 0.61–1.24)
GFR, Estimated: >60 mL/min (ref >60)
Glucose, Bld: 158 mg/dL — ABNORMAL HIGH (ref 70–99)
Potassium: 3.8 mmol/L (ref 3.5–5.1)
Sodium: 137 mmol/L (ref 135–145)

## 2022-02-27 LAB — IRON AND TIBC
Iron: 46 ug/dL (ref 45–182)
Saturation Ratios: 20 % (ref 17.9–39.5)
TIBC: 235 ug/dL — ABNORMAL LOW (ref 250–450)
UIBC: 189 ug/dL

## 2022-02-27 LAB — FERRITIN: Ferritin: 404 ng/mL — ABNORMAL HIGH (ref 24–336)

## 2022-02-27 MED ORDER — THIAMINE HCL 100 MG PO TABS
100.0000 mg | ORAL_TABLET | Freq: Every day | ORAL | Status: DC
  Administered 2022-02-28: 100 mg via ORAL
  Filled 2022-02-27: qty 1

## 2022-02-27 NOTE — Progress Notes (Addendum)
?Progress Note ? ? ?Patient: Brett Blake URK:[id number] DOB: [birth year] DOA: 02/14/2022     13 ?DOS: the patient was seen and examined on 02/27/2022 ?  ?Brief hospital course: ?This 81 y.o. male with medical history significant of COPD, alcohol abuse, GERD, hypertension, sleep apnea admitted for alcohol withdrawal. ? ?2/21: Actively withdrawing on CIWA protocol. ?2/22: Patient remains sedated on Precedex drip. ?2/23: Patient still remains on Precedex drip, alert and following commands. ?2/24: Successfully off Precedex.  Patient alert, oriented x1 ,  still confused. ?2/25: Off precedex, oriented x3 ?2/26: Agitated and confused again, started phenobarbital ?2/27: Some agitation last night, but more sleepy and without agitation today, RASS -2 to -1 ? ?On 3/1 through 3/5 patient's mental status much improved and doing better with physical therapy.  Stable for disposition once we find a appropriate place.  Patient sleeping better since placed on Seroquel. ? ?Assessment and Plan: ?* Delirium tremens ([place]) ?This has resolved.  Change thiamine over to oral for tomorrow.  Patient much improved since admission. ? ?Cough ?Improved after starting nasal spray and cough suppression.  No coughing today while is in the room. ? ?Essential hypertension ?Continue Cardizem, Imdur and propranolol ? ?Anemia, unspecified ?Hemoglobin 10.2.  Add on ferritin and TIBC. ? ?Weakness ?Continues to do well with physical therapy the last 2 days. ? ?Hypokalemia ?Supplemented and resolved ? ?Hypomagnesemia ?Supplemented and resolved ? ?AMS (altered mental status) ?This was acute metabolic encephalopathy due to delirium tremens, in setting of dementia.   ? ?Abnormal LFTs ?Resolved.  Due to alcohol. ? ?GERD (gastroesophageal reflux disease) ?Continue PPI  ? ?COPD (chronic obstructive pulmonary disease) ([place]) ?Continue albuterol,  Singulair, Dulera ? ?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[place]) ?Hold donepezil for now.  Patient sleeping well with low-dose  Seroquel at night. ? ?Pseudoseizures ([place]) ?EEG negative, MRI normal. ? ?Sleep apnea ?Continue nocturnal CPAP ? ?Chest pain ?CT angio of the chest did not show any pulmonary embolism. ? 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  Can watch with diet control at this point. ? ?Chronic alcohol abuse: ?Family looking into alcohol rehabs also ? ? ? ? ? ? ? ?Subjective: Patient states he has been sleeping well for the last 3 nights.  He feels okay and offers no complaints.  Cough is a little bit less. ? ?Physical Exam: ?Vitals:  ? 02/26/22 1552 02/26/22 2104 02/27/22 0356 02/27/22 0801  ?BP: 120/82 (!) 143/92 114/79 (!) 139/96  ?Pulse: 92 85 73 83  ?Resp: '17 20  18  '$ ?Temp: 98.3 ?F (36.8 ?C) 97.9 ?F (36.6 ?C)  98.3 ?F (36.8 ?C)  ?TempSrc:      ?SpO2: 97% 98% 94% 97%  ?Weight:      ?Height:      ? ?Physical Exam ?HENT:  ?   Head: Normocephalic.  ?   Mouth/Throat:  ?   Pharynx: No oropharyngeal exudate.  ?Eyes:  ?   General: Lids are normal.  ?   Conjunctiva/sclera: Conjunctivae normal.  ?Cardiovascular:  ?   Rate and Rhythm: Normal rate and regular rhythm.  ?   Heart sounds: Normal heart sounds, S1 normal and S2 normal.  ?Pulmonary:  ?   Breath sounds: No decreased breath sounds, wheezing, rhonchi or rales.  ?Abdominal:  ?   Palpations: Abdomen is soft.  ?   Tenderness: There is no abdominal tenderness.  ?Musculoskeletal:  ?   Right lower leg: No swelling.  ?   Left lower leg: No swelling.  ?Skin: ?  General: Skin is warm.  ?   Findings: No rash.  ?Neurological:  ?   Mental Status: He is alert.  ?  ? ?Data Reviewed: ?White blood cell count 2.7 and hemoglobin 10.2. ? ?Family Communication: Left message for patient's wife ? ?Disposition: ?Status is: Inpatient ?Remains inpatient appropriate because: Awaiting for insurance authorization for rehab.  Medically stable for disposition ? ?Planned Discharge Destination: Rehab ? ?Author: ?Loletha Grayer, MD ?02/27/2022 12:39 PM ? ?For on call  review [email or web address].  ? ?

## 2022-02-27 NOTE — Assessment & Plan Note (Addendum)
Last hemoglobin 10.2.  Ferritin elevated going along with anemia of chronic disease. ?

## 2022-02-28 DIAGNOSIS — F10931 Alcohol use, unspecified with withdrawal delirium: Secondary | ICD-10-CM | POA: Diagnosis not present

## 2022-02-28 DIAGNOSIS — D638 Anemia in other chronic diseases classified elsewhere: Secondary | ICD-10-CM

## 2022-02-28 DIAGNOSIS — I10 Essential (primary) hypertension: Secondary | ICD-10-CM | POA: Diagnosis not present

## 2022-02-28 DIAGNOSIS — R053 Chronic cough: Secondary | ICD-10-CM | POA: Diagnosis not present

## 2022-02-28 MED ORDER — POLYETHYLENE GLYCOL 3350 17 G PO PACK: 17.0000 g | PACK | Freq: Every day | ORAL | 0 refills | Status: AC | PRN

## 2022-02-28 MED ORDER — FOLIC ACID 1 MG PO TABS
1.0000 mg | ORAL_TABLET | Freq: Every day | ORAL | Status: DC
  Administered 2022-02-28: 1 mg via ORAL
  Filled 2022-02-28: qty 1

## 2022-02-28 MED ORDER — MONTELUKAST SODIUM 10 MG PO TABS: 10.0000 mg | ORAL_TABLET | Freq: Every day | ORAL | 0 refills | Status: DC

## 2022-02-28 MED ORDER — PROPRANOLOL HCL 10 MG PO TABS: 10.0000 mg | ORAL_TABLET | Freq: Two times a day (BID) | ORAL | 0 refills | Status: DC

## 2022-02-28 MED ORDER — MELATONIN 5 MG PO TABS: 5.0000 mg | ORAL_TABLET | Freq: Every day | ORAL | 0 refills | Status: AC

## 2022-02-28 MED ORDER — PANTOPRAZOLE SODIUM 40 MG PO TBEC: 40.0000 mg | DELAYED_RELEASE_TABLET | Freq: Two times a day (BID) | ORAL | 0 refills | Status: DC

## 2022-02-28 MED ORDER — GABAPENTIN 300 MG PO CAPS: 300.0000 mg | ORAL_CAPSULE | Freq: Every day | ORAL | 0 refills | Status: AC

## 2022-02-28 MED ORDER — GUAIFENESIN 100 MG/5ML PO LIQD
5.0000 mL | ORAL | Status: DC | PRN
  Administered 2022-02-28: 5 mL via ORAL
  Filled 2022-02-28 (×2): qty 10

## 2022-02-28 MED ORDER — QUETIAPINE FUMARATE 25 MG PO TABS: ORAL_TABLET | ORAL | 0 refills | Status: AC

## 2022-02-28 MED ORDER — BENZONATATE 200 MG PO CAPS: 200.0000 mg | ORAL_CAPSULE | Freq: Three times a day (TID) | ORAL | 0 refills | Status: DC | PRN

## 2022-02-28 MED ORDER — THIAMINE HCL 100 MG PO TABS: 100.0000 mg | ORAL_TABLET | Freq: Every day | ORAL | 0 refills | Status: DC

## 2022-02-28 MED ORDER — GUAIFENESIN 100 MG/5ML PO LIQD: 5.0000 mL | ORAL | 0 refills | Status: AC | PRN

## 2022-02-28 MED ORDER — FOLIC ACID 1 MG PO TABS: 1.0000 mg | ORAL_TABLET | Freq: Every day | ORAL | 0 refills | Status: AC

## 2022-02-28 NOTE — Discharge Summary (Signed)
Physician Discharge Summary   Patient: Brett Blake MRN: [id number] DOB: Feb 14, 1941  Admit date:     02/14/2022  Discharge date: 02/28/22  Discharge Physician: Loletha Grayer   PCP: [name], MD   Recommendations at discharge:   Follow-up PCP 5 days  Discharge Diagnoses: Principal Problem:   Delirium tremens ([place]) Active Problems:   Cough   Essential hypertension   Anemia of chronic disease   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 (HCC)   Chest pain   Sleep apnea   Pseudoseizures (HCC)   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HCC)   COPD (chronic obstructive pulmonary disease) (HCC)   GERD (gastroesophageal reflux disease)   Abnormal LFTs   AMS (altered mental status)   Hypomagnesemia   Hypokalemia   Hyponatremia    Hospital Course: This 81 y.o. male with medical history significant of COPD, alcohol abuse, GERD, hypertension, sleep apnea admitted for alcohol withdrawal.  2/21: Actively withdrawing on CIWA protocol. 2/22: Patient remains sedated on Precedex drip. 2/23: Patient still remains on Precedex drip, alert and following commands. 2/24: Successfully off Precedex.  Patient alert, oriented x1 ,  still confused. 2/25: Off precedex, oriented x3 2/26: Agitated and confused again, started phenobarbital 2/27: Some agitation last night, but more sleepy and without agitation today, RASS -2 to -1  On 3/1 through 3/6 patient's mental status much improved and doing better with physical therapy.  Patient was rejected for rehab.  Patient's wife still looking into alcohol rehabs for him.  Assessment and Plan: * Delirium tremens ([place]) This has resolved.  Change thiamine over to oral for tomorrow.  Patient much improved since admission.  Family looking into alcohol rehabs for him as outpatient.  Cough Improved after starting nasal spray and cough suppression.  No coughing today while is in the room.  Essential hypertension Continue Cardizem, Imdur and  propranolol  Anemia, of chronic disease Hemoglobin 10.2.  Ferritin elevated going along with anemia of chronic disease.  Hypokalemia Supplemented and resolved  Hypomagnesemia Supplemented and resolved  AMS (altered mental status) This was acute metabolic encephalopathy due to delirium tremens, in setting of dementia.    Abnormal LFTs Resolved.  Due to alcohol.  GERD (gastroesophageal reflux disease) Continue PPI   COPD (chronic obstructive pulmonary disease) (HCC) Continue albuterol,  Singulair, Dulera  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[place]) Hold donepezil for now.  Patient sleeping well with low-dose Seroquel at night.  Pseudoseizures ([place]) EEG negative, MRI normal.  Sleep apnea Continue nocturnal CPAP  Chest pain CT angio of the chest did not show any pulmonary embolism.  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  Can watch with diet control at this point.           Consultants: Critical care specialist, psychiatry Procedures performed: None Disposition: Home health Diet recommendation:  Cardiac and Carb modified diet DISCHARGE MEDICATION: Allergies as of 02/28/2022       Reactions   Ace Inhibitors Cough   Metoprolol Diarrhea   Sulfa Antibiotics Rash        Medication List     STOP taking these medications    ALPRAZolam 0.5 MG tablet Commonly known as: XANAX   donepezil 5 MG tablet Commonly known as: ARICEPT   gentamicin 0.3 % ophthalmic solution Commonly known as: GARAMYCIN   glucosamine-chondroitin 500-400 MG tablet   ibuprofen 200 MG tablet Commonly known as: ADVIL   meclizine 25 MG tablet Commonly known as: ANTIVERT   omeprazole 40 MG capsule Commonly known  as: PRILOSEC   PARoxetine 20 MG tablet Commonly known as: PAXIL   rosuvastatin 5 MG tablet Commonly known as: CRESTOR       TAKE these medications    albuterol 1.25 MG/3ML nebulizer solution Commonly known as: ACCUNEB TAKE 3 MLS  (1.25 MG TOTAL) BY NEBULIZATION EVERY 6 (SIX) HOURS AS NEEDED FOR WHEEZING   benzonatate 200 MG capsule Commonly known as: TESSALON Take 1 capsule (200 mg total) by mouth 3 (three) times daily as needed for cough.   diltiazem 240 MG 24 hr capsule Commonly known as: CARDIZEM CD Take 240 mg by mouth every morning.   fluticasone 50 MCG/ACT nasal spray Commonly known as: FLONASE Place 1 spray into both nostrils daily as needed for allergies or rhinitis.   folic acid 1 MG tablet Commonly known as: FOLVITE Take 1 tablet (1 mg total) by mouth daily. Start taking on: March 01, 2022   gabapentin 300 MG capsule Commonly known as: NEURONTIN Take 1 capsule (300 mg total) by mouth at bedtime.   guaiFENesin 100 MG/5ML liquid Commonly known as: ROBITUSSIN Take 5 mLs by mouth every 4 (four) hours as needed for cough or to loosen phlegm.   isosorbide mononitrate 30 MG 24 hr tablet Commonly known as: IMDUR Take 30 mg by mouth daily.   melatonin 5 MG Tabs Take 1 tablet (5 mg total) by mouth at bedtime.   montelukast 10 MG tablet Commonly known as: SINGULAIR Take 1 tablet (10 mg total) by mouth at bedtime.   MULTIVITAMIN PO Take 1 tablet by mouth daily.   nitroGLYCERIN 0.4 MG SL tablet Commonly known as: NITROSTAT Place 0.4 mg under the tongue every 5 (five) minutes as needed for chest pain.   pantoprazole 40 MG tablet Commonly known as: PROTONIX Take 1 tablet (40 mg total) by mouth 2 (two) times daily before a meal.   polyethylene glycol 17 g packet Commonly known as: MIRALAX / GLYCOLAX Take 17 g by mouth daily as needed for moderate constipation.   propranolol 10 MG tablet Commonly known as: INDERAL Take 1 tablet (10 mg total) by mouth 2 (two) times daily. What changed:  medication strength how much to take   QUEtiapine 25 MG tablet Commonly known as: SEROquel One tab po qhs (can take extra dose if not sleeping)   Symbicort 80-4.5 MCG/ACT inhaler Generic drug:  budesonide-formoterol Inhale 2 puffs into the lungs 2 (two) times daily as needed.   thiamine 100 MG tablet Take 1 tablet (100 mg total) by mouth daily. Start taking on: March 01, 2022   venlafaxine XR 37.5 MG 24 hr capsule Commonly known as: EFFEXOR-XR Take 37.5 mg by mouth daily.        Follow-up Information     [name], MD Follow up in 5 day(s).   Specialty: Internal Medicine Contact information: [address] [place] [address] [phone]                Discharge Exam: Filed Weights   02/14/22 1345 02/14/22 2336 02/16/22 0215  Weight: 73.7 kg 76.2 kg 76.3 kg   Physical Exam HENT:     Head: Normocephalic.     Mouth/Throat:     Pharynx: No oropharyngeal exudate.  Eyes:     General: Lids are normal.     Conjunctiva/sclera: Conjunctivae normal.  Cardiovascular:     Rate and Rhythm: Normal rate and regular rhythm.     Heart sounds: Normal heart sounds, S1 normal and S2 normal.  Pulmonary:  Breath sounds: No decreased breath sounds, wheezing, rhonchi or rales.  Abdominal:     Palpations: Abdomen is soft.     Tenderness: There is no abdominal tenderness.  Musculoskeletal:     Right lower leg: No swelling.     Left lower leg: No swelling.  Skin:    General: Skin is warm.     Findings: No rash.  Neurological:     Mental Status: He is alert.     Condition at discharge: stable  The results of significant diagnostics from this hospitalization (including imaging, microbiology, ancillary and laboratory) are listed below for reference.   Imaging Studies: DG Chest 2 View  Result Date: 02/23/2022 CLINICAL DATA:  Shortness of breath. EXAM: CHEST - 2 VIEW COMPARISON:  CTA chest and chest x-ray dated February 14, 2022. FINDINGS: The heart size and mediastinal contours are within normal limits. Both lungs are clear. The visualized skeletal structures are unremarkable. IMPRESSION: No active cardiopulmonary disease. Electronically Signed   By:  Titus Dubin M.D.   On: 02/23/2022 15:18   DG Chest 2 View  Result Date: 02/14/2022 CLINICAL DATA:  Cough.  Alcohol intoxication.  Shortness of breath. EXAM: CHEST - 2 VIEW COMPARISON:  02/10/2021 FINDINGS: Heart size is normal. Lungs are free of focal consolidations and pleural effusions. No pulmonary edema. Calcified LEFT LOWER lobe granuloma. Midthoracic degenerative changes. IMPRESSION: No active cardiopulmonary disease. Electronically Signed   By: Nolon Nations M.D.   On: 02/14/2022 14:48   CT HEAD WO CONTRAST (5MM)  Result Date: 02/14/2022 CLINICAL DATA:  Head trauma. Unsure if a fall or collapsed. Found on the floor. EXAM: CT HEAD WITHOUT CONTRAST TECHNIQUE: Contiguous axial images were obtained from the base of the skull through the vertex without intravenous contrast. RADIATION DOSE REDUCTION: This exam was performed according to the departmental dose-optimization program which includes automated exposure control, adjustment of the mA and/or kV according to patient size and/or use of iterative reconstruction technique. COMPARISON:  Head CT 08/18/2021 FINDINGS: Brain: There is mild cortical atrophy, within normal limits for patient age. The ventricles are normal in configuration. The basilar cisterns are patent. No mass, mass effect, or midline shift. Calcification in the region of the left thalamus is unchanged. No acute intracranial hemorrhage is seen. No abnormal extra-axial fluid collection. Mild periventricular white matter hypodensities nonspecific but most likely secondary to chronic ischemic white matter changes. Preservation of the normal cortical gray-white interface without CT evidence of an acute major vascular territorial cortical based infarction. Vascular: Mild atherosclerotic calcifications within the intracranial base of skull arteries. Skull: Normal. Negative for fracture or focal lesion. Sinuses/Orbits: Status post bilateral ocular lens replacements. Mild inferior right  mastoid air cell mucosal opacification. The left mastoid air cells and visualized paranasal sinuses are clear. Other: None. IMPRESSION:: IMPRESSION: 1. No acute intracranial process. 2. Mild chronic ischemic white matter changes. Electronically Signed   By: Yvonne Kendall M.D.   On: 02/14/2022 14:49   CT Angio Chest PE W and/or Wo Contrast  Result Date: 02/14/2022 CLINICAL DATA:  Pulmonary embolism suspected, high probability. Cough, shortness of breath EXAM: CT ANGIOGRAPHY CHEST WITH CONTRAST TECHNIQUE: Multidetector CT imaging of the chest was performed using the standard protocol during bolus administration of intravenous contrast. Multiplanar CT image reconstructions and MIPs were obtained to evaluate the vascular anatomy. RADIATION DOSE REDUCTION: This exam was performed according to the departmental dose-optimization program which includes automated exposure control, adjustment of the mA and/or kV according to patient size and/or use of iterative  reconstruction technique. CONTRAST:  15m OMNIPAQUE IOHEXOL 350 MG/ML SOLN COMPARISON:  Chest radiograph performed earlier on the same date FINDINGS: Cardiovascular: Satisfactory opacification of the pulmonary arteries to the segmental level. No evidence of central pulmonary embolism, evaluation of distal segmental and subsegmental pulmonary arteries is limited due to respiratory motion and phase of contrast enhancement. Heart is enlarged. No pericardial effusion. Mediastinum/Nodes: No enlarged mediastinal, hilar, or axillary lymph nodes. Thyroid gland, trachea, and esophagus demonstrate no significant findings. Lungs/Pleura: Lungs are clear. No pleural effusion or pneumothorax. Upper Abdomen: No acute abnormality.  Cholecystectomy changes. Musculoskeletal: Degenerate disc disease of the thoracic spine. No acute osseous abnormality. Review of the MIP images confirms the above findings. IMPRESSION: 1. No evidence of central pulmonary embolism. Evaluation of distal  segmental and subsegmental pulmonary arteries is somewhat limited due to respiratory motion. 2. Lungs are clear without evidence of focal consolidation or pleural effusion. Electronically Signed   By: IKeane PoliceD.O.   On: 02/14/2022 17:23    Microbiology: Results for orders placed or performed during the hospital encounter of 02/14/22  Resp Panel by RT-PCR (Flu A&B, Covid) Nasopharyngeal Swab     Status: None   Collection Time: 02/14/22  1:49 PM   Specimen: Nasopharyngeal Swab; Nasopharyngeal(NP) swabs in vial transport medium  Result Value Ref Range Status   SARS Coronavirus 2 by RT PCR NEGATIVE NEGATIVE Final    Comment: (NOTE) SARS-CoV-2 target nucleic acids are NOT DETECTED.  The SARS-CoV-2 RNA is generally detectable in upper respiratory specimens during the acute phase of infection. The lowest concentration of SARS-CoV-2 viral copies this assay can detect is 138 copies/mL. A negative result does not preclude SARS-Cov-2 infection and should not be used as the sole basis for treatment or other patient management decisions. A negative result may occur with  improper specimen collection/handling, submission of specimen other than nasopharyngeal swab, presence of viral mutation(s) within the areas targeted by this assay, and inadequate number of viral copies(<138 copies/mL). A negative result must be combined with clinical observations, patient history, and epidemiological information. The expected result is Negative.  Fact Sheet for Patients:  hEntrepreneurPulse.com.au Fact Sheet for Healthcare Providers:  hIncredibleEmployment.be This test is no t yet approved or cleared by the [place] and  has been authorized for detection and/or diagnosis of SARS-CoV-2 by FDA under an Emergency Use Authorization (EUA). This EUA will remain  in effect (meaning this test can be used) for the duration of the COVID-19 declaration under Section  564(b)(1) of the Act, 21 U.S.C.section 360bbb-3(b)(1), unless the authorization is terminated  or revoked sooner.       Influenza A by PCR NEGATIVE NEGATIVE Final   Influenza B by PCR NEGATIVE NEGATIVE Final    Comment: (NOTE) The Xpert Xpress SARS-CoV-2/FLU/RSV plus assay is intended as an aid in the diagnosis of influenza from Nasopharyngeal swab specimens and should not be used as a sole basis for treatment. Nasal washings and aspirates are unacceptable for Xpert Xpress SARS-CoV-2/FLU/RSV testing.  Fact Sheet for Patients: hEntrepreneurPulse.com.au Fact Sheet for Healthcare Providers: hIncredibleEmployment.be This test is not yet approved or cleared by the [place] and has been authorized for detection and/or diagnosis of SARS-CoV-2 by FDA under an Emergency Use Authorization (EUA). This EUA will remain in effect (meaning this test can be used) for the duration of the COVID-19 declaration under Section 564(b)(1) of the Act, 21 U.S.C. section 360bbb-3(b)(1), unless the authorization is terminated or revoked.  Performed at [facility], [address] [place], [place] [address]   Blood culture (routine x 2)     Status: None   Collection Time: 02/14/22  7:21 PM   Specimen: BLOOD  Result Value Ref Range Status   Specimen Description BLOOD LAC  Final   Special Requests BOTTLES DRAWN AEROBIC AND ANAEROBIC BCAV  Final   Culture   Final    NO GROWTH 5 DAYS Performed at [facility], [address] [place], [place] [address]    Report Status 02/19/2022 FINAL  Final  Blood culture (routine x 2)     Status: None   Collection Time: 02/14/22  7:29 PM   Specimen: BLOOD  Result Value Ref Range Status   Specimen Description BLOOD RAC  Final   Special Requests BOTTLES DRAWN AEROBIC AND ANAEROBIC BCLV  Final   Culture   Final    NO GROWTH 5 DAYS Performed at [facility], [address] [place],  [place] [address]    Report Status 02/19/2022 FINAL  Final  MRSA Next Gen by PCR, Nasal     Status: None   Collection Time: 02/16/22  4:09 AM   Specimen: Nasal Mucosa; Nasal Swab  Result Value Ref Range Status   MRSA by PCR Next Gen NOT DETECTED NOT DETECTED Final    Comment: (NOTE) The GeneXpert MRSA Assay (FDA approved for NASAL specimens only), is one component of a comprehensive MRSA colonization surveillance program. It is not intended to diagnose MRSA infection nor to guide or monitor treatment for MRSA infections. Test performance is not FDA approved in patients less than 41 years old. Performed at [facility], [address] [place], [place] [address]     Labs: CBC: Recent Labs  Lab 02/22/22 0500 02/23/22 0446 02/27/22 0445  WBC 7.1 6.6 2.7*  HGB 9.9* 10.3* 10.2*  HCT 28.8* 30.4* 30.8*  MCV 95.7 95.0 97.5  PLT 207 254 920   Basic Metabolic Panel: Recent Labs  Lab 02/22/22 0500 02/23/22 0446 02/27/22 0445  NA 131* 131* 137  K 4.1 3.8 3.8  CL 97* 97* 100  CO2 '25 27 27  '$ GLUCOSE 119* 145* 158*  BUN '9 9 15  '$ CREATININE 0.81 0.74 0.80  CALCIUM 8.8* 8.7* 9.1    CBG: Recent Labs  Lab 02/25/22 0746 02/25/22 1157 02/25/22 1636 02/26/22 0747 02/26/22 1215  GLUCAP 129* 152* 136* 162* 196*    Discharge time spent: greater than 30 minutes.  Signed: Loletha Grayer, MD [facility] 02/28/2022

## 2022-02-28 NOTE — Care Management Important Message (Signed)
Important Message ? ?Patient Details  ?Name: Brett Blake ?MRN: [id number] ?Date of Birth: [birth year] ? ? ?Medicare Important Message Given:  Yes ? ? ? ? ?Juliann Pulse A Jermone Geister ?02/28/2022, 12:33 PM ?

## 2022-02-28 NOTE — Progress Notes (Signed)
Physical Therapy Treatment ?Patient Details ?Name: Brett Blake ?MRN: [id number] ?DOB: [birth year] ?Today's Date: 02/28/2022 ? ? ?History of Present Illness Pt is 81 y/o M admitted on 02/14/22 for treatment of alcohol withdrawal. PMH: COPD, alcohol abuse, GERD3, HTN, sleep apnea ? ?  ?PT Comments  ? ? Pt seen for PT tx with pt agreeable. Pt with improving cognition compared to last time this PT saw him. Pt reports he plans to quit drinking once he goes home, as he only used it to help him sleep better anyway. Pt does endorse his balance is not great & completed Berg Balance Test. Pt scored 44/56 & PT educated pt on interpretation of score & current fall risk. Educated pt on need for supervision & to use RW to mobilize at home & while pt able to recall this later during session, he does not implement it as he attempts to ambulate in room without AD. Pt does require Min assist when ambulating to bathroom without AD but frequently reaching for objects for support. Pt with continent BM on toilet. Pt also ambulates 2 laps with RW & supervision during session. Pt would benefit from f/u therapy to address balance & gait with LRAD to reduce fall risk. ? ?Patient demonstrates increased fall risk as noted by score of 44/56 on Berg Balance Scale.  (<36= high risk for falls, close to 100%; 37-45 significant >80%; 46-51 moderate >50%; 52-55 lower >25%) ? ?   ?Recommendations for follow up therapy are one component of a multi-disciplinary discharge planning process, led by the attending physician.  Recommendations may be updated based on patient status, additional functional criteria and insurance authorization. ? ?Follow Up Recommendations ? Home health PT ?  ?  ?Assistance Recommended at Discharge Frequent or constant Supervision/Assistance  ?Patient can return home with the following A little help with walking and/or transfers;A little help with bathing/dressing/bathroom;Direct supervision/assist for medications  management;Direct supervision/assist for financial management;Help with stairs or ramp for entrance;Assistance with cooking/housework ?  ?Equipment Recommendations ? None recommended by PT (pt already has RW)  ?  ?Recommendations for Other Services   ? ? ?  ?Precautions / Restrictions Precautions ?Precautions: Fall ?Restrictions ?Weight Bearing Restrictions: No  ?  ? ?Mobility ? Bed Mobility ?Overal bed mobility: Needs Assistance ?Bed Mobility: Supine to Sit ?  ?  ?Supine to sit: Modified independent (Device/Increase time), HOB elevated ?  ?  ?  ?  ? ?Transfers ?Overall transfer level: Needs assistance ?Equipment used: None ?Transfers: Sit to/from Stand ?Sit to Stand: Supervision ?  ?  ?  ?  ?  ?  ?  ? ?Ambulation/Gait ?Ambulation/Gait assistance: Supervision ?Gait Distance (Feet): 320 Feet ?Assistive device: Rolling walker (2 wheels) ?Gait Pattern/deviations: Decreased step length - right, Decreased step length - left, Decreased stride length ?Gait velocity: decreased ?  ?  ?General Gait Details: Pt ambulated 2 laps around nurses station with RW & supervision, 1 cuing not to enter room that isn't his, otherwise, able to recall room number & find it with extra time. Pt does ambulate in room to bathroom without AD & min assist, but reaches for/holds to furniture for extra stability. ? ? ?Stairs ?  ?  ?  ?  ?  ? ? ?Wheelchair Mobility ?  ? ?Modified Rankin (Stroke Patients Only) ?  ? ? ?  ?Balance Overall balance assessment: Needs assistance ?Sitting-balance support: No upper extremity supported, Feet supported ?Sitting balance-Leahy Scale: Good ?Sitting balance - Comments: Performs peri hygiene sitting on toilet without  assistance. ?  ?Standing balance support: During functional activity, No upper extremity supported ?Standing balance-Leahy Scale: Fair ?Standing balance comment: close supervision for standing hand hygiene at sink, CGA<>min assist for gait without AD ?  ?  ?  ?  ?  ?  ?  ?  ?Standardized Balance  Assessment ?Standardized Balance Assessment : Berg Balance Test ?Merrilee Jansky Balance Test ?Sit to Stand: Able to stand without using hands and stabilize independently ?Standing Unsupported: Able to stand 2 minutes with supervision ?Sitting with Back Unsupported but Feet Supported on Floor or Stool: Able to sit safely and securely 2 minutes ?Stand to Sit: Sits safely with minimal use of hands ?Transfers: Able to transfer safely, minor use of hands ?Standing Unsupported with Eyes Closed: Able to stand 10 seconds with supervision ?Standing Ubsupported with Feet Together: Able to place feet together independently and stand for 1 minute with supervision ?From Standing, Reach Forward with Outstretched Arm: Can reach confidently >25 cm (10") ?From Standing Position, Pick up Object from Floor: Able to pick up shoe, needs supervision ?From Standing Position, Turn to Look Behind Over each Shoulder: Looks behind from both sides and weight shifts well ?Turn 360 Degrees: Needs close supervision or verbal cueing ?Standing Unsupported, Alternately Place Feet on Step/Stool: Able to complete 4 steps without aid or supervision (completes 8 steps with supervision) ?Standing Unsupported, One Foot in Front: Able to plae foot ahead of the other independently and hold 30 seconds ?Standing on One Leg: Able to lift leg independently and hold equal to or more than 3 seconds ?Total Score: 44 ?  ?  ? ?  ?Cognition Arousal/Alertness: Awake/alert ?Behavior During Therapy: [facility] for tasks assessed/performed ?Overall Cognitive Status: No family/caregiver present to determine baseline cognitive functioning ?Area of Impairment: Orientation ?  ?  ?  ?  ?  ?  ?  ?  ?Orientation Level: Person, Place, Time ?  ?Memory: Decreased short-term memory ?Following Commands: Follows one step commands consistently ?Safety/Judgement: Decreased awareness of safety, Decreased awareness of deficits ?Awareness: Anticipatory ?  ?General Comments: Pt able to recall need to use  RW for mobility but does attempt to ambulate without it occasionally during session 2/2 poor carryover. ?  ?  ? ?  ?Exercises   ? ?  ?General Comments   ?  ?  ? ?Pertinent Vitals/Pain Pain Assessment ?Pain Assessment: No/denies pain  ? ? ?Home Living   ?  ?  ?  ?  ?  ?  ?  ?  ?  ?   ?  ?Prior Function    ?  ?  ?   ? ?PT Goals (current goals can now be found in the care plan section) Acute Rehab PT Goals ?Patient Stated Goal: get home ?PT Goal Formulation: With family ?Time For Goal Achievement: 03/06/22 ?Potential to Achieve Goals: Fair ?Progress towards PT goals: Progressing toward goals ? ?  ?Frequency ? ? ? Min 2X/week ? ? ? ?  ?PT Plan Current plan remains appropriate  ? ? ?Co-evaluation   ?  ?  ?  ?  ? ?  ?AM-PAC PT "6 Clicks" Mobility   ?Outcome Measure ? Help needed turning from your back to your side while in a flat bed without using bedrails?: None ?Help needed moving from lying on your back to sitting on the side of a flat bed without using bedrails?: None ?Help needed moving to and from a bed to a chair (including a wheelchair)?: A Little ?Help needed standing up from a  chair using your arms (e.g., wheelchair or bedside chair)?: A Little ?Help needed to walk in hospital room?: A Little ?Help needed climbing 3-5 steps with a railing? : A Little ?6 Click Score: 20 ? ?  ?End of Session   ?Activity Tolerance: Patient tolerated treatment well ?Patient left: in chair;with call bell/phone within reach (reviewed use of call bell & educated pt not to get up without assistance.) ?  ?PT Visit Diagnosis: Unsteadiness on feet (R26.81);Muscle weakness (generalized) (M62.81);Difficulty in walking, not elsewhere classified (R26.2) ?  ? ? ?Time: 1518-3437 ?PT Time Calculation (min) (ACUTE ONLY): 23 min ? ?Charges:  $Therapeutic Activity: 23-37 mins          ?          ? ?Lavone Nian, PT, DPT ?02/28/22, 3:27 PM ? ? ? ?Waunita Schooner ?02/28/2022, 3:25 PM ? ?

## 2022-02-28 NOTE — Progress Notes (Signed)
DISCHARGE NOTE: ? ?Pt given discharge instructions. Pt verbalized understanding understanding. Pt waiting on wife for transportation.  ?

## 2022-02-28 NOTE — Progress Notes (Signed)
DISCHARGE NOTE: ? ?Pt given discharge instructions, bilateral hearing aids on, all belongings sent with pt. Pt verbalized understanding. Pt wheeled to car by staff. Wife providing transportation.  ?

## 2022-02-28 NOTE — TOC Progression Note (Signed)
Transition of Care (TOC) - Progression Note  ? ? ?Patient Details  ?Name: Brett Blake ?MRN: [id number] ?Date of Birth: [birth year] ? ?Transition of Care (TOC) CM/SW Contact  ?Conception Oms, RN ?Phone Number: ?02/28/2022, 9:27 AM ? ?Clinical Narrative:   Checked Ins approval status, It is pending Ref number [id number] ? ? ? ?Expected Discharge Plan: Home/Self Care ?Barriers to Discharge: Continued Medical Work up ? ?Expected Discharge Plan and Services ?Expected Discharge Plan: Home/Self Care ?  ?  ?Post Acute Care Choice: NA ?Living arrangements for the past 2 months: [place] ?                ?  ?  ?  ?  ?  ?  ?  ?  ?  ?  ? ? ?Social Determinants of Health (SDOH) Interventions ?  ? ?Readmission Risk Interventions ?No flowsheet data found. ? ?

## 2022-02-28 NOTE — TOC Progression Note (Signed)
Transition of Care (TOC) - Progression Note  ? ? ?Patient Details  ?Name: Brett Blake ?MRN: [id number] ?Date of Birth: 04-Oct-1941 ? ?Transition of Care (TOC) CM/SW Contact  ?Conception Oms, RN ?Phone Number: ?02/28/2022, 12:13 PM ? ?Clinical Narrative:    ?The patient is walking over [place] will not approve, expect the patient to go home today ? ? ?Expected Discharge Plan: Home/Self Care ?Barriers to Discharge: Continued Medical Work up ? ?Expected Discharge Plan and Services ?Expected Discharge Plan: Home/Self Care ?  ?  ?Post Acute Care Choice: NA ?Living arrangements for the past 2 months: [place] ?                ?  ?  ?  ?  ?  ?  ?  ?  ?  ?  ? ? ?Social Determinants of Health (SDOH) Interventions ?  ? ?Readmission Risk Interventions ?No flowsheet data found. ? ?

## 2022-06-11 ENCOUNTER — Emergency Department
Admission: EM | Admit: 2022-06-11 | Discharge: 2022-06-11 | Disposition: A | Discharge status: Home / Self Care | Payer: Medicare Other | Attending: Emergency Medicine | Admitting: Emergency Medicine

## 2022-06-11 ENCOUNTER — Emergency Department: Payer: Medicare Other

## 2022-06-11 DIAGNOSIS — J449 Chronic obstructive pulmonary disease, unspecified: Secondary | ICD-10-CM | POA: Diagnosis not present

## 2022-06-11 DIAGNOSIS — I10 Essential (primary) hypertension: Secondary | ICD-10-CM | POA: Diagnosis not present

## 2022-06-11 DIAGNOSIS — R197 Diarrhea, unspecified: Secondary | ICD-10-CM | POA: Insufficient documentation

## 2022-06-11 DIAGNOSIS — R10819 Abdominal tenderness, unspecified site: Secondary | ICD-10-CM | POA: Diagnosis not present

## 2022-06-11 DIAGNOSIS — A0811 Acute gastroenteropathy due to Norwalk agent: Secondary | ICD-10-CM

## 2022-06-11 DIAGNOSIS — R112 Nausea with vomiting, unspecified: Secondary | ICD-10-CM | POA: Insufficient documentation

## 2022-06-11 DIAGNOSIS — R509 Fever, unspecified: Secondary | ICD-10-CM | POA: Insufficient documentation

## 2022-06-11 LAB — GASTROINTESTINAL PANEL BY PCR, STOOL (REPLACES STOOL CULTURE)

## 2022-06-11 LAB — URINALYSIS, ROUTINE W REFLEX MICROSCOPIC
Bilirubin Urine: NEGATIVE
Glucose, UA: NEGATIVE mg/dL
Hgb urine dipstick: NEGATIVE
Ketones, ur: NEGATIVE mg/dL
Leukocytes,Ua: NEGATIVE
Nitrite: NEGATIVE
Protein, ur: NEGATIVE mg/dL
Specific Gravity, Urine: 1.036 — ABNORMAL HIGH (ref 1.005–1.030)
pH: 6 (ref 5.0–8.0)

## 2022-06-11 LAB — COMPREHENSIVE METABOLIC PANEL
ALT: 16 U/L (ref 0–44)
AST: 25 U/L (ref 15–41)
Albumin: 3.8 g/dL (ref 3.5–5.0)
Alkaline Phosphatase: 89 U/L (ref 38–126)
Anion gap: 12 (ref 5–15)
BUN: 14 mg/dL (ref 8–23)
CO2: 25 mmol/L (ref 22–32)
Calcium: 9.1 mg/dL (ref 8.9–10.3)
Chloride: 102 mmol/L (ref 98–111)
Creatinine, Ser: 0.87 mg/dL (ref 0.61–1.24)
GFR, Estimated: >60 mL/min (ref >60)
Glucose, Bld: 124 mg/dL — ABNORMAL HIGH (ref 70–99)
Potassium: 3.2 mmol/L — ABNORMAL LOW (ref 3.5–5.1)
Sodium: 139 mmol/L (ref 135–145)
Total Bilirubin: 0.9 mg/dL (ref 0.3–1.2)
Total Protein: 7.5 g/dL (ref 6.5–8.1)

## 2022-06-11 LAB — CBC
HCT: 43.6 % (ref 39.0–52.0)
Hemoglobin: 14.6 g/dL (ref 13.0–17.0)
MCH: 29.7 pg (ref 26.0–34.0)
MCHC: 33.5 g/dL (ref 30.0–36.0)
MCV: 88.8 fL (ref 80.0–100.0)
Platelets: 205 10*3/uL (ref 150–400)
RBC: 4.91 MIL/uL (ref 4.22–5.81)
RDW: 14.2 % (ref 11.5–15.5)
WBC: 5.4 10*3/uL (ref 4.0–10.5)
nRBC: 0 % (ref 0.0–0.2)

## 2022-06-11 LAB — C DIFFICILE QUICK SCREEN W PCR REFLEX
C Diff antigen: POSITIVE — AB
C Diff toxin: NEGATIVE

## 2022-06-11 LAB — LIPASE, BLOOD: Lipase: 22 U/L (ref 11–51)

## 2022-06-11 LAB — CLOSTRIDIUM DIFFICILE BY PCR, REFLEXED: Toxigenic C. Difficile by PCR: POSITIVE — AB

## 2022-06-11 MED ORDER — LACTATED RINGERS IV BOLUS
1000.0000 mL | Freq: Once | INTRAVENOUS | Status: AC
  Administered 2022-06-11: 1000 mL via INTRAVENOUS

## 2022-06-11 MED ORDER — IOHEXOL 300 MG/ML  SOLN
100.0000 mL | Freq: Once | INTRAMUSCULAR | Status: AC | PRN
  Administered 2022-06-11: 100 mL via INTRAVENOUS

## 2022-06-11 MED ORDER — LORAZEPAM 2 MG/ML IJ SOLN
0.5000 mg | Freq: Once | INTRAMUSCULAR | Status: AC
  Administered 2022-06-11: 0.5 mg via INTRAVENOUS
  Filled 2022-06-11: qty 1

## 2022-06-11 MED ORDER — ONDANSETRON HCL 4 MG/2ML IJ SOLN
INTRAMUSCULAR | Status: AC
  Filled 2022-06-11: qty 2

## 2022-06-11 MED ORDER — ONDANSETRON HCL 4 MG/2ML IJ SOLN
4.0000 mg | Freq: Once | INTRAMUSCULAR | Status: AC
  Administered 2022-06-11: 4 mg via INTRAVENOUS

## 2022-06-11 MED ORDER — POTASSIUM CHLORIDE 20 MEQ PO PACK
40.0000 meq | PACK | Freq: Once | ORAL | Status: AC
  Administered 2022-06-11: 40 meq via ORAL
  Filled 2022-06-11: qty 2

## 2022-06-11 MED ORDER — METOCLOPRAMIDE HCL 5 MG/ML IJ SOLN
10.0000 mg | Freq: Once | INTRAMUSCULAR | Status: AC
  Administered 2022-06-11: 10 mg via INTRAVENOUS
  Filled 2022-06-11: qty 2

## 2022-06-11 MED ORDER — ONDANSETRON 4 MG PO TBDP: 4.0000 mg | ORAL_TABLET | Freq: Once | ORAL | Status: DC | PRN

## 2022-06-11 MED ORDER — ONDANSETRON 4 MG PO TBDP: 4.0000 mg | ORAL_TABLET | Freq: Three times a day (TID) | ORAL | 0 refills | Status: AC | PRN

## 2022-06-11 NOTE — ED Provider Notes (Signed)
Procedures  Clinical Course as of 06/11/22 1840  Sat Jun 11, 2022  1257 Reassessed. Feeling better. Tachycardia resolved. He's asking if he can go home. We discuss pending stool sample and need to assess for c diff. He's asking for something to eat/drink [DS]  1320 Reassessed.  Reports postprandial emesis.  We discussed further antiemetics and fluids.  Reinforced the importance of GI panel [DS]  1404 Reassessed.  Reports feeling quite a bit better after the Ativan.  Thinks he can pass a bowel movement. [DS]  0712 Reassessed. Wife at the bedside. Pt reports feeling better. Able to give a very small amount of soft stool.  No diarrhea since has been here for 5hrs [DS]    Clinical Course User Index [DS] Vladimir Crofts, MD    ----------------------------------------- 6:40 PM on 06/11/2022 ----------------------------------------- GI panel positive for norovirus.  CT negative for signs of colitis, vitals and labs reassuring.  Doubt active/refractory C. difficile colitis.  Will treat supportively for norovirus.     Carrie Mew, MD 06/11/22 1840

## 2022-06-11 NOTE — ED Triage Notes (Signed)
Patient to ER via [facility] with complaints of nausea, vomiting, and diarrhea. Reports symptoms started yesterday. Patient reports being unable to keep anything down, has been unable to take prescribed medications. Patient also reports vomiting the tylenol he has been taking. Patient reports he thinks he had a fever, but did not check.

## 2022-06-11 NOTE — ED Notes (Signed)
Pt stating increased nausea and dry heaving at this time

## 2022-06-11 NOTE — ED Provider Notes (Signed)
[facility] Provider Note    Event Date/Time   First MD Initiated Contact with Patient 06/11/22 1047     (approximate)   History   Emesis   HPI  Brett Blake is a 81 y.o. male who presents to the ED for evaluation of Emesis   I review DC summary from 3/6. Hx COPD, alcohol abuse, GERD and HTN. He was admitted with EtOH withdrawals and DT then.  I reviewed 4/25 PCP visit for diarrhea.  Diagnosed with C. difficile with positive toxin detected on GI profile.  Started on oral vancomycin for 10 days. I reviewed PCP telephone encounter from 5/30 where this dose was repeated, vancomycin for 10 more days because he was still having symptoms.  Patient presents to the ED for the evaluation of continued inability to keep anything down.  He reports primarily nausea and emesis with recurrent dry heaving and inability to keep anything down, but has also had "a few episodes" of diarrhea.  He estimates 3-4 episodes last night.  Reports concurrent fevers and chills.  Denies any abdominal pain.  Denies chest pain, syncopal episodes.  Does report subacute dysuria. Reports compliance with vancomycin dosing and finished his last oral Vanco dose last week.  Reports it feels like it never quite went away all the way.  Reports drinking less these days, but still drinking every day.  Reports 1-2 drinks every night to help him sleep.  Physical Exam   Triage Vital Signs: ED Triage Vitals [06/11/22 0953]  Enc Vitals Group     BP (!) 116/57     Pulse Rate (!) 104     Resp 17     Temp 98.6 F (37 C)     Temp Source Oral     SpO2 98 %     Weight      Height '5\' 4"'$  (1.626 m)     Head Circumference      Peak Flow      Pain Score 0     Pain Loc      Pain Edu?      Excl. in [place]?     Most recent vital signs: Vitals:   06/11/22 1300 06/11/22 1330  BP: (!) 178/102 (!) 171/92  Pulse: (!) 104 89  Resp: 16 16  Temp:    SpO2: 98% 100%    General: Awake, no distress.   CV:  Good peripheral perfusion.  Resp:  Normal effort.  Abd:  No distention.  Poorly localizing tenderness throughout the lower abdomen without peritoneal features.  Upper abdomen is benign. MSK:  No deformity noted.  Neuro:  No focal deficits appreciated. Other:     ED Results / Procedures / Treatments   Labs (all labs ordered are listed, but only abnormal results are displayed) Labs Reviewed  COMPREHENSIVE METABOLIC PANEL - Abnormal; Notable for the following components:      Result Value   Potassium 3.2 (*)    Glucose, Bld 124 (*)    All other components within normal limits  URINALYSIS, ROUTINE W REFLEX MICROSCOPIC - Abnormal; Notable for the following components:   Color, Urine YELLOW (*)    APPearance CLEAR (*)    Specific Gravity, Urine 1.036 (*)    All other components within normal limits  C DIFFICILE QUICK SCREEN W PCR REFLEX    GASTROINTESTINAL PANEL BY PCR, STOOL (REPLACES STOOL CULTURE)  LIPASE, BLOOD  CBC    EKG Sinus rhythm, rate of 100 bpm.  Tremulous  baseline.  Normal axis and intervals.  No clear signs of acute ischemia.  RADIOLOGY CT abdomen/pelvis interpreted by me without evidence of acute intra-abdominal pathology.  Official radiology report(s): CT ABDOMEN PELVIS W CONTRAST  Result Date: 06/11/2022 CLINICAL DATA:  81 year old male with abdominal and pelvic pain with nausea, vomiting and diarrhea. Recent diagnosis of C difficile. EXAM: CT ABDOMEN AND PELVIS WITH CONTRAST TECHNIQUE: Multidetector CT imaging of the abdomen and pelvis was performed using the standard protocol following bolus administration of intravenous contrast. RADIATION DOSE REDUCTION: This exam was performed according to the departmental dose-optimization program which includes automated exposure control, adjustment of the mA and/or kV according to patient size and/or use of iterative reconstruction technique. CONTRAST:  127m OMNIPAQUE IOHEXOL 300 MG/ML  SOLN COMPARISON:  05/20/2021 CT  FINDINGS: Lower chest: No acute abnormality. A stable calcified LEFT LOWER lobe granuloma again noted. Hepatobiliary: No new or significant hepatic abnormalities identified. The patient is status post cholecystectomy. There is no evidence of intrahepatic or extrahepatic biliary dilatation. Pancreas: Unremarkable Spleen: No significant abnormalities. Adrenals/Urinary Tract: The kidneys, adrenal glands and bladder are unremarkable except for unchanged bilateral renal cortical thinning, mild chronic bladder wall thickening and probable small LEFT renal cyst which needs no follow-up. Stomach/Bowel: Stomach is within normal limits. Appendix appears normal. No evidence of bowel wall thickening, distention, or inflammatory changes. Colonic diverticulosis identified without evidence of acute diverticulitis. Vascular/Lymphatic: Aortic atherosclerosis. No enlarged abdominal or pelvic lymph nodes. Reproductive: Prostate enlargement again noted. Other: No ascites, focal collection or pneumoperitoneum. Musculoskeletal: No acute or suspicious bony abnormalities are noted. IMPRESSION: 1. No evidence of acute abnormality. 2. Colonic diverticulosis without evidence of acute diverticulitis. 3. Prostate enlargement and chronic bladder wall thickening. 4. Aortic Atherosclerosis (ICD10-I70.0). Electronically Signed   By: JMargarette CanadaM.D.   On: 06/11/2022 12:00    PROCEDURES and INTERVENTIONS:  .1-3 Lead EKG Interpretation  Performed by: SVladimir Crofts MD Authorized by: SVladimir Crofts MD     Interpretation: normal     ECG rate:  94   ECG rate assessment: normal     Rhythm: sinus rhythm     Ectopy: none     Conduction: normal     Medications  LORazepam (ATIVAN) injection 0.5 mg (has no administration in time range)  ondansetron (ZOFRAN) injection 4 mg ( Intravenous Not Given 06/11/22 1109)  lactated ringers bolus 1,000 mL (0 mLs Intravenous Stopped 06/11/22 1300)  metoCLOPramide (REGLAN) injection 10 mg (10 mg Intravenous  Given 06/11/22 1109)  iohexol (OMNIPAQUE) 300 MG/ML solution 100 mL (100 mLs Intravenous Contrast Given 06/11/22 1136)  lactated ringers bolus 1,000 mL (1,000 mLs Intravenous New Bag/Given 06/11/22 1317)  LORazepam (ATIVAN) injection 0.5 mg (0.5 mg Intravenous Given 06/11/22 1316)     IMPRESSION / MDM / ASSESSMENT AND PLAN / ED COURSE  I reviewed the triage vital signs and the nursing notes.  Differential diagnosis includes, but is not limited to, alcohol withdrawals, cholecystitis, diverticulitis, pancreatitis, dehydration, C. difficile, gastroenteritis  {Patient presents with symptoms of an acute illness or injury that is potentially life-threatening.  81year old male with long drinking history presents to the ED with recurrent emesis and a couple episodes of diarrhea.  He look systemically well and has a benign abdominal examination.  Blood work is benign.  Minimal hypokalemia is repleted orally.  Lipase is normal and the CBC is normal.  Urine without infectious features.  CT without evidence of toxic megacolon or other intra-abdominal pathology.  Awaiting C. difficile studies considering his recent treatment  for this and concerned that it may not of been resolved.  He is feeling much better after a small dose of IV Ativan and some IV fluids.  Anticipated degree of alcohol withdrawals are at play as well in the setting of his regular drinking and inability to keep down his ethanol last night. He is signed out to oncoming provider to follow-up on this C. difficile panel and reassessment after p.o. challenge  Clinical Course as of 06/11/22 1449  Sat Jun 11, 2022  1257 Reassessed. Feeling better. Tachycardia resolved. He's asking if he can go home. We discuss pending stool sample and need to assess for c diff. He's asking for something to eat/drink [DS]  1320 Reassessed.  Reports postprandial emesis.  We discussed further antiemetics and fluids.  Reinforced the importance of GI panel [DS]  1404  Reassessed.  Reports feeling quite a bit better after the Ativan.  Thinks he can pass a bowel movement. [DS]  0388 Reassessed. Wife at the bedside. Pt reports feeling better. Able to give a very small amount of soft stool.  No diarrhea since has been here for 5hrs [DS]    Clinical Course User Index [DS] Vladimir Crofts, MD     FINAL CLINICAL IMPRESSION(S) / ED DIAGNOSES   Final diagnoses:  None     Rx / DC Orders   ED Discharge Orders     None        Note:  This document was prepared using Dragon voice recognition software and may include unintentional dictation errors.   Vladimir Crofts, MD 06/11/22 1450

## 2022-06-11 NOTE — ED Notes (Signed)
Pt given crackers/applesauce and water for PO challenge

## 2022-06-11 NOTE — ED Notes (Signed)
Dc ppw provided. Follow up and rx information given. Pt provides consent for dc and alert and oriented to lobby on foot

## 2022-08-19 IMAGING — CT CT ABD-PELV W/ CM
2 of 5 series · 15 of 46 positions shown, 17 images · IV contrast (APPLIED)
Comparison: 05/20/2021 CT

CLINICAL DATA: 80-year-old male with abdominal and pelvic pain with
nausea, vomiting and diarrhea. Recent diagnosis of C difficile.

EXAM:
CT ABDOMEN AND PELVIS WITH CONTRAST
TECHNIQUE: Multidetector CT imaging of the abdomen and pelvis was performed
using the standard protocol following bolus administration of
intravenous contrast.

[Series 2: abdomen 5.0 · axial · 0.88mm/px · z∈[+1288,+1668]mm · 12 of 90 slices shown, 14 images]
[im 7/90  soft-tissue]
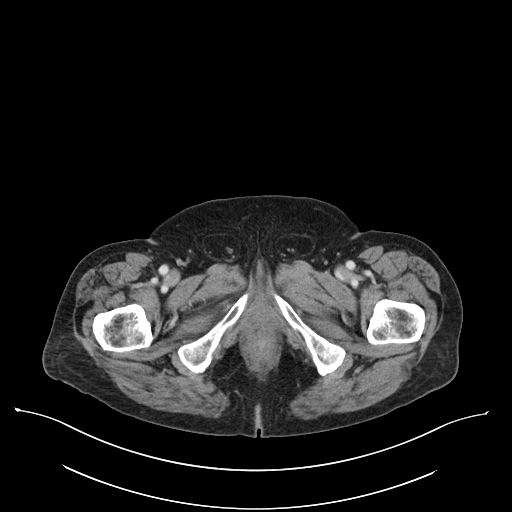
[im 7/90  bone]
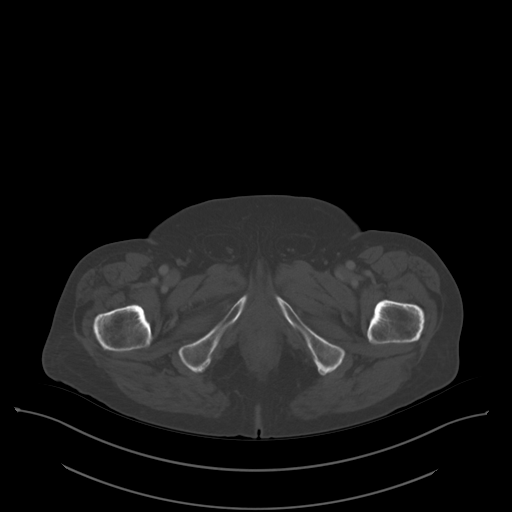
[im 14/90  soft-tissue]
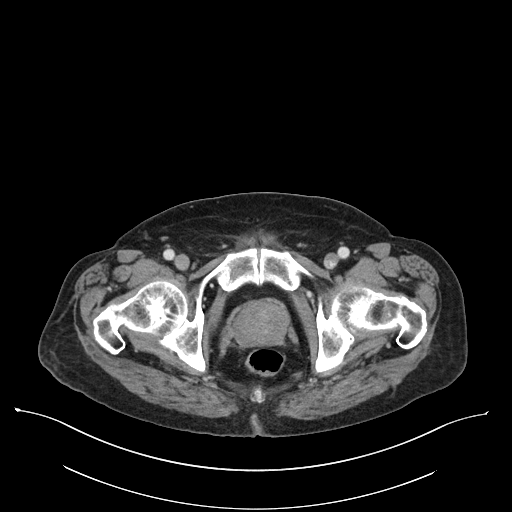
[im 21/90  soft-tissue]
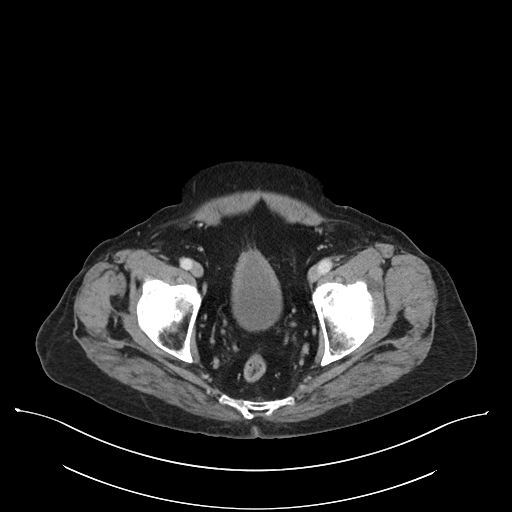
[im 28/90  soft-tissue]
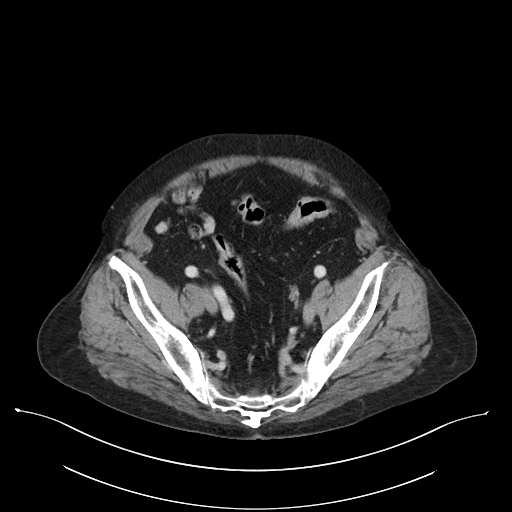
[im 35/90  soft-tissue]
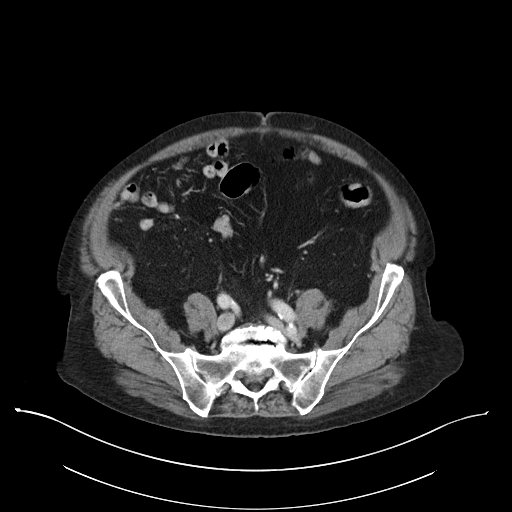
[im 42/90  soft-tissue]
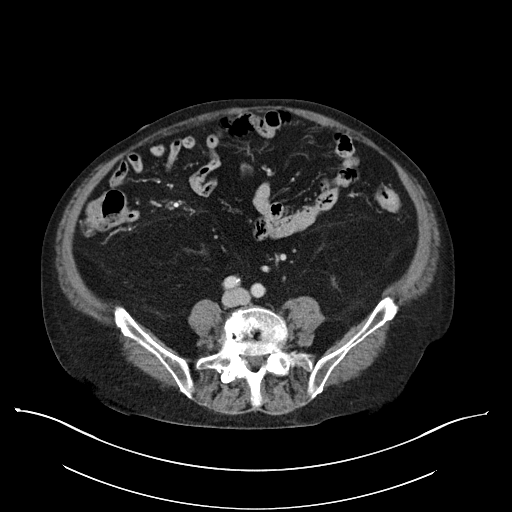
[im 48/90  soft-tissue]
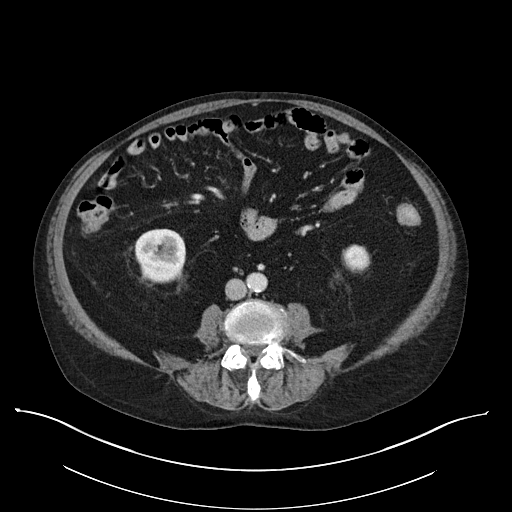
[im 55/90  soft-tissue]
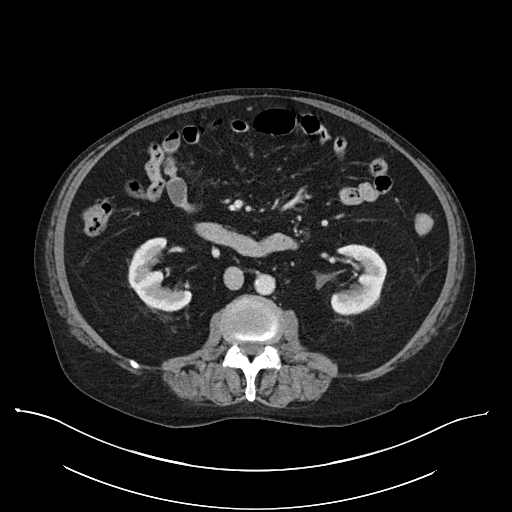
[im 62/90  soft-tissue]
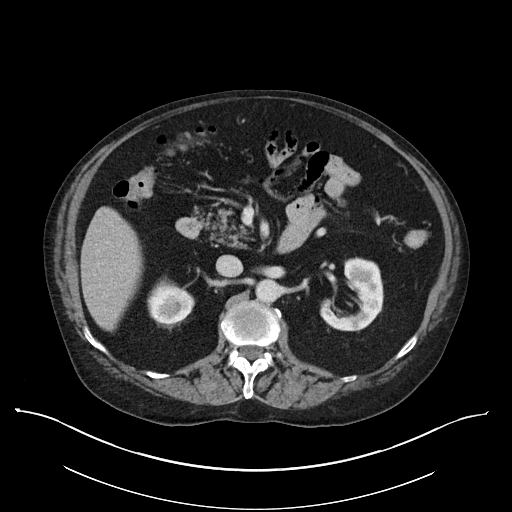
[im 62/90  bone]
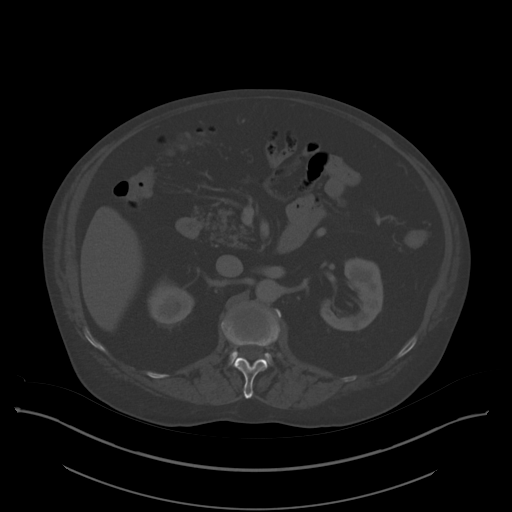
[im 69/90  soft-tissue]
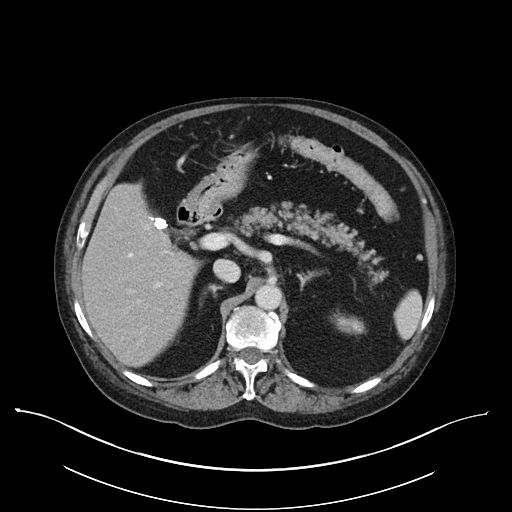
[im 76/90  soft-tissue]
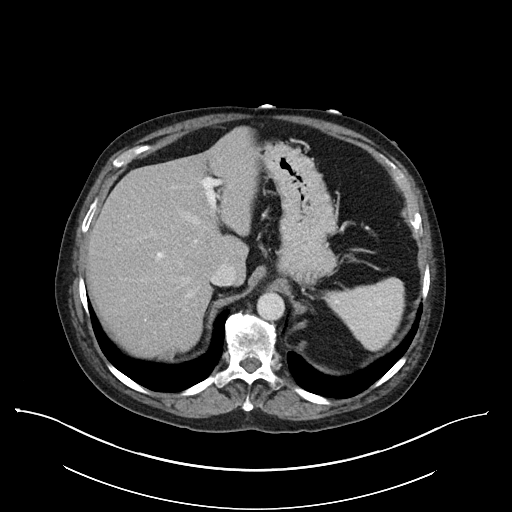
[im 83/90  soft-tissue]
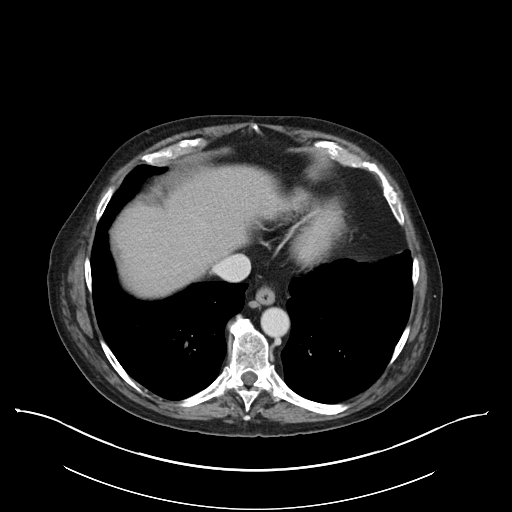

[Series 5: abdomen 3.0 mpr cor · coronal · 0.77mm/px · 3 of 109 slices shown]
[im 37/109  soft-tissue]
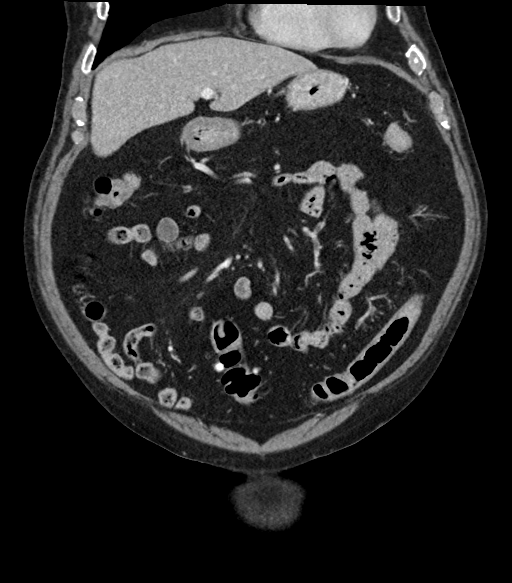
[im 49/109  soft-tissue]
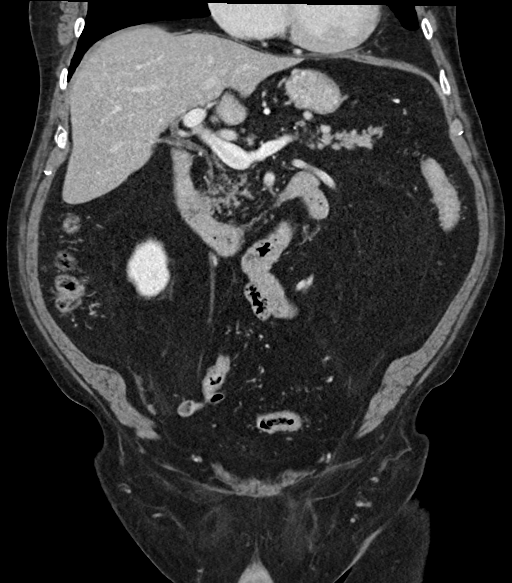
[im 61/109  soft-tissue]
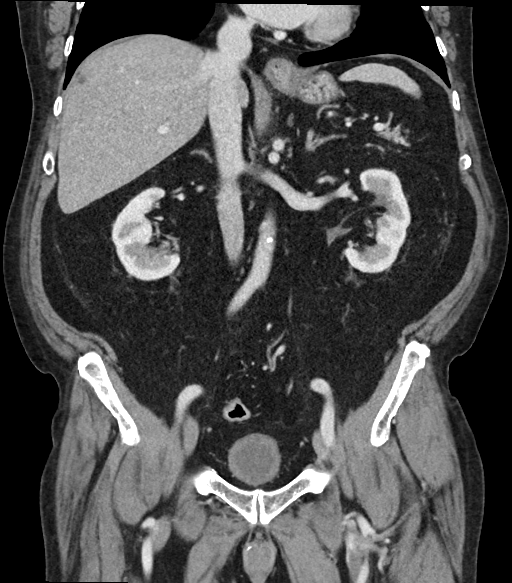

[15 of 46 positions shown; findings below may reference images not displayed]

RADIATION DOSE REDUCTION: This exam was performed according to the
departmental dose-optimization program which includes automated
exposure control, adjustment of the mA and/or kV according to
patient size and/or use of iterative reconstruction technique.

CONTRAST:  100mL OMNIPAQUE IOHEXOL 300 MG/ML  SOLN
FINDINGS: Lower chest: No acute abnormality. A stable calcified LEFT LOWER
lobe granuloma again noted.

Hepatobiliary: No new or significant hepatic abnormalities
identified. The patient is status post cholecystectomy. There is no
evidence of intrahepatic or extrahepatic biliary dilatation.

Pancreas: Unremarkable

Spleen: No significant abnormalities.

Adrenals/Urinary Tract: The kidneys, adrenal glands and bladder are
unremarkable except for unchanged bilateral renal cortical thinning,
mild chronic bladder wall thickening and probable small LEFT renal
cyst which needs no follow-up.

Stomach/Bowel: Stomach is within normal limits. Appendix appears
normal. No evidence of bowel wall thickening, distention, or
inflammatory changes. Colonic diverticulosis identified without
evidence of acute diverticulitis.

Vascular/Lymphatic: Aortic atherosclerosis. No enlarged abdominal or
pelvic lymph nodes.

Reproductive: Prostate enlargement again noted.

Other: No ascites, focal collection or pneumoperitoneum.

Musculoskeletal: No acute or suspicious bony abnormalities are
noted.
IMPRESSION: 1. No evidence of acute abnormality.
2. Colonic diverticulosis without evidence of acute diverticulitis.
3. Prostate enlargement and chronic bladder wall thickening.
4. Aortic Atherosclerosis (TP6G3-M1G.G).

## 2022-10-24 ENCOUNTER — Emergency Department
Admission: EM | Admit: 2022-10-24 | Discharge: 2022-10-24 | Disposition: A | Discharge status: Home / Self Care | Payer: Medicare Other | Attending: Emergency Medicine | Admitting: Emergency Medicine

## 2022-10-24 ENCOUNTER — Other Ambulatory Visit: Payer: Self-pay

## 2022-10-24 ENCOUNTER — Encounter: Payer: Self-pay | Admitting: Emergency Medicine

## 2022-10-24 ENCOUNTER — Emergency Department: Payer: Medicare Other

## 2022-10-24 DIAGNOSIS — J449 Chronic obstructive pulmonary disease, unspecified: Secondary | ICD-10-CM | POA: Insufficient documentation

## 2022-10-24 DIAGNOSIS — E119 Type 2 diabetes mellitus without complications: Secondary | ICD-10-CM | POA: Insufficient documentation

## 2022-10-24 DIAGNOSIS — W19XXXA Unspecified fall, initial encounter: Secondary | ICD-10-CM | POA: Diagnosis not present

## 2022-10-24 DIAGNOSIS — S32020A Wedge compression fracture of second lumbar vertebra, initial encounter for closed fracture: Secondary | ICD-10-CM | POA: Insufficient documentation

## 2022-10-24 DIAGNOSIS — R296 Repeated falls: Secondary | ICD-10-CM | POA: Diagnosis not present

## 2022-10-24 DIAGNOSIS — I10 Essential (primary) hypertension: Secondary | ICD-10-CM | POA: Diagnosis not present

## 2022-10-24 DIAGNOSIS — S3992XA Unspecified injury of lower back, initial encounter: Secondary | ICD-10-CM | POA: Diagnosis present

## 2022-10-24 LAB — CBC
HCT: 39.2 % (ref 39.0–52.0)
Hemoglobin: 13.3 g/dL (ref 13.0–17.0)
MCH: 33.2 pg (ref 26.0–34.0)
MCHC: 33.9 g/dL (ref 30.0–36.0)
MCV: 97.8 fL (ref 80.0–100.0)
Platelets: 172 K/uL (ref 150–400)
RBC: 4.01 MIL/uL — ABNORMAL LOW (ref 4.22–5.81)
RDW: 13.1 % (ref 11.5–15.5)
WBC: 5.6 K/uL (ref 4.0–10.5)
nRBC: 0 % (ref 0.0–0.2)

## 2022-10-24 LAB — COMPREHENSIVE METABOLIC PANEL
ALT: 51 U/L — ABNORMAL HIGH (ref 0–44)
AST: 43 U/L — ABNORMAL HIGH (ref 15–41)
Albumin: 3.1 g/dL — ABNORMAL LOW (ref 3.5–5.0)
Alkaline Phosphatase: 66 U/L (ref 38–126)
Anion gap: 17 — ABNORMAL HIGH (ref 5–15)
BUN: 38 mg/dL — ABNORMAL HIGH (ref 8–23)
CO2: 22 mmol/L (ref 22–32)
Calcium: 8.4 mg/dL — ABNORMAL LOW (ref 8.9–10.3)
Chloride: 97 mmol/L — ABNORMAL LOW (ref 98–111)
Creatinine, Ser: 1.08 mg/dL (ref 0.61–1.24)
GFR, Estimated: >60 mL/min (ref >60)
Glucose, Bld: 115 mg/dL — ABNORMAL HIGH (ref 70–99)
Potassium: 4.3 mmol/L (ref 3.5–5.1)
Sodium: 136 mmol/L (ref 135–145)
Total Bilirubin: 1 mg/dL (ref 0.3–1.2)
Total Protein: 6.1 g/dL — ABNORMAL LOW (ref 6.5–8.1)

## 2022-10-24 LAB — CK: Total CK: 55 U/L (ref 49–397)

## 2022-10-24 MED ORDER — MORPHINE SULFATE (PF) 4 MG/ML IV SOLN
4.0000 mg | Freq: Once | INTRAVENOUS | Status: AC
  Administered 2022-10-24: 4 mg via INTRAVENOUS
  Filled 2022-10-24: qty 1

## 2022-10-24 MED ORDER — SODIUM CHLORIDE 0.9 % IV BOLUS
1000.0000 mL | Freq: Once | INTRAVENOUS | Status: AC
  Administered 2022-10-24: 1000 mL via INTRAVENOUS

## 2022-10-24 MED ORDER — ONDANSETRON HCL 4 MG/2ML IJ SOLN
4.0000 mg | Freq: Once | INTRAMUSCULAR | Status: AC
  Administered 2022-10-24: 4 mg via INTRAVENOUS
  Filled 2022-10-24: qty 2

## 2022-10-24 NOTE — ED Triage Notes (Signed)
Presents via EMS from home    S/P fall  States he hurt his back about 2-3 weeks ago  But has has freq fall over the past  few days  Cont's to have pain  but fell yesterday and today   Skin tears noted to right elbow

## 2022-10-24 NOTE — ED Provider Notes (Signed)
[facility] Provider Note    Event Date/Time   First MD Initiated Contact with Patient 10/24/22 [phone]     (approximate)  History   Chief Complaint: Fall  HPI  Brett Blake is a 81 y.o. male with a past medical history of anxiety, COPD, diabetes, hypertension, hyperlipidemia, presents to the emergency department for a fall.  According to the patient 3 weeks ago his dog died, he had to pick the dog up and take it to the vet.  He states his dog weighs approximately 90 pounds.  Since that time he has been experiencing significant pain in his lower back that he believes is led to other falls.  He states over the past 2 weeks or so he has been feeling off balance at times leading him to fall.  He states he fell this morning trying to go to the bathroom.  Does not believe he hit his head.  No anticoagulation.  Patient denies any focal weakness or numbness in either arm or leg.  Denies any incontinence.  Denies any sensory deficits in his legs.  Physical Exam   Triage Vital Signs: ED Triage Vitals  Enc Vitals Group     BP      Pulse      Resp      Temp      Temp src      SpO2      Weight      Height      Head Circumference      Peak Flow      Pain Score      Pain Loc      Pain Edu?      Excl. in [place]?     Most recent vital signs: There were no vitals filed for this visit.  General: Awake, no distress.  CV:  Good peripheral perfusion.  Regular rate and rhythm  Resp:  Normal effort.  Equal breath sounds bilaterally.  Abd:  No distention.  Soft, nontender.  No rebound or guarding. Other:  4+/5 strength in bilateral lower extremities however he does state pain in the lower back upon raising each leg.  No traumatic findings on exam.  Nontender hips.  Good range of motion in upper extremities.  No C-spine tenderness.   ED Results / Procedures / Treatments   EKG EKG viewed and interpreted by myself shows sinus tachycardia at 108 bpm with a narrow QRS,  normal axis, normal intervals, nonspecific ST changes without ST elevation.  RADIOLOGY  I have reviewed and interpreted the CT head images no intracranial bleed seen on my evaluation. Radiology is read the CT scan of the head and C-spine is negative. CT lumbar spine shows a subtle L2 compression fracture    MEDICATIONS ORDERED IN ED: Medications  sodium chloride 0.9 % bolus 1,000 mL (has no administration in time range)  morphine (PF) 4 MG/ML injection 4 mg (has no administration in time range)  ondansetron (ZOFRAN) injection 4 mg (has no administration in time range)     IMPRESSION / MDM / ASSESSMENT AND PLAN / ED COURSE  I reviewed the triage vital signs and the nursing notes.  Patient's presentation is most consistent with acute presentation with potential threat to life or bodily function.  Patient presents emergency department for lower back pain and a fall.  Patient states he has been experiencing lower back pain for the past 3 weeks since picking up his dog.  Denies any incontinence denies any  numbness.  Patient states he has had multiple falls over the past 3 weeks which she relates more to his back pain causing him to fall.  Again denies any sensory changes to the legs.  We will check labs including a CK level given multiple recent falls.  We will obtain CT imaging the head and C-spine as a precaution.  We will also obtain CT imaging of the lumbar spine where the patient has pain.  No symptoms concerning for cauda equina.  Patient's work-up shows a reassuring CBC with a normal white blood cell count.  Chemistry shows mild dehydration with an anion gap of 17 but otherwise no significant findings.  CK is normal at 55.  Patient receiving IV fluids.  Patient has received some pain medication IV fluids.  States he is feeling much better.  CT scan is resulted showing a very subtle/mild compression fracture of L2 likely the cause of the patient's back pain.  I discussed with the patient  following up with orthopedics we will refer to Dr. Rudene Christians.  Patient's work-up otherwise shows no significant findings.  We will discharge back home with orthopedic follow-up.  Patient agreeable to plan of care.  Provided my typical back pain return precautions.  FINAL CLINICAL IMPRESSION(S) / ED DIAGNOSES   Low back pain Fall L2 compression fracture    Note:  This document was prepared using Dragon voice recognition software and may include unintentional dictation errors.   Harvest Dark, MD 10/24/22 1030

## 2022-10-24 NOTE — Discharge Instructions (Addendum)
Please call the number to follow-up with orthopedics regarding your L2 spine compression fracture.  Please use Tylenol for pain control as written on the box.  Return to the emergency department for any worsening pain, any weakness or numbness of either leg or if you have any bladder/bowel incontinence.

## 2022-10-28 ENCOUNTER — Other Ambulatory Visit: Payer: Self-pay | Admitting: Internal Medicine

## 2022-10-28 DIAGNOSIS — S32020G Wedge compression fracture of second lumbar vertebra, subsequent encounter for fracture with delayed healing: Secondary | ICD-10-CM

## 2022-10-30 ENCOUNTER — Emergency Department
Admission: EM | Admit: 2022-10-30 | Discharge: 2022-10-30 | Disposition: A | Discharge status: Home / Self Care | Payer: Medicare Other | Attending: Emergency Medicine | Admitting: Emergency Medicine

## 2022-10-30 ENCOUNTER — Other Ambulatory Visit: Payer: Self-pay

## 2022-10-30 ENCOUNTER — Emergency Department: Payer: Medicare Other

## 2022-10-30 DIAGNOSIS — F10921 Alcohol use, unspecified with intoxication delirium: Secondary | ICD-10-CM | POA: Diagnosis not present

## 2022-10-30 DIAGNOSIS — W19XXXA Unspecified fall, initial encounter: Secondary | ICD-10-CM | POA: Insufficient documentation

## 2022-10-30 DIAGNOSIS — M545 Low back pain, unspecified: Secondary | ICD-10-CM

## 2022-10-30 DIAGNOSIS — S32020A Wedge compression fracture of second lumbar vertebra, initial encounter for closed fracture: Secondary | ICD-10-CM | POA: Insufficient documentation

## 2022-10-30 DIAGNOSIS — S32020D Wedge compression fracture of second lumbar vertebra, subsequent encounter for fracture with routine healing: Secondary | ICD-10-CM

## 2022-10-30 MED ORDER — IBUPROFEN 800 MG PO TABS
ORAL_TABLET | ORAL | Status: AC
  Filled 2022-10-30: qty 1

## 2022-10-30 NOTE — ED Provider Notes (Signed)
[facility] Provider Note   Event Date/Time   First MD Initiated Contact with Patient 10/30/22 0031     (approximate) History  Fall and Alcohol Intoxication  HPI Brett Blake is a 81 y.o. male with a past medical history of alcohol abuse and recently diagnosed with an acute versus subacute L2 compression fracture who presents via EMS after a mechanical fall from standing that was witnessed by his wife.  Patient has been drinking heavily this evening and presents slightly confused as to why he was brought here.  Patient only complaining of lumbar back pain at this time.  Further history and review of systems are unable to be obtained secondary to patient's mental status   Physical Exam  Triage Vital Signs: ED Triage Vitals [10/30/22 0024]  Enc Vitals Group     BP (!) 172/95     Pulse Rate 91     Resp 18     Temp 97.9 F (36.6 C)     Temp Source Oral     SpO2 97 %     Weight      Height      Head Circumference      Peak Flow      Pain Score      Pain Loc      Pain Edu?      Excl. in [place]?    Most recent vital signs: Vitals:   10/30/22 0024  BP: (!) 172/95  Pulse: 91  Resp: 18  Temp: 97.9 F (36.6 C)  SpO2: 97%   General: Awake, oriented x2.  Confused to place and situation CV:  Good peripheral perfusion.  Resp:  Normal effort.  Abd:  No distention.  Other:  Elderly overweight Caucasian male laying in bed in no acute distress.  Slurred speech ED Results / Procedures / Treatments  Labs (all labs ordered are listed, but only abnormal results are displayed) Labs Reviewed - No data to display RADIOLOGY ED MD interpretation: CT of the lumbar spine without IV contrast shows previously identified probable early subacute compression fracture.  Flygt L to without any significant change from previous imaging.  There is also subacute healing fracture of the right posterior 11th and 12th ribs without any new or acute traumatic osseous injury to the  lumbar spine  CT of the head without contrast interpreted by me shows no evidence of acute abnormalities including no intracerebral hemorrhage, obvious masses, or significant edema  CT of the cervical spine interpreted by me does not show any evidence of acute abnormalities including no acute fracture, malalignment, height loss, or dislocation -Agree with radiology assessment Official radiology report(s): CT Lumbar Spine Wo Contrast  Result Date: 10/30/2022 CLINICAL DATA:  Initial evaluation for acute trauma, ataxia. EXAM: CT LUMBAR SPINE WITHOUT CONTRAST TECHNIQUE: Multidetector CT imaging of the lumbar spine was performed without intravenous contrast administration. Multiplanar CT image reconstructions were also generated. RADIATION DOSE REDUCTION: This exam was performed according to the departmental dose-optimization program which includes automated exposure control, adjustment of the mA and/or kV according to patient size and/or use of iterative reconstruction technique. COMPARISON:  CT from 10/24/2022. FINDINGS: Segmentation: Standard. Lowest well-formed disc space labeled the L5-S1 level. Alignment: Stable line mint with trace degenerative retrolisthesis of L5 on S1, with trace anterolisthesis of L4 on L5. Findings likely chronic and facet mediated. Vertebrae: Previously identified probable early subacute compression fracture at the superior endplate of L2 again seen. Slightly increased sclerosis about the fracture since prior.  Height loss is not significantly changed or progressed without bony retropulsion. Additional probable subacute healing fractures of the right posterior eleventh and twelfth ribs noted as well. No other new or interval fracture. No discrete or worrisome osseous lesions. Paraspinal and other soft tissues: Paraspinous soft tissues demonstrate no acute finding. Aorto bi-iliac atherosclerotic disease. Disc levels: Underlying advanced chronic disc and endplate degeneration at L4-5  and L5-S1 with disc desiccation and endplate spurring. Lower lumbar facet hypertrophy, greatest at L4-5 on the right. IMPRESSION: 1. Previously identified probable early subacute compression fracture at the superior endplate of L2 again seen. Include Reese sclerosis about the fracture, but without progressive height loss or bony retropulsion. 2. Additional probable subacute healing fractures of the right posterior eleventh and twelfth ribs. 3. No other new or acute traumatic osseous injury within the lumbar spine. 4. Underlying advanced chronic disc and endplate degeneration at L4-5 and L5-S1. Aortic Atherosclerosis (ICD10-I70.0). Electronically Signed   By: Jeannine Boga M.D.   On: 10/30/2022 01:22   CT Head Wo Contrast  Result Date: 10/30/2022 CLINICAL DATA:  Fall, ETOH EXAM: CT HEAD WITHOUT CONTRAST CT CERVICAL SPINE WITHOUT CONTRAST TECHNIQUE: Multidetector CT imaging of the head and cervical spine was performed following the standard protocol without intravenous contrast. Multiplanar CT image reconstructions of the cervical spine were also generated. RADIATION DOSE REDUCTION: This exam was performed according to the departmental dose-optimization program which includes automated exposure control, adjustment of the mA and/or kV according to patient size and/or use of iterative reconstruction technique. COMPARISON:  10/24/2022 FINDINGS: CT HEAD FINDINGS Brain: No evidence of acute infarction, hemorrhage, hydrocephalus, extra-axial collection or mass lesion/mass effect. Global cortical atrophy. Subcortical white matter and periventricular small vessel ischemic changes. Left basal ganglia calcifications. Vascular: Mild intracranial atherosclerosis. Skull: Normal. Negative for fracture or focal lesion. Sinuses/Orbits: The visualized paranasal sinuses are essentially clear. The mastoid air cells are unopacified. Other: None. CT CERVICAL SPINE FINDINGS Alignment: Normal cervical lordosis. Skull base and  vertebrae: No acute fracture. No primary bone lesion or focal pathologic process. Soft tissues and spinal canal: No prevertebral fluid or swelling. No visible canal hematoma. Disc levels: Mild degenerative changes of the mid cervical spine. Spinal canal is patent. Upper chest: Visualized lung apices are clear. Other: Visualized thyroid is unremarkable. IMPRESSION: No evidence of acute intracranial abnormality. Atrophy with small vessel ischemic changes. No evidence of traumatic injury to the cervical spine. Mild degenerative changes. Electronically Signed   By: Julian Hy M.D.   On: 10/30/2022 01:19   CT Cervical Spine Wo Contrast  Result Date: 10/30/2022 CLINICAL DATA:  Fall, ETOH EXAM: CT HEAD WITHOUT CONTRAST CT CERVICAL SPINE WITHOUT CONTRAST TECHNIQUE: Multidetector CT imaging of the head and cervical spine was performed following the standard protocol without intravenous contrast. Multiplanar CT image reconstructions of the cervical spine were also generated. RADIATION DOSE REDUCTION: This exam was performed according to the departmental dose-optimization program which includes automated exposure control, adjustment of the mA and/or kV according to patient size and/or use of iterative reconstruction technique. COMPARISON:  10/24/2022 FINDINGS: CT HEAD FINDINGS Brain: No evidence of acute infarction, hemorrhage, hydrocephalus, extra-axial collection or mass lesion/mass effect. Global cortical atrophy. Subcortical white matter and periventricular small vessel ischemic changes. Left basal ganglia calcifications. Vascular: Mild intracranial atherosclerosis. Skull: Normal. Negative for fracture or focal lesion. Sinuses/Orbits: The visualized paranasal sinuses are essentially clear. The mastoid air cells are unopacified. Other: None. CT CERVICAL SPINE FINDINGS Alignment: Normal cervical lordosis. Skull base and vertebrae: No acute fracture. No  primary bone lesion or focal pathologic process. Soft tissues  and spinal canal: No prevertebral fluid or swelling. No visible canal hematoma. Disc levels: Mild degenerative changes of the mid cervical spine. Spinal canal is patent. Upper chest: Visualized lung apices are clear. Other: Visualized thyroid is unremarkable. IMPRESSION: No evidence of acute intracranial abnormality. Atrophy with small vessel ischemic changes. No evidence of traumatic injury to the cervical spine. Mild degenerative changes. Electronically Signed   By: Julian Hy M.D.   On: 10/30/2022 01:19   PROCEDURES: Critical Care performed: No Procedures MEDICATIONS ORDERED IN ED: Medications  ibuprofen (ADVIL) 800 MG tablet (has no administration in time range)   IMPRESSION / MDM / ASSESSMENT AND PLAN / ED COURSE  I reviewed the triage vital signs and the nursing notes.                             Patient's presentation is most consistent with acute presentation with potential threat to life or bodily function. Presenting after a fall that occurred just prior to arrival, resulting in injury to the lumbar spine. The mechanism of injury was a mechanical ground level fall without syncope or near-syncope. The current level of pain is moderate. There was no loss of consciousness, confusion, seizure, or memory impairment. There is not a laceration associated with the injury. Denies neck pain. The patient does not take blood thinner medications. Denies vomiting, numbness/weakness, fever  Dispo: Discharge with PCP follow-up       FINAL CLINICAL IMPRESSION(S) / ED DIAGNOSES   Final diagnoses:  Acute alcoholic intoxication with delirium ([place])  Fall, initial encounter  Lumbar back pain  Compression fracture of L2 vertebra with routine healing, subsequent encounter   Rx / DC Orders   ED Discharge Orders     None      Note:  This document was prepared using Dragon voice recognition software and may include unintentional dictation errors.   Naaman Plummer, MD 10/30/22  [phone]

## 2022-10-30 NOTE — ED Notes (Signed)
Pt taken to CT.

## 2022-10-30 NOTE — ED Notes (Signed)
Pt discharged, verbalized understanding. See paper charting

## 2022-10-30 NOTE — ED Triage Notes (Signed)
Per EMS pt has hx of frequent falls and has a lumbar fx diagnosed on 10/24/2022. Pt smells of alcohol and apparently has a known substance abuse problem/ Tonight EMS was called out for a fall and pt was found on the ground. Pt's wife lives with him and was present at home.

## 2022-10-30 NOTE — ED Notes (Signed)
Ibuprofen '800mg'$  given, see paper charting

## 2022-10-30 NOTE — ED Triage Notes (Signed)
Pt is yelling out and appears confused.He was calling out for his wife and was re-oriented that he was at the hospital. He kept asking why he was here and we explained that he was found on the floor.

## 2022-11-01 ENCOUNTER — Ambulatory Visit
Admission: RE | Admit: 2022-11-01 | Discharge: 2022-11-01 | Disposition: A | Discharge status: Home / Self Care | Payer: Medicare Other | Source: Ambulatory Visit | Attending: Internal Medicine | Admitting: Internal Medicine

## 2022-11-01 DIAGNOSIS — S32020G Wedge compression fracture of second lumbar vertebra, subsequent encounter for fracture with delayed healing: Secondary | ICD-10-CM | POA: Insufficient documentation

## 2022-11-04 ENCOUNTER — Other Ambulatory Visit: Payer: Self-pay | Admitting: Internal Medicine

## 2022-11-04 DIAGNOSIS — S32020G Wedge compression fracture of second lumbar vertebra, subsequent encounter for fracture with delayed healing: Secondary | ICD-10-CM

## 2022-11-08 ENCOUNTER — Ambulatory Visit
Admission: RE | Admit: 2022-11-08 | Discharge: 2022-11-08 | Disposition: A | Discharge status: Home / Self Care | Payer: Medicare Other | Source: Ambulatory Visit | Attending: Internal Medicine | Admitting: Internal Medicine

## 2022-11-08 ENCOUNTER — Inpatient Hospital Stay: Admission: RE | Admit: 2022-11-08 | Payer: Medicare Other | Source: Ambulatory Visit

## 2022-11-08 DIAGNOSIS — S32020G Wedge compression fracture of second lumbar vertebra, subsequent encounter for fracture with delayed healing: Secondary | ICD-10-CM

## 2022-11-08 HISTORY — PX: IR RADIOLOGIST EVAL & MGMT: IMG5224

## 2022-11-08 NOTE — Consult Note (Signed)
Chief Complaint: Patient was seen in consultation today for delayed healing of L2 compression fracture at the request of Miller,Mark F  Referring Physician(s): Miller,Mark F  History of Present Illness: Brett Blake is a 81 y.o. male who developed onset of lower back pain approximately 6 weeks ago when he suffered a fall landing on his tailbone.  His pain was fairly well managed until a week later when his dog passed away in the house.  He attempted to pick up the 95 pound dog off the floor and developed acute exacerbation of his lower back pain.  The pain is in the low back radiating toward the right buttock.  It is quite severe and he rates it a 7 out of 10 on a 10 point scale.  It hurts to walk, stand, bend or change positions from sitting to standing or to get up from laying down.  His pain is only controlled when he sits and does not move.  He is only taking Tylenol as needed for pain.  He reports that he has not been able to tolerate stronger pain medications.  His pain is debilitating.  He scored a 11 out of 24 on the [employer] disability questionnaire.  Past Medical History:  Diagnosis Date   Anxiety    Arthritis    Asthma    Colon polyps    COPD (chronic obstructive pulmonary disease) ([place])    Depression    Diabetes ([place])    Difficult intubation    GERD (gastroesophageal reflux disease)    High cholesterol    Hyperlipidemia    Hypertension    Hyponatremia 02/23/2022   Sleep apnea     Past Surgical History:  Procedure Laterality Date   BRONCHOSCOPY     CARDIAC CATHETERIZATION Left 11/04/2016   Procedure: Left Heart Cath and Coronary Angiography;  Surgeon: Teodoro Spray, MD;  Location: [facility];  Service: Cardiovascular;  Laterality: Left;   CARDIAC CATHETERIZATION     CHOLECYSTECTOMY N/A 05/09/2019   Procedure: LAPAROSCOPIC CHOLECYSTECTOMY WITH INTRAOPERATIVE CHOLANGIOGRAM;  Surgeon: Jules Husbands, MD;  Location: [facility];  Service: General;   Laterality: N/A;   COLOSTOMY     IR RADIOLOGIST EVAL & MGMT  11/08/2022   LEFT HEART CATH AND CORONARY ANGIOGRAPHY N/A 12/14/2021   Procedure: LEFT HEART CATH AND CORONARY ANGIOGRAPHY;  Surgeon: Corey Skains, MD;  Location: [facility];  Service: Cardiovascular;  Laterality: N/A;   ROTATOR CUFF REPAIR Left    THUMB ARTHROSCOPY  2015   TONSILLECTOMY  1952   TONSILLECTOMY      Allergies: Ace inhibitors, Metoprolol, Nsaids, and Sulfa antibiotics  Medications: Prior to Admission medications   Medication Sig Start Date End Date Taking? Authorizing Provider  albuterol (ACCUNEB) 1.25 MG/3ML nebulizer solution  04/08/19  Yes [provider]  aspirin EC 81 MG tablet Take 81 mg by mouth daily. Swallow whole.   Yes [provider]  diltiazem (CARDIZEM CD) 240 MG 24 hr capsule Take 240 mg by mouth every morning. 12/07/21  Yes [provider]  donepezil (ARICEPT) 5 MG tablet Take 5 mg by mouth at bedtime. 05/10/22  Yes [provider]  fluticasone (FLONASE) 50 MCG/ACT nasal spray Place 1 spray into both nostrils daily as needed for allergies or rhinitis.   Yes [provider]  folic acid (FOLVITE) 1 MG tablet Take 1 tablet (1 mg total) by mouth daily. 03/01/22  Yes Loletha Grayer, MD  gabapentin (NEURONTIN) 300  MG capsule Take 1 capsule (300 mg total) by mouth at bedtime. 02/28/22  Yes Wieting, Richard, MD  guaiFENesin (ROBITUSSIN) 100 MG/5ML liquid Take 5 mLs by mouth every 4 (four) hours as needed for cough or to loosen phlegm. 02/28/22  Yes Wieting, Richard, MD  melatonin 5 MG TABS Take 1 tablet (5 mg total) by mouth at bedtime. 02/28/22  Yes Wieting, Richard, MD  montelukast (SINGULAIR) 10 MG tablet Take 1 tablet (10 mg total) by mouth at bedtime. 02/28/22  Yes Wieting, Richard, MD  Multiple Vitamins-Minerals (MULTIVITAMIN PO) Take 1 tablet by mouth daily.   Yes [provider]  nitroGLYCERIN (NITROSTAT) 0.4 MG SL tablet Place 0.4 mg under  the tongue every 5 (five) minutes as needed for chest pain.   Yes [provider]  omeprazole (PRILOSEC) 40 MG capsule Take 40 mg by mouth daily. 04/20/22  Yes [provider]  ondansetron (ZOFRAN-ODT) 4 MG disintegrating tablet Take 1 tablet (4 mg total) by mouth every 8 (eight) hours as needed for nausea or vomiting. 06/11/22  Yes Carrie Mew, MD  pantoprazole (PROTONIX) 40 MG tablet Take 1 tablet (40 mg total) by mouth 2 (two) times daily before a meal. 02/28/22  Yes Wieting, Richard, MD  propranolol (INDERAL) 10 MG tablet Take 1 tablet (10 mg total) by mouth 2 (two) times daily. 02/28/22  Yes Loletha Grayer, MD  QUEtiapine (SEROQUEL) 25 MG tablet One tab po qhs (can take extra dose if not sleeping) 02/28/22  Yes Wieting, Richard, MD  rosuvastatin (CRESTOR) 5 MG tablet Take 5 mg by mouth daily. 05/10/22  Yes [provider]  SYMBICORT 80-4.5 MCG/ACT inhaler Inhale 2 puffs into the lungs 2 (two) times daily as needed.  05/19/20  Yes [provider]  venlafaxine XR (EFFEXOR-XR) 37.5 MG 24 hr capsule Take 37.5 mg by mouth daily. 12/07/21  Yes [provider]  benzonatate (TESSALON) 200 MG capsule Take 1 capsule (200 mg total) by mouth 3 (three) times daily as needed for cough. Patient not taking: Reported on 11/08/2022 02/28/22   Loletha Grayer, MD  isosorbide mononitrate (IMDUR) 30 MG 24 hr tablet Take 30 mg by mouth daily. Patient not taking: Reported on 02/15/2022 06/16/20   [provider]  polyethylene glycol (MIRALAX / GLYCOLAX) 17 g packet Take 17 g by mouth daily as needed for moderate constipation. Patient not taking: Reported on 11/08/2022 02/28/22   Loletha Grayer, MD  thiamine 100 MG tablet Take 1 tablet (100 mg total) by mouth daily. 03/01/22   Loletha Grayer, MD  vancomycin (VANCOCIN) 125 MG capsule Take 125 mg by mouth 4 (four) times daily. Patient not taking: Reported on 11/08/2022 05/24/22   [provider]     Family  History  Problem Relation Age of Onset   Heart attack Mother    Prostate cancer Father    Leukemia Brother    Prostate cancer Brother     Social History   Socioeconomic History   Marital status: Married    Spouse name: Not on file   Number of children: Not on file   Years of education: Not on file   Highest education level: Not on file  Occupational History   Not on file  Tobacco Use   Smoking status: Former    Packs/day: 1.00    Years: 15.00    Total pack years: 15.00    Types: Cigarettes   Smokeless tobacco: Never   Tobacco comments:    quit 1977  Vaping Use  Vaping Use: Not on file  Substance and Sexual Activity   Alcohol use: Yes    Alcohol/week: 1.0 - 2.0 standard drink of alcohol    Types: 1 - 2 Shots of liquor per week   Drug use: No   Sexual activity: Not Currently  Other Topics Concern   Not on file  Social History Narrative   Not on file   Social Determinants of Health   Financial Resource Strain: Not on file  Food Insecurity: Not on file  Transportation Needs: Not on file  Physical Activity: Not on file  Stress: Not on file  Social Connections: Not on file    Review of Systems: A 12 point ROS discussed and pertinent positives are indicated in the HPI above.  All other systems are negative.  Review of Systems  Vital Signs: BP (!) 147/84 (BP Location: Left Arm, Patient Position: Sitting, Cuff Size: Normal)   Pulse 89   Temp 98.4 F (36.9 C) (Oral)   Resp 14   SpO2 95%    Physical Exam Constitutional:      Appearance: Normal appearance.  HENT:     Head: Normocephalic and atraumatic.  Eyes:     General: No scleral icterus. Cardiovascular:     Rate and Rhythm: Normal rate.  Pulmonary:     Effort: Pulmonary effort is normal.  Abdominal:     General: There is no distension.     Palpations: Abdomen is soft.  Musculoskeletal:       Back:     Comments: TTP over the L2 spinous process   Skin:    General: Skin is warm and dry.   Neurological:     Mental Status: He is alert and oriented to person, place, and time.  Psychiatric:        Mood and Affect: Mood normal.        Behavior: Behavior normal.       Imaging: IR Radiologist Eval & Mgmt  Result Date: 11/08/2022 EXAM: NEW PATIENT OFFICE VISIT CHIEF COMPLAINT: SEE EPIC NOTE HISTORY OF PRESENT ILLNESS: SEE EPIC NOTE REVIEW OF SYSTEMS: SEE EPIC NOTE PHYSICAL EXAMINATION: SEE EPIC NOTE ASSESSMENT AND PLAN: SEE EPIC NOTE Electronically Signed   By: Jacqulynn Cadet M.D.   On: 11/08/2022 13:47   MR LUMBAR SPINE WO CONTRAST  Result Date: 11/02/2022 CLINICAL DATA:  Closed compression fracture of L2 lumbar vertebra with delayed healing, subsequent encounter. EXAM: MRI LUMBAR SPINE WITHOUT CONTRAST TECHNIQUE: Multiplanar, multisequence MR imaging of the lumbar spine was performed. No intravenous contrast was administered. COMPARISON:  CT lumbar spine October 30, 2022. FINDINGS: Segmentation:  Choose Alignment: 1 grade 1 anterolisthesis of L4 over L5. Small retrolisthesis of L5 over S1. Vertebrae: Compression fracture of the superior endplate of L2 resulting in approximately 25% height loss compared to 20% on prior CT. There is associated marrow edema, consistent with subacute process. No significant retropulsion. No evidence of discitis or aggressive bone lesion. Endplate degenerative changes at L4-5 and L5-S1. Conus medullaris and cauda equina: Conus extends to the L1 level. Conus and cauda equina appear normal. Paraspinal and other soft tissues: Negative. Disc levels: T12-L1: No spinal canal or neural foraminal stenosis. L1-2: Shallow disc bulge and mild facet degenerative changes without significant spinal canal or neural foraminal stenosis. L2-3: Shallow disc bulge and mild facet degenerative change without significant spinal canal or neural foraminal stenosis. L3-4: Shallow disc bulge and mild facet degenerative changes without significant spinal canal or neural foraminal  stenosis. L4-5: Loss of  disc height, right asymmetric disc bulge with associated osteophytic component, advanced right and mild left facet degenerative changes with trace joint effusion and mild epidural lipomatosis. Findings result in mild spinal canal stenosis with moderate narrowing of the right subarticular zone and moderate right neural foraminal narrowing. L5-S1: Loss of disc height, disc bulge with associated left foraminal/far lateral osteophytic component and mild facet degenerative changes. Findings result in mild right and mild-to-moderate left neural foraminal narrowing. No significant spinal canal stenosis. IMPRESSION: 1. Subacute compression fracture of the superior endplate of L2 with approximately 25% height loss. No significant retropulsion. 2. Degenerative changes at L4-5 resulting in mild spinal canal stenosis with moderate narrowing of the right subarticular zone and moderate right neural foraminal narrowing. 3. Mild right and mild-to-moderate left neural foraminal narrowing at L5-S1. Electronically Signed   By: Pedro Earls M.D.   On: 11/02/2022 16:55   CT Lumbar Spine Wo Contrast  Result Date: 10/30/2022 CLINICAL DATA:  Initial evaluation for acute trauma, ataxia. EXAM: CT LUMBAR SPINE WITHOUT CONTRAST TECHNIQUE: Multidetector CT imaging of the lumbar spine was performed without intravenous contrast administration. Multiplanar CT image reconstructions were also generated. RADIATION DOSE REDUCTION: This exam was performed according to the departmental dose-optimization program which includes automated exposure control, adjustment of the mA and/or kV according to patient size and/or use of iterative reconstruction technique. COMPARISON:  CT from 10/24/2022. FINDINGS: Segmentation: Standard. Lowest well-formed disc space labeled the L5-S1 level. Alignment: Stable line mint with trace degenerative retrolisthesis of L5 on S1, with trace anterolisthesis of L4 on L5. Findings  likely chronic and facet mediated. Vertebrae: Previously identified probable early subacute compression fracture at the superior endplate of L2 again seen. Slightly increased sclerosis about the fracture since prior. Height loss is not significantly changed or progressed without bony retropulsion. Additional probable subacute healing fractures of the right posterior eleventh and twelfth ribs noted as well. No other new or interval fracture. No discrete or worrisome osseous lesions. Paraspinal and other soft tissues: Paraspinous soft tissues demonstrate no acute finding. Aorto bi-iliac atherosclerotic disease. Disc levels: Underlying advanced chronic disc and endplate degeneration at L4-5 and L5-S1 with disc desiccation and endplate spurring. Lower lumbar facet hypertrophy, greatest at L4-5 on the right. IMPRESSION: 1. Previously identified probable early subacute compression fracture at the superior endplate of L2 again seen. Include Reese sclerosis about the fracture, but without progressive height loss or bony retropulsion. 2. Additional probable subacute healing fractures of the right posterior eleventh and twelfth ribs. 3. No other new or acute traumatic osseous injury within the lumbar spine. 4. Underlying advanced chronic disc and endplate degeneration at L4-5 and L5-S1. Aortic Atherosclerosis (ICD10-I70.0). Electronically Signed   By: Jeannine Boga M.D.   On: 10/30/2022 01:22   CT Head Wo Contrast  Result Date: 10/30/2022 CLINICAL DATA:  Fall, ETOH EXAM: CT HEAD WITHOUT CONTRAST CT CERVICAL SPINE WITHOUT CONTRAST TECHNIQUE: Multidetector CT imaging of the head and cervical spine was performed following the standard protocol without intravenous contrast. Multiplanar CT image reconstructions of the cervical spine were also generated. RADIATION DOSE REDUCTION: This exam was performed according to the departmental dose-optimization program which includes automated exposure control, adjustment of the mA  and/or kV according to patient size and/or use of iterative reconstruction technique. COMPARISON:  10/24/2022 FINDINGS: CT HEAD FINDINGS Brain: No evidence of acute infarction, hemorrhage, hydrocephalus, extra-axial collection or mass lesion/mass effect. Global cortical atrophy. Subcortical white matter and periventricular small vessel ischemic changes. Left basal ganglia calcifications. Vascular: Mild  intracranial atherosclerosis. Skull: Normal. Negative for fracture or focal lesion. Sinuses/Orbits: The visualized paranasal sinuses are essentially clear. The mastoid air cells are unopacified. Other: None. CT CERVICAL SPINE FINDINGS Alignment: Normal cervical lordosis. Skull base and vertebrae: No acute fracture. No primary bone lesion or focal pathologic process. Soft tissues and spinal canal: No prevertebral fluid or swelling. No visible canal hematoma. Disc levels: Mild degenerative changes of the mid cervical spine. Spinal canal is patent. Upper chest: Visualized lung apices are clear. Other: Visualized thyroid is unremarkable. IMPRESSION: No evidence of acute intracranial abnormality. Atrophy with small vessel ischemic changes. No evidence of traumatic injury to the cervical spine. Mild degenerative changes. Electronically Signed   By: Julian Hy M.D.   On: 10/30/2022 01:19   CT Cervical Spine Wo Contrast  Result Date: 10/30/2022 CLINICAL DATA:  Fall, ETOH EXAM: CT HEAD WITHOUT CONTRAST CT CERVICAL SPINE WITHOUT CONTRAST TECHNIQUE: Multidetector CT imaging of the head and cervical spine was performed following the standard protocol without intravenous contrast. Multiplanar CT image reconstructions of the cervical spine were also generated. RADIATION DOSE REDUCTION: This exam was performed according to the departmental dose-optimization program which includes automated exposure control, adjustment of the mA and/or kV according to patient size and/or use of iterative reconstruction technique.  COMPARISON:  10/24/2022 FINDINGS: CT HEAD FINDINGS Brain: No evidence of acute infarction, hemorrhage, hydrocephalus, extra-axial collection or mass lesion/mass effect. Global cortical atrophy. Subcortical white matter and periventricular small vessel ischemic changes. Left basal ganglia calcifications. Vascular: Mild intracranial atherosclerosis. Skull: Normal. Negative for fracture or focal lesion. Sinuses/Orbits: The visualized paranasal sinuses are essentially clear. The mastoid air cells are unopacified. Other: None. CT CERVICAL SPINE FINDINGS Alignment: Normal cervical lordosis. Skull base and vertebrae: No acute fracture. No primary bone lesion or focal pathologic process. Soft tissues and spinal canal: No prevertebral fluid or swelling. No visible canal hematoma. Disc levels: Mild degenerative changes of the mid cervical spine. Spinal canal is patent. Upper chest: Visualized lung apices are clear. Other: Visualized thyroid is unremarkable. IMPRESSION: No evidence of acute intracranial abnormality. Atrophy with small vessel ischemic changes. No evidence of traumatic injury to the cervical spine. Mild degenerative changes. Electronically Signed   By: Julian Hy M.D.   On: 10/30/2022 01:19   CT Lumbar Spine Wo Contrast  Result Date: 10/24/2022 CLINICAL DATA:  81 year old male with 2-3 weeks of back pain after recurrent falls. EXAM: CT LUMBAR SPINE WITHOUT CONTRAST TECHNIQUE: Multidetector CT imaging of the lumbar spine was performed without intravenous contrast administration. Multiplanar CT image reconstructions were also generated. RADIATION DOSE REDUCTION: This exam was performed according to the departmental dose-optimization program which includes automated exposure control, adjustment of the mA and/or kV according to patient size and/or use of iterative reconstruction technique. COMPARISON:  CT Abdomen and Pelvis 06/11/2022. FINDINGS: Segmentation: Normal. Alignment: Stable lumbar lordosis  since June. Subtle lower lumbar spondylolisthesis. Vertebrae: Diffuse osteopenia. Subtle superior endplate compression at L2 appears increased since June (series 6, image 37). L2 pedicles and posterior elements appear intact. And there are subacute appearing fractures of the right posterior 11th and 12th ribs at the costovertebral junctions, new since June. Other lumbar levels appear intact. Intact visible sacrum and SI joints. Paraspinal and other soft tissues: Stable. Aortoiliac calcified atherosclerosis. Lumbar paraspinal soft tissues remain within normal limits. Disc levels: Advanced chronic lumbar disc and endplate degeneration at L4-L5 and L5-S1 with vacuum disc, endplate and facet spurring. IMPRESSION: 1. Subtle L2 compression fracture is new since June, age indeterminate but suspicious  for recent fracture in this setting. Superimposed subacute appearing posterior right 11th and 12th rib fractures. Underlying osteopenia. If specific therapy such as vertebroplasty is desired, Lumbar MRI or Nuclear Medicine Whole-body Bone Scan would best determine acuity. 2. No other acute osseous abnormality in the lumbar spine. Advanced chronic lumbar spine degeneration at L4-L5 and L5-S1. 3.  Aortic Atherosclerosis (ICD10-I70.0). Electronically Signed   By: Genevie Ann M.D.   On: 10/24/2022 10:01   CT Cervical Spine Wo Contrast  Result Date: 10/24/2022 CLINICAL DATA:  81 year old male with 2-3 weeks of back pain after recurrent falls. EXAM: CT CERVICAL SPINE WITHOUT CONTRAST TECHNIQUE: Multidetector CT imaging of the cervical spine was performed without intravenous contrast. Multiplanar CT image reconstructions were also generated. RADIATION DOSE REDUCTION: This exam was performed according to the departmental dose-optimization program which includes automated exposure control, adjustment of the mA and/or kV according to patient size and/or use of iterative reconstruction technique. COMPARISON:  Head CT today. FINDINGS:  Alignment: Mild straightening of cervical lordosis. Subtle degenerative anterolisthesis of C3 on C4. Cervicothoracic junction alignment is within normal limits. Bilateral posterior element alignment is within normal limits. Skull base and vertebrae: Visualized skull base is intact. No atlanto-occipital dissociation. C1 and C2 appear intact and aligned. No acute osseous abnormality identified. Osteopenia. Soft tissues and spinal canal: No prevertebral fluid or swelling. No visible canal hematoma. Negative noncontrast visible neck soft tissues. Disc levels: Cervical spine degeneration although age-appropriate at many levels. Fairly advanced disc and endplate degeneration K0-U5 through C6-C7. Up to mild associated spinal stenosis. Upper chest: Upper thoracic levels demonstrate osteopenia but appear to be intact. Negative lung apices. Negative noncontrast thoracic inlet. IMPRESSION: 1. No acute traumatic injury identified in the cervical spine. 2. Osteopenia. 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 Electronically Signed   By: Genevie Ann M.D.   On: 10/24/2022 09:57   CT HEAD WO CONTRAST (5MM)  Result Date: 10/24/2022 CLINICAL DATA:  81 year old male with 2-3 weeks of back pain after recurrent falls. EXAM: CT HEAD WITHOUT CONTRAST TECHNIQUE: Contiguous axial images were obtained from the base of the skull through the vertex without intravenous contrast. RADIATION DOSE REDUCTION: This exam was performed according to the departmental dose-optimization program which includes automated exposure control, adjustment of the mA and/or kV according to patient size and/or use of iterative reconstruction technique. COMPARISON:  Brain MRI 09/15/2020.  Head CT 02/14/2022. FINDINGS: Brain: Conspicuous chronic dystrophic calcification in the dorsal left thalamus is stable since February and was present on the 2021 MRI. Stable cerebral volume. No midline shift, ventriculomegaly, mass effect, evidence of mass  lesion, intracranial hemorrhage or evidence of cortically based acute infarction. Outside of the left thalamus gray-white matter differentiation appears within normal limits for age. Vascular: Calcified atherosclerosis at the skull base. No suspicious intracranial vascular hyperdensity. Skull: Stable.  No acute osseous abnormality identified. Sinuses/Orbits: Visualized paranasal sinuses and mastoids are clear. Other: No acute orbit or scalp soft tissue injury. IMPRESSION: 1. No acute intracranial abnormality or acute traumatic injury identified. 2. 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 Electronically Signed   By: Genevie Ann M.D.   On: 10/24/2022 09:54    Labs:  CBC: Recent Labs    02/23/22 0446 02/27/22 0445 06/11/22 0954 10/24/22 0905  WBC 6.6 2.7* 5.4 5.6  HGB 10.3* 10.2* 14.6 13.3  HCT 30.4* 30.8* 43.6 39.2  PLT 254 398 205 172    COAGS: Recent Labs    12/11/21 0430  INR  1.0  APTT 26    BMP: Recent Labs    02/23/22 0446 02/27/22 0445 06/11/22 0954 10/24/22 0905  NA 131* 137 139 136  K 3.8 3.8 3.2* 4.3  CL 97* 100 102 97*  CO2 '27 27 25 22  '$ GLUCOSE 145* 158* 124* 115*  BUN '9 15 14 '$ 38*  CALCIUM 8.7* 9.1 9.1 8.4*  CREATININE 0.74 0.80 0.87 1.08  GFRNONAA >60 >60 >60 >60    LIVER FUNCTION TESTS: Recent Labs    02/15/22 0557 02/21/22 0525 06/11/22 0954 10/24/22 0905  BILITOT 0.8 0.6 0.9 1.0  AST 44* 13* 25 43*  ALT 39 14 16 51*  ALKPHOS 67 91 89 66  PROT 5.9* 6.0* 7.5 6.1*  ALBUMIN 3.0* 2.6* 3.8 3.1*    TUMOR MARKERS: No results for input(s): "AFPTM", "CEA", "CA199", "CHROMGRNA" in the last 8760 hours.  Assessment and Plan:   Patient has suffered subacute fracture of the L2 vertebra.   History and exam have demonstrated the following:  Acute/Subacute fracture by imaging dated 11/01/22, Pain on exam concordant with level of fracture, Failure of conservative therapy and pain refractory to narcotic pain mediation,  Inability to tolerate narcotic pain medication due to side effects, and Significant disability on the [place] with 11/24 positive symptoms, reflecting significant impact/impairment of (ADLs)   ICD-10-CM Codes that Support Medical Necessity ([email or web address]?[email or web address])  S32.020A    Wedge compression fracture of second lumbar vertebra, initial encounter for closed fracture    Plan:  L2 vertebral body augmentation with balloon kyphoplasty  Post-procedure disposition: outpatient [facility] Medication holds: ASA  The patient has suffered a fracture of the L2 vertebral body. It is recommended that patients aged 78 years or older be evaluated for possible testing or treatment of osteoporosis. A copy of this consult report is sent to the patient's referring physician.  Advanced Care Plan: The patient did not want to provide an [place] at the time of this visit     Total time spent on today's visit was over  30 Minutes , including both face-to-face time and non face-to-face time, personally spent on review of chart (including labs and relevant imaging), discussing further workup and treatment options, referral to specialist if needed, reviewing outside records if pertinent, answering patient questions, and coordinating care regarding L2 fracture as well as management strategy.    Thank you for this interesting consult.  I greatly enjoyed meeting Deaglan Lile and look forward to participating in their care.  A copy of this report was sent to the requesting provider on this date.  Electronically Signed: Antonietta Jewel Teofila Bowery 11/08/2022, 2:10 PM

## 2022-11-10 ENCOUNTER — Ambulatory Visit
Admission: RE | Admit: 2022-11-10 | Discharge: 2022-11-10 | Disposition: A | Discharge status: Home / Self Care | Payer: Medicare Other | Source: Ambulatory Visit | Attending: Internal Medicine | Admitting: Internal Medicine

## 2022-11-10 DIAGNOSIS — S32020G Wedge compression fracture of second lumbar vertebra, subsequent encounter for fracture with delayed healing: Secondary | ICD-10-CM

## 2022-11-10 HISTORY — PX: IR KYPHO LUMBAR INC FX REDUCE BONE BX UNI/BIL CANNULATION INC/IMAGING: IMG5519

## 2022-11-10 MED ORDER — ONDANSETRON HCL 4 MG/2ML IJ SOLN
4.0000 mg | Freq: Once | INTRAMUSCULAR | Status: AC
  Administered 2022-11-10: 4 mg via INTRAVENOUS

## 2022-11-10 MED ORDER — CEFAZOLIN SODIUM-DEXTROSE 2-4 GM/100ML-% IV SOLN
2.0000 g | INTRAVENOUS | Status: AC
  Administered 2022-11-10: 2 g via INTRAVENOUS

## 2022-11-10 MED ORDER — ACETAMINOPHEN 10 MG/ML IV SOLN
1000.0000 mg | Freq: Once | INTRAVENOUS | Status: AC
  Administered 2022-11-10: 1000 mg via INTRAVENOUS

## 2022-11-10 MED ORDER — MIDAZOLAM HCL 2 MG/2ML IJ SOLN
1.0000 mg | INTRAMUSCULAR | Status: DC | PRN
  Administered 2022-11-10 (×2): 1 mg via INTRAVENOUS

## 2022-11-10 MED ORDER — SODIUM CHLORIDE 0.9 % IV SOLN: INTRAVENOUS | Status: DC

## 2022-11-10 MED ORDER — FENTANYL CITRATE PF 50 MCG/ML IJ SOSY
25.0000 ug | PREFILLED_SYRINGE | INTRAMUSCULAR | Status: DC | PRN
  Administered 2022-11-10 (×2): 25 ug via INTRAVENOUS

## 2022-11-10 NOTE — Discharge Instructions (Signed)
Kyphoplasty Post Procedure Discharge Instructions  May resume a regular diet and any medications that you routinely take (including pain medications). However, if you are taking Aspirin or an anticoagulant/blood thinner you will be told when you can resume taking these by the healthcare provider. No driving day of procedure. The day of your procedure take it easy. You may use an ice pack as needed to injection sites on back.  Ice to back 30 minutes on and 30 minutes off, as needed. May remove bandaids tomorrow after taking a shower. Replace daily with a clean bandaid until healed.  Do not lift anything heavier than a milk jug for 1-2 weeks or determined by your physician.  Follow up with your physician in 2 weeks.    Please contact our office at [phone] for the following symptoms or if you have any questions:  Fever greater than 100 degrees Increased swelling, pain, or redness at injection site. Increased back and/or leg pain New numbness or change in symptoms from before the procedure.    Thank you for visiting [facility].  May resume aspirin immediately after procedure. 

## 2022-11-10 NOTE — Progress Notes (Signed)
Wife returned with patient's Diltiazem CD '240mg'$  and Propranolol '40mg'$ , which he took at [phone] with two small sips of water.  Patient states these medications do not make him nauseous.

## 2022-11-10 NOTE — Progress Notes (Signed)
Pt back in nursing recovery area. Pt still drowsy from procedure but will wake up when spoken to. Pt follows commands, talks in complete sentences and has no complaints at this time. Pt will remain in nursing station until discharge.  ?

## 2022-11-14 NOTE — Progress Notes (Signed)
Phone call to pt to follow up from her kyphoplasty on 11/10/22. Pt reports he has not noticed his pain because he has been sick. The two days after his procedure he reports having a 101.1 fever, but did not call despite given discharge instructions to do so.Pt. reports his wife has been to a lot of funerals and hasn't been home to take care of him as much. Pt. Reports that he is really weak feeling and delirious at times. He reports being able to walk with his walker and cain okay. Pt. Wife has not taken the bandaids off and looked at his back per pt. I told Brett Blake she needs to take them off and look when she gets home and he should see his PCP Dr. [name] since he still isn't feeling well. Dr. Laurence Ferrari made aware of symptomes post procedure. RN will wait for MD response and call pt. Back with further instructions if needed. Pt has no further complaints at this time and will be scheduled for a telephone follow up with Dr. Dwaine Gale next week. Pt advised to call back if anything were to change or any concerns arise and we will arrange an in person appointment. Pt verbalized understanding.

## 2022-11-16 ENCOUNTER — Other Ambulatory Visit: Payer: Self-pay | Admitting: Interventional Radiology

## 2022-11-16 DIAGNOSIS — S32020A Wedge compression fracture of second lumbar vertebra, initial encounter for closed fracture: Secondary | ICD-10-CM

## 2022-11-18 ENCOUNTER — Emergency Department: Payer: Medicare Other

## 2022-11-18 ENCOUNTER — Other Ambulatory Visit: Payer: Self-pay

## 2022-11-18 ENCOUNTER — Observation Stay
Admission: EM | Admit: 2022-11-18 | Discharge: 2022-11-20 | Disposition: A | Discharge status: Home / Self Care | Payer: Medicare Other | Attending: Internal Medicine | Admitting: Internal Medicine

## 2022-11-18 DIAGNOSIS — J45909 Unspecified asthma, uncomplicated: Secondary | ICD-10-CM | POA: Insufficient documentation

## 2022-11-18 DIAGNOSIS — E1169 Type 2 diabetes mellitus with other specified complication: Secondary | ICD-10-CM | POA: Diagnosis not present

## 2022-11-18 DIAGNOSIS — R0789 Other chest pain: Secondary | ICD-10-CM | POA: Insufficient documentation

## 2022-11-18 DIAGNOSIS — R0602 Shortness of breath: Secondary | ICD-10-CM | POA: Diagnosis present

## 2022-11-18 DIAGNOSIS — I10 Essential (primary) hypertension: Secondary | ICD-10-CM | POA: Diagnosis not present

## 2022-11-18 DIAGNOSIS — D638 Anemia in other chronic diseases classified elsewhere: Secondary | ICD-10-CM | POA: Diagnosis present

## 2022-11-18 DIAGNOSIS — Z1152 Encounter for screening for COVID-19: Secondary | ICD-10-CM | POA: Diagnosis not present

## 2022-11-18 DIAGNOSIS — R072 Precordial pain: Secondary | ICD-10-CM

## 2022-11-18 DIAGNOSIS — D649 Anemia, unspecified: Secondary | ICD-10-CM | POA: Insufficient documentation

## 2022-11-18 DIAGNOSIS — F02A0 Dementia in other diseases classified elsewhere, mild, without behavioral disturbance, psychotic disturbance, mood disturbance, and anxiety: Secondary | ICD-10-CM | POA: Diagnosis present

## 2022-11-18 DIAGNOSIS — Z79899 Other long term (current) drug therapy: Secondary | ICD-10-CM | POA: Insufficient documentation

## 2022-11-18 DIAGNOSIS — Z7982 Long term (current) use of aspirin: Secondary | ICD-10-CM | POA: Diagnosis not present

## 2022-11-18 DIAGNOSIS — F0280 Dementia in other diseases classified elsewhere without behavioral disturbance: Secondary | ICD-10-CM | POA: Insufficient documentation

## 2022-11-18 DIAGNOSIS — Z87891 Personal history of nicotine dependence: Secondary | ICD-10-CM | POA: Diagnosis not present

## 2022-11-18 DIAGNOSIS — Z7951 Long term (current) use of inhaled steroids: Secondary | ICD-10-CM | POA: Insufficient documentation

## 2022-11-18 DIAGNOSIS — E785 Hyperlipidemia, unspecified: Secondary | ICD-10-CM | POA: Diagnosis not present

## 2022-11-18 DIAGNOSIS — R079 Chest pain, unspecified: Secondary | ICD-10-CM | POA: Diagnosis present

## 2022-11-18 DIAGNOSIS — J441 Chronic obstructive pulmonary disease with (acute) exacerbation: Secondary | ICD-10-CM | POA: Diagnosis not present

## 2022-11-18 DIAGNOSIS — R000 Tachycardia, unspecified: Secondary | ICD-10-CM | POA: Diagnosis not present

## 2022-11-18 DIAGNOSIS — G309 Alzheimer's disease, unspecified: Secondary | ICD-10-CM | POA: Insufficient documentation

## 2022-11-18 DIAGNOSIS — G4730 Sleep apnea, unspecified: Secondary | ICD-10-CM | POA: Diagnosis present

## 2022-11-18 LAB — BASIC METABOLIC PANEL
Anion gap: 14 (ref 5–15)
BUN: 15 mg/dL (ref 8–23)
CO2: 25 mmol/L (ref 22–32)
Calcium: 8.3 mg/dL — ABNORMAL LOW (ref 8.9–10.3)
Chloride: 95 mmol/L — ABNORMAL LOW (ref 98–111)
Creatinine, Ser: 1.14 mg/dL (ref 0.61–1.24)
GFR, Estimated: >60 mL/min (ref >60)
Glucose, Bld: 138 mg/dL — ABNORMAL HIGH (ref 70–99)
Potassium: 3.7 mmol/L (ref 3.5–5.1)
Sodium: 134 mmol/L — ABNORMAL LOW (ref 135–145)

## 2022-11-18 LAB — RESP PANEL BY RT-PCR (FLU A&B, COVID) ARPGX2
Influenza A by PCR: NEGATIVE
Influenza B by PCR: NEGATIVE
SARS Coronavirus 2 by RT PCR: NEGATIVE

## 2022-11-18 LAB — CBC
HCT: 36.1 % — ABNORMAL LOW (ref 39.0–52.0)
Hemoglobin: 12.3 g/dL — ABNORMAL LOW (ref 13.0–17.0)
MCH: 32.5 pg (ref 26.0–34.0)
MCHC: 34.1 g/dL (ref 30.0–36.0)
MCV: 95.5 fL (ref 80.0–100.0)
Platelets: 340 10*3/uL (ref 150–400)
RBC: 3.78 MIL/uL — ABNORMAL LOW (ref 4.22–5.81)
RDW: 14 % (ref 11.5–15.5)
WBC: 5.2 10*3/uL (ref 4.0–10.5)
nRBC: 0.4 % — ABNORMAL HIGH (ref 0.0–0.2)

## 2022-11-18 LAB — TROPONIN I (HIGH SENSITIVITY)
Troponin I (High Sensitivity): 14 ng/L (ref <18)
Troponin I (High Sensitivity): 13 ng/L (ref <18)

## 2022-11-18 LAB — D-DIMER, QUANTITATIVE: D-Dimer, Quant: 0.94 ug/mL-FEU — ABNORMAL HIGH (ref 0.00–0.50)

## 2022-11-18 MED ORDER — ONDANSETRON HCL 4 MG/2ML IJ SOLN: 4.0000 mg | Freq: Four times a day (QID) | INTRAMUSCULAR | Status: DC | PRN

## 2022-11-18 MED ORDER — AZITHROMYCIN 250 MG PO TABS
500.0000 mg | ORAL_TABLET | Freq: Every day | ORAL | Status: DC
  Administered 2022-11-19 – 2022-11-20 (×2): 500 mg via ORAL
  Filled 2022-11-18 (×2): qty 2

## 2022-11-18 MED ORDER — ENOXAPARIN SODIUM 40 MG/0.4ML IJ SOSY
40.0000 mg | PREFILLED_SYRINGE | INTRAMUSCULAR | Status: DC
  Administered 2022-11-18 – 2022-11-19 (×2): 40 mg via SUBCUTANEOUS
  Filled 2022-11-18 (×2): qty 0.4

## 2022-11-18 MED ORDER — GABAPENTIN 300 MG PO CAPS
300.0000 mg | ORAL_CAPSULE | Freq: Every day | ORAL | Status: DC
  Administered 2022-11-18 – 2022-11-19 (×2): 300 mg via ORAL
  Filled 2022-11-18 (×2): qty 1

## 2022-11-18 MED ORDER — ONDANSETRON HCL 4 MG PO TABS: 4.0000 mg | ORAL_TABLET | Freq: Four times a day (QID) | ORAL | Status: DC | PRN

## 2022-11-18 MED ORDER — IOHEXOL 350 MG/ML SOLN
75.0000 mL | Freq: Once | INTRAVENOUS | Status: AC | PRN
  Administered 2022-11-18: 75 mL via INTRAVENOUS

## 2022-11-18 MED ORDER — PANTOPRAZOLE SODIUM 40 MG PO TBEC
40.0000 mg | DELAYED_RELEASE_TABLET | Freq: Every day | ORAL | Status: DC
  Administered 2022-11-19 – 2022-11-20 (×2): 40 mg via ORAL
  Filled 2022-11-18 (×2): qty 1

## 2022-11-18 MED ORDER — PROPRANOLOL HCL 20 MG PO TABS
10.0000 mg | ORAL_TABLET | Freq: Two times a day (BID) | ORAL | Status: DC
  Administered 2022-11-18 – 2022-11-20 (×4): 10 mg via ORAL
  Filled 2022-11-18 (×4): qty 1

## 2022-11-18 MED ORDER — ASPIRIN 81 MG PO TBEC
81.0000 mg | DELAYED_RELEASE_TABLET | Freq: Every day | ORAL | Status: DC
  Administered 2022-11-19 – 2022-11-20 (×2): 81 mg via ORAL
  Filled 2022-11-18 (×2): qty 1

## 2022-11-18 MED ORDER — QUETIAPINE FUMARATE 25 MG PO TABS
25.0000 mg | ORAL_TABLET | Freq: Every day | ORAL | Status: DC
  Administered 2022-11-18 – 2022-11-19 (×2): 25 mg via ORAL
  Filled 2022-11-18 (×2): qty 1

## 2022-11-18 MED ORDER — METHYLPREDNISOLONE SODIUM SUCC 125 MG IJ SOLR
125.0000 mg | Freq: Once | INTRAMUSCULAR | Status: AC
  Administered 2022-11-18: 125 mg via INTRAVENOUS
  Filled 2022-11-18: qty 2

## 2022-11-18 MED ORDER — ACETAMINOPHEN 325 MG PO TABS: 650.0000 mg | ORAL_TABLET | Freq: Four times a day (QID) | ORAL | Status: DC | PRN

## 2022-11-18 MED ORDER — PREDNISONE 20 MG PO TABS
40.0000 mg | ORAL_TABLET | Freq: Every day | ORAL | Status: DC
  Administered 2022-11-20: 40 mg via ORAL
  Filled 2022-11-18: qty 2

## 2022-11-18 MED ORDER — SODIUM CHLORIDE 0.9 % IV SOLN
500.0000 mg | INTRAVENOUS | Status: AC
  Administered 2022-11-18: 500 mg via INTRAVENOUS
  Filled 2022-11-18: qty 5

## 2022-11-18 MED ORDER — ALBUTEROL SULFATE (2.5 MG/3ML) 0.083% IN NEBU: 2.5000 mg | INHALATION_SOLUTION | RESPIRATORY_TRACT | Status: DC | PRN

## 2022-11-18 MED ORDER — DONEPEZIL HCL 5 MG PO TABS
5.0000 mg | ORAL_TABLET | Freq: Every day | ORAL | Status: DC
  Administered 2022-11-18 – 2022-11-19 (×2): 5 mg via ORAL
  Filled 2022-11-18 (×2): qty 1

## 2022-11-18 MED ORDER — METHYLPREDNISOLONE SODIUM SUCC 125 MG IJ SOLR
125.0000 mg | Freq: Two times a day (BID) | INTRAMUSCULAR | Status: AC
  Administered 2022-11-19 (×2): 125 mg via INTRAVENOUS
  Filled 2022-11-18 (×2): qty 2

## 2022-11-18 MED ORDER — DILTIAZEM HCL ER COATED BEADS 120 MG PO CP24
240.0000 mg | ORAL_CAPSULE | Freq: Every morning | ORAL | Status: DC
  Administered 2022-11-19 – 2022-11-20 (×2): 240 mg via ORAL
  Filled 2022-11-18: qty 1
  Filled 2022-11-18: qty 2

## 2022-11-18 MED ORDER — SODIUM CHLORIDE 0.9% FLUSH
3.0000 mL | Freq: Two times a day (BID) | INTRAVENOUS | Status: DC
  Administered 2022-11-19 – 2022-11-20 (×3): 3 mL via INTRAVENOUS

## 2022-11-18 MED ORDER — OXYCODONE HCL 5 MG PO TABS: 2.5000 mg | ORAL_TABLET | ORAL | Status: DC | PRN

## 2022-11-18 MED ORDER — MELATONIN 5 MG PO TABS
5.0000 mg | ORAL_TABLET | Freq: Every day | ORAL | Status: DC
  Administered 2022-11-18 – 2022-11-19 (×2): 5 mg via ORAL
  Filled 2022-11-18 (×2): qty 1

## 2022-11-18 MED ORDER — POLYETHYLENE GLYCOL 3350 17 G PO PACK: 17.0000 g | PACK | Freq: Every day | ORAL | Status: DC | PRN

## 2022-11-18 MED ORDER — ACETAMINOPHEN 650 MG RE SUPP: 650.0000 mg | Freq: Four times a day (QID) | RECTAL | Status: DC | PRN

## 2022-11-18 MED ORDER — SODIUM CHLORIDE 0.9 % IV BOLUS
1000.0000 mL | Freq: Once | INTRAVENOUS | Status: AC
  Administered 2022-11-18: 1000 mL via INTRAVENOUS

## 2022-11-18 MED ORDER — ROSUVASTATIN CALCIUM 5 MG PO TABS
5.0000 mg | ORAL_TABLET | Freq: Every day | ORAL | Status: DC
  Administered 2022-11-19 – 2022-11-20 (×2): 5 mg via ORAL
  Filled 2022-11-18 (×2): qty 1

## 2022-11-18 MED ORDER — LABETALOL HCL 5 MG/ML IV SOLN: 10.0000 mg | INTRAVENOUS | Status: DC | PRN

## 2022-11-18 MED ORDER — VENLAFAXINE HCL ER 37.5 MG PO CP24
37.5000 mg | ORAL_CAPSULE | Freq: Every day | ORAL | Status: DC
  Administered 2022-11-19 – 2022-11-20 (×2): 37.5 mg via ORAL
  Filled 2022-11-18 (×2): qty 1

## 2022-11-18 MED ORDER — SODIUM CHLORIDE 0.9 % IV SOLN: INTRAVENOUS | Status: AC

## 2022-11-18 MED ORDER — IPRATROPIUM-ALBUTEROL 0.5-2.5 (3) MG/3ML IN SOLN
3.0000 mL | Freq: Four times a day (QID) | RESPIRATORY_TRACT | Status: DC
  Administered 2022-11-18 – 2022-11-20 (×7): 3 mL via RESPIRATORY_TRACT
  Filled 2022-11-18 (×7): qty 3

## 2022-11-18 MED ORDER — IPRATROPIUM-ALBUTEROL 0.5-2.5 (3) MG/3ML IN SOLN
3.0000 mL | Freq: Once | RESPIRATORY_TRACT | Status: AC
  Administered 2022-11-18: 3 mL via RESPIRATORY_TRACT
  Filled 2022-11-18: qty 3

## 2022-11-18 NOTE — ED Triage Notes (Signed)
Pt to ED via [place]. Pt reports SOB, CP and cough x2 weeks. Pt had fall 2 weeks ago. Pt also reports his ears feel full. [facility] staff staffing pt HR 120s. Wife states pt had back surgery a week ago and that when he started having worse symptoms. Pt with hx asthma.

## 2022-11-18 NOTE — H&P (Signed)
History and Physical    Miquan Tandon NTI:[id number] DOB: Oct 21, 1941 DOA: 11/18/2022  PCP: [name], MD   Patient coming from: Home   Chief Complaint: Cough, SOB, chest pain   HPI: Brett Blake is a 81 y.o. male with medical history significant for COPD, OSA, hypertension, hyperlipidemia, dementia, and L2 fracture status post recent kyphoplasty presents to the emergency department with worsening cough, shortness of breath, and intermittent chest discomfort.  Patient reports that his chronic dyspnea and chronic cough began to worsen approximately 2 weeks ago, and he also began to intermittent chest discomfort he had a kyphoplasty little over a week ago, has had a loss of appetite since then, and his cough and shortness of breath has continued to worsen.  He denies any fever, chills, or leg swelling associated with this.  He is unable to identify any precipitating or alleviating factors for his chest discomfort.  ED Course: Upon arrival to the ED, patient is found to be afebrile and saturating well on room air with tachypnea, tachycardia, and elevated blood pressure.  EKG demonstrates sinus tachycardia with rate 137 and PVC.  Chest x-ray negative for acute cardiopulmonary disease.  Blood work notable for D-dimer 0.94 and normal troponin x 2.  COVID and influenza were negative.  CTA chest was negative for PE or acute airspace disease.  Patient was treated with a liter of normal saline, 125 mg IV Solu-Medrol, and DuoNeb in the ED.  Review of Systems:  All other systems reviewed and apart from HPI, are negative.  Past Medical History:  Diagnosis Date   Anxiety    Arthritis    Asthma    Colon polyps    COPD (chronic obstructive pulmonary disease) ([place])    Depression    Diabetes ([place])    Difficult intubation    GERD (gastroesophageal reflux disease)    High cholesterol    Hyperlipidemia    Hypertension    Hyponatremia 02/23/2022   Sleep apnea     Past Surgical History:   Procedure Laterality Date   BRONCHOSCOPY     CARDIAC CATHETERIZATION Left 11/04/2016   Procedure: Left Heart Cath and Coronary Angiography;  Surgeon: Teodoro Spray, MD;  Location: [facility];  Service: Cardiovascular;  Laterality: Left;   CARDIAC CATHETERIZATION     CHOLECYSTECTOMY N/A 05/09/2019   Procedure: LAPAROSCOPIC CHOLECYSTECTOMY WITH INTRAOPERATIVE CHOLANGIOGRAM;  Surgeon: Jules Husbands, MD;  Location: [facility];  Service: General;  Laterality: N/A;   COLOSTOMY     IR KYPHO LUMBAR INC FX REDUCE BONE BX UNI/BIL CANNULATION INC/IMAGING  11/10/2022   IR RADIOLOGIST EVAL & MGMT  11/08/2022   LEFT HEART CATH AND CORONARY ANGIOGRAPHY N/A 12/14/2021   Procedure: LEFT HEART CATH AND CORONARY ANGIOGRAPHY;  Surgeon: Corey Skains, MD;  Location: [facility];  Service: Cardiovascular;  Laterality: N/A;   ROTATOR CUFF REPAIR Left    THUMB ARTHROSCOPY  2015   TONSILLECTOMY  1952   TONSILLECTOMY      Social History:   reports that he has quit smoking. His smoking use included cigarettes. He has a 15.00 pack-year smoking history. He has never used smokeless tobacco. He reports current alcohol use of about 1.0 - 2.0 standard drink of alcohol per week. He reports that he does not use drugs.  Allergies  Allergen Reactions   Ace Inhibitors Cough   Metoprolol Diarrhea   Nsaids Rash   Sulfa Antibiotics Rash    Family History  Problem Relation  Age of Onset   Heart attack Mother    Prostate cancer Father    Leukemia Brother    Prostate cancer Brother      Prior to Admission medications   Medication Sig Start Date End Date Taking? Authorizing Provider  albuterol (ACCUNEB) 1.25 MG/3ML nebulizer solution  04/08/19   [provider]  aspirin EC 81 MG tablet Take 81 mg by mouth daily. Swallow whole.    [provider]  benzonatate (TESSALON) 200 MG capsule Take 1 capsule (200 mg total) by mouth 3 (three) times daily as needed for cough. Patient not  taking: Reported on 11/08/2022 02/28/22   Loletha Grayer, MD  diltiazem (CARDIZEM CD) 240 MG 24 hr capsule Take 240 mg by mouth every morning. 12/07/21   [provider]  donepezil (ARICEPT) 5 MG tablet Take 5 mg by mouth at bedtime. 05/10/22   [provider]  fluticasone (FLONASE) 50 MCG/ACT nasal spray Place 1 spray into both nostrils daily as needed for allergies or rhinitis.    [provider]  folic acid (FOLVITE) 1 MG tablet Take 1 tablet (1 mg total) by mouth daily. 03/01/22   Loletha Grayer, MD  gabapentin (NEURONTIN) 300 MG capsule Take 1 capsule (300 mg total) by mouth at bedtime. 02/28/22   Loletha Grayer, MD  guaiFENesin (ROBITUSSIN) 100 MG/5ML liquid Take 5 mLs by mouth every 4 (four) hours as needed for cough or to loosen phlegm. 02/28/22   Loletha Grayer, MD  isosorbide mononitrate (IMDUR) 30 MG 24 hr tablet Take 30 mg by mouth daily. Patient not taking: Reported on 02/15/2022 06/16/20   [provider]  melatonin 5 MG TABS Take 1 tablet (5 mg total) by mouth at bedtime. 02/28/22   Loletha Grayer, MD  montelukast (SINGULAIR) 10 MG tablet Take 1 tablet (10 mg total) by mouth at bedtime. 02/28/22   Loletha Grayer, MD  Multiple Vitamins-Minerals (MULTIVITAMIN PO) Take 1 tablet by mouth daily.    [provider]  nitroGLYCERIN (NITROSTAT) 0.4 MG SL tablet Place 0.4 mg under the tongue every 5 (five) minutes as needed for chest pain.    [provider]  omeprazole (PRILOSEC) 40 MG capsule Take 40 mg by mouth daily. 04/20/22   [provider]  ondansetron (ZOFRAN-ODT) 4 MG disintegrating tablet Take 1 tablet (4 mg total) by mouth every 8 (eight) hours as needed for nausea or vomiting. 06/11/22   Carrie Mew, MD  pantoprazole (PROTONIX) 40 MG tablet Take 1 tablet (40 mg total) by mouth 2 (two) times daily before a meal. 02/28/22   Wieting, Richard, MD  polyethylene glycol (MIRALAX / GLYCOLAX) 17 g packet Take 17 g by mouth daily  as needed for moderate constipation. Patient not taking: Reported on 11/08/2022 02/28/22   Loletha Grayer, MD  propranolol (INDERAL) 10 MG tablet Take 1 tablet (10 mg total) by mouth 2 (two) times daily. 02/28/22   Loletha Grayer, MD  QUEtiapine (SEROQUEL) 25 MG tablet One tab po qhs (can take extra dose if not sleeping) 02/28/22   Leslye Peer, Richard, MD  rosuvastatin (CRESTOR) 5 MG tablet Take 5 mg by mouth daily. 05/10/22   [provider]  SYMBICORT 80-4.5 MCG/ACT inhaler Inhale 2 puffs into the lungs 2 (two) times daily as needed.  05/19/20   [provider]  thiamine 100 MG tablet Take 1 tablet (100 mg total) by mouth daily. 03/01/22   Loletha Grayer, MD  vancomycin (VANCOCIN) 125 MG capsule Take 125 mg by mouth 4 (four)  times daily. Patient not taking: Reported on 11/08/2022 05/24/22   [provider]  venlafaxine XR (EFFEXOR-XR) 37.5 MG 24 hr capsule Take 37.5 mg by mouth daily. 12/07/21   [provider]    Physical Exam: Vitals:   11/18/22 1730 11/18/22 1800 11/18/22 1830 11/18/22 1900  BP: (!) 156/94 (!) 157/94 (!) 150/94 (!) 180/104  Pulse: (!) 123 (!) 115 (!) 119 (!) 117  Resp: 18 17 (!) 21 19  Temp:      TempSrc:      SpO2: 94% 95% 98% 94%     Constitutional: NAD, no pallor or diaphoresis  Eyes: PERTLA, lids and conjunctivae normal ENMT: Mucous membranes are moist. Posterior pharynx clear of any exudate or lesions.   Neck: supple, no masses  Respiratory: Diminished bilaterally with prolonged expiratory phase and wheezes. No accessory muscle use.  Cardiovascular: Rate ~120 and regular. No significant JVD. Abdomen: No distension, no tenderness, soft. Bowel sounds active.  Musculoskeletal: no clubbing / cyanosis. No joint deformity upper and lower extremities.   Skin: no significant rashes, lesions, ulcers. Warm, dry, well-perfused. Neurologic: CN 2-12 grossly intact. Moving all extremities. Alert and oriented.  Psychiatric: Calm. Cooperative.     Labs and Imaging on Admission: I have personally reviewed following labs and imaging studies  CBC: Recent Labs  Lab 11/18/22 1415  WBC 5.2  HGB 12.3*  HCT 36.1*  MCV 95.5  PLT 945   Basic Metabolic Panel: Recent Labs  Lab 11/18/22 1415  NA 134*  K 3.7  CL 95*  CO2 25  GLUCOSE 138*  BUN 15  CREATININE 1.14  CALCIUM 8.3*   GFR: CrCl cannot be calculated (Unknown ideal weight.). Liver Function Tests: No results for input(s): "AST", "ALT", "ALKPHOS", "BILITOT", "PROT", "ALBUMIN" in the last 168 hours. No results for input(s): "LIPASE", "AMYLASE" in the last 168 hours. No results for input(s): "AMMONIA" in the last 168 hours. Coagulation Profile: No results for input(s): "INR", "PROTIME" in the last 168 hours. Cardiac Enzymes: No results for input(s): "CKTOTAL", "CKMB", "CKMBINDEX", "TROPONINI" in the last 168 hours. BNP (last 3 results) No results for input(s): "PROBNP" in the last 8760 hours. HbA1C: No results for input(s): "HGBA1C" in the last 72 hours. CBG: No results for input(s): "GLUCAP" in the last 168 hours. Lipid Profile: No results for input(s): "CHOL", "HDL", "LDLCALC", "TRIG", "CHOLHDL", "LDLDIRECT" in the last 72 hours. Thyroid Function Tests: No results for input(s): "TSH", "T4TOTAL", "FREET4", "T3FREE", "THYROIDAB" in the last 72 hours. Anemia Panel: No results for input(s): "VITAMINB12", "FOLATE", "FERRITIN", "TIBC", "IRON", "RETICCTPCT" in the last 72 hours. Urine analysis:    Component Value Date/Time   COLORURINE YELLOW (A) 06/11/2022 1224   APPEARANCEUR CLEAR (A) 06/11/2022 1224   LABSPEC 1.036 (H) 06/11/2022 1224   PHURINE 6.0 06/11/2022 1224   GLUCOSEU NEGATIVE 06/11/2022 1224   HGBUR NEGATIVE 06/11/2022 1224   BILIRUBINUR NEGATIVE 06/11/2022 1224   KETONESUR NEGATIVE 06/11/2022 1224   PROTEINUR NEGATIVE 06/11/2022 1224   NITRITE NEGATIVE 06/11/2022 1224   LEUKOCYTESUR NEGATIVE 06/11/2022 1224   Sepsis  Labs: '@LABRCNTIP'$ (procalcitonin:4,lacticidven:4) ) Recent Results (from the past 240 hour(s))  Resp Panel by RT-PCR (Flu A&B, Covid) Anterior Nasal Swab     Status: None   Collection Time: 11/18/22  4:48 PM   Specimen: Anterior Nasal Swab  Result Value Ref Range Status   SARS Coronavirus 2 by RT PCR NEGATIVE NEGATIVE Final    Comment: (NOTE) SARS-CoV-2 target nucleic acids are NOT DETECTED.  The SARS-CoV-2 RNA is generally  detectable in upper respiratory specimens during the acute phase of infection. The lowest concentration of SARS-CoV-2 viral copies this assay can detect is 138 copies/mL. A negative result does not preclude SARS-Cov-2 infection and should not be used as the sole basis for treatment or other patient management decisions. A negative result may occur with  improper specimen collection/handling, submission of specimen other than nasopharyngeal swab, presence of viral mutation(s) within the areas targeted by this assay, and inadequate number of viral copies(<138 copies/mL). A negative result must be combined with clinical observations, patient history, and epidemiological information. The expected result is Negative.  Fact Sheet for Patients:  EntrepreneurPulse.com.au  Fact Sheet for Healthcare Providers:  IncredibleEmployment.be  This test is no t yet approved or cleared by the [place] FDA and  has been authorized for detection and/or diagnosis of SARS-CoV-2 by FDA under an Emergency Use Authorization (EUA). This EUA will remain  in effect (meaning this test can be used) for the duration of the COVID-19 declaration under Section 564(b)(1) of the Act, 21 U.S.C.section 360bbb-3(b)(1), unless the authorization is terminated  or revoked sooner.       Influenza A by PCR NEGATIVE NEGATIVE Final   Influenza B by PCR NEGATIVE NEGATIVE Final    Comment: (NOTE) The Xpert Xpress SARS-CoV-2/FLU/RSV plus assay is intended as an  aid in the diagnosis of influenza from Nasopharyngeal swab specimens and should not be used as a sole basis for treatment. Nasal washings and aspirates are unacceptable for Xpert Xpress SARS-CoV-2/FLU/RSV testing.  Fact Sheet for Patients: EntrepreneurPulse.com.au  Fact Sheet for Healthcare Providers: IncredibleEmployment.be  This test is not yet approved or cleared by the [place] FDA and has been authorized for detection and/or diagnosis of SARS-CoV-2 by FDA under an Emergency Use Authorization (EUA). This EUA will remain in effect (meaning this test can be used) for the duration of the COVID-19 declaration under Section 564(b)(1) of the Act, 21 U.S.C. section 360bbb-3(b)(1), unless the authorization is terminated or revoked.  Performed at [facility], [address] [place], [place] [address]      Radiological Exams on Admission: CT Angio Chest PE W/Cm &/Or Wo Cm  Result Date: 11/18/2022 CLINICAL DATA:  Shortness of breath and cough EXAM: CT ANGIOGRAPHY CHEST WITH CONTRAST TECHNIQUE: Multidetector CT imaging of the chest was performed using the standard protocol during bolus administration of intravenous contrast. Multiplanar CT image reconstructions and MIPs were obtained to evaluate the vascular anatomy. RADIATION DOSE REDUCTION: This exam was performed according to the departmental dose-optimization program which includes automated exposure control, adjustment of the mA and/or kV according to patient size and/or use of iterative reconstruction technique. CONTRAST:  22m OMNIPAQUE IOHEXOL 350 MG/ML SOLN COMPARISON:  CT chest angio dated February 20th 2023 FINDINGS: Cardiovascular: No evidence of pulmonary embolus. Normal heart size. Trace pericardial fluid. Normal caliber thoracic aorta with no significant atherosclerotic disease. Mediastinum/Nodes: Small hiatal hernia. Thyroid is unremarkable. Enlarged subcarinal lymph node  measuring 1.1 cm in short axis on series 4, image 47, likely reactive. Mildly enlarged calcified and left hilar lymph node, unchanged when compared with prior exam and likely sequela of prior granulomatous infection. Lungs/Pleura: Central airways are patent. No consolidation, pleural effusion or pneumothorax. Calcified granuloma of the left lower lobe. Upper Abdomen: Cholecystectomy clips.  No acute abnormality. Musculoskeletal: No chest wall abnormality. No acute or significant osseous findings. Review of the MIP images confirms the above findings. IMPRESSION: No evidence of pulmonary embolus or acute airspace opacity. Electronically Signed  By: Yetta Glassman M.D.   On: 11/18/2022 15:42   DG Chest 2 View  Result Date: 11/18/2022 CLINICAL DATA:  Shortness of breath and chest pain for 2 weeks. EXAM: CHEST - 2 VIEW COMPARISON:  Chest x-ray 02/23/2022 FINDINGS: The cardiac silhouette, mediastinal and hilar contours are within normal limits and stable. The lungs are clear of an acute process. No infiltrates, edema or effusions. Stable calcified granuloma at the left lung base. The bony thorax is intact. Evidence of prior left distal clavicle resection. Mild midthoracic compression deformities are stable. IMPRESSION: No acute cardiopulmonary findings. Electronically Signed   By: Marijo Sanes M.D.   On: 11/18/2022 14:40    EKG: Independently reviewed. Sinus tachycardia, PVC.   Assessment/Plan  1. COPD exacerbation  - Culture sputum, continue systemic steroid, start antibiotic, schedule SAMA-SABA, use additional SABA prn   2. Chest pain  - Intermittent chest pain for 2 wks  - Troponin normal x2, no PE or other acute findings on CTA chest, and no acute ischemic features on EKG  - Continue ASA, statin, beta-blocker   3. HTN  - BP elevated in ED  - Continue diltiazem and propranolol, use labetalol if needed   4. Dementia  - Delirium precautions  - Continue home medications    5. OSA  - CPAP  qHS     DVT prophylaxis: Lovenox  Code Status: Full  Level of Care: Level of care: Telemetry Cardiac Family Communication: none present   Disposition Plan:  Patient is from: home  Anticipated d/c is to: Home  Anticipated d/c date is: Possibly as early as 11/19/22  Patient currently: pending improved respiratory status and transition to oral steroid and antibiotic   Consults called: none  Admission status: Observation     Vianne Bulls, MD [facility]  11/18/2022, 7:53 PM

## 2022-11-18 NOTE — ED Notes (Signed)
New brief placed on pt at this time.

## 2022-11-18 NOTE — ED Provider Triage Note (Signed)
Emergency Medicine Provider Triage Evaluation Note  Rondale Nies , a 81 y.o. male  was evaluated in triage.  Pt complains of CP/SOB, cough x2 weeks. Fever last week. Cough with phlegm today. No swelling in legs. History of DVT. Just had back surgery  Review of Systems  Positive: Cp, sob, cough, fever Negative: Abd pain  Physical Exam  BP (!) 150/78 (BP Location: Left Arm)   Pulse (!) 137   Temp 98.4 F (36.9 C) (Oral)   Resp (!) 22   SpO2 95%  Gen:   Awake, no distress   Resp:  Normal effort  MSK:   Moves extremities without difficulty  Other:    Medical Decision Making  Medically screening exam initiated at 2:13 PM.  Appropriate orders placed.  Nour Scalise was informed that the remainder of the evaluation will be completed by another provider, this initial triage assessment does not replace that evaluation, and the importance of remaining in the ED until their evaluation is complete.     Marquette Old, PA-C 11/18/22 1415

## 2022-11-18 NOTE — ED Provider Notes (Signed)
[facility] Provider Note    Event Date/Time   First MD Initiated Contact with Patient 11/18/22 1630     (approximate)   History   Shortness of Breath   HPI  Brett Blake is a 81 y.o. male with a history of hypertension, type 2 diabetes, COPD, asthma, anemia of chronic disease, sleep apnea, pseudoseizures, and dementia presents with increased shortness of breath over the last several days with generalized weakness and lightheadedness today.  The patient states that earlier he was coughing a lot and feels that he coughed up some phlegm.  His breathing improved somewhat but he still feels weak and lightheaded.  He had back surgery about a week ago which is around when the symptoms started.  He reports some chest discomfort.  He denies vomiting or diarrhea.  I had the past medical records.  The patient was most recent admitted in March.  Per the hospitalist discharge summary from 02/28/2022 he presented with and was treated for alcohol withdrawal.  The patient had a kyphoplasty on 11/16 and had an episode of fever as well as weakness and delirium afterwards.   Physical Exam   Triage Vital Signs: ED Triage Vitals [11/18/22 1407]  Enc Vitals Group     BP (!) 150/78     Pulse Rate (!) 137     Resp (!) 22     Temp 98.4 F (36.9 C)     Temp Source Oral     SpO2 95 %     Weight      Height      Head Circumference      Peak Flow      Pain Score 5     Pain Loc      Pain Edu?      Excl. in [place]?     Most recent vital signs: Vitals:   11/18/22 2200 11/18/22 2230  BP: 138/88 134/84  Pulse: 100 100  Resp: 18 18  Temp:    SpO2: 94% 93%     General: Alert and oriented, somewhat weak appearing but in no acute distress.  CV:  Good peripheral perfusion.  Normal heart sounds. Resp:  Slightly increased respiratory effort.  Diminished breath sounds bilaterally. Abd:  No distention. Other:  No peripheral edema.  Dry mucous membranes.   ED Results /  Procedures / Treatments   Labs (all labs ordered are listed, but only abnormal results are displayed) Labs Reviewed  BASIC METABOLIC PANEL - Abnormal; Notable for the following components:      Result Value   Sodium 134 (*)    Chloride 95 (*)    Glucose, Bld 138 (*)    Calcium 8.3 (*)    All other components within normal limits  CBC - Abnormal; Notable for the following components:   RBC 3.78 (*)    Hemoglobin 12.3 (*)    HCT 36.1 (*)    nRBC 0.4 (*)    All other components within normal limits  D-DIMER, QUANTITATIVE - Abnormal; Notable for the following components:   D-Dimer, Quant 0.94 (*)    All other components within normal limits  RESP PANEL BY RT-PCR (FLU A&B, COVID) [id number]  EXPECTORATED SPUTUM ASSESSMENT W GRAM STAIN, RFLX TO RESP C  BASIC METABOLIC PANEL  MAGNESIUM  CBC  TROPONIN I (HIGH SENSITIVITY)  TROPONIN I (HIGH SENSITIVITY)     EKG  ED ECG REPORT I, Arta Silence, the attending physician, personally viewed and interpreted this ECG.  Date: 11/18/2022 EKG Time: 1411 Rate: 137 Rhythm: Sinus tachycardia QRS Axis: normal Intervals: normal ST/T Wave abnormalities: normal Narrative Interpretation: no evidence of acute ischemia    RADIOLOGY  Chest x-ray: I independently viewed and interpreted the images; there is no focal consolidation or edema  CT angio chest: No acute PE.   PROCEDURES:  Critical Care performed: No  Procedures   MEDICATIONS ORDERED IN ED: Medications  aspirin EC tablet 81 mg (has no administration in time range)  diltiazem (CARDIZEM CD) 24 hr capsule 240 mg (has no administration in time range)  propranolol (INDERAL) tablet 10 mg (10 mg Oral Given 11/18/22 2122)  rosuvastatin (CRESTOR) tablet 5 mg (has no administration in time range)  donepezil (ARICEPT) tablet 5 mg (5 mg Oral Given 11/18/22 2122)  QUEtiapine (SEROQUEL) tablet 25 mg (25 mg Oral Given 11/18/22 2122)  venlafaxine XR (EFFEXOR-XR) 24 hr capsule 37.5 mg  (has no administration in time range)  pantoprazole (PROTONIX) EC tablet 40 mg (has no administration in time range)  melatonin tablet 5 mg (5 mg Oral Given 11/18/22 2122)  gabapentin (NEURONTIN) capsule 300 mg (300 mg Oral Given 11/18/22 2122)  enoxaparin (LOVENOX) injection 40 mg (40 mg Subcutaneous Given 11/18/22 2122)  sodium chloride flush (NS) 0.9 % injection 3 mL (3 mLs Intravenous Not Given 11/18/22 2129)  acetaminophen (TYLENOL) tablet 650 mg (has no administration in time range)    Or  acetaminophen (TYLENOL) suppository 650 mg (has no administration in time range)  oxyCODONE (Oxy IR/ROXICODONE) immediate release tablet 2.5 mg (has no administration in time range)  polyethylene glycol (MIRALAX / GLYCOLAX) packet 17 g (has no administration in time range)  ondansetron (ZOFRAN) tablet 4 mg (has no administration in time range)    Or  ondansetron (ZOFRAN) injection 4 mg (has no administration in time range)  0.9 %  sodium chloride infusion ( Intravenous New Bag/Given 11/18/22 2016)  azithromycin (ZITHROMAX) 500 mg in sodium chloride 0.9 % 250 mL IVPB (0 mg Intravenous Stopped 11/18/22 2129)    Followed by  azithromycin (ZITHROMAX) tablet 500 mg (has no administration in time range)  methylPREDNISolone sodium succinate (SOLU-MEDROL) 125 mg/2 mL injection 125 mg (has no administration in time range)    Followed by  predniSONE (DELTASONE) tablet 40 mg (has no administration in time range)  ipratropium-albuterol (DUONEB) 0.5-2.5 (3) MG/3ML nebulizer solution 3 mL (3 mLs Nebulization Given 11/18/22 2013)  albuterol (PROVENTIL) (2.5 MG/3ML) 0.083% nebulizer solution 2.5 mg (has no administration in time range)  labetalol (NORMODYNE) injection 10 mg (has no administration in time range)  iohexol (OMNIPAQUE) 350 MG/ML injection 75 mL (75 mLs Intravenous Contrast Given 11/18/22 1524)  sodium chloride 0.9 % bolus 1,000 mL (0 mLs Intravenous Stopped 11/18/22 1903)  ipratropium-albuterol  (DUONEB) 0.5-2.5 (3) MG/3ML nebulizer solution 3 mL (3 mLs Nebulization Given 11/18/22 1655)  methylPREDNISolone sodium succinate (SOLU-MEDROL) 125 mg/2 mL injection 125 mg (125 mg Intravenous Given 11/18/22 1657)     IMPRESSION / MDM / ASSESSMENT AND PLAN / ED COURSE  I reviewed the triage vital signs and the nursing notes.  81 year old male with PMH as noted above presents with increased shortness of breath, some chest discomfort, lightheadedness, and palpitations.  On exam his heart rate is elevated to the 120s.  His vital signs are otherwise normal.  Lungs reveal diminished breath sounds.  There is no peripheral edema.  Differential diagnosis includes, but is not limited to, acute bronchitis, pneumonia, COVID, other acute infection/sepsis, dehydration, electrolyte abnormality, other metabolic disturbance,  less likely acute CHF or other cardiac etiology.  Initial lab work-up reveals a normal troponin and no leukocytosis.  D-dimer is elevated.  Chest x-ray showed no acute findings.  CT angio shows no evidence of PE and also no evidence of pneumonia or pulmonary edema.  Therefore work-up so far is overall most consistent with COPD exacerbation, viral syndrome, and/or dehydration.  We will give fluids, steroid, bronchodilators, and reassess.  Patient's presentation is most consistent with acute presentation with potential threat to life or bodily function.  The patient is on the cardiac monitor to evaluate for evidence of arrhythmia and/or significant heart rate changes.   ----------------------------------------- 7:28 PM on 11/18/2022 -----------------------------------------  The patient remains tachycardic and shaky.  He is likely still dehydrated.  He will need additional treatment and monitoring in the hospital.  I consulted Dr. Myna Hidalgo; based on our discussion he agrees to admit the patient.  FINAL CLINICAL IMPRESSION(S) / ED DIAGNOSES   Final diagnoses:  COPD exacerbation ([place])   Tachycardia     Rx / DC Orders   ED Discharge Orders     None        Note:  This document was prepared using Dragon voice recognition software and may include unintentional dictation errors.    Arta Silence, MD 11/18/22 2314

## 2022-11-19 DIAGNOSIS — E1169 Type 2 diabetes mellitus with other specified complication: Secondary | ICD-10-CM | POA: Diagnosis not present

## 2022-11-19 DIAGNOSIS — I10 Essential (primary) hypertension: Secondary | ICD-10-CM | POA: Diagnosis not present

## 2022-11-19 DIAGNOSIS — J441 Chronic obstructive pulmonary disease with (acute) exacerbation: Secondary | ICD-10-CM | POA: Diagnosis not present

## 2022-11-19 LAB — BASIC METABOLIC PANEL
Anion gap: 15 (ref 5–15)
BUN: 16 mg/dL (ref 8–23)
CO2: 23 mmol/L (ref 22–32)
Calcium: 7.2 mg/dL — ABNORMAL LOW (ref 8.9–10.3)
Chloride: 96 mmol/L — ABNORMAL LOW (ref 98–111)
Creatinine, Ser: 0.89 mg/dL (ref 0.61–1.24)
GFR, Estimated: >60 mL/min (ref >60)
Glucose, Bld: 224 mg/dL — ABNORMAL HIGH (ref 70–99)
Potassium: 3.6 mmol/L (ref 3.5–5.1)
Sodium: 134 mmol/L — ABNORMAL LOW (ref 135–145)

## 2022-11-19 LAB — CBC
HCT: 28 % — ABNORMAL LOW (ref 39.0–52.0)
Hemoglobin: 9.5 g/dL — ABNORMAL LOW (ref 13.0–17.0)
MCH: 32.6 pg (ref 26.0–34.0)
MCHC: 33.9 g/dL (ref 30.0–36.0)
MCV: 96.2 fL (ref 80.0–100.0)
Platelets: 264 10*3/uL (ref 150–400)
RBC: 2.91 MIL/uL — ABNORMAL LOW (ref 4.22–5.81)
RDW: 13.9 % (ref 11.5–15.5)
WBC: 3 10*3/uL — ABNORMAL LOW (ref 4.0–10.5)
nRBC: 0 % (ref 0.0–0.2)

## 2022-11-19 LAB — CBG MONITORING, ED: Glucose-Capillary: 351 mg/dL — ABNORMAL HIGH (ref 70–99)

## 2022-11-19 LAB — GLUCOSE, CAPILLARY: Glucose-Capillary: 346 mg/dL — ABNORMAL HIGH (ref 70–99)

## 2022-11-19 LAB — MAGNESIUM: Magnesium: 1.7 mg/dL (ref 1.7–2.4)

## 2022-11-19 MED ORDER — INSULIN ASPART 100 UNIT/ML IJ SOLN
0.0000 [IU] | Freq: Three times a day (TID) | INTRAMUSCULAR | Status: DC
  Administered 2022-11-19: 4 [IU] via SUBCUTANEOUS
  Administered 2022-11-20: 3 [IU] via SUBCUTANEOUS
  Filled 2022-11-19 (×2): qty 1

## 2022-11-19 MED ORDER — INSULIN ASPART 100 UNIT/ML IJ SOLN
4.0000 [IU] | Freq: Once | INTRAMUSCULAR | Status: AC
  Administered 2022-11-20: 4 [IU] via SUBCUTANEOUS
  Filled 2022-11-19: qty 1

## 2022-11-19 NOTE — Progress Notes (Signed)
PROGRESS NOTE   HPI was taken from Dr. Myna Hidalgo: Brett Blake is a 81 y.o. male with medical history significant for COPD, OSA, hypertension, hyperlipidemia, dementia, and L2 fracture status post recent kyphoplasty presents to the emergency department with worsening cough, shortness of breath, and intermittent chest discomfort.   Patient reports that his chronic dyspnea and chronic cough began to worsen approximately 2 weeks ago, and he also began to intermittent chest discomfort he had a kyphoplasty little over a week ago, has had a loss of appetite since then, and his cough and shortness of breath has continued to worsen.  He denies any fever, chills, or leg swelling associated with this.  He is unable to identify any precipitating or alleviating factors for his chest discomfort.   ED Course: Upon arrival to the ED, patient is found to be afebrile and saturating well on room air with tachypnea, tachycardia, and elevated blood pressure.  EKG demonstrates sinus tachycardia with rate 137 and PVC.  Chest x-ray negative for acute cardiopulmonary disease.  Blood work notable for D-dimer 0.94 and normal troponin x 2.  COVID and influenza were negative.  CTA chest was negative for PE or acute airspace disease.   Patient was treated with a liter of normal saline, 125 mg IV Solu-Medrol, and DuoNeb in the ED.   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 Likely secondary to above. CTA chest neg for PE. No ischemic changes on EKG  DM2: HbA1c 6.9 approx 3 months ago. Not on any anti-DM  meds. Will start SSI w/ accuchecks   HTN: continue on propranolol, diltiazem  Dementia: continue on home dose of donepezil   OSA: CPAP qhs   Normocytic anemia: H&H are labile. No need for a transfusion currently      DVT prophylaxis: lovenox  Code Status: full  Family Communication:  Disposition Plan: likely d/c back home   Level of care: Telemetry Cardiac  Status is: Observation The patient remains OBS appropriate and will d/c before 2 midnights.    Consultants:    Procedures:   Antimicrobials: azithromycin    Subjective: Pt c/o shortness of breath   Objective: Vitals:   11/19/22 0130 11/19/22 0300 11/19/22 0330 11/19/22 0400  BP: 130/86 (!) 138/97 (!) 159/89 (!) 141/90  Pulse: 88 91 87 88  Resp: '13  17 15  '$ Temp:      TempSrc:      SpO2: 95% 92% 95% 93%    Intake/Output Summary (Last 24 hours) at 11/19/2022 0842 Last data filed at 11/19/2022 9937 Gross per 24 hour  Intake 900 ml  Output --  Net 900 ml   There were no vitals filed for this visit.  Examination:  General exam: Appears calm and comfortable  Respiratory system: diminished breath sounds b/l  Cardiovascular system: S1 & S2 +. No, rubs, gallops or clicks.  Gastrointestinal system: Abdomen is nondistended, soft and nontender. Normal bowel sounds heard. Central nervous system: Alert and oriented.  Moves all extremities  Psychiatry: Judgement and insight appear normal. Flat mood and affect    Data Reviewed: I have personally reviewed  following labs and imaging studies  CBC: Recent Labs  Lab 11/18/22 1415 11/19/22 0421  WBC 5.2 3.0*  HGB 12.3* 9.5*  HCT 36.1* 28.0*  MCV 95.5 96.2  PLT 340 161   Basic Metabolic Panel: Recent Labs  Lab 11/18/22 1415 11/19/22 0421  NA 134* 134*  K 3.7 3.6  CL 95* 96*  CO2 25 23  GLUCOSE 138* 224*  BUN 15 16  CREATININE 1.14 0.89  CALCIUM 8.3* 7.2*  MG  --  1.7   GFR: CrCl cannot be calculated (Unknown ideal weight.). Liver Function  Tests: No results for input(s): "AST", "ALT", "ALKPHOS", "BILITOT", "PROT", "ALBUMIN" in the last 168 hours. No results for input(s): "LIPASE", "AMYLASE" in the last 168 hours. No results for input(s): "AMMONIA" in the last 168 hours. Coagulation Profile: No results for input(s): "INR", "PROTIME" in the last 168 hours. Cardiac Enzymes: No results for input(s): "CKTOTAL", "CKMB", "CKMBINDEX", "TROPONINI" in the last 168 hours. BNP (last 3 results) No results for input(s): "PROBNP" in the last 8760 hours. HbA1C: No results for input(s): "HGBA1C" in the last 72 hours. CBG: No results for input(s): "GLUCAP" in the last 168 hours. Lipid Profile: No results for input(s): "CHOL", "HDL", "LDLCALC", "TRIG", "CHOLHDL", "LDLDIRECT" in the last 72 hours. Thyroid Function Tests: No results for input(s): "TSH", "T4TOTAL", "FREET4", "T3FREE", "THYROIDAB" in the last 72 hours. Anemia Panel: No results for input(s): "VITAMINB12", "FOLATE", "FERRITIN", "TIBC", "IRON", "RETICCTPCT" in the last 72 hours. Sepsis Labs: No results for input(s): "PROCALCITON", "LATICACIDVEN" in the last 168 hours.  Recent Results (from the past 240 hour(s))  Resp Panel by RT-PCR (Flu A&B, Covid) Anterior Nasal Swab     Status: None   Collection Time: 11/18/22  4:48 PM   Specimen: Anterior Nasal Swab  Result Value Ref Range Status   SARS Coronavirus 2 by RT PCR NEGATIVE NEGATIVE Final    Comment: (NOTE) SARS-CoV-2 target nucleic acids are NOT DETECTED.  The SARS-CoV-2 RNA is generally detectable in upper respiratory specimens during the acute phase of infection. The lowest concentration of SARS-CoV-2 viral copies this assay can detect is 138 copies/mL. A negative result does not preclude SARS-Cov-2 infection and should not be used as the sole basis for treatment or other patient management decisions. A negative result may occur with  improper specimen collection/handling, submission of specimen other than  nasopharyngeal swab, presence of viral mutation(s) within the areas targeted by this assay, and inadequate number of viral copies(<138 copies/mL). A negative result must be combined with clinical observations, patient history, and epidemiological information. The expected result is Negative.  Fact Sheet for Patients:  EntrepreneurPulse.com.au  Fact Sheet for Healthcare Providers:  IncredibleEmployment.be  This test is no t yet approved or cleared by the [place] FDA and  has been authorized for detection and/or diagnosis of SARS-CoV-2 by FDA under an Emergency Use Authorization (EUA). This EUA will remain  in effect (meaning this test can be used) for the duration of the COVID-19 declaration under Section 564(b)(1) of the Act, 21 U.S.C.section 360bbb-3(b)(1), unless the authorization is terminated  or revoked sooner.       Influenza A by PCR NEGATIVE NEGATIVE Final   Influenza B by PCR NEGATIVE NEGATIVE Final    Comment: (NOTE) The Xpert Xpress SARS-CoV-2/FLU/RSV plus assay is intended as an aid in the diagnosis of influenza from Nasopharyngeal swab specimens and should not be used as a sole basis for treatment. Nasal washings and aspirates are unacceptable for Xpert Xpress SARS-CoV-2/FLU/RSV testing.  Fact  Sheet for Patients: EntrepreneurPulse.com.au  Fact Sheet for Healthcare Providers: IncredibleEmployment.be  This test is not yet approved or cleared by the [place] FDA and has been authorized for detection and/or diagnosis of SARS-CoV-2 by FDA under an Emergency Use Authorization (EUA). This EUA will remain in effect (meaning this test can be used) for the duration of the COVID-19 declaration under Section 564(b)(1) of the Act, 21 U.S.C. section 360bbb-3(b)(1), unless the authorization is terminated or revoked.  Performed at [facility], [address] [place], [place]  [address]          Radiology Studies: CT Angio Chest PE W/Cm &/Or Wo Cm  Result Date: 11/18/2022 CLINICAL DATA:  Shortness of breath and cough EXAM: CT ANGIOGRAPHY CHEST WITH CONTRAST TECHNIQUE: Multidetector CT imaging of the chest was performed using the standard protocol during bolus administration of intravenous contrast. Multiplanar CT image reconstructions and MIPs were obtained to evaluate the vascular anatomy. RADIATION DOSE REDUCTION: This exam was performed according to the departmental dose-optimization program which includes automated exposure control, adjustment of the mA and/or kV according to patient size and/or use of iterative reconstruction technique. CONTRAST:  60m OMNIPAQUE IOHEXOL 350 MG/ML SOLN COMPARISON:  CT chest angio dated February 20th 2023 FINDINGS: Cardiovascular: No evidence of pulmonary embolus. Normal heart size. Trace pericardial fluid. Normal caliber thoracic aorta with no significant atherosclerotic disease. Mediastinum/Nodes: Small hiatal hernia. Thyroid is unremarkable. Enlarged subcarinal lymph node measuring 1.1 cm in short axis on series 4, image 47, likely reactive. Mildly enlarged calcified and left hilar lymph node, unchanged when compared with prior exam and likely sequela of prior granulomatous infection. Lungs/Pleura: Central airways are patent. No consolidation, pleural effusion or pneumothorax. Calcified granuloma of the left lower lobe. Upper Abdomen: Cholecystectomy clips.  No acute abnormality. Musculoskeletal: No chest wall abnormality. No acute or significant osseous findings. Review of the MIP images confirms the above findings. IMPRESSION: No evidence of pulmonary embolus or acute airspace opacity. Electronically Signed   By: LYetta GlassmanM.D.   On: 11/18/2022 15:42   DG Chest 2 View  Result Date: 11/18/2022 CLINICAL DATA:  Shortness of breath and chest pain for 2 weeks. EXAM: CHEST - 2 VIEW COMPARISON:  Chest x-ray 02/23/2022 FINDINGS: The  cardiac silhouette, mediastinal and hilar contours are within normal limits and stable. The lungs are clear of an acute process. No infiltrates, edema or effusions. Stable calcified granuloma at the left lung base. The bony thorax is intact. Evidence of prior left distal clavicle resection. Mild midthoracic compression deformities are stable. IMPRESSION: No acute cardiopulmonary findings. Electronically Signed   By: PMarijo SanesM.D.   On: 11/18/2022 14:40        Scheduled Meds:  aspirin EC  81 mg Oral Daily   azithromycin  500 mg Oral Daily   diltiazem  240 mg Oral q morning   donepezil  5 mg Oral QHS   enoxaparin (LOVENOX) injection  40 mg Subcutaneous Q24H   gabapentin  300 mg Oral QHS   ipratropium-albuterol  3 mL Nebulization Q6H   melatonin  5 mg Oral QHS   methylPREDNISolone (SOLU-MEDROL) injection  125 mg Intravenous Q12H   Followed by   [Derrill MemoON 11/20/2022] predniSONE  40 mg Oral Q breakfast   pantoprazole  40 mg Oral Daily   propranolol  10 mg Oral BID   QUEtiapine  25 mg Oral QHS   rosuvastatin  5 mg Oral Daily   sodium chloride flush  3 mL Intravenous Q12H  venlafaxine XR  37.5 mg Oral Daily   Continuous Infusions:   LOS: 0 days    Time spent: 35 mins     Wyvonnia Dusky, MD [facility] Pager [phone]  If 7PM-7AM, please contact night-coverage [email or web address] 11/19/2022, 8:42 AM

## 2022-11-20 DIAGNOSIS — D638 Anemia in other chronic diseases classified elsewhere: Secondary | ICD-10-CM

## 2022-11-20 DIAGNOSIS — J441 Chronic obstructive pulmonary disease with (acute) exacerbation: Secondary | ICD-10-CM | POA: Diagnosis not present

## 2022-11-20 DIAGNOSIS — E785 Hyperlipidemia, unspecified: Secondary | ICD-10-CM

## 2022-11-20 DIAGNOSIS — R072 Precordial pain: Secondary | ICD-10-CM | POA: Diagnosis not present

## 2022-11-20 DIAGNOSIS — I10 Essential (primary) hypertension: Secondary | ICD-10-CM | POA: Diagnosis not present

## 2022-11-20 LAB — BASIC METABOLIC PANEL
Anion gap: 8 (ref 5–15)
BUN: 23 mg/dL (ref 8–23)
CO2: 25 mmol/L (ref 22–32)
Calcium: 8.1 mg/dL — ABNORMAL LOW (ref 8.9–10.3)
Chloride: 103 mmol/L (ref 98–111)
Creatinine, Ser: 1.03 mg/dL (ref 0.61–1.24)
GFR, Estimated: >60 mL/min (ref >60)
Glucose, Bld: 308 mg/dL — ABNORMAL HIGH (ref 70–99)
Potassium: 3.7 mmol/L (ref 3.5–5.1)
Sodium: 136 mmol/L (ref 135–145)

## 2022-11-20 LAB — CBC
HCT: 27.3 % — ABNORMAL LOW (ref 39.0–52.0)
Hemoglobin: 9.3 g/dL — ABNORMAL LOW (ref 13.0–17.0)
MCH: 32.3 pg (ref 26.0–34.0)
MCHC: 34.1 g/dL (ref 30.0–36.0)
MCV: 94.8 fL (ref 80.0–100.0)
Platelets: 305 10*3/uL (ref 150–400)
RBC: 2.88 MIL/uL — ABNORMAL LOW (ref 4.22–5.81)
RDW: 13.6 % (ref 11.5–15.5)
WBC: 4.8 10*3/uL (ref 4.0–10.5)
nRBC: 0 % (ref 0.0–0.2)

## 2022-11-20 LAB — GLUCOSE, CAPILLARY: Glucose-Capillary: 275 mg/dL — ABNORMAL HIGH (ref 70–99)

## 2022-11-20 MED ORDER — AZITHROMYCIN 250 MG PO TABS: ORAL_TABLET | ORAL | 0 refills | Status: AC

## 2022-11-20 MED ORDER — ALBUTEROL SULFATE 1.25 MG/3ML IN NEBU: 3.0000 mL | INHALATION_SOLUTION | Freq: Four times a day (QID) | RESPIRATORY_TRACT | 12 refills | Status: AC | PRN

## 2022-11-20 MED ORDER — PREDNISONE 20 MG PO TABS: 40.0000 mg | ORAL_TABLET | Freq: Every day | ORAL | 0 refills | Status: AC

## 2022-11-20 NOTE — Assessment & Plan Note (Signed)
Continue propranolol and diltiazem

## 2022-11-20 NOTE — Assessment & Plan Note (Signed)
From COPD exacerbation.  2 troponins were negative.

## 2022-11-20 NOTE — Progress Notes (Signed)
AVS given and reviewed with pt. Medications discussed. All questions answered to satisfaction. Pt verbalized understanding of information given. Pt to be escorted off the unit with all belongings via wheelchair by staff member.  

## 2022-11-20 NOTE — Plan of Care (Signed)
  Problem: Pain Managment: Goal: General experience of comfort will improve Outcome: Progressing   Problem: Safety: Goal: Ability to remain free from injury will improve Outcome: Progressing   Problem: Skin Integrity: Goal: Risk for impaired skin integrity will decrease Outcome: Progressing   

## 2022-11-20 NOTE — Care Management Obs Status (Signed)
[place] NOTIFICATION   Patient Details  Name: Brett Blake MRN: [id number] Date of Birth: [birth year]   Medicare Observation Status Notification Given:  Yes    [name] [name], LCSW 11/20/2022, 9:53 AM

## 2022-11-20 NOTE — Assessment & Plan Note (Signed)
Earlier in the year a ferritin was 404.  Last hemoglobin 9.3.  Patient likely dehydrated on coming into the hospital.  Recommend checking another hemoglobin as outpatient.  Patient did have a documented brown bowel movement in the hospital.

## 2022-11-20 NOTE — Assessment & Plan Note (Signed)
On low-dose Aricept

## 2022-11-20 NOTE — Assessment & Plan Note (Signed)
Continue Crestor.  Last hemoglobin A1c as outpatient was 6.9.  Continue diet control.  Sugars will be higher for the next couple days while on steroids.

## 2022-11-20 NOTE — Assessment & Plan Note (Signed)
Patient treated with nebulizer treatments and azithromycin and Solu-Medrol here in the hospital.  Switch over to prednisone and Zithromax orally for few more days upon discharge.

## 2022-11-20 NOTE — Discharge Summary (Signed)
Physician Discharge Summary   Patient: Brett Blake MRN: [id number] DOB: 1941-03-16  Admit date:     11/18/2022  Discharge date: 11/20/22  Discharge Physician: Loletha Grayer   PCP: [name], MD   Recommendations at discharge:   Follow-up PCP 5 days  Discharge Diagnoses: Principal Problem:   COPD exacerbation ([place]) Active Problems:   Essential hypertension   Anemia of chronic disease   Type 2 diabetes mellitus with hyperlipidemia (HCC)   Chest pain   Sleep apnea   Mild Alzheimer's dementia [facility])    Hospital Course: The patient was admitted as an observation on 11/18/2022 and discharged on 11/20/2022.  Came in for COPD exacerbation.  CT scan of the chest was negative for pulmonary embolism or airspace opacity.  Chest x-ray was negative.  Patient was given Solu-Medrol and Zithromax.  Patient improved.  Lungs are clear upon discharge.  Assessment and Plan: * COPD exacerbation ([place]) Patient treated with nebulizer treatments and azithromycin and Solu-Medrol here in the hospital.  Switch over to prednisone and Zithromax orally for few more days upon discharge.  Essential hypertension Continue propranolol and diltiazem  Anemia of chronic disease Earlier in the year a ferritin was 404.  Last hemoglobin 9.3.  Patient likely dehydrated on coming into the hospital.  Recommend checking another hemoglobin as outpatient.  Patient did have a documented brown bowel movement in the hospital.  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[place]) On low-dose Aricept  Chest pain From COPD exacerbation.  2 troponins were negative.  Type 2 diabetes mellitus with hyperlipidemia (HCC) Continue Crestor.  Last hemoglobin A1c as outpatient was 6.9.  Continue diet control.  Sugars will be higher for the next couple days while on steroids.         Consultants: None Procedures performed: None Disposition: Home Diet recommendation:  Cardiac and Carb modified diet DISCHARGE MEDICATION: Allergies  as of 11/20/2022       Reactions   Ace Inhibitors Cough   Metoprolol Diarrhea   Patient has been tolerating Propranolol 40 mg twice daily since at least 05/2022.   Nsaids Rash   Sulfa Antibiotics Rash        Medication List     STOP taking these medications    benzonatate 200 MG capsule Commonly known as: TESSALON   isosorbide mononitrate 30 MG 24 hr tablet Commonly known as: IMDUR   montelukast 10 MG tablet Commonly known as: SINGULAIR   MULTIVITAMIN PO   pantoprazole 40 MG tablet Commonly known as: PROTONIX   thiamine 100 MG tablet Commonly known as: VITAMIN B1   vancomycin 125 MG capsule Commonly known as: VANCOCIN       TAKE these medications    albuterol 1.25 MG/3ML nebulizer solution Commonly known as: ACCUNEB Take 3 mLs by nebulization every 6 (six) hours as needed for wheezing. What changed: See the new instructions.   ALPRAZolam 0.5 MG tablet Commonly known as: XANAX Take 0.5 mg by mouth daily as needed for anxiety.   aspirin EC 81 MG tablet Take 81 mg by mouth daily. Swallow whole.   azithromycin 250 MG tablet Commonly known as: ZITHROMAX One tab po daily for three days Start taking on: November 21, 2022   diltiazem 240 MG 24 hr capsule Commonly known as: CARDIZEM CD Take 240 mg by mouth every morning.   donepezil 5 MG tablet Commonly known as: ARICEPT Take 5 mg by mouth at bedtime.   fluticasone 50 MCG/ACT nasal spray Commonly known as: FLONASE Place 1 spray into both  nostrils daily as needed for allergies or rhinitis.   folic acid 1 MG tablet Commonly known as: FOLVITE Take 1 tablet (1 mg total) by mouth daily.   gabapentin 300 MG capsule Commonly known as: NEURONTIN Take 1 capsule (300 mg total) by mouth at bedtime.   guaiFENesin 100 MG/5ML liquid Commonly known as: ROBITUSSIN Take 5 mLs by mouth every 4 (four) hours as needed for cough or to loosen phlegm.   melatonin 5 MG Tabs Take 1 tablet (5 mg total) by mouth at  bedtime.   nitroGLYCERIN 0.4 MG SL tablet Commonly known as: NITROSTAT Place 0.4 mg under the tongue every 5 (five) minutes as needed for chest pain.   omeprazole 40 MG capsule Commonly known as: PRILOSEC Take 40 mg by mouth daily.   ondansetron 4 MG disintegrating tablet Commonly known as: ZOFRAN-ODT Take 1 tablet (4 mg total) by mouth every 8 (eight) hours as needed for nausea or vomiting.   polyethylene glycol 17 g packet Commonly known as: MIRALAX / GLYCOLAX Take 17 g by mouth daily as needed for moderate constipation.   predniSONE 20 MG tablet Commonly known as: DELTASONE Take 2 tablets (40 mg total) by mouth daily with breakfast for 3 days. Start taking on: November 21, 2022   propranolol 40 MG tablet Commonly known as: INDERAL Take 40 mg by mouth 2 (two) times daily.   QUEtiapine 25 MG tablet Commonly known as: SEROquel One tab po qhs (can take extra dose if not sleeping)   rosuvastatin 5 MG tablet Commonly known as: CRESTOR Take 5 mg by mouth daily.   Symbicort 80-4.5 MCG/ACT inhaler Generic drug: budesonide-formoterol Inhale 2 puffs into the lungs 2 (two) times daily as needed.   venlafaxine XR 37.5 MG 24 hr capsule Commonly known as: EFFEXOR-XR Take 37.5 mg by mouth daily.        Follow-up Information     [name], MD Follow up in 5 day(s).   Specialty: Internal Medicine Contact information: [place] [address] [phone]                Discharge Exam: Physical Exam HENT:     Head: Normocephalic.     Mouth/Throat:     Pharynx: No oropharyngeal exudate.  Eyes:     General: Lids are normal.     Conjunctiva/sclera: Conjunctivae normal.  Cardiovascular:     Rate and Rhythm: Normal rate and regular rhythm.     Heart sounds: Normal heart sounds, S1 normal and S2 normal.  Pulmonary:     Breath sounds: Examination of the right-lower field reveals decreased breath sounds. Examination of the left-lower field  reveals decreased breath sounds. Decreased breath sounds present. No wheezing, rhonchi or rales.  Abdominal:     Palpations: Abdomen is soft.     Tenderness: There is no abdominal tenderness.  Musculoskeletal:     Right lower leg: No swelling.     Left lower leg: No swelling.  Skin:    General: Skin is warm.     Findings: No rash.  Neurological:     Mental Status: He is alert and oriented to person, place, and time.      Condition at discharge: stable  The results of significant diagnostics from this hospitalization (including imaging, microbiology, ancillary and laboratory) are listed below for reference.   Imaging Studies: CT Angio Chest PE W/Cm &/Or Wo Cm  Result Date: 11/18/2022 CLINICAL DATA:  Shortness of breath and cough EXAM: CT ANGIOGRAPHY  CHEST WITH CONTRAST TECHNIQUE: Multidetector CT imaging of the chest was performed using the standard protocol during bolus administration of intravenous contrast. Multiplanar CT image reconstructions and MIPs were obtained to evaluate the vascular anatomy. RADIATION DOSE REDUCTION: This exam was performed according to the departmental dose-optimization program which includes automated exposure control, adjustment of the mA and/or kV according to patient size and/or use of iterative reconstruction technique. CONTRAST:  31m OMNIPAQUE IOHEXOL 350 MG/ML SOLN COMPARISON:  CT chest angio dated February 20th 2023 FINDINGS: Cardiovascular: No evidence of pulmonary embolus. Normal heart size. Trace pericardial fluid. Normal caliber thoracic aorta with no significant atherosclerotic disease. Mediastinum/Nodes: Small hiatal hernia. Thyroid is unremarkable. Enlarged subcarinal lymph node measuring 1.1 cm in short axis on series 4, image 47, likely reactive. Mildly enlarged calcified and left hilar lymph node, unchanged when compared with prior exam and likely sequela of prior granulomatous infection. Lungs/Pleura: Central airways are patent. No consolidation,  pleural effusion or pneumothorax. Calcified granuloma of the left lower lobe. Upper Abdomen: Cholecystectomy clips.  No acute abnormality. Musculoskeletal: No chest wall abnormality. No acute or significant osseous findings. Review of the MIP images confirms the above findings. IMPRESSION: No evidence of pulmonary embolus or acute airspace opacity. Electronically Signed   By: LYetta GlassmanM.D.   On: 11/18/2022 15:42   DG Chest 2 View  Result Date: 11/18/2022 CLINICAL DATA:  Shortness of breath and chest pain for 2 weeks. EXAM: CHEST - 2 VIEW COMPARISON:  Chest x-ray 02/23/2022 FINDINGS: The cardiac silhouette, mediastinal and hilar contours are within normal limits and stable. The lungs are clear of an acute process. No infiltrates, edema or effusions. Stable calcified granuloma at the left lung base. The bony thorax is intact. Evidence of prior left distal clavicle resection. Mild midthoracic compression deformities are stable. IMPRESSION: No acute cardiopulmonary findings. Electronically Signed   By: PMarijo SanesM.D.   On: 11/18/2022 14:40   IR KYPHO LUMBAR INC FX REDUCE BONE BX UNI/BIL CANNULATION INC/IMAGING  Result Date: 11/10/2022 INDICATION: [age]year old gentleman with traumatic L2 compression fracture status post fall and continued back pain presents to IR for kyphoplasty EXAM: L2 kyphoplasty COMPARISON:  None Available. MEDICATIONS: As antibiotic prophylaxis, Ancef 2 g IV was ordered pre-procedure and administered intravenously within 1 hour of incision. All current medications are in the EMR and have been reviewed as part of this encounter. ANESTHESIA/SEDATION: Moderate (conscious) sedation was employed during this procedure. A total of Versed 2 mg and Fentanyl 50 mcg was administered intravenously by the radiology nurse. Total intra-service moderate Sedation Time: 24 minutes. The patient's level of consciousness and vital signs were monitored continuously by radiology nursing throughout the  procedure under my direct supervision. FLUOROSCOPY: Radiation Exposure Index (as provided by the fluoroscopic device): 58 mGy Kerma COMPLICATIONS: None immediate. PROCEDURE: Following a full explanation of the procedure along with the potential associated complications, an informed witnessed consent was obtained. Patient positioned prone on the angiography table. Lower back skin prepped and draped in usual sterile fashion. All elements of maximal sterile barrier were utilized including, cap, mask, sterile gown, sterile gloves, large sterile drape, hand scrubbing and 2% Chlorhexidine for skin cleaning. The L2 vertebral body was localized under fluoroscopy. Intermittent fluoroscopy was used for guidance throughout the procedure. Multiple spot films of the lumbar spine were also obtained in the AP and lateral projections during the procedure. Right transpedicular cannulation of the vertebral body was performed with 10 ga Osteo-introducer needle. Fluoroscopy showed satisfactory positioning of equipment within the mildly  compressed vertebral body with no displaced fracture fragments. Partial fracture reduction and bony cavity creation was then performed using an inflatable bone tamp. The bone tamp was removed, and vertebral augmentation was performed to careful application of 5 mL of firm consistency bone cement under lateral fluoroscopic guidance. Given symmetric vertebral body fill, contralateral transpedicular approach was not performed. The cannula was removed and hemostasis achieved with manual compression. IMPRESSION: L2 kyphoplasty. if the patient has known osteoporosis, recommend treatment as clinically indicated. If the patient's bone density status is unknown, DEXA scan is recommended. Electronically Signed   By: Miachel Roux M.D.   On: 11/10/2022 16:42   IR Radiologist Eval & Mgmt  Result Date: 11/08/2022 EXAM: NEW PATIENT OFFICE VISIT CHIEF COMPLAINT: SEE EPIC NOTE HISTORY OF PRESENT ILLNESS: SEE EPIC  NOTE REVIEW OF SYSTEMS: SEE EPIC NOTE PHYSICAL EXAMINATION: SEE EPIC NOTE ASSESSMENT AND PLAN: SEE EPIC NOTE Electronically Signed   By: Jacqulynn Cadet M.D.   On: 11/08/2022 13:47   MR LUMBAR SPINE WO CONTRAST  Result Date: 11/02/2022 CLINICAL DATA:  Closed compression fracture of L2 lumbar vertebra with delayed healing, subsequent encounter. EXAM: MRI LUMBAR SPINE WITHOUT CONTRAST TECHNIQUE: Multiplanar, multisequence MR imaging of the lumbar spine was performed. No intravenous contrast was administered. COMPARISON:  CT lumbar spine October 30, 2022. FINDINGS: Segmentation:  Choose Alignment: 1 grade 1 anterolisthesis of L4 over L5. Small retrolisthesis of L5 over S1. Vertebrae: Compression fracture of the superior endplate of L2 resulting in approximately 25% height loss compared to 20% on prior CT. There is associated marrow edema, consistent with subacute process. No significant retropulsion. No evidence of discitis or aggressive bone lesion. Endplate degenerative changes at L4-5 and L5-S1. Conus medullaris and cauda equina: Conus extends to the L1 level. Conus and cauda equina appear normal. Paraspinal and other soft tissues: Negative. Disc levels: T12-L1: No spinal canal or neural foraminal stenosis. L1-2: Shallow disc bulge and mild facet degenerative changes without significant spinal canal or neural foraminal stenosis. L2-3: Shallow disc bulge and mild facet degenerative change without significant spinal canal or neural foraminal stenosis. L3-4: Shallow disc bulge and mild facet degenerative changes without significant spinal canal or neural foraminal stenosis. L4-5: Loss of disc height, right asymmetric disc bulge with associated osteophytic component, advanced right and mild left facet degenerative changes with trace joint effusion and mild epidural lipomatosis. Findings result in mild spinal canal stenosis with moderate narrowing of the right subarticular zone and moderate right neural foraminal  narrowing. L5-S1: Loss of disc height, disc bulge with associated left foraminal/far lateral osteophytic component and mild facet degenerative changes. Findings result in mild right and mild-to-moderate left neural foraminal narrowing. No significant spinal canal stenosis. IMPRESSION: 1. Subacute compression fracture of the superior endplate of L2 with approximately 25% height loss. No significant retropulsion. 2. Degenerative changes at L4-5 resulting in mild spinal canal stenosis with moderate narrowing of the right subarticular zone and moderate right neural foraminal narrowing. 3. Mild right and mild-to-moderate left neural foraminal narrowing at L5-S1. Electronically Signed   By: Pedro Earls M.D.   On: 11/02/2022 16:55   CT Lumbar Spine Wo Contrast  Result Date: 10/30/2022 CLINICAL DATA:  Initial evaluation for acute trauma, ataxia. EXAM: CT LUMBAR SPINE WITHOUT CONTRAST TECHNIQUE: Multidetector CT imaging of the lumbar spine was performed without intravenous contrast administration. Multiplanar CT image reconstructions were also generated. RADIATION DOSE REDUCTION: This exam was performed according to the departmental dose-optimization program which includes automated exposure control, adjustment of  the mA and/or kV according to patient size and/or use of iterative reconstruction technique. COMPARISON:  CT from 10/24/2022. FINDINGS: Segmentation: Standard. Lowest well-formed disc space labeled the L5-S1 level. Alignment: Stable line mint with trace degenerative retrolisthesis of L5 on S1, with trace anterolisthesis of L4 on L5. Findings likely chronic and facet mediated. Vertebrae: Previously identified probable early subacute compression fracture at the superior endplate of L2 again seen. Slightly increased sclerosis about the fracture since prior. Height loss is not significantly changed or progressed without bony retropulsion. Additional probable subacute healing fractures of the right  posterior eleventh and twelfth ribs noted as well. No other new or interval fracture. No discrete or worrisome osseous lesions. Paraspinal and other soft tissues: Paraspinous soft tissues demonstrate no acute finding. Aorto bi-iliac atherosclerotic disease. Disc levels: Underlying advanced chronic disc and endplate degeneration at L4-5 and L5-S1 with disc desiccation and endplate spurring. Lower lumbar facet hypertrophy, greatest at L4-5 on the right. IMPRESSION: 1. Previously identified probable early subacute compression fracture at the superior endplate of L2 again seen. Include Reese sclerosis about the fracture, but without progressive height loss or bony retropulsion. 2. Additional probable subacute healing fractures of the right posterior eleventh and twelfth ribs. 3. No other new or acute traumatic osseous injury within the lumbar spine. 4. Underlying advanced chronic disc and endplate degeneration at L4-5 and L5-S1. Aortic Atherosclerosis (ICD10-I70.0). Electronically Signed   By: Jeannine Boga M.D.   On: 10/30/2022 01:22   CT Head Wo Contrast  Result Date: 10/30/2022 CLINICAL DATA:  Fall, ETOH EXAM: CT HEAD WITHOUT CONTRAST CT CERVICAL SPINE WITHOUT CONTRAST TECHNIQUE: Multidetector CT imaging of the head and cervical spine was performed following the standard protocol without intravenous contrast. Multiplanar CT image reconstructions of the cervical spine were also generated. RADIATION DOSE REDUCTION: This exam was performed according to the departmental dose-optimization program which includes automated exposure control, adjustment of the mA and/or kV according to patient size and/or use of iterative reconstruction technique. COMPARISON:  10/24/2022 FINDINGS: CT HEAD FINDINGS Brain: No evidence of acute infarction, hemorrhage, hydrocephalus, extra-axial collection or mass lesion/mass effect. Global cortical atrophy. Subcortical white matter and periventricular small vessel ischemic changes.  Left basal ganglia calcifications. Vascular: Mild intracranial atherosclerosis. Skull: Normal. Negative for fracture or focal lesion. Sinuses/Orbits: The visualized paranasal sinuses are essentially clear. The mastoid air cells are unopacified. Other: None. CT CERVICAL SPINE FINDINGS Alignment: Normal cervical lordosis. Skull base and vertebrae: No acute fracture. No primary bone lesion or focal pathologic process. Soft tissues and spinal canal: No prevertebral fluid or swelling. No visible canal hematoma. Disc levels: Mild degenerative changes of the mid cervical spine. Spinal canal is patent. Upper chest: Visualized lung apices are clear. Other: Visualized thyroid is unremarkable. IMPRESSION: No evidence of acute intracranial abnormality. Atrophy with small vessel ischemic changes. No evidence of traumatic injury to the cervical spine. Mild degenerative changes. Electronically Signed   By: Julian Hy M.D.   On: 10/30/2022 01:19   CT Cervical Spine Wo Contrast  Result Date: 10/30/2022 CLINICAL DATA:  Fall, ETOH EXAM: CT HEAD WITHOUT CONTRAST CT CERVICAL SPINE WITHOUT CONTRAST TECHNIQUE: Multidetector CT imaging of the head and cervical spine was performed following the standard protocol without intravenous contrast. Multiplanar CT image reconstructions of the cervical spine were also generated. RADIATION DOSE REDUCTION: This exam was performed according to the departmental dose-optimization program which includes automated exposure control, adjustment of the mA and/or kV according to patient size and/or use of iterative reconstruction technique. COMPARISON:  10/24/2022 FINDINGS: CT HEAD FINDINGS Brain: No evidence of acute infarction, hemorrhage, hydrocephalus, extra-axial collection or mass lesion/mass effect. Global cortical atrophy. Subcortical white matter and periventricular small vessel ischemic changes. Left basal ganglia calcifications. Vascular: Mild intracranial atherosclerosis. Skull: Normal.  Negative for fracture or focal lesion. Sinuses/Orbits: The visualized paranasal sinuses are essentially clear. The mastoid air cells are unopacified. Other: None. CT CERVICAL SPINE FINDINGS Alignment: Normal cervical lordosis. Skull base and vertebrae: No acute fracture. No primary bone lesion or focal pathologic process. Soft tissues and spinal canal: No prevertebral fluid or swelling. No visible canal hematoma. Disc levels: Mild degenerative changes of the mid cervical spine. Spinal canal is patent. Upper chest: Visualized lung apices are clear. Other: Visualized thyroid is unremarkable. IMPRESSION: No evidence of acute intracranial abnormality. Atrophy with small vessel ischemic changes. No evidence of traumatic injury to the cervical spine. Mild degenerative changes. Electronically Signed   By: Julian Hy M.D.   On: 10/30/2022 01:19   CT Lumbar Spine Wo Contrast  Result Date: 10/24/2022 CLINICAL DATA:  81 year old male with 2-3 weeks of back pain after recurrent falls. EXAM: CT LUMBAR SPINE WITHOUT CONTRAST TECHNIQUE: Multidetector CT imaging of the lumbar spine was performed without intravenous contrast administration. Multiplanar CT image reconstructions were also generated. RADIATION DOSE REDUCTION: This exam was performed according to the departmental dose-optimization program which includes automated exposure control, adjustment of the mA and/or kV according to patient size and/or use of iterative reconstruction technique. COMPARISON:  CT Abdomen and Pelvis 06/11/2022. FINDINGS: Segmentation: Normal. Alignment: Stable lumbar lordosis since June. Subtle lower lumbar spondylolisthesis. Vertebrae: Diffuse osteopenia. Subtle superior endplate compression at L2 appears increased since June (series 6, image 37). L2 pedicles and posterior elements appear intact. And there are subacute appearing fractures of the right posterior 11th and 12th ribs at the costovertebral junctions, new since June. Other  lumbar levels appear intact. Intact visible sacrum and SI joints. Paraspinal and other soft tissues: Stable. Aortoiliac calcified atherosclerosis. Lumbar paraspinal soft tissues remain within normal limits. Disc levels: Advanced chronic lumbar disc and endplate degeneration at L4-L5 and L5-S1 with vacuum disc, endplate and facet spurring. IMPRESSION: 1. Subtle L2 compression fracture is new since June, age indeterminate but suspicious for recent fracture in this setting. Superimposed subacute appearing posterior right 11th and 12th rib fractures. Underlying osteopenia. If specific therapy such as vertebroplasty is desired, Lumbar MRI or Nuclear Medicine Whole-body Bone Scan would best determine acuity. 2. No other acute osseous abnormality in the lumbar spine. Advanced chronic lumbar spine degeneration at L4-L5 and L5-S1. 3.  Aortic Atherosclerosis (ICD10-I70.0). Electronically Signed   By: Genevie Ann M.D.   On: 10/24/2022 10:01   CT Cervical Spine Wo Contrast  Result Date: 10/24/2022 CLINICAL DATA:  81 year old male with 2-3 weeks of back pain after recurrent falls. EXAM: CT CERVICAL SPINE WITHOUT CONTRAST TECHNIQUE: Multidetector CT imaging of the cervical spine was performed without intravenous contrast. Multiplanar CT image reconstructions were also generated. RADIATION DOSE REDUCTION: This exam was performed according to the departmental dose-optimization program which includes automated exposure control, adjustment of the mA and/or kV according to patient size and/or use of iterative reconstruction technique. COMPARISON:  Head CT today. FINDINGS: Alignment: Mild straightening of cervical lordosis. Subtle degenerative anterolisthesis of C3 on C4. Cervicothoracic junction alignment is within normal limits. Bilateral posterior element alignment is within normal limits. Skull base and vertebrae: Visualized skull base is intact. No atlanto-occipital dissociation. C1 and C2 appear intact and aligned. No acute  osseous abnormality identified.  Osteopenia. Soft tissues and spinal canal: No prevertebral fluid or swelling. No visible canal hematoma. Negative noncontrast visible neck soft tissues. Disc levels: Cervical spine degeneration although age-appropriate at many levels. Fairly advanced disc and endplate degeneration Z6-X0 through C6-C7. Up to mild associated spinal stenosis. Upper chest: Upper thoracic levels demonstrate osteopenia but appear to be intact. Negative lung apices. Negative noncontrast thoracic inlet. IMPRESSION: 1. No acute traumatic injury identified in the cervical spine. 2. Osteopenia. 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 Electronically Signed   By: Genevie Ann M.D.   On: 10/24/2022 09:57   CT HEAD WO CONTRAST (5MM)  Result Date: 10/24/2022 CLINICAL DATA:  81 year old male with 2-3 weeks of back pain after recurrent falls. EXAM: CT HEAD WITHOUT CONTRAST TECHNIQUE: Contiguous axial images were obtained from the base of the skull through the vertex without intravenous contrast. RADIATION DOSE REDUCTION: This exam was performed according to the departmental dose-optimization program which includes automated exposure control, adjustment of the mA and/or kV according to patient size and/or use of iterative reconstruction technique. COMPARISON:  Brain MRI 09/15/2020.  Head CT 02/14/2022. FINDINGS: Brain: Conspicuous chronic dystrophic calcification in the dorsal left thalamus is stable since February and was present on the 2021 MRI. Stable cerebral volume. No midline shift, ventriculomegaly, mass effect, evidence of mass lesion, intracranial hemorrhage or evidence of cortically based acute infarction. Outside of the left thalamus gray-white matter differentiation appears within normal limits for age. Vascular: Calcified atherosclerosis at the skull base. No suspicious intracranial vascular hyperdensity. Skull: Stable.  No acute osseous abnormality identified. Sinuses/Orbits:  Visualized paranasal sinuses and mastoids are clear. Other: No acute orbit or scalp soft tissue injury. IMPRESSION: 1. No acute intracranial abnormality or acute traumatic injury identified. 2. 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 Electronically Signed   By: Genevie Ann M.D.   On: 10/24/2022 09:54    Microbiology: Results for orders placed or performed during the hospital encounter of 11/18/22  Resp Panel by RT-PCR (Flu A&B, Covid) Anterior Nasal Swab     Status: None   Collection Time: 11/18/22  4:48 PM   Specimen: Anterior Nasal Swab  Result Value Ref Range Status   SARS Coronavirus 2 by RT PCR NEGATIVE NEGATIVE Final    Comment: (NOTE) SARS-CoV-2 target nucleic acids are NOT DETECTED.  The SARS-CoV-2 RNA is generally detectable in upper respiratory specimens during the acute phase of infection. The lowest concentration of SARS-CoV-2 viral copies this assay can detect is 138 copies/mL. A negative result does not preclude SARS-Cov-2 infection and should not be used as the sole basis for treatment or other patient management decisions. A negative result may occur with  improper specimen collection/handling, submission of specimen other than nasopharyngeal swab, presence of viral mutation(s) within the areas targeted by this assay, and inadequate number of viral copies(<138 copies/mL). A negative result must be combined with clinical observations, patient history, and epidemiological information. The expected result is Negative.  Fact Sheet for Patients:  EntrepreneurPulse.com.au  Fact Sheet for Healthcare Providers:  IncredibleEmployment.be  This test is no t yet approved or cleared by the [place] FDA and  has been authorized for detection and/or diagnosis of SARS-CoV-2 by FDA under an Emergency Use Authorization (EUA). This EUA will remain  in effect (meaning this test can be used) for the duration of  the COVID-19 declaration under Section 564(b)(1) of the Act, 21 U.S.C.section 360bbb-3(b)(1), unless the authorization is terminated  or revoked sooner.  Influenza A by PCR NEGATIVE NEGATIVE Final   Influenza B by PCR NEGATIVE NEGATIVE Final    Comment: (NOTE) The Xpert Xpress SARS-CoV-2/FLU/RSV plus assay is intended as an aid in the diagnosis of influenza from Nasopharyngeal swab specimens and should not be used as a sole basis for treatment. Nasal washings and aspirates are unacceptable for Xpert Xpress SARS-CoV-2/FLU/RSV testing.  Fact Sheet for Patients: EntrepreneurPulse.com.au  Fact Sheet for Healthcare Providers: IncredibleEmployment.be  This test is not yet approved or cleared by the [place] FDA and has been authorized for detection and/or diagnosis of SARS-CoV-2 by FDA under an Emergency Use Authorization (EUA). This EUA will remain in effect (meaning this test can be used) for the duration of the COVID-19 declaration under Section 564(b)(1) of the Act, 21 U.S.C. section 360bbb-3(b)(1), unless the authorization is terminated or revoked.  Performed at [facility], [address] [place], [place] [address]     Labs: CBC: Recent Labs  Lab 11/18/22 1415 11/19/22 0421 11/20/22 0504  WBC 5.2 3.0* 4.8  HGB 12.3* 9.5* 9.3*  HCT 36.1* 28.0* 27.3*  MCV 95.5 96.2 94.8  PLT 340 264 035   Basic Metabolic Panel: Recent Labs  Lab 11/18/22 1415 11/19/22 0421 11/20/22 0504  NA 134* 134* 136  K 3.7 3.6 3.7  CL 95* 96* 103  CO2 '25 23 25  '$ GLUCOSE 138* 224* 308*  BUN '15 16 23  '$ CREATININE 1.14 0.89 1.03  CALCIUM 8.3* 7.2* 8.1*  MG  --  1.7  --     CBG: Recent Labs  Lab 11/19/22 1618 11/19/22 2217 11/20/22 0842  GLUCAP 351* 346* 275*    Discharge time spent: greater than 30 minutes.  Signed: Loletha Grayer, MD [facility] 11/20/2022

## 2022-11-21 LAB — HEMOGLOBIN A1C
Hgb A1c MFr Bld: 7.4 % — ABNORMAL HIGH (ref 4.8–5.6)
Mean Plasma Glucose: 166 mg/dL

## 2022-11-24 ENCOUNTER — Ambulatory Visit
Admission: RE | Admit: 2022-11-24 | Discharge: 2022-11-24 | Disposition: A | Discharge status: Home / Self Care | Payer: Medicare Other | Source: Ambulatory Visit | Attending: Interventional Radiology | Admitting: Interventional Radiology

## 2022-11-24 DIAGNOSIS — S32020A Wedge compression fracture of second lumbar vertebra, initial encounter for closed fracture: Secondary | ICD-10-CM

## 2022-12-20 ENCOUNTER — Other Ambulatory Visit: Payer: Self-pay | Admitting: Internal Medicine

## 2022-12-20 DIAGNOSIS — R1310 Dysphagia, unspecified: Secondary | ICD-10-CM

## 2022-12-22 ENCOUNTER — Other Ambulatory Visit: Payer: Medicare Other

## 2022-12-22 ENCOUNTER — Inpatient Hospital Stay
Admission: EM | Admit: 2022-12-22 | Discharge: 2023-01-26 | DRG: 100 | Disposition: E | Payer: Medicare Other | Attending: Pulmonary Disease | Admitting: Pulmonary Disease

## 2022-12-22 ENCOUNTER — Observation Stay: Payer: Medicare Other

## 2022-12-22 ENCOUNTER — Inpatient Hospital Stay: Payer: Medicare Other

## 2022-12-22 ENCOUNTER — Encounter: Payer: Self-pay | Admitting: Emergency Medicine

## 2022-12-22 ENCOUNTER — Emergency Department: Payer: Medicare Other

## 2022-12-22 ENCOUNTER — Other Ambulatory Visit: Payer: Self-pay

## 2022-12-22 DIAGNOSIS — Z79899 Other long term (current) drug therapy: Secondary | ICD-10-CM

## 2022-12-22 DIAGNOSIS — L89156 Pressure-induced deep tissue damage of sacral region: Secondary | ICD-10-CM | POA: Diagnosis present

## 2022-12-22 DIAGNOSIS — N179 Acute kidney failure, unspecified: Secondary | ICD-10-CM | POA: Diagnosis not present

## 2022-12-22 DIAGNOSIS — F10930 Alcohol use, unspecified with withdrawal, uncomplicated: Secondary | ICD-10-CM

## 2022-12-22 DIAGNOSIS — E7800 Pure hypercholesterolemia, unspecified: Secondary | ICD-10-CM | POA: Diagnosis present

## 2022-12-22 DIAGNOSIS — Z66 Do not resuscitate: Secondary | ICD-10-CM | POA: Diagnosis present

## 2022-12-22 DIAGNOSIS — Z806 Family history of leukemia: Secondary | ICD-10-CM

## 2022-12-22 DIAGNOSIS — R34 Anuria and oliguria: Secondary | ICD-10-CM | POA: Diagnosis not present

## 2022-12-22 DIAGNOSIS — E873 Alkalosis: Secondary | ICD-10-CM | POA: Diagnosis present

## 2022-12-22 DIAGNOSIS — F10939 Alcohol use, unspecified with withdrawal, unspecified: Secondary | ICD-10-CM | POA: Diagnosis present

## 2022-12-22 DIAGNOSIS — K219 Gastro-esophageal reflux disease without esophagitis: Secondary | ICD-10-CM | POA: Diagnosis present

## 2022-12-22 DIAGNOSIS — Z1152 Encounter for screening for COVID-19: Secondary | ICD-10-CM | POA: Diagnosis not present

## 2022-12-22 DIAGNOSIS — Z794 Long term (current) use of insulin: Secondary | ICD-10-CM

## 2022-12-22 DIAGNOSIS — G40909 Epilepsy, unspecified, not intractable, without status epilepticus: Secondary | ICD-10-CM | POA: Diagnosis present

## 2022-12-22 DIAGNOSIS — Z515 Encounter for palliative care: Secondary | ICD-10-CM

## 2022-12-22 DIAGNOSIS — R569 Unspecified convulsions: Secondary | ICD-10-CM | POA: Diagnosis not present

## 2022-12-22 DIAGNOSIS — D6959 Other secondary thrombocytopenia: Secondary | ICD-10-CM | POA: Diagnosis present

## 2022-12-22 DIAGNOSIS — F0283 Dementia in other diseases classified elsewhere, unspecified severity, with mood disturbance: Secondary | ICD-10-CM | POA: Diagnosis present

## 2022-12-22 DIAGNOSIS — J9602 Acute respiratory failure with hypercapnia: Secondary | ICD-10-CM | POA: Diagnosis not present

## 2022-12-22 DIAGNOSIS — G9340 Encephalopathy, unspecified: Secondary | ICD-10-CM

## 2022-12-22 DIAGNOSIS — J95851 Ventilator associated pneumonia: Secondary | ICD-10-CM | POA: Diagnosis not present

## 2022-12-22 DIAGNOSIS — Z87891 Personal history of nicotine dependence: Secondary | ICD-10-CM

## 2022-12-22 DIAGNOSIS — I952 Hypotension due to drugs: Secondary | ICD-10-CM | POA: Diagnosis not present

## 2022-12-22 DIAGNOSIS — F32A Depression, unspecified: Secondary | ICD-10-CM | POA: Diagnosis present

## 2022-12-22 DIAGNOSIS — N170 Acute kidney failure with tubular necrosis: Secondary | ICD-10-CM | POA: Diagnosis not present

## 2022-12-22 DIAGNOSIS — E7220 Disorder of urea cycle metabolism, unspecified: Secondary | ICD-10-CM | POA: Diagnosis not present

## 2022-12-22 DIAGNOSIS — J939 Pneumothorax, unspecified: Secondary | ICD-10-CM | POA: Diagnosis not present

## 2022-12-22 DIAGNOSIS — I10 Essential (primary) hypertension: Secondary | ICD-10-CM | POA: Diagnosis present

## 2022-12-22 DIAGNOSIS — R4182 Altered mental status, unspecified: Secondary | ICD-10-CM | POA: Diagnosis not present

## 2022-12-22 DIAGNOSIS — J9601 Acute respiratory failure with hypoxia: Secondary | ICD-10-CM | POA: Diagnosis not present

## 2022-12-22 DIAGNOSIS — G928 Other toxic encephalopathy: Secondary | ICD-10-CM | POA: Diagnosis not present

## 2022-12-22 DIAGNOSIS — Z8042 Family history of malignant neoplasm of prostate: Secondary | ICD-10-CM

## 2022-12-22 DIAGNOSIS — D696 Thrombocytopenia, unspecified: Secondary | ICD-10-CM | POA: Diagnosis present

## 2022-12-22 DIAGNOSIS — Z7982 Long term (current) use of aspirin: Secondary | ICD-10-CM

## 2022-12-22 DIAGNOSIS — G309 Alzheimer's disease, unspecified: Secondary | ICD-10-CM | POA: Diagnosis present

## 2022-12-22 DIAGNOSIS — Y848 Other medical procedures as the cause of abnormal reaction of the patient, or of later complication, without mention of misadventure at the time of the procedure: Secondary | ICD-10-CM | POA: Diagnosis not present

## 2022-12-22 DIAGNOSIS — F419 Anxiety disorder, unspecified: Secondary | ICD-10-CM | POA: Diagnosis present

## 2022-12-22 DIAGNOSIS — Z8249 Family history of ischemic heart disease and other diseases of the circulatory system: Secondary | ICD-10-CM

## 2022-12-22 DIAGNOSIS — G4733 Obstructive sleep apnea (adult) (pediatric): Secondary | ICD-10-CM | POA: Diagnosis present

## 2022-12-22 DIAGNOSIS — R4189 Other symptoms and signs involving cognitive functions and awareness: Secondary | ICD-10-CM | POA: Diagnosis present

## 2022-12-22 DIAGNOSIS — E119 Type 2 diabetes mellitus without complications: Secondary | ICD-10-CM | POA: Diagnosis present

## 2022-12-22 DIAGNOSIS — R7989 Other specified abnormal findings of blood chemistry: Secondary | ICD-10-CM | POA: Diagnosis present

## 2022-12-22 DIAGNOSIS — E871 Hypo-osmolality and hyponatremia: Secondary | ICD-10-CM | POA: Diagnosis present

## 2022-12-22 DIAGNOSIS — F0390 Unspecified dementia without behavioral disturbance: Secondary | ICD-10-CM | POA: Diagnosis present

## 2022-12-22 DIAGNOSIS — F10231 Alcohol dependence with withdrawal delirium: Secondary | ICD-10-CM | POA: Diagnosis present

## 2022-12-22 DIAGNOSIS — J449 Chronic obstructive pulmonary disease, unspecified: Secondary | ICD-10-CM | POA: Diagnosis present

## 2022-12-22 DIAGNOSIS — M1990 Unspecified osteoarthritis, unspecified site: Secondary | ICD-10-CM | POA: Diagnosis present

## 2022-12-22 DIAGNOSIS — F10931 Alcohol use, unspecified with withdrawal delirium: Secondary | ICD-10-CM | POA: Diagnosis not present

## 2022-12-22 DIAGNOSIS — Z7189 Other specified counseling: Secondary | ICD-10-CM | POA: Diagnosis not present

## 2022-12-22 DIAGNOSIS — A419 Sepsis, unspecified organism: Secondary | ICD-10-CM | POA: Diagnosis not present

## 2022-12-22 DIAGNOSIS — Z886 Allergy status to analgesic agent status: Secondary | ICD-10-CM

## 2022-12-22 DIAGNOSIS — Z882 Allergy status to sulfonamides status: Secondary | ICD-10-CM

## 2022-12-22 DIAGNOSIS — J440 Chronic obstructive pulmonary disease with acute lower respiratory infection: Secondary | ICD-10-CM | POA: Diagnosis present

## 2022-12-22 DIAGNOSIS — E876 Hypokalemia: Secondary | ICD-10-CM | POA: Diagnosis not present

## 2022-12-22 DIAGNOSIS — I2510 Atherosclerotic heart disease of native coronary artery without angina pectoris: Secondary | ICD-10-CM | POA: Diagnosis present

## 2022-12-22 DIAGNOSIS — T4275XA Adverse effect of unspecified antiepileptic and sedative-hypnotic drugs, initial encounter: Secondary | ICD-10-CM | POA: Diagnosis not present

## 2022-12-22 DIAGNOSIS — E785 Hyperlipidemia, unspecified: Secondary | ICD-10-CM | POA: Diagnosis present

## 2022-12-22 LAB — URINALYSIS, ROUTINE W REFLEX MICROSCOPIC
Bilirubin Urine: NEGATIVE
Glucose, UA: NEGATIVE mg/dL
Ketones, ur: 5 mg/dL — AB
Leukocytes,Ua: NEGATIVE
Nitrite: NEGATIVE
Protein, ur: 100 mg/dL — AB
RBC / HPF: >50 RBC/hpf — ABNORMAL HIGH (ref 0–5)
Specific Gravity, Urine: 1.01 (ref 1.005–1.030)
pH: 7 (ref 5.0–8.0)

## 2022-12-22 LAB — BASIC METABOLIC PANEL
Anion gap: 14 (ref 5–15)
BUN: 15 mg/dL (ref 8–23)
CO2: 24 mmol/L (ref 22–32)
Calcium: 8.3 mg/dL — ABNORMAL LOW (ref 8.9–10.3)
Chloride: 101 mmol/L (ref 98–111)
Creatinine, Ser: 1.13 mg/dL (ref 0.61–1.24)
GFR, Estimated: >60 mL/min (ref >60)
Glucose, Bld: 138 mg/dL — ABNORMAL HIGH (ref 70–99)
Potassium: 3.1 mmol/L — ABNORMAL LOW (ref 3.5–5.1)
Sodium: 139 mmol/L (ref 135–145)

## 2022-12-22 LAB — CBC
HCT: 35.5 % — ABNORMAL LOW (ref 39.0–52.0)
Hemoglobin: 12.1 g/dL — ABNORMAL LOW (ref 13.0–17.0)
MCH: 32.8 pg (ref 26.0–34.0)
MCHC: 34.1 g/dL (ref 30.0–36.0)
MCV: 96.2 fL (ref 80.0–100.0)
Platelets: 125 10*3/uL — ABNORMAL LOW (ref 150–400)
RBC: 3.69 MIL/uL — ABNORMAL LOW (ref 4.22–5.81)
RDW: 15.7 % — ABNORMAL HIGH (ref 11.5–15.5)
WBC: 3.9 10*3/uL — ABNORMAL LOW (ref 4.0–10.5)
nRBC: 1 % — ABNORMAL HIGH (ref 0.0–0.2)

## 2022-12-22 LAB — HEPATIC FUNCTION PANEL
ALT: 62 U/L — ABNORMAL HIGH (ref 0–44)
AST: 81 U/L — ABNORMAL HIGH (ref 15–41)
Albumin: 3.4 g/dL — ABNORMAL LOW (ref 3.5–5.0)
Alkaline Phosphatase: 85 U/L (ref 38–126)
Bilirubin, Direct: 0.3 mg/dL — ABNORMAL HIGH (ref 0.0–0.2)
Indirect Bilirubin: 0.9 mg/dL (ref 0.3–0.9)
Total Bilirubin: 1.2 mg/dL (ref 0.3–1.2)
Total Protein: 6.4 g/dL — ABNORMAL LOW (ref 6.5–8.1)

## 2022-12-22 LAB — BLOOD GAS, ARTERIAL
Acid-Base Excess: 6.4 mmol/L — ABNORMAL HIGH (ref 0.0–2.0)
Bicarbonate: 29.4 mmol/L — ABNORMAL HIGH (ref 20.0–28.0)
FIO2: 0.3 %
MECHVT: 480 mL
O2 Saturation: 97.9 %
PEEP: 5 cmH2O
Patient temperature: 37
RATE: 16 resp/min
pCO2 arterial: 36 mmHg (ref 32–48)
pH, Arterial: 7.52 — ABNORMAL HIGH (ref 7.35–7.45)
pO2, Arterial: 81 mmHg — ABNORMAL LOW (ref 83–108)

## 2022-12-22 LAB — RESP PANEL BY RT-PCR (FLU A&B, COVID) ARPGX2
Influenza A by PCR: NEGATIVE
Influenza B by PCR: NEGATIVE
SARS Coronavirus 2 by RT PCR: NEGATIVE

## 2022-12-22 LAB — MRSA NEXT GEN BY PCR, NASAL: MRSA by PCR Next Gen: NOT DETECTED

## 2022-12-22 LAB — TROPONIN I (HIGH SENSITIVITY): Troponin I (High Sensitivity): 55 ng/L — ABNORMAL HIGH (ref <18)

## 2022-12-22 LAB — PHOSPHORUS: Phosphorus: 3.1 mg/dL (ref 2.5–4.6)

## 2022-12-22 LAB — ETHANOL: Alcohol, Ethyl (B): <10 mg/dL (ref <10)

## 2022-12-22 LAB — MAGNESIUM: Magnesium: 1.6 mg/dL — ABNORMAL LOW (ref 1.7–2.4)

## 2022-12-22 MED ORDER — ETOMIDATE 2 MG/ML IV SOLN: 20.0000 mg | Freq: Once | INTRAVENOUS | Status: AC

## 2022-12-22 MED ORDER — CHLORDIAZEPOXIDE HCL 5 MG PO CAPS: 10.0000 mg | ORAL_CAPSULE | Freq: Three times a day (TID) | ORAL | Status: DC

## 2022-12-22 MED ORDER — INSULIN ASPART 100 UNIT/ML IJ SOLN
0.0000 [IU] | INTRAMUSCULAR | Status: DC
  Administered 2022-12-22: 4 [IU] via SUBCUTANEOUS
  Administered 2022-12-23 (×3): 7 [IU] via SUBCUTANEOUS
  Administered 2022-12-23: 4 [IU] via SUBCUTANEOUS
  Administered 2022-12-23 (×2): 7 [IU] via SUBCUTANEOUS
  Administered 2022-12-23: 4 [IU] via SUBCUTANEOUS
  Administered 2022-12-24: 3 [IU] via SUBCUTANEOUS
  Administered 2022-12-24 (×3): 4 [IU] via SUBCUTANEOUS
  Administered 2022-12-24: 7 [IU] via SUBCUTANEOUS
  Administered 2022-12-24 – 2022-12-25 (×4): 4 [IU] via SUBCUTANEOUS
  Administered 2022-12-25 – 2022-12-26 (×4): 3 [IU] via SUBCUTANEOUS
  Filled 2022-12-22 (×22): qty 1

## 2022-12-22 MED ORDER — FENTANYL CITRATE PF 50 MCG/ML IJ SOSY
PREFILLED_SYRINGE | INTRAMUSCULAR | Status: AC
  Filled 2022-12-22: qty 1

## 2022-12-22 MED ORDER — FENTANYL BOLUS VIA INFUSION: 25.0000 ug | INTRAVENOUS | Status: DC | PRN

## 2022-12-22 MED ORDER — ENOXAPARIN SODIUM 40 MG/0.4ML IJ SOSY
40.0000 mg | PREFILLED_SYRINGE | INTRAMUSCULAR | Status: DC
  Administered 2022-12-22 – 2022-12-23 (×2): 40 mg via SUBCUTANEOUS
  Filled 2022-12-22 (×2): qty 0.4

## 2022-12-22 MED ORDER — ROCURONIUM BROMIDE 10 MG/ML (PF) SYRINGE
PREFILLED_SYRINGE | INTRAVENOUS | Status: AC
  Administered 2022-12-22: 80 mg via INTRAVENOUS
  Filled 2022-12-22: qty 10

## 2022-12-22 MED ORDER — PROPOFOL 1000 MG/100ML IV EMUL
0.0000 ug/kg/min | INTRAVENOUS | Status: DC
  Administered 2022-12-22: 20 ug/kg/min via INTRAVENOUS
  Administered 2022-12-22 – 2022-12-23 (×5): 40 ug/kg/min via INTRAVENOUS
  Administered 2022-12-24 (×2): 30 ug/kg/min via INTRAVENOUS
  Administered 2022-12-24: 40 ug/kg/min via INTRAVENOUS
  Administered 2022-12-25: 30 ug/kg/min via INTRAVENOUS
  Administered 2022-12-26: 45 ug/kg/min via INTRAVENOUS
  Administered 2022-12-26 (×2): 20 ug/kg/min via INTRAVENOUS
  Administered 2022-12-27: 30 ug/kg/min via INTRAVENOUS
  Filled 2022-12-22 (×7): qty 100
  Filled 2022-12-22 (×2): qty 200
  Filled 2022-12-22 (×7): qty 100

## 2022-12-22 MED ORDER — CHLORDIAZEPOXIDE HCL 5 MG PO CAPS
10.0000 mg | ORAL_CAPSULE | Freq: Three times a day (TID) | ORAL | Status: DC
  Administered 2022-12-22 – 2022-12-26 (×11): 10 mg
  Filled 2022-12-22 (×11): qty 2

## 2022-12-22 MED ORDER — FAMOTIDINE IN NACL 20-0.9 MG/50ML-% IV SOLN
20.0000 mg | Freq: Once | INTRAVENOUS | Status: AC
  Administered 2022-12-22: 20 mg via INTRAVENOUS
  Filled 2022-12-22: qty 50

## 2022-12-22 MED ORDER — HYDRALAZINE HCL 20 MG/ML IJ SOLN: 5.0000 mg | INTRAMUSCULAR | Status: DC | PRN

## 2022-12-22 MED ORDER — FENTANYL CITRATE PF 50 MCG/ML IJ SOSY
12.5000 ug | PREFILLED_SYRINGE | Freq: Once | INTRAMUSCULAR | Status: AC
  Administered 2022-12-22: 12.5 ug via INTRAVENOUS

## 2022-12-22 MED ORDER — LEVETIRACETAM IN NACL 1000 MG/100ML IV SOLN
1000.0000 mg | Freq: Once | INTRAVENOUS | Status: AC
  Administered 2022-12-22: 1000 mg via INTRAVENOUS
  Filled 2022-12-22: qty 100

## 2022-12-22 MED ORDER — DEXAMETHASONE SODIUM PHOSPHATE 4 MG/ML IJ SOLN
4.0000 mg | Freq: Four times a day (QID) | INTRAMUSCULAR | Status: DC
  Administered 2022-12-22 – 2022-12-23 (×3): 4 mg via INTRAVENOUS
  Filled 2022-12-22 (×3): qty 1

## 2022-12-22 MED ORDER — POTASSIUM CHLORIDE 20 MEQ PO PACK
40.0000 meq | PACK | Freq: Once | ORAL | Status: AC
  Administered 2022-12-22: 40 meq
  Filled 2022-12-22: qty 2

## 2022-12-22 MED ORDER — ROCURONIUM BROMIDE 50 MG/5ML IV SOLN
80.0000 mg | Freq: Once | INTRAVENOUS | Status: AC
  Filled 2022-12-22: qty 8

## 2022-12-22 MED ORDER — ONDANSETRON HCL 4 MG/2ML IJ SOLN: 4.0000 mg | Freq: Three times a day (TID) | INTRAMUSCULAR | Status: DC | PRN

## 2022-12-22 MED ORDER — DEXMEDETOMIDINE HCL IN NACL 400 MCG/100ML IV SOLN: 0.0000 ug/kg/h | INTRAVENOUS | Status: DC

## 2022-12-22 MED ORDER — POTASSIUM CHLORIDE 20 MEQ PO PACK
40.0000 meq | PACK | ORAL | Status: DC
  Filled 2022-12-22: qty 2

## 2022-12-22 MED ORDER — LORAZEPAM 1 MG PO TABS: 1.0000 mg | ORAL_TABLET | Freq: Once | ORAL | Status: DC

## 2022-12-22 MED ORDER — FENTANYL CITRATE PF 50 MCG/ML IJ SOSY: 100.0000 ug | PREFILLED_SYRINGE | Freq: Once | INTRAMUSCULAR | Status: AC

## 2022-12-22 MED ORDER — POTASSIUM CHLORIDE CRYS ER 20 MEQ PO TBCR: 40.0000 meq | EXTENDED_RELEASE_TABLET | ORAL | Status: DC

## 2022-12-22 MED ORDER — SODIUM CHLORIDE 0.9 % IV SOLN: INTRAVENOUS | Status: DC

## 2022-12-22 MED ORDER — DOCUSATE SODIUM 50 MG/5ML PO LIQD
100.0000 mg | Freq: Two times a day (BID) | ORAL | Status: DC
  Administered 2022-12-22 – 2023-01-06 (×22): 100 mg
  Filled 2022-12-22 (×25): qty 10

## 2022-12-22 MED ORDER — IPRATROPIUM-ALBUTEROL 0.5-2.5 (3) MG/3ML IN SOLN
3.0000 mL | RESPIRATORY_TRACT | Status: DC
  Administered 2022-12-22 – 2023-01-01 (×58): 3 mL via RESPIRATORY_TRACT
  Filled 2022-12-22 (×58): qty 3

## 2022-12-22 MED ORDER — HALOPERIDOL LACTATE 5 MG/ML IJ SOLN: 2.5000 mg | Freq: Once | INTRAMUSCULAR | Status: AC

## 2022-12-22 MED ORDER — BUDESONIDE 0.5 MG/2ML IN SUSP
0.5000 mg | Freq: Two times a day (BID) | RESPIRATORY_TRACT | Status: DC
  Administered 2022-12-22 – 2022-12-31 (×18): 0.5 mg via RESPIRATORY_TRACT
  Filled 2022-12-22 (×18): qty 2

## 2022-12-22 MED ORDER — ADULT MULTIVITAMIN W/MINERALS CH
1.0000 | ORAL_TABLET | Freq: Every day | ORAL | Status: DC
  Administered 2022-12-22 – 2022-12-26 (×5): 1 via ORAL
  Filled 2022-12-22 (×5): qty 1

## 2022-12-22 MED ORDER — LORAZEPAM 2 MG/ML IJ SOLN: 2.0000 mg | INTRAMUSCULAR | Status: DC | PRN

## 2022-12-22 MED ORDER — HALOPERIDOL LACTATE 5 MG/ML IJ SOLN
3.0000 mg | Freq: Once | INTRAMUSCULAR | Status: AC
  Administered 2022-12-22: 3 mg via INTRAMUSCULAR
  Filled 2022-12-22: qty 1

## 2022-12-22 MED ORDER — ORAL CARE MOUTH RINSE
15.0000 mL | OROMUCOSAL | Status: DC
  Administered 2022-12-22 – 2023-01-09 (×207): 15 mL via OROMUCOSAL
  Filled 2022-12-22 (×4): qty 15

## 2022-12-22 MED ORDER — ROSUVASTATIN CALCIUM 5 MG PO TABS: 5.0000 mg | ORAL_TABLET | Freq: Every day | ORAL | Status: DC

## 2022-12-22 MED ORDER — FENTANYL 2500MCG IN NS 250ML (10MCG/ML) PREMIX INFUSION
25.0000 ug/h | INTRAVENOUS | Status: DC
  Administered 2022-12-22: 50 ug/h via INTRAVENOUS
  Administered 2022-12-23: 150 ug/h via INTRAVENOUS
  Administered 2022-12-24: 125 ug/h via INTRAVENOUS
  Administered 2022-12-24: 100 ug/h via INTRAVENOUS
  Administered 2022-12-26: 50 ug/h via INTRAVENOUS
  Filled 2022-12-22 (×6): qty 250

## 2022-12-22 MED ORDER — DEXMEDETOMIDINE HCL IN NACL 400 MCG/100ML IV SOLN
INTRAVENOUS | Status: AC
  Administered 2022-12-22: 0.8 ug/kg/h via INTRAVENOUS
  Filled 2022-12-22: qty 100

## 2022-12-22 MED ORDER — HALOPERIDOL LACTATE 5 MG/ML IJ SOLN
INTRAMUSCULAR | Status: AC
  Administered 2022-12-22: 2.5 mg via INTRAVENOUS
  Filled 2022-12-22: qty 1

## 2022-12-22 MED ORDER — SODIUM CHLORIDE 0.9 % IV BOLUS
1500.0000 mL | Freq: Once | INTRAVENOUS | Status: AC
  Administered 2022-12-22: 1500 mL via INTRAVENOUS

## 2022-12-22 MED ORDER — ALBUTEROL SULFATE HFA 108 (90 BASE) MCG/ACT IN AERS: 2.0000 | INHALATION_SPRAY | RESPIRATORY_TRACT | Status: DC | PRN

## 2022-12-22 MED ORDER — LORAZEPAM 2 MG/ML IJ SOLN
2.0000 mg | INTRAMUSCULAR | Status: AC
  Administered 2022-12-22: 2 mg via INTRAVENOUS

## 2022-12-22 MED ORDER — THIAMINE HCL 100 MG/ML IJ SOLN
100.0000 mg | Freq: Every day | INTRAMUSCULAR | Status: DC
  Administered 2022-12-26 – 2023-01-02 (×8): 100 mg via INTRAVENOUS
  Filled 2022-12-22 (×8): qty 2

## 2022-12-22 MED ORDER — CHLORDIAZEPOXIDE HCL 25 MG PO CAPS: 25.0000 mg | ORAL_CAPSULE | Freq: Three times a day (TID) | ORAL | Status: DC

## 2022-12-22 MED ORDER — POLYETHYLENE GLYCOL 3350 17 G PO PACK
17.0000 g | PACK | Freq: Every day | ORAL | Status: DC
  Administered 2022-12-22 – 2023-01-06 (×13): 17 g
  Filled 2022-12-22 (×11): qty 1

## 2022-12-22 MED ORDER — SODIUM CHLORIDE 0.9 % IV BOLUS: 500.0000 mL | Freq: Once | INTRAVENOUS | Status: DC

## 2022-12-22 MED ORDER — THIAMINE MONONITRATE 100 MG PO TABS
100.0000 mg | ORAL_TABLET | Freq: Every day | ORAL | Status: DC
  Administered 2022-12-22: 100 mg via ORAL
  Filled 2022-12-22: qty 1

## 2022-12-22 MED ORDER — FENTANYL CITRATE PF 50 MCG/ML IJ SOSY: 25.0000 ug | PREFILLED_SYRINGE | Freq: Once | INTRAMUSCULAR | Status: DC

## 2022-12-22 MED ORDER — CHLORHEXIDINE GLUCONATE CLOTH 2 % EX PADS
6.0000 | MEDICATED_PAD | Freq: Every day | CUTANEOUS | Status: DC
  Administered 2022-12-22 – 2023-01-08 (×18): 6 via TOPICAL

## 2022-12-22 MED ORDER — SODIUM CHLORIDE 0.9 % IV SOLN: 250.0000 mL | INTRAVENOUS | Status: DC

## 2022-12-22 MED ORDER — DM-GUAIFENESIN ER 30-600 MG PO TB12
1.0000 | ORAL_TABLET | Freq: Two times a day (BID) | ORAL | Status: DC | PRN
  Administered 2022-12-24: 1 via ORAL
  Filled 2022-12-22: qty 1

## 2022-12-22 MED ORDER — LEVETIRACETAM IN NACL 1000 MG/100ML IV SOLN
1000.0000 mg | Freq: Two times a day (BID) | INTRAVENOUS | Status: DC
  Administered 2022-12-23 – 2022-12-24 (×3): 1000 mg via INTRAVENOUS
  Filled 2022-12-22 (×3): qty 100

## 2022-12-22 MED ORDER — FOLIC ACID 1 MG PO TABS
1.0000 mg | ORAL_TABLET | Freq: Every day | ORAL | Status: DC
  Administered 2022-12-22 – 2022-12-26 (×5): 1 mg via ORAL
  Filled 2022-12-22 (×5): qty 1

## 2022-12-22 MED ORDER — MAGNESIUM SULFATE 2 GM/50ML IV SOLN
2.0000 g | Freq: Once | INTRAVENOUS | Status: AC
  Administered 2022-12-22: 2 g via INTRAVENOUS
  Filled 2022-12-22: qty 50

## 2022-12-22 MED ORDER — LORAZEPAM 1 MG PO TABS
1.0000 mg | ORAL_TABLET | ORAL | Status: AC | PRN
  Administered 2022-12-22: 1 mg via ORAL

## 2022-12-22 MED ORDER — ALBUTEROL SULFATE (2.5 MG/3ML) 0.083% IN NEBU: 2.5000 mg | INHALATION_SOLUTION | RESPIRATORY_TRACT | Status: DC | PRN

## 2022-12-22 MED ORDER — FENTANYL CITRATE PF 50 MCG/ML IJ SOSY
PREFILLED_SYRINGE | INTRAMUSCULAR | Status: AC
  Administered 2022-12-22: 100 ug via INTRAVENOUS
  Filled 2022-12-22: qty 2

## 2022-12-22 MED ORDER — ROSUVASTATIN CALCIUM 10 MG PO TABS
5.0000 mg | ORAL_TABLET | Freq: Every day | ORAL | Status: DC
  Administered 2022-12-22 – 2022-12-29 (×8): 5 mg
  Filled 2022-12-22 (×9): qty 1

## 2022-12-22 MED ORDER — THIAMINE HCL 100 MG/ML IJ SOLN
100.0000 mg | Freq: Once | INTRAMUSCULAR | Status: DC
  Filled 2022-12-22: qty 2

## 2022-12-22 MED ORDER — THIAMINE HCL 100 MG/ML IJ SOLN
500.0000 mg | Freq: Every day | INTRAVENOUS | Status: AC
  Administered 2022-12-22 – 2022-12-25 (×4): 500 mg via INTRAVENOUS
  Filled 2022-12-22 (×4): qty 5

## 2022-12-22 MED ORDER — ETOMIDATE 2 MG/ML IV SOLN
INTRAVENOUS | Status: AC
  Administered 2022-12-22: 20 mg via INTRAVENOUS
  Filled 2022-12-22: qty 10

## 2022-12-22 MED ORDER — FAMOTIDINE IN NACL 20-0.9 MG/50ML-% IV SOLN
20.0000 mg | INTRAVENOUS | Status: DC
  Administered 2022-12-23 – 2022-12-28 (×6): 20 mg via INTRAVENOUS
  Filled 2022-12-22 (×6): qty 50

## 2022-12-22 MED ORDER — MAGNESIUM SULFATE IN D5W 1-5 GM/100ML-% IV SOLN
1.0000 g | Freq: Once | INTRAVENOUS | Status: DC
  Filled 2022-12-22: qty 100

## 2022-12-22 MED ORDER — LORAZEPAM 2 MG/ML IJ SOLN
1.0000 mg | INTRAMUSCULAR | Status: AC | PRN
  Administered 2022-12-22 (×3): 2 mg via INTRAVENOUS
  Administered 2022-12-23: 4 mg via INTRAVENOUS
  Administered 2022-12-23: 2 mg via INTRAVENOUS
  Administered 2022-12-23 – 2022-12-24 (×2): 4 mg via INTRAVENOUS
  Administered 2022-12-24 (×2): 2 mg via INTRAVENOUS
  Administered 2022-12-24: 4 mg via INTRAVENOUS
  Administered 2022-12-25: 2 mg via INTRAVENOUS
  Filled 2022-12-22: qty 2
  Filled 2022-12-22 (×2): qty 1
  Filled 2022-12-22: qty 2
  Filled 2022-12-22 (×4): qty 1
  Filled 2022-12-22: qty 2
  Filled 2022-12-22: qty 1
  Filled 2022-12-22: qty 2

## 2022-12-22 MED ORDER — CHLORDIAZEPOXIDE HCL 25 MG PO CAPS: 25.0000 mg | ORAL_CAPSULE | Freq: Once | ORAL | Status: DC

## 2022-12-22 MED ORDER — THIAMINE HCL 100 MG/ML IJ SOLN
100.0000 mg | Freq: Every day | INTRAMUSCULAR | Status: DC
  Filled 2022-12-22: qty 2

## 2022-12-22 MED ORDER — LORAZEPAM 2 MG/ML IJ SOLN
INTRAMUSCULAR | Status: AC
  Filled 2022-12-22: qty 1

## 2022-12-22 MED ORDER — ORAL CARE MOUTH RINSE: 15.0000 mL | OROMUCOSAL | Status: DC | PRN

## 2022-12-22 MED ORDER — NOREPINEPHRINE 4 MG/250ML-% IV SOLN
2.0000 ug/min | INTRAVENOUS | Status: DC
  Administered 2022-12-22: 2 ug/min via INTRAVENOUS
  Administered 2022-12-24 – 2022-12-25 (×2): 3 ug/min via INTRAVENOUS
  Filled 2022-12-22 (×4): qty 250

## 2022-12-22 NOTE — ED Provider Notes (Signed)
[facility] Provider Note    Event Date/Time   First MD Initiated Contact with Patient 11/27/2022 1308     (approximate)   History   Seizures   HPI  Brett Blake is a 81 y.o. male with a history of cognitive impairment or possible dementia, COPD diabetes delirium tremens   Review of previous discharge summary from November 26 indicates history of Alzheimer's, COPD, diabetes  Patient reports he started shaking all over this morning, could not control it.  He had to call for help.  He reports that he believes he had a seizure.  He is not a great historian, but I did talk with his wife as well.  He reports that he does use alcohol drinks 1 small drink at night before going to bed.  However in speaking with his wife she reports she thinks he has been drinking fairly heavily for the last several days and abruptly stopped drinking about 2 days ago and his stomach seems to get upset.  He denies abdominal pain reports he has had some very mild nausea last couple days.  No fevers or chills no chest pain no shortness of breath.  No headache no fall or injury  Physical Exam   Triage Vital Signs: ED Triage Vitals  Enc Vitals Group     BP 12/23/2022 1110 137/88     Pulse Rate 12/24/2022 1110 (!) 122     Resp 11/25/2022 1110 20     Temp 12/14/2022 1110 97.9 F (36.6 C)     Temp Source 11/27/2022 1110 Oral     SpO2 11/25/2022 1110 100 %     Weight 12/07/2022 1111 171 lb 15.3 oz (78 kg)     Height 12/08/2022 1111 '5\' 4"'$  (1.626 m)     Head Circumference --      Peak Flow --      Pain Score 12/01/2022 1111 8     Pain Loc --      Pain Edu? --      Excl. in [place]? --     Most recent vital signs: Vitals:   12/15/2022 1110 12/01/2022 1433  BP: 137/88 137/88  Pulse: (!) 122 (!) 122  Resp: 20   Temp: 97.9 F (36.6 C)   SpO2: 100%      General: Awake, no distress.  He is somewhat tremulous with high-frequency tremor in both hands and is a little bit anxious. CV:  Good peripheral  perfusion.  Mild tachycardia approximately 1 10-1 20 Resp:  Normal effort.  Clear lung sounds, exhibits an occasional dry cough.  Denies dyspnea Abd:  No distention.  Soft nontender nondistended. Other:     ED Results / Procedures / Treatments   Labs (all labs ordered are listed, but only abnormal results are displayed) Labs Reviewed  BASIC METABOLIC PANEL - Abnormal; Notable for the following components:      Result Value   Potassium 3.1 (*)    Glucose, Bld 138 (*)    Calcium 8.3 (*)    All other components within normal limits  CBC - Abnormal; Notable for the following components:   WBC 3.9 (*)    RBC 3.69 (*)    Hemoglobin 12.1 (*)    HCT 35.5 (*)    RDW 15.7 (*)    Platelets 125 (*)    nRBC 1.0 (*)    All other components within normal limits  MAGNESIUM - Abnormal; Notable for the following components:   Magnesium 1.6 (*)  All other components within normal limits  HEPATIC FUNCTION PANEL - Abnormal; Notable for the following components:   Total Protein 6.4 (*)    Albumin 3.4 (*)    AST 81 (*)    ALT 62 (*)    Bilirubin, Direct 0.3 (*)    All other components within normal limits  RESP PANEL BY RT-PCR (FLU A&B, COVID) ARPGX2  PHOSPHORUS  URINALYSIS, ROUTINE W REFLEX MICROSCOPIC  ETHANOL     EKG  Interpreted by me at 1120 heart rate 120 QRS 85 QTc 500 Sinus tachycardia, occasional slight ectopy or PVC.  Notable artifact likely from underlying tremor   RADIOLOGY  CT Head Wo Contrast, reported by radiology.  Due to a system there I am not able to personally view it but I am able to review the radiologist report at this time  Result Date: 12/04/2022 CLINICAL DATA:  Seizure. EXAM: CT HEAD WITHOUT CONTRAST TECHNIQUE: Contiguous axial images were obtained from the base of the skull through the vertex without intravenous contrast. RADIATION DOSE REDUCTION: This exam was performed according to the departmental dose-optimization program which includes automated  exposure control, adjustment of the mA and/or kV according to patient size and/or use of iterative reconstruction technique. COMPARISON:  Head CT 10/30/2022 FINDINGS: Brain: There is no evidence of an acute infarct, intracranial hemorrhage, mass, midline shift, or extra-axial fluid collection. Calcification in the dorsal left thalamus is unchanged. There is mild cerebral atrophy. Hypodensities in the cerebral white matter bilaterally are unchanged and nonspecific but compatible with mild chronic small vessel ischemic disease. Vascular: Calcified atherosclerosis at the skull base. No suspicious vessel hyperdensity. Skull: No acute fracture or suspicious osseous lesion. Sinuses/Orbits: Minimal mucosal thickening in the paranasal sinuses. Small right mastoid effusion. Bilateral cataract extraction. Other: None. IMPRESSION: 1. No evidence of acute intracranial abnormality. 2. Mild chronic small vessel ischemic disease. Electronically Signed   By: Logan Bores M.D.   On: 11/27/2022 13:45      PROCEDURES:  Critical Care performed: No  Procedures   MEDICATIONS ORDERED IN ED: Medications  LORazepam (ATIVAN) tablet 1-4 mg (has no administration in time range)    Or  LORazepam (ATIVAN) injection 1-4 mg (has no administration in time range)  thiamine (VITAMIN B1) tablet 100 mg (has no administration in time range)    Or  thiamine (VITAMIN B1) injection 100 mg (has no administration in time range)  folic acid (FOLVITE) tablet 1 mg (has no administration in time range)  multivitamin with minerals tablet 1 tablet (has no administration in time range)  famotidine (PEPCID) IVPB 20 mg premix (has no administration in time range)  thiamine (VITAMIN B1) injection 100 mg (has no administration in time range)  LORazepam (ATIVAN) tablet 1 mg (has no administration in time range)  magnesium sulfate IVPB 2 g 50 mL (has no administration in time range)  sodium chloride 0.9 % bolus 1,500 mL (has no administration in  time range)  0.9 %  sodium chloride infusion (has no administration in time range)  levETIRAcetam (KEPPRA) IVPB 1000 mg/100 mL premix (has no administration in time range)  LORazepam (ATIVAN) injection 2 mg (has no administration in time range)  potassium chloride SA (KLOR-CON M) CR tablet 40 mEq (has no administration in time range)  dextromethorphan-guaiFENesin (MUCINEX DM) 30-600 MG per 12 hr tablet 1 tablet (has no administration in time range)  ondansetron (ZOFRAN) injection 4 mg (has no administration in time range)  hydrALAZINE (APRESOLINE) injection 5 mg (has no administration in time  range)  chlordiazePOXIDE (LIBRIUM) capsule 25 mg (has no administration in time range)  albuterol (PROVENTIL) (2.5 MG/3ML) 0.083% nebulizer solution 2.5 mg (has no administration in time range)  enoxaparin (LOVENOX) injection 40 mg (has no administration in time range)     IMPRESSION / MDM / ASSESSMENT AND PLAN / ED COURSE  I reviewed the triage vital signs and the nursing notes.                              Differential diagnosis includes, but is not limited to, possible seizure, possible anxiety, possible mild gastritis is reports he had some mild nausea and also is noted to have a slight cough, viral infection influenza COVID, further conditions are considered.  Lung sounds are clear denies dyspnea, I do not see evidence to suggest that he would have a pneumonia.  Also on clinical history taking his wife reports he actually drinks somewhere about 1/5 or so of hard liquor daily as she has noticed when she is inspected the trash that he has been throwing out liquor bottles, and the history she describes certainly suggest a much higher alcohol intake that he does suggest.  His labs show mild hypokalemia hypomagnesemia leukopenia and also very mild thrombocytopenia.  This could be seen with heavy alcohol use or potentially viral suppression as we are awaiting his viral testing to return  Based on the  clinical history provided his shakes and tachycardia I suspect he actually does have an element of acute alcohol withdrawal which may have triggered a seizure or seizure-like episode this morning  Withdrawal score is equal to 4.  Will give 1 mg of Ativan and continue to follow.  Consulted with her hospitalist Dr. Blaine Hamper, patient will be admitted to the hospitalist service for ongoing care and treatment.  COVID test pending at this time  Patient's presentation is most consistent with acute complicated illness / injury requiring diagnostic workup.   The patient is on the cardiac monitor to evaluate for evidence of arrhythmia and/or significant heart rate changes.  Patient is understanding of plan for admission.  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   Delman Kitten, MD 12/06/2022 1530

## 2022-12-22 NOTE — ED Triage Notes (Signed)
Pt reports had a seizure this am. Pt reports hx of the same but has not had one in a really long time. Pt also reports does not take medication for them either.

## 2022-12-22 NOTE — Procedures (Signed)
Endotracheal Intubation: Patient required placement of an artificial airway secondary to mechanical intubation for airway protection    Consent: Emergent.    Hand washing performed prior to starting the procedure.    Medications administered for sedation prior to procedure:  Fentanyl 100 mcg IV, 20 mg Etomidate IV, Rocuronium 80 mg IV     A time out procedure was called and correct patient, name, & ID confirmed. Needed supplies and equipment were assembled and checked to include ETT, 10 ml syringe, Glidescope, Mac and Miller blades, suction, oxygen and bag mask valve, end tidal CO2 monitor.    Patient was positioned to align the mouth and pharynx to facilitate visualization of the glottis.    Heart rate, SpO2 and blood pressure was continuously monitored during the procedure. Pre-oxygenation was conducted prior to intubation and endotracheal tube was placed through the vocal cords into the trachea.       The artificial airway was placed under direct visualization via glidescope route using a 7.5cm ETT on the second attempt.   ETT was secured at 25cm.   Placement was confirmed by auscuitation of lungs with good breath sounds bilaterally and no stomach sounds.  Condensation was noted on endotracheal tube.   Pulse ox 98% CO2 detector in place with appropriate color change.    Complications: None .        Chest radiograph ordered and pending.   Donell Beers, [place] Pager [phone] (please enter 7 digits) PCCM Consult Pager [phone] (please enter 7 digits)

## 2022-12-22 NOTE — ED Notes (Signed)
XR at bedside

## 2022-12-22 NOTE — ED Triage Notes (Signed)
First Nurse Note:  Arrives from home via [facility].  Hx non epileptic seizures.  EMS called for seizure this morning.  Does not take medication for seizures.  Patient is AAOx3.  Skin warm and dry.  Having some tremors to extremities.  CBG:  154.  VS wnl.  P:  110's

## 2022-12-22 NOTE — ED Notes (Addendum)
The pt was attempting to sit up at the end of the bed. The pt was instructed numerous times to sit back in the bed due to him being at risk for falling. The pt had already been administered 1 mg of ativan. Dr. Donna Bernard was advised of the pt's behavior and put in the order for the pt to receive 2 mg more of ativan. The pt was administered the ativan IV push. The pt continued to try to get out of the bed and began pulling his brief off and swinging at the staff. The pt was attempting to bite and hit staff. The nurse techs placed the pt in gloves. Dr. Blaine Hamper was here in flex when he was asked about the pt receiving more medication due to his agitation. Dr. Blaine Hamper gave the verbal order for 3 mg of Haldol. After I had returned with the Haldol the pt had pulled out his IV so the medication was administered IM. ED tech Abigail called charge nurse Nira Conn and made her aware of the pt's behavior. ED nurse Nira Conn came over to flex and assisted [place] with transporting the pt to ED room 25. The pt care and report were transferred to ED nurse Caryl Pina.

## 2022-12-22 NOTE — H&P (Signed)
History and Physical    Brett Blake RJJ:[id number] DOB: [birth year] DOA: 12/12/2022  Referring MD/NP/PA:   PCP: [name], MD   Patient coming from:  The patient is coming from home.    Chief Complaint: seizure  HPI: Brett Blake is a 81 y.o. male with medical history significant of alcohol abuse, hypertension, hyperlipidemia, diabetes mellitus, COPD, dementia, depression with anxiety, pseudoseizure, former smoker, who presents with seizure  Pt states that he had " nonepileptic seizure" this morning, which is lasted for few minutes.  He described as whole body shaking.  No unilateral numbness or tinglings extremities.  No facial droop or slurred speech.  Patient stopped drinking possibly 2 days ago.  Patient has cough with little mucus production, has mild shortness breath.  He also reports chest discomfort after having seizure.  Patient does not have nausea vomiting.  He states that he has diarrhea with 3-4 times of loose stool bowel movement each day.  No nausea vomiting.  Patient has upper abdominal discomfort.  No symptoms of UTI.  Addendum: Initially patient was calm. In the late afternoon, patient developed agitation and becomes confused.  Patient received several doses of Ativan, but still has agitation.   Data reviewed independently and ED Course: pt was found to have WBC 0.9, negative COVID PCR and flu PCR, potassium 3.1, GFR> 60, abnormal liver function (ALP 85, AST 81, ALT 62, total bilirubin 0.9), temperature normal, blood pressure 137/88, heart rate of 122, RR 20, oxygen saturation 100% on room air.  Chest x-ray negative.  CT of head is negative.  X-ray of right forearm is negative for bony fracture.   EKG: I have personally reviewed.  Sinus rhythm, QTc 495, artificial effects, poor R wave progression   Review of Systems:   General: no fevers, chills, no body weight gain, has fatigue HEENT: no blurry vision, hearing changes or sore throat Respiratory: has  dyspnea, coughing, no wheezing CV: no chest pain, no palpitations GI: no nausea, vomiting, abdominal pain, diarrhea, constipation GU: no dysuria, burning on urination, increased urinary frequency, hematuria  Ext: no leg edema Neuro: no unilateral weakness, numbness, or tingling, no vision change or hearing loss. Has agitation and confusion Skin: Patient has bruise in right forearm and right lower leg MSK: No muscle spasm, no deformity, no limitation of range of movement in spin Heme: No easy bruising.  Travel history: No recent long distant travel.   Allergy:  Allergies  Allergen Reactions   Ace Inhibitors Cough   Metoprolol Diarrhea    Patient has been tolerating Propranolol 40 mg twice daily since at least 05/2022.   Nsaids Rash   Sulfa Antibiotics Rash    Past Medical History:  Diagnosis Date   Anxiety    Arthritis    Asthma    Colon polyps    COPD (chronic obstructive pulmonary disease) ([place])    Depression    Diabetes ([place])    Difficult intubation    GERD (gastroesophageal reflux disease)    High cholesterol    Hyperlipidemia    Hypertension    Hyponatremia 02/23/2022   Sleep apnea     Past Surgical History:  Procedure Laterality Date   BRONCHOSCOPY     CARDIAC CATHETERIZATION Left 11/04/2016   Procedure: Left Heart Cath and Coronary Angiography;  Surgeon: Teodoro Spray, MD;  Location: [facility];  Service: Cardiovascular;  Laterality: Left;   CARDIAC CATHETERIZATION     CHOLECYSTECTOMY N/A 05/09/2019   Procedure: LAPAROSCOPIC CHOLECYSTECTOMY  WITH INTRAOPERATIVE CHOLANGIOGRAM;  Surgeon: Jules Husbands, MD;  Location: [facility];  Service: General;  Laterality: N/A;   COLOSTOMY     IR KYPHO LUMBAR INC FX REDUCE BONE BX UNI/BIL CANNULATION INC/IMAGING  11/10/2022   IR RADIOLOGIST EVAL & MGMT  11/08/2022   LEFT HEART CATH AND CORONARY ANGIOGRAPHY N/A 12/14/2021   Procedure: LEFT HEART CATH AND CORONARY ANGIOGRAPHY;  Surgeon: Corey Skains, MD;  Location:  [facility];  Service: Cardiovascular;  Laterality: N/A;   ROTATOR CUFF REPAIR Left    THUMB ARTHROSCOPY  2015   TONSILLECTOMY  1952   TONSILLECTOMY      Social History:  reports that he has quit smoking. His smoking use included cigarettes. He has a 15.00 pack-year smoking history. He has never used smokeless tobacco. He reports current alcohol use of about 1.0 - 2.0 standard drink of alcohol per week. He reports that he does not use drugs.  Family History:  Family History  Problem Relation Age of Onset   Heart attack Mother    Prostate cancer Father    Leukemia Brother    Prostate cancer Brother      Prior to Admission medications   Medication Sig Start Date End Date Taking? Authorizing Provider  albuterol (ACCUNEB) 1.25 MG/3ML nebulizer solution Take 3 mLs by nebulization every 6 (six) hours as needed for wheezing. 11/20/22   Loletha Grayer, MD  ALPRAZolam Duanne Moron) 0.5 MG tablet Take 0.5 mg by mouth daily as needed for anxiety. 10/06/22 10/06/23  [provider]  aspirin EC 81 MG tablet Take 81 mg by mouth daily. Swallow whole.    [provider]  azithromycin (ZITHROMAX) 250 MG tablet One tab po daily for three days 11/21/22   Loletha Grayer, MD  diltiazem (CARDIZEM CD) 240 MG 24 hr capsule Take 240 mg by mouth every morning. 12/07/21   [provider]  donepezil (ARICEPT) 5 MG tablet Take 5 mg by mouth at bedtime. 05/10/22   [provider]  fluticasone (FLONASE) 50 MCG/ACT nasal spray Place 1 spray into both nostrils daily as needed for allergies or rhinitis.    [provider]  folic acid (FOLVITE) 1 MG tablet Take 1 tablet (1 mg total) by mouth daily. 03/01/22   Loletha Grayer, MD  gabapentin (NEURONTIN) 300 MG capsule Take 1 capsule (300 mg total) by mouth at bedtime. 02/28/22   Loletha Grayer, MD  guaiFENesin (ROBITUSSIN) 100 MG/5ML liquid Take 5 mLs by mouth every 4 (four) hours as needed for cough or to loosen phlegm.  02/28/22   Loletha Grayer, MD  melatonin 5 MG TABS Take 1 tablet (5 mg total) by mouth at bedtime. 02/28/22   Loletha Grayer, MD  nitroGLYCERIN (NITROSTAT) 0.4 MG SL tablet Place 0.4 mg under the tongue every 5 (five) minutes as needed for chest pain.    [provider]  omeprazole (PRILOSEC) 40 MG capsule Take 40 mg by mouth daily. 04/20/22   [provider]  ondansetron (ZOFRAN-ODT) 4 MG disintegrating tablet Take 1 tablet (4 mg total) by mouth every 8 (eight) hours as needed for nausea or vomiting. 06/11/22   Carrie Mew, MD  polyethylene glycol (MIRALAX / GLYCOLAX) 17 g packet Take 17 g by mouth daily as needed for moderate constipation. Patient not taking: Reported on 11/08/2022 02/28/22   Loletha Grayer, MD  propranolol (INDERAL) 40 MG tablet Take 40 mg by mouth 2 (two) times daily. 11/07/22   [provider]  QUEtiapine (SEROQUEL) 25  MG tablet One tab po qhs (can take extra dose if not sleeping) 02/28/22   Loletha Grayer, MD  rosuvastatin (CRESTOR) 5 MG tablet Take 5 mg by mouth daily. 05/10/22   [provider]  SYMBICORT 80-4.5 MCG/ACT inhaler Inhale 2 puffs into the lungs 2 (two) times daily as needed.  05/19/20   [provider]  venlafaxine XR (EFFEXOR-XR) 37.5 MG 24 hr capsule Take 37.5 mg by mouth daily. 12/07/21   [provider]    Physical Exam: Vitals:   12/21/2022 1110 12/17/2022 1111 11/26/2022 1433  BP: 137/88  137/88  Pulse: (!) 122  (!) 122  Resp: 20    Temp: 97.9 F (36.6 C)    TempSrc: Oral    SpO2: 100%    Weight:  78 kg   Height:  '5\' 4"'$  (1.626 m)    General: Not in acute distress HEENT:       Eyes: PERRL, EOMI, no scleral icterus.       ENT: No discharge from the ears and nose       Neck: No JVD, no bruit, no mass felt. Heme: No neck lymph node enlargement. Cardiac: S1/S2, RRR, No murmurs, No gallops or rubs. Respiratory: No rales, wheezing, rhonchi or rubs. GI: Soft, nondistended, nontender, no  organomegaly, BS present. GU: No hematuria Ext: No pitting leg edema bilaterally. 1+DP/PT pulse bilaterally. Musculoskeletal: No joint deformities, No joint redness or warmth, no limitation of ROM in spin. Skin: Has a bruise in right forearm and right leg shin area Neuro: Initially patient was alert and calm, and oriented x 3, in late afternoon developed confusion and agitation, not following command.  Cranial nerves II-XII grossly intact, moves all extremities normally. Psych: Patient is not psychotic, no suicidal or hemocidal ideation.  Labs on Admission: I have personally reviewed following labs and imaging studies  CBC: Recent Labs  Lab 11/26/2022 1150  WBC 3.9*  HGB 12.1*  HCT 35.5*  MCV 96.2  PLT 833*   Basic Metabolic Panel: Recent Labs  Lab 12/21/2022 1149 11/28/2022 1150  NA  --  139  K  --  3.1*  CL  --  101  CO2  --  24  GLUCOSE  --  138*  BUN  --  15  CREATININE  --  1.13  CALCIUM  --  8.3*  MG 1.6*  --   PHOS  --  3.1   GFR: Estimated Creatinine Clearance: 49.2 mL/min (by C-G formula based on SCr of 1.13 mg/dL). Liver Function Tests: Recent Labs  Lab 12/08/2022 1150  AST 81*  ALT 62*  ALKPHOS 85  BILITOT 1.2  PROT 6.4*  ALBUMIN 3.4*   No results for input(s): "LIPASE", "AMYLASE" in the last 168 hours. No results for input(s): "AMMONIA" in the last 168 hours. Coagulation Profile: No results for input(s): "INR", "PROTIME" in the last 168 hours. Cardiac Enzymes: No results for input(s): "CKTOTAL", "CKMB", "CKMBINDEX", "TROPONINI" in the last 168 hours. BNP (last 3 results) No results for input(s): "PROBNP" in the last 8760 hours. HbA1C: No results for input(s): "HGBA1C" in the last 72 hours. CBG: No results for input(s): "GLUCAP" in the last 168 hours. Lipid Profile: No results for input(s): "CHOL", "HDL", "LDLCALC", "TRIG", "CHOLHDL", "LDLDIRECT" in the last 72 hours. Thyroid Function Tests: No results for input(s): "TSH", "T4TOTAL", "FREET4",  "T3FREE", "THYROIDAB" in the last 72 hours. Anemia Panel: No results for input(s): "VITAMINB12", "FOLATE", "FERRITIN", "TIBC", "IRON", "RETICCTPCT" in the last 72 hours. Urine analysis:  Component Value Date/Time   COLORURINE YELLOW (A) 06/11/2022 1224   APPEARANCEUR CLEAR (A) 06/11/2022 1224   LABSPEC 1.036 (H) 06/11/2022 1224   PHURINE 6.0 06/11/2022 1224   GLUCOSEU NEGATIVE 06/11/2022 1224   HGBUR NEGATIVE 06/11/2022 1224   BILIRUBINUR NEGATIVE 06/11/2022 1224   KETONESUR NEGATIVE 06/11/2022 1224   PROTEINUR NEGATIVE 06/11/2022 1224   NITRITE NEGATIVE 06/11/2022 1224   LEUKOCYTESUR NEGATIVE 06/11/2022 1224   Sepsis Labs: '@LABRCNTIP'$ (procalcitonin:4,lacticidven:4) ) Recent Results (from the past 240 hour(s))  Resp Panel by RT-PCR (Flu A&B, Covid) Anterior Nasal Swab     Status: None   Collection Time: 12/09/2022  1:14 PM   Specimen: Anterior Nasal Swab  Result Value Ref Range Status   SARS Coronavirus 2 by RT PCR NEGATIVE NEGATIVE Final    Comment: (NOTE) SARS-CoV-2 target nucleic acids are NOT DETECTED.  The SARS-CoV-2 RNA is generally detectable in upper respiratory specimens during the acute phase of infection. The lowest concentration of SARS-CoV-2 viral copies this assay can detect is 138 copies/mL. A negative result does not preclude SARS-Cov-2 infection and should not be used as the sole basis for treatment or other patient management decisions. A negative result may occur with  improper specimen collection/handling, submission of specimen other than nasopharyngeal swab, presence of viral mutation(s) within the areas targeted by this assay, and inadequate number of viral copies(<138 copies/mL). A negative result must be combined with clinical observations, patient history, and epidemiological information. The expected result is Negative.  Fact Sheet for Patients:  EntrepreneurPulse.com.au  Fact Sheet for Healthcare Providers:   IncredibleEmployment.be  This test is no t yet approved or cleared by the [place] FDA and  has been authorized for detection and/or diagnosis of SARS-CoV-2 by FDA under an Emergency Use Authorization (EUA). This EUA will remain  in effect (meaning this test can be used) for the duration of the COVID-19 declaration under Section 564(b)(1) of the Act, 21 U.S.C.section 360bbb-3(b)(1), unless the authorization is terminated  or revoked sooner.       Influenza A by PCR NEGATIVE NEGATIVE Final   Influenza B by PCR NEGATIVE NEGATIVE Final    Comment: (NOTE) The Xpert Xpress SARS-CoV-2/FLU/RSV plus assay is intended as an aid in the diagnosis of influenza from Nasopharyngeal swab specimens and should not be used as a sole basis for treatment. Nasal washings and aspirates are unacceptable for Xpert Xpress SARS-CoV-2/FLU/RSV testing.  Fact Sheet for Patients: EntrepreneurPulse.com.au  Fact Sheet for Healthcare Providers: IncredibleEmployment.be  This test is not yet approved or cleared by the [place] FDA and has been authorized for detection and/or diagnosis of SARS-CoV-2 by FDA under an Emergency Use Authorization (EUA). This EUA will remain in effect (meaning this test can be used) for the duration of the COVID-19 declaration under Section 564(b)(1) of the Act, 21 U.S.C. section 360bbb-3(b)(1), unless the authorization is terminated or revoked.  Performed at [facility], [address] [place], [place] [address]      Radiological Exams on Admission: DG Forearm Right  Result Date: 11/25/2022 CLINICAL DATA:  Right forearm bruising, pain EXAM: RIGHT FOREARM - 2 VIEW COMPARISON:  None Available. FINDINGS: Frontal and lateral views of the right forearm are obtained of 3 images. There are no acute displaced fractures. Alignment is anatomic. Osteoarthritis of the right elbow, greatest laterally. Soft tissues  are grossly normal. IMPRESSION: 1. No acute fracture. 2. Right elbow osteoarthritis. Electronically Signed   By: Randa Ngo M.D.   On: 12/15/2022 16:32   DG  Chest 2 View  Result Date: 11/30/2022 CLINICAL DATA:  Seizure this morning.  Productive cough. EXAM: CHEST - 2 VIEW COMPARISON:  11/18/2022 FINDINGS: Heart size is normal. Low lung volumes. No focal consolidations or pleural effusions. LEFT LOWER lobe granuloma. No pneumothorax or acute fracture. IMPRESSION: No active cardiopulmonary disease. Electronically Signed   By: Nolon Nations M.D.   On: 12/24/2022 16:29   CT Head Wo Contrast  Result Date: 12/21/2022 CLINICAL DATA:  Seizure. EXAM: CT HEAD WITHOUT CONTRAST TECHNIQUE: Contiguous axial images were obtained from the base of the skull through the vertex without intravenous contrast. RADIATION DOSE REDUCTION: This exam was performed according to the departmental dose-optimization program which includes automated exposure control, adjustment of the mA and/or kV according to patient size and/or use of iterative reconstruction technique. COMPARISON:  Head CT 10/30/2022 FINDINGS: Brain: There is no evidence of an acute infarct, intracranial hemorrhage, mass, midline shift, or extra-axial fluid collection. Calcification in the dorsal left thalamus is unchanged. There is mild cerebral atrophy. Hypodensities in the cerebral white matter bilaterally are unchanged and nonspecific but compatible with mild chronic small vessel ischemic disease. Vascular: Calcified atherosclerosis at the skull base. No suspicious vessel hyperdensity. Skull: No acute fracture or suspicious osseous lesion. Sinuses/Orbits: Minimal mucosal thickening in the paranasal sinuses. Small right mastoid effusion. Bilateral cataract extraction. Other: None. IMPRESSION: 1. No evidence of acute intracranial abnormality. 2. Mild chronic small vessel ischemic disease. Electronically Signed   By: Logan Bores M.D.   On: 12/23/2022 13:45       Assessment/Plan Principal Problem:   Seizure ([place]) Active Problems:   Alcohol withdrawal ([facility])   Essential hypertension   COPD (chronic obstructive pulmonary disease) ([facility])   HLD (hyperlipidemia)   Diabetes mellitus without complication ([facility])   Thrombocytopenia ([facility])   Hypokalemia   Hypomagnesemia   Abnormal LFTs   Dementia ([facility])   Assessment and Plan:  Seizure ([place]): Possibly due to alcohol withdrawal.  CT head negative.  Patient has history of pseudoseizures.  -Patient is admitted to ICU due to severe alcohol withdrawal -Seizure precaution -1 g of Keppra was ordered -As needed Ativan for seizure -Frequent neurocheck  Alcohol withdrawal ([place]): Initially patient has mild alcohol withdrawal, started CIWA protocol.  Patient was given several doses of Ativan, but developed severe agitation and severe alcohol withdrawal in the late afternoon.  Consulted Dr. Lanney Gins of ICU.  Patient is intubated and transferred to ICU. -Ordered Precedex drip -Frequent neurocheck  Essential hypertension - IV hydralazine as needed -Hold oral medications since patient is intubated  COPD (chronic obstructive pulmonary disease) (H[facility]) -Bronchodilators  HLD (hyperlipidemia) -Hold oral medications, Crestor  Diabetes mellitus without complication [facility]): Patient seems to be not taking medications currently.  Recent A1c 7.4.  Blood sugar 138 -Check CBG every morning  Thrombocytopenia ([place]): Platelet 125, possibly due to alcohol abuse -Follow-up with CBC  Hypokalemia and Hypomagnesemia: Magnesium 1.6.  Potassium 3.1 -Repleted both potassium and magnesium -Check phosphorus level  Abnormal LFTs: Patient has some mild abnormal liver function, possibly due to alcohol abuse -Avoid using Tylenol  Dementia ([place]): -Hold oral occasions: Donepezil    DVT ppx: SQ Lovenox  Code Status: Full code per his wife  Family Communication: Yes, patient's wife    at bed side.     Disposition Plan:   Anticipate discharge back to previous environment  Consults called:  Dr. Lanney Gins of ICU  Admission status and Level of care: ICU:    as inpt     Dispo: The  patient is from: Home              Anticipated d/c is to: Home              Anticipated d/c date is: 2 days              Patient currently is not medically stable to d/c.    Severity of Illness:  The appropriate patient status for this patient is INPATIENT. Inpatient status is judged to be reasonable and necessary in order to provide the required intensity of service to ensure the patient's safety. The patient's presenting symptoms, physical exam findings, and initial radiographic and laboratory data in the context of their chronic comorbidities is felt to place them at high risk for further clinical deterioration. Furthermore, it is not anticipated that the patient will be medically stable for discharge from the hospital within 2 midnights of admission.   * I certify that at the point of admission it is my clinical judgment that the patient will require inpatient hospital care spanning beyond 2 midnights from the point of admission due to high intensity of service, high risk for further deterioration and high frequency of surveillance required.*       Date of Service 12/16/2022    [facility]   If 7PM-7AM, please contact night-coverage [email or web address] 12/17/2022, 6:27 PM

## 2022-12-22 NOTE — ED Notes (Signed)
Lab called by this RN to get phlebotomy's assistance. Stated they will stop by soon.

## 2022-12-22 NOTE — ED Notes (Signed)
This Tech,Abigail, and Keke changed patient's clothes and linen.

## 2022-12-22 NOTE — H&P (Signed)
NAME:  Brett Blake, MRN:  [id number], DOB:  1941/07/27, LOS: 0 ADMISSION DATE:  12/13/2022, CONSULTATION DATE: 12/25/2022 REFERRING MD: Dr. Blaine Hamper, CHIEF COMPLAINT: AMS   History of Present Illness:  This is an 81 yo male with hx of ETOH Abuse who presented to [facility] ER on 12/28 via EMS from home following a possible seizure activity the morning of 12/28.  He has a hx of non epileptic seizures, however he does not take antiepileptics.  He reported he has not had a seizure in a long time.  All hx obtained from pts chart and daughter.   ED Course Upon arrival to the ER pt noted to have severe tremors.  Pt reported he drinks 1 small alcoholic beverage every night prior to going to bed.  However, pts wife reported he drank hard liquor heavily over the past several days and abruptly stopped drinking 2 days ago.  He also complained of an upset stomach.  ER lab results revealed K+ 3.1/glucose 138/calcium 8.3/albumin 3.4/AST 81/ALT 62/total protein 6.4/magnesium 1.6/wbc 3.9/hgb 12.1/platelets 125.  COVID-19/Influenza A&B negative.  Pt had a CIWA score of 4, he received 1 mg of iv ativan.  Hospitalist initially contacted for hospital admission.  However, pt became increasingly agitated and combative despite receiving a total of 3 mg of iv ativan and 3 mg of IM haldol.  Security called to bedside.  PCCM team consulted due to pt requiring precedex gtt.  However, despite precedex gtt at maximal dose; additional 2 mg of iv ativan, 12.5 mg of iv fentanyl, and 2.5 mg of iv haldol pt remained combative/agitated requiring mechanical intubation.  Pt admitted to ICU per PCCM team for additional workup and treatment.    CT Head 12/28:  No evidence of acute intracranial abnormality. Mild chronic small vessel ischemic disease. CXR 12/28: No active cardiopulmonary disease.   Pertinent  Medical History  Anxiety  Arthritis  Asthma  Colon Polyps  COPD Depression  Type II Diabetes Mellitus Difficult Intubation  GERD   Hypercholesteremia  HLD HTN  Hyponatremia  OSA   Significant Hospital Events: Including procedures, antibiotic start and stop dates in addition to other pertinent events   12/28: Pt admitted with possible seizure activity suspect secondary to severe ETOH withdrawal requiring mechanical intubation   Interim History / Subjective:  Pt mechanically intubated RASS -4 following administration of intubation medications.  Pt initially hypertensive post intubation, however later became hypotensive secondary to sedating medications levophed gtt ordered     Objective   Blood pressure 137/88, pulse (!) 122, temperature 97.9 F (36.6 C), temperature source Oral, resp. rate 20, height _0  (1.626 m), weight 78 kg, SpO2 100 %.       No intake or output data in the 24 hours ending 12/17/2022 1826 Filed Weights   12/24/2022 1111  Weight: 78 kg    Examination: General: Acutely-ill appearing male, NAD mechanically intubated HENT: Supple, difficult to assess for JVD due to body habitus  Lungs: Diminished with expiratory wheezes, even, non labored  Cardiovascular: Sinus tachycardia, s1s2, no r/g, 2+ radial/1+ distal, no edema  Abdomen: +BS x4, obese, soft, non distended  Extremities: Normal bulk and tone, moves all extremities  Neuro: Sedated, not following commands, PERRL  GU: Indwelling foley catheter draining blood tinged urine   Resolved Hospital Problem list     Assessment & Plan:  Mechanical intubation for airway protection in the setting of ETOH withdrawal  Hx: OSA, COPD, Asthma, and Difficult Intubation  - Full vent support  for now: vent settings reviewed and established - Continue lung protective strategies - SBT once all parameters met  - IV steroids for airway edema in the setting of difficult intubation  - VAP bundle implemented  - Scheduled and prn bronchodilator therapy  - Nebulized steroids   HTN  Hx: Hypercholesteremia  - Continuous telemetry monitoring  - Check  troponin's - Continue outpatient rosuvastatin   Hypokalemia  Hyponatremia  - Trend BMP  - Replace electrolytes as indicated  - Monitor UOP - Avoid nephrotoxic medications   Type II diabetes mellitus  - CBG's q4hrs  - SSI   Possible seizure activity in the setting of ETOH withdrawal  - Loading dose of keppra followed by maintenance keppra  - Seizure precautions  - Maintain RASS goal -2 to -3 - Propofol and fentanyl gtt to maintain RASS goal  - WUA daily when appropriate  - EEG pending   Best Practice (right click and "Reselect all SmartList Selections" daily)   Diet/type: NPO DVT prophylaxis: LMWH GI prophylaxis: H2B Lines: N/A Foley:  Yes, and it is still needed Code Status:  full code Last date of multidisciplinary goals of care discussion [N/A]  Labs   CBC: Recent Labs  Lab 12/19/2022 1150  WBC 3.9*  HGB 12.1*  HCT 35.5*  MCV 96.2  PLT 125*    Basic Metabolic Panel: Recent Labs  Lab 12/12/2022 1149 12/13/2022 1150  NA  --  139  K  --  3.1*  CL  --  101  CO2  --  24  GLUCOSE  --  138*  BUN  --  15  CREATININE  --  1.13  CALCIUM  --  8.3*  MG 1.6*  --   PHOS  --  3.1   GFR: Estimated Creatinine Clearance: 49.2 mL/min (by C-G formula based on SCr of 1.13 mg/dL). Recent Labs  Lab 12/16/2022 1150  WBC 3.9*    Liver Function Tests: Recent Labs  Lab 12/07/2022 1150  AST 81*  ALT 62*  ALKPHOS 85  BILITOT 1.2  PROT 6.4*  ALBUMIN 3.4*   No results for input(s): "LIPASE", "AMYLASE" in the last 168 hours. No results for input(s): "AMMONIA" in the last 168 hours.  ABG No results found for: "PHART", "PCO2ART", "PO2ART", "HCO3", "TCO2", "ACIDBASEDEF", "O2SAT"   Coagulation Profile: No results for input(s): "INR", "PROTIME" in the last 168 hours.  Cardiac Enzymes: No results for input(s): "CKTOTAL", "CKMB", "CKMBINDEX", "TROPONINI" in the last 168 hours.  HbA1C: Hgb A1c MFr Bld  Date/Time Value Ref Range Status  11/20/2022 05:04 AM 7.4 (H) 4.8 -  5.6 % Final    Comment:    (NOTE)         Prediabetes: 5.7 - 6.4         Diabetes: >6.4         Glycemic control for adults with diabetes: <7.0   02/15/2022 05:57 AM 5.9 (H) 4.8 - 5.6 % Final    Comment:    (NOTE)         Prediabetes: 5.7 - 6.4         Diabetes: >6.4         Glycemic control for adults with diabetes: <7.0     CBG: No results for input(s): "GLUCAP" in the last 168 hours.  Review of Systems:   Unable to assess pt mechanically intubated   Past Medical History:  He,  has a past medical history of Anxiety, Arthritis, Asthma, Colon polyps, COPD (chronic obstructive pulmonary  disease) ([place]), Depression, Diabetes ([place]), Difficult intubation, GERD (gastroesophageal reflux disease), High cholesterol, Hyperlipidemia, Hypertension, Hyponatremia (02/23/2022), and Sleep apnea.   Surgical History:   Past Surgical History:  Procedure Laterality Date   BRONCHOSCOPY     CARDIAC CATHETERIZATION Left 11/04/2016   Procedure: Left Heart Cath and Coronary Angiography;  Surgeon: Teodoro Spray, MD;  Location: [place] [facility];  Service: Cardiovascular;  Laterality: Left;   CARDIAC CATHETERIZATION     CHOLECYSTECTOMY N/A 05/09/2019   Procedure: LAPAROSCOPIC CHOLECYSTECTOMY WITH INTRAOPERATIVE CHOLANGIOGRAM;  Surgeon: Jules Husbands, MD;  Location: [facility];  Service: General;  Laterality: N/A;   COLOSTOMY     IR KYPHO LUMBAR INC FX REDUCE BONE BX UNI/BIL CANNULATION INC/IMAGING  11/10/2022   IR RADIOLOGIST EVAL & MGMT  11/08/2022   LEFT HEART CATH AND CORONARY ANGIOGRAPHY N/A 12/14/2021   Procedure: LEFT HEART CATH AND CORONARY ANGIOGRAPHY;  Surgeon: Corey Skains, MD;  Location: [place] [facility];  Service: Cardiovascular;  Laterality: N/A;   ROTATOR CUFF REPAIR Left    THUMB ARTHROSCOPY  2015   TONSILLECTOMY  1952   TONSILLECTOMY       Social History:   reports that he has quit smoking. His smoking use included cigarettes. He has a 15.00 pack-year smoking history. He  has never used smokeless tobacco. He reports current alcohol use of about 1.0 - 2.0 standard drink of alcohol per week. He reports that he does not use drugs.   Family History:  His family history includes Heart attack in his mother; Leukemia in his brother; Prostate cancer in his brother and father.   Allergies Allergies  Allergen Reactions   Ace Inhibitors Cough   Metoprolol Diarrhea    Patient has been tolerating Propranolol 40 mg twice daily since at least 05/2022.   Nsaids Rash   Sulfa Antibiotics Rash     Home Medications  Prior to Admission medications   Medication Sig Start Date End Date Taking? Authorizing Provider  albuterol (ACCUNEB) 1.25 MG/3ML nebulizer solution Take 3 mLs by nebulization every 6 (six) hours as needed for wheezing. 11/20/22  Yes Wieting, Richard, MD  ALPRAZolam Duanne Moron) 0.5 MG tablet Take 0.5 mg by mouth daily as needed for anxiety. 10/06/22 10/06/23 Yes [provider]  aspirin EC 81 MG tablet Take 81 mg by mouth daily. Swallow whole.   Yes [provider]  diltiazem (CARDIZEM CD) 240 MG 24 hr capsule Take 240 mg by mouth every morning. 12/07/21  Yes [provider]  donepezil (ARICEPT) 5 MG tablet Take 5 mg by mouth at bedtime. 05/10/22  Yes [provider]  fluticasone (FLONASE) 50 MCG/ACT nasal spray Place 1 spray into both nostrils daily as needed for allergies or rhinitis.   Yes [provider]  folic acid (FOLVITE) 1 MG tablet Take 1 tablet (1 mg total) by mouth daily. 03/01/22  Yes Wieting, Richard, MD  gabapentin (NEURONTIN) 300 MG capsule Take 1 capsule (300 mg total) by mouth at bedtime. 02/28/22  Yes Wieting, Richard, MD  melatonin 5 MG TABS Take 1 tablet (5 mg total) by mouth at bedtime. 02/28/22  Yes Wieting, Richard, MD  nitroGLYCERIN (NITROSTAT) 0.4 MG SL tablet Place 0.4 mg under the tongue every 5 (five) minutes as needed for chest pain.   Yes [provider]  omeprazole (PRILOSEC) 40 MG capsule  Take 40 mg by mouth daily. 04/20/22  Yes [provider]  ondansetron (ZOFRAN-ODT) 4 MG disintegrating tablet Take 1 tablet (4 mg total)  by mouth every 8 (eight) hours as needed for nausea or vomiting. 06/11/22  Yes Carrie Mew, MD  propranolol (INDERAL) 40 MG tablet Take 40 mg by mouth 2 (two) times daily. 11/07/22  Yes [provider]  QUEtiapine (SEROQUEL) 25 MG tablet One tab po qhs (can take extra dose if not sleeping) 02/28/22  Yes Wieting, Richard, MD  rosuvastatin (CRESTOR) 5 MG tablet Take 5 mg by mouth daily. 05/10/22  Yes [provider]  SYMBICORT 80-4.5 MCG/ACT inhaler Inhale 2 puffs into the lungs 2 (two) times daily as needed.  05/19/20  Yes [provider]  venlafaxine XR (EFFEXOR-XR) 37.5 MG 24 hr capsule Take 37.5 mg by mouth daily. 12/07/21  Yes [provider]  azithromycin (ZITHROMAX) 250 MG tablet One tab po daily for three days Patient not taking: Reported on 11/29/2022 11/21/22   Loletha Grayer, MD  guaiFENesin (ROBITUSSIN) 100 MG/5ML liquid Take 5 mLs by mouth every 4 (four) hours as needed for cough or to loosen phlegm. Patient not taking: Reported on 12/03/2022 02/28/22   Loletha Grayer, MD  polyethylene glycol (MIRALAX / GLYCOLAX) 17 g packet Take 17 g by mouth daily as needed for moderate constipation. Patient not taking: Reported on 11/08/2022 02/28/22   Loletha Grayer, MD     Critical care time: 35 minutes     Donell Beers, [place] Pager [phone] (please enter 7 digits) PCCM Consult Pager [phone] (please enter 7 digits)

## 2022-12-22 NOTE — ED Notes (Signed)
Patient pulled off brief and gown. Pt very agitated and swinging his arms around. Patient pulled out his IV. RN made aware.

## 2022-12-22 NOTE — ED Notes (Addendum)
Pt transported from Flex area, pt is escorted with multiple security and nursing staff at bedside. Pt is extremely combative and uncooperative. Pt is no redirectable at this time. Pt roomed to 25, security still at bedside. Dr. Blaine Hamper at bedside at this time.   Hinton Dyer, ICU NP at bedside at this time.   Pt has multiple skin tears to bilateral arms, this RN wrapped post-intubation with saline soaked gauze. Pt also has bruising to bilateral legs

## 2022-12-23 DIAGNOSIS — R569 Unspecified convulsions: Secondary | ICD-10-CM | POA: Diagnosis not present

## 2022-12-23 LAB — BLOOD GAS, ARTERIAL
Acid-Base Excess: 3.3 mmol/L — ABNORMAL HIGH (ref 0.0–2.0)
Bicarbonate: 29.1 mmol/L — ABNORMAL HIGH (ref 20.0–28.0)
FIO2: 28 %
MECHVT: 400 mL
O2 Saturation: 98.5 %
PEEP: 5 cmH2O
Patient temperature: 35.9
RATE: 16 resp/min
pCO2 arterial: 46 mmHg (ref 32–48)
pH, Arterial: 7.41 (ref 7.35–7.45)
pO2, Arterial: 81 mmHg — ABNORMAL LOW (ref 83–108)

## 2022-12-23 LAB — URINE DRUG SCREEN, QUALITATIVE (ARMC ONLY)
Amphetamines, Ur Screen: NOT DETECTED
Barbiturates, Ur Screen: NOT DETECTED
Benzodiazepine, Ur Scrn: POSITIVE — AB
Cannabinoid 50 Ng, Ur ~~LOC~~: NOT DETECTED
Cocaine Metabolite,Ur ~~LOC~~: NOT DETECTED
MDMA (Ecstasy)Ur Screen: NOT DETECTED
Methadone Scn, Ur: NOT DETECTED
Opiate, Ur Screen: NOT DETECTED
Phencyclidine (PCP) Ur S: NOT DETECTED
Tricyclic, Ur Screen: NOT DETECTED

## 2022-12-23 LAB — GLUCOSE, CAPILLARY
Glucose-Capillary: 157 mg/dL — ABNORMAL HIGH (ref 70–99)
Glucose-Capillary: 185 mg/dL — ABNORMAL HIGH (ref 70–99)
Glucose-Capillary: 203 mg/dL — ABNORMAL HIGH (ref 70–99)
Glucose-Capillary: 215 mg/dL — ABNORMAL HIGH (ref 70–99)
Glucose-Capillary: 221 mg/dL — ABNORMAL HIGH (ref 70–99)
Glucose-Capillary: 229 mg/dL — ABNORMAL HIGH (ref 70–99)
Glucose-Capillary: 241 mg/dL — ABNORMAL HIGH (ref 70–99)
Glucose-Capillary: 247 mg/dL — ABNORMAL HIGH (ref 70–99)

## 2022-12-23 LAB — MAGNESIUM
Magnesium: 2.4 mg/dL (ref 1.7–2.4)
Magnesium: 2.4 mg/dL (ref 1.7–2.4)

## 2022-12-23 LAB — BASIC METABOLIC PANEL WITH GFR
Anion gap: 9 (ref 5–15)
BUN: 11 mg/dL (ref 8–23)
CO2: 24 mmol/L (ref 22–32)
Calcium: 7.5 mg/dL — ABNORMAL LOW (ref 8.9–10.3)
Chloride: 105 mmol/L (ref 98–111)
Creatinine, Ser: 0.96 mg/dL (ref 0.61–1.24)
GFR, Estimated: >60 mL/min
Glucose, Bld: 178 mg/dL — ABNORMAL HIGH (ref 70–99)
Potassium: 4.1 mmol/L (ref 3.5–5.1)
Sodium: 138 mmol/L (ref 135–145)

## 2022-12-23 LAB — PHOSPHORUS
Phosphorus: 3.6 mg/dL (ref 2.5–4.6)
Phosphorus: 4.1 mg/dL (ref 2.5–4.6)

## 2022-12-23 LAB — CBC
HCT: 31 % — ABNORMAL LOW (ref 39.0–52.0)
Hemoglobin: 10.3 g/dL — ABNORMAL LOW (ref 13.0–17.0)
MCH: 32.7 pg (ref 26.0–34.0)
MCHC: 33.2 g/dL (ref 30.0–36.0)
MCV: 98.4 fL (ref 80.0–100.0)
Platelets: 100 K/uL — ABNORMAL LOW (ref 150–400)
RBC: 3.15 MIL/uL — ABNORMAL LOW (ref 4.22–5.81)
RDW: 15.6 % — ABNORMAL HIGH (ref 11.5–15.5)
WBC: 5.3 K/uL (ref 4.0–10.5)
nRBC: 0.6 % — ABNORMAL HIGH (ref 0.0–0.2)

## 2022-12-23 LAB — TRIGLYCERIDES: Triglycerides: 193 mg/dL — ABNORMAL HIGH (ref <150)

## 2022-12-23 LAB — TROPONIN I (HIGH SENSITIVITY): Troponin I (High Sensitivity): 66 ng/L — ABNORMAL HIGH (ref <18)

## 2022-12-23 LAB — POTASSIUM: Potassium: 2.9 mmol/L — ABNORMAL LOW (ref 3.5–5.1)

## 2022-12-23 MED ORDER — POTASSIUM CHLORIDE 10 MEQ/100ML IV SOLN
10.0000 meq | INTRAVENOUS | Status: AC
  Administered 2022-12-23 (×4): 10 meq via INTRAVENOUS
  Filled 2022-12-23 (×4): qty 100

## 2022-12-23 MED ORDER — DEXAMETHASONE SODIUM PHOSPHATE 4 MG/ML IJ SOLN
4.0000 mg | Freq: Three times a day (TID) | INTRAMUSCULAR | Status: AC
  Administered 2022-12-23 (×2): 4 mg via INTRAVENOUS
  Filled 2022-12-23 (×2): qty 1

## 2022-12-23 MED ORDER — FREE WATER
30.0000 mL | Status: DC
  Administered 2022-12-23 – 2023-01-08 (×80): 30 mL

## 2022-12-23 MED ORDER — SODIUM CHLORIDE 0.9 % IV SOLN
260.0000 mg | Freq: Once | INTRAVENOUS | Status: AC
  Administered 2022-12-23: 260 mg via INTRAVENOUS
  Filled 2022-12-23: qty 2

## 2022-12-23 MED ORDER — PHENOBARBITAL 32.4 MG PO TABS: 32.4000 mg | ORAL_TABLET | Freq: Three times a day (TID) | ORAL | Status: DC

## 2022-12-23 MED ORDER — DEXAMETHASONE SODIUM PHOSPHATE 4 MG/ML IJ SOLN
4.0000 mg | Freq: Once | INTRAMUSCULAR | Status: AC
  Administered 2022-12-25: 4 mg via INTRAVENOUS
  Filled 2022-12-23: qty 1

## 2022-12-23 MED ORDER — PROSOURCE TF20 ENFIT COMPATIBL EN LIQD
60.0000 mL | Freq: Three times a day (TID) | ENTERAL | Status: DC
  Administered 2022-12-23 – 2022-12-27 (×12): 60 mL

## 2022-12-23 MED ORDER — DEXAMETHASONE SODIUM PHOSPHATE 4 MG/ML IJ SOLN
4.0000 mg | Freq: Two times a day (BID) | INTRAMUSCULAR | Status: AC
  Administered 2022-12-24 (×2): 4 mg via INTRAVENOUS
  Filled 2022-12-23 (×2): qty 1

## 2022-12-23 MED ORDER — VITAL HIGH PROTEIN PO LIQD
1000.0000 mL | ORAL | Status: DC
  Administered 2022-12-23 – 2022-12-24 (×2): 1000 mL

## 2022-12-23 MED ORDER — PHENOBARBITAL 32.4 MG PO TABS
97.2000 mg | ORAL_TABLET | Freq: Three times a day (TID) | ORAL | Status: DC
  Administered 2022-12-23: 97.2 mg
  Filled 2022-12-23 (×2): qty 3

## 2022-12-23 MED ORDER — PHENOBARBITAL 32.4 MG PO TABS: 64.8000 mg | ORAL_TABLET | Freq: Three times a day (TID) | ORAL | Status: DC

## 2022-12-23 MED ORDER — POTASSIUM CHLORIDE 20 MEQ PO PACK
40.0000 meq | PACK | Freq: Once | ORAL | Status: AC
  Administered 2022-12-23: 40 meq
  Filled 2022-12-23: qty 2

## 2022-12-23 NOTE — Progress Notes (Signed)
PHARMACY CONSULT NOTE  Pharmacy Consult for Electrolyte Monitoring and Replacement   Recent Labs: Potassium (mmol/L)  Date Value  12/23/2022 4.1   Magnesium (mg/dL)  Date Value  12/23/2022 2.4   Calcium (mg/dL)  Date Value  12/23/2022 7.5 (L)   Albumin (g/dL)  Date Value  12/15/2022 3.4 (L)   Phosphorus (mg/dL)  Date Value  12/23/2022 3.6   Sodium (mmol/L)  Date Value  12/23/2022 138   Corrected Ca: 8 mg/dL  Assessment: 81 y.o. male with medical history significant of alcohol abuse, hypertension, hyperlipidemia, diabetes mellitus, COPD, dementia, depression with anxiety, pseudoseizure, former smoker, who presents with seizure. Pharmacy is asked to follow and replace electrolytes while in CCU   Goal of Therapy:  Electrolytes WNL  Plan:  ---no electrolyte replacement warranted today ---recheck electrolytes in am  Dallie Piles ,PharmD Clinical Pharmacist 12/23/2022 7:29 AM

## 2022-12-23 NOTE — Procedures (Signed)
Patient Name: Brett Blake  MRN: [id number]  Epilepsy Attending: Lora Havens  Referring Physician/Provider: Larena Glassman  Date: 12/23/2022 Duration: 35.35 mins  Patient history: 81yo M with ams. EEG to evaluate for seizure.  Level of alertness:  comatose  AEDs during EEG study: Propofol, LEV, Phenobarb  Technical aspects: This EEG study was done with scalp electrodes positioned according to the 10-20 International system of electrode placement. Electrical activity was reviewed with band pass filter of 1-'70Hz'$ , sensitivity of 7 uV/mm, display speed of 89m/sec with a '60Hz'$  notched filter applied as appropriate. EEG data were recorded continuously and digitally stored.  Video monitoring was available and reviewed as appropriate.  Description: EEG showed burst attenuation pattern with burst if 5-7h theta slowing lasting 2-4 seconds alternating with low amplitude 12-'15hz'$  generalized beta activity. Hyperventilation and photic stimulation were not performed.     ABNORMALITY - Burst attenuation, generalized  IMPRESSION: This study is suggestive of severe to profound diffuse encephalopathy, nonspecific etiology but likely related to sedation. No seizures or epileptiform discharges were seen throughout the recording.  Tyliek Timberman [name]

## 2022-12-23 NOTE — Progress Notes (Signed)
Initial Nutrition Assessment  DOCUMENTATION CODES:   Obesity unspecified  INTERVENTION:   -TF via OGT:   Vital HP @ 30 ml/hr  60 ml Prosource TF TID.    30 ml free water flush every 4 hours to maintain tube patency  Tube feeding regimen provides 900 kcal (100% of needs), 123 grams of protein, and 602 ml of H2O. Total free water: 782 ml daily  -Sent message via secure chat to DM coordinator due to persistent hyperglycemia and history of steroid use secondary to COPD; per chart review, pt is not on home meds for DM  TF + propofol for provide 1394 kcals  NUTRITION DIAGNOSIS:   Inadequate oral intake related to inability to eat as evidenced by NPO status.  GOAL:   Provide needs based on ASPEN/SCCM guidelines  MONITOR:   Vent status, TF tolerance  REASON FOR ASSESSMENT:   Ventilator    ASSESSMENT:   Pt with hx of ETOH Abuse who presented following a possible seizure activity the morning of 12/28.  Pt admitted with ETOH withdrawal requiring mechanical intubation.   Patient is currently intubated on ventilator support. OGT placement verified by x-ray.  MV: 6.6 L/min Temp (24hrs), Avg:99.1 F (37.3 C), Min:96.4 F (35.8 C), Max:100.9 F (38.3 C)  Propofol: 18.7 ml/hr (provides 494 kcals)  Reviewed I/O's: +1.5 L x 24 hours  UOP: 800 ml x 24 hours  Case discussed with MD, RN, and during ICU rounds. Pt with seizures secondary to DT's. Per RN, pt is "shaking worse than an earthquake" and this is exacerbated when touching pt. Seizures are responsive to ativan, but not breaking through propofol. Plan to reduce levo and keep propofol. Will continue decadron for airway swelling.   MD concerned for electrolyte imbalances and refeeding risk. Received permission to start TF today.   Reviewed wt hx; no wt loss noted over the past 10 months.   Medications reviewed and include decadron, colace, folic acid, miralax, vitamin B-1, levophed, levophed, and propofol.   Lab  Results  Component Value Date   HGBA1C 7.4 (H) 11/20/2022   PTA DM medications are none. Per chart review, pt not on medications at home, but history of steroids use secondary to COPD. Reached out to DM coordinator for recommendations as pt is starting TF.   Labs reviewed: CBGS: 203-221 (inpatient orders for glycemic control are none).    NUTRITION - FOCUSED PHYSICAL EXAM:  Flowsheet Row Most Recent Value  Orbital Region No depletion  Upper Arm Region No depletion  Thoracic and Lumbar Region No depletion  Buccal Region No depletion  Temple Region No depletion  Clavicle Bone Region No depletion  Clavicle and Acromion Bone Region No depletion  Scapular Bone Region No depletion  Dorsal Hand No depletion  Patellar Region No depletion  Anterior Thigh Region No depletion  Posterior Calf Region No depletion  Edema (RD Assessment) None  Hair Reviewed  Eyes Reviewed  Mouth Reviewed  Skin Reviewed  Nails Reviewed       Diet Order:   Diet Order     None       EDUCATION NEEDS:   Not appropriate for education at this time  Skin:  Skin Assessment: Reviewed RN Assessment  Last BM:  Unknown  Height:   Ht Readings from Last 1 Encounters:  12/08/2022 '5\' 4"'$  (1.626 m)    Weight:   Wt Readings from Last 1 Encounters:  12/25/2022 80 kg    Ideal Body Weight:  59.1 kg  BMI:  Body mass index is 30.27 kg/m.  Estimated Nutritional Needs:   Kcal:  [phone]  Protein:  > 118 grams  Fluid:  > 1.1 L    Loistine Chance, RD, LDN, [place] Registered Dietitian II Certified Diabetes Care and Education Specialist Please refer to [facility] for RD and/or RD on-call/weekend/after hours pager

## 2022-12-23 NOTE — Plan of Care (Signed)
Continuing with plan of care. 

## 2022-12-23 NOTE — Progress Notes (Signed)
NAME:  Brett Blake, MRN:  [id number], DOB:  Sep 19, 1941, LOS: 1 ADMISSION DATE:  11/26/2022, CONSULTATION DATE: 11/30/2022 REFERRING MD: Dr. Blaine Hamper, CHIEF COMPLAINT: AMS   History of Present Illness:  This is an 81 yo male with hx of ETOH Abuse who presented to [facility] ER on 12/28 via EMS from home following a possible seizure activity the morning of 12/28.  He has a hx of non epileptic seizures, however he does not take antiepileptics.  He reported he has not had a seizure in a long time.  All hx obtained from pts chart and daughter.   ED Course Upon arrival to the ER pt noted to have severe tremors.  Pt reported he drinks 1 small alcoholic beverage every night prior to going to bed.  However, pts wife reported he drank hard liquor heavily over the past several days and abruptly stopped drinking 2 days ago.  He also complained of an upset stomach.  ER lab results revealed K+ 3.1/glucose 138/calcium 8.3/albumin 3.4/AST 81/ALT 62/total protein 6.4/magnesium 1.6/wbc 3.9/hgb 12.1/platelets 125.  COVID-19/Influenza A&B negative.  Pt had a CIWA score of 4, he received 1 mg of iv ativan.  Hospitalist initially contacted for hospital admission.  However, pt became increasingly agitated and combative despite receiving a total of 3 mg of iv ativan and 3 mg of IM haldol.  Security called to bedside.  PCCM team consulted due to pt requiring precedex gtt.  However, despite precedex gtt at maximal dose; additional 2 mg of iv ativan, 12.5 mg of iv fentanyl, and 2.5 mg of iv haldol pt remained combative/agitated requiring mechanical intubation.  Pt admitted to ICU per PCCM team for additional workup and treatment.    CT Head 12/28:  No evidence of acute intracranial abnormality. Mild chronic small vessel ischemic disease. CXR 12/28: No active cardiopulmonary disease.   12/23/22- patient still having seizures despite propofol, the seizures do respond to Ativan.  Pharmacy is monitoring electrolytes.  He is weaning off  levophed.  He will start feeds today.  ABG with res alkalosis and RR was reduced.   Pertinent  Medical History  Anxiety  Arthritis  Asthma  Colon Polyps  COPD Depression  Type II Diabetes Mellitus Difficult Intubation  GERD  Hypercholesteremia  HLD HTN  Hyponatremia  OSA   Significant Hospital Events: Including procedures, antibiotic start and stop dates in addition to other pertinent events   12/28: Pt admitted with possible seizure activity suspect secondary to severe ETOH withdrawal requiring mechanical intubation   Interim History / Subjective:  Pt mechanically intubated RASS -4 following administration of intubation medications.  Pt initially hypertensive post intubation, however later became hypotensive secondary to sedating medications levophed gtt ordered     Objective   Blood pressure 109/70, pulse (!) 109, temperature (!) 96.8 F (36 C), temperature source Bladder, resp. rate 16, height _0  (1.626 m), weight 80 kg, SpO2 100 %.    Vent Mode: PRVC FiO2 (%):  [28 %-30 %] 28 % Set Rate:  [16 bmp] 16 bmp Vt Set:  [400 mL-480 mL] 400 mL PEEP:  [5 cmH20] 5 cmH20   Intake/Output Summary (Last 24 hours) at 12/23/2022 0840 Last data filed at 12/23/2022 0700 Gross per 24 hour  Intake 2329.64 ml  Output 800 ml  Net 1529.64 ml   Filed Weights   12/25/2022 1111 11/28/2022 2030  Weight: 78 kg 80 kg    Examination: General: Acutely-ill appearing male, NAD mechanically intubated HENT: Supple, difficult to assess for  JVD due to body habitus  Lungs: Diminished with expiratory wheezes, even, non labored  Cardiovascular: Sinus tachycardia, s1s2, no r/g, 2+ radial/1+ distal, no edema  Abdomen: +BS x4, obese, soft, non distended  Extremities: Normal bulk and tone, moves all extremities  Neuro: Sedated, not following commands, PERRL  GU: Indwelling foley catheter draining blood tinged urine   Resolved Hospital Problem list     Assessment & Plan:  Mechanical intubation for  airway protection in the setting of ETOH withdrawal  Hx: OSA, COPD, Asthma, and Difficult Intubation  - Full vent support for now: vent settings reviewed and established - Continue lung protective strategies - SBT once all parameters met  - IV steroids for airway edema in the setting of difficult intubation  - VAP bundle implemented  - Scheduled and prn bronchodilator therapy  - Nebulized steroids   HTN  Hx: Hypercholesteremia  - Continuous telemetry monitoring  - Check troponin's - Continue outpatient rosuvastatin   Hypokalemia  Hyponatremia  - Trend BMP  - Replace electrolytes as indicated  - Monitor UOP - Avoid nephrotoxic medications   Type II diabetes mellitus  - CBG's q4hrs  - SSI   Possible seizure activity in the setting of ETOH withdrawal  - Loading dose of keppra followed by maintenance keppra  - Seizure precautions  - Maintain RASS goal -2 to -3 - Propofol and fentanyl gtt to maintain RASS goal  - WUA daily when appropriate  - EEG pending   Best Practice (right click and "Reselect all SmartList Selections" daily)   Diet/type: NPO DVT prophylaxis: LMWH GI prophylaxis: H2B Lines: N/A Foley:  Yes, and it is still needed Code Status:  full code Last date of multidisciplinary goals of care discussion [N/A]  Labs   CBC: Recent Labs  Lab 12/20/2022 1150 12/23/22 0603  WBC 3.9* 5.3  HGB 12.1* 10.3*  HCT 35.5* 31.0*  MCV 96.2 98.4  PLT 125* 100*     Basic Metabolic Panel: Recent Labs  Lab 12/03/2022 1149 11/27/2022 1150 12/23/22 0017 12/23/22 0603  NA  --  139  --  138  K  --  3.1* 2.9* 4.1  CL  --  101  --  105  CO2  --  24  --  24  GLUCOSE  --  138*  --  178*  BUN  --  15  --  11  CREATININE  --  1.13  --  0.96  CALCIUM  --  8.3*  --  7.5*  MG 1.6*  --  2.4 2.4  PHOS  --  3.1 4.1 3.6    GFR: Estimated Creatinine Clearance: 58.6 mL/min (by C-G formula based on SCr of 0.96 mg/dL). Recent Labs  Lab 12/18/2022 1150 12/23/22 0603  WBC 3.9*  5.3     Liver Function Tests: Recent Labs  Lab 12/16/2022 1150  AST 81*  ALT 62*  ALKPHOS 85  BILITOT 1.2  PROT 6.4*  ALBUMIN 3.4*    No results for input(s): "LIPASE", "AMYLASE" in the last 168 hours. No results for input(s): "AMMONIA" in the last 168 hours.  ABG    Component Value Date/Time   PHART 7.41 12/23/2022 0423   PCO2ART 46 12/23/2022 0423   PO2ART 81 (L) 12/23/2022 0423   HCO3 29.1 (H) 12/23/2022 0423   O2SAT 98.5 12/23/2022 0423     Coagulation Profile: No results for input(s): "INR", "PROTIME" in the last 168 hours.  Cardiac Enzymes: No results for input(s): "CKTOTAL", "CKMB", "CKMBINDEX", "TROPONINI" in the  last 168 hours.  HbA1C: Hgb A1c MFr Bld  Date/Time Value Ref Range Status  11/20/2022 05:04 AM 7.4 (H) 4.8 - 5.6 % Final    Comment:    (NOTE)         Prediabetes: 5.7 - 6.4         Diabetes: >6.4         Glycemic control for adults with diabetes: <7.0   02/15/2022 05:57 AM 5.9 (H) 4.8 - 5.6 % Final    Comment:    (NOTE)         Prediabetes: 5.7 - 6.4         Diabetes: >6.4         Glycemic control for adults with diabetes: <7.0     CBG: Recent Labs  Lab 11/30/2022 2034 12/23/22 0014 12/23/22 0349 12/23/22 0807  GLUCAP 157* 229* 185* 203*    Review of Systems:   Unable to assess pt mechanically intubated   Past Medical History:  He,  has a past medical history of Anxiety, Arthritis, Asthma, Colon polyps, COPD (chronic obstructive pulmonary disease) ([place]), Depression, Diabetes ([place]), Difficult intubation, GERD (gastroesophageal reflux disease), High cholesterol, Hyperlipidemia, Hypertension, Hyponatremia (02/23/2022), and Sleep apnea.   Surgical History:   Past Surgical History:  Procedure Laterality Date   BRONCHOSCOPY     CARDIAC CATHETERIZATION Left 11/04/2016   Procedure: Left Heart Cath and Coronary Angiography;  Surgeon: Teodoro Spray, MD;  Location: [facility];  Service: Cardiovascular;  Laterality: Left;    CARDIAC CATHETERIZATION     CHOLECYSTECTOMY N/A 05/09/2019   Procedure: LAPAROSCOPIC CHOLECYSTECTOMY WITH INTRAOPERATIVE CHOLANGIOGRAM;  Surgeon: Jules Husbands, MD;  Location: [facility];  Service: General;  Laterality: N/A;   COLOSTOMY     IR KYPHO LUMBAR INC FX REDUCE BONE BX UNI/BIL CANNULATION INC/IMAGING  11/10/2022   IR RADIOLOGIST EVAL & MGMT  11/08/2022   LEFT HEART CATH AND CORONARY ANGIOGRAPHY N/A 12/14/2021   Procedure: LEFT HEART CATH AND CORONARY ANGIOGRAPHY;  Surgeon: Corey Skains, MD;  Location: [facility];  Service: Cardiovascular;  Laterality: N/A;   ROTATOR CUFF REPAIR Left    THUMB ARTHROSCOPY  2015   TONSILLECTOMY  1952   TONSILLECTOMY       Social History:   reports that he has quit smoking. His smoking use included cigarettes. He has a 15.00 pack-year smoking history. He has never used smokeless tobacco. He reports current alcohol use of about 1.0 - 2.0 standard drink of alcohol per week. He reports that he does not use drugs.   Family History:  His family history includes Heart attack in his mother; Leukemia in his brother; Prostate cancer in his brother and father.   Allergies Allergies  Allergen Reactions   Ace Inhibitors Cough   Metoprolol Diarrhea    Patient has been tolerating Propranolol 40 mg twice daily since at least 05/2022.   Nsaids Rash   Sulfa Antibiotics Rash     Home Medications  Prior to Admission medications   Medication Sig Start Date End Date Taking? Authorizing Provider  albuterol (ACCUNEB) 1.25 MG/3ML nebulizer solution Take 3 mLs by nebulization every 6 (six) hours as needed for wheezing. 11/20/22  Yes Wieting, Richard, MD  ALPRAZolam Duanne Moron) 0.5 MG tablet Take 0.5 mg by mouth daily as needed for anxiety. 10/06/22 10/06/23 Yes [provider]  aspirin EC 81 MG tablet Take 81 mg by mouth daily. Swallow whole.   Yes [provider]  diltiazem (CARDIZEM CD)  240 MG 24 hr capsule Take 240 mg by mouth every  morning. 12/07/21  Yes [provider]  donepezil (ARICEPT) 5 MG tablet Take 5 mg by mouth at bedtime. 05/10/22  Yes [provider]  fluticasone (FLONASE) 50 MCG/ACT nasal spray Place 1 spray into both nostrils daily as needed for allergies or rhinitis.   Yes [provider]  folic acid (FOLVITE) 1 MG tablet Take 1 tablet (1 mg total) by mouth daily. 03/01/22  Yes Wieting, Richard, MD  gabapentin (NEURONTIN) 300 MG capsule Take 1 capsule (300 mg total) by mouth at bedtime. 02/28/22  Yes Wieting, Richard, MD  melatonin 5 MG TABS Take 1 tablet (5 mg total) by mouth at bedtime. 02/28/22  Yes Wieting, Richard, MD  nitroGLYCERIN (NITROSTAT) 0.4 MG SL tablet Place 0.4 mg under the tongue every 5 (five) minutes as needed for chest pain.   Yes [provider]  omeprazole (PRILOSEC) 40 MG capsule Take 40 mg by mouth daily. 04/20/22  Yes [provider]  ondansetron (ZOFRAN-ODT) 4 MG disintegrating tablet Take 1 tablet (4 mg total) by mouth every 8 (eight) hours as needed for nausea or vomiting. 06/11/22  Yes Carrie Mew, MD  propranolol (INDERAL) 40 MG tablet Take 40 mg by mouth 2 (two) times daily. 11/07/22  Yes [provider]  QUEtiapine (SEROQUEL) 25 MG tablet One tab po qhs (can take extra dose if not sleeping) 02/28/22  Yes Wieting, Richard, MD  rosuvastatin (CRESTOR) 5 MG tablet Take 5 mg by mouth daily. 05/10/22  Yes [provider]  SYMBICORT 80-4.5 MCG/ACT inhaler Inhale 2 puffs into the lungs 2 (two) times daily as needed.  05/19/20  Yes [provider]  venlafaxine XR (EFFEXOR-XR) 37.5 MG 24 hr capsule Take 37.5 mg by mouth daily. 12/07/21  Yes [provider]  azithromycin (ZITHROMAX) 250 MG tablet One tab po daily for three days Patient not taking: Reported on 12/19/2022 11/21/22   Loletha Grayer, MD  guaiFENesin (ROBITUSSIN) 100 MG/5ML liquid Take 5 mLs by mouth every 4 (four) hours as needed for cough or to loosen  phlegm. Patient not taking: Reported on 11/28/2022 02/28/22   Loletha Grayer, MD  polyethylene glycol (MIRALAX / GLYCOLAX) 17 g packet Take 17 g by mouth daily as needed for moderate constipation. Patient not taking: Reported on 11/08/2022 02/28/22   Loletha Grayer, MD    Critical care provider statement:   Total critical care time: 33 minutes   Performed by: Lanney Gins MD   Critical care time was exclusive of separately billable procedures and treating other patients.   Critical care was necessary to treat or prevent imminent or life-threatening deterioration.   Critical care was time spent personally by me on the following activities: development of treatment plan with patient and/or surrogate as well as nursing, discussions with consultants, evaluation of patient's response to treatment, examination of patient, obtaining history from patient or surrogate, ordering and performing treatments and interventions, ordering and review of laboratory studies, ordering and review of radiographic studies, pulse oximetry and re-evaluation of patient's condition.    Ottie Glazier, M.D.  Pulmonary & Critical Care Medicine

## 2022-12-23 NOTE — Progress Notes (Signed)
Eeg done 

## 2022-12-23 NOTE — Progress Notes (Signed)
An USGPIV (ultrasound guided PIV) has been placed for short-term vasopressor infusion. A correctly placed ivWatch must be used when administering Vasopressors. Should this treatment be needed beyond 72 hours, central line access should be obtained.  It will be the responsibility of the bedside nurse to follow best practice to prevent extravasations.   ?

## 2022-12-24 ENCOUNTER — Inpatient Hospital Stay: Payer: Medicare Other

## 2022-12-24 LAB — RENAL FUNCTION PANEL
Albumin: 2.7 g/dL — ABNORMAL LOW (ref 3.5–5.0)
Anion gap: 11 (ref 5–15)
BUN: 18 mg/dL (ref 8–23)
CO2: 23 mmol/L (ref 22–32)
Calcium: 8.2 mg/dL — ABNORMAL LOW (ref 8.9–10.3)
Chloride: 104 mmol/L (ref 98–111)
Creatinine, Ser: 1.95 mg/dL — ABNORMAL HIGH (ref 0.61–1.24)
GFR, Estimated: 34 mL/min — ABNORMAL LOW (ref >60)
Glucose, Bld: 181 mg/dL — ABNORMAL HIGH (ref 70–99)
Phosphorus: 4.9 mg/dL — ABNORMAL HIGH (ref 2.5–4.6)
Potassium: 4.4 mmol/L (ref 3.5–5.1)
Sodium: 138 mmol/L (ref 135–145)

## 2022-12-24 LAB — CBC
HCT: 32.1 % — ABNORMAL LOW (ref 39.0–52.0)
Hemoglobin: 10.3 g/dL — ABNORMAL LOW (ref 13.0–17.0)
MCH: 32.7 pg (ref 26.0–34.0)
MCHC: 32.1 g/dL (ref 30.0–36.0)
MCV: 101.9 fL — ABNORMAL HIGH (ref 80.0–100.0)
Platelets: 133 10*3/uL — ABNORMAL LOW (ref 150–400)
RBC: 3.15 MIL/uL — ABNORMAL LOW (ref 4.22–5.81)
RDW: 15.9 % — ABNORMAL HIGH (ref 11.5–15.5)
WBC: 8.2 10*3/uL (ref 4.0–10.5)
nRBC: 0.6 % — ABNORMAL HIGH (ref 0.0–0.2)

## 2022-12-24 LAB — GLUCOSE, CAPILLARY
Glucose-Capillary: 124 mg/dL — ABNORMAL HIGH (ref 70–99)
Glucose-Capillary: 162 mg/dL — ABNORMAL HIGH (ref 70–99)
Glucose-Capillary: 166 mg/dL — ABNORMAL HIGH (ref 70–99)
Glucose-Capillary: 184 mg/dL — ABNORMAL HIGH (ref 70–99)
Glucose-Capillary: 188 mg/dL — ABNORMAL HIGH (ref 70–99)
Glucose-Capillary: 212 mg/dL — ABNORMAL HIGH (ref 70–99)

## 2022-12-24 LAB — MAGNESIUM: Magnesium: 2.4 mg/dL (ref 1.7–2.4)

## 2022-12-24 MED ORDER — ENOXAPARIN SODIUM 30 MG/0.3ML IJ SOSY
30.0000 mg | PREFILLED_SYRINGE | INTRAMUSCULAR | Status: DC
  Administered 2022-12-24: 30 mg via SUBCUTANEOUS
  Filled 2022-12-24: qty 0.3

## 2022-12-24 MED ORDER — SODIUM CHLORIDE 0.9 % IV BOLUS
1000.0000 mL | Freq: Once | INTRAVENOUS | Status: AC
  Administered 2022-12-24: 1000 mL via INTRAVENOUS

## 2022-12-24 NOTE — Progress Notes (Signed)
NAME:  Brett Blake, MRN:  [id number], DOB:  [birth year], LOS: 2 ADMISSION DATE:  12/02/2022, CONSULTATION DATE: 12/13/2022 REFERRING MD: Dr. Blaine Hamper, CHIEF COMPLAINT: AMS   History of Present Illness:  This is an 81 yo male with hx of ETOH Abuse who presented to [facility] ER on 12/28 via EMS from home following a possible seizure activity the morning of 12/28.  He has a hx of non epileptic seizures, however he does not take antiepileptics.  He reported he has not had a seizure in a long time.  All hx obtained from pts chart and daughter.   ED Course Upon arrival to the ER pt noted to have severe tremors.  Pt reported he drinks 1 small alcoholic beverage every night prior to going to bed.  However, pts wife reported he drank hard liquor heavily over the past several days and abruptly stopped drinking 2 days ago.  He also complained of an upset stomach.  ER lab results revealed K+ 3.1/glucose 138/calcium 8.3/albumin 3.4/AST 81/ALT 62/total protein 6.4/magnesium 1.6/wbc 3.9/hgb 12.1/platelets 125.  COVID-19/Influenza A&B negative.  Pt had a CIWA score of 4, he received 1 mg of iv ativan.  Hospitalist initially contacted for hospital admission.  However, pt became increasingly agitated and combative despite receiving a total of 3 mg of iv ativan and 3 mg of IM haldol.  Security called to bedside.  PCCM team consulted due to pt requiring precedex gtt.  However, despite precedex gtt at maximal dose; additional 2 mg of iv ativan, 12.5 mg of iv fentanyl, and 2.5 mg of iv haldol pt remained combative/agitated requiring mechanical intubation.  Pt admitted to ICU per PCCM team for additional workup and treatment.    CT Head 12/28:  No evidence of acute intracranial abnormality. Mild chronic small vessel ischemic disease. CXR 12/28: No active cardiopulmonary disease.   12/23/22- patient still having seizures despite propofol, the seizures do respond to Ativan.  Pharmacy is monitoring electrolytes.  He is weaning off  levophed.  He will start feeds today.  ABG with res alkalosis and RR was reduced.  12/24/22- patient improved, dcd phenobarb and wake up assessment was very poor today with onset of tachyarrythmia in 140s and seizure like activity upon reduction of sedation.   Pertinent  Medical History  Anxiety  Arthritis  Asthma  Colon Polyps  COPD Depression  Type II Diabetes Mellitus Difficult Intubation  GERD  Hypercholesteremia  HLD HTN  Hyponatremia  OSA   Significant Hospital Events: Including procedures, antibiotic start and stop dates in addition to other pertinent events   12/28: Pt admitted with possible seizure activity suspect secondary to severe ETOH withdrawal requiring mechanical intubation   Interim History / Subjective:  Pt mechanically intubated RASS -4 following administration of intubation medications.  Pt initially hypertensive post intubation, however later became hypotensive secondary to sedating medications levophed gtt ordered     Objective   Blood pressure 107/64, pulse (!) 128, temperature 98.2 F (36.8 C), resp. rate 16, height _0  (1.626 m), weight 80 kg, SpO2 98 %.    Vent Mode: PRVC FiO2 (%):  [24 %-28 %] 24 % Set Rate:  [16 bmp] 16 bmp Vt Set:  [400 mL] 400 mL PEEP:  [5 cmH20] 5 cmH20 Plateau Pressure:  [17 cmH20] 17 cmH20   Intake/Output Summary (Last 24 hours) at 12/24/2022 0832 Last data filed at 12/24/2022 0606 Gross per 24 hour  Intake 1638.03 ml  Output 680 ml  Net 958.03 ml  Filed Weights   12/23/2022 1111 12/21/2022 2030  Weight: 78 kg 80 kg    Examination: General: Acutely-ill appearing male, NAD mechanically intubated HENT: Supple, difficult to assess for JVD due to body habitus  Lungs: Diminished with expiratory wheezes, even, non labored  Cardiovascular: Sinus tachycardia, s1s2, no r/g, 2+ radial/1+ distal, no edema  Abdomen: +BS x4, obese, soft, non distended  Extremities: Normal bulk and tone, moves all extremities  Neuro:  Sedated, not following commands, PERRL  GU: Indwelling foley catheter draining blood tinged urine   Resolved Hospital Problem list     Assessment & Plan:  Mechanical intubation for airway protection in the setting of ETOH withdrawal  Hx: OSA, COPD, Asthma, and Difficult Intubation  - Full vent support for now: vent settings reviewed and established - Continue lung protective strategies - SBT once all parameters met  - IV steroids for airway edema in the setting of difficult intubation  - VAP bundle implemented  - Scheduled and prn bronchodilator therapy  - Nebulized steroids   HTN  Hx: Hypercholesteremia  - Continuous telemetry monitoring  - Check troponin's - Continue outpatient rosuvastatin   Hypokalemia  Hyponatremia  - Trend BMP  - Replace electrolytes as indicated  - Monitor UOP - Avoid nephrotoxic medications   Type II diabetes mellitus  - CBG's q4hrs  - SSI   DELERIUM TREMENS - Loading dose of keppra followed by maintenance keppra  - Seizure precautions -VERY HIGH SEDATION REQUIRMENTS - Maintain RASS goal -2 to -3 - Propofol and fentanyl gtt to maintain RASS goal  - WUA daily when appropriate  - EEG pending   Best Practice (right click and "Reselect all SmartList Selections" daily)   Diet/type: NPO DVT prophylaxis: LMWH GI prophylaxis: H2B Lines: N/A Foley:  Yes, and it is still needed Code Status:  full code Last date of multidisciplinary goals of care discussion [N/A]  Labs   CBC: Recent Labs  Lab 12/23/2022 1150 12/23/22 0603 12/24/22 0406  WBC 3.9* 5.3 8.2  HGB 12.1* 10.3* 10.3*  HCT 35.5* 31.0* 32.1*  MCV 96.2 98.4 101.9*  PLT 125* 100* 133*     Basic Metabolic Panel: Recent Labs  Lab 12/18/2022 1149 12/03/2022 1150 12/23/22 0017 12/23/22 0603 12/24/22 0406  NA  --  139  --  138 138  K  --  3.1* 2.9* 4.1 4.4  CL  --  101  --  105 104  CO2  --  24  --  24 23  GLUCOSE  --  138*  --  178* 181*  BUN  --  15  --  11 18  CREATININE  --   1.13  --  0.96 1.95*  CALCIUM  --  8.3*  --  7.5* 8.2*  MG 1.6*  --  2.4 2.4 2.4  PHOS  --  3.1 4.1 3.6 4.9*    GFR: Estimated Creatinine Clearance: 28.8 mL/min (A) (by C-G formula based on SCr of 1.95 mg/dL (H)). Recent Labs  Lab 12/24/2022 1150 12/23/22 0603 12/24/22 0406  WBC 3.9* 5.3 8.2     Liver Function Tests: Recent Labs  Lab 11/29/2022 1150 12/24/22 0406  AST 81*  --   ALT 62*  --   ALKPHOS 85  --   BILITOT 1.2  --   PROT 6.4*  --   ALBUMIN 3.4* 2.7*    No results for input(s): "LIPASE", "AMYLASE" in the last 168 hours. No results for input(s): "AMMONIA" in the last 168 hours.  ABG  Component Value Date/Time   PHART 7.41 12/23/2022 0423   PCO2ART 46 12/23/2022 0423   PO2ART 81 (L) 12/23/2022 0423   HCO3 29.1 (H) 12/23/2022 0423   O2SAT 98.5 12/23/2022 0423     Coagulation Profile: No results for input(s): "INR", "PROTIME" in the last 168 hours.  Cardiac Enzymes: No results for input(s): "CKTOTAL", "CKMB", "CKMBINDEX", "TROPONINI" in the last 168 hours.  HbA1C: Hgb A1c MFr Bld  Date/Time Value Ref Range Status  11/20/2022 05:04 AM 7.4 (H) 4.8 - 5.6 % Final    Comment:    (NOTE)         Prediabetes: 5.7 - 6.4         Diabetes: >6.4         Glycemic control for adults with diabetes: <7.0   02/15/2022 05:57 AM 5.9 (H) 4.8 - 5.6 % Final    Comment:    (NOTE)         Prediabetes: 5.7 - 6.4         Diabetes: >6.4         Glycemic control for adults with diabetes: <7.0     CBG: Recent Labs  Lab 12/23/22 1611 12/23/22 1953 12/23/22 2325 12/24/22 0353 12/24/22 0741  GLUCAP 241* 247* 215* 188* 166*     Review of Systems:   Unable to assess pt mechanically intubated   Past Medical History:  He,  has a past medical history of Anxiety, Arthritis, Asthma, Colon polyps, COPD (chronic obstructive pulmonary disease) ([place]), Depression, Diabetes ([place]), Difficult intubation, GERD (gastroesophageal reflux disease), High cholesterol, Hyperlipidemia,  Hypertension, Hyponatremia (02/23/2022), and Sleep apnea.   Surgical History:   Past Surgical History:  Procedure Laterality Date   BRONCHOSCOPY     CARDIAC CATHETERIZATION Left 11/04/2016   Procedure: Left Heart Cath and Coronary Angiography;  Surgeon: Teodoro Spray, MD;  Location: [place] [facility];  Service: Cardiovascular;  Laterality: Left;   CARDIAC CATHETERIZATION     CHOLECYSTECTOMY N/A 05/09/2019   Procedure: LAPAROSCOPIC CHOLECYSTECTOMY WITH INTRAOPERATIVE CHOLANGIOGRAM;  Surgeon: Jules Husbands, MD;  Location: [facility];  Service: General;  Laterality: N/A;   COLOSTOMY     IR KYPHO LUMBAR INC FX REDUCE BONE BX UNI/BIL CANNULATION INC/IMAGING  11/10/2022   IR RADIOLOGIST EVAL & MGMT  11/08/2022   LEFT HEART CATH AND CORONARY ANGIOGRAPHY N/A 12/14/2021   Procedure: LEFT HEART CATH AND CORONARY ANGIOGRAPHY;  Surgeon: Corey Skains, MD;  Location: [place] [facility];  Service: Cardiovascular;  Laterality: N/A;   ROTATOR CUFF REPAIR Left    THUMB ARTHROSCOPY  2015   TONSILLECTOMY  1952   TONSILLECTOMY       Social History:   reports that he has quit smoking. His smoking use included cigarettes. He has a 15.00 pack-year smoking history. He has never used smokeless tobacco. He reports current alcohol use of about 1.0 - 2.0 standard drink of alcohol per week. He reports that he does not use drugs.   Family History:  His family history includes Heart attack in his mother; Leukemia in his brother; Prostate cancer in his brother and father.   Allergies Allergies  Allergen Reactions   Ace Inhibitors Cough   Metoprolol Diarrhea    Patient has been tolerating Propranolol 40 mg twice daily since at least 05/2022.   Nsaids Rash   Sulfa Antibiotics Rash     Home Medications  Prior to Admission medications   Medication Sig Start Date End Date Taking? Authorizing Provider  albuterol (ACCUNEB)  1.25 MG/3ML nebulizer solution Take 3 mLs by nebulization every 6 (six) hours as  needed for wheezing. 11/20/22  Yes Wieting, Richard, MD  ALPRAZolam Duanne Moron) 0.5 MG tablet Take 0.5 mg by mouth daily as needed for anxiety. 10/06/22 10/06/23 Yes [provider]  aspirin EC 81 MG tablet Take 81 mg by mouth daily. Swallow whole.   Yes [provider]  diltiazem (CARDIZEM CD) 240 MG 24 hr capsule Take 240 mg by mouth every morning. 12/07/21  Yes [provider]  donepezil (ARICEPT) 5 MG tablet Take 5 mg by mouth at bedtime. 05/10/22  Yes [provider]  fluticasone (FLONASE) 50 MCG/ACT nasal spray Place 1 spray into both nostrils daily as needed for allergies or rhinitis.   Yes [provider]  folic acid (FOLVITE) 1 MG tablet Take 1 tablet (1 mg total) by mouth daily. 03/01/22  Yes Wieting, Richard, MD  gabapentin (NEURONTIN) 300 MG capsule Take 1 capsule (300 mg total) by mouth at bedtime. 02/28/22  Yes Wieting, Richard, MD  melatonin 5 MG TABS Take 1 tablet (5 mg total) by mouth at bedtime. 02/28/22  Yes Wieting, Richard, MD  nitroGLYCERIN (NITROSTAT) 0.4 MG SL tablet Place 0.4 mg under the tongue every 5 (five) minutes as needed for chest pain.   Yes [provider]  omeprazole (PRILOSEC) 40 MG capsule Take 40 mg by mouth daily. 04/20/22  Yes [provider]  ondansetron (ZOFRAN-ODT) 4 MG disintegrating tablet Take 1 tablet (4 mg total) by mouth every 8 (eight) hours as needed for nausea or vomiting. 06/11/22  Yes Carrie Mew, MD  propranolol (INDERAL) 40 MG tablet Take 40 mg by mouth 2 (two) times daily. 11/07/22  Yes [provider]  QUEtiapine (SEROQUEL) 25 MG tablet One tab po qhs (can take extra dose if not sleeping) 02/28/22  Yes Wieting, Richard, MD  rosuvastatin (CRESTOR) 5 MG tablet Take 5 mg by mouth daily. 05/10/22  Yes [provider]  SYMBICORT 80-4.5 MCG/ACT inhaler Inhale 2 puffs into the lungs 2 (two) times daily as needed.  05/19/20  Yes [provider]  venlafaxine XR (EFFEXOR-XR)  37.5 MG 24 hr capsule Take 37.5 mg by mouth daily. 12/07/21  Yes [provider]  azithromycin (ZITHROMAX) 250 MG tablet One tab po daily for three days Patient not taking: Reported on 12/17/2022 11/21/22   Loletha Grayer, MD  guaiFENesin (ROBITUSSIN) 100 MG/5ML liquid Take 5 mLs by mouth every 4 (four) hours as needed for cough or to loosen phlegm. Patient not taking: Reported on 12/07/2022 02/28/22   Loletha Grayer, MD  polyethylene glycol (MIRALAX / GLYCOLAX) 17 g packet Take 17 g by mouth daily as needed for moderate constipation. Patient not taking: Reported on 11/08/2022 02/28/22   Loletha Grayer, MD    Critical care provider statement:   Total critical care time: 33 minutes   Performed by: Lanney Gins MD   Critical care time was exclusive of separately billable procedures and treating other patients.   Critical care was necessary to treat or prevent imminent or life-threatening deterioration.   Critical care was time spent personally by me on the following activities: development of treatment plan with patient and/or surrogate as well as nursing, discussions with consultants, evaluation of patient's response to treatment, examination of patient, obtaining history from patient or surrogate, ordering and performing treatments and interventions, ordering and review of laboratory studies, ordering and review of radiographic studies, pulse oximetry and re-evaluation of patient's condition.    Ottie Glazier, M.D.  Pulmonary & Critical Care Medicine

## 2022-12-24 NOTE — Progress Notes (Signed)
Early in the day shift NP and MD notified of patient's low output overnight, mid-shift patient continued with low output and provider notified, overall output for shift was 23m and NP notified and ordered 1L bolus.  Endorsed to oncoming shift.

## 2022-12-24 NOTE — Progress Notes (Signed)
PHARMACIST - PHYSICIAN COMMUNICATION  CONCERNING:  Enoxaparin (Lovenox) for DVT Prophylaxis    RECOMMENDATION: Patient was prescribed enoxaparin '40mg'$  q24 hours for VTE prophylaxis.   Filed Weights   12/05/2022 1111 12/06/2022 2030  Weight: 78 kg (171 lb 15.3 oz) 80 kg (176 lb 5.9 oz)    Body mass index is 30.27 kg/m.  Estimated Creatinine Clearance: 28.8 mL/min (A) (by C-G formula based on SCr of 1.95 mg/dL (H)).  Patient is candidate for enoxaparin '30mg'$  every 24 hours based on CrCl <28m/min or Weight <45kg  DESCRIPTION: [facility]  Patient is now receiving enoxaparin 30 mg every 24 hours   ABenita Gutter12/30/2023 7:22 AM

## 2022-12-24 NOTE — Plan of Care (Signed)
Continuing with plan of care. 

## 2022-12-24 NOTE — Progress Notes (Signed)
While turning patient to side noted oral secretions the color and consistency of tube feed, turned of feeding pump for now and notified Rufina Falco NP, no orders received, will continue to monitor.

## 2022-12-24 NOTE — Progress Notes (Signed)
Urine output much less than charted by day shift, bladder scan at 50m, flushed foley in case there was a clot, notified ERufina FalcoNP of current situation. No orders received, will continue to monitor.

## 2022-12-24 NOTE — Progress Notes (Signed)
PHARMACY CONSULT NOTE  Pharmacy Consult for Electrolyte Monitoring and Replacement   Recent Labs: Potassium (mmol/L)  Date Value  12/24/2022 4.4   Magnesium (mg/dL)  Date Value  12/24/2022 2.4   Calcium (mg/dL)  Date Value  12/24/2022 8.2 (L)   Albumin (g/dL)  Date Value  12/24/2022 2.7 (L)   Phosphorus (mg/dL)  Date Value  12/24/2022 4.9 (H)   Sodium (mmol/L)  Date Value  12/24/2022 138    Assessment: 81 y.o. male with medical history significant of alcohol abuse, hypertension, hyperlipidemia, diabetes mellitus, COPD, dementia, depression with anxiety, pseudoseizure, former smoker, who presents with seizure. Pharmacy is asked to follow and replace electrolytes while in CCU. Free water 30 ml q4H.   Goal of Therapy:  Electrolytes WNL  Plan:  ---no electrolyte replacement warranted today ---recheck electrolytes in am  Oswald Hillock ,PharmD Clinical Pharmacist 12/24/2022 8:17 AM

## 2022-12-25 ENCOUNTER — Inpatient Hospital Stay: Payer: Medicare Other

## 2022-12-25 LAB — GLUCOSE, CAPILLARY
Glucose-Capillary: 140 mg/dL — ABNORMAL HIGH (ref 70–99)
Glucose-Capillary: 132 mg/dL — ABNORMAL HIGH (ref 70–99)
Glucose-Capillary: 152 mg/dL — ABNORMAL HIGH (ref 70–99)
Glucose-Capillary: 156 mg/dL — ABNORMAL HIGH (ref 70–99)
Glucose-Capillary: 159 mg/dL — ABNORMAL HIGH (ref 70–99)
Glucose-Capillary: 144 mg/dL — ABNORMAL HIGH (ref 70–99)

## 2022-12-25 LAB — RENAL FUNCTION PANEL
Albumin: 2.4 g/dL — ABNORMAL LOW (ref 3.5–5.0)
Anion gap: 9 (ref 5–15)
BUN: 33 mg/dL — ABNORMAL HIGH (ref 8–23)
CO2: 23 mmol/L (ref 22–32)
Calcium: 8 mg/dL — ABNORMAL LOW (ref 8.9–10.3)
Chloride: 106 mmol/L (ref 98–111)
Creatinine, Ser: 3.09 mg/dL — ABNORMAL HIGH (ref 0.61–1.24)
GFR, Estimated: 20 mL/min — ABNORMAL LOW (ref >60)
Glucose, Bld: 139 mg/dL — ABNORMAL HIGH (ref 70–99)
Phosphorus: 5.7 mg/dL — ABNORMAL HIGH (ref 2.5–4.6)
Potassium: 4.8 mmol/L (ref 3.5–5.1)
Sodium: 138 mmol/L (ref 135–145)

## 2022-12-25 LAB — BASIC METABOLIC PANEL
Anion gap: 11 (ref 5–15)
BUN: 42 mg/dL — ABNORMAL HIGH (ref 8–23)
CO2: 20 mmol/L — ABNORMAL LOW (ref 22–32)
Calcium: 8.1 mg/dL — ABNORMAL LOW (ref 8.9–10.3)
Chloride: 104 mmol/L (ref 98–111)
Creatinine, Ser: 3.29 mg/dL — ABNORMAL HIGH (ref 0.61–1.24)
GFR, Estimated: 18 mL/min — ABNORMAL LOW (ref >60)
Glucose, Bld: 158 mg/dL — ABNORMAL HIGH (ref 70–99)
Potassium: 4.9 mmol/L (ref 3.5–5.1)
Sodium: 135 mmol/L (ref 135–145)

## 2022-12-25 LAB — CK: Total CK: 81 U/L (ref 49–397)

## 2022-12-25 LAB — CBC
HCT: 29 % — ABNORMAL LOW (ref 39.0–52.0)
Hemoglobin: 9.2 g/dL — ABNORMAL LOW (ref 13.0–17.0)
MCH: 32.9 pg (ref 26.0–34.0)
MCHC: 31.7 g/dL (ref 30.0–36.0)
MCV: 103.6 fL — ABNORMAL HIGH (ref 80.0–100.0)
Platelets: 129 10*3/uL — ABNORMAL LOW (ref 150–400)
RBC: 2.8 MIL/uL — ABNORMAL LOW (ref 4.22–5.81)
RDW: 16.3 % — ABNORMAL HIGH (ref 11.5–15.5)
WBC: 7.1 10*3/uL (ref 4.0–10.5)
nRBC: 2.1 % — ABNORMAL HIGH (ref 0.0–0.2)

## 2022-12-25 LAB — MAGNESIUM: Magnesium: 2.2 mg/dL (ref 1.7–2.4)

## 2022-12-25 MED ORDER — HEPARIN SODIUM (PORCINE) 5000 UNIT/ML IJ SOLN
5000.0000 [IU] | Freq: Three times a day (TID) | INTRAMUSCULAR | Status: DC
  Administered 2022-12-25 – 2023-01-08 (×40): 5000 [IU] via SUBCUTANEOUS
  Filled 2022-12-25 (×40): qty 1

## 2022-12-25 NOTE — Consult Note (Signed)
[facility] CONSULT NOTE    Date: 12/25/2022                  Patient Name:  Brett Blake  MRN: [id number]  DOB: May 12, 1941  Age / Sex: 81 y.o., male         PCP: [name], MD                 Service Requesting Consult: Critical care                 Reason for Consult: Acute kidney injury            History of Present Illness: Patient is a 81 y.o. male with a PMHx of advanced COPD, asthma, diabetes mellitus type 2, GERD, hyperlipidemia, hypertension, obstructive sleep apnea, anxiety, who was admitted to [facility] on 12/14/2022 for evaluation of severe tremors.  Patient has history of alcohol intake nightly.  He developed significant delirium tremens upon admission.  He is now intubated and unable to offer any history.  He had significant hypotension requiring pressors though he has been weaned off of pressors at the moment.  Urine output for the preceding 24 hours was only 225 cc.  Baseline creatinine is 0.96 at admission.  Creatinine now up to 3.09.  Potassium normal at 4.8.  Serum bicarbonate acceptable at 23.  Tube feeds have currently been held.   Medications: Outpatient medications: Medications Prior to Admission  Medication Sig Dispense Refill Last Dose   albuterol (ACCUNEB) 1.25 MG/3ML nebulizer solution Take 3 mLs by nebulization every 6 (six) hours as needed for wheezing. 75 mL 12 Past Week   ALPRAZolam (XANAX) 0.5 MG tablet Take 0.5 mg by mouth daily as needed for anxiety.   12/21/2022   aspirin EC 81 MG tablet Take 81 mg by mouth daily. Swallow whole.   12/21/2022   diltiazem (CARDIZEM CD) 240 MG 24 hr capsule Take 240 mg by mouth every morning.   12/21/2022   donepezil (ARICEPT) 5 MG tablet Take 5 mg by mouth at bedtime.   12/21/2022   fluticasone (FLONASE) 50 MCG/ACT nasal spray Place 1 spray into both nostrils daily as needed for allergies or rhinitis.   Past Week   folic acid (FOLVITE) 1 MG tablet Take 1 tablet (1 mg total) by mouth daily.  30 tablet 0 12/21/2022   gabapentin (NEURONTIN) 300 MG capsule Take 1 capsule (300 mg total) by mouth at bedtime. 30 capsule 0 12/21/2022   melatonin 5 MG TABS Take 1 tablet (5 mg total) by mouth at bedtime. 30 tablet 0 12/21/2022   nitroGLYCERIN (NITROSTAT) 0.4 MG SL tablet Place 0.4 mg under the tongue every 5 (five) minutes as needed for chest pain.   Past Month   omeprazole (PRILOSEC) 40 MG capsule Take 40 mg by mouth daily.   12/21/2022   ondansetron (ZOFRAN-ODT) 4 MG disintegrating tablet Take 1 tablet (4 mg total) by mouth every 8 (eight) hours as needed for nausea or vomiting. 20 tablet 0 12/21/2022   propranolol (INDERAL) 40 MG tablet Take 40 mg by mouth 2 (two) times daily.   12/21/2022   QUEtiapine (SEROQUEL) 25 MG tablet One tab po qhs (can take extra dose if not sleeping) 45 tablet 0 12/21/2022   rosuvastatin (CRESTOR) 5 MG tablet Take 5 mg by mouth daily.   12/21/2022   SYMBICORT 80-4.5 MCG/ACT inhaler Inhale 2 puffs into the lungs 2 (two) times daily as needed.    Past Month  at prn   venlafaxine XR (EFFEXOR-XR) 37.5 MG 24 hr capsule Take 37.5 mg by mouth daily.   12/21/2022   azithromycin (ZITHROMAX) 250 MG tablet One tab po daily for three days (Patient not taking: Reported on 12/05/2022) 3 each 0 Not Taking   guaiFENesin (ROBITUSSIN) 100 MG/5ML liquid Take 5 mLs by mouth every 4 (four) hours as needed for cough or to loosen phlegm. (Patient not taking: Reported on 12/03/2022) 120 mL 0 Not Taking   polyethylene glycol (MIRALAX / GLYCOLAX) 17 g packet Take 17 g by mouth daily as needed for moderate constipation. (Patient not taking: Reported on 11/08/2022) 14 each 0     Current medications: Current Facility-Administered Medications  Medication Dose Route Frequency Provider Last Rate Last Admin   0.9 %  sodium chloride infusion  250 mL Intravenous Continuous Teressa Lower, NP       albuterol (PROVENTIL) (2.5 MG/3ML) 0.083% nebulizer solution 2.5 mg  2.5 mg Nebulization Q4H PRN  Alison Murray, RPH       budesonide (PULMICORT) nebulizer solution 0.5 mg  0.5 mg Nebulization BID Teressa Lower, NP   0.5 mg at 12/25/22 0805   chlordiazePOXIDE (LIBRIUM) capsule 10 mg  10 mg Per Tube TID Rust-Chester, Huel Cote, NP   10 mg at 12/25/22 1619   Chlorhexidine Gluconate Cloth 2 % PADS 6 each  6 each Topical Daily Lang Snow, NP   6 each at 12/24/22 2132   dextromethorphan-guaiFENesin ([place] DM) 30-600 MG per 12 hr tablet 1 tablet  1 tablet Oral BID PRN Ivor Costa, MD   1 tablet at 12/24/22 0816   docusate (COLACE) 50 MG/5ML liquid 100 mg  100 mg Per Tube BID Teressa Lower, NP   100 mg at 12/25/22 0940   famotidine (PEPCID) IVPB 20 mg premix  20 mg Intravenous Q24H Teressa Lower, NP   Paused at 12/25/22 1014   feeding supplement (PROSource TF20) liquid 60 mL  60 mL Per Tube TID Ottie Glazier, MD   60 mL at 12/25/22 1619   feeding supplement (VITAL HIGH PROTEIN) liquid 1,000 mL  1,000 mL Per Tube Q24H Ottie Glazier, MD   Infusion Verify at 12/25/22 1300   fentaNYL (SUBLIMAZE) bolus via infusion 25-100 mcg  25-100 mcg Intravenous Q15 min PRN Teressa Lower, NP       fentaNYL 2521mg in NS 2567m(1068mml) infusion-PREMIX  25-200 mcg/hr Intravenous Continuous NelTeressa LowerP   Stopped at 12/40/98/1149147folic acid (FOLVITE) tablet 1 mg  1 mg Oral Daily QuaDelman KittenD   1 mg at 12/25/22 0941   free water 30 mL  30 mL Per Tube Q4H Aleskerov, Fuad, MD   30 mL at 12/25/22 0412   heparin injection 5,000 Units  5,000 Units Subcutaneous Q8H Aleskerov, Fuad, MD       insulin aspart (novoLOG) injection 0-20 Units  0-20 Units Subcutaneous Q4H NelTeressa LowerP   4 Units at 12/25/22 1620   ipratropium-albuterol (DUONEB) 0.5-2.5 (3) MG/3ML nebulizer solution 3 mL  3 mL Nebulization Q4H NiuIvor CostaD   3 mL at 12/25/22 1505   multivitamin with minerals tablet 1 tablet  1 tablet Oral Daily QuaDelman KittenD   1 tablet at 12/25/22 0940   norepinephrine (LEVOPHED) '4mg'$  in 250m21m0.016 mg/mL) premix infusion  2-10 mcg/min Intravenous Titrated NelsTeressa Lower   Stopped at 12/25/22 1146   ondansetron (ZOFRAN) injection 4 mg  4  mg Intravenous Q8H PRN Ivor Costa, MD       Oral care mouth rinse  15 mL Mouth Rinse Q2H Aleskerov, Fuad, MD   15 mL at 12/25/22 1600   Oral care mouth rinse  15 mL Mouth Rinse PRN Ottie Glazier, MD       polyethylene glycol (MIRALAX / GLYCOLAX) packet 17 g  17 g Per Tube Daily Teressa Lower, NP   17 g at 12/25/22 0940   propofol (DIPRIVAN) 1000 MG/100ML infusion  0-50 mcg/kg/min Intravenous Continuous Teressa Lower, NP   Stopped at 12/25/22 1040   rosuvastatin (CRESTOR) tablet 5 mg  5 mg Per Tube Daily Teressa Lower, NP   5 mg at 12/25/22 0941   [START ON 12/26/2022] thiamine (VITAMIN B1) injection 100 mg  100 mg Intravenous Daily Rust-Chester, Huel Cote, NP          Allergies: Allergies  Allergen Reactions   Ace Inhibitors Cough   Metoprolol Diarrhea    Patient has been tolerating Propranolol 40 mg twice daily since at least 05/2022.   Nsaids Rash   Sulfa Antibiotics Rash      Past Medical History: Past Medical History:  Diagnosis Date   Anxiety    Arthritis    Asthma    Colon polyps    COPD (chronic obstructive pulmonary disease) ([place])    Depression    Diabetes ([place])    Difficult intubation    GERD (gastroesophageal reflux disease)    High cholesterol    Hyperlipidemia    Hypertension    Hyponatremia 02/23/2022   Sleep apnea      Past Surgical History: Past Surgical History:  Procedure Laterality Date   BRONCHOSCOPY     CARDIAC CATHETERIZATION Left 11/04/2016   Procedure: Left Heart Cath and Coronary Angiography;  Surgeon: Teodoro Spray, MD;  Location: [facility];  Service: Cardiovascular;  Laterality: Left;   CARDIAC CATHETERIZATION     CHOLECYSTECTOMY N/A 05/09/2019   Procedure: LAPAROSCOPIC CHOLECYSTECTOMY WITH INTRAOPERATIVE CHOLANGIOGRAM;  Surgeon: Jules Husbands, MD;  Location: [facility];  Service:  General;  Laterality: N/A;   COLOSTOMY     IR KYPHO LUMBAR INC FX REDUCE BONE BX UNI/BIL CANNULATION INC/IMAGING  11/10/2022   IR RADIOLOGIST EVAL & MGMT  11/08/2022   LEFT HEART CATH AND CORONARY ANGIOGRAPHY N/A 12/14/2021   Procedure: LEFT HEART CATH AND CORONARY ANGIOGRAPHY;  Surgeon: Corey Skains, MD;  Location: [facility];  Service: Cardiovascular;  Laterality: N/A;   ROTATOR CUFF REPAIR Left    THUMB ARTHROSCOPY  2015   TONSILLECTOMY  1952   TONSILLECTOMY       Family History: Family History  Problem Relation Age of Onset   Heart attack Mother    Prostate cancer Father    Leukemia Brother    Prostate cancer Brother      Social History: Social History   Socioeconomic History   Marital status: Married    Spouse name: Not on file   Number of children: Not on file   Years of education: Not on file   Highest education level: Not on file  Occupational History   Not on file  Tobacco Use   Smoking status: Former    Packs/day: 1.00    Years: 15.00    Total pack years: 15.00    Types: Cigarettes   Smokeless tobacco: Never   Tobacco comments:    quit 1977  Vaping Use   Vaping Use: Not on  file  Substance and Sexual Activity   Alcohol use: Yes    Alcohol/week: 1.0 - 2.0 standard drink of alcohol    Types: 1 - 2 Shots of liquor per week   Drug use: No   Sexual activity: Not Currently  Other Topics Concern   Not on file  Social History Narrative   Not on file   Social Determinants of Health   Financial Resource Strain: Not on file  Food Insecurity: Not on file  Transportation Needs: Not on file  Physical Activity: Not on file  Stress: Not on file  Social Connections: Not on file  Intimate Partner Violence: Not on file     Review of Systems: As per HPI  Vital Signs: Blood pressure 112/69, pulse (!) 111, temperature 98.4 F (36.9 C), temperature source Bladder, resp. rate 15, height '5\' 4"'$  (1.626 m), weight 80 kg, SpO2 100 %.  Weight  trends: Filed Weights   11/27/2022 1111 11/25/2022 2030  Weight: 78 kg 80 kg     Physical Exam: General: Critically ill-appearing  Head: Normocephalic, atraumatic.  Endotracheal tube in place  Eyes: Closed  Neck: Supple  Lungs:  Coarse breath sounds bilateral, vent assisted  Heart: S1S2 no rubs  Abdomen:  Soft, nontender, bowel sounds present  Extremities: Trace peripheral edema.  Neurologic: Intubated, not following commands  Skin: Upper lower extremity ecchymosis  Access: No hemodialysis access    Lab results: Basic Metabolic Panel: Recent Labs  Lab 12/19/2022 1149 12/11/2022 1150 12/23/22 0017 12/23/22 0603 12/24/22 0406 12/25/22 0410  NA  --    < >  --  138 138 138  K  --    < > 2.9* 4.1 4.4 4.8  CL  --    < >  --  105 104 106  CO2  --    < >  --  '24 23 23  '$ GLUCOSE  --    < >  --  178* 181* 139*  BUN  --    < >  --  11 18 33*  CREATININE  --    < >  --  0.96 1.95* 3.09*  CALCIUM  --    < >  --  7.5* 8.2* 8.0*  MG 1.6*  --  2.4 2.4 2.4  --   PHOS  --    < > 4.1 3.6 4.9* 5.7*   < > = values in this interval not displayed.    Liver Function Tests: Recent Labs  Lab 12/15/2022 1150 12/24/22 0406 12/25/22 0410  AST 81*  --   --   ALT 62*  --   --   ALKPHOS 85  --   --   BILITOT 1.2  --   --   PROT 6.4*  --   --   ALBUMIN 3.4* 2.7* 2.4*   No results for input(s): "LIPASE", "AMYLASE" in the last 168 hours. No results for input(s): "AMMONIA" in the last 168 hours.  CBC: Recent Labs  Lab 12/15/2022 1150 12/23/22 0603 12/24/22 0406 12/25/22 0410  WBC 3.9* 5.3 8.2 7.1  HGB 12.1* 10.3* 10.3* 9.2*  HCT 35.5* 31.0* 32.1* 29.0*  MCV 96.2 98.4 101.9* 103.6*  PLT 125* 100* 133* 129*    Cardiac Enzymes: Recent Labs  Lab 12/25/22 0409  CKTOTAL 81    BNP: Invalid input(s): "POCBNP"  CBG: Recent Labs  Lab 12/24/22 2339 12/25/22 0358 12/25/22 0742 12/25/22 1143 12/25/22 1608  GLUCAP 124* 140* 132* 152* 156*    Microbiology: Results  for orders placed or  performed during the hospital encounter of 12/24/2022  Resp Panel by RT-PCR (Flu A&B, Covid) Anterior Nasal Swab     Status: None   Collection Time: 12/05/2022  1:14 PM   Specimen: Anterior Nasal Swab  Result Value Ref Range Status   SARS Coronavirus 2 by RT PCR NEGATIVE NEGATIVE Final    Comment: (NOTE) SARS-CoV-2 target nucleic acids are NOT DETECTED.  The SARS-CoV-2 RNA is generally detectable in upper respiratory specimens during the acute phase of infection. The lowest concentration of SARS-CoV-2 viral copies this assay can detect is 138 copies/mL. A negative result does not preclude SARS-Cov-2 infection and should not be used as the sole basis for treatment or other patient management decisions. A negative result may occur with  improper specimen collection/handling, submission of specimen other than nasopharyngeal swab, presence of viral mutation(s) within the areas targeted by this assay, and inadequate number of viral copies(<138 copies/mL). A negative result must be combined with clinical observations, patient history, and epidemiological information. The expected result is Negative.  Fact Sheet for Patients:  EntrepreneurPulse.com.au  Fact Sheet for Healthcare Providers:  IncredibleEmployment.be  This test is no t yet approved or cleared by the [place] FDA and  has been authorized for detection and/or diagnosis of SARS-CoV-2 by FDA under an Emergency Use Authorization (EUA). This EUA will remain  in effect (meaning this test can be used) for the duration of the COVID-19 declaration under Section 564(b)(1) of the Act, 21 U.S.C.section 360bbb-3(b)(1), unless the authorization is terminated  or revoked sooner.       Influenza A by PCR NEGATIVE NEGATIVE Final   Influenza B by PCR NEGATIVE NEGATIVE Final    Comment: (NOTE) The Xpert Xpress SARS-CoV-2/FLU/RSV plus assay is intended as an aid in the diagnosis of influenza from  Nasopharyngeal swab specimens and should not be used as a sole basis for treatment. Nasal washings and aspirates are unacceptable for Xpert Xpress SARS-CoV-2/FLU/RSV testing.  Fact Sheet for Patients: EntrepreneurPulse.com.au  Fact Sheet for Healthcare Providers: IncredibleEmployment.be  This test is not yet approved or cleared by the [place] FDA and has been authorized for detection and/or diagnosis of SARS-CoV-2 by FDA under an Emergency Use Authorization (EUA). This EUA will remain in effect (meaning this test can be used) for the duration of the COVID-19 declaration under Section 564(b)(1) of the Act, 21 U.S.C. section 360bbb-3(b)(1), unless the authorization is terminated or revoked.  Performed at [facility], [address] [place], [place] [address]   MRSA Next Gen by PCR, Nasal     Status: None   Collection Time: 12/20/2022  8:43 PM   Specimen: Nasal Mucosa; Nasal Swab  Result Value Ref Range Status   MRSA by PCR Next Gen NOT DETECTED NOT DETECTED Final    Comment: (NOTE) The GeneXpert MRSA Assay (FDA approved for NASAL specimens only), is one component of a comprehensive MRSA colonization surveillance program. It is not intended to diagnose MRSA infection nor to guide or monitor treatment for MRSA infections. Test performance is not FDA approved in patients less than 46 years old. Performed at [facility], [address] [place], [place] [address]     Coagulation Studies: No results for input(s): "LABPROT", "INR" in the last 72 hours.  Urinalysis: Recent Labs    12/06/2022 1936  COLORURINE YELLOW*  LABSPEC 1.010  PHURINE 7.0  GLUCOSEU NEGATIVE  HGBUR LARGE*  BILIRUBINUR NEGATIVE  KETONESUR 5*  PROTEINUR 100*  NITRITE NEGATIVE  LEUKOCYTESUR NEGATIVE      Imaging: DG Abd 1 View  Result Date: 12/25/2022 CLINICAL DATA:  Abdominal distension EXAM: ABDOMEN - 1 VIEW COMPARISON:  12/03/2022  FINDINGS: Enteric tube terminates within the proximal stomach. Gaseous distension of the colon. No abnormally dilated loops of small bowel are seen. No gross free intraperitoneal air. Cholecystectomy clips. IMPRESSION: Gaseous distension of the colon. Enteric tube terminates within the proximal stomach. Electronically Signed   By: Davina Poke D.O.   On: 12/25/2022 11:24   DG Chest Port 1 View  Result Date: 12/24/2022 CLINICAL DATA:  Atelectasis EXAM: PORTABLE CHEST 1 VIEW COMPARISON:  12/05/2022 FINDINGS: Endotracheal tube terminates approximately 2.3 cm above the carina. Enteric tube courses below the diaphragm with distal tip beyond the inferior margin of the film. Stable heart size. Opacity at the left lung base may partially be secondary to prominent epicardial fat pad as seen on prior CT. Slightly low lung volumes with bibasilar atelectasis. No pneumothorax. IMPRESSION: 1. Endotracheal tube terminates approximately 2.3 cm above the carina. 2. Low lung volumes with bibasilar atelectasis. Electronically Signed   By: Davina Poke D.O.   On: 12/24/2022 09:40     Assessment & Plan: Pt is a 81 y.o. male  with a PMHx of advanced COPD, asthma, diabetes mellitus type 2, GERD, hyperlipidemia, hypertension, obstructive sleep apnea, anxiety, who was admitted to [facility] on 11/27/2022 for evaluation of severe tremors.  Patient has history of alcohol intake nightly.  He developed significant delirium tremens upon admission and subsequently intubated for airway protection.  1.  Acute kidney injury secondary to acute tubular necrosis from hypotension.  Patient was on pressors earlier in the course of admission but now weaned off.  Now oliguric.  Obtain renal ultrasound to make sure obstruction not playing a role.  No urgent indication for dialysis but this may need to be considered if oliguria persists.  Continue supportive care for now and avoid nephrotoxins as possible.  2.  Acute respiratory failure.   Patient maintained on the ventilator for airway protection.  Does not appear to be ready to be extubated at the moment.  3.  Delirium tremens.  Management as per critical care.  4.  Thanks for consultation.  Further plan as patient progresses.

## 2022-12-25 NOTE — Progress Notes (Signed)
Patient continues off sedation medication with no response to painful or verbal stimulus.  Patient remain at a Rass of -3, Dr. Lanney Gins has also been rounding on the patient during two hours off sedation.  Patient further continues with no urine output and Dr. Lanney Gins aware and placed consult for nephrology.  Patient continues off sedation at this time, will continue to monitor.

## 2022-12-25 NOTE — Progress Notes (Signed)
NAME:  Brett Blake, MRN:  [id number], DOB:  08-11-1941, LOS: 3 ADMISSION DATE:  11/29/2022, CONSULTATION DATE: 12/09/2022 REFERRING MD: Dr. Blaine Hamper, CHIEF COMPLAINT: AMS   History of Present Illness:  This is an 81 yo male with hx of ETOH Abuse who presented to [facility] ER on 12/28 via EMS from home following a possible seizure activity the morning of 12/28.  He has a hx of non epileptic seizures, however he does not take antiepileptics.  He reported he has not had a seizure in a long time.  All hx obtained from pts chart and daughter.   ED Course Upon arrival to the ER pt noted to have severe tremors.  Pt reported he drinks 1 small alcoholic beverage every night prior to going to bed.  However, pts wife reported he drank hard liquor heavily over the past several days and abruptly stopped drinking 2 days ago.  He also complained of an upset stomach.  ER lab results revealed K+ 3.1/glucose 138/calcium 8.3/albumin 3.4/AST 81/ALT 62/total protein 6.4/magnesium 1.6/wbc 3.9/hgb 12.1/platelets 125.  COVID-19/Influenza A&B negative.  Pt had a CIWA score of 4, he received 1 mg of iv ativan.  Hospitalist initially contacted for hospital admission.  However, pt became increasingly agitated and combative despite receiving a total of 3 mg of iv ativan and 3 mg of IM haldol.  Security called to bedside.  PCCM team consulted due to pt requiring precedex gtt.  However, despite precedex gtt at maximal dose; additional 2 mg of iv ativan, 12.5 mg of iv fentanyl, and 2.5 mg of iv haldol pt remained combative/agitated requiring mechanical intubation.  Pt admitted to ICU per PCCM team for additional workup and treatment.    CT Head 12/28:  No evidence of acute intracranial abnormality. Mild chronic small vessel ischemic disease. CXR 12/28: No active cardiopulmonary disease.   12/23/22- patient still having seizures despite propofol, the seizures do respond to Ativan.  Pharmacy is monitoring electrolytes.  He is weaning off  levophed.  He will start feeds today.  ABG with res alkalosis and RR was reduced.  12/24/22- patient improved, dcd phenobarb and wake up assessment was very poor today with onset of tachyarrythmia in 140s and seizure like activity upon reduction of sedation.  12/25/22-  patient is critically ill, he is not tolerating reduction of sedation with emergence of seizure like activity and tachyarrythmia. Wife and nephew at bedside we discussed short term plan. Today we have transitioned to herparin from lovenox due to worsening renal impairment.   Pertinent  Medical History  Anxiety  Arthritis  Asthma  Colon Polyps  COPD Depression  Type II Diabetes Mellitus Difficult Intubation  GERD  Hypercholesteremia  HLD HTN  Hyponatremia  OSA   Significant Hospital Events: Including procedures, antibiotic start and stop dates in addition to other pertinent events   12/28: Pt admitted with possible seizure activity suspect secondary to severe ETOH withdrawal requiring mechanical intubation    Objective   Blood pressure 95/62, pulse (!) 132, temperature (!) 100.6 F (38.1 C), resp. rate 16, height _0  (1.626 m), weight 80 kg, SpO2 96 %.    Vent Mode: PRVC FiO2 (%):  [24 %] 24 % Set Rate:  [16 bmp] 16 bmp Vt Set:  [400 mL] 400 mL PEEP:  [5 cmH20] 5 cmH20 Plateau Pressure:  [14 cmH20-23 cmH20] 14 cmH20   Intake/Output Summary (Last 24 hours) at 12/25/2022 0926 Last data filed at 12/25/2022 0600 Gross per 24 hour  Intake 2568.56 ml  Output 205 ml  Net 2363.56 ml    Filed Weights   12/21/2022 1111 12/21/2022 2030  Weight: 78 kg 80 kg    Examination: General: Acutely-ill appearing male, NAD mechanically intubated HENT: Supple, difficult to assess for JVD due to body habitus  Lungs: Diminished with expiratory wheezes, even, non labored  Cardiovascular: Sinus tachycardia, s1s2, no r/g, 2+ radial/1+ distal, no edema  Abdomen: +BS x4, obese, soft, non distended  Extremities: Normal bulk and  tone, moves all extremities  Neuro: Sedated, not following commands, PERRL  GU: Indwelling foley catheter draining blood tinged urine   Resolved Hospital Problem list     Assessment & Plan:   Mechanical intubation for airway protection in the setting of ETOH withdrawal  Hx: OSA, COPD, Asthma, and Difficult Intubation  - Full vent support for now: vent settings reviewed and established - Continue lung protective strategies - SBT once all parameters met  - IV steroids for airway edema in the setting of difficult intubation  - VAP bundle implemented  - Scheduled and prn bronchodilator therapy  - Nebulized steroids   HTN  Hx: Hypercholesteremia  - Continuous telemetry monitoring  - Check troponin's - Continue outpatient rosuvastatin   Hypokalemia  Hyponatremia  - Trend BMP  - Replace electrolytes as indicated  - Monitor UOP - Avoid nephrotoxic medications   Type II diabetes mellitus  - CBG's q4hrs  - SSI   DELERIUM TREMENS - Loading dose of keppra followed by maintenance keppra  - Seizure precautions -VERY HIGH SEDATION REQUIRMENTS - Maintain RASS goal -2 to -3 - Propofol and fentanyl gtt to maintain RASS goal  - WUA daily when appropriate  - EEG pending   Best Practice (right click and "Reselect all SmartList Selections" daily)   Diet/type: NPO DVT prophylaxis: LMWH GI prophylaxis: H2B Lines: N/A Foley:  Yes, and it is still needed Code Status:  full code Last date of multidisciplinary goals of care discussion [N/A]  Labs   CBC: Recent Labs  Lab 12/01/2022 1150 12/23/22 0603 12/24/22 0406 12/25/22 0410  WBC 3.9* 5.3 8.2 7.1  HGB 12.1* 10.3* 10.3* 9.2*  HCT 35.5* 31.0* 32.1* 29.0*  MCV 96.2 98.4 101.9* 103.6*  PLT 125* 100* 133* 129*     Basic Metabolic Panel: Recent Labs  Lab 12/19/2022 1149 12/07/2022 1150 12/23/22 0017 12/23/22 0603 12/24/22 0406 12/25/22 0410  NA  --  139  --  138 138 138  K  --  3.1* 2.9* 4.1 4.4 4.8  CL  --  101  --  105  104 106  CO2  --  24  --  _0 GLUCOSE  --  138*  --  178* 181* 139*  BUN  --  15  --  11 18 33*  CREATININE  --  1.13  --  0.96 1.95* 3.09*  CALCIUM  --  8.3*  --  7.5* 8.2* 8.0*  MG 1.6*  --  2.4 2.4 2.4  --   PHOS  --  3.1 4.1 3.6 4.9* 5.7*    GFR: Estimated Creatinine Clearance: 18.2 mL/min (A) (by C-G formula based on SCr of 3.09 mg/dL (H)). Recent Labs  Lab 12/06/2022 1150 12/23/22 0603 12/24/22 0406 12/25/22 0410  WBC 3.9* 5.3 8.2 7.1     Liver Function Tests: Recent Labs  Lab 11/29/2022 1150 12/24/22 0406 12/25/22 0410  AST 81*  --   --   ALT 62*  --   --   ALKPHOS 85  --   --  BILITOT 1.2  --   --   PROT 6.4*  --   --   ALBUMIN 3.4* 2.7* 2.4*    No results for input(s): "LIPASE", "AMYLASE" in the last 168 hours. No results for input(s): "AMMONIA" in the last 168 hours.  ABG    Component Value Date/Time   PHART 7.41 12/23/2022 0423   PCO2ART 46 12/23/2022 0423   PO2ART 81 (L) 12/23/2022 0423   HCO3 29.1 (H) 12/23/2022 0423   O2SAT 98.5 12/23/2022 0423     Coagulation Profile: No results for input(s): "INR", "PROTIME" in the last 168 hours.  Cardiac Enzymes: No results for input(s): "CKTOTAL", "CKMB", "CKMBINDEX", "TROPONINI" in the last 168 hours.  HbA1C: Hgb A1c MFr Bld  Date/Time Value Ref Range Status  11/20/2022 05:04 AM 7.4 (H) 4.8 - 5.6 % Final    Comment:    (NOTE)         Prediabetes: 5.7 - 6.4         Diabetes: >6.4         Glycemic control for adults with diabetes: <7.0   02/15/2022 05:57 AM 5.9 (H) 4.8 - 5.6 % Final    Comment:    (NOTE)         Prediabetes: 5.7 - 6.4         Diabetes: >6.4         Glycemic control for adults with diabetes: <7.0     CBG: Recent Labs  Lab 12/24/22 1711 12/24/22 1926 12/24/22 2339 12/25/22 0358 12/25/22 0742  GLUCAP 184* 162* 124* 140* 132*     Review of Systems:   Unable to assess pt mechanically intubated   Past Medical History:  He,  has a past medical history of Anxiety,  Arthritis, Asthma, Colon polyps, COPD (chronic obstructive pulmonary disease) ([place]), Depression, Diabetes ([place]), Difficult intubation, GERD (gastroesophageal reflux disease), High cholesterol, Hyperlipidemia, Hypertension, Hyponatremia (02/23/2022), and Sleep apnea.   Surgical History:   Past Surgical History:  Procedure Laterality Date   BRONCHOSCOPY     CARDIAC CATHETERIZATION Left 11/04/2016   Procedure: Left Heart Cath and Coronary Angiography;  Surgeon: Teodoro Spray, MD;  Location: [place] [facility];  Service: Cardiovascular;  Laterality: Left;   CARDIAC CATHETERIZATION     CHOLECYSTECTOMY N/A 05/09/2019   Procedure: LAPAROSCOPIC CHOLECYSTECTOMY WITH INTRAOPERATIVE CHOLANGIOGRAM;  Surgeon: Jules Husbands, MD;  Location: [facility];  Service: General;  Laterality: N/A;   COLOSTOMY     IR KYPHO LUMBAR INC FX REDUCE BONE BX UNI/BIL CANNULATION INC/IMAGING  11/10/2022   IR RADIOLOGIST EVAL & MGMT  11/08/2022   LEFT HEART CATH AND CORONARY ANGIOGRAPHY N/A 12/14/2021   Procedure: LEFT HEART CATH AND CORONARY ANGIOGRAPHY;  Surgeon: Corey Skains, MD;  Location: [place] [facility];  Service: Cardiovascular;  Laterality: N/A;   ROTATOR CUFF REPAIR Left    THUMB ARTHROSCOPY  2015   TONSILLECTOMY  1952   TONSILLECTOMY       Social History:   reports that he has quit smoking. His smoking use included cigarettes. He has a 15.00 pack-year smoking history. He has never used smokeless tobacco. He reports current alcohol use of about 1.0 - 2.0 standard drink of alcohol per week. He reports that he does not use drugs.   Family History:  His family history includes Heart attack in his mother; Leukemia in his brother; Prostate cancer in his brother and father.   Allergies Allergies  Allergen Reactions   Ace Inhibitors Cough   Metoprolol  Diarrhea    Patient has been tolerating Propranolol 40 mg twice daily since at least 05/2022.   Nsaids Rash   Sulfa Antibiotics Rash     Home  Medications  Prior to Admission medications   Medication Sig Start Date End Date Taking? Authorizing Provider  albuterol (ACCUNEB) 1.25 MG/3ML nebulizer solution Take 3 mLs by nebulization every 6 (six) hours as needed for wheezing. 11/20/22  Yes Wieting, Richard, MD  ALPRAZolam Duanne Moron) 0.5 MG tablet Take 0.5 mg by mouth daily as needed for anxiety. 10/06/22 10/06/23 Yes [provider]  aspirin EC 81 MG tablet Take 81 mg by mouth daily. Swallow whole.   Yes [provider]  diltiazem (CARDIZEM CD) 240 MG 24 hr capsule Take 240 mg by mouth every morning. 12/07/21  Yes [provider]  donepezil (ARICEPT) 5 MG tablet Take 5 mg by mouth at bedtime. 05/10/22  Yes [provider]  fluticasone (FLONASE) 50 MCG/ACT nasal spray Place 1 spray into both nostrils daily as needed for allergies or rhinitis.   Yes [provider]  folic acid (FOLVITE) 1 MG tablet Take 1 tablet (1 mg total) by mouth daily. 03/01/22  Yes Wieting, Richard, MD  gabapentin (NEURONTIN) 300 MG capsule Take 1 capsule (300 mg total) by mouth at bedtime. 02/28/22  Yes Wieting, Richard, MD  melatonin 5 MG TABS Take 1 tablet (5 mg total) by mouth at bedtime. 02/28/22  Yes Wieting, Richard, MD  nitroGLYCERIN (NITROSTAT) 0.4 MG SL tablet Place 0.4 mg under the tongue every 5 (five) minutes as needed for chest pain.   Yes [provider]  omeprazole (PRILOSEC) 40 MG capsule Take 40 mg by mouth daily. 04/20/22  Yes [provider]  ondansetron (ZOFRAN-ODT) 4 MG disintegrating tablet Take 1 tablet (4 mg total) by mouth every 8 (eight) hours as needed for nausea or vomiting. 06/11/22  Yes Carrie Mew, MD  propranolol (INDERAL) 40 MG tablet Take 40 mg by mouth 2 (two) times daily. 11/07/22  Yes [provider]  QUEtiapine (SEROQUEL) 25 MG tablet One tab po qhs (can take extra dose if not sleeping) 02/28/22  Yes Wieting, Richard, MD  rosuvastatin (CRESTOR) 5 MG tablet Take 5 mg by  mouth daily. 05/10/22  Yes [provider]  SYMBICORT 80-4.5 MCG/ACT inhaler Inhale 2 puffs into the lungs 2 (two) times daily as needed.  05/19/20  Yes [provider]  venlafaxine XR (EFFEXOR-XR) 37.5 MG 24 hr capsule Take 37.5 mg by mouth daily. 12/07/21  Yes [provider]  azithromycin (ZITHROMAX) 250 MG tablet One tab po daily for three days Patient not taking: Reported on 12/18/2022 11/21/22   Loletha Grayer, MD  guaiFENesin (ROBITUSSIN) 100 MG/5ML liquid Take 5 mLs by mouth every 4 (four) hours as needed for cough or to loosen phlegm. Patient not taking: Reported on 12/10/2022 02/28/22   Loletha Grayer, MD  polyethylene glycol (MIRALAX / GLYCOLAX) 17 g packet Take 17 g by mouth daily as needed for moderate constipation. Patient not taking: Reported on 11/08/2022 02/28/22   Loletha Grayer, MD    Critical care provider statement:   Total critical care time: 33 minutes   Performed by: Lanney Gins MD   Critical care time was exclusive of separately billable procedures and treating other patients.   Critical care was necessary to treat or prevent imminent or life-threatening deterioration.   Critical care was time spent personally by me on the following activities: development of treatment plan with patient and/or surrogate as well  as nursing, discussions with consultants, evaluation of patient's response to treatment, examination of patient, obtaining history from patient or surrogate, ordering and performing treatments and interventions, ordering and review of laboratory studies, ordering and review of radiographic studies, pulse oximetry and re-evaluation of patient's condition.    Ottie Glazier, M.D.  Pulmonary & Critical Care Medicine

## 2022-12-25 NOTE — Progress Notes (Signed)
Pt still too sleepy to do SBT at this time, will reassess

## 2022-12-25 NOTE — Progress Notes (Signed)
Per Dr. Lanney Gins, continue to hold feeds at this time.

## 2022-12-25 NOTE — Progress Notes (Signed)
PHARMACY CONSULT NOTE  Pharmacy Consult for Electrolyte Monitoring and Replacement   Recent Labs: Potassium (mmol/L)  Date Value  12/25/2022 4.8   Magnesium (mg/dL)  Date Value  12/24/2022 2.4   Calcium (mg/dL)  Date Value  12/25/2022 8.0 (L)   Albumin (g/dL)  Date Value  12/25/2022 2.4 (L)   Phosphorus (mg/dL)  Date Value  12/25/2022 5.7 (H)   Sodium (mmol/L)  Date Value  12/25/2022 138    Assessment: 81 y.o. male with medical history significant of alcohol abuse, hypertension, hyperlipidemia, diabetes mellitus, COPD, dementia, depression with anxiety, pseudoseizure, former smoker, who presents with seizure. Pharmacy is asked to follow and replace electrolytes while in CCU. Free water 30 ml q4H.   Goal of Therapy:  Electrolytes WNL  Plan:  ---no electrolyte replacement warranted today ---recheck electrolytes in am  Oswald Hillock ,PharmD Clinical Pharmacist 12/25/2022 9:05 AM

## 2022-12-25 NOTE — Plan of Care (Signed)
Continuing with plan of care. 

## 2022-12-25 NOTE — Progress Notes (Addendum)
Patient's abdomen noted to be distended and 1L of undigested content obtained when patient placed on LIS via OG tube, Dr. Lanney Gins notified and ordered KUB and for feeds to not resume at this time.

## 2022-12-26 LAB — RENAL FUNCTION PANEL
Albumin: 2.6 g/dL — ABNORMAL LOW (ref 3.5–5.0)
Anion gap: 12 (ref 5–15)
BUN: 46 mg/dL — ABNORMAL HIGH (ref 8–23)
CO2: 21 mmol/L — ABNORMAL LOW (ref 22–32)
Calcium: 8.6 mg/dL — ABNORMAL LOW (ref 8.9–10.3)
Chloride: 104 mmol/L (ref 98–111)
Creatinine, Ser: 3.15 mg/dL — ABNORMAL HIGH (ref 0.61–1.24)
GFR, Estimated: 19 mL/min — ABNORMAL LOW (ref >60)
Glucose, Bld: 119 mg/dL — ABNORMAL HIGH (ref 70–99)
Phosphorus: 5.9 mg/dL — ABNORMAL HIGH (ref 2.5–4.6)
Potassium: 4.6 mmol/L (ref 3.5–5.1)
Sodium: 137 mmol/L (ref 135–145)

## 2022-12-26 LAB — GLUCOSE, CAPILLARY
Glucose-Capillary: 106 mg/dL — ABNORMAL HIGH (ref 70–99)
Glucose-Capillary: 109 mg/dL — ABNORMAL HIGH (ref 70–99)
Glucose-Capillary: 117 mg/dL — ABNORMAL HIGH (ref 70–99)
Glucose-Capillary: 118 mg/dL — ABNORMAL HIGH (ref 70–99)
Glucose-Capillary: 121 mg/dL — ABNORMAL HIGH (ref 70–99)
Glucose-Capillary: 132 mg/dL — ABNORMAL HIGH (ref 70–99)

## 2022-12-26 LAB — CBC
HCT: 35.2 % — ABNORMAL LOW (ref 39.0–52.0)
Hemoglobin: 11.4 g/dL — ABNORMAL LOW (ref 13.0–17.0)
MCH: 33.4 pg (ref 26.0–34.0)
MCHC: 32.4 g/dL (ref 30.0–36.0)
MCV: 103.2 fL — ABNORMAL HIGH (ref 80.0–100.0)
Platelets: 118 10*3/uL — ABNORMAL LOW (ref 150–400)
RBC: 3.41 MIL/uL — ABNORMAL LOW (ref 4.22–5.81)
RDW: 16.6 % — ABNORMAL HIGH (ref 11.5–15.5)
WBC: 6.3 10*3/uL (ref 4.0–10.5)
nRBC: 1.9 % — ABNORMAL HIGH (ref 0.0–0.2)

## 2022-12-26 LAB — TRIGLYCERIDES: Triglycerides: 170 mg/dL — ABNORMAL HIGH (ref <150)

## 2022-12-26 MED ORDER — METOPROLOL TARTRATE 5 MG/5ML IV SOLN
5.0000 mg | Freq: Once | INTRAVENOUS | Status: AC
  Administered 2022-12-26: 5 mg via INTRAVENOUS
  Filled 2022-12-26: qty 5

## 2022-12-26 MED ORDER — PROPRANOLOL HCL 20 MG PO TABS
40.0000 mg | ORAL_TABLET | Freq: Two times a day (BID) | ORAL | Status: DC
  Administered 2022-12-26: 40 mg via ORAL
  Filled 2022-12-26: qty 2

## 2022-12-26 NOTE — Progress Notes (Signed)
Blood pressure at 0430 was 190/107, notified Brett Falco NP in person, received verbal order for '5mg'$  of metoprolol. Per allergy list patient is allergic to this medication, but it only causes diarrhea, so I administered medication. It has not been effective yet, will continue to monitor.

## 2022-12-26 NOTE — Progress Notes (Signed)
[facility]  ROUNDING NOTE   Subjective:   Patient seen and evaluated at bedside in ICU Remains sedated on Fentanyl and Propofol Vent 28% FiO2 Trace lower extremity edema  Creatinine 3.15 UOP 632m  Objective:  Vital signs in last 24 hours:  Temp:  [97.3 F (36.3 C)-100.9 F (38.3 C)] 100.9 F (38.3 C) (01/01 0800) Pulse Rate:  [97-135] 127 (01/01 0800) Resp:  [11-24] 12 (01/01 0800) BP: (88-212)/(55-132) 102/63 (01/01 0800) SpO2:  [93 %-100 %] 99 % (01/01 0800) FiO2 (%):  [24 %-28 %] 28 % (01/01 0749)  Weight change:  Filed Weights   12/23/2022 1111 12/20/2022 2030  Weight: 78 kg 80 kg    Intake/Output: I/O last 3 completed shifts: In: 1755.6 [I.V.:603.5; NG/GT:330; IV Piggyback:822.2] Out: 1760 [Urine:760; Emesis/NG output:1000]   Intake/Output this shift:  No intake/output data recorded.  Physical Exam: General: Ill appearing  Head: Normocephalic, atraumatic.   Eyes: Anicteric  Lungs:  Scattered rhonchi, vent assisted  Heart: S1S2 present, tachycardic  Abdomen:  Soft, nontender  Extremities: Trace peripheral edema.  Neurologic: Sedated  Skin: No lesions  Access: None    Basic Metabolic Panel: Recent Labs  Lab 11/30/2022 1149 11/27/2022 1150 12/23/22 0017 12/23/22 0603 12/24/22 0406 12/25/22 0410 12/25/22 2159 12/26/22 0440  NA  --    < >  --  138 138 138 135 137  K  --    < > 2.9* 4.1 4.4 4.8 4.9 4.6  CL  --    < >  --  105 104 106 104 104  CO2  --    < >  --  '24 23 23 '$ 20* 21*  GLUCOSE  --    < >  --  178* 181* 139* 158* 119*  BUN  --    < >  --  11 18 33* 42* 46*  CREATININE  --    < >  --  0.96 1.95* 3.09* 3.29* 3.15*  CALCIUM  --    < >  --  7.5* 8.2* 8.0* 8.1* 8.6*  MG 1.6*  --  2.4 2.4 2.4  --  2.2  --   PHOS  --    < > 4.1 3.6 4.9* 5.7*  --  5.9*   < > = values in this interval not displayed.    Liver Function Tests: Recent Labs  Lab 11/28/2022 1150 12/24/22 0406 12/25/22 0410 12/26/22 0440  AST 81*  --   --   --   ALT  62*  --   --   --   ALKPHOS 85  --   --   --   BILITOT 1.2  --   --   --   PROT 6.4*  --   --   --   ALBUMIN 3.4* 2.7* 2.4* 2.6*   No results for input(s): "LIPASE", "AMYLASE" in the last 168 hours. No results for input(s): "AMMONIA" in the last 168 hours.  CBC: Recent Labs  Lab 12/04/2022 1150 12/23/22 0603 12/24/22 0406 12/25/22 0410 12/26/22 0440  WBC 3.9* 5.3 8.2 7.1 6.3  HGB 12.1* 10.3* 10.3* 9.2* 11.4*  HCT 35.5* 31.0* 32.1* 29.0* 35.2*  MCV 96.2 98.4 101.9* 103.6* 103.2*  PLT 125* 100* 133* 129* 118*    Cardiac Enzymes: Recent Labs  Lab 12/25/22 0409  CKTOTAL 81    BNP: Invalid input(s): "POCBNP"  CBG: Recent Labs  Lab 12/25/22 1608 12/25/22 1926 12/25/22 2317 12/26/22 0413 12/26/22 0728  GLUCAP 156* 159* 144* 118* 109*  Microbiology: Results for orders placed or performed during the hospital encounter of 12/21/2022  Resp Panel by RT-PCR (Flu A&B, Covid) Anterior Nasal Swab     Status: None   Collection Time: 12/25/2022  1:14 PM   Specimen: Anterior Nasal Swab  Result Value Ref Range Status   SARS Coronavirus 2 by RT PCR NEGATIVE NEGATIVE Final    Comment: (NOTE) SARS-CoV-2 target nucleic acids are NOT DETECTED.  The SARS-CoV-2 RNA is generally detectable in upper respiratory specimens during the acute phase of infection. The lowest concentration of SARS-CoV-2 viral copies this assay can detect is 138 copies/mL. A negative result does not preclude SARS-Cov-2 infection and should not be used as the sole basis for treatment or other patient management decisions. A negative result may occur with  improper specimen collection/handling, submission of specimen other than nasopharyngeal swab, presence of viral mutation(s) within the areas targeted by this assay, and inadequate number of viral copies(<138 copies/mL). A negative result must be combined with clinical observations, patient history, and epidemiological information. The expected result is  Negative.  Fact Sheet for Patients:  EntrepreneurPulse.com.au  Fact Sheet for Healthcare Providers:  IncredibleEmployment.be  This test is no t yet approved or cleared by the [place] FDA and  has been authorized for detection and/or diagnosis of SARS-CoV-2 by FDA under an Emergency Use Authorization (EUA). This EUA will remain  in effect (meaning this test can be used) for the duration of the COVID-19 declaration under Section 564(b)(1) of the Act, 21 U.S.C.section 360bbb-3(b)(1), unless the authorization is terminated  or revoked sooner.       Influenza A by PCR NEGATIVE NEGATIVE Final   Influenza B by PCR NEGATIVE NEGATIVE Final    Comment: (NOTE) The Xpert Xpress SARS-CoV-2/FLU/RSV plus assay is intended as an aid in the diagnosis of influenza from Nasopharyngeal swab specimens and should not be used as a sole basis for treatment. Nasal washings and aspirates are unacceptable for Xpert Xpress SARS-CoV-2/FLU/RSV testing.  Fact Sheet for Patients: EntrepreneurPulse.com.au  Fact Sheet for Healthcare Providers: IncredibleEmployment.be  This test is not yet approved or cleared by the [place] FDA and has been authorized for detection and/or diagnosis of SARS-CoV-2 by FDA under an Emergency Use Authorization (EUA). This EUA will remain in effect (meaning this test can be used) for the duration of the COVID-19 declaration under Section 564(b)(1) of the Act, 21 U.S.C. section 360bbb-3(b)(1), unless the authorization is terminated or revoked.  Performed at [facility], [address] [place], [place] [address]   MRSA Next Gen by PCR, Nasal     Status: None   Collection Time: 12/25/2022  8:43 PM   Specimen: Nasal Mucosa; Nasal Swab  Result Value Ref Range Status   MRSA by PCR Next Gen NOT DETECTED NOT DETECTED Final    Comment: (NOTE) The GeneXpert MRSA Assay (FDA approved for NASAL  specimens only), is one component of a comprehensive MRSA colonization surveillance program. It is not intended to diagnose MRSA infection nor to guide or monitor treatment for MRSA infections. Test performance is not FDA approved in patients less than 46 years old. Performed at [facility], [address] [place], [place] [address]     Coagulation Studies: No results for input(s): "LABPROT", "INR" in the last 72 hours.  Urinalysis: No results for input(s): "COLORURINE", "LABSPEC", "PHURINE", "GLUCOSEU", "HGBUR", "BILIRUBINUR", "KETONESUR", "PROTEINUR", "UROBILINOGEN", "NITRITE", "LEUKOCYTESUR" in the last 72 hours.  Invalid input(s): "APPERANCEUR"    Imaging: US RENAL  Result Date:  12/25/2022 CLINICAL DATA:  Acute renal failure. EXAM: RENAL / URINARY TRACT ULTRASOUND COMPLETE COMPARISON:  None Available. FINDINGS: Right Kidney: Renal measurements: 12.4 cm x 5.9 cm x 5.1 cm = volume: 194.4 mL. Echogenicity within normal limits. No mass or hydronephrosis visualized. Left Kidney: Renal measurements: 13.2 cm x 5.9 cm x 5.1 cm = volume: 208 mL. Echogenicity within normal limits. No mass or hydronephrosis visualized. Bladder: The urinary bladder is empty and subsequently limited in evaluation. Other: None. IMPRESSION: Unremarkable renal ultrasound. Electronically Signed   By: Virgina Norfolk M.D.   On: 12/25/2022 18:29   DG Abd 1 View  Result Date: 12/25/2022 CLINICAL DATA:  Abdominal distension EXAM: ABDOMEN - 1 VIEW COMPARISON:  12/15/2022 FINDINGS: Enteric tube terminates within the proximal stomach. Gaseous distension of the colon. No abnormally dilated loops of small bowel are seen. No gross free intraperitoneal air. Cholecystectomy clips. IMPRESSION: Gaseous distension of the colon. Enteric tube terminates within the proximal stomach. Electronically Signed   By: Davina Poke D.O.   On: 12/25/2022 11:24     Medications:    sodium chloride     famotidine (PEPCID) IV  Stopped (12/25/22 1014)   fentaNYL infusion INTRAVENOUS 50 mcg/hr (12/26/22 0712)   norepinephrine (LEVOPHED) Adult infusion Stopped (12/25/22 1146)   propofol (DIPRIVAN) infusion 20 mcg/kg/min (12/26/22 0541)    budesonide (PULMICORT) nebulizer solution  0.5 mg Nebulization BID   chlordiazePOXIDE  10 mg Per Tube TID   Chlorhexidine Gluconate Cloth  6 each Topical Daily   docusate  100 mg Per Tube BID   feeding supplement (PROSource TF20)  60 mL Per Tube TID   feeding supplement (VITAL HIGH PROTEIN)  1,000 mL Per Tube O35K   folic acid  1 mg Oral Daily   free water  30 mL Per Tube Q4H   heparin injection (subcutaneous)  5,000 Units Subcutaneous Q8H   insulin aspart  0-20 Units Subcutaneous Q4H   ipratropium-albuterol  3 mL Nebulization Q4H   multivitamin with minerals  1 tablet Oral Daily   mouth rinse  15 mL Mouth Rinse Q2H   polyethylene glycol  17 g Per Tube Daily   rosuvastatin  5 mg Per Tube Daily   thiamine (VITAMIN B1) injection  100 mg Intravenous Daily   albuterol, dextromethorphan-guaiFENesin, fentaNYL, ondansetron (ZOFRAN) IV, mouth rinse  Assessment/ Plan:  Mr. Candon Caras is a 82 y.o.  male with a PMHx of advanced COPD, asthma, diabetes mellitus type 2, GERD, hyperlipidemia, hypertension, obstructive sleep apnea, anxiety, who was admitted to [facility] on 12/10/2022 for evaluation of severe tremors.  Patient has history of alcohol intake nightly.  He developed significant delirium tremens upon admission and subsequently intubated for airway protection.    Acute kidney injury secondary to acute tubular necrosis from hypotension. Patient was on pressors earlier in the course of admission but now weaned off. Now oliguric.   Creatinine slightly improved with urine output.  610 mL recorded in preceding 24 hours.  Renal ultrasound negative for obstruction.  No acute indication for dialysis at this time however monitoring closely.  If urine output and renal function deteriorate,  may consider initiating.  Lab Results  Component Value Date   CREATININE 3.15 (H) 12/26/2022   CREATININE 3.29 (H) 12/25/2022   CREATININE 3.09 (H) 12/25/2022    Intake/Output Summary (Last 24 hours) at 12/26/2022 0928 Last data filed at 12/26/2022 0600 Gross per 24 hour  Intake 166.74 ml  Output 1610 ml  Net -1443.26 ml  2.  Acute respiratory failure.  Remains intubated on vent at 28% FiO2.   3.  Delirium tremens.  Continue fentanyl and propofol per primary team.      LOS: 4   1/1/20249:28 AM

## 2022-12-26 NOTE — Progress Notes (Signed)
NAME:  Brett Blake, MRN:  [id number], DOB:  03-25-1941, LOS: 4 ADMISSION DATE:  11/28/2022, CONSULTATION DATE: 11/29/2022 REFERRING MD: Dr. Blaine Hamper, CHIEF COMPLAINT: AMS   History of Present Illness:  This is an 82 yo male with hx of ETOH Abuse who presented to [facility] ER on 12/28 via EMS from home following a possible seizure activity the morning of 12/28.  He has a hx of non epileptic seizures, however he does not take antiepileptics.  He reported he has not had a seizure in a long time.  All hx obtained from pts chart and daughter.   ED Course Upon arrival to the ER pt noted to have severe tremors.  Pt reported he drinks 1 small alcoholic beverage every night prior to going to bed.  However, pts wife reported he drank hard liquor heavily over the past several days and abruptly stopped drinking 2 days ago.  He also complained of an upset stomach.  ER lab results revealed K+ 3.1/glucose 138/calcium 8.3/albumin 3.4/AST 81/ALT 62/total protein 6.4/magnesium 1.6/wbc 3.9/hgb 12.1/platelets 125.  COVID-19/Influenza A&B negative.  Pt had a CIWA score of 4, he received 1 mg of iv ativan.  Hospitalist initially contacted for hospital admission.  However, pt became increasingly agitated and combative despite receiving a total of 3 mg of iv ativan and 3 mg of IM haldol.  Security called to bedside.  PCCM team consulted due to pt requiring precedex gtt.  However, despite precedex gtt at maximal dose; additional 2 mg of iv ativan, 12.5 mg of iv fentanyl, and 2.5 mg of iv haldol pt remained combative/agitated requiring mechanical intubation.  Pt admitted to ICU per PCCM team for additional workup and treatment.    CT Head 12/28:  No evidence of acute intracranial abnormality. Mild chronic small vessel ischemic disease. CXR 12/28: No active cardiopulmonary disease.   12/23/22- patient still having seizures despite propofol, the seizures do respond to Ativan.  Pharmacy is monitoring electrolytes.  He is weaning off  levophed.  He will start feeds today.  ABG with res alkalosis and RR was reduced.  12/24/22- patient improved, dcd phenobarb and wake up assessment was very poor today with onset of tachyarrythmia in 140s and seizure like activity upon reduction of sedation.  12/25/22-  patient is critically ill, he is not tolerating reduction of sedation with emergence of seizure like activity and tachyarrythmia. Wife and nephew at bedside we discussed short term plan. Today we have transitioned to herparin from lovenox due to worsening renal impairment.  12/26/22- patient was not able to tolerate reduction in sedation with devt of severe hypertension and tachyarrythmia  Pertinent  Medical History  Anxiety  Arthritis  Asthma  Colon Polyps  COPD Depression  Type II Diabetes Mellitus Difficult Intubation  GERD  Hypercholesteremia  HLD HTN  Hyponatremia  OSA   Significant Hospital Events: Including procedures, antibiotic start and stop dates in addition to other pertinent events   12/28: Pt admitted with possible seizure activity suspect secondary to severe ETOH withdrawal requiring mechanical intubation    Objective   Blood pressure 133/83, pulse (!) 121, temperature 99.7 F (37.6 C), resp. rate 13, height _0  (1.626 m), weight 80 kg, SpO2 100 %.    Vent Mode: PRVC FiO2 (%):  [24 %-28 %] 28 % Set Rate:  [16 bmp] 16 bmp Vt Set:  [400 mL] 400 mL PEEP:  [5 cmH20] 5 cmH20 Plateau Pressure:  [11 cmH20-18 cmH20] 18 cmH20   Intake/Output Summary (Last 24 hours)  at 12/26/2022 1250 Last data filed at 12/26/2022 1104 Gross per 24 hour  Intake 585.12 ml  Output 760 ml  Net -174.88 ml    Filed Weights   12/21/2022 1111 12/21/2022 2030  Weight: 78 kg 80 kg    Examination: General: Acutely-ill appearing male, NAD mechanically intubated HENT: Supple, difficult to assess for JVD due to body habitus  Lungs: Diminished with expiratory wheezes, even, non labored  Cardiovascular: Sinus tachycardia, s1s2, no  r/g, 2+ radial/1+ distal, no edema  Abdomen: +BS x4, obese, soft, non distended  Extremities: Normal bulk and tone, moves all extremities  Neuro: Sedated, not following commands, PERRL  GU: Indwelling foley catheter draining blood tinged urine   Resolved Hospital Problem list     Assessment & Plan:   Mechanical intubation for airway protection in the setting of ETOH withdrawal  Hx: OSA, COPD, Asthma, and Difficult Intubation  - Full vent support for now: vent settings reviewed and established - Continue lung protective strategies - SBT once all parameters met  - IV steroids for airway edema in the setting of difficult intubation  - VAP bundle implemented  - Scheduled and prn bronchodilator therapy  - Nebulized steroids   HTN  Hx: Hypercholesteremia  - Continuous telemetry monitoring  - Check troponin's - Continue outpatient rosuvastatin   Hypokalemia  Hyponatremia  - Trend BMP  - Replace electrolytes as indicated  - Monitor UOP - Avoid nephrotoxic medications   Type II diabetes mellitus  - CBG's q4hrs  - SSI   DELERIUM TREMENS - Loading dose of keppra followed by maintenance keppra  - Seizure precautions -VERY HIGH SEDATION REQUIRMENTS - Maintain RASS goal -2 to -3 - Propofol and fentanyl gtt to maintain RASS goal  - WUA daily when appropriate  - EEG pending   Best Practice (right click and "Reselect all SmartList Selections" daily)   Diet/type: NPO DVT prophylaxis: LMWH GI prophylaxis: H2B Lines: N/A Foley:  Yes, and it is still needed Code Status:  full code Last date of multidisciplinary goals of care discussion [N/A]  Labs   CBC: Recent Labs  Lab 12/18/2022 1150 12/23/22 0603 12/24/22 0406 12/25/22 0410 12/26/22 0440  WBC 3.9* 5.3 8.2 7.1 6.3  HGB 12.1* 10.3* 10.3* 9.2* 11.4*  HCT 35.5* 31.0* 32.1* 29.0* 35.2*  MCV 96.2 98.4 101.9* 103.6* 103.2*  PLT 125* 100* 133* 129* 118*     Basic Metabolic Panel: Recent Labs  Lab 11/30/2022 1149  12/23/2022 1150 12/23/22 0017 12/23/22 0603 12/24/22 0406 12/25/22 0410 12/25/22 2159 12/26/22 0440  NA  --    < >  --  138 138 138 135 137  K  --    < > 2.9* 4.1 4.4 4.8 4.9 4.6  CL  --    < >  --  105 104 106 104 104  CO2  --    < >  --  _0 20* 21*  GLUCOSE  --    < >  --  178* 181* 139* 158* 119*  BUN  --    < >  --  11 18 33* 42* 46*  CREATININE  --    < >  --  0.96 1.95* 3.09* 3.29* 3.15*  CALCIUM  --    < >  --  7.5* 8.2* 8.0* 8.1* 8.6*  MG 1.6*  --  2.4 2.4 2.4  --  2.2  --   PHOS  --    < > 4.1 3.6 4.9* 5.7*  --  5.9*   < > = values in this interval not displayed.    GFR: Estimated Creatinine Clearance: 17.9 mL/min (A) (by C-G formula based on SCr of 3.15 mg/dL (H)). Recent Labs  Lab 12/23/22 0603 12/24/22 0406 12/25/22 0410 12/26/22 0440  WBC 5.3 8.2 7.1 6.3     Liver Function Tests: Recent Labs  Lab 12/18/2022 1150 12/24/22 0406 12/25/22 0410 12/26/22 0440  AST 81*  --   --   --   ALT 62*  --   --   --   ALKPHOS 85  --   --   --   BILITOT 1.2  --   --   --   PROT 6.4*  --   --   --   ALBUMIN 3.4* 2.7* 2.4* 2.6*    No results for input(s): "LIPASE", "AMYLASE" in the last 168 hours. No results for input(s): "AMMONIA" in the last 168 hours.  ABG    Component Value Date/Time   PHART 7.41 12/23/2022 0423   PCO2ART 46 12/23/2022 0423   PO2ART 81 (L) 12/23/2022 0423   HCO3 29.1 (H) 12/23/2022 0423   O2SAT 98.5 12/23/2022 0423     Coagulation Profile: No results for input(s): "INR", "PROTIME" in the last 168 hours.  Cardiac Enzymes: Recent Labs  Lab 12/25/22 0409  CKTOTAL 81    HbA1C: Hgb A1c MFr Bld  Date/Time Value Ref Range Status  11/20/2022 05:04 AM 7.4 (H) 4.8 - 5.6 % Final    Comment:    (NOTE)         Prediabetes: 5.7 - 6.4         Diabetes: >6.4         Glycemic control for adults with diabetes: <7.0   02/15/2022 05:57 AM 5.9 (H) 4.8 - 5.6 % Final    Comment:    (NOTE)         Prediabetes: 5.7 - 6.4         Diabetes:  >6.4         Glycemic control for adults with diabetes: <7.0     CBG: Recent Labs  Lab 12/25/22 1926 12/25/22 2317 12/26/22 0413 12/26/22 0728 12/26/22 1142  GLUCAP 159* 144* 118* 109* 106*     Review of Systems:   Unable to assess pt mechanically intubated   Past Medical History:  He,  has a past medical history of Anxiety, Arthritis, Asthma, Colon polyps, COPD (chronic obstructive pulmonary disease) ([place]), Depression, Diabetes ([place]), Difficult intubation, GERD (gastroesophageal reflux disease), High cholesterol, Hyperlipidemia, Hypertension, Hyponatremia (02/23/2022), and Sleep apnea.   Surgical History:   Past Surgical History:  Procedure Laterality Date   BRONCHOSCOPY     CARDIAC CATHETERIZATION Left 11/04/2016   Procedure: Left Heart Cath and Coronary Angiography;  Surgeon: Teodoro Spray, MD;  Location: [facility];  Service: Cardiovascular;  Laterality: Left;   CARDIAC CATHETERIZATION     CHOLECYSTECTOMY N/A 05/09/2019   Procedure: LAPAROSCOPIC CHOLECYSTECTOMY WITH INTRAOPERATIVE CHOLANGIOGRAM;  Surgeon: Jules Husbands, MD;  Location: [facility];  Service: General;  Laterality: N/A;   COLOSTOMY     IR KYPHO LUMBAR INC FX REDUCE BONE BX UNI/BIL CANNULATION INC/IMAGING  11/10/2022   IR RADIOLOGIST EVAL & MGMT  11/08/2022   LEFT HEART CATH AND CORONARY ANGIOGRAPHY N/A 12/14/2021   Procedure: LEFT HEART CATH AND CORONARY ANGIOGRAPHY;  Surgeon: Corey Skains, MD;  Location: [facility];  Service: Cardiovascular;  Laterality: N/A;   ROTATOR CUFF REPAIR Left  THUMB ARTHROSCOPY  2015   TONSILLECTOMY  1952   TONSILLECTOMY       Social History:   reports that he has quit smoking. His smoking use included cigarettes. He has a 15.00 pack-year smoking history. He has never used smokeless tobacco. He reports current alcohol use of about 1.0 - 2.0 standard drink of alcohol per week. He reports that he does not use drugs.   Family History:  His family history  includes Heart attack in his mother; Leukemia in his brother; Prostate cancer in his brother and father.   Allergies Allergies  Allergen Reactions   Ace Inhibitors Cough   Metoprolol Diarrhea    Patient has been tolerating Propranolol 40 mg twice daily since at least 05/2022.   Nsaids Rash   Sulfa Antibiotics Rash     Home Medications  Prior to Admission medications   Medication Sig Start Date End Date Taking? Authorizing Provider  albuterol (ACCUNEB) 1.25 MG/3ML nebulizer solution Take 3 mLs by nebulization every 6 (six) hours as needed for wheezing. 11/20/22  Yes Wieting, Richard, MD  ALPRAZolam Duanne Moron) 0.5 MG tablet Take 0.5 mg by mouth daily as needed for anxiety. 10/06/22 10/06/23 Yes [provider]  aspirin EC 81 MG tablet Take 81 mg by mouth daily. Swallow whole.   Yes [provider]  diltiazem (CARDIZEM CD) 240 MG 24 hr capsule Take 240 mg by mouth every morning. 12/07/21  Yes [provider]  donepezil (ARICEPT) 5 MG tablet Take 5 mg by mouth at bedtime. 05/10/22  Yes [provider]  fluticasone (FLONASE) 50 MCG/ACT nasal spray Place 1 spray into both nostrils daily as needed for allergies or rhinitis.   Yes [provider]  folic acid (FOLVITE) 1 MG tablet Take 1 tablet (1 mg total) by mouth daily. 03/01/22  Yes Wieting, Richard, MD  gabapentin (NEURONTIN) 300 MG capsule Take 1 capsule (300 mg total) by mouth at bedtime. 02/28/22  Yes Wieting, Richard, MD  melatonin 5 MG TABS Take 1 tablet (5 mg total) by mouth at bedtime. 02/28/22  Yes Wieting, Richard, MD  nitroGLYCERIN (NITROSTAT) 0.4 MG SL tablet Place 0.4 mg under the tongue every 5 (five) minutes as needed for chest pain.   Yes [provider]  omeprazole (PRILOSEC) 40 MG capsule Take 40 mg by mouth daily. 04/20/22  Yes [provider]  ondansetron (ZOFRAN-ODT) 4 MG disintegrating tablet Take 1 tablet (4 mg total) by mouth every 8 (eight) hours as needed for nausea or  vomiting. 06/11/22  Yes Carrie Mew, MD  propranolol (INDERAL) 40 MG tablet Take 40 mg by mouth 2 (two) times daily. 11/07/22  Yes [provider]  QUEtiapine (SEROQUEL) 25 MG tablet One tab po qhs (can take extra dose if not sleeping) 02/28/22  Yes Wieting, Richard, MD  rosuvastatin (CRESTOR) 5 MG tablet Take 5 mg by mouth daily. 05/10/22  Yes [provider]  SYMBICORT 80-4.5 MCG/ACT inhaler Inhale 2 puffs into the lungs 2 (two) times daily as needed.  05/19/20  Yes [provider]  venlafaxine XR (EFFEXOR-XR) 37.5 MG 24 hr capsule Take 37.5 mg by mouth daily. 12/07/21  Yes [provider]  azithromycin (ZITHROMAX) 250 MG tablet One tab po daily for three days Patient not taking: Reported on 12/24/2022 11/21/22   Loletha Grayer, MD  guaiFENesin (ROBITUSSIN) 100 MG/5ML liquid Take 5 mLs by mouth every 4 (four) hours as needed for cough or to loosen phlegm. Patient not taking: Reported on 12/24/2022 02/28/22  Wieting, Richard, MD  polyethylene glycol (MIRALAX / GLYCOLAX) 17 g packet Take 17 g by mouth daily as needed for moderate constipation. Patient not taking: Reported on 11/08/2022 02/28/22   Loletha Grayer, MD    Critical care provider statement:   Total critical care time: 33 minutes   Performed by: Lanney Gins MD   Critical care time was exclusive of separately billable procedures and treating other patients.   Critical care was necessary to treat or prevent imminent or life-threatening deterioration.   Critical care was time spent personally by me on the following activities: development of treatment plan with patient and/or surrogate as well as nursing, discussions with consultants, evaluation of patient's response to treatment, examination of patient, obtaining history from patient or surrogate, ordering and performing treatments and interventions, ordering and review of laboratory studies, ordering and review of radiographic studies, pulse  oximetry and re-evaluation of patient's condition.    Ottie Glazier, M.D.  Pulmonary & Critical Care Medicine

## 2022-12-26 NOTE — TOC CM/SW Note (Signed)
CSW acknowledges SA consult. Patient currently on ventilator. Will follow progress and offer resources when appropriate.  Dayton Scrape, [place]

## 2022-12-26 NOTE — Progress Notes (Addendum)
Pt has 2 to 3 plus edema lower FA - started 2 IV's Lt upper arm due to condition on Lt lower arm- (also has some dressings )to give lower arm a rest. These IV's are too deep for vasopressors - RN aware.

## 2022-12-26 NOTE — Progress Notes (Signed)
Spoke with Dr A. Order to stop propofol and fentanyl. Patients blood pressure in the 200's. Orders to restart drips. Continue to monitor.

## 2022-12-26 NOTE — Progress Notes (Signed)
PHARMACY CONSULT NOTE  Pharmacy Consult for Electrolyte Monitoring and Replacement   Recent Labs: Potassium (mmol/L)  Date Value  12/26/2022 4.6   Magnesium (mg/dL)  Date Value  12/25/2022 2.2   Calcium (mg/dL)  Date Value  12/26/2022 8.6 (L)   Albumin (g/dL)  Date Value  12/26/2022 2.6 (L)   Phosphorus (mg/dL)  Date Value  12/26/2022 5.9 (H)   Sodium (mmol/L)  Date Value  12/26/2022 137    Assessment: 82 y.o. male with medical history significant of alcohol abuse, hypertension, hyperlipidemia, diabetes mellitus, COPD, dementia, depression with anxiety, pseudoseizure, former smoker, who presents with seizure. Pharmacy is asked to follow and replace electrolytes while in CCU. Free water 30 ml q4H.  -TF  Goal of Therapy:  Electrolytes WNL  Plan:  ---no electrolyte replacement warranted today ---recheck electrolytes in am  Noralee Space ,PharmD Clinical Pharmacist 12/26/2022 9:48 AM

## 2022-12-26 NOTE — Progress Notes (Signed)
During weaning sedation, patient became hypertensive and tachycardic. Dr. Loni Muse notified. Orders to restart sedation, patient is a RASS of -4. Family at bedside throughout the day. Continue to assess.

## 2022-12-26 DEATH — deceased

## 2022-12-27 DIAGNOSIS — R569 Unspecified convulsions: Secondary | ICD-10-CM | POA: Diagnosis not present

## 2022-12-27 LAB — CBC
HCT: 33.2 % — ABNORMAL LOW (ref 39.0–52.0)
Hemoglobin: 10.5 g/dL — ABNORMAL LOW (ref 13.0–17.0)
MCH: 32.9 pg (ref 26.0–34.0)
MCHC: 31.6 g/dL (ref 30.0–36.0)
MCV: 104.1 fL — ABNORMAL HIGH (ref 80.0–100.0)
Platelets: 139 10*3/uL — ABNORMAL LOW (ref 150–400)
RBC: 3.19 MIL/uL — ABNORMAL LOW (ref 4.22–5.81)
RDW: 17.2 % — ABNORMAL HIGH (ref 11.5–15.5)
WBC: 5.3 10*3/uL (ref 4.0–10.5)
nRBC: 5.3 % — ABNORMAL HIGH (ref 0.0–0.2)

## 2022-12-27 LAB — RENAL FUNCTION PANEL
Albumin: 2.2 g/dL — ABNORMAL LOW (ref 3.5–5.0)
Anion gap: 11 (ref 5–15)
BUN: 57 mg/dL — ABNORMAL HIGH (ref 8–23)
CO2: 24 mmol/L (ref 22–32)
Calcium: 8.5 mg/dL — ABNORMAL LOW (ref 8.9–10.3)
Chloride: 104 mmol/L (ref 98–111)
Creatinine, Ser: 2.42 mg/dL — ABNORMAL HIGH (ref 0.61–1.24)
GFR, Estimated: 26 mL/min — ABNORMAL LOW (ref >60)
Glucose, Bld: 97 mg/dL (ref 70–99)
Phosphorus: 4.7 mg/dL — ABNORMAL HIGH (ref 2.5–4.6)
Potassium: 4.5 mmol/L (ref 3.5–5.1)
Sodium: 139 mmol/L (ref 135–145)

## 2022-12-27 LAB — GLUCOSE, CAPILLARY
Glucose-Capillary: 100 mg/dL — ABNORMAL HIGH (ref 70–99)
Glucose-Capillary: 95 mg/dL (ref 70–99)
Glucose-Capillary: 90 mg/dL (ref 70–99)
Glucose-Capillary: 87 mg/dL (ref 70–99)
Glucose-Capillary: 112 mg/dL — ABNORMAL HIGH (ref 70–99)
Glucose-Capillary: 122 mg/dL — ABNORMAL HIGH (ref 70–99)
Glucose-Capillary: 133 mg/dL — ABNORMAL HIGH (ref 70–99)

## 2022-12-27 MED ORDER — VITAL AF 1.2 CAL PO LIQD
1000.0000 mL | ORAL | Status: DC
  Administered 2022-12-27: 1000 mL

## 2022-12-27 MED ORDER — PROSOURCE TF20 ENFIT COMPATIBL EN LIQD
60.0000 mL | Freq: Every day | ENTERAL | Status: DC
  Administered 2022-12-28 – 2023-01-02 (×6): 60 mL

## 2022-12-27 MED ORDER — DEXMEDETOMIDINE HCL IN NACL 400 MCG/100ML IV SOLN
0.0000 ug/kg/h | INTRAVENOUS | Status: DC
  Administered 2022-12-27 (×2): 0.4 ug/kg/h via INTRAVENOUS
  Administered 2022-12-28: 0.8 ug/kg/h via INTRAVENOUS
  Administered 2022-12-28: 0.9 ug/kg/h via INTRAVENOUS
  Administered 2022-12-28: 0.4 ug/kg/h via INTRAVENOUS
  Administered 2022-12-28: 1 ug/kg/h via INTRAVENOUS
  Filled 2022-12-27 (×6): qty 100

## 2022-12-27 MED ORDER — PROPRANOLOL HCL 20 MG PO TABS
40.0000 mg | ORAL_TABLET | Freq: Two times a day (BID) | ORAL | Status: DC
  Administered 2022-12-27 – 2022-12-30 (×7): 40 mg
  Filled 2022-12-27 (×7): qty 2

## 2022-12-27 MED ORDER — ADULT MULTIVITAMIN W/MINERALS CH
1.0000 | ORAL_TABLET | Freq: Every day | ORAL | Status: DC
  Administered 2022-12-27 – 2023-01-02 (×7): 1
  Filled 2022-12-27 (×7): qty 1

## 2022-12-27 MED ORDER — INSULIN ASPART 100 UNIT/ML IJ SOLN
0.0000 [IU] | Freq: Three times a day (TID) | INTRAMUSCULAR | Status: DC
  Administered 2022-12-28 (×2): 3 [IU] via SUBCUTANEOUS
  Administered 2022-12-28: 0.8 [IU] via SUBCUTANEOUS
  Filled 2022-12-27 (×4): qty 1

## 2022-12-27 MED ORDER — FOLIC ACID 1 MG PO TABS
1.0000 mg | ORAL_TABLET | Freq: Every day | ORAL | Status: DC
  Administered 2022-12-27 – 2023-01-08 (×12): 1 mg
  Filled 2022-12-27 (×12): qty 1

## 2022-12-27 NOTE — Progress Notes (Signed)
[facility]  ROUNDING NOTE   Subjective:   Patient seen and evaluated at bedside in ICU Sedation transitioned to precedex Vent 28% FiO2 Trace lower extremity edema  Creatinine 2.42 UOP 710m  Objective:  Vital signs in last 24 hours:  Temp:  [99 F (37.2 C)-101.1 F (38.4 C)] 99 F (37.2 C) (01/02 1100) Pulse Rate:  [92-142] 92 (01/02 1100) Resp:  [9-22] 22 (01/02 1100) BP: (118-190)/(67-100) 165/100 (01/02 1100) SpO2:  [95 %-100 %] 99 % (01/02 1100) FiO2 (%):  [28 %] 28 % (01/02 0724) Weight:  [85.3 kg] 85.3 kg (01/02 0500)  Weight change:  Filed Weights   12/09/2022 1111 11/30/2022 2030 12/27/22 0500  Weight: 78 kg 80 kg 85.3 kg    Intake/Output: I/O last 3 completed shifts: In: 1355.2 [I.V.:382; NG/GT:930; IV Piggyback:43.3] Out: 1160 [Urine:1160]   Intake/Output this shift:  Total I/O In: 205.1 [I.V.:154.7; NG/GT:7.5; IV Piggyback:43] Out: -   Physical Exam: General: Ill appearing  Head: Normocephalic, atraumatic.   Eyes: Anicteric  Lungs:  Scattered rhonchi, vent assisted  Heart: S1S2 present, tachycardic  Abdomen:  Soft, nontender  Extremities: Trace peripheral edema.  Neurologic: Sedated  Skin: No lesions  Access: None    Basic Metabolic Panel: Recent Labs  Lab 12/01/2022 1149 12/14/2022 1150 12/23/22 0017 12/23/22 0603 12/24/22 0406 12/25/22 0410 12/25/22 2159 12/26/22 0440 12/27/22 0416  NA  --    < >  --  138 138 138 135 137 139  K  --    < > 2.9* 4.1 4.4 4.8 4.9 4.6 4.5  CL  --    < >  --  105 104 106 104 104 104  CO2  --    < >  --  '24 23 23 '$ 20* 21* 24  GLUCOSE  --    < >  --  178* 181* 139* 158* 119* 97  BUN  --    < >  --  11 18 33* 42* 46* 57*  CREATININE  --    < >  --  0.96 1.95* 3.09* 3.29* 3.15* 2.42*  CALCIUM  --    < >  --  7.5* 8.2* 8.0* 8.1* 8.6* 8.5*  MG 1.6*  --  2.4 2.4 2.4  --  2.2  --   --   PHOS  --    < > 4.1 3.6 4.9* 5.7*  --  5.9* 4.7*   < > = values in this interval not displayed.     Liver  Function Tests: Recent Labs  Lab 11/26/2022 1150 12/24/22 0406 12/25/22 0410 12/26/22 0440 12/27/22 0416  AST 81*  --   --   --   --   ALT 62*  --   --   --   --   ALKPHOS 85  --   --   --   --   BILITOT 1.2  --   --   --   --   PROT 6.4*  --   --   --   --   ALBUMIN 3.4* 2.7* 2.4* 2.6* 2.2*    No results for input(s): "LIPASE", "AMYLASE" in the last 168 hours. No results for input(s): "AMMONIA" in the last 168 hours.  CBC: Recent Labs  Lab 12/23/22 0603 12/24/22 0406 12/25/22 0410 12/26/22 0440 12/27/22 0416  WBC 5.3 8.2 7.1 6.3 5.3  HGB 10.3* 10.3* 9.2* 11.4* 10.5*  HCT 31.0* 32.1* 29.0* 35.2* 33.2*  MCV 98.4 101.9* 103.6* 103.2* 104.1*  PLT 100*  133* 129* 118* 139*     Cardiac Enzymes: Recent Labs  Lab 12/25/22 0409  CKTOTAL 81     BNP: Invalid input(s): "POCBNP"  CBG: Recent Labs  Lab 12/26/22 1918 12/26/22 2331 12/27/22 0358 12/27/22 0402 12/27/22 0757  GLUCAP 132* 121* 95 100* 90     Microbiology: Results for orders placed or performed during the hospital encounter of 11/29/2022  Resp Panel by RT-PCR (Flu A&B, Covid) Anterior Nasal Swab     Status: None   Collection Time: 11/26/2022  1:14 PM   Specimen: Anterior Nasal Swab  Result Value Ref Range Status   SARS Coronavirus 2 by RT PCR NEGATIVE NEGATIVE Final    Comment: (NOTE) SARS-CoV-2 target nucleic acids are NOT DETECTED.  The SARS-CoV-2 RNA is generally detectable in upper respiratory specimens during the acute phase of infection. The lowest concentration of SARS-CoV-2 viral copies this assay can detect is 138 copies/mL. A negative result does not preclude SARS-Cov-2 infection and should not be used as the sole basis for treatment or other patient management decisions. A negative result may occur with  improper specimen collection/handling, submission of specimen other than nasopharyngeal swab, presence of viral mutation(s) within the areas targeted by this assay, and inadequate number  of viral copies(<138 copies/mL). A negative result must be combined with clinical observations, patient history, and epidemiological information. The expected result is Negative.  Fact Sheet for Patients:  EntrepreneurPulse.com.au  Fact Sheet for Healthcare Providers:  IncredibleEmployment.be  This test is no t yet approved or cleared by the [place] FDA and  has been authorized for detection and/or diagnosis of SARS-CoV-2 by FDA under an Emergency Use Authorization (EUA). This EUA will remain  in effect (meaning this test can be used) for the duration of the COVID-19 declaration under Section 564(b)(1) of the Act, 21 U.S.C.section 360bbb-3(b)(1), unless the authorization is terminated  or revoked sooner.       Influenza A by PCR NEGATIVE NEGATIVE Final   Influenza B by PCR NEGATIVE NEGATIVE Final    Comment: (NOTE) The Xpert Xpress SARS-CoV-2/FLU/RSV plus assay is intended as an aid in the diagnosis of influenza from Nasopharyngeal swab specimens and should not be used as a sole basis for treatment. Nasal washings and aspirates are unacceptable for Xpert Xpress SARS-CoV-2/FLU/RSV testing.  Fact Sheet for Patients: EntrepreneurPulse.com.au  Fact Sheet for Healthcare Providers: IncredibleEmployment.be  This test is not yet approved or cleared by the [place] FDA and has been authorized for detection and/or diagnosis of SARS-CoV-2 by FDA under an Emergency Use Authorization (EUA). This EUA will remain in effect (meaning this test can be used) for the duration of the COVID-19 declaration under Section 564(b)(1) of the Act, 21 U.S.C. section 360bbb-3(b)(1), unless the authorization is terminated or revoked.  Performed at [facility], [address] [place],  [address]   MRSA Next Gen by PCR, Nasal     Status: None   Collection Time: 12/10/2022  8:43 PM   Specimen: Nasal  Mucosa; Nasal Swab  Result Value Ref Range Status   MRSA by PCR Next Gen NOT DETECTED NOT DETECTED Final    Comment: (NOTE) The GeneXpert MRSA Assay (FDA approved for NASAL specimens only), is one component of a comprehensive MRSA colonization surveillance program. It is not intended to diagnose MRSA infection nor to guide or monitor treatment for MRSA infections. Test performance is not FDA approved in patients less than 33 years old. Performed at [facility], [address] [place],  [place] [address]     Coagulation Studies: No results for input(s): "LABPROT", "INR" in the last 72 hours.  Urinalysis: No results for input(s): "COLORURINE", "LABSPEC", "PHURINE", "GLUCOSEU", "HGBUR", "BILIRUBINUR", "KETONESUR", "PROTEINUR", "UROBILINOGEN", "NITRITE", "LEUKOCYTESUR" in the last 72 hours.  Invalid input(s): "APPERANCEUR"    Imaging: US RENAL  Result Date: 12/25/2022 CLINICAL DATA:  Acute renal failure. EXAM: RENAL / URINARY TRACT ULTRASOUND COMPLETE COMPARISON:  None Available. FINDINGS: Right Kidney: Renal measurements: 12.4 cm x 5.9 cm x 5.1 cm = volume: 194.4 mL. Echogenicity within normal limits. No mass or hydronephrosis visualized. Left Kidney: Renal measurements: 13.2 cm x 5.9 cm x 5.1 cm = volume: 208 mL. Echogenicity within normal limits. No mass or hydronephrosis visualized. Bladder: The urinary bladder is empty and subsequently limited in evaluation. Other: None. IMPRESSION: Unremarkable renal ultrasound. Electronically Signed   By: Virgina Norfolk M.D.   On: 12/25/2022 18:29   DG Abd 1 View  Result Date: 12/25/2022 CLINICAL DATA:  Abdominal distension EXAM: ABDOMEN - 1 VIEW COMPARISON:  12/25/2022 FINDINGS: Enteric tube terminates within the proximal stomach. Gaseous distension of the colon. No abnormally dilated loops of small bowel are seen. No gross free intraperitoneal air. Cholecystectomy clips. IMPRESSION: Gaseous distension of the colon. Enteric tube  terminates within the proximal stomach. Electronically Signed   By: Davina Poke D.O.   On: 12/25/2022 11:24     Medications:    sodium chloride     dexmedetomidine (PRECEDEX) IV infusion 0.4 mcg/kg/hr (12/27/22 1042)   famotidine (PEPCID) IV Stopped (12/27/22 1497)   fentaNYL infusion INTRAVENOUS Stopped (12/27/22 0901)   propofol (DIPRIVAN) infusion Stopped (12/27/22 0901)    budesonide (PULMICORT) nebulizer solution  0.5 mg Nebulization BID   Chlorhexidine Gluconate Cloth  6 each Topical Daily   docusate  100 mg Per Tube BID   feeding supplement (PROSource TF20)  60 mL Per Tube TID   feeding supplement (VITAL HIGH PROTEIN)  1,000 mL Per Tube W26V   folic acid  1 mg Per Tube Daily   free water  30 mL Per Tube Q4H   heparin injection (subcutaneous)  5,000 Units Subcutaneous Q8H   insulin aspart  0-20 Units Subcutaneous Q4H   ipratropium-albuterol  3 mL Nebulization Q4H   multivitamin with minerals  1 tablet Per Tube Daily   mouth rinse  15 mL Mouth Rinse Q2H   polyethylene glycol  17 g Per Tube Daily   propranolol  40 mg Per Tube BID   rosuvastatin  5 mg Per Tube Daily   thiamine (VITAMIN B1) injection  100 mg Intravenous Daily   albuterol, dextromethorphan-guaiFENesin, fentaNYL, ondansetron (ZOFRAN) IV, mouth rinse  Assessment/ Plan:  [name] is a 82 y.o.  male with a PMHx of advanced COPD, asthma, diabetes mellitus type 2, GERD, hyperlipidemia, hypertension, obstructive sleep apnea, anxiety, who was admitted to [facility] on 12/09/2022 for evaluation of severe tremors.  Patient has history of alcohol intake nightly.  He developed significant delirium tremens upon admission and subsequently intubated for airway protection.    Acute kidney injury secondary to acute tubular necrosis from hypotension. Patient was on pressors earlier in the course of admission but now weaned off. Renal ultrasound negative for obstruction.    Creatinine continues to slowly improve. Urine  output gradually increasing also. Blood pressure improved. No acute need for dialysis. Avoid nephrotoxic agents and therapies, including hypotension.   Lab Results  Component Value Date   CREATININE 2.42 (H) 12/27/2022   CREATININE 3.15 (H) 12/26/2022  CREATININE 3.29 (H) 12/25/2022    Intake/Output Summary (Last 24 hours) at 12/27/2022 1112 Last data filed at 12/27/2022 1042 Gross per 24 hour  Intake 975.23 ml  Output 635 ml  Net 340.23 ml     2.  Acute respiratory failure.  Weaning trial failed yesterday due to agitation. Remains intubated on vent at 28% FiO2.   3.  Delirium tremors.  Transitioned to precedex per primary team.      LOS: 5 [place] 1/2/202411:12 AM

## 2022-12-27 NOTE — Progress Notes (Signed)
   12/27/22 1400  Clinical Encounter Type  Visited With Patient and family together  Visit Type Initial  Referral From Nurse  Consult/Referral To Chaplain   Chaplain visited with patient and wife. Chaplain provided compassionate presence and reflective listening as wife shared about patient's health challenges. Chaplain provided support for wife. Chaplain services are available for follow up as needed.

## 2022-12-27 NOTE — Progress Notes (Signed)
Spoke with Dr. Gwendalyn Ege regarding patients blood pressure 190's and respirations 30's. Patient still a rass of -4. Orders to restart precedex.

## 2022-12-27 NOTE — Progress Notes (Signed)
Patient grimaces to pain. No purposeful movement noted. Precedex paused for 10 minutes. Patient still only grimaces to pain however patient's respiratory rate increased to 36 and becomes asynchrony on the venti. Precedex is back infusing at 0.61mq.

## 2022-12-27 NOTE — Progress Notes (Addendum)
Nutrition Follow-up  DOCUMENTATION CODES:   Obesity unspecified  INTERVENTION:   If pt does not extubate, recommend:  Vital 1.2_0 /hr- Initiate at 63m/hr, once tolerating, advance by 152mhr q 8 hours until goal rate is reached.   ProSource TF 20- Give 6022maily via tube, each supplement provides 80kcal and 20g of protein.   Regimen provides 1808kcal/day, 128g/day protein and 1348m33my of free water.   Pt at high refeed risk; recommend monitor potassium, magnesium and phosphorus labs daily until stable  Daily weights  NUTRITION DIAGNOSIS:   Inadequate oral intake related to inability to eat as evidenced by NPO status.  GOAL:   Provide needs based on ASPEN/SCCM guidelines -not met   MONITOR:   Vent status, Labs, Weight trends, Skin, TF tolerance, I & O's  ASSESSMENT:   81 y73 male with h/o seizures, HTN, COPD, HLD, DM, etoh abuse, PAF, depression, anxiety, dementia, OSA and GERD who is admitted with ETOH withdrawal and possible seizures.  Pt remains sedated and ventilated. Plan is for [facility] [place]. Pt noted to have abdominal distension and tube feed colored secretions on 12/31. Tube feeds have been off since 12/31. KUB with NSF. Will plan to restart tube feeds if pt does not extubate. Nephrology following for AKI. Per chart, pt is up ~16lbs since admission; pt +4.3L on his I & Os.   Medications reviewed and include: colace, folic acid, heparin, insulin, MVI, miralax, thiamine, pepcid  Labs reviewed: K 4.5 wnl, BUN 57(H), creat 2.42(H), P 4.7(H) Hgb 10.5(L), Hct 33.2(L) Cbgs- 87, 90, 100, 95 x 24 hrs AIC 7.4(H)- 11/26  Patient is currently intubated on ventilator support MV: 6.6 L/min Temp (24hrs), Avg:99.8 F (37.7 C), Min:98.4 F (36.9 C), Max:101.1 F (38.4 C)  Propofol: NONE   MAP- >65mm88m UOP- 785ml 68met Order:   Diet Order     None      EDUCATION NEEDS:   Not appropriate for education at this time  Skin:  Skin Assessment: Reviewed RN  Assessment (ecchymosis)  Last BM:  12/30- type 6  Height:   Ht Readings from Last 1 Encounters:  12/15/2022 _1  (1.626 m)    Weight:   Wt Readings from Last 1 Encounters:  12/27/22 85.3 kg    Ideal Body Weight:  59.1 kg  BMI:  Body mass index is 32.28 kg/m.  Estimated Nutritional Needs:   Kcal:  1825kcal/day  Protein:  > 118 grams  Fluid:  1.5-1.8L/day  Minta Fair Koleen DistanceD, LDN Please refer to [facility] and/or RD on-call/weekend/after hours pager

## 2022-12-27 NOTE — Progress Notes (Signed)
Brett Blake for Electrolyte Monitoring and Replacement   Recent Labs: Potassium (mmol/L)  Date Value  12/27/2022 4.5   Magnesium (mg/dL)  Date Value  12/25/2022 2.2   Calcium (mg/dL)  Date Value  12/27/2022 8.5 (L)   Albumin (g/dL)  Date Value  12/27/2022 2.2 (L)   Phosphorus (mg/dL)  Date Value  12/27/2022 4.7 (H)   Sodium (mmol/L)  Date Value  12/27/2022 139  Corrected Ca: 9.9 mg/dL  Assessment  Brett Blake is a 82 y.o. male presenting with seizure. PMH significant for AUD, HTN, HLD, DM, COPD, dementia, depression with anxiety, pseudoseizure, former smoker. Pharmacy has been consulted to monitor and replace electrolytes.  Diet: TF @ 30 mL/hr  Goal of Therapy: Electrolytes WNL  Plan:  Potassium: 4.6 >> 4.5, no replacement needed Phosphorus: 5.9 >> 4.7, improving, continue to monitor Check renal function panel with AM labs  Thank you for allowing pharmacy to be a part of this patient's care.  Gretel Acre, PharmD PGY1 Pharmacy Resident 12/27/2022 12:21 PM

## 2022-12-27 NOTE — Progress Notes (Signed)
NAME:  Brett Blake, MRN:  [id number], DOB:  1941/04/12, LOS: 5 ADMISSION DATE:  12/18/2022, CONSULTATION DATE: 12/25/2022 REFERRING MD: Dr. Blaine Hamper, CHIEF COMPLAINT: AMS   History of Present Illness:  This is an 82 yo male with hx of ETOH Abuse who presented to [facility] ER on 12/28 via EMS from home following a possible seizure activity the morning of 12/28.  He has a hx of non epileptic seizures, however he does not take antiepileptics.  He reported he has not had a seizure in a long time.  All hx obtained from pts chart and daughter.   ED Course Upon arrival to the ER pt noted to have severe tremors.  Pt reported he drinks 1 small alcoholic beverage every night prior to going to bed.  However, pts wife reported he drank hard liquor heavily over the past several days and abruptly stopped drinking 2 days ago.  He also complained of an upset stomach.  ER lab results revealed K+ 3.1/glucose 138/calcium 8.3/albumin 3.4/AST 81/ALT 62/total protein 6.4/magnesium 1.6/wbc 3.9/hgb 12.1/platelets 125.  COVID-19/Influenza A&B negative.  Pt had a CIWA score of 4, he received 1 mg of iv ativan.  Hospitalist initially contacted for hospital admission.  However, pt became increasingly agitated and combative despite receiving a total of 3 mg of iv ativan and 3 mg of IM haldol.  Security called to bedside.  PCCM team consulted due to pt requiring precedex gtt.  However, despite precedex gtt at maximal dose; additional 2 mg of iv ativan, 12.5 mg of iv fentanyl, and 2.5 mg of iv haldol pt remained combative/agitated requiring mechanical intubation.  Pt admitted to ICU per PCCM team for additional workup and treatment.    CT Head 12/28:  No evidence of acute intracranial abnormality. Mild chronic small vessel ischemic disease. CXR 12/28: No active cardiopulmonary disease.   Please see "significant hospital events" section below for full detailed hospital course.  Pertinent  Medical History  Anxiety  Arthritis  Asthma   Colon Polyps  COPD Depression  Type II Diabetes Mellitus Difficult Intubation  GERD  Hypercholesteremia  HLD HTN  Hyponatremia  OSA   Micro Data:  12/28: SARS-CoV-2 and influenza PCR>> negative 12/28: MRSA PCR>> negative  Antimicrobials:   Anti-infectives (From admission, onward)    None       Significant Hospital Events: Including procedures, antibiotic start and stop dates in addition to other pertinent events   12/28: Pt admitted with possible seizure activity suspect secondary to severe ETOH withdrawal requiring mechanical intubation  12/23/22- patient still having seizures despite propofol, the seizures do respond to Ativan.  Pharmacy is monitoring electrolytes.  He is weaning off levophed.  He will start feeds today.  ABG with res alkalosis and RR was reduced.  12/24/22- patient improved, dcd phenobarb and wake up assessment was very poor today with onset of tachyarrythmia in 140s and seizure like activity upon reduction of sedation.  12/25/22-  patient is critically ill, he is not tolerating reduction of sedation with emergence of seizure like activity and tachyarrythmia. Wife and nephew at bedside we discussed short term plan. Today we have transitioned to herparin from lovenox due to worsening renal impairment.  12/26/22- patient was not able to tolerate reduction in sedation with devt of severe hypertension and tachyarrythmia 12/27/22:  AKI slowly improving.  Start Precedex to assist with WUA.  Interim History / Subjective:  -No significant events noted overnight -Low-grade temperature overnight, T max 101.1 (via Foley core probe), fever has resolved  this morning  ~remains without leukocytosis ~continue to monitor fever curve -Hemodynamically stable, no vasopressors -Has failed multiple wake up attempts due to tachycardia, hypertension, and agitation ~will start Precedex to assist with wake up assessment -Creatinine improved to 2.42 from 3.15, urine output 785 cc over  the past 24 hours (net + 4.3 L)  Objective   Blood pressure 124/72, pulse (!) 101, temperature 99.5 F (37.5 C), resp. rate 11, height _0  (1.626 m), weight 85.3 kg, SpO2 99 %.    Vent Mode: PRVC FiO2 (%):  [28 %] 28 % Set Rate:  [16 bmp] 16 bmp Vt Set:  [400 mL] 400 mL PEEP:  [5 cmH20] 5 cmH20 Plateau Pressure:  [16 cmH20-23 cmH20] 16 cmH20   Intake/Output Summary (Last 24 hours) at 12/27/2022 0102 Last data filed at 12/27/2022 0600 Gross per 24 hour  Intake 1355.24 ml  Output 785 ml  Net 570.24 ml   Filed Weights   11/30/2022 1111 12/18/2022 2030 12/27/22 0500  Weight: 78 kg 80 kg 85.3 kg    Examination: General: Acutely on chronicallyy-ill appearing male, NAD mechanically intubated HENT: Supple, difficult to assess for JVD due to body habitus  Lungs: Clear diminished throughout, no wheezing or rhonchi noted, even, non labored, synchronous with the vent Cardiovascular: Regular rate and rhythm (NSR on telemetry), s1s2, no r/g, 2+ radial/1+ distal, no edema  Abdomen: +BS x4, obese, soft, non distended  Extremities: Normal bulk and tone, no deformities, no edema moves all extremities  Neuro: Sedated, not following commands, pupils PERRL  GU: Indwelling foley catheter draining yellow urine  Resolved Hospital Problem list     Assessment & Plan:   Mechanical intubation for airway protection in the setting of ETOH withdrawal  Hx: OSA, COPD, Asthma, and Difficult Intubation  -Full vent support, implement lung protective strategies -Plateau pressures less than 30 cm H20 -Wean FiO2 & PEEP as tolerated to maintain O2 sats 88 to 92% -Follow intermittent Chest X-ray & ABG as needed -Spontaneous Breathing Trials when respiratory parameters met and mental status permits -Implement VAP Bundle -Bronchodilators & Pulmicort nebs  Hypertension Tachycardia, due to DT's Hx: Hypercholesteremia  -Continuous cardiac monitoring -Maintain MAP >65 -Continue Propranolol -Continue outpatient  rosuvastatin   Acute Kidney Injury ~ IMPROVING -Monitor I&O's / urinary output -Follow BMP -Ensure adequate renal perfusion -Avoid nephrotoxic agents as able -Replace electrolytes as indicated -Renal US negative -Nephrology following, appreciate input ~ no acute need for dialysis  Type II diabetes mellitus  -CBG's q4h; Target range of 140 to 180 -SSI -Follow ICU Hypo/Hyperglycemia protocol  Possible seizure activity in the setting of ETOH withdrawal  Delirium Tremens Sedation needs in setting of mechanical ventilation -CT Head on admission negative for acute intracranial abnormality -EEG 12/23/22 suggestive of severe to profound diffuse encephalopathy, nonspecific etiology but likely related to sedation. No seizures or epileptiform discharges were seen throughout the recording.  -Maintain a RASS goal of 0 to -1 -Fentanyl and Propofol as needed to maintain RASS goal -Avoid sedating medications as able -Daily wake up assessment ~ will start Precedex 12/27/22 -Seizure precautions  -CIWA protocol -Thiamine, folic acid, MVI     Best Practice (right click and "Reselect all SmartList Selections" daily)   Diet/type: NPO, tube feeds DVT prophylaxis: Heparin SQ GI prophylaxis: H2B Lines: N/A Foley:  Yes, and it is still needed Code Status:  full code Last date of multidisciplinary goals of care discussion [12/27/22]  1/2: Updated pt's spouse at bedside.  Labs   CBC: Recent Labs  Lab 12/23/22 0603 12/24/22 0406 12/25/22 0410 12/26/22 0440 12/27/22 0416  WBC 5.3 8.2 7.1 6.3 5.3  HGB 10.3* 10.3* 9.2* 11.4* 10.5*  HCT 31.0* 32.1* 29.0* 35.2* 33.2*  MCV 98.4 101.9* 103.6* 103.2* 104.1*  PLT 100* 133* 129* 118* 139*     Basic Metabolic Panel: Recent Labs  Lab 12/20/2022 1149 12/15/2022 1150 12/23/22 0017 12/23/22 0603 12/24/22 0406 12/25/22 0410 12/25/22 2159 12/26/22 0440 12/27/22 0416  NA  --    < >  --  138 138 138 135 137 139  K  --    < > 2.9* 4.1 4.4 4.8 4.9  4.6 4.5  CL  --    < >  --  105 104 106 104 104 104  CO2  --    < >  --  _0 20* 21* 24  GLUCOSE  --    < >  --  178* 181* 139* 158* 119* 97  BUN  --    < >  --  11 18 33* 42* 46* 57*  CREATININE  --    < >  --  0.96 1.95* 3.09* 3.29* 3.15* 2.42*  CALCIUM  --    < >  --  7.5* 8.2* 8.0* 8.1* 8.6* 8.5*  MG 1.6*  --  2.4 2.4 2.4  --  2.2  --   --   PHOS  --    < > 4.1 3.6 4.9* 5.7*  --  5.9* 4.7*   < > = values in this interval not displayed.    GFR: Estimated Creatinine Clearance: 24 mL/min (A) (by C-G formula based on SCr of 2.42 mg/dL (H)). Recent Labs  Lab 12/24/22 0406 12/25/22 0410 12/26/22 0440 12/27/22 0416  WBC 8.2 7.1 6.3 5.3     Liver Function Tests: Recent Labs  Lab 12/06/2022 1150 12/24/22 0406 12/25/22 0410 12/26/22 0440 12/27/22 0416  AST 81*  --   --   --   --   ALT 62*  --   --   --   --   ALKPHOS 85  --   --   --   --   BILITOT 1.2  --   --   --   --   PROT 6.4*  --   --   --   --   ALBUMIN 3.4* 2.7* 2.4* 2.6* 2.2*    No results for input(s): "LIPASE", "AMYLASE" in the last 168 hours. No results for input(s): "AMMONIA" in the last 168 hours.  ABG    Component Value Date/Time   PHART 7.41 12/23/2022 0423   PCO2ART 46 12/23/2022 0423   PO2ART 81 (L) 12/23/2022 0423   HCO3 29.1 (H) 12/23/2022 0423   O2SAT 98.5 12/23/2022 0423     Coagulation Profile: No results for input(s): "INR", "PROTIME" in the last 168 hours.  Cardiac Enzymes: Recent Labs  Lab 12/25/22 0409  CKTOTAL 81    HbA1C: Hgb A1c MFr Bld  Date/Time Value Ref Range Status  11/20/2022 05:04 AM 7.4 (H) 4.8 - 5.6 % Final    Comment:    (NOTE)         Prediabetes: 5.7 - 6.4         Diabetes: >6.4         Glycemic control for adults with diabetes: <7.0   02/15/2022 05:57 AM 5.9 (H) 4.8 - 5.6 % Final    Comment:    (NOTE)         Prediabetes: 5.7 -  6.4         Diabetes: >6.4         Glycemic control for adults with diabetes: <7.0     CBG: Recent Labs  Lab  12/26/22 1918 12/26/22 2331 12/27/22 0358 12/27/22 0402 12/27/22 0757  GLUCAP 132* 121* 95 100* 90    Review of Systems:   Unable to assess pt mechanically intubated   Past Medical History:  He,  has a past medical history of Anxiety, Arthritis, Asthma, Colon polyps, COPD (chronic obstructive pulmonary disease) ([place]), Depression, Diabetes ([place]), Difficult intubation, GERD (gastroesophageal reflux disease), High cholesterol, Hyperlipidemia, Hypertension, Hyponatremia (02/23/2022), and Sleep apnea.   Surgical History:   Past Surgical History:  Procedure Laterality Date   BRONCHOSCOPY     CARDIAC CATHETERIZATION Left 11/04/2016   Procedure: Left Heart Cath and Coronary Angiography;  Surgeon: Teodoro Spray, MD;  Location: [place] [facility];  Service: Cardiovascular;  Laterality: Left;   CARDIAC CATHETERIZATION     CHOLECYSTECTOMY N/A 05/09/2019   Procedure: LAPAROSCOPIC CHOLECYSTECTOMY WITH INTRAOPERATIVE CHOLANGIOGRAM;  Surgeon: Jules Husbands, MD;  Location: [facility];  Service: General;  Laterality: N/A;   COLOSTOMY     IR KYPHO LUMBAR INC FX REDUCE BONE BX UNI/BIL CANNULATION INC/IMAGING  11/10/2022   IR RADIOLOGIST EVAL & MGMT  11/08/2022   LEFT HEART CATH AND CORONARY ANGIOGRAPHY N/A 12/14/2021   Procedure: LEFT HEART CATH AND CORONARY ANGIOGRAPHY;  Surgeon: Corey Skains, MD;  Location: [place] [facility];  Service: Cardiovascular;  Laterality: N/A;   ROTATOR CUFF REPAIR Left    THUMB ARTHROSCOPY  2015   TONSILLECTOMY  1952   TONSILLECTOMY       Social History:   reports that he has quit smoking. His smoking use included cigarettes. He has a 15.00 pack-year smoking history. He has never used smokeless tobacco. He reports current alcohol use of about 1.0 - 2.0 standard drink of alcohol per week. He reports that he does not use drugs.   Family History:  His family history includes Heart attack in his mother; Leukemia in his brother; Prostate cancer in his brother and  father.   Allergies Allergies  Allergen Reactions   Ace Inhibitors Cough   Metoprolol Diarrhea    Patient has been tolerating Propranolol 40 mg twice daily since at least 05/2022.   Nsaids Rash   Sulfa Antibiotics Rash     Home Medications  Prior to Admission medications   Medication Sig Start Date End Date Taking? Authorizing Provider  albuterol (ACCUNEB) 1.25 MG/3ML nebulizer solution Take 3 mLs by nebulization every 6 (six) hours as needed for wheezing. 11/20/22  Yes Wieting, Richard, MD  ALPRAZolam Duanne Moron) 0.5 MG tablet Take 0.5 mg by mouth daily as needed for anxiety. 10/06/22 10/06/23 Yes [provider]  aspirin EC 81 MG tablet Take 81 mg by mouth daily. Swallow whole.   Yes [provider]  diltiazem (CARDIZEM CD) 240 MG 24 hr capsule Take 240 mg by mouth every morning. 12/07/21  Yes [provider]  donepezil (ARICEPT) 5 MG tablet Take 5 mg by mouth at bedtime. 05/10/22  Yes [provider]  fluticasone (FLONASE) 50 MCG/ACT nasal spray Place 1 spray into both nostrils daily as needed for allergies or rhinitis.   Yes [provider]  folic acid (FOLVITE) 1 MG tablet Take 1 tablet (1 mg total) by mouth daily. 03/01/22  Yes Wieting, Richard, MD  gabapentin (NEURONTIN) 300 MG capsule Take 1 capsule (300 mg total) by  mouth at bedtime. 02/28/22  Yes Wieting, Richard, MD  melatonin 5 MG TABS Take 1 tablet (5 mg total) by mouth at bedtime. 02/28/22  Yes Wieting, Richard, MD  nitroGLYCERIN (NITROSTAT) 0.4 MG SL tablet Place 0.4 mg under the tongue every 5 (five) minutes as needed for chest pain.   Yes [provider]  omeprazole (PRILOSEC) 40 MG capsule Take 40 mg by mouth daily. 04/20/22  Yes [provider]  ondansetron (ZOFRAN-ODT) 4 MG disintegrating tablet Take 1 tablet (4 mg total) by mouth every 8 (eight) hours as needed for nausea or vomiting. 06/11/22  Yes Carrie Mew, MD  propranolol (INDERAL) 40 MG tablet Take 40 mg by  mouth 2 (two) times daily. 11/07/22  Yes [provider]  QUEtiapine (SEROQUEL) 25 MG tablet One tab po qhs (can take extra dose if not sleeping) 02/28/22  Yes Wieting, Richard, MD  rosuvastatin (CRESTOR) 5 MG tablet Take 5 mg by mouth daily. 05/10/22  Yes [provider]  SYMBICORT 80-4.5 MCG/ACT inhaler Inhale 2 puffs into the lungs 2 (two) times daily as needed.  05/19/20  Yes [provider]  venlafaxine XR (EFFEXOR-XR) 37.5 MG 24 hr capsule Take 37.5 mg by mouth daily. 12/07/21  Yes [provider]  azithromycin (ZITHROMAX) 250 MG tablet One tab po daily for three days Patient not taking: Reported on 12/01/2022 11/21/22   Loletha Grayer, MD  guaiFENesin (ROBITUSSIN) 100 MG/5ML liquid Take 5 mLs by mouth every 4 (four) hours as needed for cough or to loosen phlegm. Patient not taking: Reported on 12/08/2022 02/28/22   Loletha Grayer, MD  polyethylene glycol (MIRALAX / GLYCOLAX) 17 g packet Take 17 g by mouth daily as needed for moderate constipation. Patient not taking: Reported on 11/08/2022 02/28/22   Loletha Grayer, MD     Critical care time: 40 minutes    Darel Hong, AGACNP-BC [facility] Prefer epic messenger for cross cover needs If after hours, please call E-link

## 2022-12-28 ENCOUNTER — Inpatient Hospital Stay: Payer: Medicare Other

## 2022-12-28 DIAGNOSIS — R569 Unspecified convulsions: Secondary | ICD-10-CM | POA: Diagnosis not present

## 2022-12-28 LAB — RENAL FUNCTION PANEL
Albumin: 2.1 g/dL — ABNORMAL LOW (ref 3.5–5.0)
Anion gap: 14 (ref 5–15)
BUN: 64 mg/dL — ABNORMAL HIGH (ref 8–23)
CO2: 18 mmol/L — ABNORMAL LOW (ref 22–32)
Calcium: 8.6 mg/dL — ABNORMAL LOW (ref 8.9–10.3)
Chloride: 107 mmol/L (ref 98–111)
Creatinine, Ser: 2.07 mg/dL — ABNORMAL HIGH (ref 0.61–1.24)
GFR, Estimated: 32 mL/min — ABNORMAL LOW (ref >60)
Glucose, Bld: 150 mg/dL — ABNORMAL HIGH (ref 70–99)
Phosphorus: 4.5 mg/dL (ref 2.5–4.6)
Potassium: 4.7 mmol/L (ref 3.5–5.1)
Sodium: 139 mmol/L (ref 135–145)

## 2022-12-28 LAB — GLUCOSE, CAPILLARY
Glucose-Capillary: 128 mg/dL — ABNORMAL HIGH (ref 70–99)
Glucose-Capillary: 146 mg/dL — ABNORMAL HIGH (ref 70–99)
Glucose-Capillary: 157 mg/dL — ABNORMAL HIGH (ref 70–99)
Glucose-Capillary: 161 mg/dL — ABNORMAL HIGH (ref 70–99)
Glucose-Capillary: 169 mg/dL — ABNORMAL HIGH (ref 70–99)
Glucose-Capillary: 229 mg/dL — ABNORMAL HIGH (ref 70–99)

## 2022-12-28 LAB — CBC
HCT: 30.9 % — ABNORMAL LOW (ref 39.0–52.0)
Hemoglobin: 9.8 g/dL — ABNORMAL LOW (ref 13.0–17.0)
MCH: 32.6 pg (ref 26.0–34.0)
MCHC: 31.7 g/dL (ref 30.0–36.0)
MCV: 102.7 fL — ABNORMAL HIGH (ref 80.0–100.0)
Platelets: 152 10*3/uL (ref 150–400)
RBC: 3.01 MIL/uL — ABNORMAL LOW (ref 4.22–5.81)
RDW: 16.8 % — ABNORMAL HIGH (ref 11.5–15.5)
WBC: 4.7 10*3/uL (ref 4.0–10.5)
nRBC: 3.4 % — ABNORMAL HIGH (ref 0.0–0.2)

## 2022-12-28 LAB — MAGNESIUM: Magnesium: 2.6 mg/dL — ABNORMAL HIGH (ref 1.7–2.4)

## 2022-12-28 LAB — PATHOLOGIST SMEAR REVIEW

## 2022-12-28 MED ORDER — VITAL AF 1.2 CAL PO LIQD
1000.0000 mL | ORAL | Status: DC
  Administered 2022-12-28: 1000 mL

## 2022-12-28 MED ORDER — VENLAFAXINE HCL 37.5 MG PO TABS
18.7500 mg | ORAL_TABLET | Freq: Two times a day (BID) | ORAL | Status: DC
  Administered 2022-12-28 – 2023-01-08 (×23): 18.75 mg
  Filled 2022-12-28 (×25): qty 0.5

## 2022-12-28 MED ORDER — QUETIAPINE FUMARATE 25 MG PO TABS
25.0000 mg | ORAL_TABLET | Freq: Every day | ORAL | Status: DC
  Administered 2022-12-28 – 2022-12-29 (×2): 25 mg
  Filled 2022-12-28 (×2): qty 1

## 2022-12-28 MED ORDER — FENTANYL CITRATE PF 50 MCG/ML IJ SOSY
25.0000 ug | PREFILLED_SYRINGE | INTRAMUSCULAR | Status: DC | PRN
  Administered 2022-12-28 – 2023-01-08 (×10): 25 ug via INTRAVENOUS
  Filled 2022-12-28 (×11): qty 1

## 2022-12-28 NOTE — Progress Notes (Signed)
[facility]  ROUNDING NOTE   Subjective:   Patient seen and evaluated at bedside in ICU Sedation weaned this morning Vent 28% FiO2 Trace lower extremity edema Tube feed at 20 mL/h  Creatinine 2.07  UOP 951m  Objective:  Vital signs in last 24 hours:  Temp:  [95.7 F (35.4 C)-100 F (37.8 C)] 95.9 F (35.5 C) (01/03 1053) Pulse Rate:  [79-100] 86 (01/03 1100) Resp:  [16-32] 32 (01/03 1100) BP: (126-183)/(68-104) 183/100 (01/03 1100) SpO2:  [95 %-100 %] 99 % (01/03 1100) FiO2 (%):  [28 %] 28 % (01/03 0743) Weight:  [84 kg] 84 kg (01/03 0500)  Weight change: -1.3 kg Filed Weights   11/30/2022 2030 12/27/22 0500 12/28/22 0500  Weight: 80 kg 85.3 kg 84 kg    Intake/Output: I/O last 3 completed shifts: In: 1320.8 [I.V.:523.4; NG/GT:754.5; IV Piggyback:43] Out: 13762[Urine:1265]   Intake/Output this shift:  Total I/O In: 737.7 [I.V.:20.1; NG/GT:667.7; IV Piggyback:50] Out: 300 [Urine:300] Follow-up no Physical Exam: General: Ill appearing  Head: Normocephalic, atraumatic.   Eyes: Anicteric  Lungs:  Scattered rhonchi, vent assisted  Heart: S1S2 present, tachycardic  Abdomen:  Soft, nontender  Extremities: Trace peripheral edema.  Neurologic: Sedation weaned  Skin: No lesions  Access: None    Basic Metabolic Panel: Recent Labs  Lab 12/23/22 0017 12/23/22 0603 12/24/22 0406 12/25/22 0410 12/25/22 2159 12/26/22 0440 12/27/22 0416 12/28/22 0322  NA  --  138 138 138 135 137 139 139  K 2.9* 4.1 4.4 4.8 4.9 4.6 4.5 4.7  CL  --  105 104 106 104 104 104 107  CO2  --  '24 23 23 '$ 20* 21* 24 18*  GLUCOSE  --  178* 181* 139* 158* 119* 97 150*  BUN  --  11 18 33* 42* 46* 57* 64*  CREATININE  --  0.96 1.95* 3.09* 3.29* 3.15* 2.42* 2.07*  CALCIUM  --  7.5* 8.2* 8.0* 8.1* 8.6* 8.5* 8.6*  MG 2.4 2.4 2.4  --  2.2  --   --  2.6*  PHOS 4.1 3.6 4.9* 5.7*  --  5.9* 4.7* 4.5     Liver Function Tests: Recent Labs  Lab 12/17/2022 1150 12/24/22 0406  12/25/22 0410 12/26/22 0440 12/27/22 0416 12/28/22 0322  AST 81*  --   --   --   --   --   ALT 62*  --   --   --   --   --   ALKPHOS 85  --   --   --   --   --   BILITOT 1.2  --   --   --   --   --   PROT 6.4*  --   --   --   --   --   ALBUMIN 3.4* 2.7* 2.4* 2.6* 2.2* 2.1*    No results for input(s): "LIPASE", "AMYLASE" in the last 168 hours. No results for input(s): "AMMONIA" in the last 168 hours.  CBC: Recent Labs  Lab 12/24/22 0406 12/25/22 0410 12/26/22 0440 12/27/22 0416 12/28/22 0322  WBC 8.2 7.1 6.3 5.3 4.7  HGB 10.3* 9.2* 11.4* 10.5* 9.8*  HCT 32.1* 29.0* 35.2* 33.2* 30.9*  MCV 101.9* 103.6* 103.2* 104.1* 102.7*  PLT 133* 129* 118* 139* 152     Cardiac Enzymes: Recent Labs  Lab 12/25/22 0409  CKTOTAL 81     BNP: Invalid input(s): "POCBNP"  CBG: Recent Labs  Lab 12/27/22 1548 12/27/22 1935 12/27/22 2304 12/28/22 0324 12/28/22 0747  GLUCAP McCreary     Microbiology: Results for orders placed or performed during the hospital encounter of 12/03/2022  Resp Panel by RT-PCR (Flu A&B, Covid) Anterior Nasal Swab     Status: None   Collection Time: 12/08/2022  1:14 PM   Specimen: Anterior Nasal Swab  Result Value Ref Range Status   SARS Coronavirus 2 by RT PCR NEGATIVE NEGATIVE Final    Comment: (NOTE) SARS-CoV-2 target nucleic acids are NOT DETECTED.  The SARS-CoV-2 RNA is generally detectable in upper respiratory specimens during the acute phase of infection. The lowest concentration of SARS-CoV-2 viral copies this assay can detect is 138 copies/mL. A negative result does not preclude SARS-Cov-2 infection and should not be used as the sole basis for treatment or other patient management decisions. A negative result may occur with  improper specimen collection/handling, submission of specimen other than nasopharyngeal swab, presence of viral mutation(s) within the areas targeted by this assay, and inadequate number of  viral copies(<138 copies/mL). A negative result must be combined with clinical observations, patient history, and epidemiological information. The expected result is Negative.  Fact Sheet for Patients:  EntrepreneurPulse.com.au  Fact Sheet for Healthcare Providers:  IncredibleEmployment.be  This test is no t yet approved or cleared by the [place] FDA and  has been authorized for detection and/or diagnosis of SARS-CoV-2 by FDA under an Emergency Use Authorization (EUA). This EUA will remain  in effect (meaning this test can be used) for the duration of the COVID-19 declaration under Section 564(b)(1) of the Act, 21 U.S.C.section 360bbb-3(b)(1), unless the authorization is terminated  or revoked sooner.       Influenza A by PCR NEGATIVE NEGATIVE Final   Influenza B by PCR NEGATIVE NEGATIVE Final    Comment: (NOTE) The Xpert Xpress SARS-CoV-2/FLU/RSV plus assay is intended as an aid in the diagnosis of influenza from Nasopharyngeal swab specimens and should not be used as a sole basis for treatment. Nasal washings and aspirates are unacceptable for Xpert Xpress SARS-CoV-2/FLU/RSV testing.  Fact Sheet for Patients: EntrepreneurPulse.com.au  Fact Sheet for Healthcare Providers: IncredibleEmployment.be  This test is not yet approved or cleared by the [place] FDA and has been authorized for detection and/or diagnosis of SARS-CoV-2 by FDA under an Emergency Use Authorization (EUA). This EUA will remain in effect (meaning this test can be used) for the duration of the COVID-19 declaration under Section 564(b)(1) of the Act, 21 U.S.C. section 360bbb-3(b)(1), unless the authorization is terminated or revoked.  Performed at [facility], [place] [place], [place] [address]   MRSA Next Gen by PCR, Nasal     Status: None   Collection Time: 12/23/2022  8:43 PM   Specimen: Nasal Mucosa;  Nasal Swab  Result Value Ref Range Status   MRSA by PCR Next Gen NOT DETECTED NOT DETECTED Final    Comment: (NOTE) The GeneXpert MRSA Assay (FDA approved for NASAL specimens only), is one component of a comprehensive MRSA colonization surveillance program. It is not intended to diagnose MRSA infection nor to guide or monitor treatment for MRSA infections. Test performance is not FDA approved in patients less than 62 years old. Performed at [facility], [address] [place] [place], [place] [address]     Coagulation Studies: No results for input(s): "LABPROT", "INR" in the last 72 hours.  Urinalysis: No results for input(s): "COLORURINE", "LABSPEC", "PHURINE", "GLUCOSEU", "HGBUR", "BILIRUBINUR", "KETONESUR", "PROTEINUR", "UROBILINOGEN", "NITRITE", "LEUKOCYTESUR" in the last 72 hours.  Invalid input(s): "  APPERANCEUR"    Imaging: DG Chest Port 1 View  Result Date: 12/28/2022 CLINICAL DATA:  Check endotracheal tube placement EXAM: PORTABLE CHEST 1 VIEW COMPARISON:  12/24/2022 FINDINGS: Endotracheal tube and gastric catheter are again seen and stable. Cardiac shadow is within normal limits. The lungs are hypoinflated but clear. No bony abnormality is noted. IMPRESSION: Tubes and lines in stable position. Poor inspiratory effort without focal infiltrate. Electronically Signed   By: Inez Catalina M.D.   On: 12/28/2022 01:11   DG Abd 1 View  Result Date: 12/28/2022 CLINICAL DATA:  Abdominal distension EXAM: ABDOMEN - 1 VIEW COMPARISON:  12/25/2022 FINDINGS: Gastric catheter is again noted in the stomach. Changes of prior vertebral augmentation at L2 are seen. Scattered large and small bowel gas is noted. No obstructive changes are seen. No acute bony abnormality is noted. IMPRESSION: Gastric catheter within the stomach.  No acute abnormality noted. Electronically Signed   By: Inez Catalina M.D.   On: 12/28/2022 01:10     Medications:    sodium chloride     dexmedetomidine (PRECEDEX) IV  infusion Stopped (12/28/22 3664)   famotidine (PEPCID) IV Stopped (12/28/22 0919)   fentaNYL infusion INTRAVENOUS Stopped (12/27/22 0901)    budesonide (PULMICORT) nebulizer solution  0.5 mg Nebulization BID   Chlorhexidine Gluconate Cloth  6 each Topical Daily   docusate  100 mg Per Tube BID   feeding supplement (PROSource TF20)  60 mL Per Tube Daily   feeding supplement (VITAL AF 1.2 CAL)  1,000 mL Per Tube Q03K   folic acid  1 mg Per Tube Daily   free water  30 mL Per Tube Q4H   heparin injection (subcutaneous)  5,000 Units Subcutaneous Q8H   insulin aspart  0-15 Units Subcutaneous TID WC   ipratropium-albuterol  3 mL Nebulization Q4H   multivitamin with minerals  1 tablet Per Tube Daily   mouth rinse  15 mL Mouth Rinse Q2H   polyethylene glycol  17 g Per Tube Daily   propranolol  40 mg Per Tube BID   QUEtiapine  25 mg Per Tube QHS   rosuvastatin  5 mg Per Tube Daily   thiamine (VITAMIN B1) injection  100 mg Intravenous Daily   venlafaxine  18.75 mg Per Tube BID WC   albuterol, dextromethorphan-guaiFENesin, fentaNYL, ondansetron (ZOFRAN) IV, mouth rinse  Assessment/ Plan:  [name] is a 82 y.o.  male with a PMHx of advanced COPD, asthma, diabetes mellitus type 2, GERD, hyperlipidemia, hypertension, obstructive sleep apnea, anxiety, who was admitted to [facility] on 12/13/2022 for evaluation of severe tremors.  Patient has history of alcohol intake nightly.  He developed significant delirium tremens upon admission and subsequently intubated for airway protection.    Acute kidney injury secondary to acute tubular necrosis from hypotension. Patient was on pressors earlier in the course of admission but now weaned off. Renal ultrasound negative for obstruction.    Creatinine improving with adequate urine output noted.  Pressors weaned and patient maintaining blood pressure.  Currently undergoing sedation weaning.  No acute need for dialysis will continue to monitor.  Lab Results   Component Value Date   CREATININE 2.07 (H) 12/28/2022   CREATININE 2.42 (H) 12/27/2022   CREATININE 3.15 (H) 12/26/2022    Intake/Output Summary (Last 24 hours) at 12/28/2022 1106 Last data filed at 12/28/2022 1054 Gross per 24 hour  Intake 1426.74 ml  Output 1215 ml  Net 211.74 ml     2.  Acute respiratory failure.  Weaning trial failed yesterday due to agitation.  Intubated on vent at 28%.   3.  Delirium tremors.  Precedex stopped this morning.    LOS: 6   1/3/202411:06 AM

## 2022-12-28 NOTE — Progress Notes (Signed)
[place] for Electrolyte Monitoring and Replacement   Recent Labs: Potassium (mmol/L)  Date Value  12/28/2022 4.7   Magnesium (mg/dL)  Date Value  12/28/2022 2.6 (H)   Calcium (mg/dL)  Date Value  12/28/2022 8.6 (L)   Albumin (g/dL)  Date Value  12/28/2022 2.1 (L)   Phosphorus (mg/dL)  Date Value  12/28/2022 4.5   Sodium (mmol/L)  Date Value  12/28/2022 139  Corrected Ca: 9.9 mg/dL  Assessment  Brett Blake is a 82 y.o. male presenting with seizure. PMH significant for AUD, HTN, HLD, DM, COPD, dementia, depression with anxiety, pseudoseizure, former smoker. Pharmacy has been consulted to monitor and replace electrolytes.  Diet: TF @ 30 mL/hr  Goal of Therapy: Electrolytes WNL  Plan:  Potassium: 4.5 >> 4.7, no replacement needed Phosphorus: 4.7 >> 4.5, improving, continue to monitor Magnesium: 2.2 >> 2.6, no replacement needed Check BMP, Mg with AM labs  Thank you for allowing pharmacy to be a part of this patient's care.  Gretel Acre, PharmD PGY1 Pharmacy Resident 12/28/2022 8:18 AM

## 2022-12-28 NOTE — Progress Notes (Signed)
NAME:  Coleman Kalas, MRN:  [id number], DOB:  02/08/1941, LOS: 6 ADMISSION DATE:  12/06/2022, CONSULTATION DATE: 12/14/2022 REFERRING MD: Dr. Blaine Hamper, CHIEF COMPLAINT: AMS   History of Present Illness:  This is an 82 yo male with hx of ETOH Abuse who presented to [facility] ER on 12/28 via EMS from home following a possible seizure activity the morning of 12/28.  He has a hx of non epileptic seizures, however he does not take antiepileptics.  He reported he has not had a seizure in a long time.  All hx obtained from pts chart and daughter.   ED Course Upon arrival to the ER pt noted to have severe tremors.  Pt reported he drinks 1 small alcoholic beverage every night prior to going to bed.  However, pts wife reported he drank hard liquor heavily over the past several days and abruptly stopped drinking 2 days ago.  He also complained of an upset stomach.  ER lab results revealed K+ 3.1/glucose 138/calcium 8.3/albumin 3.4/AST 81/ALT 62/total protein 6.4/magnesium 1.6/wbc 3.9/hgb 12.1/platelets 125.  COVID-19/Influenza A&B negative.  Pt had a CIWA score of 4, he received 1 mg of iv ativan.  Hospitalist initially contacted for hospital admission.  However, pt became increasingly agitated and combative despite receiving a total of 3 mg of iv ativan and 3 mg of IM haldol.  Security called to bedside.  PCCM team consulted due to pt requiring precedex gtt.  However, despite precedex gtt at maximal dose; additional 2 mg of iv ativan, 12.5 mg of iv fentanyl, and 2.5 mg of iv haldol pt remained combative/agitated requiring mechanical intubation.  Pt admitted to ICU per PCCM team for additional workup and treatment.    CT Head 12/28:  No evidence of acute intracranial abnormality. Mild chronic small vessel ischemic disease. CXR 12/28: No active cardiopulmonary disease.   Please see "significant hospital events" section below for full detailed hospital course.  Pertinent  Medical History  Anxiety  Arthritis  Asthma   Colon Polyps  COPD Depression  Type II Diabetes Mellitus Difficult Intubation  GERD  Hypercholesteremia  HLD HTN  Hyponatremia  OSA   Micro Data:  12/28: SARS-CoV-2 and influenza PCR>> negative 12/28: MRSA PCR>> negative  Antimicrobials:   Anti-infectives (From admission, onward)    None       Significant Hospital Events: Including procedures, antibiotic start and stop dates in addition to other pertinent events   12/28: Pt admitted with possible seizure activity suspect secondary to severe ETOH withdrawal requiring mechanical intubation  12/23/22- patient still having seizures despite propofol, the seizures do respond to Ativan.  Pharmacy is monitoring electrolytes.  He is weaning off levophed.  He will start feeds today.  ABG with res alkalosis and RR was reduced.  12/24/22- patient improved, dcd phenobarb and wake up assessment was very poor today with onset of tachyarrythmia in 140s and seizure like activity upon reduction of sedation.  12/25/22-  patient is critically ill, he is not tolerating reduction of sedation with emergence of seizure like activity and tachyarrythmia. Wife and nephew at bedside we discussed short term plan. Today we have transitioned to herparin from lovenox due to worsening renal impairment.  12/26/22- patient was not able to tolerate reduction in sedation with devt of severe hypertension and tachyarrythmia 12/27/22:  AKI slowly improving.  Start Precedex to assist with WUA. 12/28/22: Plan for MRI head today  Interim History / Subjective:  -No significant events noted overnight -Hemodynamically stable, no vasopressors -Has failed multiple  wake up attempts due to tachycardia, hypertension, and agitation -Continue Precedex to assist with wake up assessment -Creatinine 3.15>>2.42>>2.07  Objective   Blood pressure (!) 169/81, pulse 88, temperature 97.7 F (36.5 C), temperature source Axillary, resp. rate (!) 25, height _0  (1.626 m), weight 84 kg,  SpO2 97 %.    Vent Mode: PRVC FiO2 (%):  [28 %] 28 % Set Rate:  [16 bmp] 16 bmp Vt Set:  [400 mL] 400 mL PEEP:  [5 cmH20] 5 cmH20 Pressure Support:  [10 cmH20] 10 cmH20 Plateau Pressure:  [11 cmH20-26 cmH20] 26 cmH20   Intake/Output Summary (Last 24 hours) at 12/28/2022 1711 Last data filed at 12/28/2022 1701 Gross per 24 hour  Intake 1501.17 ml  Output 975 ml  Net 526.17 ml    Filed Weights   12/21/2022 2030 12/27/22 0500 12/28/22 0500  Weight: 80 kg 85.3 kg 84 kg    Examination: General: Acutely on chronicallyy-ill appearing male, mechanically ventilated, synchronous with the vent.  Sedated. HENT: Supple, difficult to assess for JVD due to body habitus  Lungs: Clear diminished throughout, no wheezing or rhonchi noted, even, non labored, synchronous with the vent Cardiovascular: Regular rate and rhythm (NSR on telemetry), s1s2, no r/g, 2+ radial/1+ distal, no edema  Abdomen: +BS x4, obese, soft, non distended  Extremities: Normal bulk and tone, no deformities, no edema moves all extremities  Neuro: Sedated, not following commands, pupils PERRL  GU: Indwelling foley catheter draining yellow urine  Resolved Hospital Problem list     Assessment & Plan:   Intubation and mechanical ventilation for airway protection in the setting of ETOH withdrawal  Hx: OSA, COPD, Asthma, and Difficult Intubation  -Full vent support, implement lung protective strategies -Plateau pressures less than 30 cm H20 -Wean FiO2 & PEEP as tolerated to maintain O2 sats 88 to 92% -Follow intermittent Chest X-ray & ABG as needed -Spontaneous Breathing Trials when respiratory parameters met and mental status permits -ContinueVAP Bundle -Bronchodilators & Pulmicort nebs  Hypertension Tachycardia, due to DT's Hx: Hypercholesteremia  -Continuous cardiac monitoring -Maintain MAP >65 -Continue Propranolol -Continue outpatient rosuvastatin   Acute Kidney Injury ~ IMPROVING -Monitor I&O's / urinary  output -Follow BMP -Ensure adequate renal perfusion -Avoid nephrotoxic agents as able -Replace electrolytes as indicated -Renal US negative -Nephrology following, appreciate input ~ no acute need for dialysis  Type II diabetes mellitus  -CBG's q4h; Target range of 140 to 180 -SSI -Follow ICU Hypo/Hyperglycemia protocol  Possible seizure activity in the setting of ETOH withdrawal  Delirium Tremens Sedation needs in setting of mechanical ventilation -CT Head on admission negative for acute intracranial abnormality -EEG 12/23/22 suggestive of severe to profound diffuse encephalopathy, nonspecific etiology but likely related to sedation. No seizures or epileptiform discharges were seen throughout the recording.  -Maintain a RASS goal of 0 to -1 -Fentanyl and Propofol as needed to maintain RASS goal -Avoid sedating medications as able -Daily wake up assessment ~ will start Precedex 12/27/22 -Seizure precautions  -CIWA protocol -Thiamine, folic acid, MVI - MRI today - Consider repeat EEG   Best Practice (right click and "Reselect all SmartList Selections" daily)   Diet/type: NPO, tube feeds DVT prophylaxis: Heparin SQ GI prophylaxis: H2B Lines: N/A Foley:  Yes, and it is still needed Code Status:  full code Last date of multidisciplinary goals of care discussion [12/27/22]  1/3: Updated pt's spouse at bedside.  Labs   CBC: Recent Labs  Lab 12/24/22 0406 12/25/22 0410 12/26/22 0440 12/27/22 9509 12/28/22 3267  WBC 8.2 7.1 6.3 5.3 4.7  HGB 10.3* 9.2* 11.4* 10.5* 9.8*  HCT 32.1* 29.0* 35.2* 33.2* 30.9*  MCV 101.9* 103.6* 103.2* 104.1* 102.7*  PLT 133* 129* 118* 139* 152     Basic Metabolic Panel: Recent Labs  Lab 12/23/22 0017 12/23/22 0603 12/24/22 0406 12/25/22 0410 12/25/22 2159 12/26/22 0440 12/27/22 0416 12/28/22 0322  NA  --  138 138 138 135 137 139 139  K 2.9* 4.1 4.4 4.8 4.9 4.6 4.5 4.7  CL  --  105 104 106 104 104 104 107  CO2  --  _0 20* 21*  24 18*  GLUCOSE  --  178* 181* 139* 158* 119* 97 150*  BUN  --  11 18 33* 42* 46* 57* 64*  CREATININE  --  0.96 1.95* 3.09* 3.29* 3.15* 2.42* 2.07*  CALCIUM  --  7.5* 8.2* 8.0* 8.1* 8.6* 8.5* 8.6*  MG 2.4 2.4 2.4  --  2.2  --   --  2.6*  PHOS 4.1 3.6 4.9* 5.7*  --  5.9* 4.7* 4.5    GFR: Estimated Creatinine Clearance: 27.8 mL/min (A) (by C-G formula based on SCr of 2.07 mg/dL (H)). Recent Labs  Lab 12/25/22 0410 12/26/22 0440 12/27/22 0416 12/28/22 0322  WBC 7.1 6.3 5.3 4.7     Liver Function Tests: Recent Labs  Lab 12/24/2022 1150 12/24/22 0406 12/25/22 0410 12/26/22 0440 12/27/22 0416 12/28/22 0322  AST 81*  --   --   --   --   --   ALT 62*  --   --   --   --   --   ALKPHOS 85  --   --   --   --   --   BILITOT 1.2  --   --   --   --   --   PROT 6.4*  --   --   --   --   --   ALBUMIN 3.4* 2.7* 2.4* 2.6* 2.2* 2.1*    No results for input(s): "LIPASE", "AMYLASE" in the last 168 hours. No results for input(s): "AMMONIA" in the last 168 hours.  ABG    Component Value Date/Time   PHART 7.41 12/23/2022 0423   PCO2ART 46 12/23/2022 0423   PO2ART 81 (L) 12/23/2022 0423   HCO3 29.1 (H) 12/23/2022 0423   O2SAT 98.5 12/23/2022 0423     Coagulation Profile: No results for input(s): "INR", "PROTIME" in the last 168 hours.  Cardiac Enzymes: Recent Labs  Lab 12/25/22 0409  CKTOTAL 81     HbA1C: Hgb A1c MFr Bld  Date/Time Value Ref Range Status  11/20/2022 05:04 AM 7.4 (H) 4.8 - 5.6 % Final    Comment:    (NOTE)         Prediabetes: 5.7 - 6.4         Diabetes: >6.4         Glycemic control for adults with diabetes: <7.0   02/15/2022 05:57 AM 5.9 (H) 4.8 - 5.6 % Final    Comment:    (NOTE)         Prediabetes: 5.7 - 6.4         Diabetes: >6.4         Glycemic control for adults with diabetes: <7.0     CBG: Recent Labs  Lab 12/27/22 2304 12/28/22 0324 12/28/22 0747 12/28/22 1128 12/28/22 1622  GLUCAP 133* 157* 161* 169* 128*     Review of  Systems:  Unable to assess pt mechanically intubated   Past Medical History:  He,  has a past medical history of Anxiety, Arthritis, Asthma, Colon polyps, COPD (chronic obstructive pulmonary disease) ([place]), Depression, Diabetes ([place]), Difficult intubation, GERD (gastroesophageal reflux disease), High cholesterol, Hyperlipidemia, Hypertension, Hyponatremia (02/23/2022), and Sleep apnea.   Surgical History:   Past Surgical History:  Procedure Laterality Date   BRONCHOSCOPY     CARDIAC CATHETERIZATION Left 11/04/2016   Procedure: Left Heart Cath and Coronary Angiography;  Surgeon: Teodoro Spray, MD;  Location: [place] [facility];  Service: Cardiovascular;  Laterality: Left;   CARDIAC CATHETERIZATION     CHOLECYSTECTOMY N/A 05/09/2019   Procedure: LAPAROSCOPIC CHOLECYSTECTOMY WITH INTRAOPERATIVE CHOLANGIOGRAM;  Surgeon: Jules Husbands, MD;  Location: [facility];  Service: General;  Laterality: N/A;   COLOSTOMY     IR KYPHO LUMBAR INC FX REDUCE BONE BX UNI/BIL CANNULATION INC/IMAGING  11/10/2022   IR RADIOLOGIST EVAL & MGMT  11/08/2022   LEFT HEART CATH AND CORONARY ANGIOGRAPHY N/A 12/14/2021   Procedure: LEFT HEART CATH AND CORONARY ANGIOGRAPHY;  Surgeon: Corey Skains, MD;  Location: [place] [facility];  Service: Cardiovascular;  Laterality: N/A;   ROTATOR CUFF REPAIR Left    THUMB ARTHROSCOPY  2015   TONSILLECTOMY  1952   TONSILLECTOMY       Social History:   reports that he has quit smoking. His smoking use included cigarettes. He has a 15.00 pack-year smoking history. He has never used smokeless tobacco. He reports current alcohol use of about 1.0 - 2.0 standard drink of alcohol per week. He reports that he does not use drugs.   Family History:  His family history includes Heart attack in his mother; Leukemia in his brother; Prostate cancer in his brother and father.   Allergies Allergies  Allergen Reactions   Ace Inhibitors Cough   Metoprolol Diarrhea    Patient has been  tolerating Propranolol 40 mg twice daily since at least 05/2022.   Nsaids Rash   Sulfa Antibiotics Rash     Home Medications  Prior to Admission medications   Medication Sig Start Date End Date Taking? Authorizing Provider  albuterol (ACCUNEB) 1.25 MG/3ML nebulizer solution Take 3 mLs by nebulization every 6 (six) hours as needed for wheezing. 11/20/22  Yes Wieting, Richard, MD  ALPRAZolam Duanne Moron) 0.5 MG tablet Take 0.5 mg by mouth daily as needed for anxiety. 10/06/22 10/06/23 Yes [provider]  aspirin EC 81 MG tablet Take 81 mg by mouth daily. Swallow whole.   Yes [provider]  diltiazem (CARDIZEM CD) 240 MG 24 hr capsule Take 240 mg by mouth every morning. 12/07/21  Yes [provider]  donepezil (ARICEPT) 5 MG tablet Take 5 mg by mouth at bedtime. 05/10/22  Yes [provider]  fluticasone (FLONASE) 50 MCG/ACT nasal spray Place 1 spray into both nostrils daily as needed for allergies or rhinitis.   Yes [provider]  folic acid (FOLVITE) 1 MG tablet Take 1 tablet (1 mg total) by mouth daily. 03/01/22  Yes Wieting, Richard, MD  gabapentin (NEURONTIN) 300 MG capsule Take 1 capsule (300 mg total) by mouth at bedtime. 02/28/22  Yes Wieting, Richard, MD  melatonin 5 MG TABS Take 1 tablet (5 mg total) by mouth at bedtime. 02/28/22  Yes Wieting, Richard, MD  nitroGLYCERIN (NITROSTAT) 0.4 MG SL tablet Place 0.4 mg under the tongue every 5 (five) minutes as needed for chest pain.   Yes [provider]  omeprazole ([place])  40 MG capsule Take 40 mg by mouth daily. 04/20/22  Yes [provider]  ondansetron (ZOFRAN-ODT) 4 MG disintegrating tablet Take 1 tablet (4 mg total) by mouth every 8 (eight) hours as needed for nausea or vomiting. 06/11/22  Yes Carrie Mew, MD  propranolol (INDERAL) 40 MG tablet Take 40 mg by mouth 2 (two) times daily. 11/07/22  Yes [provider]  QUEtiapine (SEROQUEL) 25 MG tablet One tab po qhs (can  take extra dose if not sleeping) 02/28/22  Yes Wieting, Richard, MD  rosuvastatin (CRESTOR) 5 MG tablet Take 5 mg by mouth daily. 05/10/22  Yes [provider]  SYMBICORT 80-4.5 MCG/ACT inhaler Inhale 2 puffs into the lungs 2 (two) times daily as needed.  05/19/20  Yes [provider]  venlafaxine XR (EFFEXOR-XR) 37.5 MG 24 hr capsule Take 37.5 mg by mouth daily. 12/07/21  Yes [provider]  azithromycin (ZITHROMAX) 250 MG tablet One tab po daily for three days Patient not taking: Reported on 12/12/2022 11/21/22   Loletha Grayer, MD  guaiFENesin (ROBITUSSIN) 100 MG/5ML liquid Take 5 mLs by mouth every 4 (four) hours as needed for cough or to loosen phlegm. Patient not taking: Reported on 12/06/2022 02/28/22   Loletha Grayer, MD  polyethylene glycol (MIRALAX / GLYCOLAX) 17 g packet Take 17 g by mouth daily as needed for moderate constipation. Patient not taking: Reported on 11/08/2022 02/28/22   Loletha Grayer, MD     Scheduled Meds:  budesonide (PULMICORT) nebulizer solution  0.5 mg Nebulization BID   Chlorhexidine Gluconate Cloth  6 each Topical Daily   docusate  100 mg Per Tube BID   feeding supplement (PROSource TF20)  60 mL Per Tube Daily   folic acid  1 mg Per Tube Daily   free water  30 mL Per Tube Q4H   heparin injection (subcutaneous)  5,000 Units Subcutaneous Q8H   insulin aspart  0-15 Units Subcutaneous TID WC   ipratropium-albuterol  3 mL Nebulization Q4H   multivitamin with minerals  1 tablet Per Tube Daily   mouth rinse  15 mL Mouth Rinse Q2H   polyethylene glycol  17 g Per Tube Daily   propranolol  40 mg Per Tube BID   QUEtiapine  25 mg Per Tube QHS   rosuvastatin  5 mg Per Tube Daily   thiamine (VITAMIN B1) injection  100 mg Intravenous Daily   venlafaxine  18.75 mg Per Tube BID WC   Continuous Infusions:  sodium chloride     dexmedetomidine (PRECEDEX) IV infusion 0.8 mcg/kg/hr (12/28/22 1701)   famotidine (PEPCID) IV Stopped (12/28/22 0919)    feeding supplement (VITAL AF 1.2 CAL) 30 mL/hr at 12/28/22 1701   PRN Meds:.albuterol, dextromethorphan-guaiFENesin, fentaNYL (SUBLIMAZE) injection, ondansetron (ZOFRAN) IV, mouth rinse   Critical care time: 40 minutes    Patient was discussed during multidisciplinary rounds.  Renold Don, MD Advanced Bronchoscopy PCCM [facility]    *This note was dictated using voice recognition software/Dragon.  Despite best efforts to proofread, errors can occur which can change the meaning. Any transcriptional errors that result from this process are unintentional and may not be fully corrected at the time of dictation.

## 2022-12-29 DIAGNOSIS — R569 Unspecified convulsions: Secondary | ICD-10-CM | POA: Diagnosis not present

## 2022-12-29 LAB — BLOOD GAS, VENOUS
Acid-base deficit: 3.3 mmol/L — ABNORMAL HIGH (ref 0.0–2.0)
Bicarbonate: 19.9 mmol/L — ABNORMAL LOW (ref 20.0–28.0)
O2 Saturation: 92.8 %
Patient temperature: 37
pCO2, Ven: 30 mmHg — ABNORMAL LOW (ref 44–60)
pH, Ven: 7.43 (ref 7.25–7.43)
pO2, Ven: 60 mmHg — ABNORMAL HIGH (ref 32–45)

## 2022-12-29 LAB — BASIC METABOLIC PANEL
Anion gap: 13 (ref 5–15)
BUN: 64 mg/dL — ABNORMAL HIGH (ref 8–23)
CO2: 20 mmol/L — ABNORMAL LOW (ref 22–32)
Calcium: 8.6 mg/dL — ABNORMAL LOW (ref 8.9–10.3)
Chloride: 104 mmol/L (ref 98–111)
Creatinine, Ser: 1.7 mg/dL — ABNORMAL HIGH (ref 0.61–1.24)
GFR, Estimated: 40 mL/min — ABNORMAL LOW (ref >60)
Glucose, Bld: 244 mg/dL — ABNORMAL HIGH (ref 70–99)
Potassium: 4.2 mmol/L (ref 3.5–5.1)
Sodium: 137 mmol/L (ref 135–145)

## 2022-12-29 LAB — GLUCOSE, CAPILLARY
Glucose-Capillary: 197 mg/dL — ABNORMAL HIGH (ref 70–99)
Glucose-Capillary: 220 mg/dL — ABNORMAL HIGH (ref 70–99)
Glucose-Capillary: 236 mg/dL — ABNORMAL HIGH (ref 70–99)
Glucose-Capillary: 239 mg/dL — ABNORMAL HIGH (ref 70–99)
Glucose-Capillary: 243 mg/dL — ABNORMAL HIGH (ref 70–99)
Glucose-Capillary: 264 mg/dL — ABNORMAL HIGH (ref 70–99)
Glucose-Capillary: 295 mg/dL — ABNORMAL HIGH (ref 70–99)

## 2022-12-29 LAB — CBC
HCT: 30.2 % — ABNORMAL LOW (ref 39.0–52.0)
Hemoglobin: 9.9 g/dL — ABNORMAL LOW (ref 13.0–17.0)
MCH: 32.7 pg (ref 26.0–34.0)
MCHC: 32.8 g/dL (ref 30.0–36.0)
MCV: 99.7 fL (ref 80.0–100.0)
Platelets: 175 10*3/uL (ref 150–400)
RBC: 3.03 MIL/uL — ABNORMAL LOW (ref 4.22–5.81)
RDW: 16.9 % — ABNORMAL HIGH (ref 11.5–15.5)
WBC: 4.9 10*3/uL (ref 4.0–10.5)
nRBC: 2.9 % — ABNORMAL HIGH (ref 0.0–0.2)

## 2022-12-29 LAB — MAGNESIUM: Magnesium: 2.3 mg/dL (ref 1.7–2.4)

## 2022-12-29 LAB — AMMONIA: Ammonia: 44 umol/L — ABNORMAL HIGH (ref 9–35)

## 2022-12-29 LAB — PHOSPHORUS: Phosphorus: 2.5 mg/dL (ref 2.5–4.6)

## 2022-12-29 MED ORDER — MIDAZOLAM HCL 2 MG/2ML IJ SOLN
INTRAMUSCULAR | Status: AC
  Administered 2022-12-29: 2 mg
  Filled 2022-12-29: qty 2

## 2022-12-29 MED ORDER — FUROSEMIDE 10 MG/ML IJ SOLN
40.0000 mg | Freq: Once | INTRAMUSCULAR | Status: AC
  Administered 2022-12-29: 40 mg via INTRAVENOUS
  Filled 2022-12-29: qty 4

## 2022-12-29 MED ORDER — FAMOTIDINE 40 MG/5ML PO SUSR: 20.0000 mg | Freq: Every day | ORAL | Status: DC

## 2022-12-29 MED ORDER — FAMOTIDINE 40 MG/5ML PO SUSR
20.0000 mg | Freq: Every day | ORAL | Status: DC
  Filled 2022-12-29: qty 2.5

## 2022-12-29 MED ORDER — INSULIN ASPART 100 UNIT/ML IJ SOLN
0.0000 [IU] | INTRAMUSCULAR | Status: DC
  Administered 2022-12-29: 5 [IU] via SUBCUTANEOUS

## 2022-12-29 MED ORDER — INSULIN ASPART 100 UNIT/ML IJ SOLN
0.0000 [IU] | INTRAMUSCULAR | Status: DC
  Administered 2022-12-29: 11 [IU] via SUBCUTANEOUS
  Administered 2022-12-29 (×2): 7 [IU] via SUBCUTANEOUS
  Administered 2022-12-29: 11 [IU] via SUBCUTANEOUS
  Administered 2022-12-29: 7 [IU] via SUBCUTANEOUS
  Administered 2022-12-29 – 2022-12-30 (×2): 4 [IU] via SUBCUTANEOUS
  Administered 2022-12-30 (×3): 7 [IU] via SUBCUTANEOUS
  Administered 2022-12-30: 4 [IU] via SUBCUTANEOUS
  Administered 2022-12-31 (×2): 7 [IU] via SUBCUTANEOUS
  Administered 2022-12-31: 3 [IU] via SUBCUTANEOUS
  Administered 2022-12-31 (×2): 4 [IU] via SUBCUTANEOUS
  Administered 2022-12-31: 7 [IU] via SUBCUTANEOUS
  Administered 2023-01-01 (×2): 4 [IU] via SUBCUTANEOUS
  Administered 2023-01-01: 7 [IU] via SUBCUTANEOUS
  Administered 2023-01-01: 4 [IU] via SUBCUTANEOUS
  Administered 2023-01-01 (×2): 7 [IU] via SUBCUTANEOUS
  Administered 2023-01-02 (×4): 4 [IU] via SUBCUTANEOUS
  Administered 2023-01-02: 7 [IU] via SUBCUTANEOUS
  Administered 2023-01-02: 4 [IU] via SUBCUTANEOUS
  Administered 2023-01-03: 7 [IU] via SUBCUTANEOUS
  Administered 2023-01-03 (×2): 4 [IU] via SUBCUTANEOUS
  Administered 2023-01-03: 3 [IU] via SUBCUTANEOUS
  Administered 2023-01-03 – 2023-01-04 (×5): 4 [IU] via SUBCUTANEOUS
  Administered 2023-01-04: 7 [IU] via SUBCUTANEOUS
  Administered 2023-01-04 – 2023-01-05 (×3): 4 [IU] via SUBCUTANEOUS
  Administered 2023-01-05 (×2): 7 [IU] via SUBCUTANEOUS
  Administered 2023-01-05 – 2023-01-06 (×7): 4 [IU] via SUBCUTANEOUS
  Administered 2023-01-07: 3 [IU] via SUBCUTANEOUS
  Administered 2023-01-07 (×3): 4 [IU] via SUBCUTANEOUS
  Administered 2023-01-07: 7 [IU] via SUBCUTANEOUS
  Administered 2023-01-08: 4 [IU] via SUBCUTANEOUS
  Administered 2023-01-08: 7 [IU] via SUBCUTANEOUS
  Administered 2023-01-08: 4 [IU] via SUBCUTANEOUS
  Administered 2023-01-08: 3 [IU] via SUBCUTANEOUS
  Administered 2023-01-08: 4 [IU] via SUBCUTANEOUS
  Filled 2022-12-29 (×57): qty 1

## 2022-12-29 MED ORDER — LABETALOL HCL 5 MG/ML IV SOLN
10.0000 mg | Freq: Once | INTRAVENOUS | Status: AC
  Administered 2022-12-29: 10 mg via INTRAVENOUS
  Filled 2022-12-29: qty 4

## 2022-12-29 MED ORDER — PROPOFOL 1000 MG/100ML IV EMUL
INTRAVENOUS | Status: AC
  Administered 2022-12-29: 30 ug/kg/min
  Filled 2022-12-29: qty 100

## 2022-12-29 MED ORDER — HYDRALAZINE HCL 20 MG/ML IJ SOLN
20.0000 mg | Freq: Once | INTRAMUSCULAR | Status: AC
  Administered 2022-12-29: 20 mg via INTRAVENOUS
  Filled 2022-12-29: qty 1

## 2022-12-29 MED ORDER — PROPOFOL 1000 MG/100ML IV EMUL
5.0000 ug/kg/min | INTRAVENOUS | Status: DC
  Administered 2022-12-29: 40 ug/kg/min via INTRAVENOUS
  Administered 2022-12-29: 40.675 ug/kg/min via INTRAVENOUS
  Administered 2022-12-29: 30 ug/kg/min via INTRAVENOUS
  Administered 2022-12-29: 35 ug/kg/min via INTRAVENOUS
  Administered 2022-12-29 (×2): 40 ug/kg/min via INTRAVENOUS
  Administered 2022-12-30: 35 ug/kg/min via INTRAVENOUS
  Filled 2022-12-29 (×6): qty 100

## 2022-12-29 MED ORDER — FAMOTIDINE 40 MG/5ML PO SUSR
20.0000 mg | Freq: Every day | ORAL | Status: DC
  Administered 2022-12-29 – 2022-12-31 (×3): 20 mg
  Filled 2022-12-29 (×3): qty 2.5

## 2022-12-29 MED ORDER — HYDRALAZINE HCL 20 MG/ML IJ SOLN
10.0000 mg | Freq: Four times a day (QID) | INTRAMUSCULAR | Status: DC | PRN
  Administered 2022-12-29 – 2022-12-30 (×2): 10 mg via INTRAVENOUS
  Filled 2022-12-29 (×2): qty 1

## 2022-12-29 NOTE — Inpatient Diabetes Management (Signed)
Inpatient Diabetes Program Recommendations  AACE/ADA: New Consensus Statement on Inpatient Glycemic Control   Target Ranges:  Prepandial:   less than 140 mg/dL      Peak postprandial:   less than 180 mg/dL (1-2 hours)      Critically ill patients:  140 - 180 mg/dL    Latest Reference Range & Units 12/29/22 03:13 12/29/22 07:31  Glucose-Capillary 70 - 99 mg/dL 220 (H) 197 (H)    Latest Reference Range & Units 12/28/22 07:47 12/28/22 11:28 12/28/22 16:22 12/28/22 19:36 12/28/22 23:30  Glucose-Capillary 70 - 99 mg/dL 161 (H) 169 (H) 128 (H) 146 (H) 229 (H)   Review of Glycemic Control  Diabetes history: DM2 Outpatient Diabetes medications: None Current orders for Inpatient glycemic control: Novolog 0-20 units Q4H; Vital @ 60 ml/hr  Inpatient Diabetes Program Recommendations:    Insulin: Please consider ordering Novolog 3 units Q4H for tube feeding coverage. If tube feeding is stopped or held then Novolog tube feeding coverage should also be stopped or held.  Thanks, Barnie Alderman, RN, MSN, [place] Diabetes Coordinator Inpatient Diabetes Program [phone] (Team Pager from 8am to [place])

## 2022-12-29 NOTE — Progress Notes (Signed)
Acute Encephalopathy Called bedside by nursing to evaluate patient. Patient on precedex, RASS: -5 but increasingly tachypneic in the 40's & hypertensive with SBP >180. MRI brain 12/28/21: no acute intracranial abnormality - versed administered with no effect - labetalol & hydralazine administered with limited effect - precedex discontinued & propofol restarted, wean as tolerated - f/u EEG ordered for this AM - consider neurology consultation as needed    Venetia Night, AGACNP-BC Acute Care Nurse Practitioner [place]   [phone] / [phone] Please see Amion for pager details.

## 2022-12-29 NOTE — Progress Notes (Signed)
Patient's respiratory rate is between 30-37 and labored. BP is greater than 170. Precedex titrated to 1.66mq for tachypnea and hypertension. Patient received fentanyl prn, labetalol 10 mg, hydralazine 20 mg,  versed 2 mg ivpush. NP assessed patient at bedside.

## 2022-12-29 NOTE — Progress Notes (Signed)
[facility]  ROUNDING NOTE   Subjective:   Patient seen and evaluated at bedside in ICU Sedation: Propofol Vent 28% FiO2 1+ edema in thighs Tube feed at 50 mL/h  Creatinine 1.7 UOP 698m  Objective:  Vital signs in last 24 hours:  Temp:  [97.7 F (36.5 C)-99 F (37.2 C)] 97.7 F (36.5 C) (01/04 0800) Pulse Rate:  [54-104] 101 (01/04 0800) Resp:  [24-42] 29 (01/04 0800) BP: (122-198)/(67-93) 145/68 (01/04 0800) SpO2:  [85 %-100 %] 100 % (01/04 1204) FiO2 (%):  [28 %-35 %] 35 % (01/04 1204) Weight:  [85.1 kg] 85.1 kg (01/04 0500)  Weight change: 1.1 kg Filed Weights   12/27/22 0500 12/28/22 0500 12/29/22 0500  Weight: 85.3 kg 84 kg 85.1 kg    Intake/Output: I/O last 3 completed shifts: In: 2148.1 [I.V.:484.5; NG/GT:1613.7; IV Piggyback:50] Out: 12542[Urine:1225; Stool:100]   Intake/Output this shift:  Total I/O In: 135 [I.V.:105; NG/GT:30] Out: -  Follow-up no Physical Exam: General: Ill appearing  Head: Normocephalic, atraumatic.   Eyes: Anicteric  Lungs:  Scattered rhonchi, vent assisted  Heart: S1S2 present, tachycardic  Abdomen:  Soft, nontender  Extremities: 1+ peripheral edema.  Neurologic: Sedated  Skin: No lesions  Access: None    Basic Metabolic Panel: Recent Labs  Lab 12/23/22 0603 12/24/22 0406 12/25/22 0410 12/25/22 2159 12/26/22 0440 12/27/22 0416 12/28/22 0322 12/29/22 0204  NA 138 138 138 135 137 139 139 137  K 4.1 4.4 4.8 4.9 4.6 4.5 4.7 4.2  CL 105 104 106 104 104 104 107 104  CO2 '24 23 23 '$ 20* 21* 24 18* 20*  GLUCOSE 178* 181* 139* 158* 119* 97 150* 244*  BUN 11 18 33* 42* 46* 57* 64* 64*  CREATININE 0.96 1.95* 3.09* 3.29* 3.15* 2.42* 2.07* 1.70*  CALCIUM 7.5* 8.2* 8.0* 8.1* 8.6* 8.5* 8.6* 8.6*  MG 2.4 2.4  --  2.2  --   --  2.6* 2.3  PHOS 3.6 4.9* 5.7*  --  5.9* 4.7* 4.5 2.5     Liver Function Tests: Recent Labs  Lab 12/24/22 0406 12/25/22 0410 12/26/22 0440 12/27/22 0416 12/28/22 0322  ALBUMIN 2.7*  2.4* 2.6* 2.2* 2.1*    No results for input(s): "LIPASE", "AMYLASE" in the last 168 hours. Recent Labs  Lab 12/29/22 1113  AMMONIA 44*    CBC: Recent Labs  Lab 12/25/22 0410 12/26/22 0440 12/27/22 0416 12/28/22 0322 12/29/22 0204  WBC 7.1 6.3 5.3 4.7 4.9  HGB 9.2* 11.4* 10.5* 9.8* 9.9*  HCT 29.0* 35.2* 33.2* 30.9* 30.2*  MCV 103.6* 103.2* 104.1* 102.7* 99.7  PLT 129* 118* 139* 152 175     Cardiac Enzymes: Recent Labs  Lab 12/25/22 0409  CKTOTAL 81     BNP: Invalid input(s): "POCBNP"  CBG: Recent Labs  Lab 12/28/22 1936 12/28/22 2330 12/29/22 0313 12/29/22 0731 12/29/22 1133  GLUCAP 146* 229* 220* 197* 23     Microbiology: Results for orders placed or performed during the hospital encounter of 12/14/2022  Resp Panel by RT-PCR (Flu A&B, Covid) Anterior Nasal Swab     Status: None   Collection Time: 12/25/2022  1:14 PM   Specimen: Anterior Nasal Swab  Result Value Ref Range Status   SARS Coronavirus 2 by RT PCR NEGATIVE NEGATIVE Final    Comment: (NOTE) SARS-CoV-2 target nucleic acids are NOT DETECTED.  The SARS-CoV-2 RNA is generally detectable in upper respiratory specimens during the acute phase of infection. The lowest concentration of SARS-CoV-2 viral copies this assay  can detect is 138 copies/mL. A negative result does not preclude SARS-Cov-2 infection and should not be used as the sole basis for treatment or other patient management decisions. A negative result may occur with  improper specimen collection/handling, submission of specimen other than nasopharyngeal swab, presence of viral mutation(s) within the areas targeted by this assay, and inadequate number of viral copies(<138 copies/mL). A negative result must be combined with clinical observations, patient history, and epidemiological information. The expected result is Negative.  Fact Sheet for Patients:  EntrepreneurPulse.com.au  Fact Sheet for Healthcare Providers:   IncredibleEmployment.be  This test is no t yet approved or cleared by the [place] FDA and  has been authorized for detection and/or diagnosis of SARS-CoV-2 by FDA under an Emergency Use Authorization (EUA). This EUA will remain  in effect (meaning this test can be used) for the duration of the COVID-19 declaration under Section 564(b)(1) of the Act, 21 U.S.C.section 360bbb-3(b)(1), unless the authorization is terminated  or revoked sooner.       Influenza A by PCR NEGATIVE NEGATIVE Final   Influenza B by PCR NEGATIVE NEGATIVE Final    Comment: (NOTE) The Xpert Xpress SARS-CoV-2/FLU/RSV plus assay is intended as an aid in the diagnosis of influenza from Nasopharyngeal swab specimens and should not be used as a sole basis for treatment. Nasal washings and aspirates are unacceptable for Xpert Xpress SARS-CoV-2/FLU/RSV testing.  Fact Sheet for Patients: EntrepreneurPulse.com.au  Fact Sheet for Healthcare Providers: IncredibleEmployment.be  This test is not yet approved or cleared by the [place] FDA and has been authorized for detection and/or diagnosis of SARS-CoV-2 by FDA under an Emergency Use Authorization (EUA). This EUA will remain in effect (meaning this test can be used) for the duration of the COVID-19 declaration under Section 564(b)(1) of the Act, 21 U.S.C. section 360bbb-3(b)(1), unless the authorization is terminated or revoked.  Performed at [facility], [address] [place], [place] [address]   MRSA Next Gen by PCR, Nasal     Status: None   Collection Time: 12/25/2022  8:43 PM   Specimen: Nasal Mucosa; Nasal Swab  Result Value Ref Range Status   MRSA by PCR Next Gen NOT DETECTED NOT DETECTED Final    Comment: (NOTE) The GeneXpert MRSA Assay (FDA approved for NASAL specimens only), is one component of a comprehensive MRSA colonization surveillance program. It is not intended to  diagnose MRSA infection nor to guide or monitor treatment for MRSA infections. Test performance is not FDA approved in patients less than 39 years old. Performed at [facility], [address] [place], [place] [address]     Coagulation Studies: No results for input(s): "LABPROT", "INR" in the last 72 hours.  Urinalysis: No results for input(s): "COLORURINE", "LABSPEC", "PHURINE", "GLUCOSEU", "HGBUR", "BILIRUBINUR", "KETONESUR", "PROTEINUR", "UROBILINOGEN", "NITRITE", "LEUKOCYTESUR" in the last 72 hours.  Invalid input(s): "APPERANCEUR"    Imaging: MR BRAIN WO CONTRAST  Result Date: 12/28/2022 CLINICAL DATA:  Mental status change of unknown cause.  Seizure. EXAM: MRI HEAD WITHOUT CONTRAST TECHNIQUE: Multiplanar, multiecho pulse sequences of the brain and surrounding structures were obtained without intravenous contrast. COMPARISON:  Head CT 12/20/2022 FINDINGS: Brain: Diffusion imaging does not show any acute or subacute infarction or other cause of restricted diffusion. There is generalized age related atrophy. No focal abnormality affects the brainstem or cerebellum. Cerebral hemispheres show only minimal changes of small vessel disease of the white matter, less than often seen at this age. No cortical or large vessel territory  infarction. Chronic calcification in the posterolateral left thalamus which is nonprogressive. No evidence of mass lesion, hemorrhage, hydrocephalus or extra-axial collection. Vascular: Major vessels at the base of the brain show flow. Skull and upper cervical spine: Negative Sinuses/Orbits: Sinus mucosal thickening and layering fluid as often seen in patients with mechanical ventilation. Mastoid effusions as often seen in patients with mechanical ventilation. Orbits negative. Other: None IMPRESSION: 1. No acute or reversible finding. Generalized age related atrophy. Minimal small vessel change of the cerebral hemispheric white matter, less than often seen at  this age. Chronic nonprogressive benign type calcification within the posterolateral left thalamus. 2. Sinus mucosal thickening and layering fluid as often seen in patients with mechanical ventilation. Mastoid effusions as often seen in patients with mechanical ventilation. Electronically Signed   By: Nelson Chimes M.D.   On: 12/28/2022 14:34   DG Chest Port 1 View  Result Date: 12/28/2022 CLINICAL DATA:  Check endotracheal tube placement EXAM: PORTABLE CHEST 1 VIEW COMPARISON:  12/24/2022 FINDINGS: Endotracheal tube and gastric catheter are again seen and stable. Cardiac shadow is within normal limits. The lungs are hypoinflated but clear. No bony abnormality is noted. IMPRESSION: Tubes and lines in stable position. Poor inspiratory effort without focal infiltrate. Electronically Signed   By: Inez Catalina M.D.   On: 12/28/2022 01:11   DG Abd 1 View  Result Date: 12/28/2022 CLINICAL DATA:  Abdominal distension EXAM: ABDOMEN - 1 VIEW COMPARISON:  12/25/2022 FINDINGS: Gastric catheter is again noted in the stomach. Changes of prior vertebral augmentation at L2 are seen. Scattered large and small bowel gas is noted. No obstructive changes are seen. No acute bony abnormality is noted. IMPRESSION: Gastric catheter within the stomach.  No acute abnormality noted. Electronically Signed   By: Inez Catalina M.D.   On: 12/28/2022 01:10     Medications:    sodium chloride     feeding supplement (VITAL AF 1.2 CAL) 40 mL/hr at 12/29/22 0700   propofol (DIPRIVAN) infusion 40 mcg/kg/min (12/29/22 1210)    budesonide (PULMICORT) nebulizer solution  0.5 mg Nebulization BID   Chlorhexidine Gluconate Cloth  6 each Topical Daily   docusate  100 mg Per Tube BID   famotidine  20 mg Per Tube Daily   feeding supplement (PROSource TF20)  60 mL Per Tube Daily   folic acid  1 mg Per Tube Daily   free water  30 mL Per Tube Q4H   heparin injection (subcutaneous)  5,000 Units Subcutaneous Q8H   insulin aspart  0-20 Units  Subcutaneous Q4H   ipratropium-albuterol  3 mL Nebulization Q4H   multivitamin with minerals  1 tablet Per Tube Daily   mouth rinse  15 mL Mouth Rinse Q2H   polyethylene glycol  17 g Per Tube Daily   propranolol  40 mg Per Tube BID   QUEtiapine  25 mg Per Tube QHS   rosuvastatin  5 mg Per Tube Daily   thiamine (VITAMIN B1) injection  100 mg Intravenous Daily   venlafaxine  18.75 mg Per Tube BID WC   albuterol, dextromethorphan-guaiFENesin, fentaNYL (SUBLIMAZE) injection, hydrALAZINE, ondansetron (ZOFRAN) IV, mouth rinse  Assessment/ Plan:  [name] is a 82 y.o.  male with a PMHx of advanced COPD, asthma, diabetes mellitus type 2, GERD, hyperlipidemia, hypertension, obstructive sleep apnea, anxiety, who was admitted to [facility] on 12/01/2022 for evaluation of severe tremors.  Patient has history of alcohol intake nightly.  He developed significant delirium tremens upon admission and subsequently intubated  for airway protection.    Acute kidney injury secondary to acute tubular necrosis from hypotension. Patient was on pressors earlier in the course of admission but now weaned off. Renal ultrasound negative for obstruction.    Creatinine continues to improve however patient experienced decreased urine output.  We feel this is likely due to the transition from a Foley catheter to a pure wick system.  No acute need for dialysis at this time but we will continue to monitor.  Lab Results  Component Value Date   CREATININE 1.70 (H) 12/29/2022   CREATININE 2.07 (H) 12/28/2022   CREATININE 2.42 (H) 12/27/2022    Intake/Output Summary (Last 24 hours) at 12/29/2022 1317 Last data filed at 12/29/2022 1210 Gross per 24 hour  Intake 1053.78 ml  Output 350 ml  Net 703.78 ml     2.  Acute respiratory failure.  Weaning trial failed yesterday due to agitation.  Continues to require intubation with vent at 28%.   3.  Delirium tremors.  Remains sedated on propofol.    LOS: [place] 1/4/20241:17 PM

## 2022-12-29 NOTE — Progress Notes (Signed)
[facility] for Electrolyte Monitoring and Replacement   Recent Labs: Potassium (mmol/L)  Date Value  12/29/2022 4.2   Magnesium (mg/dL)  Date Value  12/29/2022 2.3   Calcium (mg/dL)  Date Value  12/29/2022 8.6 (L)   Albumin (g/dL)  Date Value  12/28/2022 2.1 (L)   Phosphorus (mg/dL)  Date Value  12/29/2022 2.5   Sodium (mmol/L)  Date Value  12/29/2022 137  Corrected Ca: 9.9 mg/dL  Assessment  Brett Blake is a 82 y.o. male presenting with seizure. PMH significant for AUD, HTN, HLD, DM, COPD, dementia, depression with anxiety, pseudoseizure, former smoker. Pharmacy has been consulted to monitor and replace electrolytes.  Diet: TF @ 30 mL/hr  Goal of Therapy: Electrolytes WNL  Plan:  Potassium: 4.7 >> 4.2, no replacement needed Phosphorus: 4.5 >> 2.5, improving, continue to monitor Magnesium: 2.2 >> 2.3, no replacement needed Check BMP, Mg, Phos with AM labs  Thank you for allowing pharmacy to be a part of this patient's care.  Gretel Acre, PharmD PGY1 Pharmacy Resident 12/29/2022 8:08 AM

## 2022-12-29 NOTE — Progress Notes (Signed)
Updated pt's wife at bedside.   All questions answered.    Merek Niu, AGACNP-BC [facility] Prefer epic messenger for cross cover needs If after hours, please call E-link  

## 2022-12-29 NOTE — Progress Notes (Signed)
NAME:  Brett Blake, MRN:  [id number], DOB:  1941/11/22, LOS: 7 ADMISSION DATE:  12/23/2022, CONSULTATION DATE: 12/14/2022 REFERRING MD: Dr. Blaine Hamper, CHIEF COMPLAINT: AMS   History of Present Illness:  This is an 82 yo male with hx of ETOH Abuse who presented to [facility] ER on 12/28 via EMS from home following a possible seizure activity the morning of 12/28.  He has a hx of non epileptic seizures, however he does not take antiepileptics.  He reported he has not had a seizure in a long time.  All hx obtained from pts chart and daughter.   ED Course Upon arrival to the ER pt noted to have severe tremors.  Pt reported he drinks 1 small alcoholic beverage every night prior to going to bed.  However, pts wife reported he drank hard liquor heavily over the past several days and abruptly stopped drinking 2 days ago.  He also complained of an upset stomach.  ER lab results revealed K+ 3.1/glucose 138/calcium 8.3/albumin 3.4/AST 81/ALT 62/total protein 6.4/magnesium 1.6/wbc 3.9/hgb 12.1/platelets 125.  COVID-19/Influenza A&B negative.  Pt had a CIWA score of 4, he received 1 mg of iv ativan.  Hospitalist initially contacted for hospital admission.  However, pt became increasingly agitated and combative despite receiving a total of 3 mg of iv ativan and 3 mg of IM haldol.  Security called to bedside.  PCCM team consulted due to pt requiring precedex gtt.  However, despite precedex gtt at maximal dose; additional 2 mg of iv ativan, 12.5 mg of iv fentanyl, and 2.5 mg of iv haldol pt remained combative/agitated requiring mechanical intubation.  Pt admitted to ICU per PCCM team for additional workup and treatment.    CT Head 12/28:  No evidence of acute intracranial abnormality. Mild chronic small vessel ischemic disease. CXR 12/28: No active cardiopulmonary disease.   Please see "significant hospital events" section below for full detailed hospital course.  Pertinent  Medical History  Anxiety  Arthritis  Asthma   Colon Polyps  COPD Depression  Type II Diabetes Mellitus Difficult Intubation  GERD  Hypercholesteremia  HLD HTN  Hyponatremia  OSA   Micro Data:  12/28: SARS-CoV-2 and influenza PCR>> negative 12/28: MRSA PCR>> negative  Antimicrobials:   Anti-infectives (From admission, onward)    None       Significant Hospital Events: Including procedures, antibiotic start and stop dates in addition to other pertinent events   12/28: Pt admitted with possible seizure activity suspect secondary to severe ETOH withdrawal requiring mechanical intubation  12/23/22- patient still having seizures despite propofol, the seizures do respond to Ativan.  Pharmacy is monitoring electrolytes.  He is weaning off levophed.  He will start feeds today.  ABG with res alkalosis and RR was reduced.  12/24/22- patient improved, dcd phenobarb and wake up assessment was very poor today with onset of tachyarrythmia in 140s and seizure like activity upon reduction of sedation.  12/25/22-  patient is critically ill, he is not tolerating reduction of sedation with emergence of seizure like activity and tachyarrythmia. Wife and nephew at bedside we discussed short term plan. Today we have transitioned to herparin from lovenox due to worsening renal impairment.  12/26/22- patient was not able to tolerate reduction in sedation with devt of severe hypertension and tachyarrythmia 12/27/22:  AKI slowly improving.  Start Precedex to assist with WUA. 12/28/22: Plan for MRI head today  Interim History / Subjective:  -No significant events noted overnight -Hemodynamically stable, no vasopressors -Has failed multiple  wake up attempts due to tachycardia, hypertension, and agitation -Continue Precedex to assist with wake up assessment -Creatinine 3.15>>2.42>>2.07  Objective   Blood pressure 132/67, pulse 99, temperature 99 F (37.2 C), temperature source Oral, resp. rate (!) 28, height _0  (1.626 m), weight 85.1 kg, SpO2 99  %.    Vent Mode: PRVC FiO2 (%):  [28 %] 28 % Set Rate:  [16 bmp] 16 bmp Vt Set:  [400 mL] 400 mL PEEP:  [5 cmH20] 5 cmH20 Plateau Pressure:  [26 cmH20] 26 cmH20   Intake/Output Summary (Last 24 hours) at 12/29/2022 0801 Last data filed at 12/29/2022 0700 Gross per 24 hour  Intake 1256.25 ml  Output 670 ml  Net 586.25 ml    Filed Weights   12/27/22 0500 12/28/22 0500 12/29/22 0500  Weight: 85.3 kg 84 kg 85.1 kg    Examination: General: Acutely on chronicallyy-ill appearing male, mechanically ventilated, synchronous with the vent.  Sedated. HENT: Supple, difficult to assess for JVD due to body habitus, orally intubated Lungs: Clear diminished throughout, no wheezing or rhonchi noted, even, non labored, synchronous with the vent Cardiovascular: Tachycardia, regular rhythm (ST on telemetry), s1s2, no r/g, 2+ radial/1+ distal, no edema  Abdomen: +BS x4, obese, soft, non distended  Extremities: Normal bulk and tone, no deformities, no edema moves all extremities  Neuro: Sedated, not following commands, pupils PERRL  GU: Indwelling foley catheter draining yellow urine  Resolved Hospital Problem list     Assessment & Plan:   Intubation and mechanical ventilation for airway protection in the setting of ETOH withdrawal  Hx: OSA, COPD, Asthma, and Difficult Intubation  -Full vent support, implement lung protective strategies -Plateau pressures less than 30 cm H20 -Wean FiO2 & PEEP as tolerated to maintain O2 sats 88 to 92% -Follow intermittent Chest X-ray & ABG as needed -Spontaneous Breathing Trials when respiratory parameters met and mental status permits -ContinueVAP Bundle -Bronchodilators & Pulmicort nebs  Hypertension Tachycardia, due to DT's Hx: Hypercholesteremia  -Continuous cardiac monitoring -Maintain MAP >65 -Continue Propranolol -Continue outpatient rosuvastatin   Acute Kidney Injury ~ IMPROVING -Monitor I&O's / urinary output -Follow BMP -Ensure adequate renal  perfusion -Avoid nephrotoxic agents as able -Replace electrolytes as indicated -Renal US negative -Nephrology following, appreciate input ~ no acute need for dialysis  Type II diabetes mellitus  -CBG's q4h; Target range of 140 to 180 -SSI -Follow ICU Hypo/Hyperglycemia protocol  Possible seizure activity in the setting of ETOH withdrawal  Delirium Tremens Sedation needs in setting of mechanical ventilation -CT Head on admission negative for acute intracranial abnormality -EEG 12/23/22 suggestive of severe to profound diffuse encephalopathy, nonspecific etiology but likely related to sedation. No seizures or epileptiform discharges were seen throughout the recording.  -MRI 1/3 with no acute or reversible finding.  Chronic age related changes -Maintain a RASS goal of 0 to -1 -Fentanyl and Propofol as needed to maintain RASS goal -Avoid sedating medications as able -Daily wake up assessment  -Seizure precautions  -CIWA protocol -Thiamine, folic acid, MVI - Repeat EEG 1/4 -Check TSH and ammonia -Low threshold for Neurology consultation    Pt is critically ill, prognosis is guarded.  Given acute illness superimposed on chronic co-morbidities and ETOH abuse, long term prognosis is poor.  Recommend DNR status.   Best Practice (right click and "Reselect all SmartList Selections" daily)   Diet/type: NPO, tube feeds DVT prophylaxis: Heparin SQ GI prophylaxis: H2B Lines: N/A Foley:  Yes, and it is still needed Code Status:  full code  Last date of multidisciplinary goals of care discussion [12/29/22]  1/4: Updated pt's family/friend at bedside.  Labs   CBC: Recent Labs  Lab 12/25/22 0410 12/26/22 0440 12/27/22 0416 12/28/22 0322 12/29/22 0204  WBC 7.1 6.3 5.3 4.7 4.9  HGB 9.2* 11.4* 10.5* 9.8* 9.9*  HCT 29.0* 35.2* 33.2* 30.9* 30.2*  MCV 103.6* 103.2* 104.1* 102.7* 99.7  PLT 129* 118* 139* 152 175     Basic Metabolic Panel: Recent Labs  Lab 12/23/22 0603  12/24/22 0406 12/25/22 0410 12/25/22 2159 12/26/22 0440 12/27/22 0416 12/28/22 0322 12/29/22 0204  NA 138 138 138 135 137 139 139 137  K 4.1 4.4 4.8 4.9 4.6 4.5 4.7 4.2  CL 105 104 106 104 104 104 107 104  CO2 _0 20* 21* 24 18* 20*  GLUCOSE 178* 181* 139* 158* 119* 97 150* 244*  BUN 11 18 33* 42* 46* 57* 64* 64*  CREATININE 0.96 1.95* 3.09* 3.29* 3.15* 2.42* 2.07* 1.70*  CALCIUM 7.5* 8.2* 8.0* 8.1* 8.6* 8.5* 8.6* 8.6*  MG 2.4 2.4  --  2.2  --   --  2.6* 2.3  PHOS 3.6 4.9* 5.7*  --  5.9* 4.7* 4.5 2.5    GFR: Estimated Creatinine Clearance: 34.1 mL/min (A) (by C-G formula based on SCr of 1.7 mg/dL (H)). Recent Labs  Lab 12/26/22 0440 12/27/22 0416 12/28/22 0322 12/29/22 0204  WBC 6.3 5.3 4.7 4.9     Liver Function Tests: Recent Labs  Lab 12/10/2022 1150 12/24/22 0406 12/25/22 0410 12/26/22 0440 12/27/22 0416 12/28/22 0322  AST 81*  --   --   --   --   --   ALT 62*  --   --   --   --   --   ALKPHOS 85  --   --   --   --   --   BILITOT 1.2  --   --   --   --   --   PROT 6.4*  --   --   --   --   --   ALBUMIN 3.4* 2.7* 2.4* 2.6* 2.2* 2.1*    No results for input(s): "LIPASE", "AMYLASE" in the last 168 hours. No results for input(s): "AMMONIA" in the last 168 hours.  ABG    Component Value Date/Time   PHART 7.41 12/23/2022 0423   PCO2ART 46 12/23/2022 0423   PO2ART 81 (L) 12/23/2022 0423   HCO3 19.9 (L) 12/29/2022 0206   ACIDBASEDEF 3.3 (H) 12/29/2022 0206   O2SAT 92.8 12/29/2022 0206     Coagulation Profile: No results for input(s): "INR", "PROTIME" in the last 168 hours.  Cardiac Enzymes: Recent Labs  Lab 12/25/22 0409  CKTOTAL 81     HbA1C: Hgb A1c MFr Bld  Date/Time Value Ref Range Status  11/20/2022 05:04 AM 7.4 (H) 4.8 - 5.6 % Final    Comment:    (NOTE)         Prediabetes: 5.7 - 6.4         Diabetes: >6.4         Glycemic control for adults with diabetes: <7.0   02/15/2022 05:57 AM 5.9 (H) 4.8 - 5.6 % Final    Comment:     (NOTE)         Prediabetes: 5.7 - 6.4         Diabetes: >6.4         Glycemic control for adults with diabetes: <7.0     CBG: Recent  Labs  Lab 12/28/22 1622 12/28/22 1936 12/28/22 2330 12/29/22 0313 12/29/22 0731  GLUCAP 128* 146* 229* 220* 197*     Review of Systems:   Unable to assess pt mechanically intubated   Past Medical History:  He,  has a past medical history of Anxiety, Arthritis, Asthma, Colon polyps, COPD (chronic obstructive pulmonary disease) ([place]), Depression, Diabetes ([place]), Difficult intubation, GERD (gastroesophageal reflux disease), High cholesterol, Hyperlipidemia, Hypertension, Hyponatremia (02/23/2022), and Sleep apnea.   Surgical History:   Past Surgical History:  Procedure Laterality Date   BRONCHOSCOPY     CARDIAC CATHETERIZATION Left 11/04/2016   Procedure: Left Heart Cath and Coronary Angiography;  Surgeon: Teodoro Spray, MD;  Location: [place] [facility];  Service: Cardiovascular;  Laterality: Left;   CARDIAC CATHETERIZATION     CHOLECYSTECTOMY N/A 05/09/2019   Procedure: LAPAROSCOPIC CHOLECYSTECTOMY WITH INTRAOPERATIVE CHOLANGIOGRAM;  Surgeon: Jules Husbands, MD;  Location: [facility];  Service: General;  Laterality: N/A;   COLOSTOMY     IR KYPHO LUMBAR INC FX REDUCE BONE BX UNI/BIL CANNULATION INC/IMAGING  11/10/2022   IR RADIOLOGIST EVAL & MGMT  11/08/2022   LEFT HEART CATH AND CORONARY ANGIOGRAPHY N/A 12/14/2021   Procedure: LEFT HEART CATH AND CORONARY ANGIOGRAPHY;  Surgeon: Corey Skains, MD;  Location: [place] [facility];  Service: Cardiovascular;  Laterality: N/A;   ROTATOR CUFF REPAIR Left    THUMB ARTHROSCOPY  2015   TONSILLECTOMY  1952   TONSILLECTOMY       Social History:   reports that he has quit smoking. His smoking use included cigarettes. He has a 15.00 pack-year smoking history. He has never used smokeless tobacco. He reports current alcohol use of about 1.0 - 2.0 standard drink of alcohol per week. He reports that he  does not use drugs.   Family History:  His family history includes Heart attack in his mother; Leukemia in his brother; Prostate cancer in his brother and father.   Allergies Allergies  Allergen Reactions   Ace Inhibitors Cough   Metoprolol Diarrhea    Patient has been tolerating Propranolol 40 mg twice daily since at least 05/2022.   Nsaids Rash   Sulfa Antibiotics Rash     Home Medications  Prior to Admission medications   Medication Sig Start Date End Date Taking? Authorizing Provider  albuterol (ACCUNEB) 1.25 MG/3ML nebulizer solution Take 3 mLs by nebulization every 6 (six) hours as needed for wheezing. 11/20/22  Yes Wieting, Richard, MD  ALPRAZolam Duanne Moron) 0.5 MG tablet Take 0.5 mg by mouth daily as needed for anxiety. 10/06/22 10/06/23 Yes [provider]  aspirin EC 81 MG tablet Take 81 mg by mouth daily. Swallow whole.   Yes [provider]  diltiazem (CARDIZEM CD) 240 MG 24 hr capsule Take 240 mg by mouth every morning. 12/07/21  Yes [provider]  donepezil (ARICEPT) 5 MG tablet Take 5 mg by mouth at bedtime. 05/10/22  Yes [provider]  fluticasone (FLONASE) 50 MCG/ACT nasal spray Place 1 spray into both nostrils daily as needed for allergies or rhinitis.   Yes [provider]  folic acid (FOLVITE) 1 MG tablet Take 1 tablet (1 mg total) by mouth daily. 03/01/22  Yes Wieting, Richard, MD  gabapentin (NEURONTIN) 300 MG capsule Take 1 capsule (300 mg total) by mouth at bedtime. 02/28/22  Yes Wieting, Richard, MD  melatonin 5 MG TABS Take 1 tablet (5 mg total) by mouth at bedtime. 02/28/22  Yes Loletha Grayer, MD  nitroGLYCERIN (  NITROSTAT) 0.4 MG SL tablet Place 0.4 mg under the tongue every 5 (five) minutes as needed for chest pain.   Yes [provider]  omeprazole (PRILOSEC) 40 MG capsule Take 40 mg by mouth daily. 04/20/22  Yes [provider]  ondansetron (ZOFRAN-ODT) 4 MG disintegrating tablet Take 1 tablet (4 mg  total) by mouth every 8 (eight) hours as needed for nausea or vomiting. 06/11/22  Yes Carrie Mew, MD  propranolol (INDERAL) 40 MG tablet Take 40 mg by mouth 2 (two) times daily. 11/07/22  Yes [provider]  QUEtiapine (SEROQUEL) 25 MG tablet One tab po qhs (can take extra dose if not sleeping) 02/28/22  Yes Wieting, Richard, MD  rosuvastatin (CRESTOR) 5 MG tablet Take 5 mg by mouth daily. 05/10/22  Yes [provider]  SYMBICORT 80-4.5 MCG/ACT inhaler Inhale 2 puffs into the lungs 2 (two) times daily as needed.  05/19/20  Yes [provider]  venlafaxine XR (EFFEXOR-XR) 37.5 MG 24 hr capsule Take 37.5 mg by mouth daily. 12/07/21  Yes [provider]  azithromycin (ZITHROMAX) 250 MG tablet One tab po daily for three days Patient not taking: Reported on 11/29/2022 11/21/22   Loletha Grayer, MD  guaiFENesin (ROBITUSSIN) 100 MG/5ML liquid Take 5 mLs by mouth every 4 (four) hours as needed for cough or to loosen phlegm. Patient not taking: Reported on 12/17/2022 02/28/22   Loletha Grayer, MD  polyethylene glycol (MIRALAX / GLYCOLAX) 17 g packet Take 17 g by mouth daily as needed for moderate constipation. Patient not taking: Reported on 11/08/2022 02/28/22   Loletha Grayer, MD     Scheduled Meds:  budesonide (PULMICORT) nebulizer solution  0.5 mg Nebulization BID   Chlorhexidine Gluconate Cloth  6 each Topical Daily   docusate  100 mg Per Tube BID   feeding supplement (PROSource TF20)  60 mL Per Tube Daily   folic acid  1 mg Per Tube Daily   free water  30 mL Per Tube Q4H   heparin injection (subcutaneous)  5,000 Units Subcutaneous Q8H   insulin aspart  0-20 Units Subcutaneous Q4H   ipratropium-albuterol  3 mL Nebulization Q4H   multivitamin with minerals  1 tablet Per Tube Daily   mouth rinse  15 mL Mouth Rinse Q2H   polyethylene glycol  17 g Per Tube Daily   propranolol  40 mg Per Tube BID   QUEtiapine  25 mg Per Tube QHS   rosuvastatin  5 mg Per  Tube Daily   thiamine (VITAMIN B1) injection  100 mg Intravenous Daily   venlafaxine  18.75 mg Per Tube BID WC   Continuous Infusions:  sodium chloride     famotidine (PEPCID) IV Stopped (12/28/22 0919)   feeding supplement (VITAL AF 1.2 CAL) 40 mL/hr at 12/29/22 0700   propofol (DIPRIVAN) infusion 40 mcg/kg/min (12/29/22 0700)   PRN Meds:.albuterol, dextromethorphan-guaiFENesin, fentaNYL (SUBLIMAZE) injection, ondansetron (ZOFRAN) IV, mouth rinse   Critical care time: 40 minutes    Darel Hong, AGACNP-BC [facility] Prefer epic messenger for cross cover needs If after hours, please call E-link

## 2022-12-29 NOTE — Progress Notes (Signed)
Eeg done 

## 2022-12-30 ENCOUNTER — Inpatient Hospital Stay: Payer: Medicare Other

## 2022-12-30 DIAGNOSIS — G9340 Encephalopathy, unspecified: Secondary | ICD-10-CM

## 2022-12-30 DIAGNOSIS — F10930 Alcohol use, unspecified with withdrawal, uncomplicated: Secondary | ICD-10-CM | POA: Diagnosis not present

## 2022-12-30 DIAGNOSIS — J9601 Acute respiratory failure with hypoxia: Secondary | ICD-10-CM

## 2022-12-30 DIAGNOSIS — R569 Unspecified convulsions: Secondary | ICD-10-CM | POA: Diagnosis not present

## 2022-12-30 LAB — CBC
HCT: 28.9 % — ABNORMAL LOW (ref 39.0–52.0)
Hemoglobin: 9.2 g/dL — ABNORMAL LOW (ref 13.0–17.0)
MCH: 31.8 pg (ref 26.0–34.0)
MCHC: 31.8 g/dL (ref 30.0–36.0)
MCV: 100 fL (ref 80.0–100.0)
Platelets: 210 10*3/uL (ref 150–400)
RBC: 2.89 MIL/uL — ABNORMAL LOW (ref 4.22–5.81)
RDW: 18.2 % — ABNORMAL HIGH (ref 11.5–15.5)
WBC: 8 10*3/uL (ref 4.0–10.5)
nRBC: 0.9 % — ABNORMAL HIGH (ref 0.0–0.2)

## 2022-12-30 LAB — GLUCOSE, CAPILLARY
Glucose-Capillary: 188 mg/dL — ABNORMAL HIGH (ref 70–99)
Glucose-Capillary: 194 mg/dL — ABNORMAL HIGH (ref 70–99)
Glucose-Capillary: 205 mg/dL — ABNORMAL HIGH (ref 70–99)
Glucose-Capillary: 216 mg/dL — ABNORMAL HIGH (ref 70–99)
Glucose-Capillary: 242 mg/dL — ABNORMAL HIGH (ref 70–99)
Glucose-Capillary: 247 mg/dL — ABNORMAL HIGH (ref 70–99)

## 2022-12-30 LAB — THYROID PANEL WITH TSH
Free Thyroxine Index: 1.4 (ref 1.2–4.9)
T3 Uptake Ratio: 38 % (ref 24–39)
T4, Total: 3.7 ug/dL — ABNORMAL LOW (ref 4.5–12.0)
TSH: 0.69 u[IU]/mL (ref 0.450–4.500)

## 2022-12-30 LAB — PHOSPHORUS: Phosphorus: 2.4 mg/dL — ABNORMAL LOW (ref 2.5–4.6)

## 2022-12-30 LAB — MAGNESIUM: Magnesium: 2.3 mg/dL (ref 1.7–2.4)

## 2022-12-30 LAB — BASIC METABOLIC PANEL
Anion gap: 9 (ref 5–15)
BUN: 69 mg/dL — ABNORMAL HIGH (ref 8–23)
CO2: 25 mmol/L (ref 22–32)
Calcium: 8.6 mg/dL — ABNORMAL LOW (ref 8.9–10.3)
Chloride: 104 mmol/L (ref 98–111)
Creatinine, Ser: 1.75 mg/dL — ABNORMAL HIGH (ref 0.61–1.24)
GFR, Estimated: 39 mL/min — ABNORMAL LOW (ref >60)
Glucose, Bld: 217 mg/dL — ABNORMAL HIGH (ref 70–99)
Potassium: 4.5 mmol/L (ref 3.5–5.1)
Sodium: 138 mmol/L (ref 135–145)

## 2022-12-30 LAB — TRIGLYCERIDES: Triglycerides: 210 mg/dL — ABNORMAL HIGH (ref <150)

## 2022-12-30 LAB — TROPONIN I (HIGH SENSITIVITY): Troponin I (High Sensitivity): 16 ng/L (ref <18)

## 2022-12-30 LAB — BRAIN NATRIURETIC PEPTIDE: B Natriuretic Peptide: 217.1 pg/mL — ABNORMAL HIGH (ref 0.0–100.0)

## 2022-12-30 LAB — PROCALCITONIN: Procalcitonin: 1.05 ng/mL

## 2022-12-30 MED ORDER — HYDRALAZINE HCL 20 MG/ML IJ SOLN
10.0000 mg | Freq: Four times a day (QID) | INTRAMUSCULAR | Status: DC | PRN
  Administered 2022-12-30 – 2023-01-03 (×5): 20 mg via INTRAVENOUS
  Administered 2023-01-08: 10 mg via INTRAVENOUS
  Filled 2022-12-30 (×7): qty 1

## 2022-12-30 MED ORDER — PROPRANOLOL HCL 20 MG PO TABS
20.0000 mg | ORAL_TABLET | Freq: Once | ORAL | Status: AC
  Administered 2022-12-30: 20 mg
  Filled 2022-12-30: qty 1

## 2022-12-30 MED ORDER — INSULIN ASPART 100 UNIT/ML IJ SOLN
3.0000 [IU] | INTRAMUSCULAR | Status: DC
  Administered 2022-12-30 – 2023-01-01 (×14): 3 [IU] via SUBCUTANEOUS
  Filled 2022-12-30 (×8): qty 1

## 2022-12-30 MED ORDER — POTASSIUM & SODIUM PHOSPHATES 280-160-250 MG PO PACK
2.0000 | PACK | ORAL | Status: AC
  Administered 2022-12-30 (×2): 2 via ORAL
  Filled 2022-12-30 (×2): qty 2

## 2022-12-30 MED ORDER — PROPRANOLOL HCL 20 MG PO TABS
60.0000 mg | ORAL_TABLET | Freq: Two times a day (BID) | ORAL | Status: DC
  Administered 2022-12-30 – 2023-01-01 (×4): 60 mg
  Filled 2022-12-30 (×4): qty 3

## 2022-12-30 MED ORDER — VITAMIN B-12 100 MCG PO TABS
100.0000 ug | ORAL_TABLET | Freq: Every day | ORAL | Status: DC
  Administered 2022-12-30 – 2023-01-08 (×9): 100 ug
  Filled 2022-12-30 (×6): qty 1

## 2022-12-30 MED ORDER — PIPERACILLIN-TAZOBACTAM 3.375 G IVPB
3.3750 g | Freq: Three times a day (TID) | INTRAVENOUS | Status: AC
  Administered 2022-12-30 – 2023-01-03 (×13): 3.375 g via INTRAVENOUS
  Filled 2022-12-30 (×13): qty 50

## 2022-12-30 NOTE — Progress Notes (Signed)
[facility]  ROUNDING NOTE   Subjective:   Patient seen and evaluated at bedside in ICU Sedation: weaned No response to name or pain Increased Vent 35% FiO2 1+ edema in thighs Tube feed at 60 mL/h  Creatinine 1.75 UOP 1L  Objective:  Vital signs in last 24 hours:  Temp:  [98.1 F (36.7 C)-100.9 F (38.3 C)] 100.9 F (38.3 C) (01/05 1100) Pulse Rate:  [96-124] 121 (01/05 1400) Resp:  [23-34] 34 (01/05 1400) BP: (139-205)/(65-90) 184/80 (01/05 1400) SpO2:  [94 %-99 %] 97 % (01/05 1400) FiO2 (%):  [35 %] 35 % (01/05 0821) Weight:  [86.2 kg] 86.2 kg (01/05 0447)  Weight change: 1.1 kg Filed Weights   12/28/22 0500 12/29/22 0500 12/30/22 0447  Weight: 84 kg 85.1 kg 86.2 kg    Intake/Output: I/O last 3 completed shifts: In: 2018 [I.V.:674.9; NG/GT:1343.2] Out: 1480 [Urine:1250; Emesis/NG output:30; Stool:200]   Intake/Output this shift:  Total I/O In: -  Out: 444 [Urine:400; Stool:44] Follow-up no Physical Exam: General: Ill appearing  Head: Normocephalic, atraumatic.   Eyes: Anicteric  Lungs:  Scattered rhonchi, vent assisted  Heart: S1S2 present, tachycardic  Abdomen:  Soft, nontender  Extremities: 1+ peripheral edema.  Neurologic: Sedated  Skin: No lesions  Access: None    Basic Metabolic Panel: Recent Labs  Lab 12/24/22 0406 12/25/22 0410 12/25/22 2159 12/26/22 0440 12/27/22 0416 12/28/22 0322 12/29/22 0204 12/30/22 0440  NA 138   < > 135 137 139 139 137 138  K 4.4   < > 4.9 4.6 4.5 4.7 4.2 4.5  CL 104   < > 104 104 104 107 104 104  CO2 23   < > 20* 21* 24 18* 20* 25  GLUCOSE 181*   < > 158* 119* 97 150* 244* 217*  BUN 18   < > 42* 46* 57* 64* 64* 69*  CREATININE 1.95*   < > 3.29* 3.15* 2.42* 2.07* 1.70* 1.75*  CALCIUM 8.2*   < > 8.1* 8.6* 8.5* 8.6* 8.6* 8.6*  MG 2.4  --  2.2  --   --  2.6* 2.3 2.3  PHOS 4.9*   < >  --  5.9* 4.7* 4.5 2.5 2.4*   < > = values in this interval not displayed.     Liver Function Tests: Recent  Labs  Lab 12/24/22 0406 12/25/22 0410 12/26/22 0440 12/27/22 0416 12/28/22 0322  ALBUMIN 2.7* 2.4* 2.6* 2.2* 2.1*    No results for input(s): "LIPASE", "AMYLASE" in the last 168 hours. Recent Labs  Lab 12/29/22 1113  AMMONIA 44*     CBC: Recent Labs  Lab 12/26/22 0440 12/27/22 0416 12/28/22 0322 12/29/22 0204 12/30/22 0440  WBC 6.3 5.3 4.7 4.9 8.0  HGB 11.4* 10.5* 9.8* 9.9* 9.2*  HCT 35.2* 33.2* 30.9* 30.2* 28.9*  MCV 103.2* 104.1* 102.7* 99.7 100.0  PLT 118* 139* 152 175 210     Cardiac Enzymes: Recent Labs  Lab 12/25/22 0409  CKTOTAL 81     BNP: Invalid input(s): "POCBNP"  CBG: Recent Labs  Lab 12/29/22 2038 12/29/22 2333 12/30/22 0355 12/30/22 0721 12/30/22 1125  GLUCAP 243* 239* 216* 194* 242*     Microbiology: Results for orders placed or performed during the hospital encounter of 12/12/2022  Resp Panel by RT-PCR (Flu A&B, Covid) Anterior Nasal Swab     Status: None   Collection Time: 12/08/2022  1:14 PM   Specimen: Anterior Nasal Swab  Result Value Ref Range Status   SARS Coronavirus  2 by RT PCR NEGATIVE NEGATIVE Final    Comment: (NOTE) SARS-CoV-2 target nucleic acids are NOT DETECTED.  The SARS-CoV-2 RNA is generally detectable in upper respiratory specimens during the acute phase of infection. The lowest concentration of SARS-CoV-2 viral copies this assay can detect is 138 copies/mL. A negative result does not preclude SARS-Cov-2 infection and should not be used as the sole basis for treatment or other patient management decisions. A negative result may occur with  improper specimen collection/handling, submission of specimen other than nasopharyngeal swab, presence of viral mutation(s) within the areas targeted by this assay, and inadequate number of viral copies(<138 copies/mL). A negative result must be combined with clinical observations, patient history, and epidemiological information. The expected result is Negative.  Fact  Sheet for Patients:  EntrepreneurPulse.com.au  Fact Sheet for Healthcare Providers:  IncredibleEmployment.be  This test is no t yet approved or cleared by the [place] FDA and  has been authorized for detection and/or diagnosis of SARS-CoV-2 by FDA under an Emergency Use Authorization (EUA). This EUA will remain  in effect (meaning this test can be used) for the duration of the COVID-19 declaration under Section 564(b)(1) of the Act, 21 U.S.C.section 360bbb-3(b)(1), unless the authorization is terminated  or revoked sooner.       Influenza A by PCR NEGATIVE NEGATIVE Final   Influenza B by PCR NEGATIVE NEGATIVE Final    Comment: (NOTE) The Xpert Xpress SARS-CoV-2/FLU/RSV plus assay is intended as an aid in the diagnosis of influenza from Nasopharyngeal swab specimens and should not be used as a sole basis for treatment. Nasal washings and aspirates are unacceptable for Xpert Xpress SARS-CoV-2/FLU/RSV testing.  Fact Sheet for Patients: EntrepreneurPulse.com.au  Fact Sheet for Healthcare Providers: IncredibleEmployment.be  This test is not yet approved or cleared by the [place] FDA and has been authorized for detection and/or diagnosis of SARS-CoV-2 by FDA under an Emergency Use Authorization (EUA). This EUA will remain in effect (meaning this test can be used) for the duration of the COVID-19 declaration under Section 564(b)(1) of the Act, 21 U.S.C. section 360bbb-3(b)(1), unless the authorization is terminated or revoked.  Performed at [facility], [address] [place], [place] [address]   MRSA Next Gen by PCR, Nasal     Status: None   Collection Time: 12/18/2022  8:43 PM   Specimen: Nasal Mucosa; Nasal Swab  Result Value Ref Range Status   MRSA by PCR Next Gen NOT DETECTED NOT DETECTED Final    Comment: (NOTE) The GeneXpert MRSA Assay (FDA approved for NASAL specimens  only), is one component of a comprehensive MRSA colonization surveillance program. It is not intended to diagnose MRSA infection nor to guide or monitor treatment for MRSA infections. Test performance is not FDA approved in patients less than 64 years old. Performed at [facility], [address] [place], [place] [address]     Coagulation Studies: No results for input(s): "LABPROT", "INR" in the last 72 hours.  Urinalysis: No results for input(s): "COLORURINE", "LABSPEC", "PHURINE", "GLUCOSEU", "HGBUR", "BILIRUBINUR", "KETONESUR", "PROTEINUR", "UROBILINOGEN", "NITRITE", "LEUKOCYTESUR" in the last 72 hours.  Invalid input(s): "APPERANCEUR"    Imaging: DG Chest Port 1 View  Result Date: 12/30/2022 CLINICAL DATA:  Acute respiratory failure EXAM: PORTABLE CHEST 1 VIEW COMPARISON:  Chest x-ray dated December 28, 2022 FINDINGS: Unchanged position of ETT and enteric tube. Patient is rotated to the left. Cardiac and mediastinal contours are within normal limits. New left retrocardiac consolidation. No large pleural effusion or  evidence of pneumothorax. IMPRESSION: New left retrocardiac consolidation, concerning for infection or left lower lobe collapse. Electronically Signed   By: Brett Blake M.D.   On: 12/30/2022 10:24   EEG adult  Result Date: 12/30/2022 Brett Jack, MD     12/30/2022  8:58 AM Routine EEG Report Brett Blake is a 82 y.o. male with a history of seizure who is undergoing an EEG to evaluate for seizures. Report: This EEG was acquired with electrodes placed according to the International 10-20 electrode system (including Fp1, Fp2, F3, F4, C3, C4, P3, P4, O1, O2, T3, T4, T5, T6, A1, A2, Fz, Cz, Pz). The following electrodes were missing or displaced: none. The best background was diffusely slow at 3-5 Hz with overriding beta frequencies. This activity is reactive to stimulation. No sleep architecture was identified. There was no focal slowing. There were no  definitive interictal epileptiform discharges. There were no electrographic seizures identified. Photic stimulation and hyperventilation were not performed. Impression and clinical correlation: This EEG was obtained while sedated on propofol and is abnormal due to: - severe diffuse slowing indicative of global cerebral dysfunction, medication effect, or both Brett Monks, MD [facility] [phone] If 7pm- 7am, please page neurology on call as listed in [place].   MR BRAIN WO CONTRAST  Result Date: 12/28/2022 CLINICAL DATA:  Mental status change of unknown cause.  Seizure. EXAM: MRI HEAD WITHOUT CONTRAST TECHNIQUE: Multiplanar, multiecho pulse sequences of the brain and surrounding structures were obtained without intravenous contrast. COMPARISON:  Head CT 12/20/2022 FINDINGS: Brain: Diffusion imaging does not show any acute or subacute infarction or other cause of restricted diffusion. There is generalized age related atrophy. No focal abnormality affects the brainstem or cerebellum. Cerebral hemispheres show only minimal changes of small vessel disease of the white matter, less than often seen at this age. No cortical or large vessel territory infarction. Chronic calcification in the posterolateral left thalamus which is nonprogressive. No evidence of mass lesion, hemorrhage, hydrocephalus or extra-axial collection. Vascular: Major vessels at the base of the brain show flow. Skull and upper cervical spine: Negative Sinuses/Orbits: Sinus mucosal thickening and layering fluid as often seen in patients with mechanical ventilation. Mastoid effusions as often seen in patients with mechanical ventilation. Orbits negative. Other: None IMPRESSION: 1. No acute or reversible finding. Generalized age related atrophy. Minimal small vessel change of the cerebral hemispheric white matter, less than often seen at this age. Chronic nonprogressive benign type calcification within the posterolateral left thalamus. 2.  Sinus mucosal thickening and layering fluid as often seen in patients with mechanical ventilation. Mastoid effusions as often seen in patients with mechanical ventilation. Electronically Signed   By: Brett Blake M.D.   On: 12/28/2022 14:34     Medications:    sodium chloride     feeding supplement (VITAL AF 1.2 CAL) 40 mL/hr at 12/30/22 0600   propofol (DIPRIVAN) infusion 35 mcg/kg/min (12/30/22 0600)    budesonide (PULMICORT) nebulizer solution  0.5 mg Nebulization BID   Chlorhexidine Gluconate Cloth  6 each Topical Daily   docusate  100 mg Per Tube BID   famotidine  20 mg Per Tube Daily   feeding supplement (PROSource TF20)  60 mL Per Tube Daily   folic acid  1 mg Per Tube Daily   free water  30 mL Per Tube Q4H   heparin injection (subcutaneous)  5,000 Units Subcutaneous Q8H   insulin aspart  0-20 Units Subcutaneous Q4H   insulin aspart  3 Units Subcutaneous  Q4H   ipratropium-albuterol  3 mL Nebulization Q4H   multivitamin with minerals  1 tablet Per Tube Daily   mouth rinse  15 mL Mouth Rinse Q2H   polyethylene glycol  17 g Per Tube Daily   potassium & sodium phosphates  2 packet Oral Q4H   propranolol  40 mg Per Tube BID   thiamine (VITAMIN B1) injection  100 mg Intravenous Daily   venlafaxine  18.75 mg Per Tube BID WC   vitamin B-12  100 mcg Per Tube Daily   albuterol, fentaNYL (SUBLIMAZE) injection, hydrALAZINE, mouth rinse  Assessment/ Plan:  [name] is a 82 y.o.  male with a PMHx of advanced COPD, asthma, diabetes mellitus type 2, GERD, hyperlipidemia, hypertension, obstructive sleep apnea, anxiety, who was admitted to [facility] on 12/24/2022 for evaluation of severe tremors.  Patient has history of alcohol intake nightly.  He developed significant delirium tremens upon admission and subsequently intubated for airway protection.    Acute kidney injury secondary to acute tubular necrosis from hypotension. Patient was on pressors earlier in the course of admission  but now weaned off. Renal ultrasound negative for obstruction.    Creatinine appears stable with increased urine output.  No acute need for dialysis at this time continue to avoid nephrotoxic agents if possible including hypotension.  Lab Results  Component Value Date   CREATININE 1.75 (H) 12/30/2022   CREATININE 1.70 (H) 12/29/2022   CREATININE 2.07 (H) 12/28/2022    Intake/Output Summary (Last 24 hours) at 12/30/2022 1404 Last data filed at 12/30/2022 1200 Gross per 24 hour  Intake 1266.57 ml  Output 1574 ml  Net -307.43 ml     2.  Acute respiratory failure.  Weaning trial failed yesterday due to agitation.  Vent slightly increased to 35% FiO2.   3.  Delirium tremors.  Sedation weaned, no response to verbal or painful stimuli.    LOS: 8   1/5/20242:04 PM

## 2022-12-30 NOTE — Progress Notes (Signed)
Pharmacy Antibiotic Note  Brett Blake is a 82 y.o. male admitted on 12/14/2022 with pneumonia.  Pharmacy has been consulted for Zosyn dosing.  Plan: Zosyn 3.375g IV q8h (4 hour infusion).  Height: '5\' 4"'$  (162.6 cm) Weight: 86.2 kg (190 lb 0.6 oz) IBW/kg (Calculated) : 59.2  Temp (24hrs), Avg:99.3 F (37.4 C), Min:98.1 F (36.7 C), Max:100.9 F (38.3 C)  Recent Labs  Lab 12/26/22 0440 12/27/22 0416 12/28/22 0322 12/29/22 0204 12/30/22 0440  WBC 6.3 5.3 4.7 4.9 8.0  CREATININE 3.15* 2.42* 2.07* 1.70* 1.75*    Estimated Creatinine Clearance: 33.3 mL/min (A) (by C-G formula based on SCr of 1.75 mg/dL (H)).    Allergies  Allergen Reactions   Ace Inhibitors Cough   Metoprolol Diarrhea    Patient has been tolerating Propranolol 40 mg twice daily since at least 05/2022.   Nsaids Rash   Sulfa Antibiotics Rash    Antimicrobials this admission: Zosyn 1/5 >>     Dose adjustments this admission:  Microbiology results:  Thank you for allowing pharmacy to be a part of this patient's care.  Paulina Fusi, PharmD, BCPS 12/30/2022 7:03 PM

## 2022-12-30 NOTE — Progress Notes (Signed)
Progress Note   Updated pts wife Hendryx Ricke at bedside regarding pts condition and current plan of care.  Donell Beers, [place] Pager [phone] (please enter 7 digits) PCCM Consult Pager [phone] (please enter 7 digits)

## 2022-12-30 NOTE — Progress Notes (Signed)
[place] for Electrolyte Monitoring and Replacement   Recent Labs: Potassium (mmol/L)  Date Value  12/30/2022 4.5   Magnesium (mg/dL)  Date Value  12/30/2022 2.3   Calcium (mg/dL)  Date Value  12/30/2022 8.6 (L)   Albumin (g/dL)  Date Value  12/28/2022 2.1 (L)   Phosphorus (mg/dL)  Date Value  12/30/2022 2.4 (L)   Sodium (mmol/L)  Date Value  12/30/2022 138  Corrected Ca: 9.9 mg/dL  Assessment  Brett Blake is a 82 y.o. male presenting with seizure. PMH significant for AUD, HTN, HLD, DM, COPD, dementia, depression with anxiety, pseudoseizure, former smoker. Pharmacy has been consulted to monitor and replace electrolytes.  Diet: TF @ 30 mL/hr  Goal of Therapy: Electrolytes WNL  Plan:  Potassium: 4.2 >> 4.5, no replacement needed Phosphorus: 2.5 >> 2.4, give PhosNAK 2 packets per tube x2 Magnesium: 2.3 >> 2.3, stable Check BMP, Phos with AM labs  Thank you for allowing pharmacy to be a part of this patient's care.  Gretel Acre, PharmD PGY1 Pharmacy Resident 12/30/2022 7:18 AM

## 2022-12-30 NOTE — Plan of Care (Signed)
0000: pt only with 161m total UOP for shift, bladder scan performed 724 noted.  0100: in&out cath performed, total of 9080mof urine output, NP notified, will bladder scan again at 0600

## 2022-12-30 NOTE — Plan of Care (Signed)
  Problem: Education: Goal: Ability to describe self-care measures that may prevent or decrease complications (Diabetes Survival Skills Education) will improve Outcome: Not Progressing Goal: Individualized Educational Video(s) Outcome: Not Progressing   Problem: Coping: Goal: Ability to adjust to condition or change in health will improve Outcome: Not Progressing   Problem: Fluid Volume: Goal: Ability to maintain a balanced intake and output will improve Outcome: Not Progressing   Problem: Health Behavior/Discharge Planning: Goal: Ability to identify and utilize available resources and services will improve Outcome: Not Progressing Goal: Ability to manage health-related needs will improve Outcome: Not Progressing   Problem: Metabolic: Goal: Ability to maintain appropriate glucose levels will improve Outcome: Not Progressing   Problem: Nutritional: Goal: Maintenance of adequate nutrition will improve Outcome: Not Progressing Goal: Progress toward achieving an optimal weight will improve Outcome: Not Progressing   Problem: Skin Integrity: Goal: Risk for impaired skin integrity will decrease Outcome: Not Progressing   Problem: Tissue Perfusion: Goal: Adequacy of tissue perfusion will improve Outcome: Not Progressing   Problem: Education: Goal: Knowledge of General Education information will improve Description: Including pain rating scale, medication(s)/side effects and non-pharmacologic comfort measures Outcome: Not Progressing   Problem: Health Behavior/Discharge Planning: Goal: Ability to manage health-related needs will improve Outcome: Not Progressing   Problem: Clinical Measurements: Goal: Ability to maintain clinical measurements within normal limits will improve Outcome: Not Progressing Goal: Will remain free from infection Outcome: Not Progressing Goal: Diagnostic test results will improve Outcome: Not Progressing Goal: Respiratory complications will  improve Outcome: Not Progressing Goal: Cardiovascular complication will be avoided Outcome: Not Progressing   Problem: Activity: Goal: Risk for activity intolerance will decrease Outcome: Not Progressing   Problem: Nutrition: Goal: Adequate nutrition will be maintained Outcome: Not Progressing   Problem: Coping: Goal: Level of anxiety will decrease Outcome: Not Progressing   Problem: Elimination: Goal: Will not experience complications related to bowel motility Outcome: Not Progressing Goal: Will not experience complications related to urinary retention Outcome: Not Progressing   Problem: Pain Managment: Goal: General experience of comfort will improve Outcome: Not Progressing   Problem: Safety: Goal: Ability to remain free from injury will improve Outcome: Not Progressing   Problem: Skin Integrity: Goal: Risk for impaired skin integrity will decrease Outcome: Not Progressing Sedated/intubated.

## 2022-12-30 NOTE — Progress Notes (Signed)
NAME:  Brett Blake, MRN:  [id number], DOB:  1941/11/23, LOS: 8 ADMISSION DATE:  11/28/2022, CONSULTATION DATE: 11/27/2022 REFERRING MD: Dr. Blaine Hamper, CHIEF COMPLAINT: AMS   History of Present Illness:  This is an 82 yo male with hx of ETOH Abuse who presented to [facility] ER on 12/28 via EMS from home following a possible seizure activity the morning of 12/28.  He has a hx of non epileptic seizures, however he does not take antiepileptics.  He reported he has not had a seizure in a long time.  All hx obtained from pts chart and daughter.   ED Course Upon arrival to the ER pt noted to have severe tremors.  Pt reported he drinks 1 small alcoholic beverage every night prior to going to bed.  However, pts wife reported he drank hard liquor heavily over the past several days and abruptly stopped drinking 2 days ago.  He also complained of an upset stomach.  ER lab results revealed K+ 3.1/glucose 138/calcium 8.3/albumin 3.4/AST 81/ALT 62/total protein 6.4/magnesium 1.6/wbc 3.9/hgb 12.1/platelets 125.  COVID-19/Influenza A&B negative.  Pt had a CIWA score of 4, he received 1 mg of iv ativan.  Hospitalist initially contacted for hospital admission.  However, pt became increasingly agitated and combative despite receiving a total of 3 mg of iv ativan and 3 mg of IM haldol.  Security called to bedside.  PCCM team consulted due to pt requiring precedex gtt.  However, despite precedex gtt at maximal dose; additional 2 mg of iv ativan, 12.5 mg of iv fentanyl, and 2.5 mg of iv haldol pt remained combative/agitated requiring mechanical intubation.  Pt admitted to ICU per PCCM team for additional workup and treatment.    CT Head 12/28:  No evidence of acute intracranial abnormality. Mild chronic small vessel ischemic disease. CXR 12/28: No active cardiopulmonary disease.   Please see "significant hospital events" section below for full detailed hospital course.  Pertinent  Medical History  Anxiety  Arthritis  Asthma   Colon Polyps  COPD Depression  Type II Diabetes Mellitus Difficult Intubation  GERD  Hypercholesteremia  HLD HTN  Hyponatremia  OSA   Micro Data:  12/28: SARS-CoV-2 and influenza PCR>> negative 12/28: MRSA PCR>> negative  Antimicrobials:   Anti-infectives (From admission, onward)    None       Significant Hospital Events: Including procedures, antibiotic start and stop dates in addition to other pertinent events   12/28: Pt admitted with possible seizure activity suspect secondary to severe ETOH withdrawal requiring mechanical intubation  12/28: CT Head>No evidence of acute intracranial abnormality. Mild chronic small vessel ischemic disease.  12/23/22- patient still having seizures despite propofol, the seizures do respond to Ativan.  Pharmacy is monitoring electrolytes.  He is weaning off levophed.  He will start feeds today.  ABG with res alkalosis and RR was reduced.  12/24/22- patient improved, dcd phenobarb and wake up assessment was very poor today with onset of tachyarrythmia in 140s and seizure like activity upon reduction of sedation.  12/25/22-  patient is critically ill, he is not tolerating reduction of sedation with emergence of seizure like activity and tachyarrythmia. Wife and nephew at bedside we discussed short term plan. Today we have transitioned to herparin from lovenox due to worsening renal impairment.  12/26/22- patient was not able to tolerate reduction in sedation with devt of severe hypertension and tachyarrythmia 12/27/22:  AKI slowly improving.  Start Precedex to assist with WUA. 12/28/22: MR Brain WO Contrast>no acute or reversible finding.  Generalized age  related atrophy. Minimal small vessel change of the cerebral hemispheric white matter, less than often seen at this age. Chronic nonprogressive benign type  calcification within the posterolateral left thalamus. Sinus mucosal thickening and  layering fluid as often seen in patients with mechanical  ventilation. Mastoid effusions as often seen in patients with mechanical ventilation.  12/29/22: Pt failed WUA  12/30/22: EEG negative for electrographic seizures, however severe diffuse slowing indicative of global cerebral dysfunction, medication effect, or both  12/30/22: No acute events overnight. Pt remains mechanically intubated FiO2 35%. Will perform WUA and possible SBT   Interim History / Subjective:  As outlined above   Objective   Blood pressure (!) 141/66, pulse (!) 103, temperature 98.1 F (36.7 C), temperature source Axillary, resp. rate (!) 29, height '5\' 4"'$  (1.626 m), weight 86.2 kg, SpO2 94 %.    Vent Mode: PRVC FiO2 (%):  [28 %-35 %] 35 % Set Rate:  [10 bmp-16 bmp] 16 bmp Vt Set:  [400 mL] 400 mL PEEP:  [5 cmH20] 5 cmH20 Pressure Support:  [10 cmH20] 10 cmH20 Plateau Pressure:  [17 cmH20-28 cmH20] 19 cmH20   Intake/Output Summary (Last 24 hours) at 12/30/2022 0804 Last data filed at 12/30/2022 0600 Gross per 24 hour  Intake 1371.61 ml  Output 1130 ml  Net 241.61 ml   Filed Weights   12/28/22 0500 12/29/22 0500 12/30/22 0447  Weight: 84 kg 85.1 kg 86.2 kg    Examination: General: Acutely on chronicalyy-ill appearing male, mechanically ventilated, synchronous with the vent.  Sedated. HENT: Supple, difficult to assess for JVD due to body habitus, orally intubated Lungs: Clear diminished throughout, no wheezing or rhonchi noted, even, non labored, synchronous with the vent Cardiovascular: Sinus tach, s1s2, no r/g, 2+ radial/1+ distal, 1+ generalized edema  Abdomen: +BS x4, obese, soft, non distended  Extremities: Normal bulk and tone, no deformities Neuro: Sedated, not following commands, PERRL  GU: Indwelling foley catheter draining yellow urine  Resolved Hospital Problem list     Assessment & Plan:   Intubation and mechanical ventilation for airway protection in the setting of ETOH withdrawal  Hx: OSA, COPD, Asthma, and Difficult Intubation  - Full vent support,  implement lung protective strategies - Plateau pressures less than 30 cm H20 - Wean FiO2 & PEEP as tolerated to maintain O2 sats 88 to 92% - Follow intermittent Chest X-ray & ABG as needed - Spontaneous Breathing Trials when respiratory parameters met and mental status permits - Continue VAP Bundle - Bronchodilators & Pulmicort nebs  Hypertension Tachycardia, due to DT's Hx: Hypercholesteremia  - Continuous cardiac monitoring - Continue propranolol and prn hydralazine for bp management  - Continue outpatient rosuvastatin  - Echo and BNP pending   Acute kidney injury secondary to ATN  - Renal US negative - Trend BMP  - Replace electrolytes as indicated  - Monitor UOP  - Avoid nephrotoxic medications - Nephrology following, appreciate input ~ no acute need for dialysis  Fever - Trend WBC and monitor fever curve  - Trend PCT: if elevated will start abx therapy - Repeat CXR  - Will check respiratory cultures   Type II diabetes mellitus  - CBG's q4h; Target range of 140 to 180 - SSI and scheduled novolog  - Follow ICU Hypo/Hyperglycemia protocol  Possible seizure activity in the setting of ETOH withdrawal  Delirium Tremens Sedation needs in setting of mechanical ventilation CT Head on admission: negative for acute intracranial abnormality EEG 12/23/22: suggestive of severe to  profound diffuse encephalopathy, nonspecific etiology but likely related to sedation. No seizures or epileptiform discharges were seen throughout the recording.  MRI 1/3: with no acute or reversible finding. Chronic age related changes EEG 1/5: negative for seizure activity  - Maintain a RASS goal of 0  - Propofol gtt and prn fentanyl to maintain RASS goal - Avoid sedating medications as able - Daily wake up assessment  - Seizure precautions  - CIWA protocol - Continue thiamine, folic acid, and MVI - Low threshold for Neurology consultation  Best Practice (right click and "Reselect all SmartList  Selections" daily)   Diet/type: NPO, tube feeds DVT prophylaxis: Heparin SQ GI prophylaxis: H2B Lines: N/A Foley:  Yes, and it is still needed Code Status:  full code Last date of multidisciplinary goals of care discussion [12/30/22]  12/30/22: Will update family when they arrive at bedside  Labs   CBC: Recent Labs  Lab 12/26/22 0440 12/27/22 0416 12/28/22 0322 12/29/22 0204 12/30/22 0440  WBC 6.3 5.3 4.7 4.9 8.0  HGB 11.4* 10.5* 9.8* 9.9* 9.2*  HCT 35.2* 33.2* 30.9* 30.2* 28.9*  MCV 103.2* 104.1* 102.7* 99.7 100.0  PLT 118* 139* 152 175 502    Basic Metabolic Panel: Recent Labs  Lab 12/24/22 0406 12/25/22 0410 12/25/22 2159 12/26/22 0440 12/27/22 0416 12/28/22 0322 12/29/22 0204 12/30/22 0440  NA 138   < > 135 137 139 139 137 138  K 4.4   < > 4.9 4.6 4.5 4.7 4.2 4.5  CL 104   < > 104 104 104 107 104 104  CO2 23   < > 20* 21* 24 18* 20* 25  GLUCOSE 181*   < > 158* 119* 97 150* 244* 217*  BUN 18   < > 42* 46* 57* 64* 64* 69*  CREATININE 1.95*   < > 3.29* 3.15* 2.42* 2.07* 1.70* 1.75*  CALCIUM 8.2*   < > 8.1* 8.6* 8.5* 8.6* 8.6* 8.6*  MG 2.4  --  2.2  --   --  2.6* 2.3 2.3  PHOS 4.9*   < >  --  5.9* 4.7* 4.5 2.5 2.4*   < > = values in this interval not displayed.   GFR: Estimated Creatinine Clearance: 33.3 mL/min (A) (by C-G formula based on SCr of 1.75 mg/dL (H)). Recent Labs  Lab 12/27/22 0416 12/28/22 0322 12/29/22 0204 12/30/22 0440  WBC 5.3 4.7 4.9 8.0    Liver Function Tests: Recent Labs  Lab 12/24/22 0406 12/25/22 0410 12/26/22 0440 12/27/22 0416 12/28/22 0322  ALBUMIN 2.7* 2.4* 2.6* 2.2* 2.1*   No results for input(s): "LIPASE", "AMYLASE" in the last 168 hours. Recent Labs  Lab 12/29/22 1113  AMMONIA 44*    ABG    Component Value Date/Time   PHART 7.41 12/23/2022 0423   PCO2ART 46 12/23/2022 0423   PO2ART 81 (L) 12/23/2022 0423   HCO3 19.9 (L) 12/29/2022 0206   ACIDBASEDEF 3.3 (H) 12/29/2022 0206   O2SAT 92.8 12/29/2022 0206      Coagulation Profile: No results for input(s): "INR", "PROTIME" in the last 168 hours.  Cardiac Enzymes: Recent Labs  Lab 12/25/22 0409  CKTOTAL 81    HbA1C: Hgb A1c MFr Bld  Date/Time Value Ref Range Status  11/20/2022 05:04 AM 7.4 (H) 4.8 - 5.6 % Final    Comment:    (NOTE)         Prediabetes: 5.7 - 6.4         Diabetes: >6.4  Glycemic control for adults with diabetes: <7.0   02/15/2022 05:57 AM 5.9 (H) 4.8 - 5.6 % Final    Comment:    (NOTE)         Prediabetes: 5.7 - 6.4         Diabetes: >6.4         Glycemic control for adults with diabetes: <7.0     CBG: Recent Labs  Lab 12/29/22 1917 12/29/22 2038 12/29/22 2333 12/30/22 0355 12/30/22 0721  GLUCAP 264* 243* 239* 216* 194*    Review of Systems:   Unable to assess pt mechanically intubated   Past Medical History:  He,  has a past medical history of Anxiety, Arthritis, Asthma, Colon polyps, COPD (chronic obstructive pulmonary disease) ([place]), Depression, Diabetes ([place]), Difficult intubation, GERD (gastroesophageal reflux disease), High cholesterol, Hyperlipidemia, Hypertension, Hyponatremia (02/23/2022), and Sleep apnea.   Surgical History:   Past Surgical History:  Procedure Laterality Date   BRONCHOSCOPY     CARDIAC CATHETERIZATION Left 11/04/2016   Procedure: Left Heart Cath and Coronary Angiography;  Surgeon: Teodoro Spray, MD;  Location: [place] [facility];  Service: Cardiovascular;  Laterality: Left;   CARDIAC CATHETERIZATION     CHOLECYSTECTOMY N/A 05/09/2019   Procedure: LAPAROSCOPIC CHOLECYSTECTOMY WITH INTRAOPERATIVE CHOLANGIOGRAM;  Surgeon: Jules Husbands, MD;  Location: [facility];  Service: General;  Laterality: N/A;   COLOSTOMY     IR KYPHO LUMBAR INC FX REDUCE BONE BX UNI/BIL CANNULATION INC/IMAGING  11/10/2022   IR RADIOLOGIST EVAL & MGMT  11/08/2022   LEFT HEART CATH AND CORONARY ANGIOGRAPHY N/A 12/14/2021   Procedure: LEFT HEART CATH AND CORONARY ANGIOGRAPHY;  Surgeon:  Corey Skains, MD;  Location: [place] [facility];  Service: Cardiovascular;  Laterality: N/A;   ROTATOR CUFF REPAIR Left    THUMB ARTHROSCOPY  2015   TONSILLECTOMY  1952   TONSILLECTOMY       Social History:   reports that he has quit smoking. His smoking use included cigarettes. He has a 15.00 pack-year smoking history. He has never used smokeless tobacco. He reports current alcohol use of about 1.0 - 2.0 standard drink of alcohol per week. He reports that he does not use drugs.   Family History:  His family history includes Heart attack in his mother; Leukemia in his brother; Prostate cancer in his brother and father.   Allergies Allergies  Allergen Reactions   Ace Inhibitors Cough   Metoprolol Diarrhea    Patient has been tolerating Propranolol 40 mg twice daily since at least 05/2022.   Nsaids Rash   Sulfa Antibiotics Rash     Home Medications  Prior to Admission medications   Medication Sig Start Date End Date Taking? Authorizing Provider  albuterol (ACCUNEB) 1.25 MG/3ML nebulizer solution Take 3 mLs by nebulization every 6 (six) hours as needed for wheezing. 11/20/22  Yes Wieting, Richard, MD  ALPRAZolam Duanne Moron) 0.5 MG tablet Take 0.5 mg by mouth daily as needed for anxiety. 10/06/22 10/06/23 Yes [provider]  aspirin EC 81 MG tablet Take 81 mg by mouth daily. Swallow whole.   Yes [provider]  diltiazem (CARDIZEM CD) 240 MG 24 hr capsule Take 240 mg by mouth every morning. 12/07/21  Yes [provider]  donepezil (ARICEPT) 5 MG tablet Take 5 mg by mouth at bedtime. 05/10/22  Yes [provider]  fluticasone (FLONASE) 50 MCG/ACT nasal spray Place 1 spray into both nostrils daily as needed for allergies or rhinitis.   Yes [provider]  folic acid (FOLVITE) 1 MG tablet Take 1 tablet (1 mg total) by mouth daily. 03/01/22  Yes Wieting, Richard, MD  gabapentin (NEURONTIN) 300 MG capsule Take 1 capsule (300 mg total) by mouth  at bedtime. 02/28/22  Yes Wieting, Richard, MD  melatonin 5 MG TABS Take 1 tablet (5 mg total) by mouth at bedtime. 02/28/22  Yes Wieting, Richard, MD  nitroGLYCERIN (NITROSTAT) 0.4 MG SL tablet Place 0.4 mg under the tongue every 5 (five) minutes as needed for chest pain.   Yes [provider]  omeprazole (PRILOSEC) 40 MG capsule Take 40 mg by mouth daily. 04/20/22  Yes [provider]  ondansetron (ZOFRAN-ODT) 4 MG disintegrating tablet Take 1 tablet (4 mg total) by mouth every 8 (eight) hours as needed for nausea or vomiting. 06/11/22  Yes Carrie Mew, MD  propranolol (INDERAL) 40 MG tablet Take 40 mg by mouth 2 (two) times daily. 11/07/22  Yes [provider]  QUEtiapine (SEROQUEL) 25 MG tablet One tab po qhs (can take extra dose if not sleeping) 02/28/22  Yes Wieting, Richard, MD  rosuvastatin (CRESTOR) 5 MG tablet Take 5 mg by mouth daily. 05/10/22  Yes [provider]  SYMBICORT 80-4.5 MCG/ACT inhaler Inhale 2 puffs into the lungs 2 (two) times daily as needed.  05/19/20  Yes [provider]  venlafaxine XR (EFFEXOR-XR) 37.5 MG 24 hr capsule Take 37.5 mg by mouth daily. 12/07/21  Yes [provider]  azithromycin (ZITHROMAX) 250 MG tablet One tab po daily for three days Patient not taking: Reported on 12/19/2022 11/21/22   Loletha Grayer, MD  guaiFENesin (ROBITUSSIN) 100 MG/5ML liquid Take 5 mLs by mouth every 4 (four) hours as needed for cough or to loosen phlegm. Patient not taking: Reported on 11/27/2022 02/28/22   Loletha Grayer, MD  polyethylene glycol (MIRALAX / GLYCOLAX) 17 g packet Take 17 g by mouth daily as needed for moderate constipation. Patient not taking: Reported on 11/08/2022 02/28/22   Loletha Grayer, MD     Scheduled Meds:  budesonide (PULMICORT) nebulizer solution  0.5 mg Nebulization BID   Chlorhexidine Gluconate Cloth  6 each Topical Daily   docusate  100 mg Per Tube BID   famotidine  20 mg Per Tube Daily    feeding supplement (PROSource TF20)  60 mL Per Tube Daily   folic acid  1 mg Per Tube Daily   free water  30 mL Per Tube Q4H   heparin injection (subcutaneous)  5,000 Units Subcutaneous Q8H   insulin aspart  0-20 Units Subcutaneous Q4H   insulin aspart  3 Units Subcutaneous Q4H   ipratropium-albuterol  3 mL Nebulization Q4H   multivitamin with minerals  1 tablet Per Tube Daily   mouth rinse  15 mL Mouth Rinse Q2H   polyethylene glycol  17 g Per Tube Daily   propranolol  40 mg Per Tube BID   QUEtiapine  25 mg Per Tube QHS   rosuvastatin  5 mg Per Tube Daily   thiamine (VITAMIN B1) injection  100 mg Intravenous Daily   venlafaxine  18.75 mg Per Tube BID WC   Continuous Infusions:  sodium chloride     feeding supplement (VITAL AF 1.2 CAL) 40 mL/hr at 12/30/22 0600   propofol (DIPRIVAN) infusion 35 mcg/kg/min (12/30/22 0600)   PRN Meds:.albuterol, dextromethorphan-guaiFENesin, fentaNYL (SUBLIMAZE) injection, hydrALAZINE, ondansetron (ZOFRAN) IV, mouth rinse   Critical care time: 52 minutes    Donell Beers, [place] Pager [phone] (please  enter 7 digits) PCCM Consult Pager [phone] (please enter 7 digits)

## 2022-12-30 NOTE — Procedures (Addendum)
Routine EEG Report  Brett Blake is a 82 y.o. male with a history of seizure who is undergoing an EEG to evaluate for seizures.  Report: This EEG was acquired with electrodes placed according to the International 10-20 electrode system (including Fp1, Fp2, F3, F4, C3, C4, P3, P4, O1, O2, T3, T4, T5, T6, A1, A2, Fz, Cz, Pz). The following electrodes were missing or displaced: none.  The best background was diffusely slow at 3-5 Hz with overriding beta frequencies. This activity is reactive to stimulation. No sleep architecture was identified. There was no focal slowing. There were no definitive interictal epileptiform discharges. There were no electrographic seizures identified. Photic stimulation and hyperventilation were not performed.   Impression and clinical correlation: This EEG was obtained while sedated on propofol and is abnormal due to: - severe diffuse slowing indicative of global cerebral dysfunction, medication effect, or both   Su Monks, MD [facility] [phone]  If 7pm- 7am, please page neurology on call as listed in [place].

## 2022-12-31 ENCOUNTER — Inpatient Hospital Stay
Admit: 2022-12-31 | Discharge: 2022-12-31 | Disposition: A | Discharge status: Home / Self Care | Payer: Medicare Other | Attending: Critical Care Medicine | Admitting: Critical Care Medicine

## 2022-12-31 ENCOUNTER — Inpatient Hospital Stay: Payer: Medicare Other

## 2022-12-31 DIAGNOSIS — E119 Type 2 diabetes mellitus without complications: Secondary | ICD-10-CM | POA: Diagnosis not present

## 2022-12-31 DIAGNOSIS — G928 Other toxic encephalopathy: Secondary | ICD-10-CM | POA: Diagnosis not present

## 2022-12-31 DIAGNOSIS — F10931 Alcohol use, unspecified with withdrawal delirium: Secondary | ICD-10-CM

## 2022-12-31 DIAGNOSIS — J9601 Acute respiratory failure with hypoxia: Secondary | ICD-10-CM | POA: Diagnosis not present

## 2022-12-31 DIAGNOSIS — N179 Acute kidney failure, unspecified: Secondary | ICD-10-CM | POA: Diagnosis not present

## 2022-12-31 LAB — BASIC METABOLIC PANEL
Anion gap: 12 (ref 5–15)
BUN: 79 mg/dL — ABNORMAL HIGH (ref 8–23)
CO2: 26 mmol/L (ref 22–32)
Calcium: 8.8 mg/dL — ABNORMAL LOW (ref 8.9–10.3)
Chloride: 104 mmol/L (ref 98–111)
Creatinine, Ser: 1.96 mg/dL — ABNORMAL HIGH (ref 0.61–1.24)
GFR, Estimated: 34 mL/min — ABNORMAL LOW (ref >60)
Glucose, Bld: 226 mg/dL — ABNORMAL HIGH (ref 70–99)
Potassium: 4.9 mmol/L (ref 3.5–5.1)
Sodium: 142 mmol/L (ref 135–145)

## 2022-12-31 LAB — BLOOD GAS, ARTERIAL
Acid-Base Excess: 5.6 mmol/L — ABNORMAL HIGH (ref 0.0–2.0)
Bicarbonate: 30.6 mmol/L — ABNORMAL HIGH (ref 20.0–28.0)
FIO2: 40 %
O2 Saturation: 94.2 %
PEEP: 5 cmH2O
Patient temperature: 37
RATE: 16 resp/min
Spontaneous VT: 400 mL
pCO2 arterial: 45 mmHg (ref 32–48)
pH, Arterial: 7.44 (ref 7.35–7.45)
pO2, Arterial: 64 mmHg — ABNORMAL LOW (ref 83–108)

## 2022-12-31 LAB — ECHOCARDIOGRAM COMPLETE
AR max vel: 2 cm2
AV Peak grad: 9.9 mmHg
Ao pk vel: 1.57 m/s
Area-P 1/2: 5.09 cm2
Height: 64 in
S' Lateral: 2.5 cm
Single Plane A4C EF: 60.3 %
Weight: 3030 oz

## 2022-12-31 LAB — CBC
HCT: 30.9 % — ABNORMAL LOW (ref 39.0–52.0)
Hemoglobin: 10.1 g/dL — ABNORMAL LOW (ref 13.0–17.0)
MCH: 32.5 pg (ref 26.0–34.0)
MCHC: 32.7 g/dL (ref 30.0–36.0)
MCV: 99.4 fL (ref 80.0–100.0)
Platelets: 290 10*3/uL (ref 150–400)
RBC: 3.11 MIL/uL — ABNORMAL LOW (ref 4.22–5.81)
RDW: 19.3 % — ABNORMAL HIGH (ref 11.5–15.5)
WBC: 13.5 10*3/uL — ABNORMAL HIGH (ref 4.0–10.5)
nRBC: 1.2 % — ABNORMAL HIGH (ref 0.0–0.2)

## 2022-12-31 LAB — PHOSPHORUS: Phosphorus: 2.8 mg/dL (ref 2.5–4.6)

## 2022-12-31 LAB — GLUCOSE, CAPILLARY
Glucose-Capillary: 199 mg/dL — ABNORMAL HIGH (ref 70–99)
Glucose-Capillary: 238 mg/dL — ABNORMAL HIGH (ref 70–99)
Glucose-Capillary: 219 mg/dL — ABNORMAL HIGH (ref 70–99)
Glucose-Capillary: 186 mg/dL — ABNORMAL HIGH (ref 70–99)
Glucose-Capillary: 135 mg/dL — ABNORMAL HIGH (ref 70–99)

## 2022-12-31 LAB — PROCALCITONIN: Procalcitonin: 1.63 ng/mL

## 2022-12-31 LAB — MRSA NEXT GEN BY PCR, NASAL: MRSA by PCR Next Gen: NOT DETECTED

## 2022-12-31 LAB — MAGNESIUM: Magnesium: 2.3 mg/dL (ref 1.7–2.4)

## 2022-12-31 MED ORDER — PANTOPRAZOLE SODIUM 40 MG IV SOLR
40.0000 mg | Freq: Every day | INTRAVENOUS | Status: DC
  Administered 2022-12-31 – 2023-01-08 (×9): 40 mg via INTRAVENOUS
  Filled 2022-12-31 (×9): qty 10

## 2022-12-31 NOTE — Progress Notes (Signed)
  Echocardiogram 2D Echocardiogram has been performed.  Brett Blake 12/31/2022, 11:55 AM

## 2022-12-31 NOTE — Progress Notes (Signed)
NAME:  Brett Blake, MRN:  [id number], DOB:  1941-12-05, LOS: 9 ADMISSION DATE:  11/25/2022, CHIEF COMPLAINT:  AMS   History of Present Illness:  This is an 82 yo male with hx of ETOH Abuse who presented to [facility] ER on 12/28 via EMS from home following a possible seizure activity the morning of 12/28.  He has a hx of non epileptic seizures, however he does not take antiepileptics.  He reported he has not had a seizure in a long time.  All hx obtained from pts chart and daughter.    ED Course Upon arrival to the ER pt noted to have severe tremors.  Pt reported he drinks 1 small alcoholic beverage every night prior to going to bed.  However, pts wife reported he drank hard liquor heavily over the past several days and abruptly stopped drinking 2 days ago.  He also complained of an upset stomach.  ER lab results revealed K+ 3.1/glucose 138/calcium 8.3/albumin 3.4/AST 81/ALT 62/total protein 6.4/magnesium 1.6/wbc 3.9/hgb 12.1/platelets 125.  COVID-19/Influenza A&B negative.  Pt had a CIWA score of 4, he received 1 mg of iv ativan.  Hospitalist initially contacted for hospital admission.  However, pt became increasingly agitated and combative despite receiving a total of 3 mg of iv ativan and 3 mg of IM haldol.  Security called to bedside.  PCCM team consulted due to pt requiring precedex gtt.  However, despite precedex gtt at maximal dose; additional 2 mg of iv ativan, 12.5 mg of iv fentanyl, and 2.5 mg of iv haldol pt remained combative/agitated requiring mechanical intubation.  Pt admitted to ICU per PCCM team for additional workup and treatment.     CT Head 12/28:  No evidence of acute intracranial abnormality. Mild chronic small vessel ischemic disease. CXR 12/28: No active cardiopulmonary disease.    Please see "significant hospital events" section below for full detailed hospital course.  Pertinent  Medical History  Anxiety  Arthritis  Asthma  Colon Polyps  COPD Depression  Type II  Diabetes Mellitus Difficult Intubation  GERD  Hypercholesteremia  HLD HTN  Hyponatremia  OSA  Significant Hospital Events: Including procedures, antibiotic start and stop dates in addition to other pertinent events   12/28: Pt admitted with possible seizure activity suspect secondary to severe ETOH withdrawal requiring mechanical intubation  12/28: CT Head>No evidence of acute intracranial abnormality. Mild chronic small vessel ischemic disease.  12/23/22- patient still having seizures despite propofol, the seizures do respond to Ativan.  Pharmacy is monitoring electrolytes.  He is weaning off levophed.  He will start feeds today.  ABG with res alkalosis and RR was reduced.  12/24/22- patient improved, dcd phenobarb and wake up assessment was very poor today with onset of tachyarrythmia in 140s and seizure like activity upon reduction of sedation.  12/25/22-  patient is critically ill, he is not tolerating reduction of sedation with emergence of seizure like activity and tachyarrythmia. Wife and nephew at bedside we discussed short term plan. Today we have transitioned to herparin from lovenox due to worsening renal impairment.  12/26/22- patient was not able to tolerate reduction in sedation with devt of severe hypertension and tachyarrythmia 12/27/22:  AKI slowly improving.  Start Precedex to assist with WUA. 12/28/22: MR Brain WO Contrast  12/29/22: Pt failed WUA  12/30/22: EEG negative for electrographic seizures, however severe diffuse slowing indicative of global cerebral dysfunction, medication effect, or both  12/30/22: No acute events overnight. Pt remains mechanically intubated FiO2 35%. Will perform  WUA and possible SBT   Interim History / Subjective:  Patient unresponsive, overbreathing the vent  Objective   Blood pressure (!) 150/71, pulse (!) 108, temperature 99.4 F (37.4 C), resp. rate (!) 36, height '5\' 4"'$  (1.626 m), weight 85.9 kg, SpO2 92 %.    Vent Mode: PRVC FiO2 (%):  [35 %-40  %] 40 % Set Rate:  [16 bmp] 16 bmp Vt Set:  [400 mL] 400 mL PEEP:  [5 cmH20-8 cmH20] 5 cmH20 Pressure Support:  [14 cmH20] 14 cmH20   Intake/Output Summary (Last 24 hours) at 12/31/2022 0925 Last data filed at 12/31/2022 0600 Gross per 24 hour  Intake 976.38 ml  Output 1345 ml  Net -368.62 ml   Filed Weights   12/29/22 0500 12/30/22 0447 12/31/22 0500  Weight: 85.1 kg 86.2 kg 85.9 kg    Examination: Physical Exam Constitutional:      General: He is not in acute distress.    Appearance: He is ill-appearing.  HENT:     Mouth/Throat:     Mouth: Mucous membranes are moist.  Eyes:     Extraocular Movements: Extraocular movements intact.     Pupils: Pupils are equal, round, and reactive to light.  Cardiovascular:     Rate and Rhythm: Regular rhythm. Tachycardia present.     Heart sounds: Normal heart sounds.  Pulmonary:     Comments: Ventilated breath sounds Abdominal:     General: There is distension.     Palpations: Abdomen is soft.  Neurological:     Mental Status: He is disoriented.     Comments: Does not follow commands. Does not withdraw to pain      Assessment & Plan:   Neurology #Toxic Metabolic Encephalopathy #EtOH withdrawal #Delirium Tremens  Initially intubated in the setting of severe agitation secondary to alcohol withdrawal/DT's. He's received phenobarbital/lorazepam for his withdrawals in addition to analgosedation given his intubation. While initially agitated, he's since been obtunded and unresponsive off all of sedation for the past 48 hours). Imaging performed did not show any acute findings to explain the symptoms, and EEG did not have signs of seizure. I suspect accumulation of toxic metabolites. While he has a history of drinking, there has been no finding to suggest cirrhosis (platelets, bilirubin, morphology on imagine). Low suspicion for meningitis/encephalitis at the moment.  -hold sedation -consider lactulose if no improvement in neurological  function -continue thiamine, folic acid and Vitamin B12 -d/c famotidine, switch to pantoprazole  Cardiovascular #HTN  Blood pressure is within control. Continue continuous cardiac monitoring. Goal MAP > 65 mmHg. He was started on propranolol as outpatient which is continued here.  -Hydralazine PRN for hypertension  Pulmonary #Acute Hypoxic Respiratory Failure #VAP  Patient intubated secondary to altered mental status and agitation. He's since developed increased secretions through the ETT with an associated left lower lung field infiltrate concerning for a ventilator associated pneumonia. Will start on broad spectrum antibiotics per ID section. He did tolerate PSV mode of ventilation yesterday but had to return to AC/VC with hypoxia.  -Full vent support, implement lung protective strategies -Plateau pressures less than 30 cm H20 -Wean FiO2 & PEEP as tolerated to maintain O2 sats >92% -Follow intermittent Chest X-ray & ABG as needed -Spontaneous Breathing Trials when respiratory parameters met and mental status permits -Implement VAP Bundle -Prn Bronchodilators  Gastrointestinal  Continue tube feeds, switch famotidine to pantoprazole for SUP. History of alcohol abuse but no findings to suggest cirrhosis (normal liver morphology on imaging, no thrombocytopenia, LFT's mildly  elevated but bilirubin within normal.  -check LFT tomorrow  Renal #AKI  Secondary to ATN, creatinine worse today. Nephrology consulted and following. Renal US unremarkable.  -avoid nephrotoxins -trend BMP and electrolytes, replete as necessary  Endocrine #T2DM  On insulin sliding scale, continue  Hem/Onc  Heparin subQ for prophylaxis. Monitor H/H  ID #HAP  Increased secretions from the ET tube. Respiratory Cultures pending. On Zosyn.   Best Practice (right click and "Reselect all SmartList Selections" daily)   Diet/type: tubefeeds DVT prophylaxis: prophylactic heparin  GI prophylaxis:  PPI Lines: N/A Foley:  Yes, and it is still needed Code Status:  full code Last date of multidisciplinary goals of care discussion [12/31/2022]  Labs   CBC: Recent Labs  Lab 12/27/22 0416 12/28/22 0322 12/29/22 0204 12/30/22 0440 12/31/22 0637  WBC 5.3 4.7 4.9 8.0 13.5*  HGB 10.5* 9.8* 9.9* 9.2* 10.1*  HCT 33.2* 30.9* 30.2* 28.9* 30.9*  MCV 104.1* 102.7* 99.7 100.0 99.4  PLT 139* 152 175 210 161    Basic Metabolic Panel: Recent Labs  Lab 12/25/22 2159 12/26/22 0440 12/27/22 0416 12/28/22 0322 12/29/22 0204 12/30/22 0440 12/31/22 0637  NA 135   < > 139 139 137 138 142  K 4.9   < > 4.5 4.7 4.2 4.5 4.9  CL 104   < > 104 107 104 104 104  CO2 20*   < > 24 18* 20* 25 26  GLUCOSE 158*   < > 97 150* 244* 217* 226*  BUN 42*   < > 57* 64* 64* 69* 79*  CREATININE 3.29*   < > 2.42* 2.07* 1.70* 1.75* 1.96*  CALCIUM 8.1*   < > 8.5* 8.6* 8.6* 8.6* 8.8*  MG 2.2  --   --  2.6* 2.3 2.3 2.3  PHOS  --    < > 4.7* 4.5 2.5 2.4* 2.8   < > = values in this interval not displayed.   GFR: Estimated Creatinine Clearance: 29.7 mL/min (A) (by C-G formula based on SCr of 1.96 mg/dL (H)). Recent Labs  Lab 12/28/22 0322 12/29/22 0204 12/30/22 0436 12/30/22 0440 12/31/22 0637  PROCALCITON  --   --  1.05  --  1.63  WBC 4.7 4.9  --  8.0 13.5*    Liver Function Tests: Recent Labs  Lab 12/25/22 0410 12/26/22 0440 12/27/22 0416 12/28/22 0322  ALBUMIN 2.4* 2.6* 2.2* 2.1*   No results for input(s): "LIPASE", "AMYLASE" in the last 168 hours. Recent Labs  Lab 12/29/22 1113  AMMONIA 44*    ABG    Component Value Date/Time   PHART 7.44 12/31/2022 0855   PCO2ART 45 12/31/2022 0855   PO2ART 64 (L) 12/31/2022 0855   HCO3 30.6 (H) 12/31/2022 0855   ACIDBASEDEF 3.3 (H) 12/29/2022 0206   O2SAT 94.2 12/31/2022 0855     Coagulation Profile: No results for input(s): "INR", "PROTIME" in the last 168 hours.  Cardiac Enzymes: Recent Labs  Lab 12/25/22 0409  CKTOTAL 81     HbA1C: Hgb A1c MFr Bld  Date/Time Value Ref Range Status  11/20/2022 05:04 AM 7.4 (H) 4.8 - 5.6 % Final    Comment:    (NOTE)         Prediabetes: 5.7 - 6.4         Diabetes: >6.4         Glycemic control for adults with diabetes: <7.0   02/15/2022 05:57 AM 5.9 (H) 4.8 - 5.6 % Final    Comment:    (NOTE)  Prediabetes: 5.7 - 6.4         Diabetes: >6.4         Glycemic control for adults with diabetes: <7.0     CBG: Recent Labs  Lab 12/30/22 1543 12/30/22 2008 12/30/22 2346 12/31/22 0341 12/31/22 0749  GLUCAP 247* 188* 205* 199* 238*    Past Medical History:  He,  has a past medical history of Anxiety, Arthritis, Asthma, Colon polyps, COPD (chronic obstructive pulmonary disease) ([place]), Depression, Diabetes ([place]), Difficult intubation, GERD (gastroesophageal reflux disease), High cholesterol, Hyperlipidemia, Hypertension, Hyponatremia (02/23/2022), and Sleep apnea.   Surgical History:   Past Surgical History:  Procedure Laterality Date   BRONCHOSCOPY     CARDIAC CATHETERIZATION Left 11/04/2016   Procedure: Left Heart Cath and Coronary Angiography;  Surgeon: Teodoro Spray, MD;  Location: [place] [facility];  Service: Cardiovascular;  Laterality: Left;   CARDIAC CATHETERIZATION     CHOLECYSTECTOMY N/A 05/09/2019   Procedure: LAPAROSCOPIC CHOLECYSTECTOMY WITH INTRAOPERATIVE CHOLANGIOGRAM;  Surgeon: Jules Husbands, MD;  Location: [facility];  Service: General;  Laterality: N/A;   COLOSTOMY     IR KYPHO LUMBAR INC FX REDUCE BONE BX UNI/BIL CANNULATION INC/IMAGING  11/10/2022   IR RADIOLOGIST EVAL & MGMT  11/08/2022   LEFT HEART CATH AND CORONARY ANGIOGRAPHY N/A 12/14/2021   Procedure: LEFT HEART CATH AND CORONARY ANGIOGRAPHY;  Surgeon: Corey Skains, MD;  Location: [place] [facility];  Service: Cardiovascular;  Laterality: N/A;   ROTATOR CUFF REPAIR Left    THUMB ARTHROSCOPY  2015   TONSILLECTOMY  1952   TONSILLECTOMY       Social History:   reports  that he has quit smoking. His smoking use included cigarettes. He has a 15.00 pack-year smoking history. He has never used smokeless tobacco. He reports current alcohol use of about 1.0 - 2.0 standard drink of alcohol per week. He reports that he does not use drugs.   Family History:  His family history includes Heart attack in his mother; Leukemia in his brother; Prostate cancer in his brother and father.   Allergies Allergies  Allergen Reactions   Ace Inhibitors Cough   Metoprolol Diarrhea    Patient has been tolerating Propranolol 40 mg twice daily since at least 05/2022.   Nsaids Rash   Sulfa Antibiotics Rash     Home Medications  Prior to Admission medications   Medication Sig Start Date End Date Taking? Authorizing Provider  albuterol (ACCUNEB) 1.25 MG/3ML nebulizer solution Take 3 mLs by nebulization every 6 (six) hours as needed for wheezing. 11/20/22  Yes Wieting, Richard, MD  ALPRAZolam Duanne Moron) 0.5 MG tablet Take 0.5 mg by mouth daily as needed for anxiety. 10/06/22 10/06/23 Yes [provider]  aspirin EC 81 MG tablet Take 81 mg by mouth daily. Swallow whole.   Yes [provider]  diltiazem (CARDIZEM CD) 240 MG 24 hr capsule Take 240 mg by mouth every morning. 12/07/21  Yes [provider]  donepezil (ARICEPT) 5 MG tablet Take 5 mg by mouth at bedtime. 05/10/22  Yes [provider]  fluticasone (FLONASE) 50 MCG/ACT nasal spray Place 1 spray into both nostrils daily as needed for allergies or rhinitis.   Yes [provider]  folic acid (FOLVITE) 1 MG tablet Take 1 tablet (1 mg total) by mouth daily. 03/01/22  Yes Wieting, Richard, MD  gabapentin (NEURONTIN) 300 MG capsule Take 1 capsule (300 mg total) by mouth at bedtime. 02/28/22  Yes Loletha Grayer, MD  melatonin  5 MG TABS Take 1 tablet (5 mg total) by mouth at bedtime. 02/28/22  Yes Wieting, Richard, MD  nitroGLYCERIN (NITROSTAT) 0.4 MG SL tablet Place 0.4 mg under the tongue every 5  (five) minutes as needed for chest pain.   Yes [provider]  omeprazole (PRILOSEC) 40 MG capsule Take 40 mg by mouth daily. 04/20/22  Yes [provider]  ondansetron (ZOFRAN-ODT) 4 MG disintegrating tablet Take 1 tablet (4 mg total) by mouth every 8 (eight) hours as needed for nausea or vomiting. 06/11/22  Yes Carrie Mew, MD  propranolol (INDERAL) 40 MG tablet Take 40 mg by mouth 2 (two) times daily. 11/07/22  Yes [provider]  QUEtiapine (SEROQUEL) 25 MG tablet One tab po qhs (can take extra dose if not sleeping) 02/28/22  Yes Wieting, Richard, MD  rosuvastatin (CRESTOR) 5 MG tablet Take 5 mg by mouth daily. 05/10/22  Yes [provider]  SYMBICORT 80-4.5 MCG/ACT inhaler Inhale 2 puffs into the lungs 2 (two) times daily as needed.  05/19/20  Yes [provider]  venlafaxine XR (EFFEXOR-XR) 37.5 MG 24 hr capsule Take 37.5 mg by mouth daily. 12/07/21  Yes [provider]  azithromycin (ZITHROMAX) 250 MG tablet One tab po daily for three days Patient not taking: Reported on 12/01/2022 11/21/22   Loletha Grayer, MD  guaiFENesin (ROBITUSSIN) 100 MG/5ML liquid Take 5 mLs by mouth every 4 (four) hours as needed for cough or to loosen phlegm. Patient not taking: Reported on 12/24/2022 02/28/22   Loletha Grayer, MD  polyethylene glycol (MIRALAX / GLYCOLAX) 17 g packet Take 17 g by mouth daily as needed for moderate constipation. Patient not taking: Reported on 11/08/2022 02/28/22   Loletha Grayer, MD     Critical care time: 90 minutes    Armando Reichert, MD [facility] 12/31/2022 11:47 AM

## 2022-12-31 NOTE — Progress Notes (Signed)
[facility] for Electrolyte Monitoring and Replacement   Recent Labs: Potassium (mmol/L)  Date Value  12/31/2022 4.9   Magnesium (mg/dL)  Date Value  12/31/2022 2.3   Calcium (mg/dL)  Date Value  12/31/2022 8.8 (L)   Albumin (g/dL)  Date Value  12/28/2022 2.1 (L)   Phosphorus (mg/dL)  Date Value  12/31/2022 2.8   Sodium (mmol/L)  Date Value  12/31/2022 142   Corrected Ca: 9.9 mg/dL  Assessment 82 y.o. male presenting with seizure. PMH significant for AUD, HTN, HLD, DM, COPD, dementia, depression with anxiety, pseudoseizure, former smoker. Pharmacy has been consulted to monitor and replace electrolytes.  Diet: Vital AF at 40 mL/hr + free water flushes 30 mL per tube q4h  Goal of Therapy: Electrolytes WNL  Plan:  No electrolyte replacement warranted for today Recheck electrolytes with AM labs  Thank you for allowing pharmacy to be a part of this patient's care.  Vallery Sa, PharmD, BCPS 12/31/2022 12:54 PM

## 2022-12-31 NOTE — Progress Notes (Signed)
[facility]  PROGRESS NOTE   Subjective:   Provide Notes appreciated.  Poorly responsive on the vent.  Off of sedation now.  Objective:  Vital signs: Blood pressure (!) 162/75, pulse (!) 101, temperature 99.7 F (37.6 C), temperature source Axillary, resp. rate (!) 34, height '5\' 4"'$  (1.626 m), weight 85.9 kg, SpO2 94 %.  Intake/Output Summary (Last 24 hours) at 12/31/2022 1430 Last data filed at 12/31/2022 1211 Gross per 24 hour  Intake 976.38 ml  Output 1295 ml  Net -318.62 ml   Filed Weights   12/29/22 0500 12/30/22 0447 12/31/22 0500  Weight: 85.1 kg 86.2 kg 85.9 kg     Physical Exam: General:  No acute distress  Head:  Normocephalic, atraumatic. Moist oral mucosal membranes  Eyes:  Anicteric  Neck:  Supple  Lungs:   Clear to auscultation, normal effort  Heart:  S1S2 no rubs  Abdomen:   Soft, nontender, bowel sounds present  Extremities:  peripheral edema.  Neurologic: Poorly responsive  Skin:  No lesions  Access:     Basic Metabolic Panel: Recent Labs  Lab 12/25/22 2159 12/26/22 0440 12/27/22 0416 12/28/22 0322 12/29/22 0204 12/30/22 0440 12/31/22 0637  NA 135   < > 139 139 137 138 142  K 4.9   < > 4.5 4.7 4.2 4.5 4.9  CL 104   < > 104 107 104 104 104  CO2 20*   < > 24 18* 20* 25 26  GLUCOSE 158*   < > 97 150* 244* 217* 226*  BUN 42*   < > 57* 64* 64* 69* 79*  CREATININE 3.29*   < > 2.42* 2.07* 1.70* 1.75* 1.96*  CALCIUM 8.1*   < > 8.5* 8.6* 8.6* 8.6* 8.8*  MG 2.2  --   --  2.6* 2.3 2.3 2.3  PHOS  --    < > 4.7* 4.5 2.5 2.4* 2.8   < > = values in this interval not displayed.   GFR: Estimated Creatinine Clearance: 29.7 mL/min (A) (by C-G formula based on SCr of 1.96 mg/dL (H)).  Liver Function Tests: Recent Labs  Lab 12/25/22 0410 12/26/22 0440 12/27/22 0416 12/28/22 0322  ALBUMIN 2.4* 2.6* 2.2* 2.1*   No results for input(s): "LIPASE", "AMYLASE" in the last 168 hours. Recent Labs  Lab 12/29/22 1113  AMMONIA 44*     CBC: Recent Labs  Lab 12/27/22 0416 12/28/22 0322 12/29/22 0204 12/30/22 0440 12/31/22 0637  WBC 5.3 4.7 4.9 8.0 13.5*  HGB 10.5* 9.8* 9.9* 9.2* 10.1*  HCT 33.2* 30.9* 30.2* 28.9* 30.9*  MCV 104.1* 102.7* 99.7 100.0 99.4  PLT 139* 152 175 210 290     HbA1C: Hgb A1c MFr Bld  Date/Time Value Ref Range Status  11/20/2022 05:04 AM 7.4 (H) 4.8 - 5.6 % Final    Comment:    (NOTE)         Prediabetes: 5.7 - 6.4         Diabetes: >6.4         Glycemic control for adults with diabetes: <7.0   02/15/2022 05:57 AM 5.9 (H) 4.8 - 5.6 % Final    Comment:    (NOTE)         Prediabetes: 5.7 - 6.4         Diabetes: >6.4         Glycemic control for adults with diabetes: <7.0     Urinalysis: No results for input(s): "COLORURINE", "LABSPEC", "PHURINE", "GLUCOSEU", "HGBUR", "BILIRUBINUR", "KETONESUR", "PROTEINUR", "  UROBILINOGEN", "NITRITE", "LEUKOCYTESUR" in the last 72 hours.  Invalid input(s): "APPERANCEUR"    Imaging: ECHOCARDIOGRAM COMPLETE  Result Date: 12/31/2022    ECHOCARDIOGRAM REPORT   Patient Name:   Jilda Roche Date of Exam: 12/31/2022 Medical Rec #:  [id number]         Height:       64.0 in Accession #:    [id number]        Weight:       189.4 lb Date of Birth:  09-17-1941        BSA:          1.912 m Patient Age:    82 years          BP:           156/77 mmHg Patient Gender: M                 HR:           102 bpm. Exam Location:  [facility] Procedure: 2D Echo Indications:     Elevated Troponin  History:         Patient has no prior history of Echocardiogram examinations.  Sonographer:     Kathlen Brunswick RDCS Referring Phys:  [id number] Teressa Lower Diagnosing Phys: Neoma Laming  Sonographer Comments: Echo performed with patient supine and on artificial respirator and suboptimal subcostal window. Image acquisition challenging due to respiratory motion. IMPRESSIONS  1. Left ventricular ejection fraction, by estimation, is 60 to 65%. The left ventricle has normal function. The  left ventricle has no regional wall motion abnormalities. There is mild left ventricular hypertrophy. Left ventricular diastolic parameters are consistent with Grade I diastolic dysfunction (impaired relaxation).  2. Right ventricular systolic function is normal. The right ventricular size is normal.  3. Left atrial size was mildly dilated.  4. Right atrial size was mildly dilated.  5. The mitral valve is normal in structure. Trivial mitral valve regurgitation. No evidence of mitral stenosis.  6. The aortic valve is normal in structure. Aortic valve regurgitation is not visualized. No aortic stenosis is present.  7. The inferior vena cava is normal in size with greater than 50% respiratory variability, suggesting right atrial pressure of 3 mmHg. FINDINGS  Left Ventricle: Left ventricular ejection fraction, by estimation, is 60 to 65%. The left ventricle has normal function. The left ventricle has no regional wall motion abnormalities. The left ventricular internal cavity size was normal in size. There is  mild left ventricular hypertrophy. Left ventricular diastolic parameters are consistent with Grade I diastolic dysfunction (impaired relaxation). Right Ventricle: The right ventricular size is normal. No increase in right ventricular wall thickness. Right ventricular systolic function is normal. Left Atrium: Left atrial size was mildly dilated. Right Atrium: Right atrial size was mildly dilated. Pericardium: There is no evidence of pericardial effusion. Mitral Valve: The mitral valve is normal in structure. Trivial mitral valve regurgitation. No evidence of mitral valve stenosis. Tricuspid Valve: The tricuspid valve is normal in structure. Tricuspid valve regurgitation is trivial. No evidence of tricuspid stenosis. Aortic Valve: The aortic valve is normal in structure. Aortic valve regurgitation is not visualized. No aortic stenosis is present. Aortic valve peak gradient measures 9.9 mmHg. Pulmonic Valve: The  pulmonic valve was normal in structure. Pulmonic valve regurgitation is not visualized. No evidence of pulmonic stenosis. Aorta: The aortic root is normal in size and structure. Venous: The inferior vena cava is normal in size with greater than 50% respiratory variability, suggesting right  atrial pressure of 3 mmHg. IAS/Shunts: No atrial level shunt detected by color flow Doppler.  LEFT VENTRICLE PLAX 2D LVIDd:         3.70 cm     Diastology LVIDs:         2.50 cm     LV e' medial:    8.16 cm/s LV PW:         1.30 cm     LV E/e' medial:  8.7 LV IVS:        1.20 cm     LV e' lateral:   8.92 cm/s LVOT diam:     1.90 cm     LV E/e' lateral: 8.0 LV SV:         54 LV SV Index:   28 LVOT Area:     2.84 cm  LV Volumes (MOD) LV vol d, MOD A4C: 44.8 ml LV vol s, MOD A4C: 17.8 ml LV SV MOD A4C:     44.8 ml RIGHT VENTRICLE RV Basal diam:  2.30 cm RV S prime:     15.90 cm/s TAPSE (M-mode): 1.5 cm LEFT ATRIUM             Index        RIGHT ATRIUM           Index LA diam:        3.50 cm 1.83 cm/m   RA Area:     10.10 cm LA Vol (A2C):   31.2 ml 16.32 ml/m  RA Volume:   18.20 ml  9.52 ml/m LA Vol (A4C):   28.4 ml 14.86 ml/m LA Biplane Vol: 31.1 ml 16.27 ml/m  AORTIC VALVE                 PULMONIC VALVE AV Area (Vmax): 2.00 cm     PV Vmax:       1.17 m/s AV Vmax:        157.00 cm/s  PV Peak grad:  5.5 mmHg AV Peak Grad:   9.9 mmHg LVOT Vmax:      110.50 cm/s LVOT Vmean:     73.300 cm/s LVOT VTI:       0.192 m  AORTA Ao Root diam: 3.20 cm Ao Asc diam:  3.00 cm MITRAL VALVE               TRICUSPID VALVE MV Area (PHT): 5.09 cm    TR Peak grad:   51.3 mmHg MV Decel Time: 149 msec    TR Vmax:        358.00 cm/s MV E velocity: 71.30 cm/s MV A velocity: 83.30 cm/s  SHUNTS MV E/A ratio:  0.86        Systemic VTI:  0.19 m                            Systemic Diam: 1.90 cm Neoma Laming Electronically signed by Neoma Laming Signature Date/Time: 12/31/2022/12:54:05 PM    Final    DG Abd 1 View  Result Date: 12/31/2022 CLINICAL DATA:   Abdominal distension EXAM: ABDOMEN - 1 VIEW COMPARISON:  Three days ago FINDINGS: Enteric tube with tip at the stomach and side port near the GE junction. Normal bowel gas pattern. Retrocardiac airspace disease with air bronchogram. Cholecystectomy clips. IMPRESSION: Enteric tube with tip at the stomach.  Normal bowel gas pattern. Retrocardiac consolidation/collapse. Electronically Signed   By: Jorje Guild M.D.   On: 12/31/2022 10:31  DG Chest Port 1 View  Result Date: 12/30/2022 CLINICAL DATA:  Acute respiratory failure EXAM: PORTABLE CHEST 1 VIEW COMPARISON:  Chest x-ray dated December 28, 2022 FINDINGS: Unchanged position of ETT and enteric tube. Patient is rotated to the left. Cardiac and mediastinal contours are within normal limits. New left retrocardiac consolidation. No large pleural effusion or evidence of pneumothorax. IMPRESSION: New left retrocardiac consolidation, concerning for infection or left lower lobe collapse. Electronically Signed   By: Yetta Glassman M.D.   On: 12/30/2022 10:24   EEG adult  Result Date: 12/30/2022 Derek Jack, MD     12/30/2022  8:58 AM Routine EEG Report Kennedy Bohanon is a 82 y.o. male with a history of seizure who is undergoing an EEG to evaluate for seizures. Report: This EEG was acquired with electrodes placed according to the International 10-20 electrode system (including Fp1, Fp2, F3, F4, C3, C4, P3, P4, O1, O2, T3, T4, T5, T6, A1, A2, Fz, Cz, Pz). The following electrodes were missing or displaced: none. The best background was diffusely slow at 3-5 Hz with overriding beta frequencies. This activity is reactive to stimulation. No sleep architecture was identified. There was no focal slowing. There were no definitive interictal epileptiform discharges. There were no electrographic seizures identified. Photic stimulation and hyperventilation were not performed. Impression and clinical correlation: This EEG was obtained while sedated on propofol and is  abnormal due to: - severe diffuse slowing indicative of global cerebral dysfunction, medication effect, or both Su Monks, MD [facility] [phone] If 7pm- 7am, please page neurology on call as listed in [place].     Medications:    sodium chloride     feeding supplement (VITAL AF 1.2 CAL) 40 mL/hr at 12/31/22 0600   piperacillin-tazobactam (ZOSYN)  IV 12.5 mL/hr at 12/31/22 0600   propofol (DIPRIVAN) infusion Stopped (12/30/22 0815)    Chlorhexidine Gluconate Cloth  6 each Topical Daily   docusate  100 mg Per Tube BID   feeding supplement (PROSource TF20)  60 mL Per Tube Daily   folic acid  1 mg Per Tube Daily   free water  30 mL Per Tube Q4H   heparin injection (subcutaneous)  5,000 Units Subcutaneous Q8H   insulin aspart  0-20 Units Subcutaneous Q4H   insulin aspart  3 Units Subcutaneous Q4H   ipratropium-albuterol  3 mL Nebulization Q4H   multivitamin with minerals  1 tablet Per Tube Daily   mouth rinse  15 mL Mouth Rinse Q2H   pantoprazole (PROTONIX) IV  40 mg Intravenous QHS   polyethylene glycol  17 g Per Tube Daily   propranolol  60 mg Per Tube BID   thiamine (VITAMIN B1) injection  100 mg Intravenous Daily   venlafaxine  18.75 mg Per Tube BID WC   vitamin B-12  100 mcg Per Tube Daily    Assessment/ Plan:     Mr. Kabir Brannock is a 82 y.o.  male with a PMHx of advanced COPD, asthma, diabetes mellitus type 2, GERD, hyperlipidemia, hypertension, obstructive sleep apnea, anxiety, who was admitted to [facility] on 12/11/2022 for evaluation of severe tremors.  Patient has history of alcohol intake nightly.  He developed significant delirium tremens upon admission and subsequently intubated for airway protection.   #1: Acute kidney injury: Patient with acute kidney injury most likely secondary to acute tubular necrosis due to hemodynamic compromise.  Renal sonogram was negative.  Renal indices are stable at this time.  #2: Acute respiratory failure: Difficulty  weaning.  Protocols as per critical care.  #3: Diabetes: Continue insulin as per protocol.  #4: Sepsis: Patient has been on Zosyn and will continue the same.  Prognosis is poor.  Will continue to follow.      LOS: [place], [facility] 1/6/20242:30 PM

## 2023-01-01 ENCOUNTER — Inpatient Hospital Stay: Payer: Medicare Other

## 2023-01-01 DIAGNOSIS — G9340 Encephalopathy, unspecified: Secondary | ICD-10-CM | POA: Diagnosis not present

## 2023-01-01 DIAGNOSIS — N179 Acute kidney failure, unspecified: Secondary | ICD-10-CM

## 2023-01-01 DIAGNOSIS — F10931 Alcohol use, unspecified with withdrawal delirium: Secondary | ICD-10-CM | POA: Diagnosis not present

## 2023-01-01 DIAGNOSIS — G928 Other toxic encephalopathy: Secondary | ICD-10-CM | POA: Diagnosis not present

## 2023-01-01 DIAGNOSIS — R569 Unspecified convulsions: Secondary | ICD-10-CM | POA: Diagnosis not present

## 2023-01-01 DIAGNOSIS — J9601 Acute respiratory failure with hypoxia: Secondary | ICD-10-CM | POA: Diagnosis not present

## 2023-01-01 LAB — HEPATIC FUNCTION PANEL
ALT: 33 U/L (ref 0–44)
AST: 52 U/L — ABNORMAL HIGH (ref 15–41)
Albumin: 1.8 g/dL — ABNORMAL LOW (ref 3.5–5.0)
Alkaline Phosphatase: 106 U/L (ref 38–126)
Bilirubin, Direct: 0.2 mg/dL (ref 0.0–0.2)
Indirect Bilirubin: 0.8 mg/dL (ref 0.3–0.9)
Total Bilirubin: 1 mg/dL (ref 0.3–1.2)
Total Protein: 6.7 g/dL (ref 6.5–8.1)

## 2023-01-01 LAB — BASIC METABOLIC PANEL
Anion gap: 12 (ref 5–15)
Anion gap: 13 (ref 5–15)
BUN: 92 mg/dL — ABNORMAL HIGH (ref 8–23)
BUN: 98 mg/dL — ABNORMAL HIGH (ref 8–23)
CO2: 26 mmol/L (ref 22–32)
CO2: 26 mmol/L (ref 22–32)
Calcium: 8.6 mg/dL — ABNORMAL LOW (ref 8.9–10.3)
Calcium: 8.7 mg/dL — ABNORMAL LOW (ref 8.9–10.3)
Chloride: 105 mmol/L (ref 98–111)
Chloride: 102 mmol/L (ref 98–111)
Creatinine, Ser: 1.92 mg/dL — ABNORMAL HIGH (ref 0.61–1.24)
Creatinine, Ser: 1.85 mg/dL — ABNORMAL HIGH (ref 0.61–1.24)
GFR, Estimated: 35 mL/min — ABNORMAL LOW (ref >60)
GFR, Estimated: 36 mL/min — ABNORMAL LOW (ref >60)
Glucose, Bld: 218 mg/dL — ABNORMAL HIGH (ref 70–99)
Glucose, Bld: 274 mg/dL — ABNORMAL HIGH (ref 70–99)
Potassium: 4.7 mmol/L (ref 3.5–5.1)
Potassium: 4.1 mmol/L (ref 3.5–5.1)
Sodium: 143 mmol/L (ref 135–145)
Sodium: 141 mmol/L (ref 135–145)

## 2023-01-01 LAB — CBC
HCT: 29.4 % — ABNORMAL LOW (ref 39.0–52.0)
Hemoglobin: 9.5 g/dL — ABNORMAL LOW (ref 13.0–17.0)
MCH: 32.2 pg (ref 26.0–34.0)
MCHC: 32.3 g/dL (ref 30.0–36.0)
MCV: 99.7 fL (ref 80.0–100.0)
Platelets: 305 10*3/uL (ref 150–400)
RBC: 2.95 MIL/uL — ABNORMAL LOW (ref 4.22–5.81)
RDW: 19.6 % — ABNORMAL HIGH (ref 11.5–15.5)
WBC: 11.7 10*3/uL — ABNORMAL HIGH (ref 4.0–10.5)
nRBC: 1.2 % — ABNORMAL HIGH (ref 0.0–0.2)

## 2023-01-01 LAB — MAGNESIUM
Magnesium: 2.3 mg/dL (ref 1.7–2.4)
Magnesium: 2.4 mg/dL (ref 1.7–2.4)

## 2023-01-01 LAB — PROTIME-INR
INR: 1.3 — ABNORMAL HIGH (ref 0.8–1.2)
Prothrombin Time: 15.8 seconds — ABNORMAL HIGH (ref 11.4–15.2)

## 2023-01-01 LAB — GLUCOSE, CAPILLARY
Glucose-Capillary: 181 mg/dL — ABNORMAL HIGH (ref 70–99)
Glucose-Capillary: 185 mg/dL — ABNORMAL HIGH (ref 70–99)
Glucose-Capillary: 194 mg/dL — ABNORMAL HIGH (ref 70–99)
Glucose-Capillary: 196 mg/dL — ABNORMAL HIGH (ref 70–99)
Glucose-Capillary: 219 mg/dL — ABNORMAL HIGH (ref 70–99)
Glucose-Capillary: 238 mg/dL — ABNORMAL HIGH (ref 70–99)
Glucose-Capillary: 249 mg/dL — ABNORMAL HIGH (ref 70–99)
Glucose-Capillary: 259 mg/dL — ABNORMAL HIGH (ref 70–99)

## 2023-01-01 LAB — AMMONIA: Ammonia: 57 umol/L — ABNORMAL HIGH (ref 9–35)

## 2023-01-01 LAB — PHOSPHORUS
Phosphorus: 3.4 mg/dL (ref 2.5–4.6)
Phosphorus: 3.8 mg/dL (ref 2.5–4.6)

## 2023-01-01 LAB — POTASSIUM: Potassium: 4.1 mmol/L (ref 3.5–5.1)

## 2023-01-01 LAB — TROPONIN I (HIGH SENSITIVITY): Troponin I (High Sensitivity): 21 ng/L — ABNORMAL HIGH (ref <18)

## 2023-01-01 LAB — APTT: aPTT: 37 seconds — ABNORMAL HIGH (ref 24–36)

## 2023-01-01 MED ORDER — IPRATROPIUM-ALBUTEROL 0.5-2.5 (3) MG/3ML IN SOLN
3.0000 mL | Freq: Four times a day (QID) | RESPIRATORY_TRACT | Status: DC
  Administered 2023-01-01 – 2023-01-02 (×4): 3 mL via RESPIRATORY_TRACT
  Filled 2023-01-01 (×4): qty 3

## 2023-01-01 MED ORDER — LACTULOSE 10 GM/15ML PO SOLN
20.0000 g | Freq: Three times a day (TID) | ORAL | Status: DC
  Administered 2023-01-01 – 2023-01-02 (×6): 20 g via ORAL
  Filled 2023-01-01 (×6): qty 30

## 2023-01-01 MED ORDER — PROPRANOLOL HCL 20 MG PO TABS: 40.0000 mg | ORAL_TABLET | Freq: Two times a day (BID) | ORAL | Status: DC

## 2023-01-01 MED ORDER — ATROPINE SULFATE 1 MG/10ML IJ SOSY
PREFILLED_SYRINGE | INTRAMUSCULAR | Status: AC
  Filled 2023-01-01: qty 10

## 2023-01-01 MED ORDER — INSULIN ASPART 100 UNIT/ML IJ SOLN
5.0000 [IU] | INTRAMUSCULAR | Status: DC
  Administered 2023-01-01 – 2023-01-02 (×8): 5 [IU] via SUBCUTANEOUS
  Filled 2023-01-01 (×9): qty 1

## 2023-01-01 NOTE — Progress Notes (Signed)
Progress Note   Updated pts wife regarding pts condition and current plan of care all questions were answered.   Donell Beers, [place] Pager [phone] (please enter 7 digits) PCCM Consult Pager [phone] (please enter 7 digits)

## 2023-01-01 NOTE — Progress Notes (Signed)
[place] for Electrolyte Monitoring and Replacement   Recent Labs: Potassium (mmol/L)  Date Value  01/01/2023 4.7   Magnesium (mg/dL)  Date Value  01/01/2023 2.3   Calcium (mg/dL)  Date Value  01/01/2023 8.6 (L)   Albumin (g/dL)  Date Value  01/01/2023 1.8 (L)   Phosphorus (mg/dL)  Date Value  01/01/2023 3.4   Sodium (mmol/L)  Date Value  01/01/2023 143   Corrected Ca: 9.9 mg/dL  Assessment 82 y.o. male presenting with seizure. PMH significant for AUD, HTN, HLD, DM, COPD, dementia, depression with anxiety, pseudoseizure, former smoker. Pharmacy has been consulted to monitor and replace electrolytes.  Diet: Vital AF at 60 mL/hr + free water flushes 30 mL per tube q4h  Goal of Therapy: Electrolytes WNL  Plan:  No electrolyte replacement warranted for today Nephrology following Recheck electrolytes with AM labs  Thank you for allowing pharmacy to be a part of this patient's care.  Chinita Greenland PharmD Clinical Pharmacist 01/01/2023

## 2023-01-01 NOTE — Progress Notes (Signed)
NAME:  Brett Blake, MRN:  [id number], DOB:  [birth year], LOS: 10 ADMISSION DATE:  12/16/2022, CONSULTATION DATE: 11/27/2022 REFERRING MD: Dr. Blaine Hamper, CHIEF COMPLAINT: AMS   History of Present Illness:  This is an 82 yo male with hx of ETOH Abuse who presented to [facility] ER on 12/28 via EMS from home following a possible seizure activity the morning of 12/28.  He has a hx of non epileptic seizures, however he does not take antiepileptics.  He reported he has not had a seizure in a long time.  All hx obtained from pts chart and daughter.   ED Course Upon arrival to the ER pt noted to have severe tremors.  Pt reported he drinks 1 small alcoholic beverage every night prior to going to bed.  However, pts wife reported he drank hard liquor heavily over the past several days and abruptly stopped drinking 2 days ago.  He also complained of an upset stomach.  ER lab results revealed K+ 3.1/glucose 138/calcium 8.3/albumin 3.4/AST 81/ALT 62/total protein 6.4/magnesium 1.6/wbc 3.9/hgb 12.1/platelets 125.  COVID-19/Influenza A&B negative.  Pt had a CIWA score of 4, he received 1 mg of iv ativan.  Hospitalist initially contacted for hospital admission.  However, pt became increasingly agitated and combative despite receiving a total of 3 mg of iv ativan and 3 mg of IM haldol.  Security called to bedside.  PCCM team consulted due to pt requiring precedex gtt.  However, despite precedex gtt at maximal dose; additional 2 mg of iv ativan, 12.5 mg of iv fentanyl, and 2.5 mg of iv haldol pt remained combative/agitated requiring mechanical intubation.  Pt admitted to ICU per PCCM team for additional workup and treatment.    CT Head 12/28:  No evidence of acute intracranial abnormality. Mild chronic small vessel ischemic disease. CXR 12/28: No active cardiopulmonary disease.   Please see "significant hospital events" section below for full detailed hospital course.  Pertinent  Medical History  Anxiety  Arthritis   Asthma  Colon Polyps  COPD Depression  Type II Diabetes Mellitus Difficult Intubation  GERD  Hypercholesteremia  HLD HTN  Hyponatremia  OSA   Micro Data:  12/28: SARS-CoV-2 and influenza PCR>> negative 12/28: MRSA PCR>> negative  Antimicrobials:   Anti-infectives (From admission, onward)    Start     Dose/Rate Route Frequency Ordered Stop   12/30/22 2200  piperacillin-tazobactam (ZOSYN) IVPB 3.375 g        3.375 g 12.5 mL/hr over 240 Minutes Intravenous Every 8 hours 12/30/22 [facility] Events: Including procedures, antibiotic start and stop dates in addition to other pertinent events   12/28: Pt admitted with possible seizure activity suspect secondary to severe ETOH withdrawal requiring mechanical intubation  12/28: CT Head>No evidence of acute intracranial abnormality. Mild chronic small vessel ischemic disease.  12/23/22- patient still having seizures despite propofol, the seizures do respond to Ativan.  Pharmacy is monitoring electrolytes.  He is weaning off levophed.  He will start feeds today.  ABG with res alkalosis and RR was reduced.  12/24/22- patient improved, dcd phenobarb and wake up assessment was very poor today with onset of tachyarrythmia in 140s and seizure like activity upon reduction of sedation.  12/25/22-  patient is critically ill, he is not tolerating reduction of sedation with emergence of seizure like activity and tachyarrythmia. Wife and nephew at bedside we discussed short term plan. Today we have transitioned to herparin from lovenox due to worsening  renal impairment.  12/26/22- patient was not able to tolerate reduction in sedation with devt of severe hypertension and tachyarrythmia 12/27/22:  AKI slowly improving.  Start Precedex to assist with WUA. 12/28/22: MR Brain WO Contrast>no acute or reversible finding. Generalized age  related atrophy. Minimal small vessel change of the cerebral hemispheric white matter, less than  often seen at this age. Chronic nonprogressive benign type  calcification within the posterolateral left thalamus. Sinus mucosal thickening and  layering fluid as often seen in patients with mechanical ventilation. Mastoid effusions as often seen in patients with mechanical ventilation.  12/29/22: Pt failed WUA  12/30/22: EEG negative for electrographic seizures, however severe diffuse slowing indicative of global cerebral dysfunction, medication effect, or both  12/30/22: No acute events overnight. Pt remains mechanically intubated FiO2 35%. Will perform WUA and possible SBT  01/01/23: Pt with continued poor neurological exam remains unresponsive despite not receiving sedating medications since 12/30/22.  CT Head and EEG pending.  Remains on minimal mechanical ventilator settings, however neurological exam impeding extubation   Interim History / Subjective:  As outlined above   Objective   Blood pressure (!) 145/65, pulse 80, temperature 99.3 F (37.4 C), temperature source Oral, resp. rate (!) 30, height '5\' 4"'$  (1.626 m), weight 85.9 kg, SpO2 97 %.    Vent Mode: PRVC FiO2 (%):  [40 %] 40 % Set Rate:  [16 bmp] 16 bmp Vt Set:  [400 mL] 400 mL PEEP:  [5 cmH20] 5 cmH20 Plateau Pressure:  [21 cmH20-22 cmH20] 21 cmH20   Intake/Output Summary (Last 24 hours) at 01/01/2023 1122 Last data filed at 01/01/2023 0600 Gross per 24 hour  Intake --  Output 1060 ml  Net -1060 ml   Filed Weights   12/29/22 0500 12/30/22 0447 12/31/22 0500  Weight: 85.1 kg 86.2 kg 85.9 kg    Examination: General: Acutely on chronicalyy-ill appearing male, mechanically ventilated, synchronous with the vent.  Sedated. HENT: Supple, difficult to assess for JVD due to body habitus, orally intubated Lungs: Faint rhonchi throughout, no wheezing or rhonchi noted, even, non labored, synchronous with the vent Cardiovascular: NSR, s1s2, no r/g, 2+ radial/1+ distal, 1+ generalized edema  Abdomen: +BS x4, obese, soft, distended, rectal  tube in place with stool present  Extremities: Normal bulk and tone, no deformities Neuro: Sedated, not following commands, corneal and gag reflexes present, PERRL  GU: Indwelling foley catheter draining yellow urine  Resolved Hospital Problem list     Assessment & Plan:   Intubation and mechanical ventilation for airway protection in the setting of ETOH withdrawal  Hx: OSA, COPD, Asthma, and Difficult Intubation  - Full vent support, implement lung protective strategies - Plateau pressures less than 30 cm H20 - Wean FiO2 & PEEP as tolerated to maintain O2 sats 88 to 92% - Follow intermittent Chest X-ray & ABG as needed - Spontaneous Breathing Trials when respiratory parameters met and mental status permits - Continue VAP Bundle - Bronchodilators & Pulmicort nebs  Hypertension Tachycardia, due to DT's Hx: Hypercholesteremia   Echo 12/31/22: EF 60 to 82%; grade I diastolic dysfunction  - Continuous cardiac monitoring - Continue propranolol and prn hydralazine for bp management  - Continue outpatient rosuvastatin   Acute kidney injury secondary to ATN~worsening   - Renal US negative - Trend BMP  - Replace electrolytes as indicated  - Monitor UOP  - Avoid nephrotoxic medications - Nephrology following, appreciate input ~ no acute need for dialysis  LLL pneumonia  - Trend WBC and  monitor fever curve  - Follow cultures  - Continue zosyn for now pending respiratory culture results/sensitivities   Type II diabetes mellitus  - CBG's q4h; Target range of 140 to 180 - SSI and scheduled novolog  - Follow ICU Hypo/Hyperglycemia protocol  Possible seizure activity in the setting of ETOH withdrawal  Delirium Tremens Sedation needs in setting of mechanical ventilation CT Head on admission: negative for acute intracranial abnormality EEG 12/23/22: suggestive of severe to profound diffuse encephalopathy, nonspecific etiology but likely related to sedation. No seizures or epileptiform  discharges were seen throughout the recording.  MRI 1/3: with no acute or reversible finding. Chronic age related changes EEG 1/5: negative for seizure activity  - Maintain a RASS goal of 0  - Propofol gtt and prn fentanyl to maintain RASS goal - Avoid sedating medications as able - Daily wake up assessment  - Seizure precautions  - CIWA protocol - Continue thiamine, folic acid, and MVI - Will start lactulose: goal 1L of stool daily  - CT Head and EEG pending; if CT Head reveals no intracranial abnormalities will require LP   Best Practice (right click and "Reselect all SmartList Selections" daily)   Diet/type: NPO, tube feeds DVT prophylaxis: Heparin SQ GI prophylaxis: H2B Lines: N/A Foley:  Yes, and it is still needed Code Status:  full code Last date of multidisciplinary goals of care discussion [01/01/23]  12/30/22: Will update family when they arrive at bedside  Labs   CBC: Recent Labs  Lab 12/28/22 0322 12/29/22 0204 12/30/22 0440 12/31/22 0637 01/01/23 0600  WBC 4.7 4.9 8.0 13.5* 11.7*  HGB 9.8* 9.9* 9.2* 10.1* 9.5*  HCT 30.9* 30.2* 28.9* 30.9* 29.4*  MCV 102.7* 99.7 100.0 99.4 99.7  PLT 152 175 210 290 831    Basic Metabolic Panel: Recent Labs  Lab 12/28/22 0322 12/29/22 0204 12/30/22 0440 12/31/22 0637 01/01/23 0557 01/01/23 0600  NA 139 137 138 142  --  143  K 4.7 4.2 4.5 4.9  --  4.7  CL 107 104 104 104  --  105  CO2 18* 20* 25 26  --  26  GLUCOSE 150* 244* 217* 226*  --  218*  BUN 64* 64* 69* 79*  --  92*  CREATININE 2.07* 1.70* 1.75* 1.96*  --  1.92*  CALCIUM 8.6* 8.6* 8.6* 8.8*  --  8.6*  MG 2.6* 2.3 2.3 2.3  --  2.3  PHOS 4.5 2.5 2.4* 2.8 3.4  --    GFR: Estimated Creatinine Clearance: 30.3 mL/min (A) (by C-G formula based on SCr of 1.92 mg/dL (H)). Recent Labs  Lab 12/29/22 0204 12/30/22 0436 12/30/22 0440 12/31/22 0637 01/01/23 0600  PROCALCITON  --  1.05  --  1.63  --   WBC 4.9  --  8.0 13.5* 11.7*    Liver Function  Tests: Recent Labs  Lab 12/26/22 0440 12/27/22 0416 12/28/22 0322 01/01/23 0600  AST  --   --   --  52*  ALT  --   --   --  33  ALKPHOS  --   --   --  106  BILITOT  --   --   --  1.0  PROT  --   --   --  6.7  ALBUMIN 2.6* 2.2* 2.1* 1.8*   No results for input(s): "LIPASE", "AMYLASE" in the last 168 hours. Recent Labs  Lab 12/29/22 1113  AMMONIA 44*    ABG    Component Value Date/Time   PHART  7.44 12/31/2022 0855   PCO2ART 45 12/31/2022 0855   PO2ART 64 (L) 12/31/2022 0855   HCO3 30.6 (H) 12/31/2022 0855   ACIDBASEDEF 3.3 (H) 12/29/2022 0206   O2SAT 94.2 12/31/2022 0855     Coagulation Profile: No results for input(s): "INR", "PROTIME" in the last 168 hours.  Cardiac Enzymes: No results for input(s): "CKTOTAL", "CKMB", "CKMBINDEX", "TROPONINI" in the last 168 hours.   HbA1C: Hgb A1c MFr Bld  Date/Time Value Ref Range Status  11/20/2022 05:04 AM 7.4 (H) 4.8 - 5.6 % Final    Comment:    (NOTE)         Prediabetes: 5.7 - 6.4         Diabetes: >6.4         Glycemic control for adults with diabetes: <7.0   02/15/2022 05:57 AM 5.9 (H) 4.8 - 5.6 % Final    Comment:    (NOTE)         Prediabetes: 5.7 - 6.4         Diabetes: >6.4         Glycemic control for adults with diabetes: <7.0     CBG: Recent Labs  Lab 12/31/22 1558 12/31/22 1949 01/01/23 0000 01/01/23 0347 01/01/23 0750  GLUCAP 186* 135* 181* 196* 219*    Review of Systems:   Unable to assess pt mechanically intubated   Past Medical History:  He,  has a past medical history of Anxiety, Arthritis, Asthma, Colon polyps, COPD (chronic obstructive pulmonary disease) ([place]), Depression, Diabetes ([place]), Difficult intubation, GERD (gastroesophageal reflux disease), High cholesterol, Hyperlipidemia, Hypertension, Hyponatremia (02/23/2022), and Sleep apnea.   Surgical History:   Past Surgical History:  Procedure Laterality Date   BRONCHOSCOPY     CARDIAC CATHETERIZATION Left 11/04/2016   Procedure:  Left Heart Cath and Coronary Angiography;  Surgeon: Teodoro Spray, MD;  Location: [facility];  Service: Cardiovascular;  Laterality: Left;   CARDIAC CATHETERIZATION     CHOLECYSTECTOMY N/A 05/09/2019   Procedure: LAPAROSCOPIC CHOLECYSTECTOMY WITH INTRAOPERATIVE CHOLANGIOGRAM;  Surgeon: Jules Husbands, MD;  Location: [facility];  Service: General;  Laterality: N/A;   COLOSTOMY     IR KYPHO LUMBAR INC FX REDUCE BONE BX UNI/BIL CANNULATION INC/IMAGING  11/10/2022   IR RADIOLOGIST EVAL & MGMT  11/08/2022   LEFT HEART CATH AND CORONARY ANGIOGRAPHY N/A 12/14/2021   Procedure: LEFT HEART CATH AND CORONARY ANGIOGRAPHY;  Surgeon: Corey Skains, MD;  Location: [facility];  Service: Cardiovascular;  Laterality: N/A;   ROTATOR CUFF REPAIR Left    THUMB ARTHROSCOPY  2015   TONSILLECTOMY  1952   TONSILLECTOMY       Social History:   reports that he has quit smoking. His smoking use included cigarettes. He has a 15.00 pack-year smoking history. He has never used smokeless tobacco. He reports current alcohol use of about 1.0 - 2.0 standard drink of alcohol per week. He reports that he does not use drugs.   Family History:  His family history includes Heart attack in his mother; Leukemia in his brother; Prostate cancer in his brother and father.   Allergies Allergies  Allergen Reactions   Ace Inhibitors Cough   Metoprolol Diarrhea    Patient has been tolerating Propranolol 40 mg twice daily since at least 05/2022.   Nsaids Rash   Sulfa Antibiotics Rash     Home Medications  Prior to Admission medications   Medication Sig Start Date End Date Taking? Authorizing Provider  albuterol (  ACCUNEB) 1.25 MG/3ML nebulizer solution Take 3 mLs by nebulization every 6 (six) hours as needed for wheezing. 11/20/22  Yes Wieting, Richard, MD  ALPRAZolam Duanne Moron) 0.5 MG tablet Take 0.5 mg by mouth daily as needed for anxiety. 10/06/22 10/06/23 Yes [provider]  aspirin EC 81 MG  tablet Take 81 mg by mouth daily. Swallow whole.   Yes [provider]  diltiazem (CARDIZEM CD) 240 MG 24 hr capsule Take 240 mg by mouth every morning. 12/07/21  Yes [provider]  donepezil (ARICEPT) 5 MG tablet Take 5 mg by mouth at bedtime. 05/10/22  Yes [provider]  fluticasone (FLONASE) 50 MCG/ACT nasal spray Place 1 spray into both nostrils daily as needed for allergies or rhinitis.   Yes [provider]  folic acid (FOLVITE) 1 MG tablet Take 1 tablet (1 mg total) by mouth daily. 03/01/22  Yes Wieting, Richard, MD  gabapentin (NEURONTIN) 300 MG capsule Take 1 capsule (300 mg total) by mouth at bedtime. 02/28/22  Yes Wieting, Richard, MD  melatonin 5 MG TABS Take 1 tablet (5 mg total) by mouth at bedtime. 02/28/22  Yes Wieting, Richard, MD  nitroGLYCERIN (NITROSTAT) 0.4 MG SL tablet Place 0.4 mg under the tongue every 5 (five) minutes as needed for chest pain.   Yes [provider]  omeprazole (PRILOSEC) 40 MG capsule Take 40 mg by mouth daily. 04/20/22  Yes [provider]  ondansetron (ZOFRAN-ODT) 4 MG disintegrating tablet Take 1 tablet (4 mg total) by mouth every 8 (eight) hours as needed for nausea or vomiting. 06/11/22  Yes Carrie Mew, MD  propranolol (INDERAL) 40 MG tablet Take 40 mg by mouth 2 (two) times daily. 11/07/22  Yes [provider]  QUEtiapine (SEROQUEL) 25 MG tablet One tab po qhs (can take extra dose if not sleeping) 02/28/22  Yes Wieting, Richard, MD  rosuvastatin (CRESTOR) 5 MG tablet Take 5 mg by mouth daily. 05/10/22  Yes [provider]  SYMBICORT 80-4.5 MCG/ACT inhaler Inhale 2 puffs into the lungs 2 (two) times daily as needed.  05/19/20  Yes [provider]  venlafaxine XR (EFFEXOR-XR) 37.5 MG 24 hr capsule Take 37.5 mg by mouth daily. 12/07/21  Yes [provider]  azithromycin (ZITHROMAX) 250 MG tablet One tab po daily for three days Patient not taking: Reported on 11/25/2022  11/21/22   Loletha Grayer, MD  guaiFENesin (ROBITUSSIN) 100 MG/5ML liquid Take 5 mLs by mouth every 4 (four) hours as needed for cough or to loosen phlegm. Patient not taking: Reported on 12/02/2022 02/28/22   Loletha Grayer, MD  polyethylene glycol (MIRALAX / GLYCOLAX) 17 g packet Take 17 g by mouth daily as needed for moderate constipation. Patient not taking: Reported on 11/08/2022 02/28/22   Loletha Grayer, MD     Scheduled Meds:  Chlorhexidine Gluconate Cloth  6 each Topical Daily   docusate  100 mg Per Tube BID   feeding supplement (PROSource TF20)  60 mL Per Tube Daily   folic acid  1 mg Per Tube Daily   free water  30 mL Per Tube Q4H   heparin injection (subcutaneous)  5,000 Units Subcutaneous Q8H   insulin aspart  0-20 Units Subcutaneous Q4H   insulin aspart  5 Units Subcutaneous Q4H   ipratropium-albuterol  3 mL Nebulization Q6H   lactulose  20 g Oral TID   multivitamin with minerals  1 tablet Per Tube Daily   mouth rinse  15 mL Mouth Rinse Q2H   pantoprazole (  PROTONIX) IV  40 mg Intravenous QHS   polyethylene glycol  17 g Per Tube Daily   propranolol  60 mg Per Tube BID   thiamine (VITAMIN B1) injection  100 mg Intravenous Daily   venlafaxine  18.75 mg Per Tube BID WC   vitamin B-12  100 mcg Per Tube Daily   Continuous Infusions:  sodium chloride     feeding supplement (VITAL AF 1.2 CAL) 40 mL/hr at 12/31/22 0600   piperacillin-tazobactam (ZOSYN)  IV 3.375 g (01/01/23 0511)   propofol (DIPRIVAN) infusion Stopped (12/30/22 0815)   PRN Meds:.albuterol, fentaNYL (SUBLIMAZE) injection, hydrALAZINE, mouth rinse   Critical care time:  40 minutes    Donell Beers, [place] Pager [phone] (please enter 7 digits) PCCM Consult Pager [phone] (please enter 7 digits)

## 2023-01-01 NOTE — Progress Notes (Signed)
[facility]  PROGRESS NOTE   Subjective:   Poorly responsive on the vent.  Objective:  Vital signs: Blood pressure (!) 145/71, pulse 81, temperature 99.3 F (37.4 C), temperature source Oral, resp. rate (!) 28, height '5\' 4"'$  (1.626 m), weight 85.9 kg, SpO2 98 %.  Intake/Output Summary (Last 24 hours) at 01/01/2023 1308 Last data filed at 01/01/2023 1122 Gross per 24 hour  Intake --  Output 985 ml  Net -985 ml   Filed Weights   12/29/22 0500 12/30/22 0447 12/31/22 0500  Weight: 85.1 kg 86.2 kg 85.9 kg     Physical Exam: General:  No acute distress  Head:  Normocephalic, atraumatic. Moist oral mucosal membranes  Eyes:  Anicteric  Neck:  Supple  Lungs:   Clear to auscultation, normal effort  Heart:  S1S2 no rubs  Abdomen:   Soft, nontender, bowel sounds present  Extremities:  peripheral edema.  Neurologic: Poorly responsive  Skin:  No lesions  Access:     Basic Metabolic Panel: Recent Labs  Lab 12/28/22 0322 12/29/22 0204 12/30/22 0440 12/31/22 0637 01/01/23 0557 01/01/23 0600  NA 139 137 138 142  --  143  K 4.7 4.2 4.5 4.9  --  4.7  CL 107 104 104 104  --  105  CO2 18* 20* 25 26  --  26  GLUCOSE 150* 244* 217* 226*  --  218*  BUN 64* 64* 69* 79*  --  92*  CREATININE 2.07* 1.70* 1.75* 1.96*  --  1.92*  CALCIUM 8.6* 8.6* 8.6* 8.8*  --  8.6*  MG 2.6* 2.3 2.3 2.3  --  2.3  PHOS 4.5 2.5 2.4* 2.8 3.4  --    GFR: Estimated Creatinine Clearance: 30.3 mL/min (A) (by C-G formula based on SCr of 1.92 mg/dL (H)).  Liver Function Tests: Recent Labs  Lab 12/26/22 0440 12/27/22 0416 12/28/22 0322 01/01/23 0600  AST  --   --   --  52*  ALT  --   --   --  33  ALKPHOS  --   --   --  106  BILITOT  --   --   --  1.0  PROT  --   --   --  6.7  ALBUMIN 2.6* 2.2* 2.1* 1.8*   No results for input(s): "LIPASE", "AMYLASE" in the last 168 hours. Recent Labs  Lab 12/29/22 1113 01/01/23 1207  AMMONIA 44* 57*    CBC: Recent Labs  Lab 12/28/22 0322  12/29/22 0204 12/30/22 0440 12/31/22 0637 01/01/23 0600  WBC 4.7 4.9 8.0 13.5* 11.7*  HGB 9.8* 9.9* 9.2* 10.1* 9.5*  HCT 30.9* 30.2* 28.9* 30.9* 29.4*  MCV 102.7* 99.7 100.0 99.4 99.7  PLT 152 175 210 290 305     HbA1C: Hgb A1c MFr Bld  Date/Time Value Ref Range Status  11/20/2022 05:04 AM 7.4 (H) 4.8 - 5.6 % Final    Comment:    (NOTE)         Prediabetes: 5.7 - 6.4         Diabetes: >6.4         Glycemic control for adults with diabetes: <7.0   02/15/2022 05:57 AM 5.9 (H) 4.8 - 5.6 % Final    Comment:    (NOTE)         Prediabetes: 5.7 - 6.4         Diabetes: >6.4         Glycemic control for adults with diabetes: <7.0  Urinalysis: No results for input(s): "COLORURINE", "LABSPEC", "PHURINE", "GLUCOSEU", "HGBUR", "BILIRUBINUR", "KETONESUR", "PROTEINUR", "UROBILINOGEN", "NITRITE", "LEUKOCYTESUR" in the last 72 hours.  Invalid input(s): "APPERANCEUR"    Imaging: ECHOCARDIOGRAM COMPLETE  Result Date: 12/31/2022    ECHOCARDIOGRAM REPORT   Patient Name:   Brett Blake Date of Exam: 12/31/2022 Medical Rec #:  [id number]         Height:       64.0 in Accession #:    [id number]        Weight:       189.4 lb Date of Birth:  1941-04-08        BSA:          1.912 m Patient Age:    82 years          BP:           156/77 mmHg Patient Gender: M                 HR:           102 bpm. Exam Location:  [facility] Procedure: 2D Echo Indications:     Elevated Troponin  History:         Patient has no prior history of Echocardiogram examinations.  Sonographer:     Kathlen Brunswick RDCS Referring Phys:  [id number] Teressa Lower Diagnosing Phys: Neoma Laming  Sonographer Comments: Echo performed with patient supine and on artificial respirator and suboptimal subcostal window. Image acquisition challenging due to respiratory motion. IMPRESSIONS  1. Left ventricular ejection fraction, by estimation, is 60 to 65%. The left ventricle has normal function. The left ventricle has no regional wall motion  abnormalities. There is mild left ventricular hypertrophy. Left ventricular diastolic parameters are consistent with Grade I diastolic dysfunction (impaired relaxation).  2. Right ventricular systolic function is normal. The right ventricular size is normal.  3. Left atrial size was mildly dilated.  4. Right atrial size was mildly dilated.  5. The mitral valve is normal in structure. Trivial mitral valve regurgitation. No evidence of mitral stenosis.  6. The aortic valve is normal in structure. Aortic valve regurgitation is not visualized. No aortic stenosis is present.  7. The inferior vena cava is normal in size with greater than 50% respiratory variability, suggesting right atrial pressure of 3 mmHg. FINDINGS  Left Ventricle: Left ventricular ejection fraction, by estimation, is 60 to 65%. The left ventricle has normal function. The left ventricle has no regional wall motion abnormalities. The left ventricular internal cavity size was normal in size. There is  mild left ventricular hypertrophy. Left ventricular diastolic parameters are consistent with Grade I diastolic dysfunction (impaired relaxation). Right Ventricle: The right ventricular size is normal. No increase in right ventricular wall thickness. Right ventricular systolic function is normal. Left Atrium: Left atrial size was mildly dilated. Right Atrium: Right atrial size was mildly dilated. Pericardium: There is no evidence of pericardial effusion. Mitral Valve: The mitral valve is normal in structure. Trivial mitral valve regurgitation. No evidence of mitral valve stenosis. Tricuspid Valve: The tricuspid valve is normal in structure. Tricuspid valve regurgitation is trivial. No evidence of tricuspid stenosis. Aortic Valve: The aortic valve is normal in structure. Aortic valve regurgitation is not visualized. No aortic stenosis is present. Aortic valve peak gradient measures 9.9 mmHg. Pulmonic Valve: The pulmonic valve was normal in structure. Pulmonic  valve regurgitation is not visualized. No evidence of pulmonic stenosis. Aorta: The aortic root is normal in size and structure. Venous: The inferior vena  cava is normal in size with greater than 50% respiratory variability, suggesting right atrial pressure of 3 mmHg. IAS/Shunts: No atrial level shunt detected by color flow Doppler.  LEFT VENTRICLE PLAX 2D LVIDd:         3.70 cm     Diastology LVIDs:         2.50 cm     LV e' medial:    8.16 cm/s LV PW:         1.30 cm     LV E/e' medial:  8.7 LV IVS:        1.20 cm     LV e' lateral:   8.92 cm/s LVOT diam:     1.90 cm     LV E/e' lateral: 8.0 LV SV:         54 LV SV Index:   28 LVOT Area:     2.84 cm  LV Volumes (MOD) LV vol d, MOD A4C: 44.8 ml LV vol s, MOD A4C: 17.8 ml LV SV MOD A4C:     44.8 ml RIGHT VENTRICLE RV Basal diam:  2.30 cm RV S prime:     15.90 cm/s TAPSE (M-mode): 1.5 cm LEFT ATRIUM             Index        RIGHT ATRIUM           Index LA diam:        3.50 cm 1.83 cm/m   RA Area:     10.10 cm LA Vol (A2C):   31.2 ml 16.32 ml/m  RA Volume:   18.20 ml  9.52 ml/m LA Vol (A4C):   28.4 ml 14.86 ml/m LA Biplane Vol: 31.1 ml 16.27 ml/m  AORTIC VALVE                 PULMONIC VALVE AV Area (Vmax): 2.00 cm     PV Vmax:       1.17 m/s AV Vmax:        157.00 cm/s  PV Peak grad:  5.5 mmHg AV Peak Grad:   9.9 mmHg LVOT Vmax:      110.50 cm/s LVOT Vmean:     73.300 cm/s LVOT VTI:       0.192 m  AORTA Ao Root diam: 3.20 cm Ao Asc diam:  3.00 cm MITRAL VALVE               TRICUSPID VALVE MV Area (PHT): 5.09 cm    TR Peak grad:   51.3 mmHg MV Decel Time: 149 msec    TR Vmax:        358.00 cm/s MV E velocity: 71.30 cm/s MV A velocity: 83.30 cm/s  SHUNTS MV E/A ratio:  0.86        Systemic VTI:  0.19 m                            Systemic Diam: 1.90 cm Neoma Laming Electronically signed by Neoma Laming Signature Date/Time: 12/31/2022/12:54:05 PM    Final    DG Abd 1 View  Result Date: 12/31/2022 CLINICAL DATA:  Abdominal distension EXAM: ABDOMEN - 1 VIEW  COMPARISON:  Three days ago FINDINGS: Enteric tube with tip at the stomach and side port near the GE junction. Normal bowel gas pattern. Retrocardiac airspace disease with air bronchogram. Cholecystectomy clips. IMPRESSION: Enteric tube with tip at the stomach.  Normal bowel gas pattern. Retrocardiac consolidation/collapse. Electronically  Signed   By: Jorje Guild M.D.   On: 12/31/2022 10:31     Medications:    sodium chloride     feeding supplement (VITAL AF 1.2 CAL) 40 mL/hr at 12/31/22 0600   piperacillin-tazobactam (ZOSYN)  IV 3.375 g (01/01/23 0511)   propofol (DIPRIVAN) infusion Stopped (12/30/22 0815)    Chlorhexidine Gluconate Cloth  6 each Topical Daily   docusate  100 mg Per Tube BID   feeding supplement (PROSource TF20)  60 mL Per Tube Daily   folic acid  1 mg Per Tube Daily   free water  30 mL Per Tube Q4H   heparin injection (subcutaneous)  5,000 Units Subcutaneous Q8H   insulin aspart  0-20 Units Subcutaneous Q4H   insulin aspart  5 Units Subcutaneous Q4H   ipratropium-albuterol  3 mL Nebulization Q6H   lactulose  20 g Oral TID   multivitamin with minerals  1 tablet Per Tube Daily   mouth rinse  15 mL Mouth Rinse Q2H   pantoprazole (PROTONIX) IV  40 mg Intravenous QHS   polyethylene glycol  17 g Per Tube Daily   propranolol  60 mg Per Tube BID   thiamine (VITAMIN B1) injection  100 mg Intravenous Daily   venlafaxine  18.75 mg Per Tube BID WC   vitamin B-12  100 mcg Per Tube Daily    Assessment/ Plan:     [name] is a 82 y.o.  male with a PMHx of advanced COPD, asthma, diabetes mellitus type 2, GERD, hyperlipidemia, hypertension, obstructive sleep apnea, anxiety, who was admitted to [facility] on 12/21/2022 for evaluation of severe tremors.  Patient has history of alcohol intake nightly.  He developed significant delirium tremens upon admission and subsequently intubated for airway protection.    #1: Acute kidney injury: Patient with acute kidney injury most  likely secondary to acute tubular necrosis due to hemodynamic compromise.  Renal sonogram was negative.  Renal indices are stable at this time.   #2: Acute respiratory failure: Difficulty weaning.  Off of sedation.  Protocols as per critical care.   #3: Diabetes: Continue insulin as per protocol.   #4: Sepsis: Patient has been on Zosyn and will continue the same.   Overall prognosis is poor.  Will continue to follow.    LOS: [place], [facility] 1/7/20241:08 PM

## 2023-01-01 NOTE — Progress Notes (Incomplete)
[place] for Electrolyte Monitoring and Replacement   Recent Labs: Potassium (mmol/L)  Date Value  01/01/2023 4.7   Magnesium (mg/dL)  Date Value  01/01/2023 2.3   Calcium (mg/dL)  Date Value  01/01/2023 8.6 (L)   Albumin (g/dL)  Date Value  01/01/2023 1.8 (L)   Phosphorus (mg/dL)  Date Value  12/31/2022 2.8   Sodium (mmol/L)  Date Value  01/01/2023 143   Corrected Ca: 9.9 mg/dL  Assessment 82 y.o. male presenting with seizure. PMH significant for AUD, HTN, HLD, DM, COPD, dementia, depression with anxiety, pseudoseizure, former smoker. Pharmacy has been consulted to monitor and replace electrolytes.  Diet: Vital AF at 40 mL/hr + free water flushes 30 mL per tube q4h  Goal of Therapy: Electrolytes WNL  Plan:  No electrolyte replacement warranted for today Recheck electrolytes with AM labs  Thank you for allowing pharmacy to be a part of this patient's care.  Vallery Sa, PharmD, BCPS 01/01/2023 7:03 AM

## 2023-01-02 ENCOUNTER — Inpatient Hospital Stay: Payer: Medicare Other

## 2023-01-02 DIAGNOSIS — R4182 Altered mental status, unspecified: Secondary | ICD-10-CM | POA: Diagnosis not present

## 2023-01-02 DIAGNOSIS — R569 Unspecified convulsions: Secondary | ICD-10-CM | POA: Diagnosis not present

## 2023-01-02 LAB — GLUCOSE, CAPILLARY
Glucose-Capillary: 205 mg/dL — ABNORMAL HIGH (ref 70–99)
Glucose-Capillary: 197 mg/dL — ABNORMAL HIGH (ref 70–99)
Glucose-Capillary: 198 mg/dL — ABNORMAL HIGH (ref 70–99)
Glucose-Capillary: 197 mg/dL — ABNORMAL HIGH (ref 70–99)
Glucose-Capillary: 166 mg/dL — ABNORMAL HIGH (ref 70–99)
Glucose-Capillary: 147 mg/dL — ABNORMAL HIGH (ref 70–99)

## 2023-01-02 LAB — TRIGLYCERIDES: Triglycerides: 475 mg/dL — ABNORMAL HIGH (ref <150)

## 2023-01-02 LAB — CBC WITH DIFFERENTIAL/PLATELET
Abs Immature Granulocytes: 0.28 10*3/uL — ABNORMAL HIGH (ref 0.00–0.07)
Basophils Absolute: 0.1 10*3/uL (ref 0.0–0.1)
Basophils Relative: 0 %
Eosinophils Absolute: 0.1 10*3/uL (ref 0.0–0.5)
Eosinophils Relative: 1 %
HCT: 29.9 % — ABNORMAL LOW (ref 39.0–52.0)
Hemoglobin: 9.5 g/dL — ABNORMAL LOW (ref 13.0–17.0)
Immature Granulocytes: 3 %
Lymphocytes Relative: 4 %
Lymphs Abs: 0.5 10*3/uL — ABNORMAL LOW (ref 0.7–4.0)
MCH: 32.1 pg (ref 26.0–34.0)
MCHC: 31.8 g/dL (ref 30.0–36.0)
MCV: 101 fL — ABNORMAL HIGH (ref 80.0–100.0)
Monocytes Absolute: 0.7 10*3/uL (ref 0.1–1.0)
Monocytes Relative: 6 %
Neutro Abs: 9.6 10*3/uL — ABNORMAL HIGH (ref 1.7–7.7)
Neutrophils Relative %: 86 %
Platelets: 336 10*3/uL (ref 150–400)
RBC: 2.96 MIL/uL — ABNORMAL LOW (ref 4.22–5.81)
RDW: 19.9 % — ABNORMAL HIGH (ref 11.5–15.5)
WBC: 11.3 10*3/uL — ABNORMAL HIGH (ref 4.0–10.5)
nRBC: 2.2 % — ABNORMAL HIGH (ref 0.0–0.2)

## 2023-01-02 LAB — CULTURE, RESPIRATORY W GRAM STAIN: Gram Stain: NONE SEEN

## 2023-01-02 LAB — MAGNESIUM: Magnesium: 2.4 mg/dL (ref 1.7–2.4)

## 2023-01-02 LAB — BASIC METABOLIC PANEL
Anion gap: 13 (ref 5–15)
BUN: 98 mg/dL — ABNORMAL HIGH (ref 8–23)
CO2: 28 mmol/L (ref 22–32)
Calcium: 8.8 mg/dL — ABNORMAL LOW (ref 8.9–10.3)
Chloride: 102 mmol/L (ref 98–111)
Creatinine, Ser: 1.93 mg/dL — ABNORMAL HIGH (ref 0.61–1.24)
GFR, Estimated: 35 mL/min — ABNORMAL LOW (ref >60)
Glucose, Bld: 228 mg/dL — ABNORMAL HIGH (ref 70–99)
Potassium: 4.1 mmol/L (ref 3.5–5.1)
Sodium: 143 mmol/L (ref 135–145)

## 2023-01-02 LAB — PHOSPHORUS: Phosphorus: 3.4 mg/dL (ref 2.5–4.6)

## 2023-01-02 MED ORDER — MIDAZOLAM HCL 2 MG/2ML IJ SOLN
INTRAMUSCULAR | Status: AC
  Filled 2023-01-02: qty 2

## 2023-01-02 MED ORDER — FENTANYL CITRATE PF 50 MCG/ML IJ SOSY
50.0000 ug | PREFILLED_SYRINGE | Freq: Once | INTRAMUSCULAR | Status: AC
  Administered 2023-01-02: 50 ug via INTRAVENOUS

## 2023-01-02 MED ORDER — VITAL AF 1.2 CAL PO LIQD
1000.0000 mL | ORAL | Status: DC
  Administered 2023-01-03 – 2023-01-06 (×5): 1000 mL

## 2023-01-02 MED ORDER — HEPARIN SODIUM (PORCINE) 1000 UNIT/ML IJ SOLN: 2800.0000 [IU] | Freq: Two times a day (BID) | INTRAMUSCULAR | Status: DC | PRN

## 2023-01-02 MED ORDER — INSULIN ASPART 100 UNIT/ML IJ SOLN
8.0000 [IU] | INTRAMUSCULAR | Status: DC
  Administered 2023-01-02 – 2023-01-05 (×11): 8 [IU] via SUBCUTANEOUS
  Filled 2023-01-02 (×11): qty 1

## 2023-01-02 MED ORDER — MIDAZOLAM HCL 2 MG/2ML IJ SOLN
2.0000 mg | Freq: Once | INTRAMUSCULAR | Status: AC
  Administered 2023-01-02: 2 mg via INTRAVENOUS

## 2023-01-02 NOTE — Procedures (Addendum)
Patient Name: Brett Blake  MRN: [id number]  Epilepsy Attending: Lora Havens  Referring Physician/Provider: Teressa Lower, NP  Date: 01/02/2023 Duration: 33.19 mins  Patient history:  82 year old man past history of alcohol abuse COPD hypertension diabetes coronary artery disease history of nonepileptic spells memory changes presenting for complaints of seizures.  EEG to evaluate for seizure.  Level of alertness: lethargic   AEDs during EEG study:   Technical aspects: This EEG study was done with scalp electrodes positioned according to the 10-20 International system of electrode placement. Electrical activity was reviewed with band pass filter of 1-'70Hz'$ , sensitivity of 7 uV/mm, display speed of 36m/sec with a '60Hz'$  notched filter applied as appropriate. EEG data were recorded continuously and digitally stored.  Video monitoring was available and reviewed as appropriate.  Description: EEG showed continuous generalized 3 to 6 Hz theta-delta slowing. Hyperventilation and photic stimulation were not performed.     ABNORMALITY - Continuous slow, generalized  IMPRESSION: This study is suggestive of moderate diffuse encephalopathy, nonspecific etiology. No seizures or epileptiform discharges were seen throughout the recording.  Brett Blake [name]

## 2023-01-02 NOTE — Progress Notes (Signed)
NAME:  Brett Blake, MRN:  [id number], DOB:  07/21/1941, LOS: 11 ADMISSION DATE:  12/12/2022, CONSULTATION DATE: 11/30/2022 REFERRING MD: Dr. Blaine Hamper, CHIEF COMPLAINT: AMS   History of Present Illness:  This is an 81 yo male with hx of ETOH Abuse who presented to [facility] ER on 12/28 via EMS from home following a possible seizure activity the morning of 12/28.  He has a hx of non epileptic seizures, however he does not take antiepileptics.  He reported he has not had a seizure in a long time.  All hx obtained from pts chart and daughter.   ED Course Upon arrival to the ER pt noted to have severe tremors.  Pt reported he drinks 1 small alcoholic beverage every night prior to going to bed.  However, pts wife reported he drank hard liquor heavily over the past several days and abruptly stopped drinking 2 days ago.  He also complained of an upset stomach.  ER lab results revealed K+ 3.1/glucose 138/calcium 8.3/albumin 3.4/AST 81/ALT 62/total protein 6.4/magnesium 1.6/wbc 3.9/hgb 12.1/platelets 125.  COVID-19/Influenza A&B negative.  Pt had a CIWA score of 4, he received 1 mg of iv ativan.  Hospitalist initially contacted for hospital admission.  However, pt became increasingly agitated and combative despite receiving a total of 3 mg of iv ativan and 3 mg of IM haldol.  Security called to bedside.  PCCM team consulted due to pt requiring precedex gtt.  However, despite precedex gtt at maximal dose; additional 2 mg of iv ativan, 12.5 mg of iv fentanyl, and 2.5 mg of iv haldol pt remained combative/agitated requiring mechanical intubation.  Pt admitted to ICU per PCCM team for additional workup and treatment.    CT Head 12/28:  No evidence of acute intracranial abnormality. Mild chronic small vessel ischemic disease. CXR 12/28: No active cardiopulmonary disease.   Please see "significant hospital events" section below for full detailed hospital course.  Pertinent  Medical History  Anxiety  Arthritis   Asthma  Colon Polyps  COPD Depression  Type II Diabetes Mellitus Difficult Intubation  GERD  Hypercholesteremia  HLD HTN  Hyponatremia  OSA   Micro Data:  12/28: SARS-CoV-2 and influenza PCR>> negative 12/28: MRSA PCR>> negative 1/5: Tracheal aspirate>>ENTEROBACTER CLOACAE, STAPHYLOCOCCUS AUREUS  1/6: MRSA PCR>>negative  Antimicrobials:   Anti-infectives (From admission, onward)    Start     Dose/Rate Route Frequency Ordered Stop   12/30/22 2200  piperacillin-tazobactam (ZOSYN) IVPB 3.375 g        3.375 g 12.5 mL/hr over 240 Minutes Intravenous Every 8 hours 12/30/22 [place] [facility] Events: Including procedures, antibiotic start and stop dates in addition to other pertinent events   12/28: Pt admitted with possible seizure activity suspect secondary to severe ETOH withdrawal requiring mechanical intubation  12/28: CT Head>No evidence of acute intracranial abnormality. Mild chronic small vessel ischemic disease.  12/23/22- patient still having seizures despite propofol, the seizures do respond to Ativan.  Pharmacy is monitoring electrolytes.  He is weaning off levophed.  He will start feeds today.  ABG with res alkalosis and RR was reduced.  12/24/22- patient improved, dcd phenobarb and wake up assessment was very poor today with onset of tachyarrythmia in 140s and seizure like activity upon reduction of sedation.  12/25/22-  patient is critically ill, he is not tolerating reduction of sedation with emergence of seizure like activity and tachyarrythmia. Wife and nephew at bedside we discussed short term plan. Today  we have transitioned to herparin from lovenox due to worsening renal impairment.  12/26/22- patient was not able to tolerate reduction in sedation with devt of severe hypertension and tachyarrythmia 12/27/22:  AKI slowly improving.  Start Precedex to assist with WUA. 12/28/22: MR Brain WO Contrast>no acute or reversible finding. Generalized age   related atrophy. Minimal small vessel change of the cerebral hemispheric white matter, less than often seen at this age. Chronic nonprogressive benign type  calcification within the posterolateral left thalamus. Sinus mucosal thickening and  layering fluid as often seen in patients with mechanical ventilation. Mastoid effusions as often seen in patients with mechanical ventilation.  12/29/22: Pt failed WUA  12/30/22: EEG negative for electrographic seizures, however severe diffuse slowing indicative of global cerebral dysfunction, medication effect, or both  12/30/22: No acute events overnight. Pt remains mechanically intubated FiO2 35%. Will perform WUA and possible SBT  01/01/23: Pt with continued poor neurological exam remains unresponsive despite not receiving sedating medications since 12/30/22.  CT Head and EEG negative.  Remains on minimal mechanical ventilator settings, however neurological exam impeding extubation 01/02/23:   Neuro exam unchanged which is precluding extubation.  Consult Neurology,  IR to evaluate for LP.  Consult Palliative Care for [place].  Interim History / Subjective:  -No significant events noted overnight -Afebrile, hemodynamically stable, no vasopressors -On minimal vent support (35% FiO2), mental status currently precluding extubation ~ will exercise in PSV as tolerates -Has been off sedation since 1/5, Neuro exam remains poor and unchanged ~ CT Head, MRI, and EEG all negative ~ will consult Neuro -IR to evaluate for LP to rule out CNS infection vs. CNS autoimmune Inflammatory process -Tracheal aspirate growing ENTEROBACTER CLOACAE, STAPHYLOCOCCUS AUREUS ~ on Zosyn -Creatinine stable at 1.93 from 1.85, BUN unchanged at 98, UOP 950 cc last 24 hrs (net + 3.9 L) ~ Nephrology is following  Objective   Blood pressure (!) 182/67, pulse 78, temperature 99.1 F (37.3 C), temperature source Oral, resp. rate (!) 34, height '5\' 4"'$  (1.626 m), weight 83.4 kg, SpO2 95 %.    Vent Mode:  PRVC FiO2 (%):  [40 %] 40 % Set Rate:  [16 bmp] 16 bmp Vt Set:  [400 mL] 400 mL PEEP:  [5 cmH20] 5 cmH20 Plateau Pressure:  [12 cmH20-14 cmH20] 14 cmH20   Intake/Output Summary (Last 24 hours) at 01/02/2023 0828 Last data filed at 01/02/2023 0600 Gross per 24 hour  Intake 834.56 ml  Output 950 ml  Net -115.44 ml    Filed Weights   12/30/22 0447 12/31/22 0500 01/02/23 0500  Weight: 86.2 kg 85.9 kg 83.4 kg    Examination: General: Acutely on chronically-ill appearing male, mechanically ventilated, synchronous with the vent.  In NAD HENT: Supple, difficult to assess for JVD due to body habitus, orally intubated Lungs: Coarse breath sounds throughout, no wheezing or rhonchi noted, even, non labored, synchronous with the vent Cardiovascular: NSR, s1s2, no r/g, 2+ radial/1+ distal, 1+ generalized edema  Abdomen: +BS x4, obese, soft, distended, rectal tube in place with stool present  Extremities: Normal bulk and tone, no deformities Neuro: Off sedation, not following commands, corneal and gag reflexes present, PERRL  GU: Indwelling foley catheter draining yellow urine  Resolved Hospital Problem list     Assessment & Plan:   Intubation and mechanical ventilation for airway protection in the setting of ETOH withdrawal  Hx: OSA, COPD, Asthma, and Difficult Intubation  - Full vent support, implement lung protective strategies - Plateau pressures less than 30  cm H20 - Wean FiO2 & PEEP as tolerated to maintain O2 sats 88 to 92% - Follow intermittent Chest X-ray & ABG as needed - Spontaneous Breathing Trials when respiratory parameters met and mental status permits - Continue VAP Bundle - Bronchodilators & Pulmicort nebs  Hypertension Tachycardia, due to DT's Hx: Hypercholesteremia   Echo 12/31/22: EF 60 to 54%; grade I diastolic dysfunction  - Continuous cardiac monitoring - Continue propranolol and prn hydralazine for bp management  - Continue outpatient rosuvastatin   Acute  kidney injury secondary to ATN - Renal US negative - Trend BMP  - Replace electrolytes as indicated  - Monitor UOP  - Avoid nephrotoxic medications - Nephrology following, appreciate input ~ no acute need for dialysis  LLL pneumonia (ENTEROBACTER CLOACAE, STAPHYLOCOCCUS AUREUS) Fever -Monitor fever curve -Trend WBC's & Procalcitonin -Follow cultures as above -Continue empiric Zosyn pending cultures & sensitivities  Type II diabetes mellitus  - CBG's q4h; Target range of 140 to 180 - SSI and scheduled novolog  - Follow ICU Hypo/Hyperglycemia protocol  Possible seizure activity in the setting of ETOH withdrawal  Delirium Tremens Acute Metabolic Encephalopathy  Sedation needs in setting of mechanical ventilation CT Head on admission: negative for acute intracranial abnormality EEG 12/23/22: suggestive of severe to profound diffuse encephalopathy, nonspecific etiology but likely related to sedation. No seizures or epileptiform discharges were seen throughout the recording.  MRI 1/3: with no acute or reversible finding. Chronic age related changes EEG 1/5: negative for seizure activity  -Treatment of metabolic derangements as outlined above - Maintain a RASS goal of 0  - Currently off sedation - Avoid sedating medications as able - Daily wake up assessment  - Seizure precautions  - CIWA protocol - Continue thiamine, folic acid, and MVI - Continue lactulose: goal 1L of stool daily  - IR consulted to evaluate for LP to rule out CNS infection vs. CNS autoimmune Inflammatory process -Consult Neurology, appreciate input    Pt is critically ill.  Prognosis is guarded and overall long term prognosis is poor.  Recommend DNR status.  Palliative Care consulted to assist with [place].   Best Practice (right click and "Reselect all SmartList Selections" daily)   Diet/type: NPO, tube feeds DVT prophylaxis: Heparin SQ GI prophylaxis: H2B Lines: N/A Foley:  Yes, and it is still  needed Code Status:  full code Last date of multidisciplinary goals of care discussion [01/02/23]  01/02/23: Will update family when they arrive at bedside   Labs   CBC: Recent Labs  Lab 12/29/22 0204 12/30/22 0440 12/31/22 0637 01/01/23 0600 01/02/23 0442  WBC 4.9 8.0 13.5* 11.7* 11.3*  NEUTROABS  --   --   --   --  9.6*  HGB 9.9* 9.2* 10.1* 9.5* 9.5*  HCT 30.2* 28.9* 30.9* 29.4* 29.9*  MCV 99.7 100.0 99.4 99.7 101.0*  PLT 175 210 290 305 336     Basic Metabolic Panel: Recent Labs  Lab 12/30/22 0440 12/31/22 0637 01/01/23 0557 01/01/23 0600 01/01/23 1756 01/02/23 0442  NA 138 142  --  143 141 143  K 4.5 4.9  --  4.7 4.1  4.1 4.1  CL 104 104  --  105 102 102  CO2 25 26  --  '26 26 28  '$ GLUCOSE 217* 226*  --  218* 274* 228*  BUN 69* 79*  --  92* 98* 98*  CREATININE 1.75* 1.96*  --  1.92* 1.85* 1.93*  CALCIUM 8.6* 8.8*  --  8.6* 8.7* 8.8*  MG  2.3 2.3  --  2.3 2.4 2.4  PHOS 2.4* 2.8 3.4  --  3.8 3.4    GFR: Estimated Creatinine Clearance: 29.7 mL/min (A) (by C-G formula based on SCr of 1.93 mg/dL (H)). Recent Labs  Lab 12/30/22 0436 12/30/22 0440 12/31/22 0637 01/01/23 0600 01/02/23 0442  PROCALCITON 1.05  --  1.63  --   --   WBC  --  8.0 13.5* 11.7* 11.3*     Liver Function Tests: Recent Labs  Lab 12/27/22 0416 12/28/22 0322 01/01/23 0600  AST  --   --  52*  ALT  --   --  33  ALKPHOS  --   --  106  BILITOT  --   --  1.0  PROT  --   --  6.7  ALBUMIN 2.2* 2.1* 1.8*    No results for input(s): "LIPASE", "AMYLASE" in the last 168 hours. Recent Labs  Lab 12/29/22 1113 01/01/23 1207  AMMONIA 44* 57*     ABG    Component Value Date/Time   PHART 7.44 12/31/2022 0855   PCO2ART 45 12/31/2022 0855   PO2ART 64 (L) 12/31/2022 0855   HCO3 30.6 (H) 12/31/2022 0855   ACIDBASEDEF 3.3 (H) 12/29/2022 0206   O2SAT 94.2 12/31/2022 0855     Coagulation Profile: Recent Labs  Lab 01/01/23 1623  INR 1.3*    Cardiac Enzymes: No results for  input(s): "CKTOTAL", "CKMB", "CKMBINDEX", "TROPONINI" in the last 168 hours.   HbA1C: Hgb A1c MFr Bld  Date/Time Value Ref Range Status  11/20/2022 05:04 AM 7.4 (H) 4.8 - 5.6 % Final    Comment:    (NOTE)         Prediabetes: 5.7 - 6.4         Diabetes: >6.4         Glycemic control for adults with diabetes: <7.0   02/15/2022 05:57 AM 5.9 (H) 4.8 - 5.6 % Final    Comment:    (NOTE)         Prediabetes: 5.7 - 6.4         Diabetes: >6.4         Glycemic control for adults with diabetes: <7.0     CBG: Recent Labs  Lab 01/01/23 1959 01/01/23 2025 01/01/23 2349 01/02/23 0353 01/02/23 0750  GLUCAP 238* 259* 185* 205* 197*     Review of Systems:   Unable to assess pt mechanically intubated, AMS, critical illness  Past Medical History:  He,  has a past medical history of Anxiety, Arthritis, Asthma, Colon polyps, COPD (chronic obstructive pulmonary disease) ([place]), Depression, Diabetes ([place]), Difficult intubation, GERD (gastroesophageal reflux disease), High cholesterol, Hyperlipidemia, Hypertension, Hyponatremia (02/23/2022), and Sleep apnea.   Surgical History:   Past Surgical History:  Procedure Laterality Date   BRONCHOSCOPY     CARDIAC CATHETERIZATION Left 11/04/2016   Procedure: Left Heart Cath and Coronary Angiography;  Surgeon: Teodoro Spray, MD;  Location: [place] [facility];  Service: Cardiovascular;  Laterality: Left;   CARDIAC CATHETERIZATION     CHOLECYSTECTOMY N/A 05/09/2019   Procedure: LAPAROSCOPIC CHOLECYSTECTOMY WITH INTRAOPERATIVE CHOLANGIOGRAM;  Surgeon: Jules Husbands, MD;  Location: [facility];  Service: General;  Laterality: N/A;   COLOSTOMY     IR KYPHO LUMBAR INC FX REDUCE BONE BX UNI/BIL CANNULATION INC/IMAGING  11/10/2022   IR RADIOLOGIST EVAL & MGMT  11/08/2022   LEFT HEART CATH AND CORONARY ANGIOGRAPHY N/A 12/14/2021   Procedure: LEFT HEART CATH AND CORONARY ANGIOGRAPHY;  Surgeon: Nehemiah Massed,  Everlean Cherry, MD;  Location: [place] [facility];  Service:  Cardiovascular;  Laterality: N/A;   ROTATOR CUFF REPAIR Left    THUMB ARTHROSCOPY  2015   TONSILLECTOMY  1952   TONSILLECTOMY       Social History:   reports that he has quit smoking. His smoking use included cigarettes. He has a 15.00 pack-year smoking history. He has never used smokeless tobacco. He reports current alcohol use of about 1.0 - 2.0 standard drink of alcohol per week. He reports that he does not use drugs.   Family History:  His family history includes Heart attack in his mother; Leukemia in his brother; Prostate cancer in his brother and father.   Allergies Allergies  Allergen Reactions   Ace Inhibitors Cough   Metoprolol Diarrhea    Patient has been tolerating Propranolol 40 mg twice daily since at least 05/2022.   Nsaids Rash   Sulfa Antibiotics Rash     Home Medications  Prior to Admission medications   Medication Sig Start Date End Date Taking? Authorizing Provider  albuterol (ACCUNEB) 1.25 MG/3ML nebulizer solution Take 3 mLs by nebulization every 6 (six) hours as needed for wheezing. 11/20/22  Yes Wieting, Richard, MD  ALPRAZolam Duanne Moron) 0.5 MG tablet Take 0.5 mg by mouth daily as needed for anxiety. 10/06/22 10/06/23 Yes [provider]  aspirin EC 81 MG tablet Take 81 mg by mouth daily. Swallow whole.   Yes [provider]  diltiazem (CARDIZEM CD) 240 MG 24 hr capsule Take 240 mg by mouth every morning. 12/07/21  Yes [provider]  donepezil (ARICEPT) 5 MG tablet Take 5 mg by mouth at bedtime. 05/10/22  Yes [provider]  fluticasone (FLONASE) 50 MCG/ACT nasal spray Place 1 spray into both nostrils daily as needed for allergies or rhinitis.   Yes [provider]  folic acid (FOLVITE) 1 MG tablet Take 1 tablet (1 mg total) by mouth daily. 03/01/22  Yes Wieting, Richard, MD  gabapentin (NEURONTIN) 300 MG capsule Take 1 capsule (300 mg total) by mouth at bedtime. 02/28/22  Yes Wieting, Richard, MD  melatonin 5 MG TABS  Take 1 tablet (5 mg total) by mouth at bedtime. 02/28/22  Yes Wieting, Richard, MD  nitroGLYCERIN (NITROSTAT) 0.4 MG SL tablet Place 0.4 mg under the tongue every 5 (five) minutes as needed for chest pain.   Yes [provider]  omeprazole (PRILOSEC) 40 MG capsule Take 40 mg by mouth daily. 04/20/22  Yes [provider]  ondansetron (ZOFRAN-ODT) 4 MG disintegrating tablet Take 1 tablet (4 mg total) by mouth every 8 (eight) hours as needed for nausea or vomiting. 06/11/22  Yes Carrie Mew, MD  propranolol (INDERAL) 40 MG tablet Take 40 mg by mouth 2 (two) times daily. 11/07/22  Yes [provider]  QUEtiapine (SEROQUEL) 25 MG tablet One tab po qhs (can take extra dose if not sleeping) 02/28/22  Yes Wieting, Richard, MD  rosuvastatin (CRESTOR) 5 MG tablet Take 5 mg by mouth daily. 05/10/22  Yes [provider]  SYMBICORT 80-4.5 MCG/ACT inhaler Inhale 2 puffs into the lungs 2 (two) times daily as needed.  05/19/20  Yes [provider]  venlafaxine XR (EFFEXOR-XR) 37.5 MG 24 hr capsule Take 37.5 mg by mouth daily. 12/07/21  Yes [provider]  azithromycin (ZITHROMAX) 250 MG tablet One tab po daily for three days Patient not taking: Reported on 12/06/2022 11/21/22   Loletha Grayer, MD  guaiFENesin (ROBITUSSIN) 100 MG/5ML liquid Take  5 mLs by mouth every 4 (four) hours as needed for cough or to loosen phlegm. Patient not taking: Reported on 12/20/2022 02/28/22   Loletha Grayer, MD  polyethylene glycol (MIRALAX / GLYCOLAX) 17 g packet Take 17 g by mouth daily as needed for moderate constipation. Patient not taking: Reported on 11/08/2022 02/28/22   Loletha Grayer, MD     Scheduled Meds:  Chlorhexidine Gluconate Cloth  6 each Topical Daily   docusate  100 mg Per Tube BID   feeding supplement (PROSource TF20)  60 mL Per Tube Daily   folic acid  1 mg Per Tube Daily   free water  30 mL Per Tube Q4H   heparin injection (subcutaneous)  5,000 Units  Subcutaneous Q8H   insulin aspart  0-20 Units Subcutaneous Q4H   insulin aspart  5 Units Subcutaneous Q4H   ipratropium-albuterol  3 mL Nebulization Q6H   lactulose  20 g Oral TID   multivitamin with minerals  1 tablet Per Tube Daily   mouth rinse  15 mL Mouth Rinse Q2H   pantoprazole (PROTONIX) IV  40 mg Intravenous QHS   polyethylene glycol  17 g Per Tube Daily   thiamine (VITAMIN B1) injection  100 mg Intravenous Daily   venlafaxine  18.75 mg Per Tube BID WC   vitamin B-12  100 mcg Per Tube Daily   Continuous Infusions:  sodium chloride     feeding supplement (VITAL AF 1.2 CAL) 40 mL/hr at 12/31/22 0600   piperacillin-tazobactam (ZOSYN)  IV 3.375 g (01/02/23 0523)   propofol (DIPRIVAN) infusion Stopped (12/30/22 0815)   PRN Meds:.albuterol, fentaNYL (SUBLIMAZE) injection, hydrALAZINE, mouth rinse   Critical care time:  40 minutes    Darel Hong, AGACNP-BC [facility] Prefer epic messenger for cross cover needs If after hours, please call E-link

## 2023-01-02 NOTE — Progress Notes (Signed)
Pharmacy Antibiotic Note  Brett Blake is a 82 y.o. male admitted on 12/15/2022 with pneumonia. PMH significant for AUD, HTN, HLD, DM, COPD, dementia, depression with anxiety, pseudoseizure, former smoker. Pharmacy has been consulted for Zosyn dosing.  Plan: Day 4 of antibiotics Continue Zosyn 3.375 g IV Q8H Continue to monitor renal function and follow culture results   Height: '5\' 4"'$  (162.6 cm) Weight: 83.4 kg (183 lb 13.8 oz) IBW/kg (Calculated) : 59.2  Temp (24hrs), Avg:98.3 F (36.8 C), Min:97.7 F (36.5 C), Max:99.1 F (37.3 C)  Recent Labs  Lab 12/29/22 0204 12/30/22 0440 12/31/22 0637 01/01/23 0600 01/01/23 1756 01/02/23 0442  WBC 4.9 8.0 13.5* 11.7*  --  11.3*  CREATININE 1.70* 1.75* 1.96* 1.92* 1.85* 1.93*     Estimated Creatinine Clearance: 29.7 mL/min (A) (by C-G formula based on SCr of 1.93 mg/dL (H)).    Allergies  Allergen Reactions   Ace Inhibitors Cough   Metoprolol Diarrhea    Patient has been tolerating Propranolol 40 mg twice daily since at least 05/2022.   Nsaids Rash   Sulfa Antibiotics Rash    Antimicrobials this admission: 1/5 Zosyn >>     Dose adjustments this admission:  Microbiology results: 12/28 MRSA PCR: negative  1/5 Resp Cx: E. Cloacae (R- cefazolin), MSSA (R-clinda, erythro) 1/6 MRSA PCR: negative  Thank you for allowing pharmacy to be a part of this patient's care.  Gretel Acre, PharmD PGY1 Pharmacy Resident 01/02/2023 1:46 PM

## 2023-01-02 NOTE — Progress Notes (Signed)
[facility]  PROGRESS NOTE   Subjective:   Remains unresponsive on vent, 40%  Objective:  Vital signs: Blood pressure (!) 173/73, pulse 85, temperature 99.1 F (37.3 C), temperature source Oral, resp. rate (!) 30, height '5\' 4"'$  (1.626 m), weight 83.4 kg, SpO2 95 %.  Intake/Output Summary (Last 24 hours) at 01/02/2023 1133 Last data filed at 01/02/2023 0600 Gross per 24 hour  Intake 834.56 ml  Output 675 ml  Net 159.56 ml    Filed Weights   12/30/22 0447 12/31/22 0500 01/02/23 0500  Weight: 86.2 kg 85.9 kg 83.4 kg     Physical Exam: General:  No acute distress  Head:  Normocephalic, atraumatic. Moist oral mucosal membranes  Eyes:  Anicteric  Lungs:   Clear to auscultation, normal effort  Heart:  S1S2 no rubs  Abdomen:   Soft, nontender, bowel sounds present  Extremities:  Trace peripheral edema.  Neurologic: Poorly responsive  Skin:  No lesions  Access: None    Basic Metabolic Panel: Recent Labs  Lab 12/30/22 0440 12/31/22 0637 01/01/23 0557 01/01/23 0600 01/01/23 1756 01/02/23 0442  NA 138 142  --  143 141 143  K 4.5 4.9  --  4.7 4.1  4.1 4.1  CL 104 104  --  105 102 102  CO2 25 26  --  '26 26 28  '$ GLUCOSE 217* 226*  --  218* 274* 228*  BUN 69* 79*  --  92* 98* 98*  CREATININE 1.75* 1.96*  --  1.92* 1.85* 1.93*  CALCIUM 8.6* 8.8*  --  8.6* 8.7* 8.8*  MG 2.3 2.3  --  2.3 2.4 2.4  PHOS 2.4* 2.8 3.4  --  3.8 3.4    GFR: Estimated Creatinine Clearance: 29.7 mL/min (A) (by C-G formula based on SCr of 1.93 mg/dL (H)).  Liver Function Tests: Recent Labs  Lab 12/27/22 0416 12/28/22 0322 01/01/23 0600  AST  --   --  52*  ALT  --   --  33  ALKPHOS  --   --  106  BILITOT  --   --  1.0  PROT  --   --  6.7  ALBUMIN 2.2* 2.1* 1.8*    No results for input(s): "LIPASE", "AMYLASE" in the last 168 hours. Recent Labs  Lab 12/29/22 1113 01/01/23 1207  AMMONIA 44* 57*     CBC: Recent Labs  Lab 12/29/22 0204 12/30/22 0440 12/31/22 0637  01/01/23 0600 01/02/23 0442  WBC 4.9 8.0 13.5* 11.7* 11.3*  NEUTROABS  --   --   --   --  9.6*  HGB 9.9* 9.2* 10.1* 9.5* 9.5*  HCT 30.2* 28.9* 30.9* 29.4* 29.9*  MCV 99.7 100.0 99.4 99.7 101.0*  PLT 175 210 290 305 336      HbA1C: Hgb A1c MFr Bld  Date/Time Value Ref Range Status  11/20/2022 05:04 AM 7.4 (H) 4.8 - 5.6 % Final    Comment:    (NOTE)         Prediabetes: 5.7 - 6.4         Diabetes: >6.4         Glycemic control for adults with diabetes: <7.0   02/15/2022 05:57 AM 5.9 (H) 4.8 - 5.6 % Final    Comment:    (NOTE)         Prediabetes: 5.7 - 6.4         Diabetes: >6.4         Glycemic control for adults with diabetes: <  7.0     Urinalysis: No results for input(s): "COLORURINE", "LABSPEC", "PHURINE", "GLUCOSEU", "HGBUR", "BILIRUBINUR", "KETONESUR", "PROTEINUR", "UROBILINOGEN", "NITRITE", "LEUKOCYTESUR" in the last 72 hours.  Invalid input(s): "APPERANCEUR"    Imaging: CT HEAD WO CONTRAST (5MM)  Result Date: 01/01/2023 CLINICAL DATA:  Mental status change. EXAM: CT HEAD WITHOUT CONTRAST TECHNIQUE: Contiguous axial images were obtained from the base of the skull through the vertex without intravenous contrast. RADIATION DOSE REDUCTION: This exam was performed according to the departmental dose-optimization program which includes automated exposure control, adjustment of the mA and/or kV according to patient size and/or use of iterative reconstruction technique. COMPARISON:  MRI head 12/28/2022.  CT head 11/29/2022. FINDINGS: Brain: No evidence of acute infarction, hemorrhage, hydrocephalus, extra-axial collection or mass lesion/mass effect. Again seen is mild diffuse atrophy and mild periventricular white matter hypodensity, likely chronic small vessel ischemic change. Stable calcification in the posterior left thalamus. Vascular: Atherosclerotic calcifications are present within the cavernous internal carotid arteries. Skull: Normal. Negative for fracture or focal  lesion. Sinuses/Orbits: There is an air-fluid level in the left sphenoid sinus and left maxillary sinus. There is mucosal thickening of ethmoid air cells, frontal sinuses, sphenoid sinuses and maxillary sinuses. There is a partial right mastoid effusion. Orbits are within normal limits. Other: None. IMPRESSION: 1. No acute intracranial process. 2. Stable atrophy and chronic small vessel ischemic change. 3. Paranasal sinus disease with air-fluid levels in the left sphenoid and maxillary sinuses. Correlate for acute sinusitis. 4. Partial right mastoid effusion. Electronically Signed   By: Ronney Asters M.D.   On: 01/01/2023 15:53   DG Abd 1 View  Result Date: 01/01/2023 CLINICAL DATA:  Reposition of orogastric tube. EXAM: ABDOMEN - 1 VIEW COMPARISON:  Abdominal radiograph dated 12/31/2022. FINDINGS: An enteric tube terminates in the stomach with the side port near the gastric cardia. IMPRESSION: An enteric tube terminates in the stomach with the side port near the gastric cardia. Electronically Signed   By: Zerita Boers M.D.   On: 01/01/2023 13:08   DG Chest Port 1 View  Result Date: 01/01/2023 CLINICAL DATA:  The patient's orogastric tube was repositioned, concern for fluid in the lungs during repositioning. EXAM: PORTABLE CHEST 1 VIEW COMPARISON:  Chest radiograph dated 12/30/2022. FINDINGS: The heart size and mediastinal contours are within normal limits. Moderate bilateral perihilar interstitial and airspace opacities are noted. Left basilar atelectasis/airspace disease appears similar to prior exam. There is no pleural effusion or pneumothorax. An endotracheal tube terminates in the midthoracic trachea. An enteric tube enters the stomach and terminates below the field of view. Degenerative changes are seen in the spine. IMPRESSION: 1. Moderate bilateral perihilar interstitial and airspace opacities may reflect pulmonary edema. Aspiration can not be excluded. 2. Left basilar atelectasis/airspace disease,  similar to prior exam. Electronically Signed   By: Zerita Boers M.D.   On: 01/01/2023 13:07   ECHOCARDIOGRAM COMPLETE  Result Date: 12/31/2022    ECHOCARDIOGRAM REPORT   Patient Name:   Brett Blake Date of Exam: 12/31/2022 Medical Rec #:  [id number]         Height:       64.0 in Accession #:    [id number]        Weight:       189.4 lb Date of Birth:  June 11, 1941        BSA:          1.912 m Patient Age:    82 years  BP:           156/77 mmHg Patient Gender: M                 HR:           102 bpm. Exam Location:  [facility] Procedure: 2D Echo Indications:     Elevated Troponin  History:         Patient has no prior history of Echocardiogram examinations.  Sonographer:     Kathlen Brunswick RDCS Referring Phys:  [id number] Teressa Lower Diagnosing Phys: Neoma Laming  Sonographer Comments: Echo performed with patient supine and on artificial respirator and suboptimal subcostal window. Image acquisition challenging due to respiratory motion. IMPRESSIONS  1. Left ventricular ejection fraction, by estimation, is 60 to 65%. The left ventricle has normal function. The left ventricle has no regional wall motion abnormalities. There is mild left ventricular hypertrophy. Left ventricular diastolic parameters are consistent with Grade I diastolic dysfunction (impaired relaxation).  2. Right ventricular systolic function is normal. The right ventricular size is normal.  3. Left atrial size was mildly dilated.  4. Right atrial size was mildly dilated.  5. The mitral valve is normal in structure. Trivial mitral valve regurgitation. No evidence of mitral stenosis.  6. The aortic valve is normal in structure. Aortic valve regurgitation is not visualized. No aortic stenosis is present.  7. The inferior vena cava is normal in size with greater than 50% respiratory variability, suggesting right atrial pressure of 3 mmHg. FINDINGS  Left Ventricle: Left ventricular ejection fraction, by estimation, is 60 to 65%. The left ventricle  has normal function. The left ventricle has no regional wall motion abnormalities. The left ventricular internal cavity size was normal in size. There is  mild left ventricular hypertrophy. Left ventricular diastolic parameters are consistent with Grade I diastolic dysfunction (impaired relaxation). Right Ventricle: The right ventricular size is normal. No increase in right ventricular wall thickness. Right ventricular systolic function is normal. Left Atrium: Left atrial size was mildly dilated. Right Atrium: Right atrial size was mildly dilated. Pericardium: There is no evidence of pericardial effusion. Mitral Valve: The mitral valve is normal in structure. Trivial mitral valve regurgitation. No evidence of mitral valve stenosis. Tricuspid Valve: The tricuspid valve is normal in structure. Tricuspid valve regurgitation is trivial. No evidence of tricuspid stenosis. Aortic Valve: The aortic valve is normal in structure. Aortic valve regurgitation is not visualized. No aortic stenosis is present. Aortic valve peak gradient measures 9.9 mmHg. Pulmonic Valve: The pulmonic valve was normal in structure. Pulmonic valve regurgitation is not visualized. No evidence of pulmonic stenosis. Aorta: The aortic root is normal in size and structure. Venous: The inferior vena cava is normal in size with greater than 50% respiratory variability, suggesting right atrial pressure of 3 mmHg. IAS/Shunts: No atrial level shunt detected by color flow Doppler.  LEFT VENTRICLE PLAX 2D LVIDd:         3.70 cm     Diastology LVIDs:         2.50 cm     LV e' medial:    8.16 cm/s LV PW:         1.30 cm     LV E/e' medial:  8.7 LV IVS:        1.20 cm     LV e' lateral:   8.92 cm/s LVOT diam:     1.90 cm     LV E/e' lateral: 8.0 LV SV:  54 LV SV Index:   28 LVOT Area:     2.84 cm  LV Volumes (MOD) LV vol d, MOD A4C: 44.8 ml LV vol s, MOD A4C: 17.8 ml LV SV MOD A4C:     44.8 ml RIGHT VENTRICLE RV Basal diam:  2.30 cm RV S prime:     15.90  cm/s TAPSE (M-mode): 1.5 cm LEFT ATRIUM             Index        RIGHT ATRIUM           Index LA diam:        3.50 cm 1.83 cm/m   RA Area:     10.10 cm LA Vol (A2C):   31.2 ml 16.32 ml/m  RA Volume:   18.20 ml  9.52 ml/m LA Vol (A4C):   28.4 ml 14.86 ml/m LA Biplane Vol: 31.1 ml 16.27 ml/m  AORTIC VALVE                 PULMONIC VALVE AV Area (Vmax): 2.00 cm     PV Vmax:       1.17 m/s AV Vmax:        157.00 cm/s  PV Peak grad:  5.5 mmHg AV Peak Grad:   9.9 mmHg LVOT Vmax:      110.50 cm/s LVOT Vmean:     73.300 cm/s LVOT VTI:       0.192 m  AORTA Ao Root diam: 3.20 cm Ao Asc diam:  3.00 cm MITRAL VALVE               TRICUSPID VALVE MV Area (PHT): 5.09 cm    TR Peak grad:   51.3 mmHg MV Decel Time: 149 msec    TR Vmax:        358.00 cm/s MV E velocity: 71.30 cm/s MV A velocity: 83.30 cm/s  SHUNTS MV E/A ratio:  0.86        Systemic VTI:  0.19 m                            Systemic Diam: 1.90 cm [employer] Electronically signed by Neoma Laming Signature Date/Time: 12/31/2022/12:54:05 PM    Final      Medications:    sodium chloride     feeding supplement (VITAL AF 1.2 CAL)     piperacillin-tazobactam (ZOSYN)  IV 3.375 g (01/02/23 0523)    Chlorhexidine Gluconate Cloth  6 each Topical Daily   docusate  100 mg Per Tube BID   folic acid  1 mg Per Tube Daily   free water  30 mL Per Tube Q4H   heparin injection (subcutaneous)  5,000 Units Subcutaneous Q8H   insulin aspart  0-20 Units Subcutaneous Q4H   insulin aspart  5 Units Subcutaneous Q4H   lactulose  20 g Oral TID   multivitamin with minerals  1 tablet Per Tube Daily   mouth rinse  15 mL Mouth Rinse Q2H   pantoprazole (PROTONIX) IV  40 mg Intravenous QHS   polyethylene glycol  17 g Per Tube Daily   thiamine (VITAMIN B1) injection  100 mg Intravenous Daily   venlafaxine  18.75 mg Per Tube BID WC   vitamin B-12  100 mcg Per Tube Daily    Assessment/ Plan:     [name] is a 82 y.o.  male with a PMHx of advanced COPD,  asthma, diabetes mellitus  type 2, GERD, hyperlipidemia, hypertension, obstructive sleep apnea, anxiety, who was admitted to [facility] on 12/24/2022 for evaluation of severe tremors.  Patient has history of alcohol intake nightly.  He developed significant delirium tremens upon admission and subsequently intubated for airway protection.    #1: Acute kidney injury: Patient with acute kidney injury most likely secondary to acute tubular necrosis due to hemodynamic compromise.  Renal sonogram was negative.   Renal function stable. No acute need for dialysis.   #2: Acute respiratory failure: Remains on vent off sedation.   Protocols as per critical care.   #3: Diabetes: Continue insulin as per protocol.   #4: Sepsis: Remains on Zosyn  Due to renal recovery, we will sign off at this time.    LOS: [facility] 1/8/202411:33 AM

## 2023-01-02 NOTE — Procedures (Signed)
Central Venous Catheter Insertion Procedure Note  Brett Blake  [id number]  January 17, 1941  Date:01/02/23  Time:6:58 PM   Provider Performing:Aleira Deiter D [name]   Procedure: Insertion of Non-tunneled Central Venous Catheter(36556)with US guidance (41937)    Indication(s) Medication administration, Difficult access, and Hemodialysis  Consent Risks of the procedure as well as the alternatives and risks of each were explained to the patient and/or caregiver.  Consent for the procedure was obtained and is signed in the bedside chart  Anesthesia Topical only with 1% lidocaine   Timeout Verified patient identification, verified procedure, site/side was marked, verified correct patient position, special equipment/implants available, medications/allergies/relevant history reviewed, required imaging and test results available.  Sterile Technique Maximal sterile technique including full sterile barrier drape, hand hygiene, sterile gown, sterile gloves, mask, hair covering, sterile ultrasound probe cover (if used).  Procedure Description Area of catheter insertion was cleaned with chlorhexidine and draped in sterile fashion.   With real-time ultrasound guidance a HD catheter was placed into the left internal jugular vein.  Nonpulsatile blood flow and easy flushing noted in all ports.  The catheter was sutured in place and sterile dressing applied.  Complications/Tolerance None; patient tolerated the procedure well. Chest X-ray is ordered to verify placement for internal jugular or subclavian cannulation.  Chest x-ray is not ordered for femoral cannulation.  EBL Minimal  Specimen(s) None   Line inserted to the 20 cm mark. BIOPATCH applied to the insertion site.   Brett Blake, AGACNP-BC [facility] Prefer epic messenger for cross cover needs If after hours, please call E-link

## 2023-01-02 NOTE — Consult Note (Signed)
Neurology Consultation  Reason for Consult: Persistent altered mental status Referring Physician: Dr. Mortimer Fries  CC: Persistent altered mental status  History is obtained from: Chart  HPI: Brett Blake is a 82 y.o. male with a past medical history of alcohol abuse, COPD, hypertension, diabetes, coronary artery disease, history of nonepileptic seizures, memory changes, presented to the emergency room with complaints of shaking all over with concern for seizures.  Patient himself reported that he believes he has had a seizure.  He reported having 1 drink of alcohol every day but wife thinks that he has been abusing large amounts of liquor and abruptly stopped 2 days prior to presentation on 04/22/2022 due to GI issues. He was initially getting admitted to the hospitalist but became extremely agitated and required ICU admission and eventually intubation for airway protection due to likely delirium tremens and advanced COPD.  Also had creatinine up to 3.09, when his baseline is normal.  Also noted to have mild hyperammonemia-ammonia 57 yesterday. He has been off of sedation for multiple days and has poor neurological exam, for which neurological consultation was obtained.  No family was at bedside at this time.  ROS Unable to obtain due to altered mental status.   Past Medical History:  Diagnosis Date   Anxiety    Arthritis    Asthma    Colon polyps    COPD (chronic obstructive pulmonary disease) ([place])    Depression    Diabetes ([place])    Difficult intubation    GERD (gastroesophageal reflux disease)    High cholesterol    Hyperlipidemia    Hypertension    Hyponatremia 02/23/2022   Sleep apnea      Family History  Problem Relation Age of Onset   Heart attack Mother    Prostate cancer Father    Leukemia Brother    Prostate cancer Brother      Social History:   reports that he has quit smoking. His smoking use included cigarettes. He has a 15.00 pack-year smoking history. He has  never used smokeless tobacco. He reports current alcohol use of about 1.0 - 2.0 standard drink of alcohol per week. He reports that he does not use drugs.  Medications  Current Facility-Administered Medications:    0.9 %  sodium chloride infusion, 250 mL, Intravenous, Continuous, Meda Coffee, Dreama Saa, NP   albuterol (PROVENTIL) (2.5 MG/3ML) 0.083% nebulizer solution 2.5 mg, 2.5 mg, Nebulization, Q4H PRN, Alison Murray, RPH   Chlorhexidine Gluconate Cloth 2 % PADS 6 each, 6 each, Topical, Daily, [place], Bing Neighbors, NP, 6 each at 01/02/23 0959   docusate (COLACE) 50 MG/5ML liquid 100 mg, 100 mg, Per Tube, BID, Teressa Lower, NP, 100 mg at 01/02/23 0959   feeding supplement (PROSource TF20) liquid 60 mL, 60 mL, Per Tube, Daily, Nam, Earley Abide, MD, 60 mL at 01/02/23 1000   feeding supplement (VITAL AF 1.2 CAL) liquid 1,000 mL, 1,000 mL, Per Tube, Continuous, Tyler Pita, MD, Last Rate: 40 mL/hr at 12/31/22 0600, Infusion Verify at 12/31/22 0600   fentaNYL (SUBLIMAZE) injection 25 mcg, 25 mcg, Intravenous, Q1H PRN, Tonye Royalty, NP, 25 mcg at 16/01/09 3235   folic acid (FOLVITE) tablet 1 mg, 1 mg, Per Tube, Daily, Wynelle Cleveland, RPH, 1 mg at 01/02/23 0959   free water 30 mL, 30 mL, Per Tube, Q4H, Aleskerov, Fuad, MD, 30 mL at 01/02/23 0754   heparin injection 5,000 Units, 5,000 Units, Subcutaneous, Q8H, Ottie Glazier, MD, 5,000 Units at 01/02/23  6761   hydrALAZINE (APRESOLINE) injection 10-20 mg, 10-20 mg, Intravenous, Q6H PRN, Teressa Lower, NP, 20 mg at 01/01/23 0527   insulin aspart (novoLOG) injection 0-20 Units, 0-20 Units, Subcutaneous, Q4H, Rust-Chester, Britton L, NP, 4 Units at 01/02/23 0754   insulin aspart (novoLOG) injection 5 Units, 5 Units, Subcutaneous, Q4H, Teressa Lower, NP, 5 Units at 01/02/23 0754   ipratropium-albuterol (DUONEB) 0.5-2.5 (3) MG/3ML nebulizer solution 3 mL, 3 mL, Nebulization, Q6H, Dgayli, Khabib, MD, 3 mL at 01/02/23 0756   lactulose (CHRONULAC)  10 GM/15ML solution 20 g, 20 g, Oral, TID, Teressa Lower, NP, 20 g at 01/02/23 0959   multivitamin with minerals tablet 1 tablet, 1 tablet, Per Tube, Daily, Wynelle Cleveland, RPH, 1 tablet at 01/02/23 9509   Oral care mouth rinse, 15 mL, Mouth Rinse, Q2H, Aleskerov, Fuad, MD, 15 mL at 01/02/23 1000   Oral care mouth rinse, 15 mL, Mouth Rinse, PRN, Ottie Glazier, MD   pantoprazole (PROTONIX) injection 40 mg, 40 mg, Intravenous, QHS, Dgayli, Khabib, MD, 40 mg at 01/01/23 2100   piperacillin-tazobactam (ZOSYN) IVPB 3.375 g, 3.375 g, Intravenous, Q8H, Flora Lipps, MD, Last Rate: 12.5 mL/hr at 01/02/23 0523, 3.375 g at 01/02/23 0523   polyethylene glycol (MIRALAX / GLYCOLAX) packet 17 g, 17 g, Per Tube, Daily, Teressa Lower, NP, 17 g at 01/02/23 0959   [COMPLETED] thiamine (VITAMIN B1) 500 mg in normal saline (50 mL) IVPB, 500 mg, Intravenous, Daily, Stopped at 12/25/22 1156 **FOLLOWED BY** thiamine (VITAMIN B1) injection 100 mg, 100 mg, Intravenous, Daily, Rust-Chester, Toribio Harbour L, NP, 100 mg at 01/02/23 0959   venlafaxine (EFFEXOR) tablet 18.75 mg, 18.75 mg, Per Tube, BID WC, Coulter, Carolyn, RPH, 18.75 mg at 01/02/23 0755   vitamin B-12 (CYANOCOBALAMIN) tablet 100 mcg, 100 mcg, Per Tube, Daily, Dgayli, Berdine Addison, MD, 100 mcg at 01/02/23 1000   Exam: Current vital signs: BP (!) 182/67 (BP Location: Right Arm)   Pulse 78   Temp 99.1 F (37.3 C) (Oral)   Resp (!) 34   Ht '5\' 4"'$  (1.626 m)   Wt 83.4 kg   SpO2 95%   BMI 31.56 kg/m  Vital signs in last 24 hours: Temp:  [97.7 F (36.5 C)-99.1 F (37.3 C)] 99.1 F (37.3 C) (01/08 0800) Pulse Rate:  [72-89] 78 (01/08 0800) Resp:  [24-34] 34 (01/08 0800) BP: (142-182)/(62-81) 182/67 (01/08 0800) SpO2:  [93 %-98 %] 95 % (01/08 0800) FiO2 (%):  [40 %] 40 % (01/08 0328) Weight:  [83.4 kg] 83.4 kg (01/08 0500) General: Intubated, no sedation HEENT: Normocephalic, atraumatic CVs: Regular rhythm Abdomen nondistended nontender Respiratory:  Vented Extremities warm well-perfused Neurological exam Intubated, no sedation No spontaneous movement To very noxious stimulation external rub, question if there is mild eye opening. Does not follow commands Nonverbal Cranial nerves: Pupils are equal sluggishly reactive to light, extraocular movements difficult to assess but has oculocephalic reflexes as well as gaze appears to be midline, facial symmetry difficult to ascertain due to the endotracheal tube. Motor examination with no spontaneous movement.  No movement to Dr. Sherryll Burger Sensory examination: As above Labs I have reviewed labs in epic and the results pertinent to this consultation are:  CBC    Component Value Date/Time   WBC 11.3 (H) 01/02/2023 0442   RBC 2.96 (L) 01/02/2023 0442   HGB 9.5 (L) 01/02/2023 0442   HCT 29.9 (L) 01/02/2023 0442   PLT 336 01/02/2023 0442   MCV 101.0 (H) 01/02/2023 0442   [place]  32.1 01/02/2023 0442   MCHC 31.8 01/02/2023 0442   RDW 19.9 (H) 01/02/2023 0442   LYMPHSABS 0.5 (L) 01/02/2023 0442   MONOABS 0.7 01/02/2023 0442   EOSABS 0.1 01/02/2023 0442   BASOSABS 0.1 01/02/2023 0442   CMP     Component Value Date/Time   NA 143 01/02/2023 0442   K 4.1 01/02/2023 0442   CL 102 01/02/2023 0442   CO2 28 01/02/2023 0442   GLUCOSE 228 (H) 01/02/2023 0442   BUN 98 (H) 01/02/2023 0442   CREATININE 1.93 (H) 01/02/2023 0442   CALCIUM 8.8 (L) 01/02/2023 0442   PROT 6.7 01/01/2023 0600   ALBUMIN 1.8 (L) 01/01/2023 0600   AST 52 (H) 01/01/2023 0600   ALT 33 01/01/2023 0600   ALKPHOS 106 01/01/2023 0600   BILITOT 1.0 01/01/2023 0600   GFRNONAA 35 (L) 01/02/2023 0442   GFRAA 52 (L) 07/16/2020 1408  TSH 3.7 B12 1 year ago was 279.  Imaging I have reviewed the images obtained: MRI brain done on December 28, 2022 with no acute or reversible finding for altered mental status or seizure.  Chronic calcification in posterior lateral left thalamus, age-related generalized  atrophy.  Neurodiagnostics EEG 12/30/2022 with severe diffuse slowing while on propofol.  No seizures seen. EEG on 12/23/2022 with generalized burst attenuation suggestive of severe to profound diffuse encephalopathy but likely related to sedation.  No seizures seen.  Assessment: 82 year old man past history of alcohol abuse COPD hypertension diabetes coronary artery disease history of nonepileptic spells memory changes presenting for complaints of seizures all over, initially reporting drinking 1 drink a night but turns out had been abusing alcohol for the past few days with sudden discontinuation 2 days prior to presentation.  Admitted to the ICU for delirium tremens.  Also noted to have AKI which is being corrected.  Mild hyperammonemia also noted which is being treated by the primary team. Has been off of sedation for the past few days and is still very unresponsive for which neurological consultation was obtained. MRI with no evidence of stroke or reversible causes of altered mental status or seizures. EEG with profound background suppression due to sedation-no seizures. Clinical presentation likely consistent with toxic metabolic encephalopathy due to multiple metabolic derangements and likely alcohol withdrawal/delirium tremens making this more protracted than to be expected. Suspicion for underlying CNS infection or inflammatory process  Recommendations: Agree with repeating EEG. Management of toxic metabolic transfer primary team as you are. I am not sure if his clinical picture fits that of a CNS infection or CNS autoimmune inflammatory process but the primary team has requested IR for a fluoroscopy guided LP, which at least would be of use to rule out acute infection or acute inflammatory process, which might be reversible. I suspect that his presentation is more consistent with alcohol related delirium tremens and metabolic encephalopathy rather than an infectious or CNS inflammatory cause  and because of his prior alcohol abuse, he may take time to come around even after correction of his metabolic derangements and his lab tests that have now started to trend in a positive direction. Historically has had low B12-continue B12 and thiamine replenishment.  I will continue to follow with you. Plan was discussed with the CCM team.  -- Amie Portland, MD Neurologist [facility] Pager: [phone]  [place] Performed by: Amie Portland, MD Total critical care time: 34 minutes Critical care time was exclusive of separately billable procedures and treating other patients and/or supervising APPs/Residents/Students Critical care  was necessary to treat or prevent imminent or life-threatening deterioration. This patient is critically ill and at significant risk for neurological worsening and/or death and care requires constant monitoring. Critical care was time spent personally by me on the following activities: development of treatment plan with patient and/or surrogate as well as nursing, discussions with consultants, evaluation of patient's response to treatment, examination of patient, obtaining history from patient or surrogate, ordering and performing treatments and interventions, ordering and review of laboratory studies, ordering and review of radiographic studies, pulse oximetry, re-evaluation of patient's condition, participation in multidisciplinary rounds and medical decision making of high complexity in the care of this patient.

## 2023-01-02 NOTE — Progress Notes (Signed)
[place] for Electrolyte Monitoring and Replacement   Recent Labs: Potassium (mmol/L)  Date Value  01/02/2023 4.1   Magnesium (mg/dL)  Date Value  01/02/2023 2.4   Calcium (mg/dL)  Date Value  01/02/2023 8.8 (L)   Albumin (g/dL)  Date Value  01/01/2023 1.8 (L)   Phosphorus (mg/dL)  Date Value  01/02/2023 3.4   Sodium (mmol/L)  Date Value  01/02/2023 143  Corrected Ca: 9.9 mg/dL  Assessment  Brett Blake is a 82 y.o. male presenting with seizure. PMH significant for AUD, HTN, HLD, DM, COPD, dementia, depression with anxiety, pseudoseizure, former smoker. Pharmacy has been consulted to monitor and replace electrolytes.  Diet: Vital 1.2 @ 70 mL/hr + FWF 30 mL per tube Q4H  Goal of Therapy: Electrolytes WNL  Plan:  Potassium: 4.1 >> 4.1, no replacement needed Phosphorus: 3.8 >> 3.4, no replacement needed Magnesium: 2.4 >> 2.4, no replacement needed Check renal function panel with AM labs  Thank you for allowing pharmacy to be a part of this patient's care.  Gretel Acre, PharmD PGY1 Pharmacy Resident 01/02/2023 1:47 PM

## 2023-01-02 NOTE — Progress Notes (Signed)
Nutrition Follow-up  DOCUMENTATION CODES:   Obesity unspecified  INTERVENTION:   Increase Vital 1.2 to 79m/hr continuous   Free water flushes 383mq4 hours to maintain tube patency   Regimen provides 2016kcal/day, 126g/day protein and 154277may of free water.   Daily weights  NUTRITION DIAGNOSIS:   Inadequate oral intake related to inability to eat as evidenced by NPO status.  GOAL:   Provide needs based on ASPEN/SCCM guidelines -met   MONITOR:   Vent status, Labs, Weight trends, Skin, TF tolerance, I & O's  ASSESSMENT:   [age]o male with h/o seizures, HTN, COPD, HLD, DM, etoh abuse, PAF, depression, anxiety, dementia, OSA and GERD who is admitted with ETOH withdrawal and possible seizures.  Pt remains sedated and ventilated. OGT in place. Pt is tolerating tube feeds well at goal rate. Pt is off sedation with poor neuro exam; neurology consulted. Per chart, pt is up ~12lbs since admission. Pt +3.9L on his I & Os.   Medications reviewed and include: colace, folic acid, heparin, insulin, lactulose, MVI, miralax, protonix, thiamine, B12, zosyn   Labs reviewed: K 4.1 wnl, BUN 98(H), creat 1.93(H), P 3.4 wnl, Mg 2.4 wnl Wbc- 11.3(H), Hgb 9.5(L), Hct 29.9(L) Cbgs- 198, 197, 205 x 24 hrs  Patient is currently intubated on ventilator support MV: 11.4 L/min Temp (24hrs), Avg:98.3 F (36.8 C), Min:97.7 F (36.5 C), Max:99.1 F (37.3 C)  Propofol: none   MAP- >40m3m  UOP- 950ml109miet Order:   Diet Order     None      EDUCATION NEEDS:   Not appropriate for education at this time  Skin:  Skin Assessment: Reviewed RN Assessment (ecchymosis)  Last BM:  1/7- type 7  Height:   Ht Readings from Last 1 Encounters:  12/19/2022 '5\' 4"'$  (1.626 m)    Weight:   Wt Readings from Last 1 Encounters:  01/02/23 83.4 kg    Ideal Body Weight:  59.1 kg  BMI:  Body mass index is 31.56 kg/m.  Estimated Nutritional Needs:   Kcal:  1800-2100kcal/day  Protein:  >  118 grams  Fluid:  1.5-1.8L/day  CaseyKoleen DistanceRD, LDN Please refer to [facility]antyneRD and/or RD on-call/weekend/after hours pager

## 2023-01-02 NOTE — IPAL (Signed)
  Interdisciplinary Goals of Care Family Meeting   Date carried out: 01/02/2023  Location of the meeting: Bedside  Member's involved: Physician and Family Member or next of kin     GOALS OF CARE DISCUSSION  The Clinical status was relayed to family in detail- Wife and Daughter at Bedside  Updated and notified of patients medical condition- Patient remains unresponsive and will not open eyes to command.   Patient is having a weak cough and struggling to remove secretions.   Patient with increased WOB and using accessory muscles to breathe Explained to family course of therapy and the modalities   Patient with Progressive multiorgan failure with a very high probablity of a very minimal chance of meaningful recovery despite all aggressive and optimal medical therapy.    Family understands the situation.  SEVERE ETOH ABUSE PROGRESSIVE DEMENTIA FALLING A LOT AT HOME NIGHTMARES Progressive Kidney failure and increase toxins  They have consented and agreed to DNR Status We will plan for one session of HD and assess interval neurological changes   Family are satisfied with Plan of action and management. All questions answered  Additional CC time 35 mins   Brett Blake Patricia Pesa, M.D.  [facility]  Medical Director [place] Director [facility] Cardio-Pulmonary Department

## 2023-01-02 NOTE — Progress Notes (Signed)
This RN spoke with [facility]) regarding this patient. Per [employer], patient is not a candidate for donation due to his age. Nursing staff to update [facility] with any plans for brain death testing, or with cardiac time of death. Patient's nurse, Mitzi Davenport, RN, updated.

## 2023-01-02 NOTE — Progress Notes (Signed)
Eeg done 

## 2023-01-02 NOTE — Inpatient Diabetes Management (Signed)
Inpatient Diabetes Program Recommendations  AACE/ADA: New Consensus Statement on Inpatient Glycemic Control   Target Ranges:  Prepandial:   less than 140 mg/dL      Peak postprandial:   less than 180 mg/dL (1-2 hours)      Critically ill patients:  140 - 180 mg/dL    Latest Reference Range & Units 01/01/23 07:50 01/01/23 12:01 01/01/23 16:01 01/01/23 19:59 01/01/23 20:25 01/01/23 23:49 01/02/23 03:53 01/02/23 07:50  Glucose-Capillary 70 - 99 mg/dL 219 (H) 194 (H) 249 (H) 238 (H) 259 (H) 185 (H) 205 (H) 197 (H)   Review of Glycemic Control  Diabetes history: DM2 Outpatient Diabetes medications: None Current orders for Inpatient glycemic control: Novolog 0-20 units Q4H, Novolog 5 units Q4H; Vital @ 60 ml/hr   Inpatient Diabetes Program Recommendations:     Insulin: Please consider increasing tube feeding coverage to Novolog 8 units Q4H. If tube feeding is stopped or held then Novolog tube feeding coverage should also be stopped or held.   Thanks, Barnie Alderman, RN, MSN, [place] Diabetes Coordinator Inpatient Diabetes Program [phone] (Team Pager from 8am to [place])

## 2023-01-03 ENCOUNTER — Inpatient Hospital Stay: Payer: Medicare Other

## 2023-01-03 ENCOUNTER — Inpatient Hospital Stay: Payer: Medicare Other | Admitting: Radiology

## 2023-01-03 ENCOUNTER — Other Ambulatory Visit: Payer: Medicare Other

## 2023-01-03 DIAGNOSIS — J939 Pneumothorax, unspecified: Secondary | ICD-10-CM | POA: Diagnosis not present

## 2023-01-03 DIAGNOSIS — R569 Unspecified convulsions: Secondary | ICD-10-CM | POA: Diagnosis not present

## 2023-01-03 HISTORY — PX: IR FL GUIDED LOC OF NEEDLE/CATH TIP FOR SPINAL INJECTION RT: IMG2397

## 2023-01-03 LAB — MENINGITIS/ENCEPHALITIS PANEL (CSF)

## 2023-01-03 LAB — RENAL FUNCTION PANEL
Albumin: 1.9 g/dL — ABNORMAL LOW (ref 3.5–5.0)
Albumin: 1.7 g/dL — ABNORMAL LOW (ref 3.5–5.0)
Anion gap: 13 (ref 5–15)
Anion gap: 12 (ref 5–15)
BUN: 96 mg/dL — ABNORMAL HIGH (ref 8–23)
BUN: 98 mg/dL — ABNORMAL HIGH (ref 8–23)
CO2: 26 mmol/L (ref 22–32)
CO2: 29 mmol/L (ref 22–32)
Calcium: 8.9 mg/dL (ref 8.9–10.3)
Calcium: 9 mg/dL (ref 8.9–10.3)
Chloride: 106 mmol/L (ref 98–111)
Chloride: 106 mmol/L (ref 98–111)
Creatinine, Ser: 1.92 mg/dL — ABNORMAL HIGH (ref 0.61–1.24)
Creatinine, Ser: 2.02 mg/dL — ABNORMAL HIGH (ref 0.61–1.24)
GFR, Estimated: 35 mL/min — ABNORMAL LOW (ref >60)
GFR, Estimated: 33 mL/min — ABNORMAL LOW (ref >60)
Glucose, Bld: 210 mg/dL — ABNORMAL HIGH (ref 70–99)
Glucose, Bld: 132 mg/dL — ABNORMAL HIGH (ref 70–99)
Phosphorus: 3.2 mg/dL (ref 2.5–4.6)
Phosphorus: 4.5 mg/dL (ref 2.5–4.6)
Potassium: 4.2 mmol/L (ref 3.5–5.1)
Potassium: 4 mmol/L (ref 3.5–5.1)
Sodium: 145 mmol/L (ref 135–145)
Sodium: 147 mmol/L — ABNORMAL HIGH (ref 135–145)

## 2023-01-03 LAB — MAGNESIUM: Magnesium: 2.4 mg/dL (ref 1.7–2.4)

## 2023-01-03 LAB — HEPATITIS B SURFACE ANTIGEN: Hepatitis B Surface Ag: NONREACTIVE

## 2023-01-03 LAB — CSF CELL COUNT WITH DIFFERENTIAL
Eosinophils, CSF: 0 %
Eosinophils, CSF: 0 %
Lymphs, CSF: 69 %
Lymphs, CSF: 71 %
Monocyte-Macrophage-Spinal Fluid: 24 %
Monocyte-Macrophage-Spinal Fluid: 26 %
RBC Count, CSF: 10 /mm3 — ABNORMAL HIGH (ref 0–3)
RBC Count, CSF: 7 /mm3 — ABNORMAL HIGH (ref 0–3)
Segmented Neutrophils-CSF: 5 %
Segmented Neutrophils-CSF: 5 %
Tube #: 1
Tube #: 4
WBC, CSF: 2 /mm3 (ref 0–5)
WBC, CSF: 5 /mm3 (ref 0–5)

## 2023-01-03 LAB — CBC
HCT: 28.9 % — ABNORMAL LOW (ref 39.0–52.0)
Hemoglobin: 9.3 g/dL — ABNORMAL LOW (ref 13.0–17.0)
MCH: 32.4 pg (ref 26.0–34.0)
MCHC: 32.2 g/dL (ref 30.0–36.0)
MCV: 100.7 fL — ABNORMAL HIGH (ref 80.0–100.0)
Platelets: 389 10*3/uL (ref 150–400)
RBC: 2.87 MIL/uL — ABNORMAL LOW (ref 4.22–5.81)
RDW: 20.1 % — ABNORMAL HIGH (ref 11.5–15.5)
WBC: 13.4 10*3/uL — ABNORMAL HIGH (ref 4.0–10.5)
nRBC: 3.7 % — ABNORMAL HIGH (ref 0.0–0.2)

## 2023-01-03 LAB — TROPONIN I (HIGH SENSITIVITY)
Troponin I (High Sensitivity): 76 ng/L — ABNORMAL HIGH (ref <18)
Troponin I (High Sensitivity): 95 ng/L — ABNORMAL HIGH (ref <18)

## 2023-01-03 LAB — GLUCOSE, CAPILLARY
Glucose-Capillary: 206 mg/dL — ABNORMAL HIGH (ref 70–99)
Glucose-Capillary: 188 mg/dL — ABNORMAL HIGH (ref 70–99)
Glucose-Capillary: 206 mg/dL — ABNORMAL HIGH (ref 70–99)
Glucose-Capillary: 181 mg/dL — ABNORMAL HIGH (ref 70–99)
Glucose-Capillary: 145 mg/dL — ABNORMAL HIGH (ref 70–99)
Glucose-Capillary: 167 mg/dL — ABNORMAL HIGH (ref 70–99)
Glucose-Capillary: 150 mg/dL — ABNORMAL HIGH (ref 70–99)

## 2023-01-03 LAB — PROTEIN AND GLUCOSE, CSF
Glucose, CSF: 123 mg/dL — ABNORMAL HIGH (ref 40–70)
Total  Protein, CSF: 47 mg/dL — ABNORMAL HIGH (ref 15–45)

## 2023-01-03 MED ORDER — THIAMINE MONONITRATE 100 MG PO TABS
100.0000 mg | ORAL_TABLET | Freq: Every day | ORAL | Status: DC
  Administered 2023-01-04 – 2023-01-08 (×5): 100 mg
  Filled 2023-01-03 (×5): qty 1

## 2023-01-03 MED ORDER — FENTANYL CITRATE (PF) 100 MCG/2ML IJ SOLN
100.0000 ug | Freq: Once | INTRAMUSCULAR | Status: DC
  Filled 2023-01-03: qty 2

## 2023-01-03 MED ORDER — LIDOCAINE HCL (PF) 1 % IJ SOLN: 5.0000 mL | INTRAMUSCULAR | Status: DC | PRN

## 2023-01-03 MED ORDER — PRISMASOL BGK 4/2.5 32-4-2.5 MEQ/L REPLACEMENT SOLN: Status: DC

## 2023-01-03 MED ORDER — ANTICOAGULANT SODIUM CITRATE 4% (200MG/5ML) IV SOLN: 5.0000 mL | Status: DC | PRN

## 2023-01-03 MED ORDER — PRISMASOL BGK 4/2.5 32-4-2.5 MEQ/L EC SOLN: Status: DC

## 2023-01-03 MED ORDER — ALTEPLASE 2 MG IJ SOLR: 2.0000 mg | Freq: Once | INTRAMUSCULAR | Status: DC | PRN

## 2023-01-03 MED ORDER — JUVEN PO PACK
1.0000 | PACK | Freq: Two times a day (BID) | ORAL | Status: DC
  Administered 2023-01-04 – 2023-01-08 (×10): 1

## 2023-01-03 MED ORDER — HEPARIN SODIUM (PORCINE) 1000 UNIT/ML IJ SOLN
INTRAMUSCULAR | Status: AC
  Filled 2023-01-03: qty 10

## 2023-01-03 MED ORDER — FENTANYL CITRATE (PF) 100 MCG/2ML IJ SOLN
100.0000 ug | Freq: Once | INTRAMUSCULAR | Status: AC
  Administered 2023-01-03: 100 ug via INTRAVENOUS
  Filled 2023-01-03: qty 2

## 2023-01-03 MED ORDER — HEPARIN SODIUM (PORCINE) 1000 UNIT/ML DIALYSIS
1000.0000 [IU] | INTRAMUSCULAR | Status: DC | PRN
  Administered 2023-01-03: 2800 [IU] via INTRAVENOUS_CENTRAL
  Filled 2023-01-03: qty 3

## 2023-01-03 MED ORDER — LIDOCAINE HCL (PF) 1 % IJ SOLN
3.0000 mL | Freq: Once | INTRAMUSCULAR | Status: AC
  Administered 2023-01-03: 3 mL

## 2023-01-03 MED ORDER — MIDAZOLAM HCL 2 MG/2ML IJ SOLN
2.0000 mg | Freq: Once | INTRAMUSCULAR | Status: DC
  Filled 2023-01-03: qty 2

## 2023-01-03 MED ORDER — HEPARIN SODIUM (PORCINE) 1000 UNIT/ML DIALYSIS: 1000.0000 [IU] | INTRAMUSCULAR | Status: DC | PRN

## 2023-01-03 MED ORDER — LIDOCAINE-PRILOCAINE 2.5-2.5 % EX CREA: 1.0000 | TOPICAL_CREAM | CUTANEOUS | Status: DC | PRN

## 2023-01-03 MED ORDER — ZINC OXIDE 40 % EX OINT
TOPICAL_OINTMENT | Freq: Four times a day (QID) | CUTANEOUS | Status: DC
  Filled 2023-01-03 (×2): qty 113

## 2023-01-03 MED ORDER — VECURONIUM BROMIDE 10 MG IV SOLR
10.0000 mg | Freq: Once | INTRAVENOUS | Status: DC
  Filled 2023-01-03: qty 10

## 2023-01-03 MED ORDER — MIDAZOLAM HCL 2 MG/2ML IJ SOLN
2.0000 mg | Freq: Once | INTRAMUSCULAR | Status: AC
  Administered 2023-01-03: 2 mg via INTRAVENOUS
  Filled 2023-01-03: qty 2

## 2023-01-03 MED ORDER — VECURONIUM BROMIDE 10 MG IV SOLR
10.0000 mg | Freq: Once | INTRAVENOUS | Status: AC
  Administered 2023-01-03: 10 mg via INTRAVENOUS
  Filled 2023-01-03: qty 10

## 2023-01-03 MED ORDER — PENTAFLUOROPROP-TETRAFLUOROETH EX AERO: 1.0000 | INHALATION_SPRAY | CUTANEOUS | Status: DC | PRN

## 2023-01-03 MED ORDER — RENA-VITE PO TABS
1.0000 | ORAL_TABLET | Freq: Every day | ORAL | Status: DC
  Administered 2023-01-03 – 2023-01-08 (×6): 1
  Filled 2023-01-03 (×7): qty 1

## 2023-01-03 NOTE — Progress Notes (Addendum)
[place] for Electrolyte Monitoring and Replacement   Recent Labs: Potassium (mmol/L)  Date Value  01/03/2023 4.2   Magnesium (mg/dL)  Date Value  01/03/2023 2.4   Calcium (mg/dL)  Date Value  01/03/2023 8.9   Albumin (g/dL)  Date Value  01/03/2023 1.9 (L)   Phosphorus (mg/dL)  Date Value  01/03/2023 3.2   Sodium (mmol/L)  Date Value  01/03/2023 145  Corrected Ca: 9.9 mg/dL  Assessment  Brett Blake is a 82 y.o. male presenting with seizure. PMH significant for AUD, HTN, HLD, DM, COPD, dementia, depression with anxiety, pseudoseizure, former smoker. Patient to undergo one session of HD 1/9. Pharmacy has been consulted to monitor and replace electrolytes.  Diet: Vital 1.2 @ 70 mL/hr + FWF 30 mL per tube Q4H  Goal of Therapy: Electrolytes WNL  Plan:  Potassium: 4.1 >> 4.2, no replacement needed Phosphorus: 3.4 >> 3.2, no replacement needed Magnesium: 2.4 >> 2.4, no replacement needed Check renal function panel with AM labs Pharmacy will sign off and continue to monitor peripherally  Thank you for allowing pharmacy to be a part of this patient's care.  Gretel Acre, PharmD PGY1 Pharmacy Resident 01/03/2023 7:02 AM

## 2023-01-03 NOTE — Progress Notes (Signed)
Pharmacy Antibiotic Note  Brett Blake is a 82 y.o. male admitted on 11/29/2022 with pneumonia. PMH significant for AUD, HTN, HLD, DM, COPD, dementia, depression with anxiety, pseudoseizure, former smoker. Pharmacy has been consulted for Zosyn dosing.  Plan: Day 5 of antibiotics Continue Zosyn 3.375 g IV Q8H Continue to monitor renal function and follow culture results   Height: '5\' 4"'$  (162.6 cm) Weight: 83.1 kg (183 lb 3.2 oz) IBW/kg (Calculated) : 59.2  Temp (24hrs), Avg:98.8 F (37.1 C), Min:98.2 F (36.8 C), Max:99.1 F (37.3 C)  Recent Labs  Lab 12/30/22 0440 12/31/22 0637 01/01/23 0600 01/01/23 1756 01/02/23 0442 01/03/23 0507  WBC 8.0 13.5* 11.7*  --  11.3* 13.4*  CREATININE 1.75* 1.96* 1.92* 1.85* 1.93* 1.92*     Estimated Creatinine Clearance: 29.9 mL/min (A) (by C-G formula based on SCr of 1.92 mg/dL (H)).    Allergies  Allergen Reactions   Ace Inhibitors Cough   Metoprolol Diarrhea    Patient has been tolerating Propranolol 40 mg twice daily since at least 05/2022.   Nsaids Rash   Sulfa Antibiotics Rash    Antimicrobials this admission: 1/5 Zosyn >>     Dose adjustments this admission:  Microbiology results: 12/28 MRSA PCR: negative  1/5 Resp Cx: E. Cloacae (R- cefazolin), MSSA (R-clinda, erythro) 1/6 MRSA PCR: negative  Thank you for allowing pharmacy to be a part of this patient's care.  Gretel Acre, PharmD PGY1 Pharmacy Resident 01/03/2023 7:03 AM

## 2023-01-03 NOTE — Procedures (Signed)
Intubation Procedure Note  Brett Blake  [id number]  [birth year]  Date:01/03/23  Time:11:37 AM   Provider Performing:Lynsey Ange D [name]    Procedure: Intubation (60737)  Indication(s) Respiratory Failure  Consent Risks of the procedure as well as the alternatives and risks of each were explained to the patient and/or caregiver.  Consent for the procedure was obtained and is signed in the bedside chart   Anesthesia Versed, Fentanyl, and Vecuronium   Time Out Verified patient identification, verified procedure, site/side was marked, verified correct patient position, special equipment/implants available, medications/allergies/relevant history reviewed, required imaging and test results available.   Sterile Technique Usual hand hygeine, masks, and gloves were used   Procedure Description Patient positioned in bed supine.  Sedation given as noted above.  Patient was intubated with endotracheal tube using Glidescope.  View was Grade 1 full glottis .  Number of attempts was 1.  Colorimetric CO2 detector was consistent with tracheal placement.   Complications/Tolerance None; patient tolerated the procedure well. Chest X-ray is ordered to verify placement.   EBL Minimal   Specimen(s) None  Tube was exchanged over bougie Size 8.5 ETT Tube secured at 23 cm at lip   Darel Hong, AGACNP-BC [place] epic messenger for cross cover needs If after hours, please call E-link

## 2023-01-03 NOTE — Progress Notes (Signed)
[facility]  PROGRESS NOTE   Subjective:   Dialysis treatment on the patient. Still producing urine at this time. However has azotemia.  Objective:  Vital signs: Blood pressure 129/67, pulse 99, temperature 98 F (36.7 C), temperature source Oral, resp. rate 20, height '5\' 4"'$  (1.626 m), weight 83.1 kg, SpO2 97 %.  Intake/Output Summary (Last 24 hours) at 01/03/2023 1711 Last data filed at 01/03/2023 0555 Gross per 24 hour  Intake 1278.95 ml  Output 1700 ml  Net -421.05 ml    Filed Weights   12/31/22 0500 01/02/23 0500 01/03/23 0423  Weight: 85.9 kg 83.4 kg 83.1 kg     Physical Exam: General: Critically ill-appearing  Head:  Normocephalic, atraumatic. Moist oral mucosal membranes  Eyes:  Anicteric  Lungs:   Clear to auscultation, normal effort  Heart:  S1S2 no rubs  Abdomen:   Soft, nontender, bowel sounds present  Extremities:  Trace peripheral edema.  Neurologic:  Poorly responsive  Skin:  No lesions  Access: Left IJ temporary hemodialysis catheter    Basic Metabolic Panel: Recent Labs  Lab 12/31/22 0637 01/01/23 0557 01/01/23 0600 01/01/23 1756 01/02/23 0442 01/03/23 0507  NA 142  --  143 141 143 145  K 4.9  --  4.7 4.1  4.1 4.1 4.2  CL 104  --  105 102 102 106  CO2 26  --  '26 26 28 26  '$ GLUCOSE 226*  --  218* 274* 228* 210*  BUN 79*  --  92* 98* 98* 96*  CREATININE 1.96*  --  1.92* 1.85* 1.93* 1.92*  CALCIUM 8.8*  --  8.6* 8.7* 8.8* 8.9  MG 2.3  --  2.3 2.4 2.4 2.4  PHOS 2.8 3.4  --  3.8 3.4 3.2    GFR: Estimated Creatinine Clearance: 29.9 mL/min (A) (by C-G formula based on SCr of 1.92 mg/dL (H)).  Liver Function Tests: Recent Labs  Lab 12/28/22 0322 01/01/23 0600 01/03/23 0507  AST  --  52*  --   ALT  --  33  --   ALKPHOS  --  106  --   BILITOT  --  1.0  --   PROT  --  6.7  --   ALBUMIN 2.1* 1.8* 1.9*    No results for input(s): "LIPASE", "AMYLASE" in the last 168 hours. Recent Labs  Lab 12/29/22 1113 01/01/23 1207   AMMONIA 44* 57*     CBC: Recent Labs  Lab 12/30/22 0440 12/31/22 0637 01/01/23 0600 01/02/23 0442 01/03/23 0507  WBC 8.0 13.5* 11.7* 11.3* 13.4*  NEUTROABS  --   --   --  9.6*  --   HGB 9.2* 10.1* 9.5* 9.5* 9.3*  HCT 28.9* 30.9* 29.4* 29.9* 28.9*  MCV 100.0 99.4 99.7 101.0* 100.7*  PLT 210 290 305 336 389      HbA1C: Hgb A1c MFr Bld  Date/Time Value Ref Range Status  11/20/2022 05:04 AM 7.4 (H) 4.8 - 5.6 % Final    Comment:    (NOTE)         Prediabetes: 5.7 - 6.4         Diabetes: >6.4         Glycemic control for adults with diabetes: <7.0   02/15/2022 05:57 AM 5.9 (H) 4.8 - 5.6 % Final    Comment:    (NOTE)         Prediabetes: 5.7 - 6.4         Diabetes: >6.4  Glycemic control for adults with diabetes: <7.0     Urinalysis: No results for input(s): "COLORURINE", "LABSPEC", "PHURINE", "GLUCOSEU", "HGBUR", "BILIRUBINUR", "KETONESUR", "PROTEINUR", "UROBILINOGEN", "NITRITE", "LEUKOCYTESUR" in the last 72 hours.  Invalid input(s): "APPERANCEUR"    Imaging: IR FL GUIDED LOC OF NEEDLE/CATH TIP FOR SPINAL INJECT RT  Result Date: 01/03/2023 CLINICAL DATA:  Patient with altered mental status, decreased level of consciousness. Request received for diagnostic lumbar puncture. EXAM: DIAGNOSTIC LUMBAR PUNCTURE UNDER FLUOROSCOPIC GUIDANCE COMPARISON:  None Available. FLUOROSCOPY: Radiation Exposure Index (as provided by the fluoroscopic device): 4.20 mGy Kerma PROCEDURE: This procedure was performed by Reatha Armour, PA-C and supervised and interpreted by Dr Michaelle Birks Informed consent was obtained from the the patient and/or patient's representative prior to the procedure, including potential complications of headache, allergy, and pain. With the patient prone, the lower back was prepped with Betadine. 1% Lidocaine was used for local anesthesia. Lumbar puncture was performed at the L3-L4 level using a 22 gauge needle with return of clear CSF with an opening pressure of  25 cm water. Closing pressure of 15 cm water. 10 ml of CSF were obtained for laboratory studies. The patient tolerated the procedure well and there were no apparent complications. IMPRESSION: 1.  Successful, uncomplicated lumbar puncture at L3-L4 level. 10 mL of clear CSF submitted for analysis. 2. Documented opening pressure of 25 cm H20, and closing pressure of 15 cm H20 Electronically Signed   By: Michaelle Birks M.D.   On: 01/03/2023 16:20   DG Chest Port 1 View  Result Date: 01/03/2023 CLINICAL DATA:  Right pneumothorax, chest tube placement EXAM: PORTABLE CHEST 1 VIEW COMPARISON:  Previous studies including the examination done earlier today FINDINGS: There is interval placement of right chest tube with its tip in right lower lung field. There is resolution of right pneumothorax. Subcutaneous emphysema is seen in right chest wall. Tip of endotracheal tube is 4.5 cm above the carina. Enteric tube is noted traversing the esophagus. Tip of left IJ central venous catheter is seen in superior vena cava. Increased interstitial markings are seen in both lungs suggesting scarring or interstitial pneumonia. No new focal infiltrates are seen. There is no significant pleural effusion. IMPRESSION: Interval clearing of right pneumothorax after placement of right chest tube. No new focal infiltrates are seen. Electronically Signed   By: Elmer Picker M.D.   On: 01/03/2023 13:09   DG Chest Port 1 View  Addendum Date: 01/03/2023   ADDENDUM REPORT: 01/03/2023 12:19 ADDENDUM: Critical Value/emergent results were called by telephone at the time of interpretation on 01/03/2023 at 12:19 pm to provider [facility] , who verbally acknowledged these results. Electronically Signed   By: Marijo Conception M.D.   On: 01/03/2023 12:19   Result Date: 01/03/2023 CLINICAL DATA:  Status post intubation. EXAM: PORTABLE CHEST 1 VIEW COMPARISON:  January 02, 2023. FINDINGS: There is interval development of large right sided  pneumothorax. Endotracheal and nasogastric tubes appear to be in good position. Left internal jugular catheter is unchanged. Minimal left basilar subsegmental atelectasis is noted. Bony thorax is unremarkable. IMPRESSION: Interval development of large right-sided pneumothorax. Electronically Signed: By: Marijo Conception M.D. On: 01/03/2023 12:11   DG Abd 1 View  Result Date: 01/03/2023 CLINICAL DATA:  81 year old male status post intubation and oral enteric tube placement. EXAM: ABDOMEN - 1 VIEW COMPARISON:  Portable abdomen 01/01/2023. FINDINGS: Portable AP semi upright view at 1159 hours. Enteric tube terminates in the stomach near midline. Side hole at the level  of the gastric body. Stable cholecystectomy clips. Mid lumbar vertebral augmentation again noted. Nonobstructed visible bowel gas pattern. At least moderate sized right pneumothorax is visible, see portable chest x-ray reported separately. IMPRESSION: 1. At least moderate right side pneumothorax is visible, see portable chest x-ray from the same time reported separately. 2. Satisfactory enteric tube placement into the stomach. Electronically Signed   By: Genevie Ann M.D.   On: 01/03/2023 12:14   DG Chest Port 1 View  Result Date: 01/02/2023 CLINICAL DATA:  Central line placement EXAM: PORTABLE CHEST 1 VIEW COMPARISON:  Chest x-ray 01/01/2023 FINDINGS: Endotracheal tube tip is 3.7 cm above the carina. Left-sided central venous catheter tip projects over the brachiocephalic SVC junction. Enteric tube extends below the diaphragm into the stomach. Stiff the cardiomediastinal silhouette is stable, the heart is enlarged. There central pulmonary vascular congestion. There is a small left pleural effusion, new from prior. No pneumothorax or acute fracture. IMPRESSION: 1. Left-sided central venous catheter tip projects over the brachiocephalic SVC junction. 2. Cardiomegaly with central pulmonary vascular congestion and small left pleural effusion. Electronically  Signed   By: Ronney Asters M.D.   On: 01/02/2023 19:49   EEG adult  Result Date: 01/02/2023 Lora Havens, MD     01/02/2023  2:27 PM Patient Name: Brett Blake MRN: [id number] Epilepsy Attending: Lora Havens Referring Physician/Provider: Teressa Lower, NP Date: 01/02/2023 Duration: 33.19 mins Patient history:  82 year old man past history of alcohol abuse COPD hypertension diabetes coronary artery disease history of nonepileptic spells memory changes presenting for complaints of seizures.  EEG to evaluate for seizure. Level of alertness: lethargic AEDs during EEG study: Technical aspects: This EEG study was done with scalp electrodes positioned according to the 10-20 International system of electrode placement. Electrical activity was reviewed with band pass filter of 1-'70Hz'$ , sensitivity of 7 uV/mm, display speed of 77m/sec with a '60Hz'$  notched filter applied as appropriate. EEG data were recorded continuously and digitally stored.  Video monitoring was available and reviewed as appropriate. Description: EEG showed continuous generalized 3 to 6 Hz theta-delta slowing. Hyperventilation and photic stimulation were not performed.   ABNORMALITY - Continuous slow, generalized IMPRESSION: This study is suggestive of moderate diffuse encephalopathy, nonspecific etiology. No seizures or epileptiform discharges were seen throughout the recording. Priyanka [name]    Medications:    sodium chloride     anticoagulant sodium citrate     feeding supplement (VITAL AF 1.2 CAL) 1,000 mL (01/03/23 1646)   piperacillin-tazobactam (ZOSYN)  IV 3.375 g (01/03/23 1429)    heparin sodium (porcine)       Chlorhexidine Gluconate Cloth  6 each Topical Daily   docusate  100 mg Per Tube BID   fentaNYL (SUBLIMAZE) injection  100 mcg Intravenous Once   folic acid  1 mg Per Tube Daily   free water  30 mL Per Tube Q4H   heparin injection (subcutaneous)  5,000 Units Subcutaneous Q8H   insulin aspart  0-20 Units  Subcutaneous Q4H   insulin aspart  8 Units Subcutaneous Q4H   liver oil-zinc oxide   Topical QID   midazolam  2 mg Intravenous Once   multivitamin  1 tablet Per Tube QHS   nutrition supplement (JUVEN)  1 packet Per Tube BID BM   mouth rinse  15 mL Mouth Rinse Q2H   pantoprazole (PROTONIX) IV  40 mg Intravenous QHS   polyethylene glycol  17 g Per Tube Daily   [START ON 01/04/2023] thiamine  100 mg Per Tube Daily   vecuronium  10 mg Intravenous Once   venlafaxine  18.75 mg Per Tube BID WC   vitamin B-12  100 mcg Per Tube Daily    Assessment/ Plan:     Brett Blake is a 82 y.o.  male with a PMHx of advanced COPD, asthma, diabetes mellitus type 2, GERD, hyperlipidemia, hypertension, obstructive sleep apnea, anxiety, who was admitted to [facility] on 12/05/2022 for evaluation of severe tremors.  Patient has history of alcohol intake nightly.  He developed significant delirium tremens upon admission and subsequently intubated for airway protection.    #1: Acute kidney injury: Patient with acute kidney injury most likely secondary to acute tubular necrosis due to hemodynamic compromise.  Renal sonogram was negative.  Patient appears to be producing adequate amounts of urine.  Urine output 1.1 L over the patient 24 hours.  However patient has significant azotemia and critical care requests we perform hemodialysis treatment to make sure uremia not playing a role with his underlying mental status.  Patient has had poor responsiveness even before significant azotemia develop.  However we will perform short dialysis treatment later this evening to determine if improving the azotemia helps to improve his mental status.  #2: Acute respiratory failure: Continue ventilatory support.   #3: Diabetes: Continue insulin as per protocol.   #4: Sepsis: Remains on Zosyn     LOS: 12 Kmya Placide [facility] 1/9/20245:11 PM

## 2023-01-03 NOTE — Plan of Care (Signed)
[place] consulted noted. In to see patient, but patient is currently off unit and in procedure. Will reattempt at another time.

## 2023-01-03 NOTE — Progress Notes (Signed)
Several dialysis emergencies noted throughout the [facility] system this evening unfortunately.  Therefore hemodialysis not immediately available at the moment.  Case discussed with Dr. Mortimer Fries and patients daughter.  They have requested an alternative form of treatment.  Therefore we will perform CRRT at this time in order to help alleviate azotemia to determine if this is having an effect upon his mental status.  However as before prior to the azotemia the patient was still having poorly responsive mental status.  We have placed CRRT orders at this time.

## 2023-01-03 NOTE — Progress Notes (Signed)
NAME:  Brett Blake, MRN:  [id number], DOB:  [birth year], LOS: 12 ADMISSION DATE:  12/03/2022, CONSULTATION DATE: 12/25/2022 REFERRING MD: Dr. Blaine Hamper, CHIEF COMPLAINT: AMS   History of Present Illness:  This is an 82 yo male with hx of ETOH Abuse who presented to [facility] ER on 12/28 via EMS from home following a possible seizure activity the morning of 12/28.  He has a hx of non epileptic seizures, however he does not take antiepileptics.  He reported he has not had a seizure in a long time.  All hx obtained from pts chart and daughter.   ED Course Upon arrival to the ER pt noted to have severe tremors.  Pt reported he drinks 1 small alcoholic beverage every night prior to going to bed.  However, pts wife reported he drank hard liquor heavily over the past several days and abruptly stopped drinking 2 days ago.  He also complained of an upset stomach.  ER lab results revealed K+ 3.1/glucose 138/calcium 8.3/albumin 3.4/AST 81/ALT 62/total protein 6.4/magnesium 1.6/wbc 3.9/hgb 12.1/platelets 125.  COVID-19/Influenza A&B negative.  Pt had a CIWA score of 4, he received 1 mg of iv ativan.  Hospitalist initially contacted for hospital admission.  However, pt became increasingly agitated and combative despite receiving a total of 3 mg of iv ativan and 3 mg of IM haldol.  Security called to bedside.  PCCM team consulted due to pt requiring precedex gtt.  However, despite precedex gtt at maximal dose; additional 2 mg of iv ativan, 12.5 mg of iv fentanyl, and 2.5 mg of iv haldol pt remained combative/agitated requiring mechanical intubation.  Pt admitted to ICU per PCCM team for additional workup and treatment.    CT Head 12/28:  No evidence of acute intracranial abnormality. Mild chronic small vessel ischemic disease. CXR 12/28: No active cardiopulmonary disease.   Please see "significant hospital events" section below for full detailed hospital course.  Pertinent  Medical History  Anxiety  Arthritis   Asthma  Colon Polyps  COPD Depression  Type II Diabetes Mellitus Difficult Intubation  GERD  Hypercholesteremia  HLD HTN  Hyponatremia  OSA   Micro Data:  12/28: SARS-CoV-2 and influenza PCR>> negative 12/28: MRSA PCR>> negative 1/5: Tracheal aspirate>>ENTEROBACTER CLOACAE, STAPHYLOCOCCUS AUREUS  1/6: MRSA PCR>>negative  Antimicrobials:   Anti-infectives (From admission, onward)    Start     Dose/Rate Route Frequency Ordered Stop   12/30/22 2200  piperacillin-tazobactam (ZOSYN) IVPB 3.375 g        3.375 g 12.5 mL/hr over 240 Minutes Intravenous Every 8 hours 12/30/22 1902 01/04/23 0559       Significant Hospital Events: Including procedures, antibiotic start and stop dates in addition to other pertinent events   12/28: Pt admitted with possible seizure activity suspect secondary to severe ETOH withdrawal requiring mechanical intubation  12/28: CT Head>No evidence of acute intracranial abnormality. Mild chronic small vessel ischemic disease.  12/23/22- patient still having seizures despite propofol, the seizures do respond to Ativan.  Pharmacy is monitoring electrolytes.  He is weaning off levophed.  He will start feeds today.  ABG with res alkalosis and RR was reduced.  12/24/22- patient improved, dcd phenobarb and wake up assessment was very poor today with onset of tachyarrythmia in 140s and seizure like activity upon reduction of sedation.  12/25/22-  patient is critically ill, he is not tolerating reduction of sedation with emergence of seizure like activity and tachyarrythmia. Wife and nephew at bedside we discussed short term plan. Today  we have transitioned to herparin from lovenox due to worsening renal impairment.  12/26/22- patient was not able to tolerate reduction in sedation with devt of severe hypertension and tachyarrythmia 12/27/22:  AKI slowly improving.  Start Precedex to assist with WUA. 12/28/22: MR Brain WO Contrast>no acute or reversible finding. Generalized  age  related atrophy. Minimal small vessel change of the cerebral hemispheric white matter, less than often seen at this age. Chronic nonprogressive benign type  calcification within the posterolateral left thalamus. Sinus mucosal thickening and  layering fluid as often seen in patients with mechanical ventilation. Mastoid effusions as often seen in patients with mechanical ventilation.  12/29/22: Pt failed WUA  12/30/22: EEG negative for electrographic seizures, however severe diffuse slowing indicative of global cerebral dysfunction, medication effect, or both  12/30/22: No acute events overnight. Pt remains mechanically intubated FiO2 35%. Will perform WUA and possible SBT  01/01/23: Pt with continued poor neurological exam remains unresponsive despite not receiving sedating medications since 12/30/22.  CT Head and EEG negative.  Remains on minimal mechanical ventilator settings, however neurological exam impeding extubation 01/02/23:   Neuro exam unchanged which is precluding extubation.  Consult Neurology,  IR to evaluate for LP.  Consult Palliative Care for [place].  Code status changed to DNR.  HD catheter placed as family wishes to trial HD to see if would improve mental status. 01/03/23: Slightly more responsive this morning. ETT cuff with severe leak requiring exchange of ETT.  Plan for trial of HD.  Plan for LP with IR  Interim History / Subjective:  -No significant events noted overnight -Afebrile, hemodynamically stable, no vasopressors -On minimal vent support (40% FiO2), mental status currently precluding extubation ~ will exercise in PSV as tolerates -Has been off sedation since 1/5, Neuro slightly better today as pt is withdrawing and trying to open eyes to stimulation ~ CT Head, MRI, and EEG all negative ~ Neuro following -Plan for LP by IR today to rule out CNS infection vs. CNS autoimmune Inflammatory process -Creatinine stable at 1.9 from 1.93 , BUN 96 from  98, UOP 1.1L last 24 hrs (net + 2.4  L) ~ Nephrology is following -Family wishes to give trial of HD to see if mental status improves ~ HD catheter was placed yesterday  Objective   Blood pressure (!) 162/81, pulse (!) 102, temperature 98.7 F (37.1 C), temperature source Oral, resp. rate (!) 24, height '5\' 4"'$  (1.626 m), weight 83.1 kg, SpO2 93 %.    Vent Mode: PRVC FiO2 (%):  [40 %] 40 % Set Rate:  [16 bmp] 16 bmp Vt Set:  [400 mL] 400 mL PEEP:  [5 cmH20] 5 cmH20 Pressure Support:  [10 cmH20] 10 cmH20 Plateau Pressure:  [22 cmH20] 22 cmH20   Intake/Output Summary (Last 24 hours) at 01/03/2023 0726 Last data filed at 01/03/2023 0555 Gross per 24 hour  Intake 1278.95 ml  Output 2850 ml  Net -1571.05 ml    Filed Weights   12/31/22 0500 01/02/23 0500 01/03/23 0423  Weight: 85.9 kg 83.4 kg 83.1 kg    Examination: General: Acutely on chronically-ill appearing male, mechanically ventilated.  In NAD HENT: Supple, difficult to assess for JVD due to body habitus, orally intubated Lungs: Coarse breath sounds throughout, no wheezing or rhonchi noted, even, non labored, occasionally overbreathes the vent Cardiovascular: NSR, s1s2, no r/g, 2+ radial/1+ distal, 2+ generalized edema  Abdomen: +BS x4, obese, soft, distended, rectal tube in place with stool present  Extremities: Normal bulk and tone,  no deformities Neuro: Off sedation, corneal and gag reflexes present, PERRL, withdrawing and grimacing from pain, starting to open eyes to stimulation GU: Indwelling foley catheter draining yellow urine  Resolved Hospital Problem list     Assessment & Plan:   Intubation and mechanical ventilation for airway protection in the setting of ETOH withdrawal  Hx: OSA, COPD, Asthma, and Difficult Intubation  - Full vent support, implement lung protective strategies - Plateau pressures less than 30 cm H20 - Wean FiO2 & PEEP as tolerated to maintain O2 sats 88 to 92% - Follow intermittent Chest X-ray & ABG as needed - Spontaneous Breathing  Trials when respiratory parameters met and mental status permits - Continue VAP Bundle - Bronchodilators & Pulmicort nebs  Hypertension Tachycardia, due to DT's Hx: Hypercholesteremia   Echo 12/31/22: EF 60 to 35%; grade I diastolic dysfunction  - Continuous cardiac monitoring - Continue propranolol and prn hydralazine for bp management  - Continue outpatient rosuvastatin   Acute kidney injury secondary to ATN - Renal US negative - Trend BMP  - Replace electrolytes as indicated  - Monitor UOP  - Avoid nephrotoxic medications - Nephrology following, appreciate input ~ plan for trial of HD to see if helps with encephalopathy  LLL pneumonia (ENTEROBACTER CLOACAE, STAPHYLOCOCCUS AUREUS) Fever -Monitor fever curve -Trend WBC's & Procalcitonin -Follow cultures as above -Continue empiric Zosyn pending cultures & sensitivities  Type II diabetes mellitus  - CBG's q4h; Target range of 140 to 180 - SSI and scheduled novolog  - Follow ICU Hypo/Hyperglycemia protocol  Possible seizure activity in the setting of ETOH withdrawal  Delirium Tremens Acute Metabolic Encephalopathy  Sedation needs in setting of mechanical ventilation CT Head on admission: negative for acute intracranial abnormality EEG 12/23/22: suggestive of severe to profound diffuse encephalopathy, nonspecific etiology but likely related to sedation. No seizures or epileptiform discharges were seen throughout the recording.  MRI 1/3: with no acute or reversible finding. Chronic age related changes EEG 1/5: negative for seizure activity  -Treatment of metabolic derangements as outlined above - Maintain a RASS goal of 0  - Currently off sedation - Avoid sedating medications as able - Daily wake up assessment  - Seizure precautions  - CIWA protocol - Continue thiamine, folic acid, and MVI - Continue lactulose: goal 1L of stool daily  - IR consulted for LP to rule out CNS infection vs. CNS autoimmune Inflammatory  process -Neurology following, appreciate input -Plan for trial of HD     Pt is critically ill.  Prognosis is guarded and overall long term prognosis is poor.  Pt is DNR status.  Palliative Care consulted to assist with [place].   Best Practice (right click and "Reselect all SmartList Selections" daily)   Diet/type: NPO, tube feeds DVT prophylaxis: Heparin SQ GI prophylaxis: H2B Lines: Left IJ Trialysis, and is still needed Foley:  Yes, and it is still needed Code Status:  DNR Last date of multidisciplinary goals of care discussion [01/03/23]  01/03/23: Updated pt's wife and daughter at bedside   Labs   CBC: Recent Labs  Lab 12/30/22 0440 12/31/22 0637 01/01/23 0600 01/02/23 0442 01/03/23 0507  WBC 8.0 13.5* 11.7* 11.3* 13.4*  NEUTROABS  --   --   --  9.6*  --   HGB 9.2* 10.1* 9.5* 9.5* 9.3*  HCT 28.9* 30.9* 29.4* 29.9* 28.9*  MCV 100.0 99.4 99.7 101.0* 100.7*  PLT 210 290 305 336 389     Basic Metabolic Panel: Recent Labs  Lab 12/31/22 262 439 0348  01/01/23 0557 01/01/23 0600 01/01/23 1756 01/02/23 0442 01/03/23 0507  NA 142  --  143 141 143 145  K 4.9  --  4.7 4.1  4.1 4.1 4.2  CL 104  --  105 102 102 106  CO2 26  --  '26 26 28 26  '$ GLUCOSE 226*  --  218* 274* 228* 210*  BUN 79*  --  92* 98* 98* 96*  CREATININE 1.96*  --  1.92* 1.85* 1.93* 1.92*  CALCIUM 8.8*  --  8.6* 8.7* 8.8* 8.9  MG 2.3  --  2.3 2.4 2.4 2.4  PHOS 2.8 3.4  --  3.8 3.4 3.2    GFR: Estimated Creatinine Clearance: 29.9 mL/min (A) (by C-G formula based on SCr of 1.92 mg/dL (H)). Recent Labs  Lab 12/30/22 0436 12/30/22 0440 12/31/22 0637 01/01/23 0600 01/02/23 0442 01/03/23 0507  PROCALCITON 1.05  --  1.63  --   --   --   WBC  --    < > 13.5* 11.7* 11.3* 13.4*   < > = values in this interval not displayed.     Liver Function Tests: Recent Labs  Lab 12/28/22 0322 01/01/23 0600 01/03/23 0507  AST  --  52*  --   ALT  --  33  --   ALKPHOS  --  106  --   BILITOT  --  1.0  --   PROT  --   6.7  --   ALBUMIN 2.1* 1.8* 1.9*    No results for input(s): "LIPASE", "AMYLASE" in the last 168 hours. Recent Labs  Lab 12/29/22 1113 01/01/23 1207  AMMONIA 44* 57*     ABG    Component Value Date/Time   PHART 7.44 12/31/2022 0855   PCO2ART 45 12/31/2022 0855   PO2ART 64 (L) 12/31/2022 0855   HCO3 30.6 (H) 12/31/2022 0855   ACIDBASEDEF 3.3 (H) 12/29/2022 0206   O2SAT 94.2 12/31/2022 0855     Coagulation Profile: Recent Labs  Lab 01/01/23 1623  INR 1.3*     Cardiac Enzymes: No results for input(s): "CKTOTAL", "CKMB", "CKMBINDEX", "TROPONINI" in the last 168 hours.   HbA1C: Hgb A1c MFr Bld  Date/Time Value Ref Range Status  11/20/2022 05:04 AM 7.4 (H) 4.8 - 5.6 % Final    Comment:    (NOTE)         Prediabetes: 5.7 - 6.4         Diabetes: >6.4         Glycemic control for adults with diabetes: <7.0   02/15/2022 05:57 AM 5.9 (H) 4.8 - 5.6 % Final    Comment:    (NOTE)         Prediabetes: 5.7 - 6.4         Diabetes: >6.4         Glycemic control for adults with diabetes: <7.0     CBG: Recent Labs  Lab 01/02/23 1109 01/02/23 1635 01/02/23 1932 01/02/23 2318 01/03/23 0343  GLUCAP 198* 197* 166* 147* 206*     Review of Systems:   Unable to assess pt mechanically intubated, AMS, critical illness  Past Medical History:  He,  has a past medical history of Anxiety, Arthritis, Asthma, Colon polyps, COPD (chronic obstructive pulmonary disease) ([place]), Depression, Diabetes ([place]), Difficult intubation, GERD (gastroesophageal reflux disease), High cholesterol, Hyperlipidemia, Hypertension, Hyponatremia (02/23/2022), and Sleep apnea.   Surgical History:   Past Surgical History:  Procedure Laterality Date   BRONCHOSCOPY     CARDIAC CATHETERIZATION  Left 11/04/2016   Procedure: Left Heart Cath and Coronary Angiography;  Surgeon: Teodoro Spray, MD;  Location: [facility];  Service: Cardiovascular;  Laterality: Left;   CARDIAC CATHETERIZATION      CHOLECYSTECTOMY N/A 05/09/2019   Procedure: LAPAROSCOPIC CHOLECYSTECTOMY WITH INTRAOPERATIVE CHOLANGIOGRAM;  Surgeon: Jules Husbands, MD;  Location: [facility];  Service: General;  Laterality: N/A;   COLOSTOMY     IR KYPHO LUMBAR INC FX REDUCE BONE BX UNI/BIL CANNULATION INC/IMAGING  11/10/2022   IR RADIOLOGIST EVAL & MGMT  11/08/2022   LEFT HEART CATH AND CORONARY ANGIOGRAPHY N/A 12/14/2021   Procedure: LEFT HEART CATH AND CORONARY ANGIOGRAPHY;  Surgeon: Corey Skains, MD;  Location: [facility];  Service: Cardiovascular;  Laterality: N/A;   ROTATOR CUFF REPAIR Left    THUMB ARTHROSCOPY  2015   TONSILLECTOMY  1952   TONSILLECTOMY       Social History:   reports that he has quit smoking. His smoking use included cigarettes. He has a 15.00 pack-year smoking history. He has never used smokeless tobacco. He reports current alcohol use of about 1.0 - 2.0 standard drink of alcohol per week. He reports that he does not use drugs.   Family History:  His family history includes Heart attack in his mother; Leukemia in his brother; Prostate cancer in his brother and father.   Allergies Allergies  Allergen Reactions   Ace Inhibitors Cough   Metoprolol Diarrhea    Patient has been tolerating Propranolol 40 mg twice daily since at least 05/2022.   Nsaids Rash   Sulfa Antibiotics Rash     Home Medications  Prior to Admission medications   Medication Sig Start Date End Date Taking? Authorizing Provider  albuterol (ACCUNEB) 1.25 MG/3ML nebulizer solution Take 3 mLs by nebulization every 6 (six) hours as needed for wheezing. 11/20/22  Yes Wieting, Richard, MD  ALPRAZolam Duanne Moron) 0.5 MG tablet Take 0.5 mg by mouth daily as needed for anxiety. 10/06/22 10/06/23 Yes [provider]  aspirin EC 81 MG tablet Take 81 mg by mouth daily. Swallow whole.   Yes [provider]  diltiazem (CARDIZEM CD) 240 MG 24 hr capsule Take 240 mg by mouth every morning. 12/07/21  Yes [provider]  donepezil (ARICEPT) 5 MG tablet Take 5 mg by mouth at bedtime. 05/10/22  Yes [provider]  fluticasone (FLONASE) 50 MCG/ACT nasal spray Place 1 spray into both nostrils daily as needed for allergies or rhinitis.   Yes [provider]  folic acid (FOLVITE) 1 MG tablet Take 1 tablet (1 mg total) by mouth daily. 03/01/22  Yes Wieting, Richard, MD  gabapentin (NEURONTIN) 300 MG capsule Take 1 capsule (300 mg total) by mouth at bedtime. 02/28/22  Yes Wieting, Richard, MD  melatonin 5 MG TABS Take 1 tablet (5 mg total) by mouth at bedtime. 02/28/22  Yes Wieting, Richard, MD  nitroGLYCERIN (NITROSTAT) 0.4 MG SL tablet Place 0.4 mg under the tongue every 5 (five) minutes as needed for chest pain.   Yes [provider]  omeprazole (PRILOSEC) 40 MG capsule Take 40 mg by mouth daily. 04/20/22  Yes [provider]  ondansetron (ZOFRAN-ODT) 4 MG disintegrating tablet Take 1 tablet (4 mg total) by mouth every 8 (eight) hours as needed for nausea or vomiting. 06/11/22  Yes Carrie Mew, MD  propranolol (INDERAL) 40 MG tablet Take 40 mg by mouth 2 (two) times daily. 11/07/22  Yes [provider]  QUEtiapine (SEROQUEL)  25 MG tablet One tab po qhs (can take extra dose if not sleeping) 02/28/22  Yes Wieting, Richard, MD  rosuvastatin (CRESTOR) 5 MG tablet Take 5 mg by mouth daily. 05/10/22  Yes [provider]  SYMBICORT 80-4.5 MCG/ACT inhaler Inhale 2 puffs into the lungs 2 (two) times daily as needed.  05/19/20  Yes [provider]  venlafaxine XR (EFFEXOR-XR) 37.5 MG 24 hr capsule Take 37.5 mg by mouth daily. 12/07/21  Yes [provider]  azithromycin (ZITHROMAX) 250 MG tablet One tab po daily for three days Patient not taking: Reported on 12/16/2022 11/21/22   Loletha Grayer, MD  guaiFENesin (ROBITUSSIN) 100 MG/5ML liquid Take 5 mLs by mouth every 4 (four) hours as needed for cough or to loosen phlegm. Patient not taking:  Reported on 12/16/2022 02/28/22   Loletha Grayer, MD  polyethylene glycol (MIRALAX / GLYCOLAX) 17 g packet Take 17 g by mouth daily as needed for moderate constipation. Patient not taking: Reported on 11/08/2022 02/28/22   Loletha Grayer, MD     Scheduled Meds:  Chlorhexidine Gluconate Cloth  6 each Topical Daily   docusate  100 mg Per Tube BID   folic acid  1 mg Per Tube Daily   free water  30 mL Per Tube Q4H   heparin injection (subcutaneous)  5,000 Units Subcutaneous Q8H   insulin aspart  0-20 Units Subcutaneous Q4H   insulin aspart  8 Units Subcutaneous Q4H   lactulose  20 g Oral TID   multivitamin with minerals  1 tablet Per Tube Daily   mouth rinse  15 mL Mouth Rinse Q2H   pantoprazole (PROTONIX) IV  40 mg Intravenous QHS   polyethylene glycol  17 g Per Tube Daily   thiamine (VITAMIN B1) injection  100 mg Intravenous Daily   venlafaxine  18.75 mg Per Tube BID WC   vitamin B-12  100 mcg Per Tube Daily   Continuous Infusions:  sodium chloride     feeding supplement (VITAL AF 1.2 CAL) 70 mL/hr at 01/02/23 2343   piperacillin-tazobactam (ZOSYN)  IV 3.375 g (01/03/23 0553)   PRN Meds:.albuterol, fentaNYL (SUBLIMAZE) injection, heparin sodium (porcine), hydrALAZINE, mouth rinse   Critical care time:  40 minutes    Darel Hong, AGACNP-BC [facility] Prefer epic messenger for cross cover needs If after hours, please call E-link

## 2023-01-03 NOTE — Procedures (Signed)
Technically successful fluoro guided LP at L3-L4 level with opening pressure of 25 cm H2O and closing pressure of 15 H2O 10 cc of clear CSF sent to lab for analysis.  No immediate post procedural complication.  Please see imaging section of Epic for full dictation.    Reatha Armour, PA-C 01/03/2023, 4:04 PM

## 2023-01-03 NOTE — Progress Notes (Signed)
Hold on HD for patient. Emergent patient to be dialyzed first.

## 2023-01-03 NOTE — Consult Note (Signed)
Progress note - Neurology    S:// Patient seen and examined.  Needed ET tube exchange this morning.  Nephrology called for dialysis.  Awaiting LP  Medications  Current Facility-Administered Medications:    0.9 %  sodium chloride infusion, 250 mL, Intravenous, Continuous, Meda Coffee, Dreama Saa, NP   albuterol (PROVENTIL) (2.5 MG/3ML) 0.083% nebulizer solution 2.5 mg, 2.5 mg, Nebulization, Q4H PRN, Alison Murray, RPH   Chlorhexidine Gluconate Cloth 2 % PADS 6 each, 6 each, Topical, Daily, [place], Bing Neighbors, NP, 6 each at 01/02/23 0959   docusate (COLACE) 50 MG/5ML liquid 100 mg, 100 mg, Per Tube, BID, Teressa Lower, NP, 100 mg at 01/02/23 0959   feeding supplement (VITAL AF 1.2 CAL) liquid 1,000 mL, 1,000 mL, Per Tube, Continuous, Flora Lipps, MD, Last Rate: 70 mL/hr at 01/02/23 2343, Infusion Verify at 01/02/23 2343   fentaNYL (SUBLIMAZE) injection 25 mcg, 25 mcg, Intravenous, Q1H PRN, Tonye Royalty, NP, 25 mcg at 93/23/55 7322   folic acid (FOLVITE) tablet 1 mg, 1 mg, Per Tube, Daily, Wynelle Cleveland, RPH, 1 mg at 01/02/23 0959   free water 30 mL, 30 mL, Per Tube, Q4H, Ottie Glazier, MD, 30 mL at 01/02/23 2343   heparin injection 5,000 Units, 5,000 Units, Subcutaneous, Q8H, Ottie Glazier, MD, 5,000 Units at 01/03/23 0552   heparin sodium (porcine) injection 2,800 Units, 2,800 Units, Intravenous, BID PRN, Rust-Chester, Toribio Harbour L, NP   hydrALAZINE (APRESOLINE) injection 10-20 mg, 10-20 mg, Intravenous, Q6H PRN, Teressa Lower, NP, 20 mg at 01/03/23 0326   insulin aspart (novoLOG) injection 0-20 Units, 0-20 Units, Subcutaneous, Q4H, Rust-Chester, Huel Cote, NP, 4 Units at 01/03/23 0911   insulin aspart (novoLOG) injection 8 Units, 8 Units, Subcutaneous, Q4H, Rust-Chester, Huel Cote, NP, 8 Units at 01/02/23 2115   liver oil-zinc oxide (DESITIN) 40 % ointment, , Topical, QID, Flora Lipps, MD, Given at 01/03/23 1128   multivitamin with minerals tablet 1 tablet, 1 tablet, Per Tube,  Daily, Wynelle Cleveland, RPH, 1 tablet at 01/02/23 0254   nutrition supplement (JUVEN) (JUVEN) powder packet 1 packet, 1 packet, Per Tube, BID BM, Flora Lipps, MD   Oral care mouth rinse, 15 mL, Mouth Rinse, Q2H, Aleskerov, Fuad, MD, 15 mL at 01/03/23 1100   Oral care mouth rinse, 15 mL, Mouth Rinse, PRN, Ottie Glazier, MD   pantoprazole (PROTONIX) injection 40 mg, 40 mg, Intravenous, QHS, Dgayli, Khabib, MD, 40 mg at 01/02/23 2250   piperacillin-tazobactam (ZOSYN) IVPB 3.375 g, 3.375 g, Intravenous, Q8H, Kasa, Maretta Bees, MD, Last Rate: 12.5 mL/hr at 01/03/23 0553, 3.375 g at 01/03/23 0553   polyethylene glycol (MIRALAX / GLYCOLAX) packet 17 g, 17 g, Per Tube, Daily, Teressa Lower, NP, 17 g at 01/02/23 0959   [COMPLETED] thiamine (VITAMIN B1) 500 mg in normal saline (50 mL) IVPB, 500 mg, Intravenous, Daily, Stopped at 12/25/22 1156 **FOLLOWED BY** thiamine (VITAMIN B1) injection 100 mg, 100 mg, Intravenous, Daily, Rust-Chester, Toribio Harbour L, NP, 100 mg at 01/02/23 0959   venlafaxine (EFFEXOR) tablet 18.75 mg, 18.75 mg, Per Tube, BID WC, Coulter, Carolyn, RPH, 18.75 mg at 01/02/23 1638   vitamin B-12 (CYANOCOBALAMIN) tablet 100 mcg, 100 mcg, Per Tube, Daily, Dgayli, Khabib, MD, 100 mcg at 01/02/23 1000   Exam: Current vital signs: BP (!) 163/85   Pulse 89   Temp 98 F (36.7 C) (Oral)   Resp 20   Ht '5\' 4"'$  (1.626 m)   Wt 83.1 kg   SpO2 96%   BMI 31.45 kg/m  Vital signs in last 24 hours: Temp:  [98 F (36.7 C)-99.1 F (37.3 C)] 98 F (36.7 C) (01/09 0800) Pulse Rate:  [78-104] 89 (01/09 1000) Resp:  [20-32] 20 (01/09 1000) BP: (131-181)/(55-85) 163/85 (01/09 1000) SpO2:  [91 %-97 %] 96 % (01/09 1000) FiO2 (%):  [40 %] 40 % (01/09 0800) Weight:  [83.1 kg] 83.1 kg (01/09 0423) General: Intubated, no sedation HEENT: Normocephalic, atraumatic CVs: Regular rhythm Abdomen nondistended nontender Respiratory: Vented Extremities warm well-perfused Neurological exam Intubated, no  sedation No spontaneous movement To very noxious stimulation external rub, opens eyes more consistently today. Does not follow commands Nonverbal Cranial nerves: Pupils are equal sluggishly reactive to light, extraocular movements difficult to assess but has oculocephalic reflexes as well as gaze appears to be midline, facial symmetry difficult to ascertain due to the endotracheal tube. Motor examination with no spontaneous movement.  Eye-opening to noxious stimulation with mild grimace and flicker movement in lower extremities. Sensory examination: As above  Labs I have reviewed labs in epic and the results pertinent to this consultation are:  CBC    Component Value Date/Time   WBC 13.4 (H) 01/03/2023 0507   RBC 2.87 (L) 01/03/2023 0507   HGB 9.3 (L) 01/03/2023 0507   HCT 28.9 (L) 01/03/2023 0507   PLT 389 01/03/2023 0507   MCV 100.7 (H) 01/03/2023 0507   MCH 32.4 01/03/2023 0507   MCHC 32.2 01/03/2023 0507   RDW 20.1 (H) 01/03/2023 0507   LYMPHSABS 0.5 (L) 01/02/2023 0442   MONOABS 0.7 01/02/2023 0442   EOSABS 0.1 01/02/2023 0442   BASOSABS 0.1 01/02/2023 0442   CMP     Component Value Date/Time   NA 145 01/03/2023 0507   K 4.2 01/03/2023 0507   CL 106 01/03/2023 0507   CO2 26 01/03/2023 0507   GLUCOSE 210 (H) 01/03/2023 0507   BUN 96 (H) 01/03/2023 0507   CREATININE 1.92 (H) 01/03/2023 0507   CALCIUM 8.9 01/03/2023 0507   PROT 6.7 01/01/2023 0600   ALBUMIN 1.9 (L) 01/03/2023 0507   AST 52 (H) 01/01/2023 0600   ALT 33 01/01/2023 0600   ALKPHOS 106 01/01/2023 0600   BILITOT 1.0 01/01/2023 0600   GFRNONAA 35 (L) 01/03/2023 0507   GFRAA 52 (L) 07/16/2020 1408  TSH 3.7 B12 1 year ago was 279.  Imaging I have reviewed the images obtained: MRI brain done on December 28, 2022 with no acute or reversible finding for altered mental status or seizure.  Chronic calcification in posterior lateral left thalamus, age-related generalized atrophy.  Neurodiagnostics EEG  12/30/2022 with severe diffuse slowing while on propofol.  No seizures seen. EEG on 12/23/2022 with generalized burst attenuation suggestive of severe to profound diffuse encephalopathy but likely related to sedation.  No seizures seen.  Assessment: 82 year old man past history of alcohol abuse COPD hypertension diabetes coronary artery disease history of nonepileptic spells memory changes presenting for complaints of seizures all over, initially reporting drinking 1 drink a night but turns out had been abusing alcohol for the past few days with sudden discontinuation 2 days prior to presentation.  Admitted to the ICU for delirium tremens.  Also noted to have AKI which is being corrected.  Mild hyperammonemia also noted which is being treated by the primary team. Has been off of sedation for the past few days and is still very unresponsive for which neurological consultation was obtained. MRI with no evidence of stroke or reversible causes of altered mental status or seizures. EEG with  profound background suppression due to sedation-no seizures. Clinical presentation likely consistent with toxic metabolic encephalopathy due to multiple metabolic derangements and likely alcohol withdrawal/delirium tremens making this more protracted than to be expected. Suspicion for underlying CNS infection or inflammatory process but the LP had been ordered by a prior provider and pending. Repeat EEG with no evidence of seizures  Recommendations: Management of toxic metabolic derangements per primary team as you are.  Being dialyzed today-I think that would help. I am not sure if his clinical picture fits that of a CNS infection or CNS autoimmune inflammatory process but the primary team has requested IR for a fluoroscopy guided LP, which at least would be of use to rule out acute infection or acute inflammatory process, which might be reversible. I suspect that his presentation is more consistent with alcohol related  delirium tremens and metabolic encephalopathy rather than an infectious or CNS inflammatory cause and because of his prior alcohol abuse, he may take time to come around even after correction of his metabolic derangements and his lab tests that have now started to trend in a positive direction. Historically has had low B12-continue B12 and thiamine replenishment.  I will continue to follow with you. Plan was discussed with the CCM team as well as patient family at bedside.  -- Amie Portland, MD Neurologist [facility] Pager: [phone]  CRITICAL CARE ATTESTATION Performed by: Amie Portland, MD Total critical care time: 33 minutes Critical care time was exclusive of separately billable procedures and treating other patients and/or supervising APPs/Residents/Students Critical care was necessary to treat or prevent imminent or life-threatening deterioration. This patient is critically ill and at significant risk for neurological worsening and/or death and care requires constant monitoring. Critical care was time spent personally by me on the following activities: development of treatment plan with patient and/or surrogate as well as nursing, discussions with consultants, evaluation of patient's response to treatment, examination of patient, obtaining history from patient or surrogate, ordering and performing treatments and interventions, ordering and review of laboratory studies, ordering and review of radiographic studies, pulse oximetry, re-evaluation of patient's condition, participation in multidisciplinary rounds and medical decision making of high complexity in the care of this patient.

## 2023-01-03 NOTE — Procedures (Signed)
Chest Tube Insertion: Indication:RT PTX Consent:VERBAL/WRITTEN  Risks and benefits explained in detail including risk of infection, bleeding, respiratory failure and death..   Hand washing performed prior to starting the procedure.    Procedure: An active timeout was performed and correct patient, name, & ID confirmed.  After explaining risks and benefits, patient positioned correctly for chest tube placement. Patient was prepped in a sterile fashion including chlorohexadine preps, sterile drape, sterile gown and sterile gloves. Introducer needle was placed and guide wire was inserted. Using Seldinger Technique, the introducer needle was removed and three dilators were used to create an opening for insertion of chest tube. Chest tube sutured in place and proper dressing used..   Findings: Gush if air heard upon insetion of needle, Chest tube placed bewteen 5-7 ribs midaxiilary line.   Chest tube connected to: pleurovac .   Number of Attempts:1  Complications:none  Estimated Blood Loss:none  Chest radiograph indicated and ordered.   Operator: Michelene Gardener, M.D.  [facility]  Medical Director [place] Director [facility] Cardio-Pulmonary Department

## 2023-01-03 NOTE — Plan of Care (Signed)
Patient noted to have cuff leak from ETT, family was made aware of exchange of ETT and complications.  Tube was exchanged over bougie Size 8.5 ETT Tube secured at 23 cm at lip  Post intubation CXR shows RT sided PTX  Family updated and notified, consent for Chest tube obtained Chest tube placed without issues CXR pending

## 2023-01-03 NOTE — Consult Note (Signed)
WOC Nurse Consult Note: Reason for Consult:deep tissue pressure injury to sacrum in the presence of critical illness intubation, sedation, multiple comorbid conditions. Braden Scale Score is 10 (<12 is very high risk), on 12/31/22 Braden Scale Score was 8. GOC are being discussed with family.  Wound type:pressure vs perfusion injury Pressure Injury POA: Yes Measurement:To be obtained by bedside RN with application of next dressing today. Discussed via Secure Chat with Neldon Newport. Wound bed: deeply hued area of discoloration with epidermal sloughing to reveal a pink wound bed Drainage (amount, consistency, odor) serous, scant Periwound:intact, moist erythematous Dressing procedure/placement/frequency:Patient is on a mattress replacement with low air loss feature as he is in ICU and he is being turned and repositioned as able with labile vital signs. When a full turn effort is not tolerated, micro shifts in position are being performed. A sacral foam has been in use, I have added a wound contact layer of xeroform gauze to the wound treatment plan. This is to be topped with the silicone foam and changed daily, It will serves as both an antimicrobial and an nonadherent.  Bilateral Prevalon pressure redistribution heel boots are also provided.Zinc oxide is to be applied to the perineum and perianal area as needed to manage microclimate.  [place] nursing team will not follow, but will remain available to this patient, the nursing and medical teams.  Please re-consult if needed.  Thank you for inviting [place] to participate in this patient's Plan of Care.  Maudie Flakes, MSN, RN, CNS, [place], Serita Grammes, [employer], [employer] phone:  [phone]

## 2023-01-03 NOTE — Progress Notes (Signed)
CRRT initiated without issue. Plan per providers is to keep CRRT running for 4 hours.  Cameron Ali, RN

## 2023-01-04 ENCOUNTER — Inpatient Hospital Stay: Payer: Medicare Other

## 2023-01-04 DIAGNOSIS — R569 Unspecified convulsions: Secondary | ICD-10-CM | POA: Diagnosis not present

## 2023-01-04 DIAGNOSIS — Z7189 Other specified counseling: Secondary | ICD-10-CM | POA: Diagnosis not present

## 2023-01-04 LAB — CBC
HCT: 26.2 % — ABNORMAL LOW (ref 39.0–52.0)
Hemoglobin: 8.2 g/dL — ABNORMAL LOW (ref 13.0–17.0)
MCH: 32 pg (ref 26.0–34.0)
MCHC: 31.3 g/dL (ref 30.0–36.0)
MCV: 102.3 fL — ABNORMAL HIGH (ref 80.0–100.0)
Platelets: 387 10*3/uL (ref 150–400)
RBC: 2.56 MIL/uL — ABNORMAL LOW (ref 4.22–5.81)
RDW: 19.7 % — ABNORMAL HIGH (ref 11.5–15.5)
WBC: 11.4 10*3/uL — ABNORMAL HIGH (ref 4.0–10.5)
nRBC: 3.5 % — ABNORMAL HIGH (ref 0.0–0.2)

## 2023-01-04 LAB — RENAL FUNCTION PANEL
Albumin: 1.8 g/dL — ABNORMAL LOW (ref 3.5–5.0)
Anion gap: 10 (ref 5–15)
BUN: 91 mg/dL — ABNORMAL HIGH (ref 8–23)
CO2: 28 mmol/L (ref 22–32)
Calcium: 8.5 mg/dL — ABNORMAL LOW (ref 8.9–10.3)
Chloride: 108 mmol/L (ref 98–111)
Creatinine, Ser: 1.63 mg/dL — ABNORMAL HIGH (ref 0.61–1.24)
GFR, Estimated: 42 mL/min — ABNORMAL LOW (ref >60)
Glucose, Bld: 182 mg/dL — ABNORMAL HIGH (ref 70–99)
Phosphorus: 2.6 mg/dL (ref 2.5–4.6)
Potassium: 4.1 mmol/L (ref 3.5–5.1)
Sodium: 146 mmol/L — ABNORMAL HIGH (ref 135–145)

## 2023-01-04 LAB — GLUCOSE, CAPILLARY
Glucose-Capillary: 162 mg/dL — ABNORMAL HIGH (ref 70–99)
Glucose-Capillary: 188 mg/dL — ABNORMAL HIGH (ref 70–99)
Glucose-Capillary: 179 mg/dL — ABNORMAL HIGH (ref 70–99)
Glucose-Capillary: 179 mg/dL — ABNORMAL HIGH (ref 70–99)
Glucose-Capillary: 226 mg/dL — ABNORMAL HIGH (ref 70–99)
Glucose-Capillary: 167 mg/dL — ABNORMAL HIGH (ref 70–99)

## 2023-01-04 LAB — TROPONIN I (HIGH SENSITIVITY): Troponin I (High Sensitivity): 84 ng/L — ABNORMAL HIGH (ref <18)

## 2023-01-04 LAB — MAGNESIUM: Magnesium: 2.2 mg/dL (ref 1.7–2.4)

## 2023-01-04 MED ORDER — ZINC SULFATE 220 (50 ZN) MG PO CAPS
220.0000 mg | ORAL_CAPSULE | Freq: Every day | ORAL | Status: DC
  Administered 2023-01-05 – 2023-01-08 (×4): 220 mg
  Filled 2023-01-04 (×4): qty 1

## 2023-01-04 MED ORDER — VITAMIN C 500 MG PO TABS
500.0000 mg | ORAL_TABLET | Freq: Two times a day (BID) | ORAL | Status: DC
  Administered 2023-01-04 – 2023-01-08 (×8): 500 mg
  Filled 2023-01-04 (×8): qty 1

## 2023-01-04 MED ORDER — PROPRANOLOL HCL 20 MG/5ML PO SOLN
20.0000 mg | Freq: Every day | ORAL | Status: DC
  Administered 2023-01-04 – 2023-01-08 (×4): 20 mg
  Filled 2023-01-04 (×5): qty 5

## 2023-01-04 MED ORDER — ACETAMINOPHEN 325 MG PO TABS
650.0000 mg | ORAL_TABLET | Freq: Four times a day (QID) | ORAL | Status: DC | PRN
  Administered 2023-01-04: 650 mg via ORAL

## 2023-01-04 NOTE — Progress Notes (Signed)
CRRT stopped and blood returned without issue.  Cameron Ali, RN

## 2023-01-04 NOTE — Consult Note (Addendum)
Consultation Note Date: 01/04/2023   Patient Name: Brett Blake  DOB: 28-Jul-1941  MRN: [id number]  Age / Sex: 82 y.o., male  PCP: [name], MD Referring Physician: Flora Lipps, MD  Reason for Consultation: Establishing goals of care  HPI/Patient Profile: This is an 82 yo male with hx of ETOH Abuse who presented to [facility] on 12/28 via EMS from home following a possible seizure activity the morning of 12/28. He has a hx of non epileptic seizures, however he does not take antiepileptics. He reported he has not had a seizure in a long time.   Clinical Assessment and Goals of Care: Notes and labs reviewed. Currently resting in bed on ventilator. Dialysis in progress.   Called to speak with wife. She states she is happy dialysis is in progress. She is able to articulate status and updates. She discusses that he drank alcohol Monday and Tuesday, but began vomiting with oral intake, so had nothing to eat or drink Wednesday, and became SOB with tremors and came to the hospital on Thursday. She states for 35 years patient had stopped drinking, but resumed during covid pandemic to help with sleep. She states consumption increased due to stress. She states prior to admission, he drinks around 8-12oz of bourbon.    She states over the past year he has lost around 20 pounds. She states he does not eat as much as he used to. Functionally, she states over the past 4 months he has declined. Wife tells me he sits around the house, and sleeps a lot. She states he had a back procedure around 2 months ago for back pain, but this has not increased his activity or his spirits.   Discussed time for outcomes and she is waiting to see how he does. Discussed the need for [place] conversation if/when he is able to participate in conversation. Patient has DNR status. Discussed that PMT will follow.       SUMMARY OF  RECOMMENDATIONS    PMT will follow.      Primary Diagnoses: Present on Admission:  Essential hypertension  COPD (chronic obstructive pulmonary disease) (HCC)  Hypokalemia  Hypomagnesemia  HLD (hyperlipidemia)  Dementia (HCC)  Thrombocytopenia (HCC)  Alcohol withdrawal (HCC)  Abnormal LFTs   I have reviewed the medical record, interviewed the patient and family, and examined the patient. The following aspects are pertinent.  Past Medical History:  Diagnosis Date   Anxiety    Arthritis    Asthma    Colon polyps    COPD (chronic obstructive pulmonary disease) ([place])    Depression    Diabetes ([place])    Difficult intubation    GERD (gastroesophageal reflux disease)    High cholesterol    Hyperlipidemia    Hypertension    Hyponatremia 02/23/2022   Sleep apnea    Social History   Socioeconomic History   Marital status: Married    Spouse name: Not on file   Number of children: Not on file   Years of education:  Not on file   Highest education level: Not on file  Occupational History   Not on file  Tobacco Use   Smoking status: Former    Packs/day: 1.00    Years: 15.00    Total pack years: 15.00    Types: Cigarettes   Smokeless tobacco: Never   Tobacco comments:    quit 1977  Vaping Use   Vaping Use: Not on file  Substance and Sexual Activity   Alcohol use: Yes    Alcohol/week: 1.0 - 2.0 standard drink of alcohol    Types: 1 - 2 Shots of liquor per week   Drug use: No   Sexual activity: Not Currently  Other Topics Concern   Not on file  Social History Narrative   Not on file   Social Determinants of Health   Financial Resource Strain: Not on file  Food Insecurity: No Food Insecurity (01/01/2023)   Hunger Vital Sign    Worried About Running Out of Food in the Last Year: Never true    Ran Out of Food in the Last Year: Never true  Transportation Needs: No Transportation Needs (01/01/2023)   PRAPARE - Hydrologist (Medical): No     Lack of Transportation (Non-Medical): No  Physical Activity: Not on file  Stress: Not on file  Social Connections: Not on file   Family History  Problem Relation Age of Onset   Heart attack Mother    Prostate cancer Father    Leukemia Brother    Prostate cancer Brother    Scheduled Meds:  Chlorhexidine Gluconate Cloth  6 each Topical Daily   docusate  100 mg Per Tube BID   fentaNYL (SUBLIMAZE) injection  100 mcg Intravenous Once   folic acid  1 mg Per Tube Daily   free water  30 mL Per Tube Q4H   heparin injection (subcutaneous)  5,000 Units Subcutaneous Q8H   insulin aspart  0-20 Units Subcutaneous Q4H   insulin aspart  8 Units Subcutaneous Q4H   liver oil-zinc oxide   Topical QID   midazolam  2 mg Intravenous Once   multivitamin  1 tablet Per Tube QHS   nutrition supplement (JUVEN)  1 packet Per Tube BID BM   mouth rinse  15 mL Mouth Rinse Q2H   pantoprazole (PROTONIX) IV  40 mg Intravenous QHS   polyethylene glycol  17 g Per Tube Daily   thiamine  100 mg Per Tube Daily   vecuronium  10 mg Intravenous Once   venlafaxine  18.75 mg Per Tube BID WC   vitamin B-12  100 mcg Per Tube Daily   Continuous Infusions:   prismasol BGK 4/2.5 400 mL/hr at 01/03/23 1924    prismasol BGK 4/2.5 400 mL/hr at 01/03/23 1924   sodium chloride     anticoagulant sodium citrate     feeding supplement (VITAL AF 1.2 CAL) 70 mL/hr at 01/04/23 0800   prismasol BGK 4/2.5 1,500 mL/hr at 01/03/23 1924   PRN Meds:.acetaminophen, albuterol, alteplase, anticoagulant sodium citrate, fentaNYL (SUBLIMAZE) injection, heparin, heparin, heparin sodium (porcine), hydrALAZINE, lidocaine (PF), lidocaine-prilocaine, mouth rinse, pentafluoroprop-tetrafluoroeth Medications Prior to Admission:  Prior to Admission medications   Medication Sig Start Date End Date Taking? Authorizing Provider  albuterol (ACCUNEB) 1.25 MG/3ML nebulizer solution Take 3 mLs by nebulization every 6 (six) hours as needed for wheezing.  11/20/22  Yes Wieting, Richard, MD  ALPRAZolam Duanne Moron) 0.5 MG tablet Take 0.5 mg by mouth daily as needed for  anxiety. 10/06/22 10/06/23 Yes [provider]  aspirin EC 81 MG tablet Take 81 mg by mouth daily. Swallow whole.   Yes [provider]  diltiazem (CARDIZEM CD) 240 MG 24 hr capsule Take 240 mg by mouth every morning. 12/07/21  Yes [provider]  donepezil (ARICEPT) 5 MG tablet Take 5 mg by mouth at bedtime. 05/10/22  Yes [provider]  fluticasone (FLONASE) 50 MCG/ACT nasal spray Place 1 spray into both nostrils daily as needed for allergies or rhinitis.   Yes [provider]  folic acid (FOLVITE) 1 MG tablet Take 1 tablet (1 mg total) by mouth daily. 03/01/22  Yes Wieting, Richard, MD  gabapentin (NEURONTIN) 300 MG capsule Take 1 capsule (300 mg total) by mouth at bedtime. 02/28/22  Yes Wieting, Richard, MD  melatonin 5 MG TABS Take 1 tablet (5 mg total) by mouth at bedtime. 02/28/22  Yes Wieting, Richard, MD  nitroGLYCERIN (NITROSTAT) 0.4 MG SL tablet Place 0.4 mg under the tongue every 5 (five) minutes as needed for chest pain.   Yes [provider]  omeprazole (PRILOSEC) 40 MG capsule Take 40 mg by mouth daily. 04/20/22  Yes [provider]  ondansetron (ZOFRAN-ODT) 4 MG disintegrating tablet Take 1 tablet (4 mg total) by mouth every 8 (eight) hours as needed for nausea or vomiting. 06/11/22  Yes Carrie Mew, MD  propranolol (INDERAL) 40 MG tablet Take 40 mg by mouth 2 (two) times daily. 11/07/22  Yes [provider]  QUEtiapine (SEROQUEL) 25 MG tablet One tab po qhs (can take extra dose if not sleeping) 02/28/22  Yes Wieting, Richard, MD  rosuvastatin (CRESTOR) 5 MG tablet Take 5 mg by mouth daily. 05/10/22  Yes [provider]  SYMBICORT 80-4.5 MCG/ACT inhaler Inhale 2 puffs into the lungs 2 (two) times daily as needed.  05/19/20  Yes [provider]  venlafaxine XR (EFFEXOR-XR) 37.5 MG 24 hr capsule  Take 37.5 mg by mouth daily. 12/07/21  Yes [provider]  azithromycin (ZITHROMAX) 250 MG tablet One tab po daily for three days Patient not taking: Reported on 12/07/2022 11/21/22   Loletha Grayer, MD  guaiFENesin (ROBITUSSIN) 100 MG/5ML liquid Take 5 mLs by mouth every 4 (four) hours as needed for cough or to loosen phlegm. Patient not taking: Reported on 12/14/2022 02/28/22   Loletha Grayer, MD  polyethylene glycol (MIRALAX / GLYCOLAX) 17 g packet Take 17 g by mouth daily as needed for moderate constipation. Patient not taking: Reported on 11/08/2022 02/28/22   Loletha Grayer, MD   Allergies  Allergen Reactions   Ace Inhibitors Cough   Metoprolol Diarrhea    Patient has been tolerating Propranolol 40 mg twice daily since at least 05/2022.   Nsaids Rash   Sulfa Antibiotics Rash   Review of Systems  Unable to perform ROS   Physical Exam Constitutional:      Comments: Eyes closed.   Pulmonary:     Comments: On ventilator.     Vital Signs: BP 123/69 (BP Location: Left Leg)   Pulse (!) 109   Temp 99.2 F (37.3 C) (Oral)   Resp (!) 28   Ht '5\' 4"'$  (1.626 m)   Wt 81 kg   SpO2 93%   BMI 30.65 kg/m  Pain Scale: CPOT   Pain Score: 0-No pain   SpO2: SpO2: 93 % O2 Device:SpO2: 93 % O2 Flow Rate: .   IO: Intake/output summary:  Intake/Output Summary (Last 24 hours) at 01/04/2023 1139 Last data  filed at 01/04/2023 1104 Gross per 24 hour  Intake 675.9 ml  Output 1296 ml  Net -620.1 ml    LBM: Last BM Date : 01/02/23 Baseline Weight: Weight: 78 kg Most recent weight: Weight: 81 kg       Signed by: Asencion Gowda, NP   Please contact Palliative Medicine Team phone at [phone] for questions and concerns.  For individual provider: See Shea Evans

## 2023-01-04 NOTE — Progress Notes (Signed)
Progress note - Neurology    S:// Patient seen and examined.   LP done yesterday.  Preliminary review not concerning for infectious or inflammatory process. Got CRRT yesterday.  Getting 2 hours of HD today.  Plan for additional 2 to 2-1/2 hours tomorrow of HD.  Medications  Current Facility-Administered Medications:     prismasol BGK 4/2.5 infusion, , CRRT, Continuous, Lateef, Munsoor, MD, Last Rate: 400 mL/hr at 01/03/23 1924, New Bag at 01/03/23 1924    prismasol BGK 4/2.5 infusion, , CRRT, Continuous, Lateef, Munsoor, MD, Last Rate: 400 mL/hr at 01/03/23 1924, New Bag at 01/03/23 1924   0.9 %  sodium chloride infusion, 250 mL, Intravenous, Continuous, Teressa Lower, NP   acetaminophen (TYLENOL) tablet 650 mg, 650 mg, Oral, Q6H PRN, Flora Lipps, MD, 650 mg at 01/04/23 0801   albuterol (PROVENTIL) (2.5 MG/3ML) 0.083% nebulizer solution 2.5 mg, 2.5 mg, Nebulization, Q4H PRN, Aubery Lapping E, RPH   alteplase (CATHFLO ACTIVASE) injection 2 mg, 2 mg, Intracatheter, Once PRN, Gwyneth Revels, Shantelle, NP   anticoagulant sodium citrate solution 5 mL, 5 mL, Intracatheter, PRN, Colon Flattery, NP   Chlorhexidine Gluconate Cloth 2 % PADS 6 each, 6 each, Topical, Daily, Ouma, Bing Neighbors, NP, 6 each at 01/03/23 1700   docusate (COLACE) 50 MG/5ML liquid 100 mg, 100 mg, Per Tube, BID, Teressa Lower, NP, 100 mg at 01/03/23 2103   feeding supplement (VITAL AF 1.2 CAL) liquid 1,000 mL, 1,000 mL, Per Tube, Continuous, Kasa, Kurian, MD, Last Rate: 70 mL/hr at 01/04/23 0800, Infusion Verify at 01/04/23 0800   fentaNYL (SUBLIMAZE) injection 100 mcg, 100 mcg, Intravenous, Once, Darel Hong D, NP   fentaNYL (SUBLIMAZE) injection 25 mcg, 25 mcg, Intravenous, Q1H PRN, Tonye Royalty, NP, 25 mcg at 78/93/81 0175   folic acid (FOLVITE) tablet 1 mg, 1 mg, Per Tube, Daily, Wynelle Cleveland, RPH, 1 mg at 01/02/23 0959   free water 30 mL, 30 mL, Per Tube, Q4H, Aleskerov, Fuad, MD, 30 mL at 01/04/23 0800    heparin injection 1,000 Units, 1,000 Units, Intracatheter, PRN, Breeze, Benancio Deeds, NP   heparin injection 1,000-6,000 Units, 1,000-6,000 Units, CRRT, PRN, Lateef, Munsoor, MD, 2,800 Units at 01/03/23 2354   heparin injection 5,000 Units, 5,000 Units, Subcutaneous, Q8H, Aleskerov, Fuad, MD, 5,000 Units at 01/04/23 0518   heparin sodium (porcine) injection 2,800 Units, 2,800 Units, Intravenous, BID PRN, Rust-Chester, Toribio Harbour L, NP   hydrALAZINE (APRESOLINE) injection 10-20 mg, 10-20 mg, Intravenous, Q6H PRN, Teressa Lower, NP, 20 mg at 01/03/23 0326   insulin aspart (novoLOG) injection 0-20 Units, 0-20 Units, Subcutaneous, Q4H, Rust-Chester, Britton L, NP, 4 Units at 01/04/23 0759   insulin aspart (novoLOG) injection 8 Units, 8 Units, Subcutaneous, Q4H, Rust-Chester, Britton L, NP, 8 Units at 01/04/23 0759   lidocaine (PF) (XYLOCAINE) 1 % injection 5 mL, 5 mL, Intradermal, PRN, Breeze, Shantelle, NP   lidocaine-prilocaine (EMLA) cream 1 Application, 1 Application, Topical, PRN, Breeze, Shantelle, NP   liver oil-zinc oxide (DESITIN) 40 % ointment, , Topical, QID, Flora Lipps, MD, Given at 01/04/23 0018   midazolam (VERSED) injection 2 mg, 2 mg, Intravenous, Once, Darel Hong D, NP   multivitamin (RENA-VIT) tablet 1 tablet, 1 tablet, Per Tube, QHS, Flora Lipps, MD, 1 tablet at 01/03/23 2104   nutrition supplement (JUVEN) (JUVEN) powder packet 1 packet, 1 packet, Per Tube, BID BM, Flora Lipps, MD   Oral care mouth rinse, 15 mL, Mouth Rinse, Q2H, Aleskerov, Fuad, MD, 15 mL at 01/04/23 0800  Oral care mouth rinse, 15 mL, Mouth Rinse, PRN, Ottie Glazier, MD   pantoprazole (PROTONIX) injection 40 mg, 40 mg, Intravenous, QHS, Dgayli, Khabib, MD, 40 mg at 01/03/23 2104   pentafluoroprop-tetrafluoroeth (GEBAUERS) aerosol 1 Application, 1 Application, Topical, PRN, Colon Flattery, NP   polyethylene glycol (MIRALAX / GLYCOLAX) packet 17 g, 17 g, Per Tube, Daily, Teressa Lower, NP, 17 g at 01/02/23  6295   prismasol BGK 4/2.5 infusion, , CRRT, Continuous, Lateef, Munsoor, MD, Last Rate: 1,500 mL/hr at 01/03/23 1924, New Bag at 01/03/23 1924   thiamine (VITAMIN B1) tablet 100 mg, 100 mg, Per Tube, Daily, Benita Gutter, RPH   vecuronium (NORCURON) injection 10 mg, 10 mg, Intravenous, Once, Darel Hong D, NP   venlafaxine [facility]) tablet 18.75 mg, 18.75 mg, Per Tube, BID WC, Coulter, Carolyn, RPH, 18.75 mg at 01/04/23 0800   vitamin B-12 (CYANOCOBALAMIN) tablet 100 mcg, 100 mcg, Per Tube, Daily, Dgayli, Berdine Addison, MD, 100 mcg at 01/02/23 1000   Exam: Current vital signs: BP 118/68 (BP Location: Left Leg)   Pulse (!) 112   Temp (!) 100.6 F (38.1 C) (Oral)   Resp (!) 30   Ht '5\' 4"'$  (1.626 m)   Wt 81 kg   SpO2 95%   BMI 30.65 kg/m  Vital signs in last 24 hours: Temp:  [97.6 F (36.4 C)-100.6 F (38.1 C)] 100.6 F (38.1 C) (01/10 0833) Pulse Rate:  [48-128] 112 (01/10 0900) Resp:  [13-35] 30 (01/10 0900) BP: (90-163)/(57-85) 118/68 (01/10 0900) SpO2:  [91 %-100 %] 95 % (01/10 0900) FiO2 (%):  [35 %] 35 % (01/10 0800) Weight:  [81 kg] 81 kg (01/10 0300) General: Intubated, no sedation, getting 8 3. HEENT: Normocephalic, atraumatic CVs: Regular rhythm Abdomen nondistended nontender Respiratory: Vented Extremities warm well-perfused Neurological exam Intubated, no sedation No spontaneous movement To very noxious stimulation, sternal rub, did not open eyes. Does not follow commands Nonverbal Cranial nerves: Pupils are equal sluggishly reactive to light, extraocular movements difficult to assess but has oculocephalic reflexes as well as gaze appears to be midline, facial symmetry difficult to ascertain due to the endotracheal tube. Motor examination with no spontaneous movement.  Subtle eye-opening to noxious stimulation-no grimace or flicker movement in the lower extremities today as I had seen yesterday. Sensory examination: As above  Labs I have reviewed labs in epic  and the results pertinent to this consultation are:  CBC    Component Value Date/Time   WBC 11.4 (H) 01/04/2023 0345   RBC 2.56 (L) 01/04/2023 0345   HGB 8.2 (L) 01/04/2023 0345   HCT 26.2 (L) 01/04/2023 0345   PLT 387 01/04/2023 0345   MCV 102.3 (H) 01/04/2023 0345   MCH 32.0 01/04/2023 0345   MCHC 31.3 01/04/2023 0345   RDW 19.7 (H) 01/04/2023 0345   LYMPHSABS 0.5 (L) 01/02/2023 0442   MONOABS 0.7 01/02/2023 0442   EOSABS 0.1 01/02/2023 0442   BASOSABS 0.1 01/02/2023 0442   CMP     Component Value Date/Time   NA 146 (H) 01/04/2023 0345   K 4.1 01/04/2023 0345   CL 108 01/04/2023 0345   CO2 28 01/04/2023 0345   GLUCOSE 182 (H) 01/04/2023 0345   BUN 91 (H) 01/04/2023 0345   CREATININE 1.63 (H) 01/04/2023 0345   CALCIUM 8.5 (L) 01/04/2023 0345   PROT 6.7 01/01/2023 0600   ALBUMIN 1.8 (L) 01/04/2023 0345   AST 52 (H) 01/01/2023 0600   ALT 33 01/01/2023 0600   ALKPHOS 106 01/01/2023  0600   BILITOT 1.0 01/01/2023 0600   GFRNONAA 42 (L) 01/04/2023 0345   GFRAA 52 (L) 07/16/2020 1408  TSH 3.7 B12 1 year ago was 279.  CSF analysis: Meningitis encephalitis panel negative. Glucose 123, protein 47, RBC 7 and 10, WBC 5 and 2.  Imaging I have reviewed the images obtained: MRI brain done on December 28, 2022 with no acute or reversible finding for altered mental status or seizure.  Chronic calcification in posterior lateral left thalamus, age-related generalized atrophy.  Neurodiagnostics EEG 12/30/2022 with severe diffuse slowing while on propofol.  No seizures seen. EEG on 12/23/2022 with generalized burst attenuation suggestive of severe to profound diffuse encephalopathy but likely related to sedation.  No seizures seen.  Assessment: 82 year old man past history of alcohol abuse COPD hypertension diabetes coronary artery disease history of nonepileptic spells memory changes presenting for complaints of seizures all over, initially reporting drinking 1 drink a night but turns  out had been abusing alcohol for the past few days with sudden discontinuation 2 days prior to presentation.  Admitted to the ICU for delirium tremens.  Also noted to have AKI which is being corrected.  Mild hyperammonemia also noted which is being treated by the primary team. Has been off of sedation for the past few days and is still very unresponsive for which neurological consultation was obtained. MRI with no evidence of stroke or reversible causes of altered mental status or seizures. EEG with profound background suppression due to sedation-no seizures.  Clinical presentation likely consistent with toxic metabolic encephalopathy due to multiple metabolic derangements and likely alcohol withdrawal/delirium tremens making this more protracted than to be expected.  His underlying renal dysfunction and other metabolic derangements may also be contributing to the prolonged encephalopathy.  Suspicion for underlying CNS infection or inflammatory process was low but an LP was ordered-CSF findings do not support infectious or inflammatory process. Repeat EEG with no evidence of seizures  Recommendations: Management of toxic metabolic derangements per primary team as you are.  Few sessions of dialysis may help. CSF not concerning for infection/inflammatory process. I suspect that his presentation is more consistent with alcohol related delirium tremens and metabolic encephalopathy rather than an infectious or CNS inflammatory cause and because of his prior alcohol abuse, he may take time to come around even after correction of his metabolic derangements and his lab tests that have now started to trend in a positive direction. Historically has had low B12-continue B12 and thiamine replenishment.  I will continue to follow with you. Plan was discussed with the CCM team-Dr. Mortimer Fries  -- Amie Portland, MD Neurologist [facility] Pager: [phone]  CRITICAL CARE ATTESTATION Performed by:  Amie Portland, MD Total critical care time: 30 minutes Critical care time was exclusive of separately billable procedures and treating other patients and/or supervising APPs/Residents/Students Critical care was necessary to treat or prevent imminent or life-threatening deterioration. This patient is critically ill and at significant risk for neurological worsening and/or death and care requires constant monitoring. Critical care was time spent personally by me on the following activities: development of treatment plan with patient and/or surrogate as well as nursing, discussions with consultants, evaluation of patient's response to treatment, examination of patient, obtaining history from patient or surrogate, ordering and performing treatments and interventions, ordering and review of laboratory studies, ordering and review of radiographic studies, pulse oximetry, re-evaluation of patient's condition, participation in multidisciplinary rounds and medical decision making of high complexity in the care of this patient.

## 2023-01-04 NOTE — Progress Notes (Signed)
[facility]  PROGRESS NOTE   Subjective:   Patient remains in on ICU with vent support, 35% FiO2 No sedation, not responsive to name No pressors, stable blood pressure    Objective:  Vital signs: Blood pressure 136/73, pulse (!) 114, temperature (!) 100.6 F (38.1 C), temperature source Oral, resp. rate (!) 26, height '5\' 4"'$  (1.626 m), weight 81 kg, SpO2 95 %.  Intake/Output Summary (Last 24 hours) at 01/04/2023 1039 Last data filed at 01/04/2023 0800 Gross per 24 hour  Intake 675.9 ml  Output 1296 ml  Net -620.1 ml    Filed Weights   01/02/23 0500 01/03/23 0423 01/04/23 0300  Weight: 83.4 kg 83.1 kg 81 kg     Physical Exam: General: Critically ill-appearing  Head:  Normocephalic, atraumatic. Moist oral mucosal membranes  Eyes:  Anicteric  Lungs:   Clear to auscultation, normal effort  Heart:  S1S2 no rubs  Abdomen:   Soft, nontender, bowel sounds present  Extremities:  Trace peripheral edema.  Neurologic:  Poorly responsive  Skin:  No lesions  Access: Left IJ temporary hemodialysis catheter    Basic Metabolic Panel: Recent Labs  Lab 01/01/23 0600 01/01/23 1756 01/02/23 0442 01/03/23 0507 01/03/23 1659 01/04/23 0345  NA 143 141 143 145 147* 146*  K 4.7 4.1  4.1 4.1 4.2 4.0 4.1  CL 105 102 102 106 106 108  CO2 '26 26 28 26 29 28  '$ GLUCOSE 218* 274* 228* 210* 132* 182*  BUN 92* 98* 98* 96* 98* 91*  CREATININE 1.92* 1.85* 1.93* 1.92* 2.02* 1.63*  CALCIUM 8.6* 8.7* 8.8* 8.9 9.0 8.5*  MG 2.3 2.4 2.4 2.4  --  2.2  PHOS  --  3.8 3.4 3.2 4.5 2.6    GFR: Estimated Creatinine Clearance: 34.7 mL/min (A) (by C-G formula based on SCr of 1.63 mg/dL (H)).  Liver Function Tests: Recent Labs  Lab 01/01/23 0600 01/03/23 0507 01/03/23 1659 01/04/23 0345  AST 52*  --   --   --   ALT 33  --   --   --   ALKPHOS 106  --   --   --   BILITOT 1.0  --   --   --   PROT 6.7  --   --   --   ALBUMIN 1.8* 1.9* 1.7* 1.8*    No results for input(s): "LIPASE",  "AMYLASE" in the last 168 hours. Recent Labs  Lab 12/29/22 1113 01/01/23 1207  AMMONIA 44* 57*     CBC: Recent Labs  Lab 12/31/22 0637 01/01/23 0600 01/02/23 0442 01/03/23 0507 01/04/23 0345  WBC 13.5* 11.7* 11.3* 13.4* 11.4*  NEUTROABS  --   --  9.6*  --   --   HGB 10.1* 9.5* 9.5* 9.3* 8.2*  HCT 30.9* 29.4* 29.9* 28.9* 26.2*  MCV 99.4 99.7 101.0* 100.7* 102.3*  PLT 290 305 336 389 387      HbA1C: Hgb A1c MFr Bld  Date/Time Value Ref Range Status  11/20/2022 05:04 AM 7.4 (H) 4.8 - 5.6 % Final    Comment:    (NOTE)         Prediabetes: 5.7 - 6.4         Diabetes: >6.4         Glycemic control for adults with diabetes: <7.0   02/15/2022 05:57 AM 5.9 (H) 4.8 - 5.6 % Final    Comment:    (NOTE)         Prediabetes: 5.7 - 6.4  Diabetes: >6.4         Glycemic control for adults with diabetes: <7.0     Urinalysis: No results for input(s): "COLORURINE", "LABSPEC", "PHURINE", "GLUCOSEU", "HGBUR", "BILIRUBINUR", "KETONESUR", "PROTEINUR", "UROBILINOGEN", "NITRITE", "LEUKOCYTESUR" in the last 72 hours.  Invalid input(s): "APPERANCEUR"    Imaging: DG Chest Port 1 View  Result Date: 01/04/2023 CLINICAL DATA:  Follow-up right pneumothorax and right chest tube. EXAM: PORTABLE CHEST 1 VIEW COMPARISON:  01/03/2023 and older studies. FINDINGS: Right inferior hemithorax chest tube is stable.  No pneumothorax. Opacities in the perihilar and medial lung bases suspected to be atelectasis. No pulmonary edema. Stable well-positioned endotracheal tube, left internal jugular central venous line and nasal/orogastric tube. IMPRESSION: 1. No significant change from the most recent prior exam. 2. Stable inferior right hemithorax chest tube.  No pneumothorax. 3. Perihilar medial lung base opacities suspect to be atelectasis. Pneumonia possible. No pulmonary edema. Electronically Signed   By: Lajean Manes M.D.   On: 01/04/2023 10:20   IR FL GUIDED LOC OF NEEDLE/CATH TIP FOR SPINAL  INJECT RT  Result Date: 01/03/2023 CLINICAL DATA:  Patient with altered mental status, decreased level of consciousness. Request received for diagnostic lumbar puncture. EXAM: DIAGNOSTIC LUMBAR PUNCTURE UNDER FLUOROSCOPIC GUIDANCE COMPARISON:  None Available. FLUOROSCOPY: Radiation Exposure Index (as provided by the fluoroscopic device): 4.20 mGy Kerma PROCEDURE: This procedure was performed by Reatha Armour, PA-C and supervised and interpreted by Dr Michaelle Birks Informed consent was obtained from the the patient and/or patient's representative prior to the procedure, including potential complications of headache, allergy, and pain. With the patient prone, the lower back was prepped with Betadine. 1% Lidocaine was used for local anesthesia. Lumbar puncture was performed at the L3-L4 level using a 22 gauge needle with return of clear CSF with an opening pressure of 25 cm water. Closing pressure of 15 cm water. 10 ml of CSF were obtained for laboratory studies. The patient tolerated the procedure well and there were no apparent complications. IMPRESSION: 1.  Successful, uncomplicated lumbar puncture at L3-L4 level. 10 mL of clear CSF submitted for analysis. 2. Documented opening pressure of 25 cm H20, and closing pressure of 15 cm H20 Electronically Signed   By: Michaelle Birks M.D.   On: 01/03/2023 16:20   DG Chest Port 1 View  Result Date: 01/03/2023 CLINICAL DATA:  Right pneumothorax, chest tube placement EXAM: PORTABLE CHEST 1 VIEW COMPARISON:  Previous studies including the examination done earlier today FINDINGS: There is interval placement of right chest tube with its tip in right lower lung field. There is resolution of right pneumothorax. Subcutaneous emphysema is seen in right chest wall. Tip of endotracheal tube is 4.5 cm above the carina. Enteric tube is noted traversing the esophagus. Tip of left IJ central venous catheter is seen in superior vena cava. Increased interstitial markings are seen in both  lungs suggesting scarring or interstitial pneumonia. No new focal infiltrates are seen. There is no significant pleural effusion. IMPRESSION: Interval clearing of right pneumothorax after placement of right chest tube. No new focal infiltrates are seen. Electronically Signed   By: Elmer Picker M.D.   On: 01/03/2023 13:09   DG Chest Port 1 View  Addendum Date: 01/03/2023   ADDENDUM REPORT: 01/03/2023 12:19 ADDENDUM: Critical Value/emergent results were called by telephone at the time of interpretation on 01/03/2023 at 12:19 pm to provider [facility] , who verbally acknowledged these results. Electronically Signed   By: Marijo Conception M.D.   On: 01/03/2023 12:19  Result Date: 01/03/2023 CLINICAL DATA:  Status post intubation. EXAM: PORTABLE CHEST 1 VIEW COMPARISON:  January 02, 2023. FINDINGS: There is interval development of large right sided pneumothorax. Endotracheal and nasogastric tubes appear to be in good position. Left internal jugular catheter is unchanged. Minimal left basilar subsegmental atelectasis is noted. Bony thorax is unremarkable. IMPRESSION: Interval development of large right-sided pneumothorax. Electronically Signed: By: Marijo Conception M.D. On: 01/03/2023 12:11   DG Abd 1 View  Result Date: 01/03/2023 CLINICAL DATA:  82 year old male status post intubation and oral enteric tube placement. EXAM: ABDOMEN - 1 VIEW COMPARISON:  Portable abdomen 01/01/2023. FINDINGS: Portable AP semi upright view at 1159 hours. Enteric tube terminates in the stomach near midline. Side hole at the level of the gastric body. Stable cholecystectomy clips. Mid lumbar vertebral augmentation again noted. Nonobstructed visible bowel gas pattern. At least moderate sized right pneumothorax is visible, see portable chest x-ray reported separately. IMPRESSION: 1. At least moderate right side pneumothorax is visible, see portable chest x-ray from the same time reported separately. 2. Satisfactory enteric tube  placement into the stomach. Electronically Signed   By: Genevie Ann M.D.   On: 01/03/2023 12:14   DG Chest Port 1 View  Result Date: 01/02/2023 CLINICAL DATA:  Central line placement EXAM: PORTABLE CHEST 1 VIEW COMPARISON:  Chest x-ray 01/01/2023 FINDINGS: Endotracheal tube tip is 3.7 cm above the carina. Left-sided central venous catheter tip projects over the brachiocephalic SVC junction. Enteric tube extends below the diaphragm into the stomach. Stiff the cardiomediastinal silhouette is stable, the heart is enlarged. There central pulmonary vascular congestion. There is a small left pleural effusion, new from prior. No pneumothorax or acute fracture. IMPRESSION: 1. Left-sided central venous catheter tip projects over the brachiocephalic SVC junction. 2. Cardiomegaly with central pulmonary vascular congestion and small left pleural effusion. Electronically Signed   By: Ronney Asters M.D.   On: 01/02/2023 19:49   EEG adult  Result Date: 01/02/2023 Lora Havens, MD     01/02/2023  2:27 PM Patient Name: Brett Blake MRN: [id number] Epilepsy Attending: Lora Havens Referring Physician/Provider: Teressa Lower, NP Date: 01/02/2023 Duration: 33.19 mins Patient history:  82 year old man past history of alcohol abuse COPD hypertension diabetes coronary artery disease history of nonepileptic spells memory changes presenting for complaints of seizures.  EEG to evaluate for seizure. Level of alertness: lethargic AEDs during EEG study: Technical aspects: This EEG study was done with scalp electrodes positioned according to the 10-20 International system of electrode placement. Electrical activity was reviewed with band pass filter of 1-'70Hz'$ , sensitivity of 7 uV/mm, display speed of 66m/sec with a '60Hz'$  notched filter applied as appropriate. EEG data were recorded continuously and digitally stored.  Video monitoring was available and reviewed as appropriate. Description: EEG showed continuous generalized 3 to 6 Hz  theta-delta slowing. Hyperventilation and photic stimulation were not performed.   ABNORMALITY - Continuous slow, generalized IMPRESSION: This study is suggestive of moderate diffuse encephalopathy, nonspecific etiology. No seizures or epileptiform discharges were seen throughout the recording. Priyanka [name]    Medications:     prismasol BGK 4/2.5 400 mL/hr at 01/03/23 1924    prismasol BGK 4/2.5 400 mL/hr at 01/03/23 1924   sodium chloride     anticoagulant sodium citrate     feeding supplement (VITAL AF 1.2 CAL) 70 mL/hr at 01/04/23 0800   prismasol BGK 4/2.5 1,500 mL/hr at 01/03/23 1924    Chlorhexidine Gluconate Cloth  6 each  Topical Daily   docusate  100 mg Per Tube BID   fentaNYL (SUBLIMAZE) injection  100 mcg Intravenous Once   folic acid  1 mg Per Tube Daily   free water  30 mL Per Tube Q4H   heparin injection (subcutaneous)  5,000 Units Subcutaneous Q8H   insulin aspart  0-20 Units Subcutaneous Q4H   insulin aspart  8 Units Subcutaneous Q4H   liver oil-zinc oxide   Topical QID   midazolam  2 mg Intravenous Once   multivitamin  1 tablet Per Tube QHS   nutrition supplement (JUVEN)  1 packet Per Tube BID BM   mouth rinse  15 mL Mouth Rinse Q2H   pantoprazole (PROTONIX) IV  40 mg Intravenous QHS   polyethylene glycol  17 g Per Tube Daily   thiamine  100 mg Per Tube Daily   vecuronium  10 mg Intravenous Once   venlafaxine  18.75 mg Per Tube BID WC   vitamin B-12  100 mcg Per Tube Daily    Assessment/ Plan:     Brett Blake is a 82 y.o.  male with a PMHx of advanced COPD, asthma, diabetes mellitus type 2, GERD, hyperlipidemia, hypertension, obstructive sleep apnea, anxiety, who was admitted to [facility] on 11/28/2022 for evaluation of severe tremors.  Patient has history of alcohol intake nightly.  He developed significant delirium tremens upon admission and subsequently intubated for airway protection.    #1: Acute kidney injury: Patient with acute kidney injury most  likely secondary to acute tubular necrosis due to hemodynamic compromise.  Renal sonogram was negative.  Patient appears to be producing adequate amounts of urine. Unable to receive dialysis yesterday due to unforseen circumstances. Shortened CRRT treatment received overnight. Receiving hemodialysis today. Will monitor mentation with BUN correction.   #2: Acute respiratory failure: Remains intubated with vent support   #3: Diabetes: Glucose well controlled with prescribed insulin.    #4: Sepsis: Remains on Zosyn     LOS: [facility] 1/10/202410:39 AM

## 2023-01-04 NOTE — TOC Initial Note (Signed)
Transition of Care [facility]) - Initial/Assessment Note    Patient Details  Name: Brett Blake MRN: [id number] Date of Birth: 01/11/1941  Transition of [facility] CM/SW Contact:    Shelbie Hutching, RN Phone Number: 01/04/2023, 10:45 AM  Clinical Narrative:                 Patient admitted to the hospital for seizure like activity, history of alcohol abuse.  Patient has been in the Icu on the Ventilator since 12/28, currently in the ICU, patient has been off sedation for multiple days with no responsiveness to stimuli.  Patient is currently getting dialysis.  Neurology is following and palliative is following.    TOC will follow.   Expected Discharge Plan:  (TBD) Barriers to Discharge: Continued Medical Work up   Patient Goals and CMS Choice Patient states their goals for this hospitalization and ongoing recovery are:: patient is unable to state, family wants to see how he does with dialysis          Expected Discharge Plan and Services       Living arrangements for the past 2 months: Single Family Home                 DME Arranged: N/A DME Agency: NA       HH Arranged: NA [place] Agency: NA        Prior Living Arrangements/Services Living arrangements for the past 2 months: [place] Lives with:: Spouse Patient language and need for interpreter reviewed:: Yes          Care giver support system in place?: Yes (comment)   Criminal Activity/Legal Involvement Pertinent to Current Situation/Hospitalization: No - Comment as needed  Activities of Daily Living Home Assistive Devices/Equipment: None ADL Screening (condition at time of admission) Is the patient deaf or have difficulty hearing?: No Does the patient have difficulty seeing, even when wearing glasses/contacts?: No Does the patient have difficulty concentrating, remembering, or making decisions?: No Does the patient have difficulty dressing or bathing?: No Does the patient have difficulty walking or  climbing stairs?: No  Permission Sought/Granted      Share Information with NAME: Brett Blake     Permission granted to share info w Relationship: spouse  Permission granted to share info w Contact Information: [phone]  Emotional Assessment Appearance:: Appears stated age Attitude/Demeanor/Rapport: Unresponsive     Alcohol / Substance Use: Alcohol Use Psych Involvement: No (comment)  Admission diagnosis:  Alcohol withdrawal ([place]) [F10.939] Hypokalemia [E87.6] Hypomagnesemia [E83.42] Seizure ([place]) [R56.9] Acute encephalopathy [G93.40] Seizure-like activity ([place]) [R56.9] Alcohol withdrawal syndrome without complication ([place]) [N82.956] Patient Active Problem List   Diagnosis Date Noted   Pneumothorax 01/03/2023   Acute renal failure ([place]) 01/01/2023   Acute hypoxic respiratory failure ([place]) 12/30/2022   Seizure-like activity ([place]) 12/03/2022   HLD (hyperlipidemia) 12/03/2022   Diabetes mellitus without complication ([place]) 21/30/8657   Dementia ([place]) 12/13/2022   Thrombocytopenia ([place]) 11/27/2022   Alcohol withdrawal ([place]) 12/12/2022   Acute encephalopathy 12/25/2022   COPD exacerbation ([place]) 11/18/2022   Anemia of chronic disease 02/27/2022   Hyponatremia 02/23/2022   Hypomagnesemia 02/21/2022   Hypokalemia 02/21/2022   Delirium tremens ([place]) 02/15/2022   COPD (chronic obstructive pulmonary disease) ([place]) 02/14/2022   GERD (gastroesophageal reflux disease) 02/14/2022   Cough 02/14/2022   Abnormal LFTs 02/14/2022   AMS (altered mental status) 02/14/2022   Unstable angina ([place]) 12/11/2021   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   Sepsis ([place]) 05/20/2021  Acute gastroenteritis 05/20/2021   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   Chest pain 07/16/2020   Depression    Cholecystitis 05/08/2019   Paroxysmal tachycardia ([place]) 02/23/2017   Colon polyps 06/29/2016   Essential hypertension 06/29/2016   Elevated PSA 06/29/2016   Hyperlipidemia, mixed 04/19/2016   Type 2  diabetes mellitus with hyperlipidemia ([place]) 10/19/2015   Insomnia, persistent 09/03/2015   Chronic insomnia 09/03/2015   Sleep apnea 02/13/2015   Chronic bronchitis ([place]) 12/14/2011   PCP:  [name], MD Pharmacy:   [id number] [place], [place] [id number]: [phone]: [phone]    Social Determinants of Health (SDOH) Social History: SDOH Screenings   Food Insecurity: No Food Insecurity (01/01/2023)  Transportation Needs: No Transportation Needs (01/01/2023)  Utilities: Not At Risk (01/01/2023)  Depression (PHQ2-9): Medium Risk (09/14/2020)  Tobacco Use: Medium Risk (01/03/2023)   SDOH Interventions:     Readmission Risk Interventions     No data to display

## 2023-01-04 NOTE — Progress Notes (Signed)
Nutrition Follow-up  DOCUMENTATION CODES:   Obesity unspecified  INTERVENTION:   Continue Vital 1.2 to 25m/hr continuous   Free water flushes 326mq4 hours to maintain tube patency   Regimen provides 2016kcal/day, 126g/day protein and 154264may of free water.   Daily weights  Juven Fruit Punch BID via tube, each serving provides 95kcal and 2.5g of protein (amino acids glutamine and arginine)  Rena-vit daily via tube   Vitamin C '500mg'$  BID via tube   Zinc '220mg'$  daily via tube x 14 days  NUTRITION DIAGNOSIS:   Inadequate oral intake related to inability to eat as evidenced by NPO status.  GOAL:   Provide needs based on ASPEN/SCCM guidelines -met   MONITOR:   Vent status, Labs, Weight trends, Skin, TF tolerance, I & O's  ASSESSMENT:   [age] male with h/o seizures, HTN, COPD, HLD, DM, etoh abuse, PAF, depression, anxiety, dementia, OSA and GERD who is admitted with ETOH withdrawal and possible seizures.  -Pt s/p right chest tube placement 1/9 for pneumothorax  -Pt s/p lumbar puncture 1/9  Pt remains sedated and ventilated. OGT in place. Pt is tolerating tube feeds well at goal rate. Pt is off sedation with poor neuro exam; neurology following. Pt with worsening AKI; pt had one session of CRRT yesterday with plans to continue with a trial of HD to hopefully improve encephalopathy. Per chart, pt is up ~7lbs since admission. Pt +1.7L on his I & Os. Pt with new sacral wound; RD will add vitamins to support wound healing.   Medications reviewed and include: colace, folic acid, heparin, insulin, rena-vit, juven, miralax, protonix, thiamine, B12   Labs reviewed: Na 146(H), K 4.1 wnl, BUN 91(H), creat 1.63(H), P 2.6 wnl, Mg 2.2 wnl Wbc- 11.4(H), Hgb 8.2(L), Hct 26.2(L) Cbgs- 179, 188, 162 x 24 hrs  Patient is currently intubated on ventilator support MV: 9.1 L/min Temp (24hrs), Avg:98.7 F (37.1 C), Min:97.6 F (36.4 C), Max:100.6 F (38.1 C)  Propofol: none   MAP-  >22m74m  UOP- 935ml58miet Order:   Diet Order     None      EDUCATION NEEDS:   Not appropriate for education at this time  Skin:  Skin Assessment: Reviewed RN Assessment (deep tissue pressure injury to sacrum)  Last BM:  1/9- type 7  Height:   Ht Readings from Last 1 Encounters:  12/06/2022 '5\' 4"'$  (1.626 m)    Weight:   Wt Readings from Last 1 Encounters:  01/04/23 81 kg    Ideal Body Weight:  59.1 kg  BMI:  Body mass index is 30.65 kg/m.  Estimated Nutritional Needs:   Kcal:  1900-2200kcal/day  Protein:  > 118 grams  Fluid:  1.5-1.8L/day  CaseyKoleen DistanceRD, LDN Please refer to [facility] and/or RD on-call/weekend/after hours pager

## 2023-01-04 NOTE — Progress Notes (Addendum)
NAME:  Brett Blake, MRN:  [id number], DOB:  11/11/1941, LOS: 68 ADMISSION DATE:  12/23/2022, CONSULTATION DATE: 12/21/2022 REFERRING MD: Dr. Blaine Hamper, CHIEF COMPLAINT: AMS   History of Present Illness:  This is an 82 yo male with hx of ETOH Abuse who presented to [facility] ER on 12/28 via EMS from home following a possible seizure activity the morning of 12/28.  He has a hx of non epileptic seizures, however he does not take antiepileptics.  He reported he has not had a seizure in a long time.  All hx obtained from pts chart and daughter.   ED Course Upon arrival to the ER pt noted to have severe tremors.  Pt reported he drinks 1 small alcoholic beverage every night prior to going to bed.  However, pts wife reported he drank hard liquor heavily over the past several days and abruptly stopped drinking 2 days ago.  He also complained of an upset stomach.  ER lab results revealed K+ 3.1/glucose 138/calcium 8.3/albumin 3.4/AST 81/ALT 62/total protein 6.4/magnesium 1.6/wbc 3.9/hgb 12.1/platelets 125.  COVID-19/Influenza A&B negative.  Pt had a CIWA score of 4, he received 1 mg of iv ativan.  Hospitalist initially contacted for hospital admission.  However, pt became increasingly agitated and combative despite receiving a total of 3 mg of iv ativan and 3 mg of IM haldol.  Security called to bedside.  PCCM team consulted due to pt requiring precedex gtt.  However, despite precedex gtt at maximal dose; additional 2 mg of iv ativan, 12.5 mg of iv fentanyl, and 2.5 mg of iv haldol pt remained combative/agitated requiring mechanical intubation.  Pt admitted to ICU per PCCM team for additional workup and treatment.    CT Head 12/28:  No evidence of acute intracranial abnormality. Mild chronic small vessel ischemic disease. CXR 12/28: No active cardiopulmonary disease.   Please see "significant hospital events" section below for full detailed hospital course.  Pertinent  Medical History  Anxiety  Arthritis   Asthma  Colon Polyps  COPD Depression  Type II Diabetes Mellitus Difficult Intubation  GERD  Hypercholesteremia  HLD HTN  Hyponatremia  OSA   Micro Data:  12/28: SARS-CoV-2 and influenza PCR>> negative 12/28: MRSA PCR>> negative 1/5: Tracheal aspirate>>ENTEROBACTER CLOACAE, STAPHYLOCOCCUS AUREUS  1/6: MRSA PCR>>negative  Antimicrobials:   Anti-infectives (From admission, onward)    Start     Dose/Rate Route Frequency Ordered Stop   12/30/22 2200  piperacillin-tazobactam (ZOSYN) IVPB 3.375 g        3.375 g 12.5 mL/hr over 240 Minutes Intravenous Every 8 hours 12/30/22 1902 01/04/23 0103       Significant Hospital Events: Including procedures, antibiotic start and stop dates in addition to other pertinent events   12/28: Pt admitted with possible seizure activity suspect secondary to severe ETOH withdrawal requiring mechanical intubation  12/28: CT Head>No evidence of acute intracranial abnormality. Mild chronic small vessel ischemic disease.  12/23/22- patient still having seizures despite propofol, the seizures do respond to Ativan.  Pharmacy is monitoring electrolytes.  He is weaning off levophed.  He will start feeds today.  ABG with res alkalosis and RR was reduced.  12/24/22- patient improved, dcd phenobarb and wake up assessment was very poor today with onset of tachyarrythmia in 140s and seizure like activity upon reduction of sedation.  12/25/22-  patient is critically ill, he is not tolerating reduction of sedation with emergence of seizure like activity and tachyarrythmia. Wife and nephew at bedside we discussed short term plan. Today  we have transitioned to herparin from lovenox due to worsening renal impairment.  12/26/22- patient was not able to tolerate reduction in sedation with devt of severe hypertension and tachyarrythmia 12/27/22:  AKI slowly improving.  Start Precedex to assist with WUA. 12/28/22: MR Brain WO Contrast>no acute or reversible finding. Generalized  age  related atrophy. Minimal small vessel change of the cerebral hemispheric white matter, less than often seen at this age. Chronic nonprogressive benign type  calcification within the posterolateral left thalamus. Sinus mucosal thickening and  layering fluid as often seen in patients with mechanical ventilation. Mastoid effusions as often seen in patients with mechanical ventilation.  12/29/22: Pt failed WUA  12/30/22: EEG negative for electrographic seizures, however severe diffuse slowing indicative of global cerebral dysfunction, medication effect, or both  12/30/22: No acute events overnight. Pt remains mechanically intubated FiO2 35%. Will perform WUA and possible SBT  01/01/23: Pt with continued poor neurological exam remains unresponsive despite not receiving sedating medications since 12/30/22.  CT Head and EEG negative.  Remains on minimal mechanical ventilator settings, however neurological exam impeding extubation 01/02/23:   Neuro exam unchanged which is precluding extubation.  Consult Neurology,  IR to evaluate for LP.  Consult Palliative Care for Force.  Code status changed to DNR.  HD catheter placed as family wishes to trial HD to see if would improve mental status. 01/03/23: Slightly more responsive this morning. ETT cuff with severe leak requiring exchange of ETT.  Plan for trial of HD.  Plan for LP with IR 01/04/23: Pt received 4 hrs of CRRT overnight.  First HD session today.  No acute events overnight.  Remains mechanically intubated   Interim History / Subjective:  As above under significant events   Objective   Blood pressure 114/64, pulse (!) 120, temperature 98.9 F (37.2 C), temperature source Axillary, resp. rate (!) 33, height '5\' 4"'$  (1.626 m), weight 81 kg, SpO2 91 %.    Vent Mode: PRVC FiO2 (%):  [35 %-40 %] 35 % Set Rate:  [16 bmp] 16 bmp Vt Set:  [400 mL] 400 mL PEEP:  [5 cmH20] 5 cmH20 Plateau Pressure:  [18 cmH20] 18 cmH20   Intake/Output Summary (Last 24 hours) at  01/04/2023 0724 Last data filed at 01/04/2023 0500 Gross per 24 hour  Intake 465.9 ml  Output 1296 ml  Net -830.1 ml   Filed Weights   01/02/23 0500 01/03/23 0423 01/04/23 0300  Weight: 83.4 kg 83.1 kg 81 kg   Examination: General: Acutely on chronically-ill appearing male, mechanically ventilated.  In NAD HENT: Supple, difficult to assess for JVD due to body habitus, orally intubated Lungs: Coarse breath sounds throughout, no wheezing or rhonchi noted, even, non labored, occasionally overbreathes the vent Cardiovascular: Sinus tachycardia, s1s2, no r/g, 2+ radial/2+ distal, 2+ generalized edema  Abdomen: +BS x4, obese, soft, distended, rectal tube in place with stool present  Extremities: Normal bulk and tone, no deformities Neuro: Off sedation, corneal and gag reflexes present, PERRL, intermittently withdrawing and grimacing from pain, starting to open eyes to vigorous stimulation GU: Indwelling foley catheter draining yellow urine  Resolved Hospital Problem list     Assessment & Plan:   Intubation and mechanical ventilation for airway protection in the setting of ETOH withdrawal  Right pneumothorax post intubation 01/03/23 s/p right-sided chest tube placement with resolution of pneumothorax Hx: OSA, COPD, Asthma, and Difficult Intubation  - Full vent support, implement lung protective strategies - Plateau pressures less than 30 cm H20 - Wean  FiO2 & PEEP as tolerated to maintain O2 sats 88 to 92% - Follow intermittent Chest X-ray & ABG as needed - Spontaneous Breathing Trials when respiratory parameters met and mental status permits - Continue VAP Bundle - Bronchodilators & Pulmicort nebs - Continue right sided chest tube for now: suction @ 20 cm   Hypertension Tachycardia Hx: Hypercholesteremia   Echo 12/31/22: EF 60 to 25%; grade I diastolic dysfunction  - Continuous cardiac monitoring - Prn hydralazine for bp management; propanolol held due to bradycardia   Acute kidney  injury secondary to ATN - Renal US negative - Trend BMP  - Replace electrolytes as indicated  - Monitor UOP  - Avoid nephrotoxic medications - Nephrology following, appreciate input ~ plan for trial of HD to see if helps with encephalopathy  LLL pneumonia (ENTEROBACTER CLOACAE, STAPHYLOCOCCUS AUREUS) Fever - Monitor fever curve - Trend WBC's & Procalcitonin - Follow cultures as above - Completed abx course   Type II diabetes mellitus  - CBG's q4h; Target range of 140 to 180 - SSI and scheduled novolog  - Follow ICU Hypo/Hyperglycemia protocol  Possible seizure activity in the setting of ETOH withdrawal  Delirium Tremens Acute Metabolic Encephalopathy  Sedation needs in setting of mechanical ventilation CT Head on admission: negative for acute intracranial abnormality EEG 12/23/22: suggestive of severe to profound diffuse encephalopathy, nonspecific etiology but likely related to sedation. No seizures or epileptiform discharges were seen throughout the recording.  MRI 1/3: with no acute or reversible finding. Chronic age related changes EEG 1/5: negative for seizure activity  - Treatment of metabolic derangements as outlined above - Maintain a RASS goal of 0  - Currently off sedation - Avoid sedating medications as able - Daily wake up assessment  - Seizure precautions  - CIWA protocol - Continue thiamine, folic acid, and MVI - Continue lactulose: goal 1L of stool daily  - LP performed 01/09 meningitis/encephalitis panel negative  - Neurology following, appreciate input - Trial of HD to help alleviate azotemia to determine if this will help neurological exam   Pt is critically ill.  Prognosis is guarded and overall long term prognosis is poor.  Pt is DNR status.  Palliative Care consulted to assist with [place]. Best Practice (right click and "Reselect all SmartList Selections" daily)   Diet/type: NPO, tube feeds DVT prophylaxis: Heparin SQ GI prophylaxis: H2B Lines: Left IJ  Trialysis, and is still needed Foley:  Yes, and it is still needed Code Status:  DNR Last date of multidisciplinary goals of care discussion [01/03/23]  01/04/23: Will update pts family when they arrive at bedside  Labs   CBC: Recent Labs  Lab 12/31/22 0637 01/01/23 0600 01/02/23 0442 01/03/23 0507 01/04/23 0345  WBC 13.5* 11.7* 11.3* 13.4* 11.4*  NEUTROABS  --   --  9.6*  --   --   HGB 10.1* 9.5* 9.5* 9.3* 8.2*  HCT 30.9* 29.4* 29.9* 28.9* 26.2*  MCV 99.4 99.7 101.0* 100.7* 102.3*  PLT 290 305 336 389 053    Basic Metabolic Panel: Recent Labs  Lab 01/01/23 0600 01/01/23 1756 01/02/23 0442 01/03/23 0507 01/03/23 1659 01/04/23 0345  NA 143 141 143 145 147* 146*  K 4.7 4.1  4.1 4.1 4.2 4.0 4.1  CL 105 102 102 106 106 108  CO2 '26 26 28 26 29 28  '$ GLUCOSE 218* 274* 228* 210* 132* 182*  BUN 92* 98* 98* 96* 98* 91*  CREATININE 1.92* 1.85* 1.93* 1.92* 2.02* 1.63*  CALCIUM 8.6* 8.7* 8.8*  8.9 9.0 8.5*  MG 2.3 2.4 2.4 2.4  --  2.2  PHOS  --  3.8 3.4 3.2 4.5 2.6   GFR: Estimated Creatinine Clearance: 34.7 mL/min (A) (by C-G formula based on SCr of 1.63 mg/dL (H)). Recent Labs  Lab 12/30/22 0436 12/30/22 0440 12/31/22 0637 01/01/23 0600 01/02/23 0442 01/03/23 0507 01/04/23 0345  PROCALCITON 1.05  --  1.63  --   --   --   --   WBC  --    < > 13.5* 11.7* 11.3* 13.4* 11.4*   < > = values in this interval not displayed.    Liver Function Tests: Recent Labs  Lab 01/01/23 0600 01/03/23 0507 01/03/23 1659 01/04/23 0345  AST 52*  --   --   --   ALT 33  --   --   --   ALKPHOS 106  --   --   --   BILITOT 1.0  --   --   --   PROT 6.7  --   --   --   ALBUMIN 1.8* 1.9* 1.7* 1.8*   No results for input(s): "LIPASE", "AMYLASE" in the last 168 hours. Recent Labs  Lab 12/29/22 1113 01/01/23 1207  AMMONIA 44* 57*    ABG    Component Value Date/Time   PHART 7.44 12/31/2022 0855   PCO2ART 45 12/31/2022 0855   PO2ART 64 (L) 12/31/2022 0855   HCO3 30.6 (H)  12/31/2022 0855   ACIDBASEDEF 3.3 (H) 12/29/2022 0206   O2SAT 94.2 12/31/2022 0855     Coagulation Profile: Recent Labs  Lab 01/01/23 1623  INR 1.3*    Cardiac Enzymes: No results for input(s): "CKTOTAL", "CKMB", "CKMBINDEX", "TROPONINI" in the last 168 hours.   HbA1C: Hgb A1c MFr Bld  Date/Time Value Ref Range Status  11/20/2022 05:04 AM 7.4 (H) 4.8 - 5.6 % Final    Comment:    (NOTE)         Prediabetes: 5.7 - 6.4         Diabetes: >6.4         Glycemic control for adults with diabetes: <7.0   02/15/2022 05:57 AM 5.9 (H) 4.8 - 5.6 % Final    Comment:    (NOTE)         Prediabetes: 5.7 - 6.4         Diabetes: >6.4         Glycemic control for adults with diabetes: <7.0     CBG: Recent Labs  Lab 01/03/23 1307 01/03/23 1610 01/03/23 1950 01/03/23 2307 01/04/23 0306  GLUCAP 181* 145* 167* 150* 162*    Review of Systems:   Unable to assess pt mechanically intubated, AMS, critical illness  Past Medical History:  He,  has a past medical history of Anxiety, Arthritis, Asthma, Colon polyps, COPD (chronic obstructive pulmonary disease) ([place]), Depression, Diabetes ([place]), Difficult intubation, GERD (gastroesophageal reflux disease), High cholesterol, Hyperlipidemia, Hypertension, Hyponatremia (02/23/2022), and Sleep apnea.   Surgical History:   Past Surgical History:  Procedure Laterality Date   BRONCHOSCOPY     CARDIAC CATHETERIZATION Left 11/04/2016   Procedure: Left Heart Cath and Coronary Angiography;  Surgeon: Teodoro Spray, MD;  Location: [place] [facility];  Service: Cardiovascular;  Laterality: Left;   CARDIAC CATHETERIZATION     CHOLECYSTECTOMY N/A 05/09/2019   Procedure: LAPAROSCOPIC CHOLECYSTECTOMY WITH INTRAOPERATIVE CHOLANGIOGRAM;  Surgeon: Jules Husbands, MD;  Location: [facility];  Service: General;  Laterality: N/A;   COLOSTOMY  IR FL GUIDED LOC OF NEEDLE/CATH TIP FOR SPINAL INJECTION RT  01/03/2023   IR KYPHO LUMBAR INC FX REDUCE BONE BX UNI/BIL  CANNULATION INC/IMAGING  11/10/2022   IR RADIOLOGIST EVAL & MGMT  11/08/2022   LEFT HEART CATH AND CORONARY ANGIOGRAPHY N/A 12/14/2021   Procedure: LEFT HEART CATH AND CORONARY ANGIOGRAPHY;  Surgeon: Corey Skains, MD;  Location: [place] [facility];  Service: Cardiovascular;  Laterality: N/A;   ROTATOR CUFF REPAIR Left    THUMB ARTHROSCOPY  2015   TONSILLECTOMY  1952   TONSILLECTOMY       Social History:   reports that he has quit smoking. His smoking use included cigarettes. He has a 15.00 pack-year smoking history. He has never used smokeless tobacco. He reports current alcohol use of about 1.0 - 2.0 standard drink of alcohol per week. He reports that he does not use drugs.   Family History:  His family history includes Heart attack in his mother; Leukemia in his brother; Prostate cancer in his brother and father.   Allergies Allergies  Allergen Reactions   Ace Inhibitors Cough   Metoprolol Diarrhea    Patient has been tolerating Propranolol 40 mg twice daily since at least 05/2022.   Nsaids Rash   Sulfa Antibiotics Rash     Home Medications  Prior to Admission medications   Medication Sig Start Date End Date Taking? Authorizing Provider  albuterol (ACCUNEB) 1.25 MG/3ML nebulizer solution Take 3 mLs by nebulization every 6 (six) hours as needed for wheezing. 11/20/22  Yes Wieting, Richard, MD  ALPRAZolam Duanne Moron) 0.5 MG tablet Take 0.5 mg by mouth daily as needed for anxiety. 10/06/22 10/06/23 Yes [provider]  aspirin EC 81 MG tablet Take 81 mg by mouth daily. Swallow whole.   Yes [provider]  diltiazem (CARDIZEM CD) 240 MG 24 hr capsule Take 240 mg by mouth every morning. 12/07/21  Yes [provider]  donepezil (ARICEPT) 5 MG tablet Take 5 mg by mouth at bedtime. 05/10/22  Yes [provider]  fluticasone (FLONASE) 50 MCG/ACT nasal spray Place 1 spray into both nostrils daily as needed for allergies or rhinitis.   Yes [provider]  folic acid (FOLVITE) 1 MG tablet Take 1 tablet (1 mg total) by mouth daily. 03/01/22  Yes Wieting, Richard, MD  gabapentin (NEURONTIN) 300 MG capsule Take 1 capsule (300 mg total) by mouth at bedtime. 02/28/22  Yes Wieting, Richard, MD  melatonin 5 MG TABS Take 1 tablet (5 mg total) by mouth at bedtime. 02/28/22  Yes Wieting, Richard, MD  nitroGLYCERIN (NITROSTAT) 0.4 MG SL tablet Place 0.4 mg under the tongue every 5 (five) minutes as needed for chest pain.   Yes [provider]  omeprazole (PRILOSEC) 40 MG capsule Take 40 mg by mouth daily. 04/20/22  Yes [provider]  ondansetron (ZOFRAN-ODT) 4 MG disintegrating tablet Take 1 tablet (4 mg total) by mouth every 8 (eight) hours as needed for nausea or vomiting. 06/11/22  Yes Carrie Mew, MD  propranolol (INDERAL) 40 MG tablet Take 40 mg by mouth 2 (two) times daily. 11/07/22  Yes [provider]  QUEtiapine (SEROQUEL) 25 MG tablet One tab po qhs (can take extra dose if not sleeping) 02/28/22  Yes Wieting, Richard, MD  rosuvastatin (CRESTOR) 5 MG tablet Take 5 mg by mouth daily. 05/10/22  Yes [provider]  SYMBICORT 80-4.5 MCG/ACT inhaler Inhale 2 puffs into the lungs 2 (two) times daily as needed.  05/19/20  Yes [provider]  venlafaxine XR (EFFEXOR-XR) 37.5 MG 24 hr capsule Take 37.5 mg by mouth daily. 12/07/21  Yes [provider]  azithromycin (ZITHROMAX) 250 MG tablet One tab po daily for three days Patient not taking: Reported on 11/27/2022 11/21/22   Loletha Grayer, MD  guaiFENesin (ROBITUSSIN) 100 MG/5ML liquid Take 5 mLs by mouth every 4 (four) hours as needed for cough or to loosen phlegm. Patient not taking: Reported on 11/30/2022 02/28/22   Loletha Grayer, MD  polyethylene glycol (MIRALAX / GLYCOLAX) 17 g packet Take 17 g by mouth daily as needed for moderate constipation. Patient not taking: Reported on 11/08/2022 02/28/22   Loletha Grayer, MD     Scheduled  Meds:  Chlorhexidine Gluconate Cloth  6 each Topical Daily   docusate  100 mg Per Tube BID   fentaNYL (SUBLIMAZE) injection  100 mcg Intravenous Once   folic acid  1 mg Per Tube Daily   free water  30 mL Per Tube Q4H   heparin injection (subcutaneous)  5,000 Units Subcutaneous Q8H   insulin aspart  0-20 Units Subcutaneous Q4H   insulin aspart  8 Units Subcutaneous Q4H   liver oil-zinc oxide   Topical QID   midazolam  2 mg Intravenous Once   multivitamin  1 tablet Per Tube QHS   nutrition supplement (JUVEN)  1 packet Per Tube BID BM   mouth rinse  15 mL Mouth Rinse Q2H   pantoprazole (PROTONIX) IV  40 mg Intravenous QHS   polyethylene glycol  17 g Per Tube Daily   thiamine  100 mg Per Tube Daily   vecuronium  10 mg Intravenous Once   venlafaxine  18.75 mg Per Tube BID WC   vitamin B-12  100 mcg Per Tube Daily   Continuous Infusions:   prismasol BGK 4/2.5 400 mL/hr at 01/03/23 1924    prismasol BGK 4/2.5 400 mL/hr at 01/03/23 1924   sodium chloride     anticoagulant sodium citrate     feeding supplement (VITAL AF 1.2 CAL) 1,000 mL (01/04/23 0626)   prismasol BGK 4/2.5 1,500 mL/hr at 01/03/23 1924   PRN Meds:.albuterol, alteplase, anticoagulant sodium citrate, fentaNYL (SUBLIMAZE) injection, heparin, heparin, heparin sodium (porcine), hydrALAZINE, lidocaine (PF), lidocaine-prilocaine, mouth rinse, pentafluoroprop-tetrafluoroeth   Critical care time:  40 minutes    Donell Beers, [place] Pager [phone] (please enter 7 digits) PCCM Consult Pager [phone] (please enter 7 digits)

## 2023-01-04 NOTE — Progress Notes (Signed)
PT did 2 hrs  of HD, tolerated well  UF = 0    01/04/23 1104  Vitals  Temp 99.2 F (37.3 C)  Temp Source Oral  BP 123/69  MAP (mmHg) 86  BP Location Left Leg  BP Method Automatic  Patient Position (if appropriate) Lying  Pulse Rate (!) 109  Pulse Rate Source Monitor  ECG Heart Rate (!) 110  Resp (!) 28  Oxygen Therapy  SpO2 94 %  O2 Device Ventilator  Patient Activity (if Appropriate) In bed  Pulse Oximetry Type Continuous  Post Treatment  Dialyzer Clearance Clear  Duration of HD Treatment -hour(s) 2 hour(s)  Hemodialysis Intake (mL) 0 mL  Liters Processed 27.2  Fluid Removed (mL) 0 mL  Tolerated HD Treatment Yes  Post-Hemodialysis Comments tolerated well  Hemodialysis Catheter Left Internal jugular Triple lumen Temporary (Non-Tunneled)  Placement Date: 01/02/23   Time Out: Correct patient;Correct site;Correct procedure  Maximum sterile barrier precautions: Hand hygiene;Cap;Mask;Sterile gown;Sterile gloves;Large sterile sheet  Site Prep: Chlorhexidine (preferred)  Verification by Antonieta Loveless...  Site Condition No complications  Blue Lumen Status Heparin locked;Dead end cap in place  Red Lumen Status Heparin locked;Dead end cap in place  Catheter fill solution Heparin 1000 units/ml  Catheter fill volume (Arterial) 1.4 cc  Catheter fill volume (Venous) 1.4  Dressing Type Transparent  Dressing Status Antimicrobial disc in place  Interventions Dressing changed  Drainage Description None  Dressing Change Due 01/11/23  Post treatment catheter status Capped and Clamped

## 2023-01-05 ENCOUNTER — Inpatient Hospital Stay: Payer: Medicare Other

## 2023-01-05 DIAGNOSIS — R569 Unspecified convulsions: Secondary | ICD-10-CM | POA: Diagnosis not present

## 2023-01-05 DIAGNOSIS — Z7189 Other specified counseling: Secondary | ICD-10-CM | POA: Diagnosis not present

## 2023-01-05 LAB — CBC
HCT: 24.5 % — ABNORMAL LOW (ref 39.0–52.0)
Hemoglobin: 7.5 g/dL — ABNORMAL LOW (ref 13.0–17.0)
MCH: 32.3 pg (ref 26.0–34.0)
MCHC: 30.6 g/dL (ref 30.0–36.0)
MCV: 105.6 fL — ABNORMAL HIGH (ref 80.0–100.0)
Platelets: 354 10*3/uL (ref 150–400)
RBC: 2.32 MIL/uL — ABNORMAL LOW (ref 4.22–5.81)
RDW: 18.7 % — ABNORMAL HIGH (ref 11.5–15.5)
WBC: 12.1 10*3/uL — ABNORMAL HIGH (ref 4.0–10.5)
nRBC: 1.8 % — ABNORMAL HIGH (ref 0.0–0.2)

## 2023-01-05 LAB — RENAL FUNCTION PANEL
Albumin: 1.7 g/dL — ABNORMAL LOW (ref 3.5–5.0)
Anion gap: 10 (ref 5–15)
BUN: 92 mg/dL — ABNORMAL HIGH (ref 8–23)
CO2: 28 mmol/L (ref 22–32)
Calcium: 8.4 mg/dL — ABNORMAL LOW (ref 8.9–10.3)
Chloride: 106 mmol/L (ref 98–111)
Creatinine, Ser: 1.4 mg/dL — ABNORMAL HIGH (ref 0.61–1.24)
GFR, Estimated: 51 mL/min — ABNORMAL LOW (ref >60)
Glucose, Bld: 203 mg/dL — ABNORMAL HIGH (ref 70–99)
Phosphorus: 2.9 mg/dL (ref 2.5–4.6)
Potassium: 3.9 mmol/L (ref 3.5–5.1)
Sodium: 144 mmol/L (ref 135–145)

## 2023-01-05 LAB — VDRL, CSF: VDRL Quant, CSF: NONREACTIVE

## 2023-01-05 LAB — GLUCOSE, CAPILLARY
Glucose-Capillary: 168 mg/dL — ABNORMAL HIGH (ref 70–99)
Glucose-Capillary: 182 mg/dL — ABNORMAL HIGH (ref 70–99)
Glucose-Capillary: 206 mg/dL — ABNORMAL HIGH (ref 70–99)
Glucose-Capillary: 240 mg/dL — ABNORMAL HIGH (ref 70–99)
Glucose-Capillary: 153 mg/dL — ABNORMAL HIGH (ref 70–99)
Glucose-Capillary: 162 mg/dL — ABNORMAL HIGH (ref 70–99)

## 2023-01-05 LAB — HEPATITIS B SURFACE ANTIBODY, QUANTITATIVE: Hep B S AB Quant (Post): 35.7 m[IU]/mL (ref >9.9)

## 2023-01-05 LAB — MAGNESIUM: Magnesium: 1.9 mg/dL (ref 1.7–2.4)

## 2023-01-05 MED ORDER — INSULIN ASPART 100 UNIT/ML IJ SOLN
10.0000 [IU] | INTRAMUSCULAR | Status: DC
  Administered 2023-01-05 – 2023-01-08 (×19): 10 [IU] via SUBCUTANEOUS
  Filled 2023-01-05 (×18): qty 1

## 2023-01-05 NOTE — Plan of Care (Signed)
Chest tube clamped for 6 hrs, VS stable, no significant hypoxia Follow up CXR shows resolution of RT PTX.   Chest tube removed without any issues  Vaseline guaze and tape placed over insertion site Follow up CXR pending

## 2023-01-05 NOTE — Progress Notes (Signed)
NAME:  Brett Blake, MRN:  [id number], DOB:  [birth year], LOS: 64 ADMISSION DATE:  12/07/2022, CONSULTATION DATE: 12/23/2022 REFERRING MD: Dr. Blaine Hamper, CHIEF COMPLAINT: AMS   History of Present Illness:  This is an 82 yo male with hx of ETOH Abuse who presented to [facility] ER on 12/28 via EMS from home following a possible seizure activity the morning of 12/28.  He has a hx of non epileptic seizures, however he does not take antiepileptics.  He reported he has not had a seizure in a long time.  All hx obtained from pts chart and daughter.   ED Course Upon arrival to the ER pt noted to have severe tremors.  Pt reported he drinks 1 small alcoholic beverage every night prior to going to bed.  However, pts wife reported he drank hard liquor heavily over the past several days and abruptly stopped drinking 2 days ago.  He also complained of an upset stomach.  ER lab results revealed K+ 3.1/glucose 138/calcium 8.3/albumin 3.4/AST 81/ALT 62/total protein 6.4/magnesium 1.6/wbc 3.9/hgb 12.1/platelets 125.  COVID-19/Influenza A&B negative.  Pt had a CIWA score of 4, he received 1 mg of iv ativan.  Hospitalist initially contacted for hospital admission.  However, pt became increasingly agitated and combative despite receiving a total of 3 mg of iv ativan and 3 mg of IM haldol.  Security called to bedside.  PCCM team consulted due to pt requiring precedex gtt.  However, despite precedex gtt at maximal dose; additional 2 mg of iv ativan, 12.5 mg of iv fentanyl, and 2.5 mg of iv haldol pt remained combative/agitated requiring mechanical intubation.  Pt admitted to ICU per PCCM team for additional workup and treatment.    CT Head 12/28:  No evidence of acute intracranial abnormality. Mild chronic small vessel ischemic disease. CXR 12/28: No active cardiopulmonary disease.   Please see "significant hospital events" section below for full detailed hospital course.  Pertinent  Medical History  Anxiety  Arthritis   Asthma  Colon Polyps  COPD Depression  Type II Diabetes Mellitus Difficult Intubation  GERD  Hypercholesteremia  HLD HTN  Hyponatremia  OSA   Micro Data:  12/28: SARS-CoV-2 and influenza PCR>> negative 12/28: MRSA PCR>> negative 1/5: Tracheal aspirate>>ENTEROBACTER CLOACAE, STAPHYLOCOCCUS AUREUS  1/6: MRSA PCR>>negative  Antimicrobials:   Anti-infectives (From admission, onward)    Start     Dose/Rate Route Frequency Ordered Stop   12/30/22 2200  piperacillin-tazobactam (ZOSYN) IVPB 3.375 g        3.375 g 12.5 mL/hr over 240 Minutes Intravenous Every 8 hours 12/30/22 1902 01/04/23 0103       Significant Hospital Events: Including procedures, antibiotic start and stop dates in addition to other pertinent events   12/28: Pt admitted with possible seizure activity suspect secondary to severe ETOH withdrawal requiring mechanical intubation  12/28: CT Head>No evidence of acute intracranial abnormality. Mild chronic small vessel ischemic disease.  12/23/22- patient still having seizures despite propofol, the seizures do respond to Ativan.  Pharmacy is monitoring electrolytes.  He is weaning off levophed.  He will start feeds today.  ABG with res alkalosis and RR was reduced.  12/24/22- patient improved, dcd phenobarb and wake up assessment was very poor today with onset of tachyarrythmia in 140s and seizure like activity upon reduction of sedation.  12/25/22-  patient is critically ill, he is not tolerating reduction of sedation with emergence of seizure like activity and tachyarrythmia. Wife and nephew at bedside we discussed short term plan. Today  we have transitioned to herparin from lovenox due to worsening renal impairment.  12/26/22- patient was not able to tolerate reduction in sedation with devt of severe hypertension and tachyarrythmia 12/27/22:  AKI slowly improving.  Start Precedex to assist with WUA. 12/28/22: MR Brain WO Contrast>no acute or reversible finding. Generalized  age  related atrophy. Minimal small vessel change of the cerebral hemispheric white matter, less than often seen at this age. Chronic nonprogressive benign type  calcification within the posterolateral left thalamus. Sinus mucosal thickening and  layering fluid as often seen in patients with mechanical ventilation. Mastoid effusions as often seen in patients with mechanical ventilation.  12/29/22: Pt failed WUA  12/30/22: EEG negative for electrographic seizures, however severe diffuse slowing indicative of global cerebral dysfunction, medication effect, or both  12/30/22: No acute events overnight. Pt remains mechanically intubated FiO2 35%. Will perform WUA and possible SBT  01/01/23: Pt with continued poor neurological exam remains unresponsive despite not receiving sedating medications since 12/30/22.  CT Head and EEG negative.  Remains on minimal mechanical ventilator settings, however neurological exam impeding extubation 01/02/23:   Neuro exam unchanged which is precluding extubation.  Consult Neurology,  IR to evaluate for LP.  Consult Palliative Care for [place].  Code status changed to DNR.  HD catheter placed as family wishes to trial HD to see if would improve mental status. 01/03/23: Slightly more responsive this morning. ETT cuff with severe leak requiring exchange of ETT.  Plan for trial of HD.  Plan for LP with IR 01/04/23: Pt received 4 hrs of CRRT overnight.  First HD session today.  No acute events overnight.  Remains mechanically intubated  01/05/23: Pt is s/p 1st HD session neurological exam remains poor pt is still unable to follow commands, but opens eyes spontaneously.  He remains mechanically intubated on minimal vent settings   Interim History / Subjective:  As above under significant events   Objective   Blood pressure 123/63, pulse 98, temperature 100 F (37.8 C), temperature source Axillary, resp. rate (!) 25, height '5\' 4"'$  (1.626 m), weight 79.5 kg, SpO2 95 %.    Vent Mode:  PSV;CPAP FiO2 (%):  [35 %] 35 % Set Rate:  [16 bmp] 16 bmp Vt Set:  [400 mL] 400 mL PEEP:  [5 cmH20] 5 cmH20 Pressure Support:  [12 cmH20] 12 cmH20 Plateau Pressure:  [16 cmH20-18 cmH20] 18 cmH20   Intake/Output Summary (Last 24 hours) at 01/05/2023 0824 Last data filed at 01/05/2023 0550 Gross per 24 hour  Intake 700 ml  Output 1220 ml  Net -520 ml   Filed Weights   01/03/23 0423 01/04/23 0300 01/05/23 0300  Weight: 83.1 kg 81 kg 79.5 kg   Examination: General: Acutely on chronically-ill appearing male, mechanically ventilated.  In NAD HENT: Supple, difficult to assess for JVD due to body habitus, orally intubated Lungs: Coarse breath sounds throughout, no wheezing or rhonchi noted, even, non labored, occasionally overbreathes the vent Cardiovascular: Sinus tachycardia, s1s2, no r/g, 2+ radial/2+ distal, 2+ generalized edema  Abdomen: +BS x4, obese, soft, distended, rectal tube in place with stool present  Extremities: Normal bulk and tone, no deformities Neuro: Off sedation, corneal and gag reflexes present, PERRL, opening eyes spontaneously but unable to follow commands, not withdrawing from painful stimulation in the BUE, withdrawing from painful stimulation of BLE GU: Indwelling foley catheter draining yellow urine  Resolved Hospital Problem list     Assessment & Plan:   Intubation and mechanical ventilation for airway protection  in the setting of ETOH withdrawal  Right pneumothorax post intubation 01/03/23 s/p right-sided chest tube placement with resolution of pneumothorax Hx: OSA, COPD, Asthma, and Difficult Intubation  - Full vent support, implement lung protective strategies - Plateau pressures less than 30 cm H20 - Wean FiO2 & PEEP as tolerated to maintain O2 sats 88 to 92% - Follow intermittent Chest X-ray & ABG as needed - Spontaneous Breathing Trials when respiratory parameters met and mental status permits - Continue VAP Bundle - Bronchodilators & Pulmicort  nebs - Will clamp chest tube today and obtain CXR 4 hrs post clamping   Hypertension Tachycardia Hx: Hypercholesteremia   Echo 12/31/22: EF 60 to 60%; grade I diastolic dysfunction  - Continuous cardiac monitoring - Resume outpatient propanolol at lower dose 20 mg daily; Prn hydralazine for bp management  Acute kidney injury secondary to ATN - Renal US negative - Trend BMP  - Replace electrolytes as indicated  - Monitor UOP  - Avoid nephrotoxic medications - Nephrology following, appreciate input ~HD per recommendations   LLL pneumonia (ENTEROBACTER CLOACAE, STAPHYLOCOCCUS AUREUS) Fever - Monitor fever curve - Trend WBC's & Procalcitonin - Follow cultures as above - Completed abx course   Type II diabetes mellitus  - CBG's q4h; Target range of 140 to 180 - SSI and scheduled novolog  - Follow ICU Hypo/Hyperglycemia protocol  Possible seizure activity in the setting of ETOH withdrawal  Delirium Tremens Acute Metabolic Encephalopathy  Sedation needs in setting of mechanical ventilation CT Head on admission: negative for acute intracranial abnormality EEG 12/23/22: suggestive of severe to profound diffuse encephalopathy, nonspecific etiology but likely related to sedation. No seizures or epileptiform discharges were seen throughout the recording.  MRI 1/3: with no acute or reversible finding. Chronic age related changes EEG 1/5: negative for seizure activity  - Treatment of metabolic derangements as outlined above - Maintain a RASS goal of 0  - Currently off sedation - Avoid sedating medications as able - Daily wake up assessment  - Seizure precautions  - Pt has been hospitalized for 2 weeks ETOH withdrawal symptoms have resolved  - Continue thiamine, folic acid, and MVI - Continue lactulose: goal 1L of stool daily  - LP performed 01/09 meningitis/encephalitis panel negative  - Neurology following, appreciate input - Trialing HD to help alleviate azotemia to determine if  this will help neurological exam   Pt is critically ill.  Prognosis is guarded and overall long term prognosis is poor.  Pt is DNR status.  Palliative Care consulted to assist with [place]. Best Practice (right click and "Reselect all SmartList Selections" daily)   Diet/type: NPO, tube feeds DVT prophylaxis: Heparin SQ GI prophylaxis: PPI Lines: Left IJ Trialysis, and is still needed Foley:  Yes, and it is still needed Code Status:  DNR Last date of multidisciplinary goals of care discussion [01/05/23]  01/04/23: Will update pts family when they arrive at bedside  Labs   CBC: Recent Labs  Lab 01/01/23 0600 01/02/23 0442 01/03/23 0507 01/04/23 0345 01/05/23 0425  WBC 11.7* 11.3* 13.4* 11.4* 12.1*  NEUTROABS  --  9.6*  --   --   --   HGB 9.5* 9.5* 9.3* 8.2* 7.5*  HCT 29.4* 29.9* 28.9* 26.2* 24.5*  MCV 99.7 101.0* 100.7* 102.3* 105.6*  PLT 305 336 389 387 454    Basic Metabolic Panel: Recent Labs  Lab 01/01/23 1756 01/02/23 0442 01/03/23 0507 01/03/23 1659 01/04/23 0345 01/05/23 0422 01/05/23 0425  NA 141 143 145 147* 146* 144  --  K 4.1  4.1 4.1 4.2 4.0 4.1 3.9  --   CL 102 102 106 106 108 106  --   CO2 '26 28 26 29 28 28  '$ --   GLUCOSE 274* 228* 210* 132* 182* 203*  --   BUN 98* 98* 96* 98* 91* 92*  --   CREATININE 1.85* 1.93* 1.92* 2.02* 1.63* 1.40*  --   CALCIUM 8.7* 8.8* 8.9 9.0 8.5* 8.4*  --   MG 2.4 2.4 2.4  --  2.2  --  1.9  PHOS 3.8 3.4 3.2 4.5 2.6 2.9  --    GFR: Estimated Creatinine Clearance: 40.1 mL/min (A) (by C-G formula based on SCr of 1.4 mg/dL (H)). Recent Labs  Lab 12/30/22 0436 12/30/22 0440 12/31/22 0637 01/01/23 0600 01/02/23 0442 01/03/23 0507 01/04/23 0345 01/05/23 0425  PROCALCITON 1.05  --  1.63  --   --   --   --   --   WBC  --    < > 13.5*   < > 11.3* 13.4* 11.4* 12.1*   < > = values in this interval not displayed.    Liver Function Tests: Recent Labs  Lab 01/01/23 0600 01/03/23 0507 01/03/23 1659 01/04/23 0345  01/05/23 0422  AST 52*  --   --   --   --   ALT 33  --   --   --   --   ALKPHOS 106  --   --   --   --   BILITOT 1.0  --   --   --   --   PROT 6.7  --   --   --   --   ALBUMIN 1.8* 1.9* 1.7* 1.8* 1.7*   No results for input(s): "LIPASE", "AMYLASE" in the last 168 hours. Recent Labs  Lab 12/29/22 1113 01/01/23 1207  AMMONIA 44* 57*    ABG    Component Value Date/Time   PHART 7.44 12/31/2022 0855   PCO2ART 45 12/31/2022 0855   PO2ART 64 (L) 12/31/2022 0855   HCO3 30.6 (H) 12/31/2022 0855   ACIDBASEDEF 3.3 (H) 12/29/2022 0206   O2SAT 94.2 12/31/2022 0855     Coagulation Profile: Recent Labs  Lab 01/01/23 1623  INR 1.3*    Cardiac Enzymes: No results for input(s): "CKTOTAL", "CKMB", "CKMBINDEX", "TROPONINI" in the last 168 hours.   HbA1C: Hgb A1c MFr Bld  Date/Time Value Ref Range Status  11/20/2022 05:04 AM 7.4 (H) 4.8 - 5.6 % Final    Comment:    (NOTE)         Prediabetes: 5.7 - 6.4         Diabetes: >6.4         Glycemic control for adults with diabetes: <7.0   02/15/2022 05:57 AM 5.9 (H) 4.8 - 5.6 % Final    Comment:    (NOTE)         Prediabetes: 5.7 - 6.4         Diabetes: >6.4         Glycemic control for adults with diabetes: <7.0     CBG: Recent Labs  Lab 01/04/23 1600 01/04/23 1911 01/04/23 2308 01/05/23 0325 01/05/23 0728  GLUCAP 179* 226* 167* 168* 182*    Review of Systems:   Unable to assess pt mechanically intubated, AMS, critical illness  Past Medical History:  He,  has a past medical history of Anxiety, Arthritis, Asthma, Colon polyps, COPD (chronic obstructive pulmonary disease) ([place]), Depression, Diabetes ([place]), Difficult intubation,  GERD (gastroesophageal reflux disease), High cholesterol, Hyperlipidemia, Hypertension, Hyponatremia (02/23/2022), and Sleep apnea.   Surgical History:   Past Surgical History:  Procedure Laterality Date   BRONCHOSCOPY     CARDIAC CATHETERIZATION Left 11/04/2016   Procedure: Left Heart Cath  and Coronary Angiography;  Surgeon: Teodoro Spray, MD;  Location: [facility];  Service: Cardiovascular;  Laterality: Left;   CARDIAC CATHETERIZATION     CHOLECYSTECTOMY N/A 05/09/2019   Procedure: LAPAROSCOPIC CHOLECYSTECTOMY WITH INTRAOPERATIVE CHOLANGIOGRAM;  Surgeon: Jules Husbands, MD;  Location: [facility];  Service: General;  Laterality: N/A;   COLOSTOMY     IR FL GUIDED LOC OF NEEDLE/CATH TIP FOR SPINAL INJECTION RT  01/03/2023   IR KYPHO LUMBAR INC FX REDUCE BONE BX UNI/BIL CANNULATION INC/IMAGING  11/10/2022   IR RADIOLOGIST EVAL & MGMT  11/08/2022   LEFT HEART CATH AND CORONARY ANGIOGRAPHY N/A 12/14/2021   Procedure: LEFT HEART CATH AND CORONARY ANGIOGRAPHY;  Surgeon: Corey Skains, MD;  Location: [facility];  Service: Cardiovascular;  Laterality: N/A;   ROTATOR CUFF REPAIR Left    THUMB ARTHROSCOPY  2015   TONSILLECTOMY  1952   TONSILLECTOMY       Social History:   reports that he has quit smoking. His smoking use included cigarettes. He has a 15.00 pack-year smoking history. He has never used smokeless tobacco. He reports current alcohol use of about 1.0 - 2.0 standard drink of alcohol per week. He reports that he does not use drugs.   Family History:  His family history includes Heart attack in his mother; Leukemia in his brother; Prostate cancer in his brother and father.   Allergies Allergies  Allergen Reactions   Ace Inhibitors Cough   Metoprolol Diarrhea    Patient has been tolerating Propranolol 40 mg twice daily since at least 05/2022.   Nsaids Rash   Sulfa Antibiotics Rash     Home Medications  Prior to Admission medications   Medication Sig Start Date End Date Taking? Authorizing Provider  albuterol (ACCUNEB) 1.25 MG/3ML nebulizer solution Take 3 mLs by nebulization every 6 (six) hours as needed for wheezing. 11/20/22  Yes Wieting, Richard, MD  ALPRAZolam Duanne Moron) 0.5 MG tablet Take 0.5 mg by mouth daily as needed for anxiety. 10/06/22  10/06/23 Yes [provider]  aspirin EC 81 MG tablet Take 81 mg by mouth daily. Swallow whole.   Yes [provider]  diltiazem (CARDIZEM CD) 240 MG 24 hr capsule Take 240 mg by mouth every morning. 12/07/21  Yes [provider]  donepezil (ARICEPT) 5 MG tablet Take 5 mg by mouth at bedtime. 05/10/22  Yes [provider]  fluticasone (FLONASE) 50 MCG/ACT nasal spray Place 1 spray into both nostrils daily as needed for allergies or rhinitis.   Yes [provider]  folic acid (FOLVITE) 1 MG tablet Take 1 tablet (1 mg total) by mouth daily. 03/01/22  Yes Wieting, Richard, MD  gabapentin (NEURONTIN) 300 MG capsule Take 1 capsule (300 mg total) by mouth at bedtime. 02/28/22  Yes Wieting, Richard, MD  melatonin 5 MG TABS Take 1 tablet (5 mg total) by mouth at bedtime. 02/28/22  Yes Wieting, Richard, MD  nitroGLYCERIN (NITROSTAT) 0.4 MG SL tablet Place 0.4 mg under the tongue every 5 (five) minutes as needed for chest pain.   Yes [provider]  omeprazole (PRILOSEC) 40 MG capsule Take 40 mg by mouth daily. 04/20/22  Yes [provider]  ondansetron (ZOFRAN-ODT) 4 MG  disintegrating tablet Take 1 tablet (4 mg total) by mouth every 8 (eight) hours as needed for nausea or vomiting. 06/11/22  Yes Carrie Mew, MD  propranolol (INDERAL) 40 MG tablet Take 40 mg by mouth 2 (two) times daily. 11/07/22  Yes [provider]  QUEtiapine (SEROQUEL) 25 MG tablet One tab po qhs (can take extra dose if not sleeping) 02/28/22  Yes Wieting, Richard, MD  rosuvastatin (CRESTOR) 5 MG tablet Take 5 mg by mouth daily. 05/10/22  Yes [provider]  SYMBICORT 80-4.5 MCG/ACT inhaler Inhale 2 puffs into the lungs 2 (two) times daily as needed.  05/19/20  Yes [provider]  venlafaxine XR (EFFEXOR-XR) 37.5 MG 24 hr capsule Take 37.5 mg by mouth daily. 12/07/21  Yes [provider]  azithromycin (ZITHROMAX) 250 MG tablet One tab po daily  for three days Patient not taking: Reported on 12/02/2022 11/21/22   Loletha Grayer, MD  guaiFENesin (ROBITUSSIN) 100 MG/5ML liquid Take 5 mLs by mouth every 4 (four) hours as needed for cough or to loosen phlegm. Patient not taking: Reported on 12/10/2022 02/28/22   Loletha Grayer, MD  polyethylene glycol (MIRALAX / GLYCOLAX) 17 g packet Take 17 g by mouth daily as needed for moderate constipation. Patient not taking: Reported on 11/08/2022 02/28/22   Loletha Grayer, MD     Scheduled Meds:  vitamin C  500 mg Per Tube BID   Chlorhexidine Gluconate Cloth  6 each Topical Daily   docusate  100 mg Per Tube BID   folic acid  1 mg Per Tube Daily   free water  30 mL Per Tube Q4H   heparin injection (subcutaneous)  5,000 Units Subcutaneous Q8H   insulin aspart  0-20 Units Subcutaneous Q4H   insulin aspart  8 Units Subcutaneous Q4H   liver oil-zinc oxide   Topical QID   multivitamin  1 tablet Per Tube QHS   nutrition supplement (JUVEN)  1 packet Per Tube BID BM   mouth rinse  15 mL Mouth Rinse Q2H   pantoprazole (PROTONIX) IV  40 mg Intravenous QHS   polyethylene glycol  17 g Per Tube Daily   propranolol  20 mg Per Tube Daily   thiamine  100 mg Per Tube Daily   venlafaxine  18.75 mg Per Tube BID WC   vitamin B-12  100 mcg Per Tube Daily   zinc sulfate  220 mg Per Tube Daily   Continuous Infusions:  sodium chloride     anticoagulant sodium citrate     feeding supplement (VITAL AF 1.2 CAL) 70 mL/hr at 01/05/23 0200   PRN Meds:.acetaminophen, albuterol, alteplase, anticoagulant sodium citrate, fentaNYL (SUBLIMAZE) injection, heparin, heparin sodium (porcine), hydrALAZINE, lidocaine (PF), lidocaine-prilocaine, mouth rinse, pentafluoroprop-tetrafluoroeth   Critical care time:  40 minutes    Donell Beers, [place] Pager [phone] (please enter 7 digits) PCCM Consult Pager [phone] (please enter 7 digits)

## 2023-01-05 NOTE — Progress Notes (Signed)
Progress Note   Updated pts wife Garrus Gauthreaux and daughter Lelon Huh regarding pts condition and current plan of care.  All questions were answered.  They were appreciative to receive an update.  Donell Beers, [place] Pager [phone] (please enter 7 digits) PCCM Consult Pager [phone] (please enter 7 digits)

## 2023-01-05 NOTE — Progress Notes (Signed)
PT did 3hrs of HD, tolerated well with no issues  UF = 0    01/05/23 2100  Vitals  Temp 99.5 F (37.5 C)  Temp Source Oral  BP 134/76  MAP (mmHg) 93  BP Location Left Leg  BP Method Automatic  Patient Position (if appropriate) Lying  Pulse Rate (!) 116  Pulse Rate Source Monitor  ECG Heart Rate (!) 109  Resp (!) 33  Oxygen Therapy  SpO2 94 %  O2 Device Ventilator  Patient Activity (if Appropriate) In bed  Pulse Oximetry Type Continuous  Post Treatment  Dialyzer Clearance Lightly streaked  Duration of HD Treatment -hour(s) 3 hour(s)  Hemodialysis Intake (mL) 0 mL  Liters Processed 54  Fluid Removed (mL) 0 mL  Tolerated HD Treatment Yes  Post-Hemodialysis Comments tolerated well, no issues  Hemodialysis Catheter Left Internal jugular Triple lumen Temporary (Non-Tunneled)  Placement Date: 01/02/23   Time Out: Correct patient;Correct site;Correct procedure  Maximum sterile barrier precautions: Hand hygiene;Cap;Mask;Sterile gown;Sterile gloves;Large sterile sheet  Site Prep: Chlorhexidine (preferred)  Verification by Antonieta Loveless...  Site Condition No complications  Blue Lumen Status Heparin locked;Dead end cap in place  Red Lumen Status Heparin locked;Dead end cap in place  Catheter fill solution Heparin 1000 units/ml  Catheter fill volume (Arterial) 1.4 cc  Catheter fill volume (Venous) 1.4  Dressing Type Transparent  Dressing Status Antimicrobial disc in place  Interventions New dressing  Drainage Description None  Dressing Change Due 01/11/23  Post treatment catheter status Capped and Clamped

## 2023-01-05 NOTE — Progress Notes (Signed)
Progress note - Neurology    S:// Patient seen and examined.   No acute changes  Medications  Current Facility-Administered Medications:    0.9 %  sodium chloride infusion, 250 mL, Intravenous, Continuous, Meda Coffee, Dreama Saa, NP   acetaminophen (TYLENOL) tablet 650 mg, 650 mg, Oral, Q6H PRN, Flora Lipps, MD, 650 mg at 01/04/23 0801   albuterol (PROVENTIL) (2.5 MG/3ML) 0.083% nebulizer solution 2.5 mg, 2.5 mg, Nebulization, Q4H PRN, Aubery Lapping E, RPH   alteplase (CATHFLO ACTIVASE) injection 2 mg, 2 mg, Intracatheter, Once PRN, Colon Flattery, NP   anticoagulant sodium citrate solution 5 mL, 5 mL, Intracatheter, PRN, Colon Flattery, NP   ascorbic acid (VITAMIN C) tablet 500 mg, 500 mg, Per Tube, BID, Mortimer Fries, Kurian, MD, 500 mg at 01/05/23 1029   Chlorhexidine Gluconate Cloth 2 % PADS 6 each, 6 each, Topical, Daily, Ouma, Bing Neighbors, NP, 6 each at 01/05/23 1030   docusate (COLACE) 50 MG/5ML liquid 100 mg, 100 mg, Per Tube, BID, Teressa Lower, NP, 100 mg at 01/05/23 1029   feeding supplement (VITAL AF 1.2 CAL) liquid 1,000 mL, 1,000 mL, Per Tube, Continuous, Kasa, Maretta Bees, MD, Last Rate: 70 mL/hr at 01/05/23 1000, Infusion Verify at 01/05/23 1000   fentaNYL (SUBLIMAZE) injection 25 mcg, 25 mcg, Intravenous, Q1H PRN, Tonye Royalty, NP, 25 mcg at 88/91/69 4503   folic acid (FOLVITE) tablet 1 mg, 1 mg, Per Tube, Daily, Wynelle Cleveland, RPH, 1 mg at 01/05/23 1029   free water 30 mL, 30 mL, Per Tube, Q4H, Aleskerov, Fuad, MD, 30 mL at 01/05/23 0800   heparin injection 1,000 Units, 1,000 Units, Intracatheter, PRN, Colon Flattery, NP   heparin injection 5,000 Units, 5,000 Units, Subcutaneous, Q8H, Aleskerov, Fuad, MD, 5,000 Units at 01/05/23 0539   heparin sodium (porcine) injection 2,800 Units, 2,800 Units, Intravenous, BID PRN, Rust-Chester, Toribio Harbour L, NP   hydrALAZINE (APRESOLINE) injection 10-20 mg, 10-20 mg, Intravenous, Q6H PRN, Teressa Lower, NP, 20 mg at 01/03/23 0326    insulin aspart (novoLOG) injection 0-20 Units, 0-20 Units, Subcutaneous, Q4H, Rust-Chester, Britton L, NP, 4 Units at 01/05/23 0841   insulin aspart (novoLOG) injection 10 Units, 10 Units, Subcutaneous, Q4H, Kasa, Kurian, MD   lidocaine (PF) (XYLOCAINE) 1 % injection 5 mL, 5 mL, Intradermal, PRN, Breeze, Shantelle, NP   lidocaine-prilocaine (EMLA) cream 1 Application, 1 Application, Topical, PRN, Breeze, Shantelle, NP   liver oil-zinc oxide (DESITIN) 40 % ointment, , Topical, QID, Flora Lipps, MD, Given at 01/05/23 1030   multivitamin (RENA-VIT) tablet 1 tablet, 1 tablet, Per Tube, QHS, Flora Lipps, MD, 1 tablet at 01/04/23 2108   nutrition supplement (JUVEN) (JUVEN) powder packet 1 packet, 1 packet, Per Tube, BID BM, Flora Lipps, MD, 1 packet at 01/05/23 1030   Oral care mouth rinse, 15 mL, Mouth Rinse, Q2H, Aleskerov, Fuad, MD, 15 mL at 01/05/23 1029   Oral care mouth rinse, 15 mL, Mouth Rinse, PRN, Ottie Glazier, MD   pantoprazole (PROTONIX) injection 40 mg, 40 mg, Intravenous, QHS, Dgayli, Khabib, MD, 40 mg at 01/04/23 2105   pentafluoroprop-tetrafluoroeth (GEBAUERS) aerosol 1 Application, 1 Application, Topical, PRN, Breeze, Shantelle, NP   polyethylene glycol (MIRALAX / GLYCOLAX) packet 17 g, 17 g, Per Tube, Daily, Teressa Lower, NP, 17 g at 01/05/23 1029   propranolol (INDERAL) 20 MG/5ML solution 20 mg, 20 mg, Per Tube, Daily, Rust-Chester, Britton L, NP, 20 mg at 01/04/23 2108   thiamine (VITAMIN B1) tablet 100 mg, 100 mg, Per Tube, Daily, Benita Gutter,  RPH, 100 mg at 01/05/23 1029   venlafaxine (EFFEXOR) tablet 18.75 mg, 18.75 mg, Per Tube, BID WC, Coulter, Carolyn, RPH, 18.75 mg at 01/05/23 4742   vitamin B-12 (CYANOCOBALAMIN) tablet 100 mcg, 100 mcg, Per Tube, Daily, Dgayli, Khabib, MD, 100 mcg at 01/05/23 1029   zinc sulfate capsule 220 mg, 220 mg, Per Tube, Daily, Flora Lipps, MD, 220 mg at 01/05/23 1029   Exam: Current vital signs: BP (!) 150/76   Pulse (!) 106   Temp  98.4 F (36.9 C) (Oral)   Resp (!) 34   Ht '5\' 4"'$  (1.626 m)   Wt 79.5 kg   SpO2 93%   BMI 30.08 kg/m  Vital signs in last 24 hours: Temp:  [98.4 F (36.9 C)-100.6 F (38.1 C)] 98.4 F (36.9 C) (01/11 0730) Pulse Rate:  [92-127] 106 (01/11 1000) Resp:  [17-34] 34 (01/11 1000) BP: (93-150)/(54-76) 150/76 (01/11 0900) SpO2:  [92 %-97 %] 93 % (01/11 1000) FiO2 (%):  [35 %] 35 % (01/11 0730) Weight:  [79.5 kg] 79.5 kg (01/11 0300) General: Intubated, no sedation, getting 8 3. HEENT: Normocephalic, atraumatic CVs: Regular rhythm Abdomen nondistended nontender Respiratory: Vented Extremities warm well-perfused Neurological exam Intubated no sedation No spontaneous movement To voice, opens eyes Does not track Does not follow commands Pupils equal round reactive light Corneal reflexes present Breathing over the ventilator No withdrawal to noxious stimulation  Labs I have reviewed labs in epic and the results pertinent to this consultation are:  CBC    Component Value Date/Time   WBC 12.1 (H) 01/05/2023 0425   RBC 2.32 (L) 01/05/2023 0425   HGB 7.5 (L) 01/05/2023 0425   HCT 24.5 (L) 01/05/2023 0425   PLT 354 01/05/2023 0425   MCV 105.6 (H) 01/05/2023 0425   MCH 32.3 01/05/2023 0425   MCHC 30.6 01/05/2023 0425   RDW 18.7 (H) 01/05/2023 0425   LYMPHSABS 0.5 (L) 01/02/2023 0442   MONOABS 0.7 01/02/2023 0442   EOSABS 0.1 01/02/2023 0442   BASOSABS 0.1 01/02/2023 0442   CMP     Component Value Date/Time   NA 144 01/05/2023 0422   K 3.9 01/05/2023 0422   CL 106 01/05/2023 0422   CO2 28 01/05/2023 0422   GLUCOSE 203 (H) 01/05/2023 0422   BUN 92 (H) 01/05/2023 0422   CREATININE 1.40 (H) 01/05/2023 0422   CALCIUM 8.4 (L) 01/05/2023 0422   PROT 6.7 01/01/2023 0600   ALBUMIN 1.7 (L) 01/05/2023 0422   AST 52 (H) 01/01/2023 0600   ALT 33 01/01/2023 0600   ALKPHOS 106 01/01/2023 0600   BILITOT 1.0 01/01/2023 0600   GFRNONAA 51 (L) 01/05/2023 0422   GFRAA 52 (L)  07/16/2020 1408  TSH 3.7 B12 1 year ago was 279.  CSF analysis: Meningitis encephalitis panel negative. Glucose 123, protein 47, RBC 7 and 10, WBC 5 and 2.  Imaging I have reviewed the images obtained: MRI brain done on December 28, 2022 with no acute or reversible finding for altered mental status or seizure.  Chronic calcification in posterior lateral left thalamus, age-related generalized atrophy.  Neurodiagnostics EEG 12/30/2022 with severe diffuse slowing while on propofol.  No seizures seen. EEG on 12/23/2022 with generalized burst attenuation suggestive of severe to profound diffuse encephalopathy but likely related to sedation.  No seizures seen.  Assessment: 82 year old man past history of alcohol abuse COPD hypertension diabetes coronary artery disease history of nonepileptic spells memory changes presenting for complaints of seizures all over, initially reporting drinking 1  drink a night but turns out had been abusing alcohol for the past few days with sudden discontinuation 2 days prior to presentation.  Admitted to the ICU for delirium tremens.  Also noted to have AKI which is being corrected.  Mild hyperammonemia also noted which is being treated by the primary team. Has been off of sedation for the past few days and is still very unresponsive for which neurological consultation was obtained. MRI with no evidence of stroke or reversible causes of altered mental status or seizures. EEG with profound background suppression due to sedation-no seizures.  Clinical presentation likely consistent with toxic metabolic encephalopathy due to multiple metabolic derangements and likely alcohol withdrawal/delirium tremens making this more protracted than to be expected.  His underlying renal dysfunction and other metabolic derangements may also be contributing to the prolonged encephalopathy.  Suspicion for underlying CNS infection or inflammatory process was low but an LP was ordered-CSF findings  do not support infectious or inflammatory process. Repeat EEG with no evidence of seizures  Recommendations: Management of toxic metabolic derangements per primary team as you are.  Few sessions of dialysis may help. CSF not concerning for infection/inflammatory process. I suspect that his presentation is more consistent with alcohol related delirium tremens and metabolic encephalopathy rather than an infectious or CNS inflammatory cause and because of his prior alcohol abuse, he may take time to come around even after correction of his metabolic derangements and his lab tests that have now started to trend in a positive direction. Historically has had low B12-continue B12 and thiamine replenishment.  I will continue to follow with you. Plan was discussed with the CCM team-Daniel Meda Coffee, NP  -- Amie Portland, MD Neurologist [facility] Pager: [phone]

## 2023-01-05 NOTE — Progress Notes (Addendum)
[facility]  PROGRESS NOTE   Subjective:   Patient remains in ICU   Vent support, 35% FiO2 No sedation, responsive to name, not pain Tube feeds 96m/hr  Creatinine 1.4 BUN 92 UOP 1.2L in proceeding 24 hours   Objective:  Vital signs: Blood pressure (!) 150/76, pulse (!) 106, temperature 98.4 F (36.9 C), temperature source Oral, resp. rate (!) 34, height '5\' 4"'$  (1.626 m), weight 79.5 kg, SpO2 93 %.  Intake/Output Summary (Last 24 hours) at 01/05/2023 1012 Last data filed at 01/05/2023 1000 Gross per 24 hour  Intake 1170 ml  Output 1420 ml  Net -250 ml    Filed Weights   01/03/23 0423 01/04/23 0300 01/05/23 0300  Weight: 83.1 kg 81 kg 79.5 kg     Physical Exam: General: Critically ill-appearing  Head:  Normocephalic, atraumatic.   Eyes:  Anicteric  Lungs:   Clear to auscultation, normal effort  Heart:  S1S2 no rubs  Abdomen:   Soft, nontender, bowel sounds present  Extremities:  Trace peripheral edema.  Neurologic:  Poorly responsive  Skin:  No lesions  Access: Left IJ temporary hemodialysis catheter    Basic Metabolic Panel: Recent Labs  Lab 01/01/23 1756 01/02/23 0442 01/03/23 0507 01/03/23 1659 01/04/23 0345 01/05/23 0422 01/05/23 0425  NA 141 143 145 147* 146* 144  --   K 4.1  4.1 4.1 4.2 4.0 4.1 3.9  --   CL 102 102 106 106 108 106  --   CO2 '26 28 26 29 28 28  '$ --   GLUCOSE 274* 228* 210* 132* 182* 203*  --   BUN 98* 98* 96* 98* 91* 92*  --   CREATININE 1.85* 1.93* 1.92* 2.02* 1.63* 1.40*  --   CALCIUM 8.7* 8.8* 8.9 9.0 8.5* 8.4*  --   MG 2.4 2.4 2.4  --  2.2  --  1.9  PHOS 3.8 3.4 3.2 4.5 2.6 2.9  --     GFR: Estimated Creatinine Clearance: 40.1 mL/min (A) (by C-G formula based on SCr of 1.4 mg/dL (H)).  Liver Function Tests: Recent Labs  Lab 01/01/23 0600 01/03/23 0507 01/03/23 1659 01/04/23 0345 01/05/23 0422  AST 52*  --   --   --   --   ALT 33  --   --   --   --   ALKPHOS 106  --   --   --   --   BILITOT 1.0  --    --   --   --   PROT 6.7  --   --   --   --   ALBUMIN 1.8* 1.9* 1.7* 1.8* 1.7*    No results for input(s): "LIPASE", "AMYLASE" in the last 168 hours. Recent Labs  Lab 12/29/22 1113 01/01/23 1207  AMMONIA 44* 57*     CBC: Recent Labs  Lab 01/01/23 0600 01/02/23 0442 01/03/23 0507 01/04/23 0345 01/05/23 0425  WBC 11.7* 11.3* 13.4* 11.4* 12.1*  NEUTROABS  --  9.6*  --   --   --   HGB 9.5* 9.5* 9.3* 8.2* 7.5*  HCT 29.4* 29.9* 28.9* 26.2* 24.5*  MCV 99.7 101.0* 100.7* 102.3* 105.6*  PLT 305 336 389 387 354      HbA1C: Hgb A1c MFr Bld  Date/Time Value Ref Range Status  11/20/2022 05:04 AM 7.4 (H) 4.8 - 5.6 % Final    Comment:    (NOTE)         Prediabetes: 5.7 - 6.4  Diabetes: >6.4         Glycemic control for adults with diabetes: <7.0   02/15/2022 05:57 AM 5.9 (H) 4.8 - 5.6 % Final    Comment:    (NOTE)         Prediabetes: 5.7 - 6.4         Diabetes: >6.4         Glycemic control for adults with diabetes: <7.0     Urinalysis: No results for input(s): "COLORURINE", "LABSPEC", "PHURINE", "GLUCOSEU", "HGBUR", "BILIRUBINUR", "KETONESUR", "PROTEINUR", "UROBILINOGEN", "NITRITE", "LEUKOCYTESUR" in the last 72 hours.  Invalid input(s): "APPERANCEUR"    Imaging: DG Chest Port 1 View  Result Date: 01/04/2023 CLINICAL DATA:  Follow-up right pneumothorax and right chest tube. EXAM: PORTABLE CHEST 1 VIEW COMPARISON:  01/03/2023 and older studies. FINDINGS: Right inferior hemithorax chest tube is stable.  No pneumothorax. Opacities in the perihilar and medial lung bases suspected to be atelectasis. No pulmonary edema. Stable well-positioned endotracheal tube, left internal jugular central venous line and nasal/orogastric tube. IMPRESSION: 1. No significant change from the most recent prior exam. 2. Stable inferior right hemithorax chest tube.  No pneumothorax. 3. Perihilar medial lung base opacities suspect to be atelectasis. Pneumonia possible. No pulmonary edema.  Electronically Signed   By: Lajean Manes M.D.   On: 01/04/2023 10:20   IR FL GUIDED LOC OF NEEDLE/CATH TIP FOR SPINAL INJECT RT  Result Date: 01/03/2023 CLINICAL DATA:  Patient with altered mental status, decreased level of consciousness. Request received for diagnostic lumbar puncture. EXAM: DIAGNOSTIC LUMBAR PUNCTURE UNDER FLUOROSCOPIC GUIDANCE COMPARISON:  None Available. FLUOROSCOPY: Radiation Exposure Index (as provided by the fluoroscopic device): 4.20 mGy Kerma PROCEDURE: This procedure was performed by Reatha Armour, PA-C and supervised and interpreted by Dr Michaelle Birks Informed consent was obtained from the the patient and/or patient's representative prior to the procedure, including potential complications of headache, allergy, and pain. With the patient prone, the lower back was prepped with Betadine. 1% Lidocaine was used for local anesthesia. Lumbar puncture was performed at the L3-L4 level using a 22 gauge needle with return of clear CSF with an opening pressure of 25 cm water. Closing pressure of 15 cm water. 10 ml of CSF were obtained for laboratory studies. The patient tolerated the procedure well and there were no apparent complications. IMPRESSION: 1.  Successful, uncomplicated lumbar puncture at L3-L4 level. 10 mL of clear CSF submitted for analysis. 2. Documented opening pressure of 25 cm H20, and closing pressure of 15 cm H20 Electronically Signed   By: Michaelle Birks M.D.   On: 01/03/2023 16:20   DG Chest Port 1 View  Result Date: 01/03/2023 CLINICAL DATA:  Right pneumothorax, chest tube placement EXAM: PORTABLE CHEST 1 VIEW COMPARISON:  Previous studies including the examination done earlier today FINDINGS: There is interval placement of right chest tube with its tip in right lower lung field. There is resolution of right pneumothorax. Subcutaneous emphysema is seen in right chest wall. Tip of endotracheal tube is 4.5 cm above the carina. Enteric tube is noted traversing the esophagus.  Tip of left IJ central venous catheter is seen in superior vena cava. Increased interstitial markings are seen in both lungs suggesting scarring or interstitial pneumonia. No new focal infiltrates are seen. There is no significant pleural effusion. IMPRESSION: Interval clearing of right pneumothorax after placement of right chest tube. No new focal infiltrates are seen. Electronically Signed   By: Elmer Picker M.D.   On: 01/03/2023 13:09   DG  Chest Port 1 View  Addendum Date: 01/03/2023   ADDENDUM REPORT: 01/03/2023 12:19 ADDENDUM: Critical Value/emergent results were called by telephone at the time of interpretation on 01/03/2023 at 12:19 pm to provider [facility] , who verbally acknowledged these results. Electronically Signed   By: Marijo Conception M.D.   On: 01/03/2023 12:19   Result Date: 01/03/2023 CLINICAL DATA:  Status post intubation. EXAM: PORTABLE CHEST 1 VIEW COMPARISON:  January 02, 2023. FINDINGS: There is interval development of large right sided pneumothorax. Endotracheal and nasogastric tubes appear to be in good position. Left internal jugular catheter is unchanged. Minimal left basilar subsegmental atelectasis is noted. Bony thorax is unremarkable. IMPRESSION: Interval development of large right-sided pneumothorax. Electronically Signed: By: Marijo Conception M.D. On: 01/03/2023 12:11   DG Abd 1 View  Result Date: 01/03/2023 CLINICAL DATA:  82 year old male status post intubation and oral enteric tube placement. EXAM: ABDOMEN - 1 VIEW COMPARISON:  Portable abdomen 01/01/2023. FINDINGS: Portable AP semi upright view at 1159 hours. Enteric tube terminates in the stomach near midline. Side hole at the level of the gastric body. Stable cholecystectomy clips. Mid lumbar vertebral augmentation again noted. Nonobstructed visible bowel gas pattern. At least moderate sized right pneumothorax is visible, see portable chest x-ray reported separately. IMPRESSION: 1. At least moderate right side  pneumothorax is visible, see portable chest x-ray from the same time reported separately. 2. Satisfactory enteric tube placement into the stomach. Electronically Signed   By: Genevie Ann M.D.   On: 01/03/2023 12:14     Medications:    sodium chloride     anticoagulant sodium citrate     feeding supplement (VITAL AF 1.2 CAL) 70 mL/hr at 01/05/23 1000    vitamin C  500 mg Per Tube BID   Chlorhexidine Gluconate Cloth  6 each Topical Daily   docusate  100 mg Per Tube BID   folic acid  1 mg Per Tube Daily   free water  30 mL Per Tube Q4H   heparin injection (subcutaneous)  5,000 Units Subcutaneous Q8H   insulin aspart  0-20 Units Subcutaneous Q4H   insulin aspart  10 Units Subcutaneous Q4H   liver oil-zinc oxide   Topical QID   multivitamin  1 tablet Per Tube QHS   nutrition supplement (JUVEN)  1 packet Per Tube BID BM   mouth rinse  15 mL Mouth Rinse Q2H   pantoprazole (PROTONIX) IV  40 mg Intravenous QHS   polyethylene glycol  17 g Per Tube Daily   propranolol  20 mg Per Tube Daily   thiamine  100 mg Per Tube Daily   venlafaxine  18.75 mg Per Tube BID WC   vitamin B-12  100 mcg Per Tube Daily   zinc sulfate  220 mg Per Tube Daily    Assessment/ Plan:     [name] is a 82 y.o.  male with a PMHx of advanced COPD, asthma, diabetes mellitus type 2, GERD, hyperlipidemia, hypertension, obstructive sleep apnea, anxiety, who was admitted to [facility] on 11/27/2022 for evaluation of severe tremors.  Patient has history of alcohol intake nightly.  He developed significant delirium tremens upon admission and subsequently intubated for airway protection.    #1: Acute kidney injury: Patient with acute kidney injury most likely secondary to acute tubular necrosis due to hemodynamic compromise.  Renal sonogram was negative.   Urine output remains acceptable. Received dialysis yesterday to manage believed uremic symptoms. Will offer additional treatment today.   #  2: Acute respiratory failure:  Remains intubated with vent support   #3: Diabetes: Glucose well controlled with insulin management.    #4: Sepsis: Remains on Zosyn     LOS: [facility] 1/11/202410:12 AM

## 2023-01-05 NOTE — Progress Notes (Signed)
Daily Progress Note   Patient Name: Brett Blake       Date: 01/05/2023 DOB: May 23, 1941  Age: 82 y.o. MRN#: [id number] Attending Physician: Flora Lipps, MD Primary Care Physician: [name], MD Admit Date: 12/03/2022  Reason for Consultation/Follow-up: Establishing goals of care  Subjective: Notes and labs reviewed. Patient remains intubated. Family at bedside states they are taking things day by day. Plans for further dialysis to see if there is change in mental status.   Length of Stay: 14  Current Medications: Scheduled Meds:   vitamin C  500 mg Per Tube BID   Chlorhexidine Gluconate Cloth  6 each Topical Daily   docusate  100 mg Per Tube BID   folic acid  1 mg Per Tube Daily   free water  30 mL Per Tube Q4H   heparin injection (subcutaneous)  5,000 Units Subcutaneous Q8H   insulin aspart  0-20 Units Subcutaneous Q4H   insulin aspart  10 Units Subcutaneous Q4H   liver oil-zinc oxide   Topical QID   multivitamin  1 tablet Per Tube QHS   nutrition supplement (JUVEN)  1 packet Per Tube BID BM   mouth rinse  15 mL Mouth Rinse Q2H   pantoprazole (PROTONIX) IV  40 mg Intravenous QHS   polyethylene glycol  17 g Per Tube Daily   propranolol  20 mg Per Tube Daily   thiamine  100 mg Per Tube Daily   venlafaxine  18.75 mg Per Tube BID WC   vitamin B-12  100 mcg Per Tube Daily   zinc sulfate  220 mg Per Tube Daily    Continuous Infusions:  sodium chloride     anticoagulant sodium citrate     feeding supplement (VITAL AF 1.2 CAL) 70 mL/hr at 01/05/23 1400    PRN Meds: acetaminophen, albuterol, alteplase, anticoagulant sodium citrate, fentaNYL (SUBLIMAZE) injection, heparin, heparin sodium (porcine), hydrALAZINE, lidocaine (PF), lidocaine-prilocaine, mouth rinse,  pentafluoroprop-tetrafluoroeth  Physical Exam Constitutional:      Comments: Eyes closed  Pulmonary:     Comments: On ventilator.             Vital Signs: BP 105/60   Pulse (!) 115   Temp 98.4 F (36.9 C) (Oral)   Resp (!) 26   Ht '5\' 4"'$  (1.626 m)   Wt  79.5 kg   SpO2 97%   BMI 30.08 kg/m  SpO2: SpO2: 97 % O2 Device: O2 Device: Ventilator O2 Flow Rate:    Intake/output summary:  Intake/Output Summary (Last 24 hours) at 01/05/2023 1543 Last data filed at 01/05/2023 1400 Gross per 24 hour  Intake 1170 ml  Output 1420 ml  Net -250 ml   LBM: Last BM Date : 01/04/23 Baseline Weight: Weight: 78 kg Most recent weight: Weight: 79.5 kg    Patient Active Problem List   Diagnosis Date Noted   Pneumothorax 01/03/2023   Acute renal failure ([place]) 01/01/2023   Acute hypoxic respiratory failure ([place]) 12/30/2022   Seizure-like activity ([place]) 12/05/2022   HLD (hyperlipidemia) 12/23/2022   Diabetes mellitus without complication ([place]) 13/07/6577   Dementia ([place]) 12/13/2022   Thrombocytopenia ([place]) 12/20/2022   Alcohol withdrawal ([place]) 12/11/2022   Acute encephalopathy 12/24/2022   COPD exacerbation ([place]) 11/18/2022   Anemia of chronic disease 02/27/2022   Hyponatremia 02/23/2022   Hypomagnesemia 02/21/2022   Hypokalemia 02/21/2022   Delirium tremens ([place]) 02/15/2022   COPD (chronic obstructive pulmonary disease) ([place]) 02/14/2022   GERD (gastroesophageal reflux disease) 02/14/2022   Cough 02/14/2022   Abnormal LFTs 02/14/2022   AMS (altered mental status) 02/14/2022   Unstable angina ([place]) 12/11/2021   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[place]) 11/17/2021   Sepsis ([place]) 05/20/2021   Acute gastroenteritis 05/20/2021   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   Code Status:    Code Status Orders  (From admission, onward)           Start     Ordered   01/02/23 1802  Do not attempt resuscitation (DNR)  Continuous       Question Answer Comment  If patient has no pulse and is not breathing Do Not Attempt Resuscitation   If patient has a pulse and/or is breathing: Medical Treatment Goals MEDICAL INTERVENTIONS DESIRED: Use advanced airway interventions, mechanical ventilation or cardioversion in appropriate circumstances; Use medication/IV fluids as indicated; Provide comfort medications; Transfer to Progressive/Stepdown/ICU as indicated.   Consent: Discussion documented in EHR or advanced directives reviewed      01/02/23 1801           Code Status History     Date Active Date Inactive Code Status Order ID Comments User Context   12/24/2022 1512 01/02/2023 1801 Full Code [id number]  Ivor Costa, MD ED   11/18/2022 1953 11/20/2022 1736 Full Code [id number]  Vianne Bulls, MD ED   02/14/2022 1956 02/28/2022 2027 Full Code [id number]  Para Skeans, MD ED   12/11/2021 0413 12/14/2021 2046 Full Code [id number]  Mansy, Arvella Merles, MD ED   05/20/2021 0345 05/21/2021 1853 Full Code [id number]  Athena Masse, MD ED   07/16/2020 1607 07/17/2020 1411 Full Code [id number]  Collier Bullock, MD ED   05/08/2019 2127 05/11/2019 1649 Full Code [id number]  Jules Husbands, MD ED   11/04/2016 0848 11/04/2016 1310 Full Code [id number]  Teodoro Spray, MD Inpatient   06/24/2015 1953  06/28/2015 1751 Full Code [id number]  Henreitta Leber, MD ED       Thank you for allowing the Palliative Medicine Team to assist in the care of this patient.    Asencion Gowda, NP  Please contact Palliative  Medicine Team phone at [phone] for questions and concerns.

## 2023-01-06 ENCOUNTER — Ambulatory Visit: Payer: Medicare Other

## 2023-01-06 ENCOUNTER — Inpatient Hospital Stay: Payer: Medicare Other

## 2023-01-06 DIAGNOSIS — R569 Unspecified convulsions: Secondary | ICD-10-CM | POA: Diagnosis not present

## 2023-01-06 DIAGNOSIS — Z7189 Other specified counseling: Secondary | ICD-10-CM | POA: Diagnosis not present

## 2023-01-06 LAB — HSV 1/2 PCR, CSF
HSV-1 DNA: NEGATIVE
HSV-2 DNA: NEGATIVE

## 2023-01-06 LAB — RENAL FUNCTION PANEL
Albumin: 1.8 g/dL — ABNORMAL LOW (ref 3.5–5.0)
Anion gap: 10 (ref 5–15)
BUN: 49 mg/dL — ABNORMAL HIGH (ref 8–23)
CO2: 30 mmol/L (ref 22–32)
Calcium: 8.1 mg/dL — ABNORMAL LOW (ref 8.9–10.3)
Chloride: 97 mmol/L — ABNORMAL LOW (ref 98–111)
Creatinine, Ser: 0.79 mg/dL (ref 0.61–1.24)
GFR, Estimated: >60 mL/min (ref >60)
Glucose, Bld: 180 mg/dL — ABNORMAL HIGH (ref 70–99)
Phosphorus: 2.1 mg/dL — ABNORMAL LOW (ref 2.5–4.6)
Potassium: 4 mmol/L (ref 3.5–5.1)
Sodium: 137 mmol/L (ref 135–145)

## 2023-01-06 LAB — CSF CULTURE W GRAM STAIN
Culture: NO GROWTH
Gram Stain: NONE SEEN

## 2023-01-06 LAB — GLUCOSE, CAPILLARY
Glucose-Capillary: 158 mg/dL — ABNORMAL HIGH (ref 70–99)
Glucose-Capillary: 173 mg/dL — ABNORMAL HIGH (ref 70–99)
Glucose-Capillary: 180 mg/dL — ABNORMAL HIGH (ref 70–99)
Glucose-Capillary: 187 mg/dL — ABNORMAL HIGH (ref 70–99)
Glucose-Capillary: 190 mg/dL — ABNORMAL HIGH (ref 70–99)
Glucose-Capillary: 236 mg/dL — ABNORMAL HIGH (ref 70–99)

## 2023-01-06 LAB — CBC
HCT: 24.8 % — ABNORMAL LOW (ref 39.0–52.0)
Hemoglobin: 7.5 g/dL — ABNORMAL LOW (ref 13.0–17.0)
MCH: 32.1 pg (ref 26.0–34.0)
MCHC: 30.2 g/dL (ref 30.0–36.0)
MCV: 106 fL — ABNORMAL HIGH (ref 80.0–100.0)
Platelets: 369 10*3/uL (ref 150–400)
RBC: 2.34 MIL/uL — ABNORMAL LOW (ref 4.22–5.81)
RDW: 18.1 % — ABNORMAL HIGH (ref 11.5–15.5)
WBC: 11.8 10*3/uL — ABNORMAL HIGH (ref 4.0–10.5)
nRBC: 2 % — ABNORMAL HIGH (ref 0.0–0.2)

## 2023-01-06 LAB — MAGNESIUM: Magnesium: 1.7 mg/dL (ref 1.7–2.4)

## 2023-01-06 MED ORDER — HEPARIN SODIUM (PORCINE) 1000 UNIT/ML IJ SOLN
INTRAMUSCULAR | Status: AC
  Filled 2023-01-06: qty 10

## 2023-01-06 MED ORDER — MIDAZOLAM HCL 2 MG/2ML IJ SOLN
2.0000 mg | INTRAMUSCULAR | Status: DC | PRN
  Administered 2023-01-06 – 2023-01-07 (×3): 2 mg via INTRAVENOUS
  Filled 2023-01-06 (×3): qty 2

## 2023-01-06 MED ORDER — FUROSEMIDE 10 MG/ML IJ SOLN
40.0000 mg | Freq: Once | INTRAMUSCULAR | Status: AC
  Administered 2023-01-06: 40 mg via INTRAVENOUS
  Filled 2023-01-06: qty 4

## 2023-01-06 NOTE — Progress Notes (Signed)
Progress note - Neurology    S:// Patient seen and examined.   No acute changes  Medications  Current Facility-Administered Medications:    0.9 %  sodium chloride infusion, 250 mL, Intravenous, Continuous, Meda Coffee, Dreama Saa, NP   acetaminophen (TYLENOL) tablet 650 mg, 650 mg, Oral, Q6H PRN, Flora Lipps, MD, 650 mg at 01/04/23 0801   albuterol (PROVENTIL) (2.5 MG/3ML) 0.083% nebulizer solution 2.5 mg, 2.5 mg, Nebulization, Q4H PRN, Aubery Lapping E, RPH   alteplase (CATHFLO ACTIVASE) injection 2 mg, 2 mg, Intracatheter, Once PRN, Colon Flattery, NP   anticoagulant sodium citrate solution 5 mL, 5 mL, Intracatheter, PRN, Colon Flattery, NP   ascorbic acid (VITAMIN C) tablet 500 mg, 500 mg, Per Tube, BID, Mortimer Fries, Kurian, MD, 500 mg at 01/06/23 1021   Chlorhexidine Gluconate Cloth 2 % PADS 6 each, 6 each, Topical, Daily, [place], Bing Neighbors, NP, 6 each at 01/05/23 1030   docusate (COLACE) 50 MG/5ML liquid 100 mg, 100 mg, Per Tube, BID, Teressa Lower, NP, 100 mg at 01/06/23 1021   feeding supplement (VITAL AF 1.2 CAL) liquid 1,000 mL, 1,000 mL, Per Tube, Continuous, Kasa, Kurian, MD, Last Rate: 70 mL/hr at 01/06/23 0600, Infusion Verify at 01/06/23 0600   fentaNYL (SUBLIMAZE) injection 25 mcg, 25 mcg, Intravenous, Q1H PRN, Tonye Royalty, NP, 25 mcg at 83/41/96 2229   folic acid (FOLVITE) tablet 1 mg, 1 mg, Per Tube, Daily, Wynelle Cleveland, RPH, 1 mg at 01/06/23 1021   free water 30 mL, 30 mL, Per Tube, Q4H, Aleskerov, Fuad, MD, 30 mL at 01/06/23 0741   furosemide (LASIX) injection 40 mg, 40 mg, Intravenous, Once, Breeze, Shantelle, NP   heparin injection 1,000 Units, 1,000 Units, Intracatheter, PRN, Colon Flattery, NP   heparin injection 5,000 Units, 5,000 Units, Subcutaneous, Q8H, Aleskerov, Fuad, MD, 5,000 Units at 01/06/23 0503   heparin sodium (porcine) injection 2,800 Units, 2,800 Units, Intravenous, BID PRN, Rust-Chester, Toribio Harbour L, NP   hydrALAZINE (APRESOLINE) injection  10-20 mg, 10-20 mg, Intravenous, Q6H PRN, Teressa Lower, NP, 20 mg at 01/03/23 0326   insulin aspart (novoLOG) injection 0-20 Units, 0-20 Units, Subcutaneous, Q4H, Rust-Chester, Britton L, NP, 4 Units at 01/06/23 0741   insulin aspart (novoLOG) injection 10 Units, 10 Units, Subcutaneous, Q4H, Kasa, Kurian, MD, 10 Units at 01/06/23 0741   lidocaine (PF) (XYLOCAINE) 1 % injection 5 mL, 5 mL, Intradermal, PRN, Breeze, Shantelle, NP   lidocaine-prilocaine (EMLA) cream 1 Application, 1 Application, Topical, PRN, Breeze, Shantelle, NP   liver oil-zinc oxide (DESITIN) 40 % ointment, , Topical, QID, Flora Lipps, MD, Given at 01/06/23 1022   multivitamin (RENA-VIT) tablet 1 tablet, 1 tablet, Per Tube, QHS, Flora Lipps, MD, 1 tablet at 01/05/23 2133   nutrition supplement (JUVEN) (JUVEN) powder packet 1 packet, 1 packet, Per Tube, BID BM, Flora Lipps, MD, 1 packet at 01/06/23 1022   Oral care mouth rinse, 15 mL, Mouth Rinse, Q2H, Aleskerov, Fuad, MD, 15 mL at 01/06/23 1022   Oral care mouth rinse, 15 mL, Mouth Rinse, PRN, Ottie Glazier, MD   pantoprazole (PROTONIX) injection 40 mg, 40 mg, Intravenous, QHS, Dgayli, Khabib, MD, 40 mg at 01/05/23 2129   pentafluoroprop-tetrafluoroeth (GEBAUERS) aerosol 1 Application, 1 Application, Topical, PRN, Breeze, Shantelle, NP   polyethylene glycol (MIRALAX / GLYCOLAX) packet 17 g, 17 g, Per Tube, Daily, Teressa Lower, NP, 17 g at 01/06/23 1021   propranolol (INDERAL) 20 MG/5ML solution 20 mg, 20 mg, Per Tube, Daily, Rust-Chester, Britton L, NP, 20 mg  at 01/06/23 1020   thiamine (VITAMIN B1) tablet 100 mg, 100 mg, Per Tube, Daily, Benita Gutter, RPH, 100 mg at 01/06/23 1021   venlafaxine [facility]) tablet 18.75 mg, 18.75 mg, Per Tube, BID WC, Coulter, Carolyn, RPH, 18.75 mg at 01/06/23 0745   vitamin B-12 (CYANOCOBALAMIN) tablet 100 mcg, 100 mcg, Per Tube, Daily, Dgayli, Khabib, MD, 100 mcg at 01/06/23 1021   zinc sulfate capsule 220 mg, 220 mg, Per Tube, Daily,  Flora Lipps, MD, 220 mg at 01/06/23 1021   Exam: Current vital signs: BP 135/69   Pulse (!) 103   Temp 98.5 F (36.9 C) (Oral)   Resp (!) 22   Ht '5\' 4"'$  (1.626 m)   Wt 79.1 kg   SpO2 95%   BMI 29.93 kg/m  Vital signs in last 24 hours: Temp:  [98.5 F (36.9 C)-99.9 F (37.7 C)] 98.5 F (36.9 C) (01/12 0722) Pulse Rate:  [50-127] 103 (01/12 1000) Resp:  [15-37] 22 (01/12 1000) BP: (105-163)/(60-91) 135/69 (01/12 1000) SpO2:  [92 %-97 %] 95 % (01/12 1000) FiO2 (%):  [35 %] 35 % (01/12 0731) Weight:  [79.1 kg] 79.1 kg (01/12 0300) General: Intubated, no sedation, getting 8 3. HEENT: Normocephalic, atraumatic CVs: Regular rhythm Abdomen nondistended nontender Respiratory: Vented Extremities warm well-perfused Neurological exam On no sedation Intubated Does not follow commands Mild anisocoria left pupil 2 mm right pupil 3 mm Breathing over the vent Corneal reflexes present Cough and gag present To noxious stimulation has very purposeful grimacing although no withdrawal noted in any of the extremities  Labs I have reviewed labs in epic and the results pertinent to this consultation are:  CBC    Component Value Date/Time   WBC 11.8 (H) 01/06/2023 0408   RBC 2.34 (L) 01/06/2023 0408   HGB 7.5 (L) 01/06/2023 0408   HCT 24.8 (L) 01/06/2023 0408   PLT 369 01/06/2023 0408   MCV 106.0 (H) 01/06/2023 0408   MCH 32.1 01/06/2023 0408   MCHC 30.2 01/06/2023 0408   RDW 18.1 (H) 01/06/2023 0408   LYMPHSABS 0.5 (L) 01/02/2023 0442   MONOABS 0.7 01/02/2023 0442   EOSABS 0.1 01/02/2023 0442   BASOSABS 0.1 01/02/2023 0442   CMP     Component Value Date/Time   NA 137 01/06/2023 0408   K 4.0 01/06/2023 0408   CL 97 (L) 01/06/2023 0408   CO2 30 01/06/2023 0408   GLUCOSE 180 (H) 01/06/2023 0408   BUN 49 (H) 01/06/2023 0408   CREATININE 0.79 01/06/2023 0408   CALCIUM 8.1 (L) 01/06/2023 0408   PROT 6.7 01/01/2023 0600   ALBUMIN 1.8 (L) 01/06/2023 0408   AST 52 (H)  01/01/2023 0600   ALT 33 01/01/2023 0600   ALKPHOS 106 01/01/2023 0600   BILITOT 1.0 01/01/2023 0600   GFRNONAA >60 01/06/2023 0408   GFRAA 52 (L) 07/16/2020 1408  TSH 3.7 B12 1 year ago was 279.  CSF analysis: Meningitis encephalitis panel negative. Glucose 123, protein 47, RBC 7 and 10, WBC 5 and 2.  Imaging I have reviewed the images obtained: MRI brain done on December 28, 2022 with no acute or reversible finding for altered mental status or seizure.  Chronic calcification in posterior lateral left thalamus, age-related generalized atrophy.  Neurodiagnostics EEG 12/30/2022 with severe diffuse slowing while on propofol.  No seizures seen. EEG on 12/23/2022 with generalized burst attenuation suggestive of severe to profound diffuse encephalopathy but likely related to sedation.  No seizures seen.  Assessment: 82 year old man  past history of alcohol abuse COPD hypertension diabetes coronary artery disease history of nonepileptic spells memory changes presenting for complaints of seizures all over, initially reporting drinking 1 drink a night but turns out had been abusing alcohol for the past few days with sudden discontinuation 2 days prior to presentation.  Admitted to the ICU for delirium tremens.  Also noted to have AKI which is being corrected.  Mild hyperammonemia also noted which is being treated by the primary team. Has been off of sedation for the past few days and is still very unresponsive for which neurological consultation was obtained. MRI with no evidence of stroke or reversible causes of altered mental status or seizures. EEG with profound background suppression due to sedation-no seizures.  Clinical presentation likely consistent with toxic metabolic encephalopathy due to multiple metabolic derangements and likely alcohol withdrawal/delirium tremens making this more protracted than to be expected.  His underlying renal dysfunction and other metabolic derangements may also be  contributing to the prolonged encephalopathy.  Suspicion for underlying CNS infection or inflammatory process was low but an LP was ordered-CSF findings do not support infectious or inflammatory process. Repeat EEG with no evidence of seizures  More purposeful in grimacing today to noxious stimulation than previous days.  Recommendations: Management of toxic metabolic derangements per primary team as you are.  Few sessions of dialysis may help. CSF not concerning for infection/inflammatory process. I suspect that his presentation is more consistent with alcohol related delirium tremens and metabolic encephalopathy rather than an infectious or CNS inflammatory cause and because of his prior alcohol abuse, he may take time to come around even after correction of his metabolic derangements and his lab tests that have now started to trend in a positive direction. Historically has had low B12-continue B12 and thiamine replenishment.  I will continue to follow with you. Plan was discussed with the CCM team-Dr. Lanney Gins  -- Amie Portland, MD Neurologist [facility] Pager: [phone]

## 2023-01-06 NOTE — Progress Notes (Signed)
NAME:  Brett Blake, MRN:  [id number], DOB:  [birth year], LOS: 40 ADMISSION DATE:  12/12/2022, CONSULTATION DATE: 12/05/2022 REFERRING MD: Dr. Blaine Hamper, CHIEF COMPLAINT: AMS   History of Present Illness:  This is an 82 yo male with hx of ETOH Abuse who presented to [facility] ER on 12/28 via EMS from home following a possible seizure activity the morning of 12/28.  He has a hx of non epileptic seizures, however he does not take antiepileptics.  He reported he has not had a seizure in a long time.  All hx obtained from pts chart and daughter.   01/06/23- patient remains critically ill on ventilator and is now on day 15 of hospitalization.  He has not been able to be liberated from MV.  Plan to discuss trache/PEG.  Neurologist anticipates poor prognosis with long recovery at [facility] at best for patient.    Pertinent  Medical History  Anxiety  Arthritis  Asthma  Colon Polyps  COPD Depression  Type II Diabetes Mellitus Difficult Intubation  GERD  Hypercholesteremia  HLD HTN  Hyponatremia  OSA   Micro Data:  12/28: SARS-CoV-2 and influenza PCR>> negative 12/28: MRSA PCR>> negative 1/5: Tracheal aspirate>>ENTEROBACTER CLOACAE, STAPHYLOCOCCUS AUREUS  1/6: MRSA PCR>>negative  Antimicrobials:   Anti-infectives (From admission, onward)    Start     Dose/Rate Route Frequency Ordered Stop   12/30/22 2200  piperacillin-tazobactam (ZOSYN) IVPB 3.375 g        3.375 g 12.5 mL/hr over 240 Minutes Intravenous Every 8 hours 12/30/22 1902 01/04/23 0103       Significant Hospital Events: Including procedures, antibiotic start and stop dates in addition to other pertinent events   12/28: Pt admitted with possible seizure activity suspect secondary to severe ETOH withdrawal requiring mechanical intubation  12/28: CT Head>No evidence of acute intracranial abnormality. Mild chronic small vessel ischemic disease.  12/23/22- patient still having seizures despite propofol, the seizures do respond to  Ativan.  Pharmacy is monitoring electrolytes.  He is weaning off levophed.  He will start feeds today.  ABG with res alkalosis and RR was reduced.  12/24/22- patient improved, dcd phenobarb and wake up assessment was very poor today with onset of tachyarrythmia in 140s and seizure like activity upon reduction of sedation.  12/25/22-  patient is critically ill, he is not tolerating reduction of sedation with emergence of seizure like activity and tachyarrythmia. Wife and nephew at bedside we discussed short term plan. Today we have transitioned to herparin from lovenox due to worsening renal impairment.  12/26/22- patient was not able to tolerate reduction in sedation with devt of severe hypertension and tachyarrythmia 12/27/22:  AKI slowly improving.  Start Precedex to assist with WUA. 12/28/22: MR Brain WO Contrast>no acute or reversible finding. Generalized age  related atrophy. Minimal small vessel change of the cerebral hemispheric white matter, less than often seen at this age. Chronic nonprogressive benign type  calcification within the posterolateral left thalamus. Sinus mucosal thickening and  layering fluid as often seen in patients with mechanical ventilation. Mastoid effusions as often seen in patients with mechanical ventilation.  12/29/22: Pt failed WUA  12/30/22: EEG negative for electrographic seizures, however severe diffuse slowing indicative of global cerebral dysfunction, medication effect, or both  12/30/22: No acute events overnight. Pt remains mechanically intubated FiO2 35%. Will perform WUA and possible SBT  01/01/23: Pt with continued poor neurological exam remains unresponsive despite not receiving sedating medications since 12/30/22.  CT Head and EEG negative.  Remains on  minimal mechanical ventilator settings, however neurological exam impeding extubation 01/02/23:   Neuro exam unchanged which is precluding extubation.  Consult Neurology,  IR to evaluate for LP.  Consult Palliative Care for  [place].  Code status changed to DNR.  HD catheter placed as family wishes to trial HD to see if would improve mental status. 01/03/23: Slightly more responsive this morning. ETT cuff with severe leak requiring exchange of ETT.  Plan for trial of HD.  Plan for LP with IR 01/04/23: Pt received 4 hrs of CRRT overnight.  First HD session today.  No acute events overnight.  Remains mechanically intubated  01/05/23: Pt is s/p 1st HD session neurological exam remains poor pt is still unable to follow commands, but opens eyes spontaneously.  He remains mechanically intubated on minimal vent settings  01/06/23- patient will likely need prolonged recovery at [facility] and need tracheostomy placement with PEG.  Interim History / Subjective:  As above under significant events   Objective   Blood pressure 133/72, pulse (!) 122, temperature 98.5 F (36.9 C), temperature source Oral, resp. rate (!) 28, height '5\' 4"'$  (1.626 m), weight 79.1 kg, SpO2 93 %.    Vent Mode: PSV;Spontaneous FiO2 (%):  [35 %] 35 % Set Rate:  [16 bmp] 16 bmp Vt Set:  [400 mL] 400 mL PEEP:  [5 cmH20] 5 cmH20 Pressure Support:  [10 OXB35-32 cmH20] 10 cmH20 Plateau Pressure:  [15 cmH20-22 cmH20] 22 cmH20   Intake/Output Summary (Last 24 hours) at 01/06/2023 0943 Last data filed at 01/06/2023 0726 Gross per 24 hour  Intake 1590 ml  Output 955 ml  Net 635 ml    Filed Weights   01/04/23 0300 01/05/23 0300 01/06/23 0300  Weight: 81 kg 79.5 kg 79.1 kg   Examination: General: Acutely on chronically-ill appearing male, mechanically ventilated.  In NAD HENT: Supple, difficult to assess for JVD due to body habitus, orally intubated Lungs: Coarse breath sounds throughout, no wheezing or rhonchi noted, even, non labored, occasionally overbreathes the vent Cardiovascular: Sinus tachycardia, s1s2, no r/g, 2+ radial/2+ distal, 2+ generalized edema  Abdomen: +BS x4, obese, soft, distended, rectal tube in place with stool present  Extremities: Normal  bulk and tone, no deformities Neuro: Off sedation, corneal and gag reflexes present, PERRL, opening eyes spontaneously but unable to follow commands, not withdrawing from painful stimulation in the BUE, withdrawing from painful stimulation of BLE GU: Indwelling foley catheter draining yellow urine  Resolved Hospital Problem list     Assessment & Plan:   Intubation and mechanical ventilation for airway protection in the setting of ETOH withdrawal  Right pneumothorax post intubation 01/03/23 s/p right-sided chest tube placement with resolution of pneumothorax Hx: OSA, COPD, Asthma, and Difficult Intubation  - Full vent support, implement lung protective strategies - Plateau pressures less than 30 cm H20 - Wean FiO2 & PEEP as tolerated to maintain O2 sats 88 to 92% - Follow intermittent Chest X-ray & ABG as needed - Spontaneous Breathing Trials when respiratory parameters met and mental status permits - Continue VAP Bundle - Bronchodilators & Pulmicort nebs - Will clamp chest tube today and obtain CXR 4 hrs post clamping   Hypertension Tachycardia Hx: Hypercholesteremia   Echo 12/31/22: EF 60 to 99%; grade I diastolic dysfunction  - Continuous cardiac monitoring - Resume outpatient propanolol at lower dose 20 mg daily; Prn hydralazine for bp management  Acute kidney injury secondary to ATN - Renal US negative - Trend BMP  - Replace electrolytes as indicated  -  Monitor UOP  - Avoid nephrotoxic medications - Nephrology following, appreciate input ~HD per recommendations   LLL pneumonia (ENTEROBACTER CLOACAE, STAPHYLOCOCCUS AUREUS) Fever - Monitor fever curve - Trend WBC's & Procalcitonin - Follow cultures as above - Completed abx course   Type II diabetes mellitus  - CBG's q4h; Target range of 140 to 180 - SSI and scheduled novolog  - Follow ICU Hypo/Hyperglycemia protocol  Possible seizure activity in the setting of ETOH withdrawal  Delirium Tremens Acute Metabolic  Encephalopathy  Sedation needs in setting of mechanical ventilation CT Head on admission: negative for acute intracranial abnormality EEG 12/23/22: suggestive of severe to profound diffuse encephalopathy, nonspecific etiology but likely related to sedation. No seizures or epileptiform discharges were seen throughout the recording.  MRI 1/3: with no acute or reversible finding. Chronic age related changes EEG 1/5: negative for seizure activity  - Treatment of metabolic derangements as outlined above - Maintain a RASS goal of 0  - Currently off sedation - Avoid sedating medications as able - Daily wake up assessment  - Seizure precautions  - Pt has been hospitalized for 2 weeks ETOH withdrawal symptoms have resolved  - Continue thiamine, folic acid, and MVI - Continue lactulose: goal 1L of stool daily  - LP performed 01/09 meningitis/encephalitis panel negative  - Neurology following, appreciate input - Trialing HD to help alleviate azotemia to determine if this will help neurological exam   Pt is critically ill.  Prognosis is guarded and overall long term prognosis is poor.  Pt is DNR status.  Palliative Care consulted to assist with [place]. Best Practice (right click and "Reselect all SmartList Selections" daily)   Diet/type: NPO, tube feeds DVT prophylaxis: Heparin SQ GI prophylaxis: PPI Lines: Left IJ Trialysis, and is still needed Foley:  Yes, and it is still needed Code Status:  DNR Last date of multidisciplinary goals of care discussion [01/05/23]  01/04/23: Will update pts family when they arrive at bedside  Labs   CBC: Recent Labs  Lab 01/02/23 0442 01/03/23 0507 01/04/23 0345 01/05/23 0425 01/06/23 0408  WBC 11.3* 13.4* 11.4* 12.1* 11.8*  NEUTROABS 9.6*  --   --   --   --   HGB 9.5* 9.3* 8.2* 7.5* 7.5*  HCT 29.9* 28.9* 26.2* 24.5* 24.8*  MCV 101.0* 100.7* 102.3* 105.6* 106.0*  PLT 336 389 387 354 369     Basic Metabolic Panel: Recent Labs  Lab 01/02/23 0442  01/03/23 0507 01/03/23 1659 01/04/23 0345 01/05/23 0422 01/05/23 0425 01/06/23 0408  NA 143 145 147* 146* 144  --  137  K 4.1 4.2 4.0 4.1 3.9  --  4.0  CL 102 106 106 108 106  --  97*  CO2 '28 26 29 28 28  '$ --  30  GLUCOSE 228* 210* 132* 182* 203*  --  180*  BUN 98* 96* 98* 91* 92*  --  49*  CREATININE 1.93* 1.92* 2.02* 1.63* 1.40*  --  0.79  CALCIUM 8.8* 8.9 9.0 8.5* 8.4*  --  8.1*  MG 2.4 2.4  --  2.2  --  1.9 1.7  PHOS 3.4 3.2 4.5 2.6 2.9  --  2.1*    GFR: Estimated Creatinine Clearance: 70 mL/min (by C-G formula based on SCr of 0.79 mg/dL). Recent Labs  Lab 12/31/22 0637 01/01/23 0600 01/03/23 0507 01/04/23 0345 01/05/23 0425 01/06/23 0408  PROCALCITON 1.63  --   --   --   --   --   WBC 13.5*   < >  13.4* 11.4* 12.1* 11.8*   < > = values in this interval not displayed.     Liver Function Tests: Recent Labs  Lab 01/01/23 0600 01/03/23 0507 01/03/23 1659 01/04/23 0345 01/05/23 0422 01/06/23 0408  AST 52*  --   --   --   --   --   ALT 33  --   --   --   --   --   ALKPHOS 106  --   --   --   --   --   BILITOT 1.0  --   --   --   --   --   PROT 6.7  --   --   --   --   --   ALBUMIN 1.8* 1.9* 1.7* 1.8* 1.7* 1.8*    No results for input(s): "LIPASE", "AMYLASE" in the last 168 hours. Recent Labs  Lab 01/01/23 1207  AMMONIA 57*     ABG    Component Value Date/Time   PHART 7.44 12/31/2022 0855   PCO2ART 45 12/31/2022 0855   PO2ART 64 (L) 12/31/2022 0855   HCO3 30.6 (H) 12/31/2022 0855   ACIDBASEDEF 3.3 (H) 12/29/2022 0206   O2SAT 94.2 12/31/2022 0855     Coagulation Profile: Recent Labs  Lab 01/01/23 1623  INR 1.3*     Cardiac Enzymes: No results for input(s): "CKTOTAL", "CKMB", "CKMBINDEX", "TROPONINI" in the last 168 hours.   HbA1C: Hgb A1c MFr Bld  Date/Time Value Ref Range Status  11/20/2022 05:04 AM 7.4 (H) 4.8 - 5.6 % Final    Comment:    (NOTE)         Prediabetes: 5.7 - 6.4         Diabetes: >6.4         Glycemic control for  adults with diabetes: <7.0   02/15/2022 05:57 AM 5.9 (H) 4.8 - 5.6 % Final    Comment:    (NOTE)         Prediabetes: 5.7 - 6.4         Diabetes: >6.4         Glycemic control for adults with diabetes: <7.0     CBG: Recent Labs  Lab 01/05/23 1618 01/05/23 1915 01/05/23 2320 01/06/23 0311 01/06/23 0719  GLUCAP 240* 153* 162* 158* 173*     Review of Systems:   Unable to assess pt mechanically intubated, AMS, critical illness  Past Medical History:  He,  has a past medical history of Anxiety, Arthritis, Asthma, Colon polyps, COPD (chronic obstructive pulmonary disease) ([place]), Depression, Diabetes ([place]), Difficult intubation, GERD (gastroesophageal reflux disease), High cholesterol, Hyperlipidemia, Hypertension, Hyponatremia (02/23/2022), and Sleep apnea.   Surgical History:   Past Surgical History:  Procedure Laterality Date   BRONCHOSCOPY     CARDIAC CATHETERIZATION Left 11/04/2016   Procedure: Left Heart Cath and Coronary Angiography;  Surgeon: Teodoro Spray, MD;  Location: [facility];  Service: Cardiovascular;  Laterality: Left;   CARDIAC CATHETERIZATION     CHOLECYSTECTOMY N/A 05/09/2019   Procedure: LAPAROSCOPIC CHOLECYSTECTOMY WITH INTRAOPERATIVE CHOLANGIOGRAM;  Surgeon: Jules Husbands, MD;  Location: [facility];  Service: General;  Laterality: N/A;   COLOSTOMY     IR FL GUIDED LOC OF NEEDLE/CATH TIP FOR SPINAL INJECTION RT  01/03/2023   IR KYPHO LUMBAR INC FX REDUCE BONE BX UNI/BIL CANNULATION INC/IMAGING  11/10/2022   IR RADIOLOGIST EVAL & MGMT  11/08/2022   LEFT HEART CATH AND CORONARY ANGIOGRAPHY N/A 12/14/2021   Procedure: LEFT  HEART CATH AND CORONARY ANGIOGRAPHY;  Surgeon: Corey Skains, MD;  Location: [facility];  Service: Cardiovascular;  Laterality: N/A;   ROTATOR CUFF REPAIR Left    THUMB ARTHROSCOPY  2015   TONSILLECTOMY  1952   TONSILLECTOMY       Social History:   reports that he has quit smoking. His smoking use included  cigarettes. He has a 15.00 pack-year smoking history. He has never used smokeless tobacco. He reports current alcohol use of about 1.0 - 2.0 standard drink of alcohol per week. He reports that he does not use drugs.   Family History:  His family history includes Heart attack in his mother; Leukemia in his brother; Prostate cancer in his brother and father.   Allergies Allergies  Allergen Reactions   Ace Inhibitors Cough   Metoprolol Diarrhea    Patient has been tolerating Propranolol 40 mg twice daily since at least 05/2022.   Nsaids Rash   Sulfa Antibiotics Rash     Home Medications  Prior to Admission medications   Medication Sig Start Date End Date Taking? Authorizing Provider  albuterol (ACCUNEB) 1.25 MG/3ML nebulizer solution Take 3 mLs by nebulization every 6 (six) hours as needed for wheezing. 11/20/22  Yes Wieting, Richard, MD  ALPRAZolam Duanne Moron) 0.5 MG tablet Take 0.5 mg by mouth daily as needed for anxiety. 10/06/22 10/06/23 Yes [provider]  aspirin EC 81 MG tablet Take 81 mg by mouth daily. Swallow whole.   Yes [provider]  diltiazem (CARDIZEM CD) 240 MG 24 hr capsule Take 240 mg by mouth every morning. 12/07/21  Yes [provider]  donepezil (ARICEPT) 5 MG tablet Take 5 mg by mouth at bedtime. 05/10/22  Yes [provider]  fluticasone (FLONASE) 50 MCG/ACT nasal spray Place 1 spray into both nostrils daily as needed for allergies or rhinitis.   Yes [provider]  folic acid (FOLVITE) 1 MG tablet Take 1 tablet (1 mg total) by mouth daily. 03/01/22  Yes Wieting, Richard, MD  gabapentin (NEURONTIN) 300 MG capsule Take 1 capsule (300 mg total) by mouth at bedtime. 02/28/22  Yes Wieting, Richard, MD  melatonin 5 MG TABS Take 1 tablet (5 mg total) by mouth at bedtime. 02/28/22  Yes Wieting, Richard, MD  nitroGLYCERIN (NITROSTAT) 0.4 MG SL tablet Place 0.4 mg under the tongue every 5 (five) minutes as needed for chest pain.   Yes  [provider]  omeprazole (PRILOSEC) 40 MG capsule Take 40 mg by mouth daily. 04/20/22  Yes [provider]  ondansetron (ZOFRAN-ODT) 4 MG disintegrating tablet Take 1 tablet (4 mg total) by mouth every 8 (eight) hours as needed for nausea or vomiting. 06/11/22  Yes Carrie Mew, MD  propranolol (INDERAL) 40 MG tablet Take 40 mg by mouth 2 (two) times daily. 11/07/22  Yes [provider]  QUEtiapine (SEROQUEL) 25 MG tablet One tab po qhs (can take extra dose if not sleeping) 02/28/22  Yes Wieting, Richard, MD  rosuvastatin (CRESTOR) 5 MG tablet Take 5 mg by mouth daily. 05/10/22  Yes [provider]  SYMBICORT 80-4.5 MCG/ACT inhaler Inhale 2 puffs into the lungs 2 (two) times daily as needed.  05/19/20  Yes [provider]  venlafaxine XR (EFFEXOR-XR) 37.5 MG 24 hr capsule Take 37.5 mg by mouth daily. 12/07/21  Yes [provider]  azithromycin (ZITHROMAX) 250 MG tablet One tab po daily for three days Patient not taking: Reported on 12/15/2022 11/21/22   Loletha Grayer,  MD  guaiFENesin (ROBITUSSIN) 100 MG/5ML liquid Take 5 mLs by mouth every 4 (four) hours as needed for cough or to loosen phlegm. Patient not taking: Reported on 12/05/2022 02/28/22   Loletha Grayer, MD  polyethylene glycol (MIRALAX / GLYCOLAX) 17 g packet Take 17 g by mouth daily as needed for moderate constipation. Patient not taking: Reported on 11/08/2022 02/28/22   Loletha Grayer, MD     Scheduled Meds:  vitamin C  500 mg Per Tube BID   Chlorhexidine Gluconate Cloth  6 each Topical Daily   docusate  100 mg Per Tube BID   folic acid  1 mg Per Tube Daily   free water  30 mL Per Tube Q4H   heparin injection (subcutaneous)  5,000 Units Subcutaneous Q8H   insulin aspart  0-20 Units Subcutaneous Q4H   insulin aspart  10 Units Subcutaneous Q4H   liver oil-zinc oxide   Topical QID   multivitamin  1 tablet Per Tube QHS   nutrition supplement (JUVEN)  1 packet Per Tube BID  BM   mouth rinse  15 mL Mouth Rinse Q2H   pantoprazole (PROTONIX) IV  40 mg Intravenous QHS   polyethylene glycol  17 g Per Tube Daily   propranolol  20 mg Per Tube Daily   thiamine  100 mg Per Tube Daily   venlafaxine  18.75 mg Per Tube BID WC   vitamin B-12  100 mcg Per Tube Daily   zinc sulfate  220 mg Per Tube Daily   Continuous Infusions:  sodium chloride     anticoagulant sodium citrate     feeding supplement (VITAL AF 1.2 CAL) 70 mL/hr at 01/06/23 0600   PRN Meds:.acetaminophen, albuterol, alteplase, anticoagulant sodium citrate, fentaNYL (SUBLIMAZE) injection, heparin, heparin sodium (porcine), hydrALAZINE, lidocaine (PF), lidocaine-prilocaine, mouth rinse, pentafluoroprop-tetrafluoroeth   Critical care provider statement:   Total critical care time: 33 minutes   Performed by: Lanney Gins MD   Critical care time was exclusive of separately billable procedures and treating other patients.   Critical care was necessary to treat or prevent imminent or life-threatening deterioration.   Critical care was time spent personally by me on the following activities: development of treatment plan with patient and/or surrogate as well as nursing, discussions with consultants, evaluation of patient's response to treatment, examination of patient, obtaining history from patient or surrogate, ordering and performing treatments and interventions, ordering and review of laboratory studies, ordering and review of radiographic studies, pulse oximetry and re-evaluation of patient's condition.    Ottie Glazier, M.D.  Pulmonary & Critical Care Medicine

## 2023-01-06 NOTE — Progress Notes (Signed)
Pt transported from ICU 6 to CT scan via vent and back to ICU 6 with out incident

## 2023-01-06 NOTE — Progress Notes (Signed)
Daily Progress Note   Patient Name: Brett Blake       Date: 01/06/2023 DOB: Sep 07, 1941  Age: 82 y.o. MRN#: [id number] Attending Physician: Ottie Glazier, MD Primary Care Physician: [name], MD Admit Date: 12/19/2022  Reason for Consultation/Follow-up: Establishing goals of care  Subjective: Notes and labs reviewed. In to see patient. He is currently having dialysis; no family at bedside. Per staff and previous conversation with family, family is looking to see if his status improves with sessions of dialysis. Will provide time for outcomes, I will follow up Tuesday on my return.   Length of Stay: 15  Current Medications: Scheduled Meds:   vitamin C  500 mg Per Tube BID   Chlorhexidine Gluconate Cloth  6 each Topical Daily   docusate  100 mg Per Tube BID   folic acid  1 mg Per Tube Daily   free water  30 mL Per Tube Q4H   heparin injection (subcutaneous)  5,000 Units Subcutaneous Q8H   heparin sodium (porcine)       insulin aspart  0-20 Units Subcutaneous Q4H   insulin aspart  10 Units Subcutaneous Q4H   liver oil-zinc oxide   Topical QID   multivitamin  1 tablet Per Tube QHS   nutrition supplement (JUVEN)  1 packet Per Tube BID BM   mouth rinse  15 mL Mouth Rinse Q2H   pantoprazole (PROTONIX) IV  40 mg Intravenous QHS   polyethylene glycol  17 g Per Tube Daily   propranolol  20 mg Per Tube Daily   thiamine  100 mg Per Tube Daily   venlafaxine  18.75 mg Per Tube BID WC   vitamin B-12  100 mcg Per Tube Daily   zinc sulfate  220 mg Per Tube Daily    Continuous Infusions:  sodium chloride     anticoagulant sodium citrate     feeding supplement (VITAL AF 1.2 CAL) 70 mL/hr at 01/06/23 1200    PRN Meds: acetaminophen, albuterol, alteplase, anticoagulant sodium  citrate, fentaNYL (SUBLIMAZE) injection, heparin, heparin sodium (porcine), heparin sodium (porcine), hydrALAZINE, lidocaine (PF), lidocaine-prilocaine, mouth rinse, pentafluoroprop-tetrafluoroeth  Physical Exam Constitutional:      Comments: Eyes closed.  Pulmonary:     Comments: On ventilator.             Vital  Signs: BP (!) 159/79 (BP Location: Left Leg)   Pulse (!) 106   Temp 98.5 F (36.9 C) (Oral)   Resp (!) 21   Ht '5\' 4"'$  (1.626 m)   Wt 79.1 kg   SpO2 97%   BMI 29.93 kg/m  SpO2: SpO2: 97 % O2 Device: O2 Device: Ventilator O2 Flow Rate:    Intake/output summary:  Intake/Output Summary (Last 24 hours) at 01/06/2023 1613 Last data filed at 01/06/2023 1200 Gross per 24 hour  Intake 1450 ml  Output 505 ml  Net 945 ml   LBM: Last BM Date : 01/06/23 Baseline Weight: Weight: 78 kg Most recent weight: Weight: 79.1 kg         Patient Active Problem List   Diagnosis Date Noted   Pneumothorax 01/03/2023   Acute renal failure ([place]) 01/01/2023   Acute hypoxic respiratory failure ([place]) 12/30/2022   Seizure-like activity ([place]) 12/19/2022   HLD (hyperlipidemia) 11/30/2022   Diabetes mellitus without complication ([place]) 42/59/5638   Dementia ([place]) 11/28/2022   Thrombocytopenia ([place]) 12/06/2022   Alcohol withdrawal ([place]) 12/20/2022   Acute encephalopathy 12/21/2022   COPD exacerbation ([place]) 11/18/2022   Anemia of chronic disease 02/27/2022   Hyponatremia 02/23/2022   Hypomagnesemia 02/21/2022   Hypokalemia 02/21/2022   Delirium tremens ([place]) 02/15/2022   COPD (chronic obstructive pulmonary disease) ([place]) 02/14/2022   GERD (gastroesophageal reflux disease) 02/14/2022   Cough 02/14/2022   Abnormal LFTs 02/14/2022   AMS (altered mental status) 02/14/2022   Unstable angina ([place]) 12/11/2021   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[place]) 11/17/2021   Sepsis ([place]) 05/20/2021   Acute gastroenteritis 05/20/2021   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  Will provide time for outcomes, I will follow-up Tuesday on my return.  Code Status:    Code Status Orders  (From admission, onward)           Start     Ordered   01/02/23 1802  Do not attempt resuscitation (DNR)  Continuous       Question Answer Comment  If patient has no pulse and is not breathing Do Not Attempt Resuscitation   If patient has a pulse and/or is breathing: Medical Treatment Goals MEDICAL INTERVENTIONS DESIRED: Use advanced airway interventions, mechanical ventilation or cardioversion in appropriate circumstances; Use medication/IV fluids as indicated; Provide comfort medications; Transfer to Progressive/Stepdown/ICU as indicated.   Consent: Discussion documented in EHR or advanced directives reviewed      01/02/23 1801           Code Status History     Date Active Date Inactive Code Status Order ID Comments User Context   12/25/2022 1512 01/02/2023 1801 Full Code [id number]  Ivor Costa, MD ED   11/18/2022 1953 11/20/2022 1736 Full Code [id number]  Vianne Bulls, MD ED   02/14/2022 1956 02/28/2022 2027 Full Code [id number]  Para Skeans, MD ED   12/11/2021 0413 12/14/2021 2046 Full Code [id number]  Mansy, Arvella Merles, MD ED  05/20/2021 0345 05/21/2021 1853 Full Code [id number]  Athena Masse, MD ED   07/16/2020 1607 07/17/2020 1411 Full Code [id number]  Collier Bullock, MD ED   05/08/2019 2127 05/11/2019 1649 Full Code [id number]  Jules Husbands,  MD ED   11/04/2016 0848 11/04/2016 1310 Full Code [id number]  Teodoro Spray, MD Inpatient   06/24/2015 1953 06/28/2015 1751 Full Code [id number]  Henreitta Leber, MD ED       Care plan was discussed with CCM staff  Thank you for allowing the Palliative Medicine Team to assist in the care of this patient.   Asencion Gowda, NP  Please contact Palliative Medicine Team phone at [phone] for questions and concerns.

## 2023-01-06 NOTE — Progress Notes (Addendum)
1400: temp: 100.1 pre hemodialysis. HD NP and ICU RN notified.   Tx started. Decreased blood flow rate to 350 and lines were reversed due to high AP alarms.  1605: Patient has irregular heart rhythm with multiple PACs on monitor. ICU RN aware. MD updated  1620: decreased blood flow rate to 200 per MD (Dr. Baker Janus order).  1633: still has irregular heart rhythm. MD ordered to terminate HD early. Rinsed back patient's blood as ordered. ICU RN notified.

## 2023-01-06 NOTE — TOC Progression Note (Signed)
Transition of Care [facility]) - Progression Note    Patient Details  Name: Brett Blake MRN: [id number] Date of Birth: [birth year]  Transition of [facility]) CM/SW Contact  Shelbie Hutching, RN Phone Number: 01/06/2023, 12:08 PM  Clinical Narrative:    Patient remains on the ventilator in the ICU, due to neurological status he cannot protect his airway if extubated.  Critical care MD to speak with family about tracheostomy.   Patient would be a candidate for [facility] after Trach.   TOC continues to follow.   Expected Discharge Plan:  (TBD) Barriers to Discharge: Continued Medical Work up  Expected Discharge Plan and Services       Living arrangements for the past 2 months: Single Family Home                 DME Arranged: N/A DME Agency: NA       HH Arranged: NA HH Agency: NA         Social Determinants of Health (SDOH) Interventions SDOH Screenings   Food Insecurity: No Food Insecurity (01/01/2023)  Transportation Needs: No Transportation Needs (01/01/2023)  Utilities: Not At Risk (01/01/2023)  Depression (PHQ2-9): Medium Risk (09/14/2020)  Tobacco Use: Medium Risk (01/03/2023)    Readmission Risk Interventions     No data to display

## 2023-01-06 NOTE — Progress Notes (Addendum)
[facility]  PROGRESS NOTE   Subjective:   Patient remains in ICU   Vent support, 35% FiO2 No sedation, responsive to name, unable to follow commands Tube feeds 26m/hr  Creatinine 0.79 BUN 49 UOP 1.125L in proceeding 24 hours   Objective:  Vital signs: Blood pressure 133/72, pulse (!) 122, temperature 98.5 F (36.9 C), temperature source Oral, resp. rate (!) 28, height '5\' 4"'$  (1.626 m), weight 79.1 kg, SpO2 93 %.  Intake/Output Summary (Last 24 hours) at 01/06/2023 1010 Last data filed at 01/06/2023 0726 Gross per 24 hour  Intake 1450 ml  Output 955 ml  Net 495 ml    Filed Weights   01/04/23 0300 01/05/23 0300 01/06/23 0300  Weight: 81 kg 79.5 kg 79.1 kg     Physical Exam: General: ill-appearing  Head:  Normocephalic, atraumatic.   Eyes:  Anicteric  Lungs:   Clear to auscultation, normal effort  Heart:  S1S2 no rubs  Abdomen:   Soft, nontender, bowel sounds present  Extremities:  1+ peripheral edema.  Neurologic:  Eyes open to name, not following commands.   Skin:  No lesions  Access: Left IJ temporary hemodialysis catheter    Basic Metabolic Panel: Recent Labs  Lab 01/02/23 0442 01/03/23 0507 01/03/23 1659 01/04/23 0345 01/05/23 0422 01/05/23 0425 01/06/23 0408  NA 143 145 147* 146* 144  --  137  K 4.1 4.2 4.0 4.1 3.9  --  4.0  CL 102 106 106 108 106  --  97*  CO2 '28 26 29 28 28  '$ --  30  GLUCOSE 228* 210* 132* 182* 203*  --  180*  BUN 98* 96* 98* 91* 92*  --  49*  CREATININE 1.93* 1.92* 2.02* 1.63* 1.40*  --  0.79  CALCIUM 8.8* 8.9 9.0 8.5* 8.4*  --  8.1*  MG 2.4 2.4  --  2.2  --  1.9 1.7  PHOS 3.4 3.2 4.5 2.6 2.9  --  2.1*    GFR: Estimated Creatinine Clearance: 70 mL/min (by C-G formula based on SCr of 0.79 mg/dL).  Liver Function Tests: Recent Labs  Lab 01/01/23 0600 01/03/23 0507 01/03/23 1659 01/04/23 0345 01/05/23 0422 01/06/23 0408  AST 52*  --   --   --   --   --   ALT 33  --   --   --   --   --   ALKPHOS 106  --    --   --   --   --   BILITOT 1.0  --   --   --   --   --   PROT 6.7  --   --   --   --   --   ALBUMIN 1.8* 1.9* 1.7* 1.8* 1.7* 1.8*    No results for input(s): "LIPASE", "AMYLASE" in the last 168 hours. Recent Labs  Lab 01/01/23 1207  AMMONIA 57*     CBC: Recent Labs  Lab 01/02/23 0442 01/03/23 0507 01/04/23 0345 01/05/23 0425 01/06/23 0408  WBC 11.3* 13.4* 11.4* 12.1* 11.8*  NEUTROABS 9.6*  --   --   --   --   HGB 9.5* 9.3* 8.2* 7.5* 7.5*  HCT 29.9* 28.9* 26.2* 24.5* 24.8*  MCV 101.0* 100.7* 102.3* 105.6* 106.0*  PLT 336 389 387 354 369      HbA1C: Hgb A1c MFr Bld  Date/Time Value Ref Range Status  11/20/2022 05:04 AM 7.4 (H) 4.8 - 5.6 % Final    Comment:    (  NOTE)         Prediabetes: 5.7 - 6.4         Diabetes: >6.4         Glycemic control for adults with diabetes: <7.0   02/15/2022 05:57 AM 5.9 (H) 4.8 - 5.6 % Final    Comment:    (NOTE)         Prediabetes: 5.7 - 6.4         Diabetes: >6.4         Glycemic control for adults with diabetes: <7.0     Urinalysis: No results for input(s): "COLORURINE", "LABSPEC", "PHURINE", "GLUCOSEU", "HGBUR", "BILIRUBINUR", "KETONESUR", "PROTEINUR", "UROBILINOGEN", "NITRITE", "LEUKOCYTESUR" in the last 72 hours.  Invalid input(s): "APPERANCEUR"    Imaging: DG Chest Port 1 View  Result Date: 01/06/2023 CLINICAL DATA:  3532 with respiratory failure. EXAM: PORTABLE CHEST 1 VIEW COMPARISON:  Portable chest yesterday at 2:30 p.m. FINDINGS: 4:45 a.m. ETT tip interval pullback to 6.4 cm from the carina, previously 3.7 cm. Double-lumen left IJ catheter again has its tip in the mid SVC with NGT entering the stomach with the intragastric course not fully filmed. The cardiomediastinal silhouette and vasculature are normal. Interval removal right basilar pigtail chest tube. There is no visible pneumothorax. Central bronchial thickening continues to be seen as well as patchy consolidation in the left lower lobe retrocardiac area.  The remainder of the lungs are clear. No new abnormality is seen. No interval worsening. IMPRESSION: 1. Interval removal of the right basilar pigtail chest tube. No visible pneumothorax. 2. Central bronchial thickening continues to be seen as well as patchy consolidation in the left lower lobe retrocardiac area. No interval worsening. Electronically Signed   By: Telford Nab M.D.   On: 01/06/2023 07:02   CT HEAD WO CONTRAST (5MM)  Result Date: 01/06/2023 CLINICAL DATA:  Altered mental status. EXAM: CT HEAD WITHOUT CONTRAST TECHNIQUE: Contiguous axial images were obtained from the base of the skull through the vertex without intravenous contrast. RADIATION DOSE REDUCTION: This exam was performed according to the departmental dose-optimization program which includes automated exposure control, adjustment of the mA and/or kV according to patient size and/or use of iterative reconstruction technique. COMPARISON:  January 01, 2023 FINDINGS: Brain: There is mild to moderate severity cerebral atrophy with widening of the extra-axial spaces and ventricular dilatation. There are areas of decreased attenuation within the white matter tracts of the supratentorial brain, consistent with microvascular disease changes. Vascular: No hyperdense vessel or unexpected calcification. Skull: Normal. Negative for fracture or focal lesion. Sinuses/Orbits: There is marked severity bilateral maxillary sinus, bilateral ethmoid sinus, sphenoid sinus and frontal sinus mucosal thickening. Other: None. IMPRESSION: 1. No acute intracranial abnormality. 2. Generalized cerebral atrophy with widening of the extra-axial spaces and ventricular dilatation. 3. Marked severity pansinus disease. Electronically Signed   By: Virgina Norfolk M.D.   On: 01/06/2023 01:38   DG Chest Port 1 View  Result Date: 01/05/2023 CLINICAL DATA:  [id number] Chest tube in place [id number] EXAM: PORTABLE CHEST 1 VIEW COMPARISON:  January 04, 2023 FINDINGS: The  cardiomediastinal silhouette is unchanged in contour.ETT tip terminates approximately 3.7 cm above the carina. The enteric tube courses through the chest to the abdomen beyond the field-of-view. LEFT neck CVC tip terminates over the confluence of the LEFT brachiocephalic and SVC. RIGHT-sided chest tube in similar position. No pleural effusion. No significant pneumothorax. Similar patchy perihilar predominant opacities with bronchial wall thickening. IMPRESSION: 1.  Support apparatus as described above. 2.  No significant  pneumothorax with RIGHT chest tube in place. Electronically Signed   By: Valentino Saxon M.D.   On: 01/05/2023 14:57   DG Chest Port 1 View  Result Date: 01/04/2023 CLINICAL DATA:  Follow-up right pneumothorax and right chest tube. EXAM: PORTABLE CHEST 1 VIEW COMPARISON:  01/03/2023 and older studies. FINDINGS: Right inferior hemithorax chest tube is stable.  No pneumothorax. Opacities in the perihilar and medial lung bases suspected to be atelectasis. No pulmonary edema. Stable well-positioned endotracheal tube, left internal jugular central venous line and nasal/orogastric tube. IMPRESSION: 1. No significant change from the most recent prior exam. 2. Stable inferior right hemithorax chest tube.  No pneumothorax. 3. Perihilar medial lung base opacities suspect to be atelectasis. Pneumonia possible. No pulmonary edema. Electronically Signed   By: Lajean Manes M.D.   On: 01/04/2023 10:20     Medications:    sodium chloride     anticoagulant sodium citrate     feeding supplement (VITAL AF 1.2 CAL) 70 mL/hr at 01/06/23 0600    vitamin C  500 mg Per Tube BID   Chlorhexidine Gluconate Cloth  6 each Topical Daily   docusate  100 mg Per Tube BID   folic acid  1 mg Per Tube Daily   free water  30 mL Per Tube Q4H   heparin injection (subcutaneous)  5,000 Units Subcutaneous Q8H   insulin aspart  0-20 Units Subcutaneous Q4H   insulin aspart  10 Units Subcutaneous Q4H   liver oil-zinc  oxide   Topical QID   multivitamin  1 tablet Per Tube QHS   nutrition supplement (JUVEN)  1 packet Per Tube BID BM   mouth rinse  15 mL Mouth Rinse Q2H   pantoprazole (PROTONIX) IV  40 mg Intravenous QHS   polyethylene glycol  17 g Per Tube Daily   propranolol  20 mg Per Tube Daily   thiamine  100 mg Per Tube Daily   venlafaxine  18.75 mg Per Tube BID WC   vitamin B-12  100 mcg Per Tube Daily   zinc sulfate  220 mg Per Tube Daily    Assessment/ Plan:     Mr. Jemell Town is a 82 y.o.  male with a PMHx of advanced COPD, asthma, diabetes mellitus type 2, GERD, hyperlipidemia, hypertension, obstructive sleep apnea, anxiety, who was admitted to [facility] on 12/18/2022 for evaluation of severe tremors.  Patient has history of alcohol intake nightly.  He developed significant delirium tremens upon admission and subsequently intubated for airway protection.    #1: Acute kidney injury: Patient with acute kidney injury most likely secondary to acute tubular necrosis due to hemodynamic compromise.  Renal sonogram was negative.   Creatinine within normal range, BUN improved. Urine output remains acceptable. Will provide third treatment today. Will all rest period over the weekend and assess renal function.   #2: Acute respiratory failure: Remains intubated with vent support   #3: Diabetes: Primary to manage sliding scale insulin   #4: Sepsis: Completed antibiotic therapy  #5. Genrealized Edema- Will order IV Furosemide '40mg'$  once to manage.    LOS: [facility] 1/12/202410:10 AM

## 2023-01-06 NOTE — Progress Notes (Signed)
Patient did 2.07 hours of HD. Had to terminate early due to irregular heart rhythm.  UF: 0    01/06/23 1636  Vitals  Temp 99.7 F (37.6 C)  Temp Source Axillary  BP (!) 160/82  MAP (mmHg) 101  BP Location Left Arm  BP Method Automatic  Patient Position (if appropriate) Lying  Pulse Rate (!) 105  Pulse Rate Source Monitor  ECG Heart Rate (!) 105  Resp (!) 21  Oxygen Therapy  SpO2 98 %  O2 Device Ventilator  Patient Activity (if Appropriate) In bed  Pulse Oximetry Type Continuous  Post Treatment  Dialyzer Clearance Lightly streaked  Duration of HD Treatment -hour(s) 2.07 hour(s)  Hemodialysis Intake (mL) 0 mL  Liters Processed 43.1  Fluid Removed (mL) 0 mL  Tolerated HD Treatment  (had to terminate HD tx eraly due to arrhythmia)  Hemodialysis Catheter Left Internal jugular Triple lumen Temporary (Non-Tunneled)  Placement Date: 01/02/23   Time Out: Correct patient;Correct site;Correct procedure  Maximum sterile barrier precautions: Hand hygiene;Cap;Mask;Sterile gown;Sterile gloves;Large sterile sheet  Site Prep: Chlorhexidine (preferred)  Verification by Antonieta Loveless...  Site Condition No complications  Blue Lumen Status Heparin locked  Red Lumen Status Heparin locked  Purple Lumen Status N/A  Catheter fill solution Heparin 1000 units/ml  Catheter fill volume (Arterial) 1.4 cc  Catheter fill volume (Venous) 1.4  Dressing Type Transparent  Dressing Status Antimicrobial disc in place  Drainage Description None  Dressing Change Due 01/11/23  Post treatment catheter status Capped and Clamped

## 2023-01-06 NOTE — Progress Notes (Signed)
Pre hd rn assessment 

## 2023-01-07 LAB — CBC
HCT: 24.3 % — ABNORMAL LOW (ref 39.0–52.0)
Hemoglobin: 7.3 g/dL — ABNORMAL LOW (ref 13.0–17.0)
MCH: 31.6 pg (ref 26.0–34.0)
MCHC: 30 g/dL (ref 30.0–36.0)
MCV: 105.2 fL — ABNORMAL HIGH (ref 80.0–100.0)
Platelets: 398 10*3/uL (ref 150–400)
RBC: 2.31 MIL/uL — ABNORMAL LOW (ref 4.22–5.81)
RDW: 17.4 % — ABNORMAL HIGH (ref 11.5–15.5)
WBC: 10.4 10*3/uL (ref 4.0–10.5)
nRBC: 1.7 % — ABNORMAL HIGH (ref 0.0–0.2)

## 2023-01-07 LAB — RENAL FUNCTION PANEL
Albumin: 2.1 g/dL — ABNORMAL LOW (ref 3.5–5.0)
Anion gap: 9 (ref 5–15)
BUN: 51 mg/dL — ABNORMAL HIGH (ref 8–23)
CO2: 29 mmol/L (ref 22–32)
Calcium: 8.5 mg/dL — ABNORMAL LOW (ref 8.9–10.3)
Chloride: 98 mmol/L (ref 98–111)
Creatinine, Ser: 0.93 mg/dL (ref 0.61–1.24)
GFR, Estimated: >60 mL/min (ref >60)
Glucose, Bld: 181 mg/dL — ABNORMAL HIGH (ref 70–99)
Phosphorus: 2.1 mg/dL — ABNORMAL LOW (ref 2.5–4.6)
Potassium: 4 mmol/L (ref 3.5–5.1)
Sodium: 136 mmol/L (ref 135–145)

## 2023-01-07 LAB — GLUCOSE, CAPILLARY
Glucose-Capillary: 181 mg/dL — ABNORMAL HIGH (ref 70–99)
Glucose-Capillary: 169 mg/dL — ABNORMAL HIGH (ref 70–99)
Glucose-Capillary: 110 mg/dL — ABNORMAL HIGH (ref 70–99)
Glucose-Capillary: 116 mg/dL — ABNORMAL HIGH (ref 70–99)
Glucose-Capillary: 137 mg/dL — ABNORMAL HIGH (ref 70–99)
Glucose-Capillary: 151 mg/dL — ABNORMAL HIGH (ref 70–99)

## 2023-01-07 LAB — MAGNESIUM: Magnesium: 1.7 mg/dL (ref 1.7–2.4)

## 2023-01-07 MED ORDER — BLISTEX MEDICATED EX OINT
TOPICAL_OINTMENT | CUTANEOUS | Status: DC | PRN
  Filled 2023-01-07: qty 6.3

## 2023-01-07 MED ORDER — POTASSIUM & SODIUM PHOSPHATES 280-160-250 MG PO PACK
2.0000 | PACK | Freq: Three times a day (TID) | ORAL | Status: AC
  Administered 2023-01-07: 2
  Filled 2023-01-07: qty 2

## 2023-01-07 NOTE — Progress Notes (Signed)
NAME:  Brett Blake, MRN:  [id number], DOB:  25-May-1941, LOS: 16 ADMISSION DATE:  12/07/2022, CONSULTATION DATE: 12/23/2022 REFERRING MD: Dr. Blaine Hamper, CHIEF COMPLAINT: AMS   History of Present Illness:  This is an 82 yo male with hx of ETOH Abuse who presented to [facility] ER on 12/28 via EMS from home following a possible seizure activity the morning of 12/28.  He has a hx of non epileptic seizures, however he does not take antiepileptics.  He reported he has not had a seizure in a long time.  All hx obtained from pts chart and daughter.   01/06/23- patient remains critically ill on ventilator and is now on day 15 of hospitalization.  He has not been able to be liberated from MV.  Plan to discuss trache/PEG.  Neurologist anticipates poor prognosis with long recovery at [facility] at best for patient.   01/07/23- patient with very heavy secretions, plan for bronchoscopy for theraputic aspiration of tracheobronchial tree.   He failed SBT today was not able to follow verbal communication.    Pertinent  Medical History  Anxiety  Arthritis  Asthma  Colon Polyps  COPD Depression  Type II Diabetes Mellitus Difficult Intubation  GERD  Hypercholesteremia  HLD HTN  Hyponatremia  OSA   Micro Data:  12/28: SARS-CoV-2 and influenza PCR>> negative 12/28: MRSA PCR>> negative 1/5: Tracheal aspirate>>ENTEROBACTER CLOACAE, STAPHYLOCOCCUS AUREUS  1/6: MRSA PCR>>negative  Antimicrobials:   Anti-infectives (From admission, onward)    Start     Dose/Rate Route Frequency Ordered Stop   12/30/22 2200  piperacillin-tazobactam (ZOSYN) IVPB 3.375 g        3.375 g 12.5 mL/hr over 240 Minutes Intravenous Every 8 hours 12/30/22 1902 01/04/23 0103       Significant Hospital Events: Including procedures, antibiotic start and stop dates in addition to other pertinent events   12/28: Pt admitted with possible seizure activity suspect secondary to severe ETOH withdrawal requiring mechanical intubation  12/28:  CT Head>No evidence of acute intracranial abnormality. Mild chronic small vessel ischemic disease.  12/23/22- patient still having seizures despite propofol, the seizures do respond to Ativan.  Pharmacy is monitoring electrolytes.  He is weaning off levophed.  He will start feeds today.  ABG with res alkalosis and RR was reduced.  12/24/22- patient improved, dcd phenobarb and wake up assessment was very poor today with onset of tachyarrythmia in 140s and seizure like activity upon reduction of sedation.  12/25/22-  patient is critically ill, he is not tolerating reduction of sedation with emergence of seizure like activity and tachyarrythmia. Wife and nephew at bedside we discussed short term plan. Today we have transitioned to herparin from lovenox due to worsening renal impairment.  12/26/22- patient was not able to tolerate reduction in sedation with devt of severe hypertension and tachyarrythmia 12/27/22:  AKI slowly improving.  Start Precedex to assist with WUA. 12/28/22: MR Brain WO Contrast>no acute or reversible finding. Generalized age  related atrophy. Minimal small vessel change of the cerebral hemispheric white matter, less than often seen at this age. Chronic nonprogressive benign type  calcification within the posterolateral left thalamus. Sinus mucosal thickening and  layering fluid as often seen in patients with mechanical ventilation. Mastoid effusions as often seen in patients with mechanical ventilation.  12/29/22: Pt failed WUA  12/30/22: EEG negative for electrographic seizures, however severe diffuse slowing indicative of global cerebral dysfunction, medication effect, or both  12/30/22: No acute events overnight. Pt remains mechanically intubated FiO2 35%. Will perform  WUA and possible SBT  01/01/23: Pt with continued poor neurological exam remains unresponsive despite not receiving sedating medications since 12/30/22.  CT Head and EEG negative.  Remains on minimal mechanical ventilator  settings, however neurological exam impeding extubation 01/02/23:   Neuro exam unchanged which is precluding extubation.  Consult Neurology,  IR to evaluate for LP.  Consult Palliative Care for [place].  Code status changed to DNR.  HD catheter placed as family wishes to trial HD to see if would improve mental status. 01/03/23: Slightly more responsive this morning. ETT cuff with severe leak requiring exchange of ETT.  Plan for trial of HD.  Plan for LP with IR 01/04/23: Pt received 4 hrs of CRRT overnight.  First HD session today.  No acute events overnight.  Remains mechanically intubated  01/05/23: Pt is s/p 1st HD session neurological exam remains poor pt is still unable to follow commands, but opens eyes spontaneously.  He remains mechanically intubated on minimal vent settings  01/06/23- patient will likely need prolonged recovery at [facility] and need tracheostomy placement with PEG.  Interim History / Subjective:  As above under significant events   Objective   Blood pressure 128/66, pulse 93, temperature 99.4 F (37.4 C), temperature source Oral, resp. rate (!) 21, height '5\' 4"'$  (1.626 m), weight 79.1 kg, SpO2 96 %.    Vent Mode: Spontaneous FiO2 (%):  [35 %] 35 % Set Rate:  [16 bmp] 16 bmp Vt Set:  [400 mL] 400 mL PEEP:  [5 cmH20] 5 cmH20 Pressure Support:  [10 cmH20] 10 cmH20 Plateau Pressure:  [20 cmH20-22 cmH20] 22 cmH20   Intake/Output Summary (Last 24 hours) at 01/07/2023 1211 Last data filed at 01/06/2023 2000 Gross per 24 hour  Intake 420 ml  Output 35 ml  Net 385 ml    Filed Weights   01/04/23 0300 01/05/23 0300 01/06/23 0300  Weight: 81 kg 79.5 kg 79.1 kg   Examination: General: Acutely on chronically-ill appearing male, mechanically ventilated.  In NAD HENT: Supple, difficult to assess for JVD due to body habitus, orally intubated Lungs: Coarse breath sounds throughout, no wheezing or rhonchi noted, even, non labored, occasionally overbreathes the vent Cardiovascular: Sinus  tachycardia, s1s2, no r/g, 2+ radial/2+ distal, 2+ generalized edema  Abdomen: +BS x4, obese, soft, distended, rectal tube in place with stool present  Extremities: Normal bulk and tone, no deformities Neuro: Off sedation, corneal and gag reflexes present, PERRL, opening eyes spontaneously but unable to follow commands, not withdrawing from painful stimulation in the BUE, withdrawing from painful stimulation of BLE GU: Indwelling foley catheter draining yellow urine  Resolved Hospital Problem list     Assessment & Plan:   Intubation and mechanical ventilation for airway protection in the setting of ETOH withdrawal  Right pneumothorax post intubation 01/03/23 s/p right-sided chest tube placement with resolution of pneumothorax Hx: OSA, COPD, Asthma, and Difficult Intubation  - Full vent support, implement lung protective strategies - Plateau pressures less than 30 cm H20 - Wean FiO2 & PEEP as tolerated to maintain O2 sats 88 to 92% - Follow intermittent Chest X-ray & ABG as needed - Spontaneous Breathing Trials when respiratory parameters met and mental status permits - Continue VAP Bundle - Bronchodilators & Pulmicort nebs - Will clamp chest tube today and obtain CXR 4 hrs post clamping   Hypertension Tachycardia Hx: Hypercholesteremia   Echo 12/31/22: EF 60 to 27%; grade I diastolic dysfunction  - Continuous cardiac monitoring - Resume outpatient propanolol at lower dose  20 mg daily; Prn hydralazine for bp management  Acute kidney injury secondary to ATN - Renal US negative - Trend BMP  - Replace electrolytes as indicated  - Monitor UOP  - Avoid nephrotoxic medications - Nephrology following, appreciate input ~HD per recommendations   LLL pneumonia (ENTEROBACTER CLOACAE, STAPHYLOCOCCUS AUREUS) Fever - Monitor fever curve - Trend WBC's & Procalcitonin - Follow cultures as above - Completed abx course   Type II diabetes mellitus  - CBG's q4h; Target range of 140 to 180 -  SSI and scheduled novolog  - Follow ICU Hypo/Hyperglycemia protocol  Possible seizure activity in the setting of ETOH withdrawal  Delirium Tremens Acute Metabolic Encephalopathy  Sedation needs in setting of mechanical ventilation CT Head on admission: negative for acute intracranial abnormality EEG 12/23/22: suggestive of severe to profound diffuse encephalopathy, nonspecific etiology but likely related to sedation. No seizures or epileptiform discharges were seen throughout the recording.  MRI 1/3: with no acute or reversible finding. Chronic age related changes EEG 1/5: negative for seizure activity  - Treatment of metabolic derangements as outlined above - Maintain a RASS goal of 0  - Currently off sedation - Avoid sedating medications as able - Daily wake up assessment  - Seizure precautions  - Pt has been hospitalized for 2 weeks ETOH withdrawal symptoms have resolved  - Continue thiamine, folic acid, and MVI - Continue lactulose: goal 1L of stool daily  - LP performed 01/09 meningitis/encephalitis panel negative  - Neurology following, appreciate input - Trialing HD to help alleviate azotemia to determine if this will help neurological exam   Pt is critically ill.  Prognosis is guarded and overall long term prognosis is poor.  Pt is DNR status.  Palliative Care consulted to assist with [place]. Best Practice (right click and "Reselect all SmartList Selections" daily)   Diet/type: NPO, tube feeds DVT prophylaxis: Heparin SQ GI prophylaxis: PPI Lines: Left IJ Trialysis, and is still needed Foley:  Yes, and it is still needed Code Status:  DNR Last date of multidisciplinary goals of care discussion [01/05/23]  01/04/23: Will update pts family when they arrive at bedside  Labs   CBC: Recent Labs  Lab 01/02/23 0442 01/03/23 0507 01/04/23 0345 01/05/23 0425 01/06/23 0408 01/07/23 0434  WBC 11.3* 13.4* 11.4* 12.1* 11.8* 10.4  NEUTROABS 9.6*  --   --   --   --   --   HGB  9.5* 9.3* 8.2* 7.5* 7.5* 7.3*  HCT 29.9* 28.9* 26.2* 24.5* 24.8* 24.3*  MCV 101.0* 100.7* 102.3* 105.6* 106.0* 105.2*  PLT 336 389 387 354 369 398     Basic Metabolic Panel: Recent Labs  Lab 01/03/23 0507 01/03/23 1659 01/04/23 0345 01/05/23 0422 01/05/23 0425 01/06/23 0408 01/07/23 0434  NA 145 147* 146* 144  --  137 136  K 4.2 4.0 4.1 3.9  --  4.0 4.0  CL 106 106 108 106  --  97* 98  CO2 '26 29 28 28  '$ --  30 29  GLUCOSE 210* 132* 182* 203*  --  180* 181*  BUN 96* 98* 91* 92*  --  49* 51*  CREATININE 1.92* 2.02* 1.63* 1.40*  --  0.79 0.93  CALCIUM 8.9 9.0 8.5* 8.4*  --  8.1* 8.5*  MG 2.4  --  2.2  --  1.9 1.7 1.7  PHOS 3.2 4.5 2.6 2.9  --  2.1* 2.1*    GFR: Estimated Creatinine Clearance: 60.2 mL/min (by C-G formula based on SCr of 0.93  mg/dL). Recent Labs  Lab 01/04/23 0345 01/05/23 0425 01/06/23 0408 01/07/23 0434  WBC 11.4* 12.1* 11.8* 10.4     Liver Function Tests: Recent Labs  Lab 01/01/23 0600 01/03/23 0507 01/03/23 1659 01/04/23 0345 01/05/23 0422 01/06/23 0408 01/07/23 0434  AST 52*  --   --   --   --   --   --   ALT 33  --   --   --   --   --   --   ALKPHOS 106  --   --   --   --   --   --   BILITOT 1.0  --   --   --   --   --   --   PROT 6.7  --   --   --   --   --   --   ALBUMIN 1.8*   < > 1.7* 1.8* 1.7* 1.8* 2.1*   < > = values in this interval not displayed.    No results for input(s): "LIPASE", "AMYLASE" in the last 168 hours. Recent Labs  Lab 01/01/23 1207  AMMONIA 57*     ABG    Component Value Date/Time   PHART 7.44 12/31/2022 0855   PCO2ART 45 12/31/2022 0855   PO2ART 64 (L) 12/31/2022 0855   HCO3 30.6 (H) 12/31/2022 0855   ACIDBASEDEF 3.3 (H) 12/29/2022 0206   O2SAT 94.2 12/31/2022 0855     Coagulation Profile: Recent Labs  Lab 01/01/23 1623  INR 1.3*     Cardiac Enzymes: No results for input(s): "CKTOTAL", "CKMB", "CKMBINDEX", "TROPONINI" in the last 168 hours.   HbA1C: Hgb A1c MFr Bld  Date/Time Value  Ref Range Status  11/20/2022 05:04 AM 7.4 (H) 4.8 - 5.6 % Final    Comment:    (NOTE)         Prediabetes: 5.7 - 6.4         Diabetes: >6.4         Glycemic control for adults with diabetes: <7.0   02/15/2022 05:57 AM 5.9 (H) 4.8 - 5.6 % Final    Comment:    (NOTE)         Prediabetes: 5.7 - 6.4         Diabetes: >6.4         Glycemic control for adults with diabetes: <7.0     CBG: Recent Labs  Lab 01/06/23 1920 01/06/23 2351 01/07/23 0323 01/07/23 0711 01/07/23 1123  GLUCAP 190* 236* 181* 169* 110*     Review of Systems:   Unable to assess pt mechanically intubated, AMS, critical illness  Past Medical History:  He,  has a past medical history of Anxiety, Arthritis, Asthma, Colon polyps, COPD (chronic obstructive pulmonary disease) ([place]), Depression, Diabetes ([place]), Difficult intubation, GERD (gastroesophageal reflux disease), High cholesterol, Hyperlipidemia, Hypertension, Hyponatremia (02/23/2022), and Sleep apnea.   Surgical History:   Past Surgical History:  Procedure Laterality Date   BRONCHOSCOPY     CARDIAC CATHETERIZATION Left 11/04/2016   Procedure: Left Heart Cath and Coronary Angiography;  Surgeon: Teodoro Spray, MD;  Location: [place] [facility];  Service: Cardiovascular;  Laterality: Left;   CARDIAC CATHETERIZATION     CHOLECYSTECTOMY N/A 05/09/2019   Procedure: LAPAROSCOPIC CHOLECYSTECTOMY WITH INTRAOPERATIVE CHOLANGIOGRAM;  Surgeon: Jules Husbands, MD;  Location: [facility];  Service: General;  Laterality: N/A;   COLOSTOMY     IR FL GUIDED LOC OF NEEDLE/CATH TIP FOR SPINAL INJECTION RT  01/03/2023  IR KYPHO LUMBAR INC FX REDUCE BONE BX UNI/BIL CANNULATION INC/IMAGING  11/10/2022   IR RADIOLOGIST EVAL & MGMT  11/08/2022   LEFT HEART CATH AND CORONARY ANGIOGRAPHY N/A 12/14/2021   Procedure: LEFT HEART CATH AND CORONARY ANGIOGRAPHY;  Surgeon: Corey Skains, MD;  Location: [place] [facility];  Service: Cardiovascular;  Laterality: N/A;   ROTATOR CUFF  REPAIR Left    THUMB ARTHROSCOPY  2015   TONSILLECTOMY  1952   TONSILLECTOMY       Social History:   reports that he has quit smoking. His smoking use included cigarettes. He has a 15.00 pack-year smoking history. He has never used smokeless tobacco. He reports current alcohol use of about 1.0 - 2.0 standard drink of alcohol per week. He reports that he does not use drugs.   Family History:  His family history includes Heart attack in his mother; Leukemia in his brother; Prostate cancer in his brother and father.   Allergies Allergies  Allergen Reactions   Ace Inhibitors Cough   Metoprolol Diarrhea    Patient has been tolerating Propranolol 40 mg twice daily since at least 05/2022.   Nsaids Rash   Sulfa Antibiotics Rash     Home Medications  Prior to Admission medications   Medication Sig Start Date End Date Taking? Authorizing Provider  albuterol (ACCUNEB) 1.25 MG/3ML nebulizer solution Take 3 mLs by nebulization every 6 (six) hours as needed for wheezing. 11/20/22  Yes Wieting, Richard, MD  ALPRAZolam Duanne Moron) 0.5 MG tablet Take 0.5 mg by mouth daily as needed for anxiety. 10/06/22 10/06/23 Yes [provider]  aspirin EC 81 MG tablet Take 81 mg by mouth daily. Swallow whole.   Yes [provider]  diltiazem (CARDIZEM CD) 240 MG 24 hr capsule Take 240 mg by mouth every morning. 12/07/21  Yes [provider]  donepezil (ARICEPT) 5 MG tablet Take 5 mg by mouth at bedtime. 05/10/22  Yes [provider]  fluticasone (FLONASE) 50 MCG/ACT nasal spray Place 1 spray into both nostrils daily as needed for allergies or rhinitis.   Yes [provider]  folic acid (FOLVITE) 1 MG tablet Take 1 tablet (1 mg total) by mouth daily. 03/01/22  Yes Wieting, Richard, MD  gabapentin (NEURONTIN) 300 MG capsule Take 1 capsule (300 mg total) by mouth at bedtime. 02/28/22  Yes Wieting, Richard, MD  melatonin 5 MG TABS Take 1 tablet (5 mg total) by mouth at bedtime.  02/28/22  Yes Wieting, Richard, MD  nitroGLYCERIN (NITROSTAT) 0.4 MG SL tablet Place 0.4 mg under the tongue every 5 (five) minutes as needed for chest pain.   Yes [provider]  omeprazole (PRILOSEC) 40 MG capsule Take 40 mg by mouth daily. 04/20/22  Yes [provider]  ondansetron (ZOFRAN-ODT) 4 MG disintegrating tablet Take 1 tablet (4 mg total) by mouth every 8 (eight) hours as needed for nausea or vomiting. 06/11/22  Yes Carrie Mew, MD  propranolol (INDERAL) 40 MG tablet Take 40 mg by mouth 2 (two) times daily. 11/07/22  Yes [provider]  QUEtiapine (SEROQUEL) 25 MG tablet One tab po qhs (can take extra dose if not sleeping) 02/28/22  Yes Wieting, Richard, MD  rosuvastatin (CRESTOR) 5 MG tablet Take 5 mg by mouth daily. 05/10/22  Yes [provider]  SYMBICORT 80-4.5 MCG/ACT inhaler Inhale 2 puffs into the lungs 2 (two) times daily as needed.  05/19/20  Yes [provider]  venlafaxine XR (EFFEXOR-XR) 37.5 MG 24 hr capsule  Take 37.5 mg by mouth daily. 12/07/21  Yes [provider]  azithromycin (ZITHROMAX) 250 MG tablet One tab po daily for three days Patient not taking: Reported on 12/19/2022 11/21/22   Loletha Grayer, MD  guaiFENesin (ROBITUSSIN) 100 MG/5ML liquid Take 5 mLs by mouth every 4 (four) hours as needed for cough or to loosen phlegm. Patient not taking: Reported on 12/19/2022 02/28/22   Loletha Grayer, MD  polyethylene glycol (MIRALAX / GLYCOLAX) 17 g packet Take 17 g by mouth daily as needed for moderate constipation. Patient not taking: Reported on 11/08/2022 02/28/22   Loletha Grayer, MD     Scheduled Meds:  vitamin C  500 mg Per Tube BID   Chlorhexidine Gluconate Cloth  6 each Topical Daily   docusate  100 mg Per Tube BID   folic acid  1 mg Per Tube Daily   free water  30 mL Per Tube Q4H   heparin injection (subcutaneous)  5,000 Units Subcutaneous Q8H   insulin aspart  0-20 Units Subcutaneous Q4H   insulin  aspart  10 Units Subcutaneous Q4H   liver oil-zinc oxide   Topical QID   multivitamin  1 tablet Per Tube QHS   nutrition supplement (JUVEN)  1 packet Per Tube BID BM   mouth rinse  15 mL Mouth Rinse Q2H   pantoprazole (PROTONIX) IV  40 mg Intravenous QHS   polyethylene glycol  17 g Per Tube Daily   propranolol  20 mg Per Tube Daily   thiamine  100 mg Per Tube Daily   venlafaxine  18.75 mg Per Tube BID WC   vitamin B-12  100 mcg Per Tube Daily   zinc sulfate  220 mg Per Tube Daily   Continuous Infusions:  sodium chloride     anticoagulant sodium citrate     feeding supplement (VITAL AF 1.2 CAL) Stopped (01/07/23 0830)   PRN Meds:.acetaminophen, albuterol, alteplase, anticoagulant sodium citrate, fentaNYL (SUBLIMAZE) injection, heparin, heparin sodium (porcine), hydrALAZINE, lidocaine (PF), lidocaine-prilocaine, mouth rinse, pentafluoroprop-tetrafluoroeth   Critical care provider statement:   Total critical care time: 33 minutes   Performed by: Lanney Gins MD   Critical care time was exclusive of separately billable procedures and treating other patients.   Critical care was necessary to treat or prevent imminent or life-threatening deterioration.   Critical care was time spent personally by me on the following activities: development of treatment plan with patient and/or surrogate as well as nursing, discussions with consultants, evaluation of patient's response to treatment, examination of patient, obtaining history from patient or surrogate, ordering and performing treatments and interventions, ordering and review of laboratory studies, ordering and review of radiographic studies, pulse oximetry and re-evaluation of patient's condition.    Ottie Glazier, M.D.  Pulmonary & Critical Care Medicine

## 2023-01-07 NOTE — Progress Notes (Signed)
[place] for Electrolyte Monitoring and Replacement   Recent Labs: Potassium (mmol/L)  Date Value  01/07/2023 4.0   Magnesium (mg/dL)  Date Value  01/07/2023 1.7   Calcium (mg/dL)  Date Value  01/07/2023 8.5 (L)   Albumin (g/dL)  Date Value  01/07/2023 2.1 (L)   Phosphorus (mg/dL)  Date Value  01/07/2023 2.1 (L)   Sodium (mmol/L)  Date Value  01/07/2023 136  Corrected Ca: 9.9 mg/dL  Assessment  Brett Blake is a 82 y.o. male presenting with seizure. PMH significant for AUD, HTN, HLD, DM, COPD, dementia, depression with anxiety, pseudoseizure, former smoker. Patient has been on HD since 1/9. Pharmacy has been consulted to monitor and replace electrolytes.  Diet: Vital 1.2 @ 70 mL/hr + FWF 30 mL per tube Q4H  Goal of Therapy: Electrolytes WNL  Plan:  Phosphorus: 2.9 >> 2.1, give PhosNAK 2 packets per tube x1 Check renal function panel, Mg with AM labs  Thank you for allowing pharmacy to be a part of this patient's care.  Gretel Acre, PharmD PGY1 Pharmacy Resident 01/07/2023 8:08 AM

## 2023-01-07 NOTE — Plan of Care (Signed)
Stopped by in the ICU to see the patient.  On no sedation. Easily opens eyes to voice.  Still does not follow commands. Grimaces to noxious stimulation appropriately. As mentioned in the last progress note, he will need correction of toxic metabolic derangements and time to show more recovery and only time will tell us how much and how fast is recovering. Plan is for tracheostomy/PEG tube for him while his toxic metabolic derangements are being addressed. Discussed with Dr. Lanney Gins  Neurology will be available as needed.  Please do not hesitate to call with questions.   -- Amie Portland, MD Neurologist [facility] Pager: [phone]   No charge note

## 2023-01-07 NOTE — Progress Notes (Addendum)
[facility]  PROGRESS NOTE   Subjective:   Patient remains critically ill in ICU  Vent support, 35% FiO2 No sedation, responsive to name, unable to follow commands Tube feeds 62m/hr  Creatinine 0.93 BUN 51   Objective:  Vital signs: Blood pressure 128/66, pulse 93, temperature 99.4 F (37.4 C), temperature source Oral, resp. rate (!) 21, height '5\' 4"'$  (1.626 m), weight 79.1 kg, SpO2 96 %.  Intake/Output Summary (Last 24 hours) at 01/07/2023 1018 Last data filed at 01/06/2023 2000 Gross per 24 hour  Intake 560 ml  Output 35 ml  Net 525 ml    Filed Weights   01/04/23 0300 01/05/23 0300 01/06/23 0300  Weight: 81 kg 79.5 kg 79.1 kg     Physical Exam: General: ill-appearing  Head:  Normocephalic, atraumatic.   Eyes:  Anicteric  Lungs:   Clear to auscultation, normal effort  Heart:  S1S2 no rubs  Abdomen:   Soft, nontender, bowel sounds present  Extremities:  trace peripheral edema.  Neurologic:  Eyes open to name, not following commands.   Skin:  No lesions  Access: Left IJ temporary hemodialysis catheter    Basic Metabolic Panel: Recent Labs  Lab 01/03/23 0507 01/03/23 1659 01/04/23 0345 01/05/23 0422 01/05/23 0425 01/06/23 0408 01/07/23 0434  NA 145 147* 146* 144  --  137 136  K 4.2 4.0 4.1 3.9  --  4.0 4.0  CL 106 106 108 106  --  97* 98  CO2 '26 29 28 28  '$ --  30 29  GLUCOSE 210* 132* 182* 203*  --  180* 181*  BUN 96* 98* 91* 92*  --  49* 51*  CREATININE 1.92* 2.02* 1.63* 1.40*  --  0.79 0.93  CALCIUM 8.9 9.0 8.5* 8.4*  --  8.1* 8.5*  MG 2.4  --  2.2  --  1.9 1.7 1.7  PHOS 3.2 4.5 2.6 2.9  --  2.1* 2.1*    GFR: Estimated Creatinine Clearance: 60.2 mL/min (by C-G formula based on SCr of 0.93 mg/dL).  Liver Function Tests: Recent Labs  Lab 01/01/23 0600 01/03/23 0507 01/03/23 1659 01/04/23 0345 01/05/23 0422 01/06/23 0408 01/07/23 0434  AST 52*  --   --   --   --   --   --   ALT 33  --   --   --   --   --   --   ALKPHOS 106  --    --   --   --   --   --   BILITOT 1.0  --   --   --   --   --   --   PROT 6.7  --   --   --   --   --   --   ALBUMIN 1.8*   < > 1.7* 1.8* 1.7* 1.8* 2.1*   < > = values in this interval not displayed.    No results for input(s): "LIPASE", "AMYLASE" in the last 168 hours. Recent Labs  Lab 01/01/23 1207  AMMONIA 57*     CBC: Recent Labs  Lab 01/02/23 0442 01/03/23 0507 01/04/23 0345 01/05/23 0425 01/06/23 0408 01/07/23 0434  WBC 11.3* 13.4* 11.4* 12.1* 11.8* 10.4  NEUTROABS 9.6*  --   --   --   --   --   HGB 9.5* 9.3* 8.2* 7.5* 7.5* 7.3*  HCT 29.9* 28.9* 26.2* 24.5* 24.8* 24.3*  MCV 101.0* 100.7* 102.3* 105.6* 106.0* 105.2*  PLT 336 389 387  354 369 398      HbA1C: Hgb A1c MFr Bld  Date/Time Value Ref Range Status  11/20/2022 05:04 AM 7.4 (H) 4.8 - 5.6 % Final    Comment:    (NOTE)         Prediabetes: 5.7 - 6.4         Diabetes: >6.4         Glycemic control for adults with diabetes: <7.0   02/15/2022 05:57 AM 5.9 (H) 4.8 - 5.6 % Final    Comment:    (NOTE)         Prediabetes: 5.7 - 6.4         Diabetes: >6.4         Glycemic control for adults with diabetes: <7.0     Urinalysis: No results for input(s): "COLORURINE", "LABSPEC", "PHURINE", "GLUCOSEU", "HGBUR", "BILIRUBINUR", "KETONESUR", "PROTEINUR", "UROBILINOGEN", "NITRITE", "LEUKOCYTESUR" in the last 72 hours.  Invalid input(s): "APPERANCEUR"    Imaging: DG Chest Port 1 View  Result Date: 01/06/2023 CLINICAL DATA:  8182 with respiratory failure. EXAM: PORTABLE CHEST 1 VIEW COMPARISON:  Portable chest yesterday at 2:30 p.m. FINDINGS: 4:45 a.m. ETT tip interval pullback to 6.4 cm from the carina, previously 3.7 cm. Double-lumen left IJ catheter again has its tip in the mid SVC with NGT entering the stomach with the intragastric course not fully filmed. The cardiomediastinal silhouette and vasculature are normal. Interval removal right basilar pigtail chest tube. There is no visible pneumothorax. Central  bronchial thickening continues to be seen as well as patchy consolidation in the left lower lobe retrocardiac area. The remainder of the lungs are clear. No new abnormality is seen. No interval worsening. IMPRESSION: 1. Interval removal of the right basilar pigtail chest tube. No visible pneumothorax. 2. Central bronchial thickening continues to be seen as well as patchy consolidation in the left lower lobe retrocardiac area. No interval worsening. Electronically Signed   By: Telford Nab M.D.   On: 01/06/2023 07:02   CT HEAD WO CONTRAST (5MM)  Result Date: 01/06/2023 CLINICAL DATA:  Altered mental status. EXAM: CT HEAD WITHOUT CONTRAST TECHNIQUE: Contiguous axial images were obtained from the base of the skull through the vertex without intravenous contrast. RADIATION DOSE REDUCTION: This exam was performed according to the departmental dose-optimization program which includes automated exposure control, adjustment of the mA and/or kV according to patient size and/or use of iterative reconstruction technique. COMPARISON:  January 01, 2023 FINDINGS: Brain: There is mild to moderate severity cerebral atrophy with widening of the extra-axial spaces and ventricular dilatation. There are areas of decreased attenuation within the white matter tracts of the supratentorial brain, consistent with microvascular disease changes. Vascular: No hyperdense vessel or unexpected calcification. Skull: Normal. Negative for fracture or focal lesion. Sinuses/Orbits: There is marked severity bilateral maxillary sinus, bilateral ethmoid sinus, sphenoid sinus and frontal sinus mucosal thickening. Other: None. IMPRESSION: 1. No acute intracranial abnormality. 2. Generalized cerebral atrophy with widening of the extra-axial spaces and ventricular dilatation. 3. Marked severity pansinus disease. Electronically Signed   By: Virgina Norfolk M.D.   On: 01/06/2023 01:38   DG Chest Port 1 View  Result Date: 01/05/2023 CLINICAL DATA:   [id number] Chest tube in place [id number] EXAM: PORTABLE CHEST 1 VIEW COMPARISON:  January 04, 2023 FINDINGS: The cardiomediastinal silhouette is unchanged in contour.ETT tip terminates approximately 3.7 cm above the carina. The enteric tube courses through the chest to the abdomen beyond the field-of-view. LEFT neck CVC tip terminates over the confluence of the  LEFT brachiocephalic and SVC. RIGHT-sided chest tube in similar position. No pleural effusion. No significant pneumothorax. Similar patchy perihilar predominant opacities with bronchial wall thickening. IMPRESSION: 1.  Support apparatus as described above. 2.  No significant pneumothorax with RIGHT chest tube in place. Electronically Signed   By: Valentino Saxon M.D.   On: 01/05/2023 14:57     Medications:    sodium chloride     anticoagulant sodium citrate     feeding supplement (VITAL AF 1.2 CAL) 70 mL/hr at 01/06/23 1800    vitamin C  500 mg Per Tube BID   Chlorhexidine Gluconate Cloth  6 each Topical Daily   docusate  100 mg Per Tube BID   folic acid  1 mg Per Tube Daily   free water  30 mL Per Tube Q4H   heparin injection (subcutaneous)  5,000 Units Subcutaneous Q8H   insulin aspart  0-20 Units Subcutaneous Q4H   insulin aspart  10 Units Subcutaneous Q4H   liver oil-zinc oxide   Topical QID   multivitamin  1 tablet Per Tube QHS   nutrition supplement (JUVEN)  1 packet Per Tube BID BM   mouth rinse  15 mL Mouth Rinse Q2H   pantoprazole (PROTONIX) IV  40 mg Intravenous QHS   polyethylene glycol  17 g Per Tube Daily   propranolol  20 mg Per Tube Daily   thiamine  100 mg Per Tube Daily   venlafaxine  18.75 mg Per Tube BID WC   vitamin B-12  100 mcg Per Tube Daily   zinc sulfate  220 mg Per Tube Daily    Assessment/ Plan:     [name] is a 82 y.o.  male with a PMHx of advanced COPD, asthma, diabetes mellitus type 2, GERD, hyperlipidemia, hypertension, obstructive sleep apnea, anxiety, who was admitted to [facility] on  12/02/2022 for evaluation of severe tremors.  Patient has history of alcohol intake nightly.  He developed significant delirium tremens upon admission and subsequently intubated for airway protection.    #1: Acute kidney injury: Patient with acute kidney injury most likely secondary to acute tubular necrosis due to hemodynamic compromise.  Renal sonogram was negative.   Received dialysis yesterday, terminated early due to irregular heart rate. No plans for dialysis today or tomorrow. Renal function within acceptable range.   #2: Acute respiratory failure: Remains intubated with vent support   #3: Diabetes: Primary to manage sliding scale insulin   #4: Sepsis: Completed antibiotic therapy  #5. Genrealized Edema- IV furosemide given yesterday, no urine output recorded. Edema improved.     LOS: [facility] 1/13/202410:18 AM

## 2023-01-08 ENCOUNTER — Inpatient Hospital Stay: Payer: Medicare Other

## 2023-01-08 LAB — HEMOGLOBIN AND HEMATOCRIT, BLOOD
HCT: 24.3 % — ABNORMAL LOW (ref 39.0–52.0)
Hemoglobin: 7.7 g/dL — ABNORMAL LOW (ref 13.0–17.0)

## 2023-01-08 LAB — RENAL FUNCTION PANEL
Albumin: 1.8 g/dL — ABNORMAL LOW (ref 3.5–5.0)
Anion gap: 7 (ref 5–15)
BUN: 54 mg/dL — ABNORMAL HIGH (ref 8–23)
CO2: 31 mmol/L (ref 22–32)
Calcium: 8.4 mg/dL — ABNORMAL LOW (ref 8.9–10.3)
Chloride: 99 mmol/L (ref 98–111)
Creatinine, Ser: 1 mg/dL (ref 0.61–1.24)
GFR, Estimated: >60 mL/min (ref >60)
Glucose, Bld: 161 mg/dL — ABNORMAL HIGH (ref 70–99)
Phosphorus: 3.9 mg/dL (ref 2.5–4.6)
Potassium: 4.1 mmol/L (ref 3.5–5.1)
Sodium: 137 mmol/L (ref 135–145)

## 2023-01-08 LAB — CBC
HCT: 21.6 % — ABNORMAL LOW (ref 39.0–52.0)
Hemoglobin: 6.5 g/dL — ABNORMAL LOW (ref 13.0–17.0)
MCH: 31.4 pg (ref 26.0–34.0)
MCHC: 30.1 g/dL (ref 30.0–36.0)
MCV: 104.3 fL — ABNORMAL HIGH (ref 80.0–100.0)
Platelets: 392 10*3/uL (ref 150–400)
RBC: 2.07 MIL/uL — ABNORMAL LOW (ref 4.22–5.81)
RDW: 17.5 % — ABNORMAL HIGH (ref 11.5–15.5)
WBC: 10.1 10*3/uL (ref 4.0–10.5)
nRBC: 0.5 % — ABNORMAL HIGH (ref 0.0–0.2)

## 2023-01-08 LAB — GLUCOSE, CAPILLARY
Glucose-Capillary: 130 mg/dL — ABNORMAL HIGH (ref 70–99)
Glucose-Capillary: 163 mg/dL — ABNORMAL HIGH (ref 70–99)
Glucose-Capillary: 164 mg/dL — ABNORMAL HIGH (ref 70–99)
Glucose-Capillary: 190 mg/dL — ABNORMAL HIGH (ref 70–99)
Glucose-Capillary: 203 mg/dL — ABNORMAL HIGH (ref 70–99)

## 2023-01-08 LAB — RETIC PANEL
Immature Retic Fract: 38.5 % — ABNORMAL HIGH (ref 2.3–15.9)
RBC.: 2.07 MIL/uL — ABNORMAL LOW (ref 4.22–5.81)
Retic Count, Absolute: 115.5 10*3/uL (ref 19.0–186.0)
Retic Ct Pct: 5.6 % — ABNORMAL HIGH (ref 0.4–3.1)
Reticulocyte Hemoglobin: 30.8 pg (ref >27.9)

## 2023-01-08 LAB — PREPARE RBC (CROSSMATCH)

## 2023-01-08 LAB — MAGNESIUM: Magnesium: 1.7 mg/dL (ref 1.7–2.4)

## 2023-01-08 LAB — ABO/RH: ABO/RH(D): A POS

## 2023-01-08 MED ORDER — HALOPERIDOL LACTATE 5 MG/ML IJ SOLN: 2.5000 mg | INTRAMUSCULAR | Status: DC | PRN

## 2023-01-08 MED ORDER — IPRATROPIUM-ALBUTEROL 0.5-2.5 (3) MG/3ML IN SOLN: 3.0000 mL | Freq: Four times a day (QID) | RESPIRATORY_TRACT | Status: DC

## 2023-01-08 MED ORDER — MIDAZOLAM HCL 2 MG/2ML IJ SOLN
2.0000 mg | INTRAMUSCULAR | Status: DC | PRN
  Administered 2023-01-08: 4 mg via INTRAVENOUS
  Filled 2023-01-08: qty 4

## 2023-01-08 MED ORDER — MIDAZOLAM HCL 2 MG/2ML IJ SOLN
INTRAMUSCULAR | Status: AC
  Filled 2023-01-08: qty 4

## 2023-01-08 MED ORDER — SODIUM CHLORIDE 0.9 % IV SOLN: INTRAVENOUS | Status: DC

## 2023-01-08 MED ORDER — MORPHINE BOLUS VIA INFUSION: 5.0000 mg | INTRAVENOUS | Status: DC | PRN

## 2023-01-08 MED ORDER — SODIUM CHLORIDE 0.9% IV SOLUTION: Freq: Once | INTRAVENOUS | Status: AC

## 2023-01-08 MED ORDER — ONDANSETRON 4 MG PO TBDP: 4.0000 mg | ORAL_TABLET | Freq: Four times a day (QID) | ORAL | Status: DC | PRN

## 2023-01-08 MED ORDER — ONDANSETRON HCL 4 MG/2ML IJ SOLN: 4.0000 mg | Freq: Four times a day (QID) | INTRAMUSCULAR | Status: DC | PRN

## 2023-01-08 MED ORDER — POLYVINYL ALCOHOL 1.4 % OP SOLN: 1.0000 [drp] | Freq: Four times a day (QID) | OPHTHALMIC | Status: DC | PRN

## 2023-01-08 MED ORDER — GLYCOPYRROLATE 0.2 MG/ML IJ SOLN: 0.2000 mg | INTRAMUSCULAR | Status: DC | PRN

## 2023-01-08 MED ORDER — GLYCOPYRROLATE 1 MG PO TABS: 1.0000 mg | ORAL_TABLET | ORAL | Status: DC | PRN

## 2023-01-08 MED ORDER — METHYLPREDNISOLONE SODIUM SUCC 40 MG IJ SOLR
40.0000 mg | INTRAMUSCULAR | Status: DC
  Administered 2023-01-08: 40 mg via INTRAVENOUS
  Filled 2023-01-08: qty 1

## 2023-01-08 MED ORDER — METOPROLOL TARTRATE 25 MG PO TABS
12.5000 mg | ORAL_TABLET | Freq: Two times a day (BID) | ORAL | Status: DC
  Administered 2023-01-08 (×2): 12.5 mg
  Filled 2023-01-08 (×2): qty 1

## 2023-01-08 MED ORDER — MORPHINE 100MG IN NS 100ML (1MG/ML) PREMIX INFUSION
0.0000 mg/h | INTRAVENOUS | Status: DC
  Administered 2023-01-08: 5 mg/h via INTRAVENOUS
  Administered 2023-01-09: 7 mg/h via INTRAVENOUS
  Filled 2023-01-08 (×2): qty 100

## 2023-01-08 MED ORDER — MIDAZOLAM HCL 2 MG/2ML IJ SOLN
4.0000 mg | Freq: Once | INTRAMUSCULAR | Status: AC
  Administered 2023-01-08: 2 mg via INTRAVENOUS

## 2023-01-08 MED ORDER — FENTANYL CITRATE PF 50 MCG/ML IJ SOSY: 50.0000 ug | PREFILLED_SYRINGE | INTRAMUSCULAR | Status: DC | PRN

## 2023-01-08 MED ORDER — SODIUM CHLORIDE 0.9 % IV SOLN
500.0000 mg | INTRAVENOUS | Status: DC
  Administered 2023-01-08: 500 mg via INTRAVENOUS
  Filled 2023-01-08: qty 500

## 2023-01-08 NOTE — Progress Notes (Signed)
[facility]  PROGRESS NOTE   Subjective:   Patient remains critically ill in ICU  Vent support, 35% FiO2 Sedation weaned, more alert Unable to follow commands  Creatinine 1.00 BUN 54 UOP 959m in previous 24 hours   Objective:  Vital signs: Blood pressure (!) 140/71, pulse (!) 112, temperature 99 F (37.2 C), temperature source Axillary, resp. rate (!) 24, height '5\' 4"'$  (1.626 m), weight 79.1 kg, SpO2 (!) 85 %.  Intake/Output Summary (Last 24 hours) at 01/08/2023 1036 Last data filed at 01/08/2023 0800 Gross per 24 hour  Intake 0 ml  Output 935 ml  Net -935 ml    Filed Weights   01/04/23 0300 01/05/23 0300 01/06/23 0300  Weight: 81 kg 79.5 kg 79.1 kg     Physical Exam: General: NAD, ill appearing  Head:  Normocephalic, atraumatic.   Eyes:  Anicteric  Lungs:   Vent assisted  Heart:  S1S2 no rubs  Abdomen:   Soft, nontender, bowel sounds present  Extremities:  1+ upper extremity peripheral edema.  Neurologic:  Eyes open to name, not following commands.   Skin:  No lesions  Access: Left IJ temporary hemodialysis catheter    Basic Metabolic Panel: Recent Labs  Lab 01/04/23 0345 01/05/23 0422 01/05/23 0425 01/06/23 0408 01/07/23 0434 01/08/23 0415  NA 146* 144  --  137 136 137  K 4.1 3.9  --  4.0 4.0 4.1  CL 108 106  --  97* 98 99  CO2 28 28  --  '30 29 31  '$ GLUCOSE 182* 203*  --  180* 181* 161*  BUN 91* 92*  --  49* 51* 54*  CREATININE 1.63* 1.40*  --  0.79 0.93 1.00  CALCIUM 8.5* 8.4*  --  8.1* 8.5* 8.4*  MG 2.2  --  1.9 1.7 1.7 1.7  PHOS 2.6 2.9  --  2.1* 2.1* 3.9    GFR: Estimated Creatinine Clearance: 56 mL/min (by C-G formula based on SCr of 1 mg/dL).  Liver Function Tests: Recent Labs  Lab 01/04/23 0345 01/05/23 0422 01/06/23 0408 01/07/23 0434 01/08/23 0415  ALBUMIN 1.8* 1.7* 1.8* 2.1* 1.8*    No results for input(s): "LIPASE", "AMYLASE" in the last 168 hours. Recent Labs  Lab 01/01/23 1207  AMMONIA 57*      CBC: Recent Labs  Lab 01/02/23 0442 01/03/23 0507 01/04/23 0345 01/05/23 0425 01/06/23 0408 01/07/23 0434 01/08/23 0415  WBC 11.3*   < > 11.4* 12.1* 11.8* 10.4 10.1  NEUTROABS 9.6*  --   --   --   --   --   --   HGB 9.5*   < > 8.2* 7.5* 7.5* 7.3* 6.5*  HCT 29.9*   < > 26.2* 24.5* 24.8* 24.3* 21.6*  MCV 101.0*   < > 102.3* 105.6* 106.0* 105.2* 104.3*  PLT 336   < > 387 354 369 398 392   < > = values in this interval not displayed.      HbA1C: Hgb A1c MFr Bld  Date/Time Value Ref Range Status  11/20/2022 05:04 AM 7.4 (H) 4.8 - 5.6 % Final    Comment:    (NOTE)         Prediabetes: 5.7 - 6.4         Diabetes: >6.4         Glycemic control for adults with diabetes: <7.0   02/15/2022 05:57 AM 5.9 (H) 4.8 - 5.6 % Final    Comment:    (NOTE)  Prediabetes: 5.7 - 6.4         Diabetes: >6.4         Glycemic control for adults with diabetes: <7.0     Urinalysis: No results for input(s): "COLORURINE", "LABSPEC", "PHURINE", "GLUCOSEU", "HGBUR", "BILIRUBINUR", "KETONESUR", "PROTEINUR", "UROBILINOGEN", "NITRITE", "LEUKOCYTESUR" in the last 72 hours.  Invalid input(s): "APPERANCEUR"    Imaging: No results found.   Medications:    sodium chloride     anticoagulant sodium citrate     feeding supplement (VITAL AF 1.2 CAL) Stopped (01/07/23 0830)    vitamin C  500 mg Per Tube BID   Chlorhexidine Gluconate Cloth  6 each Topical Daily   docusate  100 mg Per Tube BID   folic acid  1 mg Per Tube Daily   free water  30 mL Per Tube Q4H   heparin injection (subcutaneous)  5,000 Units Subcutaneous Q8H   insulin aspart  0-20 Units Subcutaneous Q4H   insulin aspart  10 Units Subcutaneous Q4H   liver oil-zinc oxide   Topical QID   metoprolol tartrate  12.5 mg Per Tube BID   multivitamin  1 tablet Per Tube QHS   nutrition supplement (JUVEN)  1 packet Per Tube BID BM   mouth rinse  15 mL Mouth Rinse Q2H   pantoprazole (PROTONIX) IV  40 mg Intravenous QHS    polyethylene glycol  17 g Per Tube Daily   propranolol  20 mg Per Tube Daily   thiamine  100 mg Per Tube Daily   venlafaxine  18.75 mg Per Tube BID WC   vitamin B-12  100 mcg Per Tube Daily   zinc sulfate  220 mg Per Tube Daily    Assessment/ Plan:     Brett Blake is a 82 y.o.  male with a PMHx of advanced COPD, asthma, diabetes mellitus type 2, GERD, hyperlipidemia, hypertension, obstructive sleep apnea, anxiety, who was admitted to [facility] on 12/17/2022 for evaluation of severe tremors.  Patient has history of alcohol intake nightly.  He developed significant delirium tremens upon admission and subsequently intubated for airway protection.    #1: Acute kidney injury: Patient with acute kidney injury most likely secondary to acute tubular necrosis due to hemodynamic compromise.  Renal sonogram was negative.   Renal function appears stable. Adequate urine output noted. No need for dialysis at this time. Will re-assess daily.   #2: Acute respiratory failure: Vent assisted   #3: Diabetes: Well-managed by primary team   #4: Sepsis: Completed antibiotic therapy  #5. Genrealized Edema-lower extremity edema improved, upper extremity edema remains.   LOS: [facility] 1/14/202410:36 AM

## 2023-01-08 NOTE — Procedures (Signed)
PROCEDURE: BRONCHOSCOPY Therapeutic Aspiration of Tracheobronchial Tree and BAL  PROCEDURE DATE: 01/08/2023  TIME:  NAME:  Brett Blake  DOB:1941-09-19  MRN: [id number] LOC:  IC06A/IC06A-AA    HOSP DAY: @LENGTHOFSTAYDAYS @ CODE STATUS:      Code Status Orders  (From admission, onward)           Start     Ordered   01/02/23 1802  Do not attempt resuscitation (DNR)  Continuous       Question Answer Comment  If patient has no pulse and is not breathing Do Not Attempt Resuscitation   If patient has a pulse and/or is breathing: Medical Treatment Goals MEDICAL INTERVENTIONS DESIRED: Use advanced airway interventions, mechanical ventilation or cardioversion in appropriate circumstances; Use medication/IV fluids as indicated; Provide comfort medications; Transfer to Progressive/Stepdown/ICU as indicated.   Consent: Discussion documented in EHR or advanced directives reviewed      01/02/23 1801           Code Status History     Date Active Date Inactive Code Status Order ID Comments User Context   12/25/2022 1512 01/02/2023 1801 Full Code [id number]  Ivor Costa, MD ED   11/18/2022 1953 11/20/2022 1736 Full Code [id number]  Vianne Bulls, MD ED   02/14/2022 1956 02/28/2022 2027 Full Code [id number]  Para Skeans, MD ED   12/11/2021 0413 12/14/2021 2046 Full Code [id number]  Mansy, Arvella Merles, MD ED   05/20/2021 0345 05/21/2021 1853 Full Code [id number]  Athena Masse, MD ED   07/16/2020 1607 07/17/2020 1411 Full Code [id number]  Collier Bullock, MD ED   05/08/2019 2127 05/11/2019 1649 Full Code [id number]  Jules Husbands, MD ED   11/04/2016 0848 11/04/2016 1310 Full Code [id number]  Teodoro Spray, MD Inpatient   06/24/2015 1953 06/28/2015 1751 Full Code [id number]  Henreitta Leber, MD ED           Indications/Preliminary Diagnosis:   Consent: (Place X beside choice/s below)  The benefits, risks and possible complications of the procedure were        explained to:  ___ patient  _x__  patient's family  ___ other:___________  who verbalized understanding and gave:  ___ verbal  __x_ written  ___ verbal and written  ___ telephone  ___ other:________ consent.      Unable to obtain consent; procedure performed on emergent basis.     Other:       PRESEDATION ASSESSMENT: History and Physical has been performed. Patient meds and allergies have been reviewed. Presedation airway examination has been performed and documented. Baseline vital signs, sedation score, oxygenation status, and cardiac rhythm were reviewed. Patient was deemed to be in satisfactory condition to undergo the procedure.    PREMEDICATIONS:   Sedative/Narcotic Amt Dose   Versed  mg   Fentanyl  mcg  Diprivan  mg            PROCEDURE DETAILS: Timeout performed and correct patient, name, & ID confirmed. Following prep per Pulmonary policy, appropriate sedation was administered. The Bronchoscope was inserted in to oral cavity with bite block in place. Therapeutic aspiration of Tracheobronchial tree was performed.  Airway exam proceeded with findings, technical procedures, and specimen collection as noted below. At the end of exam the scope was withdrawn without incident. Impression and Plan as noted below.           Airway Prep (Place X beside choice below)   1% Transtracheal Lidocaine Anesthetization 7 cc   Patient prepped  per Bronchoscopy Lab Policy       Insertion Route (Place X beside choice below)   Nasal   Oral  x Endotracheal Tube   Tracheostomy   INTRAPROCEDURE MEDICATIONS:  Sedative/Narcotic Amt Dose   Versed 2 mg   Fentanyl  mcg  Diprivan  mg       Medication Amt Dose  Medication Amt Dose  Lidocaine 1%  cc  Epinephrine 1:10,000 sol  cc  Xylocaine 4%  cc  Cocaine  cc   TECHNICAL PROCEDURES: (Place X beside choice below)   Procedures  Description    None     Electrocautery     Cryotherapy     Balloon Dilatation     Bronchography     Stent Placement   x  Therapeutic  Aspiration RLL and LLL    Laser/Argon Plasma    Brachytherapy Catheter Placement    Foreign Body Removal         SPECIMENS (Sites): (Place X beside choice below)  Specimens Description   No Specimens Obtained     Washings   x Lavage RLL   Biopsies    Fine Needle Aspirates    Brushings    Sputum    FINDINGS:  ESTIMATED BLOOD LOSS: none COMPLICATIONS/RESOLUTION: none      IMPRESSION:POST-PROCEDURE DX:   AIRWAYS DISPLAYED ERYTHEMA AND SWELLING WITH EDEMA, MUCUS PLUGGING WORSE AT RLL AND LLL.  MUCUS PLUGGING WAS ASPIRATED AND EVACUATED.  BAL WAS PERFORMED AT RLL.    RECOMMENDATION/PLAN:   Critical care provider statement:   Total critical care time: 33 minutes   Performed by: Lanney Gins MD   Critical care time was exclusive of separately billable procedures and treating other patients.   Critical care was necessary to treat or prevent imminent or life-threatening deterioration.   Critical care was time spent personally by me on the following activities: development of treatment plan with patient and/or surrogate as well as nursing, discussions with consultants, evaluation of patient's response to treatment, examination of patient, obtaining history from patient or surrogate, ordering and performing treatments and interventions, ordering and review of laboratory studies, ordering and review of radiographic studies, pulse oximetry and re-evaluation of patient's condition.    Ottie Glazier, M.D.  Pulmonary & Critical Care Medicine

## 2023-01-08 NOTE — IPAL (Signed)
Interdisciplinary Goals of Care Family Meeting     Date carried out: 01/08/2023 Location of the meeting: Telephone conference   Member's involved: NP and Family Member or next of kin; Brett Blake (wife), Brett Blake (Daughter)   Durable Power of Tour manager:    Discussion:  Advance Care Planning/Goals of Care discussion was performed during the course of treatment to decide on type of care right for this patient following admission to the ICU.   I returned a call from patient's family requesting to discuss goals of care in details. Family members were concerned that the patient is not making any progress and that his mental status has not improved as expected despite initiation of HD.  Family would not wish to keep patient on the ventilator for prolonged period of time. They stated clearly that patient would not wish to be trach dependent or live in a nursing home indefinitely. Patient's daughter asked about extubation to BiPAP but I informed her that this would be contraindicated given patient's mental status and inability to protect his airway. All questions were answered to the best of my ability.   Discussed overall prognosis, expected outcome with or without ongoing aggressive treatments and the options for de-escalation of care.   Diagnosis(es): Acute hypoxic hypercapnic respiratory failure secondary to pneumonia, ETOH withdrawal with DTs and seizures, AKI on HD, Severe Toxic Metabolic Encephalopathy,   Prognosis: Poor Code Status: DNR Disposition: ICU Next Steps:  Family understands the situation. They have consented and agreed to DNR/DNI and would not wish to pursue any aggressive treatment.  Patient's family would like to proceed with full comfort care including terminal extubation in the morning. Patient's daughter Brett Blake requested that I make his father comfortable tonight but keep him intubated until tomorrow morning when they will come to the bedside for  one way extubation.   I informed them to give me a call back if they change their minds or have any other question. Family are satisfied with Plan of action and management. All questions answered     Total Time Spent Face to Face addressing advance care planning in the presence of the Patient: 35 minutes       Rufina Falco, DNP, FNP-C, AGACNP-BC Acute Care Nurse Practitioner [facility] Pager: [phone] [place] [facility]

## 2023-01-08 NOTE — Progress Notes (Signed)
NAME:  Brett Blake, MRN:  [id number], DOB:  Sep 04, 1941, LOS: 75 ADMISSION DATE:  12/23/2022, CONSULTATION DATE: 12/14/2022 REFERRING MD: Dr. Blaine Hamper, CHIEF COMPLAINT: AMS   History of Present Illness:  This is an 82 yo male with hx of ETOH Abuse who presented to [facility] ER on 12/28 via EMS from home following a possible seizure activity the morning of 12/28.  He has a hx of non epileptic seizures, however he does not take antiepileptics.  He reported he has not had a seizure in a long time.  All hx obtained from pts chart and daughter.   01/06/23- patient remains critically ill on ventilator and is now on day 15 of hospitalization.  He has not been able to be liberated from MV.  Plan to discuss trache/PEG.  Neurologist anticipates poor prognosis with long recovery at [facility] at best for patient.   01/07/23- patient with very heavy secretions, plan for bronchoscopy for theraputic aspiration of tracheobronchial tree.   He failed SBT today was not able to follow verbal communication.  01/08/23- patient remains on MV , unable to follow verbal communication despite no sedation infusing.   Pertinent  Medical History  Anxiety  Arthritis  Asthma  Colon Polyps  COPD Depression  Type II Diabetes Mellitus Difficult Intubation  GERD  Hypercholesteremia  HLD HTN  Hyponatremia  OSA   Micro Data:  12/28: SARS-CoV-2 and influenza PCR>> negative 12/28: MRSA PCR>> negative 1/5: Tracheal aspirate>>ENTEROBACTER CLOACAE, STAPHYLOCOCCUS AUREUS  1/6: MRSA PCR>>negative  Antimicrobials:   Anti-infectives (From admission, onward)    Start     Dose/Rate Route Frequency Ordered Stop   12/30/22 2200  piperacillin-tazobactam (ZOSYN) IVPB 3.375 g        3.375 g 12.5 mL/hr over 240 Minutes Intravenous Every 8 hours 12/30/22 1902 01/04/23 0103       Significant Hospital Events: Including procedures, antibiotic start and stop dates in addition to other pertinent events   12/28: Pt admitted with possible  seizure activity suspect secondary to severe ETOH withdrawal requiring mechanical intubation  12/28: CT Head>No evidence of acute intracranial abnormality. Mild chronic small vessel ischemic disease.  12/23/22- patient still having seizures despite propofol, the seizures do respond to Ativan.  Pharmacy is monitoring electrolytes.  He is weaning off levophed.  He will start feeds today.  ABG with res alkalosis and RR was reduced.  12/24/22- patient improved, dcd phenobarb and wake up assessment was very poor today with onset of tachyarrythmia in 140s and seizure like activity upon reduction of sedation.  12/25/22-  patient is critically ill, he is not tolerating reduction of sedation with emergence of seizure like activity and tachyarrythmia. Wife and nephew at bedside we discussed short term plan. Today we have transitioned to herparin from lovenox due to worsening renal impairment.  12/26/22- patient was not able to tolerate reduction in sedation with devt of severe hypertension and tachyarrythmia 12/27/22:  AKI slowly improving.  Start Precedex to assist with WUA. 12/28/22: MR Brain WO Contrast>no acute or reversible finding. Generalized age  related atrophy. Minimal small vessel change of the cerebral hemispheric white matter, less than often seen at this age. Chronic nonprogressive benign type  calcification within the posterolateral left thalamus. Sinus mucosal thickening and  layering fluid as often seen in patients with mechanical ventilation. Mastoid effusions as often seen in patients with mechanical ventilation.  12/29/22: Pt failed WUA  12/30/22: EEG negative for electrographic seizures, however severe diffuse slowing indicative of global cerebral dysfunction, medication effect, or  both  12/30/22: No acute events overnight. Pt remains mechanically intubated FiO2 35%. Will perform WUA and possible SBT  01/01/23: Pt with continued poor neurological exam remains unresponsive despite not receiving sedating  medications since 12/30/22.  CT Head and EEG negative.  Remains on minimal mechanical ventilator settings, however neurological exam impeding extubation 01/02/23:   Neuro exam unchanged which is precluding extubation.  Consult Neurology,  IR to evaluate for LP.  Consult Palliative Care for [place].  Code status changed to DNR.  HD catheter placed as family wishes to trial HD to see if would improve mental status. 01/03/23: Slightly more responsive this morning. ETT cuff with severe leak requiring exchange of ETT.  Plan for trial of HD.  Plan for LP with IR 01/04/23: Pt received 4 hrs of CRRT overnight.  First HD session today.  No acute events overnight.  Remains mechanically intubated  01/05/23: Pt is s/p 1st HD session neurological exam remains poor pt is still unable to follow commands, but opens eyes spontaneously.  He remains mechanically intubated on minimal vent settings  01/06/23- patient will likely need prolonged recovery at [facility] and need tracheostomy placement with PEG.  Interim History / Subjective:  As above under significant events   Objective   Blood pressure (!) 148/79, pulse (!) 119, temperature 99 F (37.2 C), temperature source Axillary, resp. rate (!) 22, height '5\' 4"'$  (1.626 m), weight 79.1 kg, SpO2 98 %.    Vent Mode: Spontaneous FiO2 (%):  [35 %] 35 % Set Rate:  [16 bmp] 16 bmp Vt Set:  [400 mL] 400 mL PEEP:  [5 cmH20] 5 cmH20 Pressure Support:  [10 cmH20] 10 cmH20 Plateau Pressure:  [23 cmH20] 23 cmH20   Intake/Output Summary (Last 24 hours) at 01/08/2023 0945 Last data filed at 01/08/2023 0800 Gross per 24 hour  Intake 0 ml  Output 935 ml  Net -935 ml    Filed Weights   01/04/23 0300 01/05/23 0300 01/06/23 0300  Weight: 81 kg 79.5 kg 79.1 kg   Examination: General: Acutely on chronically-ill appearing male, mechanically ventilated.  In NAD HENT: Supple, difficult to assess for JVD due to body habitus, orally intubated Lungs: Coarse breath sounds throughout, no wheezing  or rhonchi noted, even, non labored, occasionally overbreathes the vent Cardiovascular: Sinus tachycardia, s1s2, no r/g, 2+ radial/2+ distal, 2+ generalized edema  Abdomen: +BS x4, obese, soft, distended, rectal tube in place with stool present  Extremities: Normal bulk and tone, no deformities Neuro: Off sedation, corneal and gag reflexes present, PERRL, opening eyes spontaneously but unable to follow commands, not withdrawing from painful stimulation in the BUE, withdrawing from painful stimulation of BLE GU: Indwelling foley catheter draining yellow urine  Resolved Hospital Problem list     Assessment & Plan:   Intubation and mechanical ventilation for airway protection in the setting of ETOH withdrawal  Right pneumothorax post intubation 01/03/23 s/p right-sided chest tube placement with resolution of pneumothorax Hx: OSA, COPD, Asthma, and Difficult Intubation  - Full vent support, implement lung protective strategies - Plateau pressures less than 30 cm H20 - Wean FiO2 & PEEP as tolerated to maintain O2 sats 88 to 92% - Follow intermittent Chest X-ray & ABG as needed - Spontaneous Breathing Trials when respiratory parameters met and mental status permits - Continue VAP Bundle - Bronchodilators & Pulmicort nebs - Will clamp chest tube today and obtain CXR 4 hrs post clamping   Hypertension Tachycardia Hx: Hypercholesteremia   Echo 12/31/22: EF 60 to 65%;  grade I diastolic dysfunction  - Continuous cardiac monitoring - Resume outpatient propanolol at lower dose 20 mg daily; Prn hydralazine for bp management  Acute kidney injury secondary to ATN - Renal US negative - Trend BMP  - Replace electrolytes as indicated  - Monitor UOP  - Avoid nephrotoxic medications - Nephrology following, appreciate input ~HD per recommendations   LLL pneumonia (ENTEROBACTER CLOACAE, STAPHYLOCOCCUS AUREUS) Fever - Monitor fever curve - Trend WBC's & Procalcitonin - Follow cultures as above -  Completed abx course   Type II diabetes mellitus  - CBG's q4h; Target range of 140 to 180 - SSI and scheduled novolog  - Follow ICU Hypo/Hyperglycemia protocol  Possible seizure activity in the setting of ETOH withdrawal  Delirium Tremens Acute Metabolic Encephalopathy  Sedation needs in setting of mechanical ventilation CT Head on admission: negative for acute intracranial abnormality EEG 12/23/22: suggestive of severe to profound diffuse encephalopathy, nonspecific etiology but likely related to sedation. No seizures or epileptiform discharges were seen throughout the recording.  MRI 1/3: with no acute or reversible finding. Chronic age related changes EEG 1/5: negative for seizure activity  - Treatment of metabolic derangements as outlined above - Maintain a RASS goal of 0  - Currently off sedation - Avoid sedating medications as able - Daily wake up assessment  - Seizure precautions  - Pt has been hospitalized for 2 weeks ETOH withdrawal symptoms have resolved  - Continue thiamine, folic acid, and MVI - Continue lactulose: goal 1L of stool daily  - LP performed 01/09 meningitis/encephalitis panel negative  - Neurology following, appreciate input - Trialing HD to help alleviate azotemia to determine if this will help neurological exam   Pt is critically ill.  Prognosis is guarded and overall long term prognosis is poor.  Pt is DNR status.  Palliative Care consulted to assist with [place]. Best Practice (right click and "Reselect all SmartList Selections" daily)   Diet/type: NPO, tube feeds DVT prophylaxis: Heparin SQ GI prophylaxis: PPI Lines: Left IJ Trialysis, and is still needed Foley:  Yes, and it is still needed Code Status:  DNR Last date of multidisciplinary goals of care discussion [01/05/23]  01/04/23: Will update pts family when they arrive at bedside  Labs   CBC: Recent Labs  Lab 01/02/23 0442 01/03/23 0507 01/04/23 0345 01/05/23 0425 01/06/23 0408  01/07/23 0434 01/08/23 0415  WBC 11.3*   < > 11.4* 12.1* 11.8* 10.4 10.1  NEUTROABS 9.6*  --   --   --   --   --   --   HGB 9.5*   < > 8.2* 7.5* 7.5* 7.3* 6.5*  HCT 29.9*   < > 26.2* 24.5* 24.8* 24.3* 21.6*  MCV 101.0*   < > 102.3* 105.6* 106.0* 105.2* 104.3*  PLT 336   < > 387 354 369 398 392   < > = values in this interval not displayed.     Basic Metabolic Panel: Recent Labs  Lab 01/04/23 0345 01/05/23 0422 01/05/23 0425 01/06/23 0408 01/07/23 0434 01/08/23 0415  NA 146* 144  --  137 136 137  K 4.1 3.9  --  4.0 4.0 4.1  CL 108 106  --  97* 98 99  CO2 28 28  --  '30 29 31  '$ GLUCOSE 182* 203*  --  180* 181* 161*  BUN 91* 92*  --  49* 51* 54*  CREATININE 1.63* 1.40*  --  0.79 0.93 1.00  CALCIUM 8.5* 8.4*  --  8.1* 8.5*  8.4*  MG 2.2  --  1.9 1.7 1.7 1.7  PHOS 2.6 2.9  --  2.1* 2.1* 3.9    GFR: Estimated Creatinine Clearance: 56 mL/min (by C-G formula based on SCr of 1 mg/dL). Recent Labs  Lab 01/05/23 0425 01/06/23 0408 01/07/23 0434 01/08/23 0415  WBC 12.1* 11.8* 10.4 10.1     Liver Function Tests: Recent Labs  Lab 01/04/23 0345 01/05/23 0422 01/06/23 0408 01/07/23 0434 01/08/23 0415  ALBUMIN 1.8* 1.7* 1.8* 2.1* 1.8*    No results for input(s): "LIPASE", "AMYLASE" in the last 168 hours. Recent Labs  Lab 01/01/23 1207  AMMONIA 57*     ABG    Component Value Date/Time   PHART 7.44 12/31/2022 0855   PCO2ART 45 12/31/2022 0855   PO2ART 64 (L) 12/31/2022 0855   HCO3 30.6 (H) 12/31/2022 0855   ACIDBASEDEF 3.3 (H) 12/29/2022 0206   O2SAT 94.2 12/31/2022 0855     Coagulation Profile: Recent Labs  Lab 01/01/23 1623  INR 1.3*     Cardiac Enzymes: No results for input(s): "CKTOTAL", "CKMB", "CKMBINDEX", "TROPONINI" in the last 168 hours.   HbA1C: Hgb A1c MFr Bld  Date/Time Value Ref Range Status  11/20/2022 05:04 AM 7.4 (H) 4.8 - 5.6 % Final    Comment:    (NOTE)         Prediabetes: 5.7 - 6.4         Diabetes: >6.4         Glycemic  control for adults with diabetes: <7.0   02/15/2022 05:57 AM 5.9 (H) 4.8 - 5.6 % Final    Comment:    (NOTE)         Prediabetes: 5.7 - 6.4         Diabetes: >6.4         Glycemic control for adults with diabetes: <7.0     CBG: Recent Labs  Lab 01/07/23 1534 01/07/23 1949 01/07/23 2312 01/08/23 0359 01/08/23 0734  GLUCAP 116* 137* 151* 130* 163*     Review of Systems:   Unable to assess pt mechanically intubated, AMS, critical illness  Past Medical History:  He,  has a past medical history of Anxiety, Arthritis, Asthma, Colon polyps, COPD (chronic obstructive pulmonary disease) ([place]), Depression, Diabetes ([place]), Difficult intubation, GERD (gastroesophageal reflux disease), High cholesterol, Hyperlipidemia, Hypertension, Hyponatremia (02/23/2022), and Sleep apnea.   Surgical History:   Past Surgical History:  Procedure Laterality Date   BRONCHOSCOPY     CARDIAC CATHETERIZATION Left 11/04/2016   Procedure: Left Heart Cath and Coronary Angiography;  Surgeon: Teodoro Spray, MD;  Location: [place] [facility];  Service: Cardiovascular;  Laterality: Left;   CARDIAC CATHETERIZATION     CHOLECYSTECTOMY N/A 05/09/2019   Procedure: LAPAROSCOPIC CHOLECYSTECTOMY WITH INTRAOPERATIVE CHOLANGIOGRAM;  Surgeon: Jules Husbands, MD;  Location: [facility] ORS;  Service: General;  Laterality: N/A;   COLOSTOMY     IR FL GUIDED LOC OF NEEDLE/CATH TIP FOR SPINAL INJECTION RT  01/03/2023   IR KYPHO LUMBAR INC FX REDUCE BONE BX UNI/BIL CANNULATION INC/IMAGING  11/10/2022   IR RADIOLOGIST EVAL & MGMT  11/08/2022   LEFT HEART CATH AND CORONARY ANGIOGRAPHY N/A 12/14/2021   Procedure: LEFT HEART CATH AND CORONARY ANGIOGRAPHY;  Surgeon: Corey Skains, MD;  Location: [place] [facility];  Service: Cardiovascular;  Laterality: N/A;   ROTATOR CUFF REPAIR Left    THUMB ARTHROSCOPY  2015   [place]   TONSILLECTOMY       Social  History:   reports that he has quit smoking. His smoking use  included cigarettes. He has a 15.00 pack-year smoking history. He has never used smokeless tobacco. He reports current alcohol use of about 1.0 - 2.0 standard drink of alcohol per week. He reports that he does not use drugs.   Family History:  His family history includes Heart attack in his mother; Leukemia in his brother; Prostate cancer in his brother and father.   Allergies Allergies  Allergen Reactions   Ace Inhibitors Cough   Metoprolol Diarrhea    Patient has been tolerating Propranolol 40 mg twice daily since at least 05/2022.   Nsaids Rash   Sulfa Antibiotics Rash     Home Medications  Prior to Admission medications   Medication Sig Start Date End Date Taking? Authorizing Provider  albuterol (ACCUNEB) 1.25 MG/3ML nebulizer solution Take 3 mLs by nebulization every 6 (six) hours as needed for wheezing. 11/20/22  Yes Wieting, Richard, MD  ALPRAZolam Duanne Moron) 0.5 MG tablet Take 0.5 mg by mouth daily as needed for anxiety. 10/06/22 10/06/23 Yes [provider]  aspirin EC 81 MG tablet Take 81 mg by mouth daily. Swallow whole.   Yes [provider]  diltiazem (CARDIZEM CD) 240 MG 24 hr capsule Take 240 mg by mouth every morning. 12/07/21  Yes [provider]  donepezil (ARICEPT) 5 MG tablet Take 5 mg by mouth at bedtime. 05/10/22  Yes [provider]  fluticasone (FLONASE) 50 MCG/ACT nasal spray Place 1 spray into both nostrils daily as needed for allergies or rhinitis.   Yes [provider]  folic acid (FOLVITE) 1 MG tablet Take 1 tablet (1 mg total) by mouth daily. 03/01/22  Yes Wieting, Richard, MD  gabapentin (NEURONTIN) 300 MG capsule Take 1 capsule (300 mg total) by mouth at bedtime. 02/28/22  Yes Wieting, Richard, MD  melatonin 5 MG TABS Take 1 tablet (5 mg total) by mouth at bedtime. 02/28/22  Yes Wieting, Richard, MD  nitroGLYCERIN (NITROSTAT) 0.4 MG SL tablet Place 0.4 mg under the tongue every 5 (five) minutes as needed for chest pain.    Yes [provider]  omeprazole (PRILOSEC) 40 MG capsule Take 40 mg by mouth daily. 04/20/22  Yes [provider]  ondansetron (ZOFRAN-ODT) 4 MG disintegrating tablet Take 1 tablet (4 mg total) by mouth every 8 (eight) hours as needed for nausea or vomiting. 06/11/22  Yes Carrie Mew, MD  propranolol (INDERAL) 40 MG tablet Take 40 mg by mouth 2 (two) times daily. 11/07/22  Yes [provider]  QUEtiapine (SEROQUEL) 25 MG tablet One tab po qhs (can take extra dose if not sleeping) 02/28/22  Yes Wieting, Richard, MD  rosuvastatin (CRESTOR) 5 MG tablet Take 5 mg by mouth daily. 05/10/22  Yes [provider]  SYMBICORT 80-4.5 MCG/ACT inhaler Inhale 2 puffs into the lungs 2 (two) times daily as needed.  05/19/20  Yes [provider]  venlafaxine XR (EFFEXOR-XR) 37.5 MG 24 hr capsule Take 37.5 mg by mouth daily. 12/07/21  Yes [provider]  azithromycin (ZITHROMAX) 250 MG tablet One tab po daily for three days Patient not taking: Reported on 12/16/2022 11/21/22   Loletha Grayer, MD  guaiFENesin (ROBITUSSIN) 100 MG/5ML liquid Take 5 mLs by mouth every 4 (four) hours as needed for cough or to loosen phlegm. Patient not taking: Reported on 11/26/2022 02/28/22   Loletha Grayer, MD  polyethylene glycol (MIRALAX / GLYCOLAX) 17 g packet Take 17 g by mouth daily  as needed for moderate constipation. Patient not taking: Reported on 11/08/2022 02/28/22   Loletha Grayer, MD     Scheduled Meds:  vitamin C  500 mg Per Tube BID   Chlorhexidine Gluconate Cloth  6 each Topical Daily   docusate  100 mg Per Tube BID   folic acid  1 mg Per Tube Daily   free water  30 mL Per Tube Q4H   heparin injection (subcutaneous)  5,000 Units Subcutaneous Q8H   insulin aspart  0-20 Units Subcutaneous Q4H   insulin aspart  10 Units Subcutaneous Q4H   liver oil-zinc oxide   Topical QID   multivitamin  1 tablet Per Tube QHS   nutrition supplement (JUVEN)  1 packet Per Tube  BID BM   mouth rinse  15 mL Mouth Rinse Q2H   pantoprazole (PROTONIX) IV  40 mg Intravenous QHS   polyethylene glycol  17 g Per Tube Daily   propranolol  20 mg Per Tube Daily   thiamine  100 mg Per Tube Daily   venlafaxine  18.75 mg Per Tube BID WC   vitamin B-12  100 mcg Per Tube Daily   zinc sulfate  220 mg Per Tube Daily   Continuous Infusions:  sodium chloride     anticoagulant sodium citrate     feeding supplement (VITAL AF 1.2 CAL) Stopped (01/07/23 0830)   PRN Meds:.acetaminophen, albuterol, alteplase, anticoagulant sodium citrate, fentaNYL (SUBLIMAZE) injection, heparin, heparin sodium (porcine), hydrALAZINE, lidocaine (PF), lidocaine-prilocaine, lip balm, mouth rinse, pentafluoroprop-tetrafluoroeth   Critical care provider statement:   Total critical care time: 33 minutes   Performed by: Lanney Gins MD   Critical care time was exclusive of separately billable procedures and treating other patients.   Critical care was necessary to treat or prevent imminent or life-threatening deterioration.   Critical care was time spent personally by me on the following activities: development of treatment plan with patient and/or surrogate as well as nursing, discussions with consultants, evaluation of patient's response to treatment, examination of patient, obtaining history from patient or surrogate, ordering and performing treatments and interventions, ordering and review of laboratory studies, ordering and review of radiographic studies, pulse oximetry and re-evaluation of patient's condition.    Ottie Glazier, M.D.  Pulmonary & Critical Care Medicine

## 2023-01-08 NOTE — Progress Notes (Addendum)
[place] for Electrolyte Monitoring and Replacement   Recent Labs: Potassium (mmol/L)  Date Value  01/08/2023 4.1   Magnesium (mg/dL)  Date Value  01/08/2023 1.7   Calcium (mg/dL)  Date Value  01/08/2023 8.4 (L)   Albumin (g/dL)  Date Value  01/08/2023 1.8 (L)   Phosphorus (mg/dL)  Date Value  01/08/2023 3.9   Sodium (mmol/L)  Date Value  01/08/2023 137  Corrected Ca: 9.9 mg/dL  Assessment  Brett Blake is a 82 y.o. male presenting with seizure. PMH significant for AUD, HTN, HLD, DM, COPD, dementia, depression with anxiety, pseudoseizure, former smoker. Patient starred HD on 1/9. Nephrology following and determining HD needs. Pharmacy has been consulted to monitor and replace electrolytes.  Diet: Vital 1.2 @ 70 mL/hr + FWF 30 mL per tube Q4H  Goal of Therapy: Electrolytes WNL  Plan:  Phosphorus: 2.1 >> 4.1, improved, continue to monitor Check renal function panel, Mg with AM labs  Thank you for allowing pharmacy to be a part of this patient's care.  Gretel Acre, PharmD PGY1 Pharmacy Resident 01/08/2023 7:43 AM

## 2023-01-09 LAB — TYPE AND SCREEN
ABO/RH(D): A POS
Antibody Screen: NEGATIVE
Unit division: 0

## 2023-01-09 LAB — BPAM RBC
Blood Product Expiration Date: 202402112359
ISSUE DATE / TIME: 202401141609
Unit Type and Rh: 6200

## 2023-01-10 LAB — MISC LABCORP TEST (SEND OUT): Labcorp test code: 505625

## 2023-01-11 LAB — CULTURE, RESPIRATORY W GRAM STAIN

## 2023-01-26 NOTE — Progress Notes (Signed)
Time of death 7,  Brett Blake to confirm.

## 2023-01-26 NOTE — TOC Progression Note (Signed)
Transition of Care [facility]) - Progression Note    Patient Details  Name: Valeriano Bain MRN: [id number] Date of Birth: Apr 06, 1941  Transition of [facility]) CM/SW Contact  Shelbie Hutching, RN Phone Number: Jan 28, 2023, 3:10 PM  Clinical Narrative:    Family has decided on comfort measures.   TOC will follow.    Expected Discharge Plan:  (TBD) Barriers to Discharge: Continued Medical Work up  Expected Discharge Plan and Services       Living arrangements for the past 2 months: Single Family Home                 DME Arranged: N/A DME Agency: NA       HH Arranged: NA HH Agency: NA         Social Determinants of Health (SDOH) Interventions SDOH Screenings   Food Insecurity: No Food Insecurity (01/01/2023)  Transportation Needs: No Transportation Needs (01/01/2023)  Utilities: Not At Risk (01/01/2023)  Depression (PHQ2-9): Medium Risk (09/14/2020)  Tobacco Use: Medium Risk (01/03/2023)    Readmission Risk Interventions     No data to display

## 2023-01-26 NOTE — Progress Notes (Signed)
NAME:  Brett Blake, MRN:  [id number], DOB:  Dec 10, 1941, LOS: 39 ADMISSION DATE:  12/06/2022, CONSULTATION DATE: 11/30/2022 REFERRING MD: Dr. Blaine Hamper, CHIEF COMPLAINT: AMS   History of Present Illness:  This is an 82 yo male with hx of ETOH Abuse who presented to [facility] ER on 12/28 via EMS from home following a possible seizure activity the morning of 12/28.  He has a hx of non epileptic seizures, however he does not take antiepileptics.  He reported he has not had a seizure in a long time.  All hx obtained from pts chart and daughter.   01/06/23- patient remains critically ill on ventilator and is now on day 15 of hospitalization.  He has not been able to be liberated from MV.  Plan to discuss trache/PEG.  Neurologist anticipates poor prognosis with long recovery at [facility] at best for patient.   01/07/23- patient with very heavy secretions, plan for bronchoscopy for theraputic aspiration of tracheobronchial tree.   He failed SBT today was not able to follow verbal communication.  01/08/23- patient remains on MV , unable to follow verbal communication despite no sedation infusing.  2023/01/21- patient is critically ill, family to come in today for goals of care with possible comfort measures.  Pertinent  Medical History  Anxiety  Arthritis  Asthma  Colon Polyps  COPD Depression  Type II Diabetes Mellitus Difficult Intubation  GERD  Hypercholesteremia  HLD HTN  Hyponatremia  OSA   Micro Data:  12/28: SARS-CoV-2 and influenza PCR>> negative 12/28: MRSA PCR>> negative 1/5: Tracheal aspirate>>ENTEROBACTER CLOACAE, STAPHYLOCOCCUS AUREUS  1/6: MRSA PCR>>negative  Antimicrobials:   Anti-infectives (From admission, onward)    Start     Dose/Rate Route Frequency Ordered Stop   01/08/23 1300  azithromycin (ZITHROMAX) 500 mg in sodium chloride 0.9 % 250 mL IVPB  Status:  Discontinued        500 mg 250 mL/hr over 60 Minutes Intravenous Every 24 hours 01/08/23 1204 01/08/23 2127   12/30/22  2200  piperacillin-tazobactam (ZOSYN) IVPB 3.375 g        3.375 g 12.5 mL/hr over 240 Minutes Intravenous Every 8 hours 12/30/22 1902 01/04/23 0103       Significant Hospital Events: Including procedures, antibiotic start and stop dates in addition to other pertinent events   12/28: Pt admitted with possible seizure activity suspect secondary to severe ETOH withdrawal requiring mechanical intubation  12/28: CT Head>No evidence of acute intracranial abnormality. Mild chronic small vessel ischemic disease.  12/23/22- patient still having seizures despite propofol, the seizures do respond to Ativan.  Pharmacy is monitoring electrolytes.  He is weaning off levophed.  He will start feeds today.  ABG with res alkalosis and RR was reduced.  12/24/22- patient improved, dcd phenobarb and wake up assessment was very poor today with onset of tachyarrythmia in 140s and seizure like activity upon reduction of sedation.  12/25/22-  patient is critically ill, he is not tolerating reduction of sedation with emergence of seizure like activity and tachyarrythmia. Wife and nephew at bedside we discussed short term plan. Today we have transitioned to herparin from lovenox due to worsening renal impairment.  12/26/22- patient was not able to tolerate reduction in sedation with devt of severe hypertension and tachyarrythmia 12/27/22:  AKI slowly improving.  Start Precedex to assist with WUA. 12/28/22: MR Brain WO Contrast>no acute or reversible finding. Generalized age  related atrophy. Minimal small vessel change of the cerebral hemispheric white matter, less than often seen at this  age. Chronic nonprogressive benign type  calcification within the posterolateral left thalamus. Sinus mucosal thickening and  layering fluid as often seen in patients with mechanical ventilation. Mastoid effusions as often seen in patients with mechanical ventilation.  12/29/22: Pt failed WUA  12/30/22: EEG negative for electrographic seizures,  however severe diffuse slowing indicative of global cerebral dysfunction, medication effect, or both  12/30/22: No acute events overnight. Pt remains mechanically intubated FiO2 35%. Will perform WUA and possible SBT  01/01/23: Pt with continued poor neurological exam remains unresponsive despite not receiving sedating medications since 12/30/22.  CT Head and EEG negative.  Remains on minimal mechanical ventilator settings, however neurological exam impeding extubation 01/02/23:   Neuro exam unchanged which is precluding extubation.  Consult Neurology,  IR to evaluate for LP.  Consult Palliative Care for [place].  Code status changed to DNR.  HD catheter placed as family wishes to trial HD to see if would improve mental status. 01/03/23: Slightly more responsive this morning. ETT cuff with severe leak requiring exchange of ETT.  Plan for trial of HD.  Plan for LP with IR 01/04/23: Pt received 4 hrs of CRRT overnight.  First HD session today.  No acute events overnight.  Remains mechanically intubated  01/05/23: Pt is s/p 1st HD session neurological exam remains poor pt is still unable to follow commands, but opens eyes spontaneously.  He remains mechanically intubated on minimal vent settings  01/06/23- patient will likely need prolonged recovery at [facility] and need tracheostomy placement with PEG.  Interim History / Subjective:  As above under significant events   Objective   Blood pressure 100/71, pulse 77, temperature (!) 97.1 F (36.2 C), resp. rate 16, height '5\' 4"'$  (1.626 m), weight 79.1 kg, SpO2 96 %.    Vent Mode: PRVC FiO2 (%):  [35 %-50 %] 35 % Set Rate:  [16 bmp] 16 bmp Vt Set:  [400 mL] 400 mL PEEP:  [5 cmH20-8 cmH20] 8 cmH20 Plateau Pressure:  [20 cmH20-22 cmH20] 20 cmH20   Intake/Output Summary (Last 24 hours) at 2023-02-01 0854 Last data filed at 02/01/2023 0700 Gross per 24 hour  Intake 1116.63 ml  Output 485 ml  Net 631.63 ml    Filed Weights   01/04/23 0300 01/05/23 0300 01/06/23 0300   Weight: 81 kg 79.5 kg 79.1 kg   Examination: General: Acutely on chronically-ill appearing male, mechanically ventilated.  In NAD HENT: Supple, difficult to assess for JVD due to body habitus, orally intubated Lungs: Coarse breath sounds throughout, no wheezing or rhonchi noted, even, non labored, occasionally overbreathes the vent Cardiovascular: Sinus tachycardia, s1s2, no r/g, 2+ radial/2+ distal, 2+ generalized edema  Abdomen: +BS x4, obese, soft, distended, rectal tube in place with stool present  Extremities: Normal bulk and tone, no deformities Neuro: Off sedation, corneal and gag reflexes present, PERRL, opening eyes spontaneously but unable to follow commands, not withdrawing from painful stimulation in the BUE, withdrawing from painful stimulation of BLE GU: Indwelling foley catheter draining yellow urine  Resolved Hospital Problem list     Assessment & Plan:   Intubation and mechanical ventilation for airway protection in the setting of ETOH withdrawal  Right pneumothorax post intubation 01/03/23 s/p right-sided chest tube placement with resolution of pneumothorax Hx: OSA, COPD, Asthma, and Difficult Intubation  - Full vent support, implement lung protective strategies - Plateau pressures less than 30 cm H20 - Wean FiO2 & PEEP as tolerated to maintain O2 sats 88 to 92% - Follow intermittent Chest X-ray &  ABG as needed - Spontaneous Breathing Trials when respiratory parameters met and mental status permits - Continue VAP Bundle - Bronchodilators & Pulmicort nebs - Will clamp chest tube today and obtain CXR 4 hrs post clamping   Hypertension Tachycardia Hx: Hypercholesteremia   Echo 12/31/22: EF 60 to 37%; grade I diastolic dysfunction  - Continuous cardiac monitoring - Resume outpatient propanolol at lower dose 20 mg daily; Prn hydralazine for bp management  Acute kidney injury secondary to ATN - Renal US negative - Trend BMP  - Replace electrolytes as indicated  -  Monitor UOP  - Avoid nephrotoxic medications - Nephrology following, appreciate input ~HD per recommendations   LLL pneumonia (ENTEROBACTER CLOACAE, STAPHYLOCOCCUS AUREUS) Fever - Monitor fever curve - Trend WBC's & Procalcitonin - Follow cultures as above - Completed abx course   Type II diabetes mellitus  - CBG's q4h; Target range of 140 to 180 - SSI and scheduled novolog  - Follow ICU Hypo/Hyperglycemia protocol  Possible seizure activity in the setting of ETOH withdrawal  Delirium Tremens Acute Metabolic Encephalopathy  Sedation needs in setting of mechanical ventilation CT Head on admission: negative for acute intracranial abnormality EEG 12/23/22: suggestive of severe to profound diffuse encephalopathy, nonspecific etiology but likely related to sedation. No seizures or epileptiform discharges were seen throughout the recording.  MRI 1/3: with no acute or reversible finding. Chronic age related changes EEG 1/5: negative for seizure activity  - Treatment of metabolic derangements as outlined above - Maintain a RASS goal of 0  - Currently off sedation - Avoid sedating medications as able - Daily wake up assessment  - Seizure precautions  - Pt has been hospitalized for 2 weeks ETOH withdrawal symptoms have resolved  - Continue thiamine, folic acid, and MVI - Continue lactulose: goal 1L of stool daily  - LP performed 01/09 meningitis/encephalitis panel negative  - Neurology following, appreciate input - Trialing HD to help alleviate azotemia to determine if this will help neurological exam   Pt is critically ill.  Prognosis is guarded and overall long term prognosis is poor.  Pt is DNR status.  Palliative Care consulted to assist with [place]. Best Practice (right click and "Reselect all SmartList Selections" daily)   Diet/type: NPO, tube feeds DVT prophylaxis: Heparin SQ GI prophylaxis: PPI Lines: Left IJ Trialysis, and is still needed Foley:  Yes, and it is still  needed Code Status:  DNR Last date of multidisciplinary goals of care discussion [01/05/23]  01/04/23: Will update pts family when they arrive at bedside  Labs   CBC: Recent Labs  Lab 01/04/23 0345 01/05/23 0425 01/06/23 0408 01/07/23 0434 01/08/23 0415 01/08/23 2007  WBC 11.4* 12.1* 11.8* 10.4 10.1  --   HGB 8.2* 7.5* 7.5* 7.3* 6.5* 7.7*  HCT 26.2* 24.5* 24.8* 24.3* 21.6* 24.3*  MCV 102.3* 105.6* 106.0* 105.2* 104.3*  --   PLT 387 354 369 398 392  --      Basic Metabolic Panel: Recent Labs  Lab 01/04/23 0345 01/05/23 0422 01/05/23 0425 01/06/23 0408 01/07/23 0434 01/08/23 0415  NA 146* 144  --  137 136 137  K 4.1 3.9  --  4.0 4.0 4.1  CL 108 106  --  97* 98 99  CO2 28 28  --  '30 29 31  '$ GLUCOSE 182* 203*  --  180* 181* 161*  BUN 91* 92*  --  49* 51* 54*  CREATININE 1.63* 1.40*  --  0.79 0.93 1.00  CALCIUM 8.5* 8.4*  --  8.1* 8.5* 8.4*  MG 2.2  --  1.9 1.7 1.7 1.7  PHOS 2.6 2.9  --  2.1* 2.1* 3.9    GFR: Estimated Creatinine Clearance: 56 mL/min (by C-G formula based on SCr of 1 mg/dL). Recent Labs  Lab 01/05/23 0425 01/06/23 0408 01/07/23 0434 01/08/23 0415  WBC 12.1* 11.8* 10.4 10.1     Liver Function Tests: Recent Labs  Lab 01/04/23 0345 01/05/23 0422 01/06/23 0408 01/07/23 0434 01/08/23 0415  ALBUMIN 1.8* 1.7* 1.8* 2.1* 1.8*    No results for input(s): "LIPASE", "AMYLASE" in the last 168 hours. No results for input(s): "AMMONIA" in the last 168 hours.   ABG    Component Value Date/Time   PHART 7.44 12/31/2022 0855   PCO2ART 45 12/31/2022 0855   PO2ART 64 (L) 12/31/2022 0855   HCO3 30.6 (H) 12/31/2022 0855   ACIDBASEDEF 3.3 (H) 12/29/2022 0206   O2SAT 94.2 12/31/2022 0855     Coagulation Profile: No results for input(s): "INR", "PROTIME" in the last 168 hours.   Cardiac Enzymes: No results for input(s): "CKTOTAL", "CKMB", "CKMBINDEX", "TROPONINI" in the last 168 hours.   HbA1C: Hgb A1c MFr Bld  Date/Time Value Ref Range  Status  11/20/2022 05:04 AM 7.4 (H) 4.8 - 5.6 % Final    Comment:    (NOTE)         Prediabetes: 5.7 - 6.4         Diabetes: >6.4         Glycemic control for adults with diabetes: <7.0   02/15/2022 05:57 AM 5.9 (H) 4.8 - 5.6 % Final    Comment:    (NOTE)         Prediabetes: 5.7 - 6.4         Diabetes: >6.4         Glycemic control for adults with diabetes: <7.0     CBG: Recent Labs  Lab 01/08/23 0359 01/08/23 0734 01/08/23 1143 01/08/23 1540 01/08/23 2004  GLUCAP 130* 163* 164* 190* 203*     Review of Systems:   Unable to assess pt mechanically intubated, AMS, critical illness  Past Medical History:  He,  has a past medical history of Anxiety, Arthritis, Asthma, Colon polyps, COPD (chronic obstructive pulmonary disease) ([place]), Depression, Diabetes ([place]), Difficult intubation, GERD (gastroesophageal reflux disease), High cholesterol, Hyperlipidemia, Hypertension, Hyponatremia (02/23/2022), and Sleep apnea.   Surgical History:   Past Surgical History:  Procedure Laterality Date   BRONCHOSCOPY     CARDIAC CATHETERIZATION Left 11/04/2016   Procedure: Left Heart Cath and Coronary Angiography;  Surgeon: Teodoro Spray, MD;  Location: [place] [facility];  Service: Cardiovascular;  Laterality: Left;   CARDIAC CATHETERIZATION     CHOLECYSTECTOMY N/A 05/09/2019   Procedure: LAPAROSCOPIC CHOLECYSTECTOMY WITH INTRAOPERATIVE CHOLANGIOGRAM;  Surgeon: Jules Husbands, MD;  Location: [facility];  Service: General;  Laterality: N/A;   COLOSTOMY     IR FL GUIDED LOC OF NEEDLE/CATH TIP FOR SPINAL INJECTION RT  01/03/2023   IR KYPHO LUMBAR INC FX REDUCE BONE BX UNI/BIL CANNULATION INC/IMAGING  11/10/2022   IR RADIOLOGIST EVAL & MGMT  11/08/2022   LEFT HEART CATH AND CORONARY ANGIOGRAPHY N/A 12/14/2021   Procedure: LEFT HEART CATH AND CORONARY ANGIOGRAPHY;  Surgeon: Corey Skains, MD;  Location: [place] [facility];  Service: Cardiovascular;  Laterality: N/A;   ROTATOR CUFF REPAIR Left     THUMB ARTHROSCOPY  2015   TONSILLECTOMY  1952   TONSILLECTOMY  Social History:   reports that he has quit smoking. His smoking use included cigarettes. He has a 15.00 pack-year smoking history. He has never used smokeless tobacco. He reports current alcohol use of about 1.0 - 2.0 standard drink of alcohol per week. He reports that he does not use drugs.   Family History:  His family history includes Heart attack in his mother; Leukemia in his brother; Prostate cancer in his brother and father.   Allergies Allergies  Allergen Reactions   Ace Inhibitors Cough   Metoprolol Diarrhea    Patient has been tolerating Propranolol 40 mg twice daily since at least 05/2022.   Nsaids Rash   Sulfa Antibiotics Rash     Home Medications  Prior to Admission medications   Medication Sig Start Date End Date Taking? Authorizing Provider  albuterol (ACCUNEB) 1.25 MG/3ML nebulizer solution Take 3 mLs by nebulization every 6 (six) hours as needed for wheezing. 11/20/22  Yes Wieting, Richard, MD  ALPRAZolam Duanne Moron) 0.5 MG tablet Take 0.5 mg by mouth daily as needed for anxiety. 10/06/22 10/06/23 Yes [provider]  aspirin EC 81 MG tablet Take 81 mg by mouth daily. Swallow whole.   Yes [provider]  diltiazem (CARDIZEM CD) 240 MG 24 hr capsule Take 240 mg by mouth every morning. 12/07/21  Yes [provider]  donepezil (ARICEPT) 5 MG tablet Take 5 mg by mouth at bedtime. 05/10/22  Yes [provider]  fluticasone (FLONASE) 50 MCG/ACT nasal spray Place 1 spray into both nostrils daily as needed for allergies or rhinitis.   Yes [provider]  folic acid (FOLVITE) 1 MG tablet Take 1 tablet (1 mg total) by mouth daily. 03/01/22  Yes Wieting, Richard, MD  gabapentin (NEURONTIN) 300 MG capsule Take 1 capsule (300 mg total) by mouth at bedtime. 02/28/22  Yes Wieting, Richard, MD  melatonin 5 MG TABS Take 1 tablet (5 mg total) by mouth at bedtime. 02/28/22  Yes  Wieting, Richard, MD  nitroGLYCERIN (NITROSTAT) 0.4 MG SL tablet Place 0.4 mg under the tongue every 5 (five) minutes as needed for chest pain.   Yes [provider]  omeprazole (PRILOSEC) 40 MG capsule Take 40 mg by mouth daily. 04/20/22  Yes [provider]  ondansetron (ZOFRAN-ODT) 4 MG disintegrating tablet Take 1 tablet (4 mg total) by mouth every 8 (eight) hours as needed for nausea or vomiting. 06/11/22  Yes Carrie Mew, MD  propranolol (INDERAL) 40 MG tablet Take 40 mg by mouth 2 (two) times daily. 11/07/22  Yes [provider]  QUEtiapine (SEROQUEL) 25 MG tablet One tab po qhs (can take extra dose if not sleeping) 02/28/22  Yes Wieting, Richard, MD  rosuvastatin (CRESTOR) 5 MG tablet Take 5 mg by mouth daily. 05/10/22  Yes [provider]  SYMBICORT 80-4.5 MCG/ACT inhaler Inhale 2 puffs into the lungs 2 (two) times daily as needed.  05/19/20  Yes [provider]  venlafaxine XR (EFFEXOR-XR) 37.5 MG 24 hr capsule Take 37.5 mg by mouth daily. 12/07/21  Yes [provider]  azithromycin (ZITHROMAX) 250 MG tablet One tab po daily for three days Patient not taking: Reported on 12/20/2022 11/21/22   Loletha Grayer, MD  guaiFENesin (ROBITUSSIN) 100 MG/5ML liquid Take 5 mLs by mouth every 4 (four) hours as needed for cough or to loosen phlegm. Patient not taking: Reported on 11/26/2022 02/28/22   Loletha Grayer, MD  polyethylene glycol (MIRALAX / GLYCOLAX) 17 g packet Take 17 g by mouth daily  as needed for moderate constipation. Patient not taking: Reported on 11/08/2022 02/28/22   Loletha Grayer, MD     Scheduled Meds:  Chlorhexidine Gluconate Cloth  6 each Topical Daily   docusate  100 mg Per Tube BID   liver oil-zinc oxide   Topical QID   mouth rinse  15 mL Mouth Rinse Q2H   Continuous Infusions:  sodium chloride     sodium chloride 20 mL/hr at 01/17/23 0700   anticoagulant sodium citrate     morphine 7 mg/hr (01/17/23 0700)    PRN Meds:.acetaminophen, albuterol, alteplase, anticoagulant sodium citrate, fentaNYL (SUBLIMAZE) injection, glycopyrrolate **OR** glycopyrrolate **OR** glycopyrrolate, haloperidol lactate, heparin sodium (porcine), lidocaine (PF), lidocaine-prilocaine, lip balm, midazolam, morphine, ondansetron **OR** ondansetron (ZOFRAN) IV, mouth rinse, pentafluoroprop-tetrafluoroeth, polyvinyl alcohol   Critical care provider statement:   Total critical care time: 33 minutes   Performed by: Lanney Gins MD   Critical care time was exclusive of separately billable procedures and treating other patients.   Critical care was necessary to treat or prevent imminent or life-threatening deterioration.   Critical care was time spent personally by me on the following activities: development of treatment plan with patient and/or surrogate as well as nursing, discussions with consultants, evaluation of patient's response to treatment, examination of patient, obtaining history from patient or surrogate, ordering and performing treatments and interventions, ordering and review of laboratory studies, ordering and review of radiographic studies, pulse oximetry and re-evaluation of patient's condition.    Ottie Glazier, M.D.  Pulmonary & Critical Care Medicine

## 2023-01-26 NOTE — Progress Notes (Signed)
On comfort measures, appears to be much more comfortable now that he has pain medication and sedation back. Will continue to monitor

## 2023-01-26 NOTE — Progress Notes (Signed)
Pt extubated to comfort care.  

## 2023-01-26 NOTE — Progress Notes (Signed)
Much better night with proper main management and sedation, heart rate has been under 100 for most of the shift since starting comfort measures, more compliant with the vent as well.

## 2023-01-26 NOTE — IPAL (Signed)
  Interdisciplinary Goals of Care Family Meeting   Date carried out: Jan 20, 2023  Location of the meeting: Bedside  Member's involved: Nurse Practitioner and Family Member or next of kin  Durable Power of Attorney or acting medical decision maker: Spouse Rudolf Blizard and Daughter Lelon Huh     Discussion: We discussed goals of care for [employer] .  They stated pt would not want a tracheostomy or PEG tube and would not want to a nursing facility   Code status:   Code Status: DNR   Disposition: In-patient comfort care  Time spent for the meeting: 10 minutes    Donell Beers, [place] Pager [phone] (please enter 7 digits) PCCM Consult Pager [phone] (please enter 7 digits)  ;

## 2023-01-26 NOTE — Death Summary Note (Signed)
DEATH SUMMARY   Patient Details  Name: Mika Anastasi MRN: [id number] DOB: 11-28-1941  Admission/Discharge Information   Admit Date:  01/21/2023  Date of Death: Date of Death: January 27, 2023  Time of Death: Time of Death: 03/09/06  Length of Stay: 2023/03/02  Referring Physician: [name], MD   Reason(s) for Hospitalization  AMS  Diagnoses  Preliminary cause of death: Acute hypoxic hypercapnic respiratory failure secondary to pneumonia, ETOH withdrawal with DTs and seizures, AKI on HD, Severe Toxic Metabolic Encephalopathy  Secondary Diagnoses (including complications and co-morbidities):  Principal Problem:   Seizure-like activity ([place]) Active Problems:   Essential hypertension   COPD (chronic obstructive pulmonary disease) (HCC)   Abnormal LFTs   Hypomagnesemia   Hypokalemia   HLD (hyperlipidemia)   Diabetes mellitus without complication (HCC)   Dementia ([place])   Thrombocytopenia ([place])   Alcohol withdrawal ([place])   Acute hypoxic respiratory failure ([place])   Acute renal failure ([place])   Pneumothorax  Brief Hospital Course (including significant findings, care, treatment, and services provided and events leading to death)  Treavor Blomquist is a 82 y.o. year old male who was admitted with possible seizure activity suspect secondary to severe ETOH withdrawal requiring mechanical intubation. CT Head showed no evidence of acute intracranial abnormality. On 12/23/22- patient was still having seizures despite propofol and PRN Ativan. He was started on phenobarb and pressors weaned off.  On 12/24/22-patient improved, dcd phenobarb and wake up assessment was very poor  with onset of tachyarrythmia in 140s and seizure like activity upon reduction of sedation. Patient remained on high dose sedation due to worsening seizure like activity and tachyarrythmia. Wife and nephew at bedside we discussed short term plan. Today we have transitioned to herparin from lovenox due to worsening renal impairment.  On 12/27/22:  AKI was slowly improving.  Started Precedex to assist with WUA. On 12/28/22: a follow up MR Brain WO Contrast was done which showed no acute or reversible finding. On 12/29/22: Pt failed WUA . On 12/30/22: EEG negative for electrographic seizures, however severe diffuse slowing indicative of global cerebral dysfunction, medication effect, or both. On 12/30/22: No acute events overnight. Pt remains mechanically intubated FiO2 35%. On 01/01/23: Pt with continued poor neurological exam remains unresponsive despite not receiving sedating medications since 12/30/22.  CT Head and EEG negative.  Remains on minimal mechanical ventilator settings, however neurological exam impeding extubation. On 01/02/23:Neuro exam remained  unchanged which is precluding extubation. Neurology was consulted who recommened treating metabolic causes,  IR to evaluate for LP.  Consult Palliative Care for [place].  Code status changed to DNR.  HD catheter placed as family wishes to trial HD to see if would improve mental status. On 01/03/23: Slightly more responsive this morning. ETT cuff with severe leak requiring exchange of ETT.  Plan for trial of HD.  Plan for LP with IR. On 01/04/23: Pt received 4 hrs of CRRT overnight. On 01/05/23: Pt had 1st HD session neurological exam remains poor pt is still unable to follow commands, but opens eyes spontaneously.  He remains mechanically intubated on minimal vent settings . On 01/06/23- GOC was discussed with family that patient will likely need prolonged recovery at [facility] and need tracheostomy placement with PEG. On 01-27-2023, patient's family decided to transition patient to comfort care with terminal extubation. (See detailed IPAL). Patient was transitioned to comfort care and terminally extubated at 11.35 am. He passed away at 03-09-1906 with family at the bedside.  Pertinent Labs and Studies  Significant Diagnostic Studies DG Abd 1 View  Result Date: 01/08/2023 CLINICAL DATA:  Abdominal pain. EXAM: ABDOMEN -  1 VIEW COMPARISON:  Radiograph 01/03/2023 FINDINGS: Tip and side port of the enteric tube below the diaphragm in the stomach. No bowel dilatation to suggest obstruction. Small volume of formed stool in the colon. No visible radiopaque calculi or abnormal soft tissue calcifications. Right upper quadrant surgical clips typical of cholecystectomy. Previous right pneumothorax is not seen on the current exam. Vertebroplasty within L2. IMPRESSION: 1. Normal bowel gas pattern, enteric tube is in place. 2. Small volume of colonic stool. Electronically Signed   By: Keith Rake M.D.   On: 01/08/2023 14:24   DG Chest Port 1 View  Result Date: 01/06/2023 CLINICAL DATA:  [id number] with respiratory failure. EXAM: PORTABLE CHEST 1 VIEW COMPARISON:  Portable chest yesterday at 2:30 p.m. FINDINGS: 4:45 a.m. ETT tip interval pullback to 6.4 cm from the carina, previously 3.7 cm. Double-lumen left IJ catheter again has its tip in the mid SVC with NGT entering the stomach with the intragastric course not fully filmed. The cardiomediastinal silhouette and vasculature are normal. Interval removal right basilar pigtail chest tube. There is no visible pneumothorax. Central bronchial thickening continues to be seen as well as patchy consolidation in the left lower lobe retrocardiac area. The remainder of the lungs are clear. No new abnormality is seen. No interval worsening. IMPRESSION: 1. Interval removal of the right basilar pigtail chest tube. No visible pneumothorax. 2. Central bronchial thickening continues to be seen as well as patchy consolidation in the left lower lobe retrocardiac area. No interval worsening. Electronically Signed   By: Telford Nab M.D.   On: 01/06/2023 07:02   CT HEAD WO CONTRAST (5MM)  Result Date: 01/06/2023 CLINICAL DATA:  Altered mental status. EXAM: CT HEAD WITHOUT CONTRAST TECHNIQUE: Contiguous axial images were obtained from the base of the skull through the vertex without intravenous contrast.  RADIATION DOSE REDUCTION: This exam was performed according to the departmental dose-optimization program which includes automated exposure control, adjustment of the mA and/or kV according to patient size and/or use of iterative reconstruction technique. COMPARISON:  January 01, 2023 FINDINGS: Brain: There is mild to moderate severity cerebral atrophy with widening of the extra-axial spaces and ventricular dilatation. There are areas of decreased attenuation within the white matter tracts of the supratentorial brain, consistent with microvascular disease changes. Vascular: No hyperdense vessel or unexpected calcification. Skull: Normal. Negative for fracture or focal lesion. Sinuses/Orbits: There is marked severity bilateral maxillary sinus, bilateral ethmoid sinus, sphenoid sinus and frontal sinus mucosal thickening. Other: None. IMPRESSION: 1. No acute intracranial abnormality. 2. Generalized cerebral atrophy with widening of the extra-axial spaces and ventricular dilatation. 3. Marked severity pansinus disease. Electronically Signed   By: Virgina Norfolk M.D.   On: 01/06/2023 01:38   DG Chest Port 1 View  Result Date: 01/05/2023 CLINICAL DATA:  [id number] Chest tube in place [id number] EXAM: PORTABLE CHEST 1 VIEW COMPARISON:  January 04, 2023 FINDINGS: The cardiomediastinal silhouette is unchanged in contour.ETT tip terminates approximately 3.7 cm above the carina. The enteric tube courses through the chest to the abdomen beyond the field-of-view. LEFT neck CVC tip terminates over the confluence of the LEFT brachiocephalic and SVC. RIGHT-sided chest tube in similar position. No pleural effusion. No significant pneumothorax. Similar patchy perihilar predominant opacities with bronchial wall thickening. IMPRESSION: 1.  Support apparatus as described above. 2.  No significant pneumothorax with RIGHT chest tube in place. Electronically Signed  By: Valentino Saxon M.D.   On: 01/05/2023 14:57   DG Chest Port 1  View  Result Date: 01/04/2023 CLINICAL DATA:  Follow-up right pneumothorax and right chest tube. EXAM: PORTABLE CHEST 1 VIEW COMPARISON:  01/03/2023 and older studies. FINDINGS: Right inferior hemithorax chest tube is stable.  No pneumothorax. Opacities in the perihilar and medial lung bases suspected to be atelectasis. No pulmonary edema. Stable well-positioned endotracheal tube, left internal jugular central venous line and nasal/orogastric tube. IMPRESSION: 1. No significant change from the most recent prior exam. 2. Stable inferior right hemithorax chest tube.  No pneumothorax. 3. Perihilar medial lung base opacities suspect to be atelectasis. Pneumonia possible. No pulmonary edema. Electronically Signed   By: Lajean Manes M.D.   On: 01/04/2023 10:20   IR FL GUIDED LOC OF NEEDLE/CATH TIP FOR SPINAL INJECT RT  Result Date: 01/03/2023 CLINICAL DATA:  Patient with altered mental status, decreased level of consciousness. Request received for diagnostic lumbar puncture. EXAM: DIAGNOSTIC LUMBAR PUNCTURE UNDER FLUOROSCOPIC GUIDANCE COMPARISON:  None Available. FLUOROSCOPY: Radiation Exposure Index (as provided by the fluoroscopic device): 4.20 mGy Kerma PROCEDURE: This procedure was performed by Reatha Armour, PA-C and supervised and interpreted by Dr Michaelle Birks Informed consent was obtained from the the patient and/or patient's representative prior to the procedure, including potential complications of headache, allergy, and pain. With the patient prone, the lower back was prepped with Betadine. 1% Lidocaine was used for local anesthesia. Lumbar puncture was performed at the L3-L4 level using a 22 gauge needle with return of clear CSF with an opening pressure of 25 cm water. Closing pressure of 15 cm water. 10 ml of CSF were obtained for laboratory studies. The patient tolerated the procedure well and there were no apparent complications. IMPRESSION: 1.  Successful, uncomplicated lumbar puncture at L3-L4 level.  10 mL of clear CSF submitted for analysis. 2. Documented opening pressure of 25 cm H20, and closing pressure of 15 cm H20 Electronically Signed   By: Michaelle Birks M.D.   On: 01/03/2023 16:20   DG Chest Port 1 View  Result Date: 01/03/2023 CLINICAL DATA:  Right pneumothorax, chest tube placement EXAM: PORTABLE CHEST 1 VIEW COMPARISON:  Previous studies including the examination done earlier today FINDINGS: There is interval placement of right chest tube with its tip in right lower lung field. There is resolution of right pneumothorax. Subcutaneous emphysema is seen in right chest wall. Tip of endotracheal tube is 4.5 cm above the carina. Enteric tube is noted traversing the esophagus. Tip of left IJ central venous catheter is seen in superior vena cava. Increased interstitial markings are seen in both lungs suggesting scarring or interstitial pneumonia. No new focal infiltrates are seen. There is no significant pleural effusion. IMPRESSION: Interval clearing of right pneumothorax after placement of right chest tube. No new focal infiltrates are seen. Electronically Signed   By: Elmer Picker M.D.   On: 01/03/2023 13:09   DG Chest Port 1 View  Addendum Date: 01/03/2023   ADDENDUM REPORT: 01/03/2023 12:19 ADDENDUM: Critical Value/emergent results were called by telephone at the time of interpretation on 01/03/2023 at 12:19 pm to provider [facility] , who verbally acknowledged these results. Electronically Signed   By: Marijo Conception M.D.   On: 01/03/2023 12:19   Result Date: 01/03/2023 CLINICAL DATA:  Status post intubation. EXAM: PORTABLE CHEST 1 VIEW COMPARISON:  January 02, 2023. FINDINGS: There is interval development of large right sided pneumothorax. Endotracheal and nasogastric tubes appear to be  in good position. Left internal jugular catheter is unchanged. Minimal left basilar subsegmental atelectasis is noted. Bony thorax is unremarkable. IMPRESSION: Interval development of large right-sided  pneumothorax. Electronically Signed: By: Marijo Conception M.D. On: 01/03/2023 12:11   DG Abd 1 View  Result Date: 01/03/2023 CLINICAL DATA:  82 year old male status post intubation and oral enteric tube placement. EXAM: ABDOMEN - 1 VIEW COMPARISON:  Portable abdomen 01/01/2023. FINDINGS: Portable AP semi upright view at 1159 hours. Enteric tube terminates in the stomach near midline. Side hole at the level of the gastric body. Stable cholecystectomy clips. Mid lumbar vertebral augmentation again noted. Nonobstructed visible bowel gas pattern. At least moderate sized right pneumothorax is visible, see portable chest x-ray reported separately. IMPRESSION: 1. At least moderate right side pneumothorax is visible, see portable chest x-ray from the same time reported separately. 2. Satisfactory enteric tube placement into the stomach. Electronically Signed   By: Genevie Ann M.D.   On: 01/03/2023 12:14   DG Chest Port 1 View  Result Date: 01/02/2023 CLINICAL DATA:  Central line placement EXAM: PORTABLE CHEST 1 VIEW COMPARISON:  Chest x-ray 01/01/2023 FINDINGS: Endotracheal tube tip is 3.7 cm above the carina. Left-sided central venous catheter tip projects over the brachiocephalic SVC junction. Enteric tube extends below the diaphragm into the stomach. Stiff the cardiomediastinal silhouette is stable, the heart is enlarged. There central pulmonary vascular congestion. There is a small left pleural effusion, new from prior. No pneumothorax or acute fracture. IMPRESSION: 1. Left-sided central venous catheter tip projects over the brachiocephalic SVC junction. 2. Cardiomegaly with central pulmonary vascular congestion and small left pleural effusion. Electronically Signed   By: Ronney Asters M.D.   On: 01/02/2023 19:49   EEG adult  Result Date: 01/02/2023 Lora Havens, MD     01/02/2023  2:27 PM Patient Name: Adley Mazurowski MRN: [id number] Epilepsy Attending: Lora Havens Referring Physician/Provider: Teressa Lower, NP Date: 01/02/2023 Duration: 33.19 mins Patient history:  82 year old man past history of alcohol abuse COPD hypertension diabetes coronary artery disease history of nonepileptic spells memory changes presenting for complaints of seizures.  EEG to evaluate for seizure. Level of alertness: lethargic AEDs during EEG study: Technical aspects: This EEG study was done with scalp electrodes positioned according to the 10-20 International system of electrode placement. Electrical activity was reviewed with band pass filter of 1-70Hz , sensitivity of 7 uV/mm, display speed of 54m/sec with a 60Hz  notched filter applied as appropriate. EEG data were recorded continuously and digitally stored.  Video monitoring was available and reviewed as appropriate. Description: EEG showed continuous generalized 3 to 6 Hz theta-delta slowing. Hyperventilation and photic stimulation were not performed.   ABNORMALITY - Continuous slow, generalized IMPRESSION: This study is suggestive of moderate diffuse encephalopathy, nonspecific etiology. No seizures or epileptiform discharges were seen throughout the recording. Priyanka [name]  CT HEAD WO CONTRAST (5MM)  Result Date: 01/01/2023 CLINICAL DATA:  Mental status change. EXAM: CT HEAD WITHOUT CONTRAST TECHNIQUE: Contiguous axial images were obtained from the base of the skull through the vertex without intravenous contrast. RADIATION DOSE REDUCTION: This exam was performed according to the departmental dose-optimization program which includes automated exposure control, adjustment of the mA and/or kV according to patient size and/or use of iterative reconstruction technique. COMPARISON:  MRI head 12/28/2022.  CT head 12/13/2022. FINDINGS: Brain: No evidence of acute infarction, hemorrhage, hydrocephalus, extra-axial collection or mass lesion/mass effect. Again seen is mild diffuse atrophy and mild periventricular white matter  hypodensity, likely chronic small vessel ischemic change.  Stable calcification in the posterior left thalamus. Vascular: Atherosclerotic calcifications are present within the cavernous internal carotid arteries. Skull: Normal. Negative for fracture or focal lesion. Sinuses/Orbits: There is an air-fluid level in the left sphenoid sinus and left maxillary sinus. There is mucosal thickening of ethmoid air cells, frontal sinuses, sphenoid sinuses and maxillary sinuses. There is a partial right mastoid effusion. Orbits are within normal limits. Other: None. IMPRESSION: 1. No acute intracranial process. 2. Stable atrophy and chronic small vessel ischemic change. 3. Paranasal sinus disease with air-fluid levels in the left sphenoid and maxillary sinuses. Correlate for acute sinusitis. 4. Partial right mastoid effusion. Electronically Signed   By: Ronney Asters M.D.   On: 01/01/2023 15:53   DG Abd 1 View  Result Date: 01/01/2023 CLINICAL DATA:  Reposition of orogastric tube. EXAM: ABDOMEN - 1 VIEW COMPARISON:  Abdominal radiograph dated 12/31/2022. FINDINGS: An enteric tube terminates in the stomach with the side port near the gastric cardia. IMPRESSION: An enteric tube terminates in the stomach with the side port near the gastric cardia. Electronically Signed   By: Zerita Boers M.D.   On: 01/01/2023 13:08   DG Chest Port 1 View  Result Date: 01/01/2023 CLINICAL DATA:  The patient's orogastric tube was repositioned, concern for fluid in the lungs during repositioning. EXAM: PORTABLE CHEST 1 VIEW COMPARISON:  Chest radiograph dated 12/30/2022. FINDINGS: The heart size and mediastinal contours are within normal limits. Moderate bilateral perihilar interstitial and airspace opacities are noted. Left basilar atelectasis/airspace disease appears similar to prior exam. There is no pleural effusion or pneumothorax. An endotracheal tube terminates in the midthoracic trachea. An enteric tube enters the stomach and terminates below the field of view. Degenerative changes are seen in  the spine. IMPRESSION: 1. Moderate bilateral perihilar interstitial and airspace opacities may reflect pulmonary edema. Aspiration can not be excluded. 2. Left basilar atelectasis/airspace disease, similar to prior exam. Electronically Signed   By: Zerita Boers M.D.   On: 01/01/2023 13:07   ECHOCARDIOGRAM COMPLETE  Result Date: 12/31/2022    ECHOCARDIOGRAM REPORT   Patient Name:   Jilda Roche Date of Exam: 12/31/2022 Medical Rec #:  [id number]         Height:       64.0 in Accession #:    [id number]        Weight:       189.4 lb Date of Birth:  [birth year]        BSA:          1.912 m Patient Age:    23 years          BP:           156/77 mmHg Patient Gender: M                 HR:           102 bpm. Exam Location:  [facility] Procedure: 2D Echo Indications:     Elevated Troponin  History:         Patient has no prior history of Echocardiogram examinations.  Sonographer:     Kathlen Brunswick RDCS Referring Phys:  [id number] Teressa Lower Diagnosing Phys: Neoma Laming  Sonographer Comments: Echo performed with patient supine and on artificial respirator and suboptimal subcostal window. Image acquisition challenging due to respiratory motion. IMPRESSIONS  1. Left ventricular ejection fraction, by estimation, is 60 to 65%. The left ventricle has normal function. The left  ventricle has no regional wall motion abnormalities. There is mild left ventricular hypertrophy. Left ventricular diastolic parameters are consistent with Grade I diastolic dysfunction (impaired relaxation).  2. Right ventricular systolic function is normal. The right ventricular size is normal.  3. Left atrial size was mildly dilated.  4. Right atrial size was mildly dilated.  5. The mitral valve is normal in structure. Trivial mitral valve regurgitation. No evidence of mitral stenosis.  6. The aortic valve is normal in structure. Aortic valve regurgitation is not visualized. No aortic stenosis is present.  7. The inferior vena cava is normal in size  with greater than 50% respiratory variability, suggesting right atrial pressure of 3 mmHg. FINDINGS  Left Ventricle: Left ventricular ejection fraction, by estimation, is 60 to 65%. The left ventricle has normal function. The left ventricle has no regional wall motion abnormalities. The left ventricular internal cavity size was normal in size. There is  mild left ventricular hypertrophy. Left ventricular diastolic parameters are consistent with Grade I diastolic dysfunction (impaired relaxation). Right Ventricle: The right ventricular size is normal. No increase in right ventricular wall thickness. Right ventricular systolic function is normal. Left Atrium: Left atrial size was mildly dilated. Right Atrium: Right atrial size was mildly dilated. Pericardium: There is no evidence of pericardial effusion. Mitral Valve: The mitral valve is normal in structure. Trivial mitral valve regurgitation. No evidence of mitral valve stenosis. Tricuspid Valve: The tricuspid valve is normal in structure. Tricuspid valve regurgitation is trivial. No evidence of tricuspid stenosis. Aortic Valve: The aortic valve is normal in structure. Aortic valve regurgitation is not visualized. No aortic stenosis is present. Aortic valve peak gradient measures 9.9 mmHg. Pulmonic Valve: The pulmonic valve was normal in structure. Pulmonic valve regurgitation is not visualized. No evidence of pulmonic stenosis. Aorta: The aortic root is normal in size and structure. Venous: The inferior vena cava is normal in size with greater than 50% respiratory variability, suggesting right atrial pressure of 3 mmHg. IAS/Shunts: No atrial level shunt detected by color flow Doppler.  LEFT VENTRICLE PLAX 2D LVIDd:         3.70 cm     Diastology LVIDs:         2.50 cm     LV e' medial:    8.16 cm/s LV PW:         1.30 cm     LV E/e' medial:  8.7 LV IVS:        1.20 cm     LV e' lateral:   8.92 cm/s LVOT diam:     1.90 cm     LV E/e' lateral: 8.0 LV SV:         54 LV  SV Index:   28 LVOT Area:     2.84 cm  LV Volumes (MOD) LV vol d, MOD A4C: 44.8 ml LV vol s, MOD A4C: 17.8 ml LV SV MOD A4C:     44.8 ml RIGHT VENTRICLE RV Basal diam:  2.30 cm RV S prime:     15.90 cm/s TAPSE (M-mode): 1.5 cm LEFT ATRIUM             Index        RIGHT ATRIUM           Index LA diam:        3.50 cm 1.83 cm/m   RA Area:     10.10 cm LA Vol (A2C):   31.2 ml 16.32 ml/m  RA Volume:   18.20  ml  9.52 ml/m LA Vol (A4C):   28.4 ml 14.86 ml/m LA Biplane Vol: 31.1 ml 16.27 ml/m  AORTIC VALVE                 PULMONIC VALVE AV Area (Vmax): 2.00 cm     PV Vmax:       1.17 m/s AV Vmax:        157.00 cm/s  PV Peak grad:  5.5 mmHg AV Peak Grad:   9.9 mmHg LVOT Vmax:      110.50 cm/s LVOT Vmean:     73.300 cm/s LVOT VTI:       0.192 m  AORTA Ao Root diam: 3.20 cm Ao Asc diam:  3.00 cm MITRAL VALVE               TRICUSPID VALVE MV Area (PHT): 5.09 cm    TR Peak grad:   51.3 mmHg MV Decel Time: 149 msec    TR Vmax:        358.00 cm/s MV E velocity: 71.30 cm/s MV A velocity: 83.30 cm/s  SHUNTS MV E/A ratio:  0.86        Systemic VTI:  0.19 m                            Systemic Diam: 1.90 cm Neoma Laming Electronically signed by Neoma Laming Signature Date/Time: 12/31/2022/12:54:05 PM    Final    DG Abd 1 View  Result Date: 12/31/2022 CLINICAL DATA:  Abdominal distension EXAM: ABDOMEN - 1 VIEW COMPARISON:  Three days ago FINDINGS: Enteric tube with tip at the stomach and side port near the GE junction. Normal bowel gas pattern. Retrocardiac airspace disease with air bronchogram. Cholecystectomy clips. IMPRESSION: Enteric tube with tip at the stomach.  Normal bowel gas pattern. Retrocardiac consolidation/collapse. Electronically Signed   By: Jorje Guild M.D.   On: 12/31/2022 10:31   DG Chest Port 1 View  Result Date: 12/30/2022 CLINICAL DATA:  Acute respiratory failure EXAM: PORTABLE CHEST 1 VIEW COMPARISON:  Chest x-ray dated December 28, 2022 FINDINGS: Unchanged position of ETT and enteric tube. Patient  is rotated to the left. Cardiac and mediastinal contours are within normal limits. New left retrocardiac consolidation. No large pleural effusion or evidence of pneumothorax. IMPRESSION: New left retrocardiac consolidation, concerning for infection or left lower lobe collapse. Electronically Signed   By: Yetta Glassman M.D.   On: 12/30/2022 10:24   EEG adult  Result Date: 12/30/2022 Derek Jack, MD     12/30/2022  8:58 AM Routine EEG Report Nathanel Tallman is a 82 y.o. male with a history of seizure who is undergoing an EEG to evaluate for seizures. Report: This EEG was acquired with electrodes placed according to the International 10-20 electrode system (including Fp1, Fp2, F3, F4, C3, C4, P3, P4, O1, O2, T3, T4, T5, T6, A1, A2, Fz, Cz, Pz). The following electrodes were missing or displaced: none. The best background was diffusely slow at 3-5 Hz with overriding beta frequencies. This activity is reactive to stimulation. No sleep architecture was identified. There was no focal slowing. There were no definitive interictal epileptiform discharges. There were no electrographic seizures identified. Photic stimulation and hyperventilation were not performed. Impression and clinical correlation: This EEG was obtained while sedated on propofol and is abnormal due to: - severe diffuse slowing indicative of global cerebral dysfunction, medication effect, or both Su Monks, MD [facility] [phone] If  7pm- 7am, please page neurology on call as listed in [facility].   MR BRAIN WO CONTRAST  Result Date: 12/28/2022 CLINICAL DATA:  Mental status change of unknown cause.  Seizure. EXAM: MRI HEAD WITHOUT CONTRAST TECHNIQUE: Multiplanar, multiecho pulse sequences of the brain and surrounding structures were obtained without intravenous contrast. COMPARISON:  Head CT 11/26/2022 FINDINGS: Brain: Diffusion imaging does not show any acute or subacute infarction or other cause of restricted diffusion. There is  generalized age related atrophy. No focal abnormality affects the brainstem or cerebellum. Cerebral hemispheres show only minimal changes of small vessel disease of the white matter, less than often seen at this age. No cortical or large vessel territory infarction. Chronic calcification in the posterolateral left thalamus which is nonprogressive. No evidence of mass lesion, hemorrhage, hydrocephalus or extra-axial collection. Vascular: Major vessels at the base of the brain show flow. Skull and upper cervical spine: Negative Sinuses/Orbits: Sinus mucosal thickening and layering fluid as often seen in patients with mechanical ventilation. Mastoid effusions as often seen in patients with mechanical ventilation. Orbits negative. Other: None IMPRESSION: 1. No acute or reversible finding. Generalized age related atrophy. Minimal small vessel change of the cerebral hemispheric white matter, less than often seen at this age. Chronic nonprogressive benign type calcification within the posterolateral left thalamus. 2. Sinus mucosal thickening and layering fluid as often seen in patients with mechanical ventilation. Mastoid effusions as often seen in patients with mechanical ventilation. Electronically Signed   By: Nelson Chimes M.D.   On: 12/28/2022 14:34   DG Chest Port 1 View  Result Date: 12/28/2022 CLINICAL DATA:  Check endotracheal tube placement EXAM: PORTABLE CHEST 1 VIEW COMPARISON:  12/24/2022 FINDINGS: Endotracheal tube and gastric catheter are again seen and stable. Cardiac shadow is within normal limits. The lungs are hypoinflated but clear. No bony abnormality is noted. IMPRESSION: Tubes and lines in stable position. Poor inspiratory effort without focal infiltrate. Electronically Signed   By: Inez Catalina M.D.   On: 12/28/2022 01:11   DG Abd 1 View  Result Date: 12/28/2022 CLINICAL DATA:  Abdominal distension EXAM: ABDOMEN - 1 VIEW COMPARISON:  12/25/2022 FINDINGS: Gastric catheter is again noted in the  stomach. Changes of prior vertebral augmentation at L2 are seen. Scattered large and small bowel gas is noted. No obstructive changes are seen. No acute bony abnormality is noted. IMPRESSION: Gastric catheter within the stomach.  No acute abnormality noted. Electronically Signed   By: Inez Catalina M.D.   On: 12/28/2022 01:10   US RENAL  Result Date: 12/25/2022 CLINICAL DATA:  Acute renal failure. EXAM: RENAL / URINARY TRACT ULTRASOUND COMPLETE COMPARISON:  None Available. FINDINGS: Right Kidney: Renal measurements: 12.4 cm x 5.9 cm x 5.1 cm = volume: 194.4 mL. Echogenicity within normal limits. No mass or hydronephrosis visualized. Left Kidney: Renal measurements: 13.2 cm x 5.9 cm x 5.1 cm = volume: 208 mL. Echogenicity within normal limits. No mass or hydronephrosis visualized. Bladder: The urinary bladder is empty and subsequently limited in evaluation. Other: None. IMPRESSION: Unremarkable renal ultrasound. Electronically Signed   By: Virgina Norfolk M.D.   On: 12/25/2022 18:29   DG Abd 1 View  Result Date: 12/25/2022 CLINICAL DATA:  Abdominal distension EXAM: ABDOMEN - 1 VIEW COMPARISON:  12/14/2022 FINDINGS: Enteric tube terminates within the proximal stomach. Gaseous distension of the colon. No abnormally dilated loops of small bowel are seen. No gross free intraperitoneal air. Cholecystectomy clips. IMPRESSION: Gaseous distension of the colon. Enteric tube terminates within the proximal  stomach. Electronically Signed   By: Davina Poke D.O.   On: 12/25/2022 11:24   DG Chest Port 1 View  Result Date: 12/24/2022 CLINICAL DATA:  Atelectasis EXAM: PORTABLE CHEST 1 VIEW COMPARISON:  12/24/2022 FINDINGS: Endotracheal tube terminates approximately 2.3 cm above the carina. Enteric tube courses below the diaphragm with distal tip beyond the inferior margin of the film. Stable heart size. Opacity at the left lung base may partially be secondary to prominent epicardial fat pad as seen on prior CT.  Slightly low lung volumes with bibasilar atelectasis. No pneumothorax. IMPRESSION: 1. Endotracheal tube terminates approximately 2.3 cm above the carina. 2. Low lung volumes with bibasilar atelectasis. Electronically Signed   By: Davina Poke D.O.   On: 12/24/2022 09:40   EEG adult  Result Date: 12/23/2022 Lora Havens, MD     12/23/2022  2:00 PM Patient Name: Jordy Verba MRN: [id number] Epilepsy Attending: Lora Havens Referring Physician/Provider: Larena Glassman Date: 12/23/2022 Duration: 35.35 mins Patient history: 82yo M with ams. EEG to evaluate for seizure. Level of alertness:  comatose AEDs during EEG study: Propofol, LEV, Phenobarb Technical aspects: This EEG study was done with scalp electrodes positioned according to the 10-20 International system of electrode placement. Electrical activity was reviewed with band pass filter of 1-70Hz , sensitivity of 7 uV/mm, display speed of 45m/sec with a 60Hz  notched filter applied as appropriate. EEG data were recorded continuously and digitally stored.  Video monitoring was available and reviewed as appropriate. Description: EEG showed burst attenuation pattern with burst if 5-7h theta slowing lasting 2-4 seconds alternating with low amplitude 12-15hz  generalized beta activity. Hyperventilation and photic stimulation were not performed.   ABNORMALITY - Burst attenuation, generalized IMPRESSION: This study is suggestive of severe to profound diffuse encephalopathy, nonspecific etiology but likely related to sedation. No seizures or epileptiform discharges were seen throughout the recording. PLora Havens  DG Abd 1 View  Result Date: 12/23/2022 CLINICAL DATA:  OG tube placement EXAM: ABDOMEN - 1 VIEW COMPARISON:  None Available. FINDINGS: OG tube tip in the stomach. Nonobstructive bowel gas pattern. Prior cholecystectomy. IMPRESSION: OG tube in the stomach. Electronically Signed   By: KRolm BaptiseM.D.   On: 12/20/2022 19:23   DG  Chest Portable 1 View  Result Date: 11/25/2022 CLINICAL DATA:  Post intubation EXAM: PORTABLE CHEST 1 VIEW COMPARISON:  12/08/2022 FINDINGS: Endotracheal tube is 2 cm above the carina. NG tube in the stomach. Low lung volumes. No confluent airspace opacities or effusions. Heart is normal size. No acute bony abnormality. Calcified granuloma at the left lung base. IMPRESSION: Endotracheal tube 2 cm above the carina. Low lung volumes.  No acute cardiopulmonary disease. Electronically Signed   By: KRolm BaptiseM.D.   On: 12/11/2022 19:23   DG Forearm Right  Result Date: 12/18/2022 CLINICAL DATA:  Right forearm bruising, pain EXAM: RIGHT FOREARM - 2 VIEW COMPARISON:  None Available. FINDINGS: Frontal and lateral views of the right forearm are obtained of 3 images. There are no acute displaced fractures. Alignment is anatomic. Osteoarthritis of the right elbow, greatest laterally. Soft tissues are grossly normal. IMPRESSION: 1. No acute fracture. 2. Right elbow osteoarthritis. Electronically Signed   By: MRanda NgoM.D.   On: 12/16/2022 16:32   DG Chest 2 View  Result Date: 12/24/2022 CLINICAL DATA:  Seizure this morning.  Productive cough. EXAM: CHEST - 2 VIEW COMPARISON:  11/18/2022 FINDINGS: Heart size is normal. Low lung volumes. No focal consolidations or  pleural effusions. LEFT LOWER lobe granuloma. No pneumothorax or acute fracture. IMPRESSION: No active cardiopulmonary disease. Electronically Signed   By: Nolon Nations M.D.   On: 11/28/2022 16:29   CT Head Wo Contrast  Result Date: 11/26/2022 CLINICAL DATA:  Seizure. EXAM: CT HEAD WITHOUT CONTRAST TECHNIQUE: Contiguous axial images were obtained from the base of the skull through the vertex without intravenous contrast. RADIATION DOSE REDUCTION: This exam was performed according to the departmental dose-optimization program which includes automated exposure control, adjustment of the mA and/or kV according to patient size and/or use of  iterative reconstruction technique. COMPARISON:  Head CT 10/30/2022 FINDINGS: Brain: There is no evidence of an acute infarct, intracranial hemorrhage, mass, midline shift, or extra-axial fluid collection. Calcification in the dorsal left thalamus is unchanged. There is mild cerebral atrophy. Hypodensities in the cerebral white matter bilaterally are unchanged and nonspecific but compatible with mild chronic small vessel ischemic disease. Vascular: Calcified atherosclerosis at the skull base. No suspicious vessel hyperdensity. Skull: No acute fracture or suspicious osseous lesion. Sinuses/Orbits: Minimal mucosal thickening in the paranasal sinuses. Small right mastoid effusion. Bilateral cataract extraction. Other: None. IMPRESSION: 1. No evidence of acute intracranial abnormality. 2. Mild chronic small vessel ischemic disease. Electronically Signed   By: Logan Bores M.D.   On: 12/03/2022 13:45    Microbiology Recent Results (from the past 240 hour(s))  MRSA Next Gen by PCR, Nasal     Status: None   Collection Time: 12/31/22 10:49 AM   Specimen: Nasal Mucosa; Nasal Swab  Result Value Ref Range Status   MRSA by PCR Next Gen NOT DETECTED NOT DETECTED Final    Comment: (NOTE) The GeneXpert MRSA Assay (FDA approved for NASAL specimens only), is one component of a comprehensive MRSA colonization surveillance program. It is not intended to diagnose MRSA infection nor to guide or monitor treatment for MRSA infections. Test performance is not FDA approved in patients less than 2 years old. Performed at [facility], [address] [place], [place] [address]   CSF culture w Gram Stain     Status: None   Collection Time: 01/03/23  4:14 PM   Specimen: CSF; Cerebrospinal Fluid  Result Value Ref Range Status   Specimen Description   Final    CSF Performed at [facility], [address], [place], [place] [address]    Special Requests   Final    NONE Performed at [facility], [address] [place], [place] [address]    Gram Stain   Final    NO ORGANISMS SEEN NO WBC SEEN NO RBC SEEN Performed at [facility], [address] [place], [place] [address]    Culture   Final    NO GROWTH Performed at [facility], [place] [address], [place], [place] [address]    Report Status 01/06/2023 FINAL  Final  Culture, Respiratory w Gram Stain     Status: None (Preliminary result)   Collection Time: 01/08/23 12:09 PM   Specimen: Bronchoalveolar Lavage; Respiratory  Result Value Ref Range Status   Specimen Description   Final    BRONCHIAL ALVEOLAR LAVAGE Performed at [facility], [address], [place], [place] [address]    Special Requests   Final    NONE Performed at [facility], [place], [place] [address]    Gram Stain   Final    ABUNDANT GRAM POSITIVE COCCI IN PAIRS IN CHAINS RARE GRAM NEGATIVE RODS FEW  WBC PRESENT, PREDOMINANTLY PMN    Culture   Final    ABUNDANT STAPHYLOCOCCUS AUREUS CULTURE REINCUBATED FOR BETTER GROWTH Performed at [facility], [place] [address], [place], [place] [address]    Report Status PENDING  Incomplete    Lab Basic Metabolic Panel: Recent Labs  Lab 01/04/23 0345 01/05/23 0422 01/05/23 0425 01/06/23 0408 01/07/23 0434 01/08/23 0415  NA 146* 144  --  137 136 137  K 4.1 3.9  --  4.0 4.0 4.1  CL 108 106  --  97* 98 99  CO2 28 28  --  30 29 31   GLUCOSE 182* 203*  --  180* 181* 161*  BUN 91* 92*  --  49* 51* 54*  CREATININE 1.63* 1.40*  --  0.79 0.93 1.00  CALCIUM 8.5* 8.4*  --  8.1* 8.5* 8.4*  MG 2.2  --  1.9 1.7 1.7 1.7  PHOS 2.6 2.9  --  2.1* 2.1* 3.9   Liver Function Tests: Recent Labs  Lab 01/04/23 0345 01/05/23 0422 01/06/23 0408 01/07/23 0434 01/08/23 0415  ALBUMIN 1.8* 1.7* 1.8* 2.1* 1.8*   No results for input(s): "LIPASE", "AMYLASE" in the last 168 hours. No results for input(s): "AMMONIA" in the last 168 hours. CBC: Recent  Labs  Lab 01/04/23 0345 01/05/23 0425 01/06/23 0408 01/07/23 0434 01/08/23 0415 01/08/23 2007  WBC 11.4* 12.1* 11.8* 10.4 10.1  --   HGB 8.2* 7.5* 7.5* 7.3* 6.5* 7.7*  HCT 26.2* 24.5* 24.8* 24.3* 21.6* 24.3*  MCV 102.3* 105.6* 106.0* 105.2* 104.3*  --   PLT 387 354 369 398 392  --    Cardiac Enzymes: No results for input(s): "CKTOTAL", "CKMB", "CKMBINDEX", "TROPONINI" in the last 168 hours. Sepsis Labs: Recent Labs  Lab 01/05/23 0425 01/06/23 0408 01/07/23 0434 01/08/23 0415  WBC 12.1* 11.8* 10.4 10.1    Procedures/Operations  01/08/23: Bronchoscopy 01/03/23: LP 01/03/23: Chest tube 01/03/23: Intubation 01/02/23: CVL placement      Rufina Falco, DNP, CCRN, FNP-C, AGACNP-BC Acute Care & Family Nurse Practitioner  [facility]  See Amion for personal pager PCCM on call pager [phone] until 7 am

## 2023-01-26 DEATH — deceased
